# Patient Record
Sex: Male | Born: 1975
Health system: Southern US, Community
[De-identification: ages and names within clinical notes are randomized; demographics above are authoritative.]

## PROBLEM LIST (undated history)

## (undated) ENCOUNTER — Emergency Department (HOSPITAL_COMMUNITY): Admission: EM | Payer: BLUE CROSS/BLUE SHIELD | Source: Home / Self Care

## (undated) DIAGNOSIS — E1110 Type 2 diabetes mellitus with ketoacidosis without coma: Secondary | ICD-10-CM

## (undated) DIAGNOSIS — Z8614 Personal history of Methicillin resistant Staphylococcus aureus infection: Secondary | ICD-10-CM

## (undated) DIAGNOSIS — M14679 Charcot's joint, unspecified ankle and foot: Secondary | ICD-10-CM

## (undated) DIAGNOSIS — E109 Type 1 diabetes mellitus without complications: Secondary | ICD-10-CM

## (undated) DIAGNOSIS — T8859XA Other complications of anesthesia, initial encounter: Secondary | ICD-10-CM

## (undated) DIAGNOSIS — J45909 Unspecified asthma, uncomplicated: Secondary | ICD-10-CM

## (undated) DIAGNOSIS — I10 Essential (primary) hypertension: Secondary | ICD-10-CM

## (undated) DIAGNOSIS — S83209A Unspecified tear of unspecified meniscus, current injury, unspecified knee, initial encounter: Secondary | ICD-10-CM

## (undated) DIAGNOSIS — J189 Pneumonia, unspecified organism: Secondary | ICD-10-CM

## (undated) DIAGNOSIS — R0602 Shortness of breath: Secondary | ICD-10-CM

## (undated) DIAGNOSIS — K219 Gastro-esophageal reflux disease without esophagitis: Secondary | ICD-10-CM

## (undated) DIAGNOSIS — T4145XA Adverse effect of unspecified anesthetic, initial encounter: Secondary | ICD-10-CM

## (undated) DIAGNOSIS — E7800 Pure hypercholesterolemia, unspecified: Secondary | ICD-10-CM

## (undated) HISTORY — DX: Gastro-esophageal reflux disease without esophagitis: K21.9

## (undated) HISTORY — DX: Type 1 diabetes mellitus without complications: E10.9

## (undated) HISTORY — DX: Type 2 diabetes mellitus with ketoacidosis without coma: E11.10

## (undated) HISTORY — DX: Charcot's joint, unspecified ankle and foot: M14.679

## (undated) HISTORY — PX: KNEE ARTHROSCOPY: SHX127

## (undated) HISTORY — PX: HERNIA REPAIR: SHX51

## (undated) HISTORY — PX: ABDOMINAL SURGERY: SHX537

## (undated) HISTORY — PX: FRACTURE SURGERY: SHX138

## (undated) HISTORY — DX: Essential (primary) hypertension: I10

---

## 2001-12-03 ENCOUNTER — Emergency Department (HOSPITAL_COMMUNITY): Admission: EM | Admit: 2001-12-03 | Discharge: 2001-12-03 | Payer: Self-pay | Admitting: Emergency Medicine

## 2003-05-13 ENCOUNTER — Emergency Department (HOSPITAL_COMMUNITY): Admission: EM | Admit: 2003-05-13 | Discharge: 2003-05-13 | Payer: Self-pay | Admitting: Family Medicine

## 2004-01-13 ENCOUNTER — Encounter: Admission: RE | Admit: 2004-01-13 | Discharge: 2004-04-12 | Payer: Self-pay | Admitting: Family Medicine

## 2004-04-02 ENCOUNTER — Emergency Department (HOSPITAL_COMMUNITY): Admission: EM | Admit: 2004-04-02 | Discharge: 2004-04-02 | Payer: Self-pay | Admitting: Family Medicine

## 2005-11-05 ENCOUNTER — Emergency Department (HOSPITAL_COMMUNITY): Admission: EM | Admit: 2005-11-05 | Discharge: 2005-11-05 | Payer: Self-pay | Admitting: Emergency Medicine

## 2006-01-05 ENCOUNTER — Encounter: Admission: RE | Admit: 2006-01-05 | Discharge: 2006-01-05 | Payer: Self-pay | Admitting: Family Medicine

## 2006-09-18 ENCOUNTER — Encounter: Admission: RE | Admit: 2006-09-18 | Discharge: 2006-09-18 | Payer: Self-pay | Admitting: Orthopedic Surgery

## 2007-06-01 ENCOUNTER — Encounter: Admission: RE | Admit: 2007-06-01 | Discharge: 2007-06-01 | Payer: Self-pay | Admitting: Family Medicine

## 2007-06-16 ENCOUNTER — Encounter: Admission: RE | Admit: 2007-06-16 | Discharge: 2007-06-16 | Payer: Self-pay | Admitting: Family Medicine

## 2007-08-26 ENCOUNTER — Emergency Department (HOSPITAL_COMMUNITY): Admission: EM | Admit: 2007-08-26 | Discharge: 2007-08-26 | Payer: Self-pay | Admitting: Family Medicine

## 2008-11-06 ENCOUNTER — Ambulatory Visit: Payer: Self-pay | Admitting: Vascular Surgery

## 2008-11-06 ENCOUNTER — Ambulatory Visit: Admission: RE | Admit: 2008-11-06 | Discharge: 2008-11-06 | Payer: Self-pay | Admitting: Orthopedic Surgery

## 2008-11-06 ENCOUNTER — Encounter (INDEPENDENT_AMBULATORY_CARE_PROVIDER_SITE_OTHER): Payer: Self-pay | Admitting: Orthopedic Surgery

## 2009-04-30 ENCOUNTER — Emergency Department (HOSPITAL_COMMUNITY): Admission: EM | Admit: 2009-04-30 | Discharge: 2009-04-30 | Payer: Self-pay | Admitting: Emergency Medicine

## 2010-01-31 ENCOUNTER — Encounter: Payer: Self-pay | Admitting: Family Medicine

## 2010-03-21 ENCOUNTER — Inpatient Hospital Stay (INDEPENDENT_AMBULATORY_CARE_PROVIDER_SITE_OTHER)
Admission: RE | Admit: 2010-03-21 | Discharge: 2010-03-21 | Disposition: A | Discharge status: Home / Self Care | Payer: BC Managed Care – PPO | Source: Ambulatory Visit | Attending: Emergency Medicine | Admitting: Emergency Medicine

## 2010-03-21 ENCOUNTER — Ambulatory Visit (INDEPENDENT_AMBULATORY_CARE_PROVIDER_SITE_OTHER): Payer: BC Managed Care – PPO

## 2010-03-21 DIAGNOSIS — S6390XA Sprain of unspecified part of unspecified wrist and hand, initial encounter: Secondary | ICD-10-CM

## 2010-03-30 LAB — POCT H PYLORI SCREEN: H. PYLORI SCREEN, POC: NEGATIVE

## 2010-05-23 ENCOUNTER — Emergency Department (HOSPITAL_COMMUNITY): Payer: BC Managed Care – PPO

## 2010-05-23 ENCOUNTER — Inpatient Hospital Stay (HOSPITAL_COMMUNITY)
Admission: EM | Admit: 2010-05-23 | Discharge: 2010-05-25 | DRG: 295 | Disposition: A | Discharge status: Home / Self Care | Payer: BC Managed Care – PPO | Attending: Internal Medicine | Admitting: Internal Medicine

## 2010-05-23 DIAGNOSIS — E1010 Type 1 diabetes mellitus with ketoacidosis without coma: Principal | ICD-10-CM | POA: Diagnosis present

## 2010-05-23 DIAGNOSIS — Z794 Long term (current) use of insulin: Secondary | ICD-10-CM

## 2010-05-23 DIAGNOSIS — K219 Gastro-esophageal reflux disease without esophagitis: Secondary | ICD-10-CM | POA: Diagnosis present

## 2010-05-23 DIAGNOSIS — E871 Hypo-osmolality and hyponatremia: Secondary | ICD-10-CM | POA: Diagnosis present

## 2010-05-23 DIAGNOSIS — E785 Hyperlipidemia, unspecified: Secondary | ICD-10-CM | POA: Diagnosis present

## 2010-05-23 DIAGNOSIS — D72829 Elevated white blood cell count, unspecified: Secondary | ICD-10-CM | POA: Diagnosis present

## 2010-05-23 LAB — COMPREHENSIVE METABOLIC PANEL
Calcium: 9.4 mg/dL (ref 8.4–10.5)
Chloride: 86 mEq/L — ABNORMAL LOW (ref 96–112)
GFR calc non Af Amer: >60 mL/min (ref >60)
Glucose, Bld: 540 mg/dL — ABNORMAL HIGH (ref 70–99)
Sodium: 129 mEq/L — ABNORMAL LOW (ref 135–145)
Total Bilirubin: 0.4 mg/dL (ref 0.3–1.2)

## 2010-05-23 LAB — LIPID PANEL
Cholesterol: 231 mg/dL — ABNORMAL HIGH (ref 0–200)
Triglycerides: 143 mg/dL (ref <150)
VLDL: 29 mg/dL (ref 0–40)

## 2010-05-23 LAB — URINE MICROSCOPIC-ADD ON

## 2010-05-23 LAB — URINALYSIS, ROUTINE W REFLEX MICROSCOPIC
Bilirubin Urine: NEGATIVE
Hgb urine dipstick: NEGATIVE
Ketones, ur: >80 mg/dL — AB
Leukocytes, UA: NEGATIVE
Protein, ur: 30 mg/dL — AB

## 2010-05-23 LAB — BASIC METABOLIC PANEL
Chloride: 100 mEq/L (ref 96–112)
GFR calc non Af Amer: >60 mL/min (ref >60)
Glucose, Bld: 227 mg/dL — ABNORMAL HIGH (ref 70–99)
Potassium: 5 mEq/L (ref 3.5–5.1)
Sodium: 135 mEq/L (ref 135–145)

## 2010-05-23 LAB — GLUCOSE, CAPILLARY
Glucose-Capillary: 562 mg/dL (ref 70–99)
Glucose-Capillary: 398 mg/dL — ABNORMAL HIGH (ref 70–99)

## 2010-05-23 LAB — CBC
HCT: 43.8 % (ref 39.0–52.0)
Hemoglobin: 14.8 g/dL (ref 13.0–17.0)
MCH: 31.2 pg (ref 26.0–34.0)
MCHC: 33.8 g/dL (ref 30.0–36.0)
MCV: 92.2 fL (ref 78.0–100.0)
Platelets: 284 10*3/uL (ref 150–400)

## 2010-05-23 LAB — LIPASE, BLOOD: Lipase: 10 U/L — ABNORMAL LOW (ref 11–59)

## 2010-05-23 LAB — DIFFERENTIAL
Basophils Absolute: 0 10*3/uL (ref 0.0–0.1)
Basophils Relative: 0 % (ref 0–1)
Eosinophils Absolute: 0 10*3/uL (ref 0.0–0.7)
Eosinophils Relative: 0 % (ref 0–5)
Lymphocytes Relative: 4 % — ABNORMAL LOW (ref 12–46)
Monocytes Relative: 7 % (ref 3–12)
Neutro Abs: 18.4 10*3/uL — ABNORMAL HIGH (ref 1.7–7.7)
Neutrophils Relative %: 89 % — ABNORMAL HIGH (ref 43–77)

## 2010-05-23 LAB — MRSA PCR SCREENING: MRSA by PCR: NEGATIVE

## 2010-05-23 LAB — POCT CARDIAC MARKERS
CKMB, poc: <1 ng/mL — ABNORMAL LOW (ref 1.0–8.0)
Myoglobin, poc: 37.7 ng/mL (ref 12–200)
Troponin i, poc: <0.05 ng/mL (ref 0.00–0.09)

## 2010-05-23 LAB — CK TOTAL AND CKMB (NOT AT ARMC): CK, MB: 1.8 ng/mL (ref 0.3–4.0)

## 2010-05-23 LAB — TROPONIN I: Troponin I: <0.3 ng/mL (ref <0.30)

## 2010-05-23 LAB — POCT I-STAT 3, ART BLOOD GAS (G3+): O2 Saturation: 97 %

## 2010-05-23 LAB — D-DIMER, QUANTITATIVE: D-Dimer, Quant: <0.22 ug/mL-FEU (ref 0.00–0.48)

## 2010-05-23 LAB — RAPID STREP SCREEN (MED CTR MEBANE ONLY): Streptococcus, Group A Screen (Direct): NEGATIVE

## 2010-05-24 ENCOUNTER — Inpatient Hospital Stay (HOSPITAL_COMMUNITY): Payer: BC Managed Care – PPO

## 2010-05-24 LAB — CARDIAC PANEL(CRET KIN+CKTOT+MB+TROPI)
CK, MB: 1.7 ng/mL (ref 0.3–4.0)
Relative Index: INVALID (ref 0.0–2.5)
Troponin I: <0.3 ng/mL (ref <0.30)
Troponin I: <0.3 ng/mL (ref <0.30)

## 2010-05-24 LAB — GLUCOSE, CAPILLARY
Glucose-Capillary: 189 mg/dL — ABNORMAL HIGH (ref 70–99)
Glucose-Capillary: 191 mg/dL — ABNORMAL HIGH (ref 70–99)
Glucose-Capillary: 210 mg/dL — ABNORMAL HIGH (ref 70–99)
Glucose-Capillary: 173 mg/dL — ABNORMAL HIGH (ref 70–99)
Glucose-Capillary: 127 mg/dL — ABNORMAL HIGH (ref 70–99)
Glucose-Capillary: 127 mg/dL — ABNORMAL HIGH (ref 70–99)
Glucose-Capillary: 160 mg/dL — ABNORMAL HIGH (ref 70–99)
Glucose-Capillary: 186 mg/dL — ABNORMAL HIGH (ref 70–99)
Glucose-Capillary: 144 mg/dL — ABNORMAL HIGH (ref 70–99)

## 2010-05-24 LAB — BASIC METABOLIC PANEL
BUN: 12 mg/dL (ref 6–23)
BUN: 18 mg/dL (ref 6–23)
BUN: 10 mg/dL (ref 6–23)
CO2: 17 mEq/L — ABNORMAL LOW (ref 19–32)
CO2: 6 mEq/L — CL (ref 19–32)
CO2: 20 mEq/L (ref 19–32)
Calcium: 8.2 mg/dL — ABNORMAL LOW (ref 8.4–10.5)
Chloride: 103 mEq/L (ref 96–112)
Chloride: 103 mEq/L (ref 96–112)
Chloride: 106 mEq/L (ref 96–112)
Creatinine, Ser: 1.02 mg/dL (ref 0.4–1.5)
Creatinine, Ser: 0.84 mg/dL (ref 0.4–1.5)
Creatinine, Ser: 1.18 mg/dL (ref 0.4–1.5)
GFR calc Af Amer: >60 mL/min (ref >60)
GFR calc Af Amer: >60 mL/min (ref >60)
GFR calc non Af Amer: >60 mL/min (ref >60)
Glucose, Bld: 153 mg/dL — ABNORMAL HIGH (ref 70–99)
Glucose, Bld: 193 mg/dL — ABNORMAL HIGH (ref 70–99)
Potassium: 3.6 mEq/L (ref 3.5–5.1)
Sodium: 133 mEq/L — ABNORMAL LOW (ref 135–145)

## 2010-05-24 LAB — CBC
HCT: 39.5 % (ref 39.0–52.0)
Hemoglobin: 14 g/dL (ref 13.0–17.0)
RBC: 4.45 MIL/uL (ref 4.22–5.81)

## 2010-05-24 LAB — HEMOGLOBIN A1C: Mean Plasma Glucose: 295 mg/dL — ABNORMAL HIGH (ref <117)

## 2010-05-24 LAB — MAGNESIUM: Magnesium: 2.2 mg/dL (ref 1.5–2.5)

## 2010-05-24 LAB — URINE CULTURE
Colony Count: NO GROWTH
Culture: NO GROWTH

## 2010-05-25 LAB — GLUCOSE, CAPILLARY
Glucose-Capillary: 263 mg/dL — ABNORMAL HIGH (ref 70–99)
Glucose-Capillary: 291 mg/dL — ABNORMAL HIGH (ref 70–99)

## 2010-05-25 LAB — BASIC METABOLIC PANEL
Chloride: 100 mEq/L (ref 96–112)
Creatinine, Ser: 0.85 mg/dL (ref 0.4–1.5)
GFR calc Af Amer: >60 mL/min (ref >60)

## 2010-05-25 LAB — CBC
MCH: 31.4 pg (ref 26.0–34.0)
Platelets: 194 10*3/uL (ref 150–400)
RBC: 4.33 MIL/uL (ref 4.22–5.81)
WBC: 4.9 10*3/uL (ref 4.0–10.5)

## 2010-05-27 NOTE — H&P (Signed)
NAME:  Michael Hutchinson, Michael Hutchinson NO.:  [id number]  MEDICAL RECORD NO.:  [id number]           PATIENT TYPE:  E  LOCATION:  [facility]                         FACILITY:  [facility]  PHYSICIAN:  Eduard Clos, MDDATE OF BIRTH:  1975-12-29  DATE OF ADMISSION:  05/23/2010 DATE OF DISCHARGE:                             HISTORY & PHYSICAL   PRIMARY CARE PHYSICIAN:  Gretta Arab. Valentina Lucks, MD  CHIEF COMPLAINT:  Nausea, vomiting, and chest pain.  HISTORY OF PRESENT ILLNESS:  A 35 year old male with history of diabetes mellitus type 1 since age 53, has been experiencing some nausea, vomiting and multiple episodes of sneezing with chest pain since morning.  The chest pain is on left side pleuritic in nature, this has become persistent.  The patient denies any abdominal pain or diarrhea.  Denies any fever, chills, cough, or phlegm.  Has the feeling was sore throat like.  Denies any dizziness, loss of consciousness, or any focal deficit.  Denies any dysuria, discharge, or diarrhea.  In the ER, the patient was found to be in DKA with anion gap of 38 with a bicarb of 5.  ABG shows the pH of 7.1.  At this time, the patient is admitted for DKA.  His cardiac enzymes have been negative.  EKG shows nonspecific finding.  PAST MEDICAL HISTORY:  Diabetes mellitus type 1.  PAST SURGICAL HISTORY:  Arthroscopic procedure for left knee.  ADMITTING MEDICATIONS:  Lantus insulin 48-50 units at bedtime, but at times takes early in the morning.  SOCIAL HISTORY:  The patient lives with his parents.  Denies smoking cigarette, drinking alcohol or use illegal drugs.  ALLERGIES:  No known drug allergies.  FAMILY HISTORY:  Significant for diabetes in grandparents.  REVIEW OF SYSTEMS:  As per history of present illness, nothing else significant.  PHYSICAL EXAMINATION:  GENERAL:  The patient is examined at bedside, not in acute distress. VITAL SIGNS:  Blood pressure is 132/44, pulse 120 per  minute, temperature 98.7, respirations 22 breaths, and O2 sat is 100%. HEENT:  Anicteric.  No pallor or discharge from ears, eyes, or mouth.  I do not see any obvious exudates in the throat.  Tongue is midline and dry.  No facial asymmetry.  No neck vein JVD.  No discharge from ears, eyes, nose, or mouth. CHEST:  Bilateral air entry present.  No rhonchi.  No crepitation. HEART:  S1, S2 heard. ABDOMEN:  Soft, nontender.  Bowel sounds heard. NEUROLOGIC:  The patient is alert, awake, and oriented to time an person.  Moves upper and lower extremities 5/5.  Peripheral pulses good. No edema.  LABORATORY DATA:  EKG shows normal sinus rhythm with heart rate around 127 beats per minute with nonspecific ST changes with R-wave progression.  ABG; pH is 7.103, pCO2 of 13.9, pO2 is 116, oxygen saturation is 97%.  CBC; WBC is 20.6, hemoglobin is 14.8, hematocrit 43.8, and platelets 284, neutrophils 89%.  Complete metabolic panel; sodium 129, potassium 5.4, chloride 86, carbon dioxide 5, glucose 540, BUN 23, creatinine 1.1, total bilirubin is 0.45, phosphatase 136.  AST 15, ALT 15, total protein 7.7, albumin  4.3, calcium 9.9, lipase 10, CK- MB less than 1, troponin less than 505, myoglobin 37.7.  ASSESSMENT: 1. Diabetic ketoacidosis. 2. Atypical chest pain, pleuritic in nature. 3. Hyponatremia probably from dehydration and hyperglycemia.  No     nausea or vomiting.  PLAN: 1. At this time, we will admit the patient to Intensive Care Unit. 2. For his DKA, the patient will be kept n.p.o.  We will follow DKA     protocol with aggressive hydration and IV insulin per protocol with     frequent B-MET check and correction of his potassium accordingly. 3. For his nausea and vomiting at this time we will place the patient     on antiemetics.  The abdomen appears benign and he is not     complaining of any abdominal pain.  If he still has persistent     nausea and vomiting at this time, we will consider  CAT scan. 4. Leukocytosis that seems to be from a stress with DKA.  The patient     does complain of multiple healing episodes with some sore throat,     "I think I have got a strep throat."  I think the patient is also     having pleuritic chest pain, am not sure if he had aspirated or     not.  At this time, I will place the patient empirically on     clindamycin.  I am going to repeat the chest x-ray in a.m.  Based     on his clinical condition, if he needs to be continued on     clindamycin or not. 5. For his chest pain which at this time is atypical, we will try the     cardiac markers and we will get 2-D echo.     Eduard Clos, MD     ANK/MEDQ  D:  05/23/2010  T:  05/23/2010  Job:  [id number]  cc:   Gretta Arab. Valentina Lucks, M.D.  Electronically Signed by Midge Minium MD on 05/27/2010 07:16:20 AM

## 2010-05-30 LAB — CULTURE, BLOOD (ROUTINE X 2)
Culture  Setup Time: 201205140002
Culture: NO GROWTH
Culture: NO GROWTH

## 2010-06-03 NOTE — Discharge Summary (Signed)
NAME:  Michael Hutchinson, Michael Hutchinson NO.:  [id number]  MEDICAL RECORD NO.:  [id number]           PATIENT TYPE:  I  LOCATION:  4508                         FACILITY:  [facility]  PHYSICIAN:  Jeoffrey Massed, MD    DATE OF BIRTH:  [birth year]  DATE OF ADMISSION:  05/23/2010 DATE OF DISCHARGE:                        DISCHARGE SUMMARY - REFERRING   PRIMARY CARE PRACTITIONER:  Gretta Arab. Valentina Lucks, MD  PRIMARY DISCHARGE DIAGNOSES: 1. Diabetic ketoacidosis, now resolved. 2. Gastroesophageal reflux disease. 3. Dyslipidemia.  DISCHARGE DIAGNOSIS:  Type 2 diabetes.  DISCHARGE MEDICATIONS: 1. Crestor 10 mg 1 tablet p.o. daily at bedtime. 2. Protonix 40 mg 1 tablet p.o. daily. 3. Lantus 48-50 units subcutaneously at bedtime. 4. NovoLog insulin sliding scale three times a day as needed.  CONSULTATIONS:  None.  BRIEF HISTORY OF PRESENT ILLNESS:  The patient is a very pleasant 35- year-old male who presented with nausea, vomiting and chest pain who was found to have diabetic ketoacidosis with the ABG showing a pH of 7.1. The patient was then admitted to the ICU for further treatment.  For further details, please see the history and physical that was dictated by Dr. Toniann Fail on admission.  PERTINENT LABORATORY DATA: 1. HbA1c is 11.9. 2. Urine cultures negative. 3. Rapid strep screen was negative. 4. Lipid profile showed an LDL cholesterol of 164. 5. D-dimer was negative. 6. Blood cultures are negative so far.  PERTINENT RADIOLOGICAL STUDIES:  Portable chest x-ray showed no active disease of the chest.  BRIEF HOSPITAL COURSE: 1. Diabetic ketoacidosis.  The patient was admitted with nausea and     vomiting along with chest pain.  Further evaluation showed that     this patient to be in severe diabetic ketoacidosis.  The patient     was hydrated, put on an insulin drip and admitted to the ICU.  With     fluids and with insulin drip, the patient's condition improved.  He     was  also briefly kept n.p.o. and given supportive therapy with     antiemetics for his nausea and vomiting.  He was slowly started on     clear liquid diet and advance as tolerated.  Subsequent to his     anion gap closing, his insulin drip was discontinued and the     patient was then transitioned to subcutaneous insulin along with     sliding scale coverage.  Overnight, he has done well and is now     tolerating a regular diet with no further nausea, vomiting.  His     chest pain has also resolved.  The patient's HbA1c is significantly     elevated and he will follow up with Dr. Valentina Lucks in an outpatient     setting for further optimization of his insulin regimen. 2. Dyslipidemia.  His LDL cholesterol is elevated and he will now be     started on Crestor as noted above. 3. Per the patient and the patient's family, the patient has been     having a lot of reflux like symptoms over the past few weeks and he  will now be started on Protonix.  Further workup including GI     consultation will be referred to the discretion of his primary care    practitioner.  DISPOSITION:  This patient is stable to be discharged home.  FOLLOWUP INSTRUCTIONS:  The patient has been instructed to follow up with Dr. Valentina Lucks within 5 days upon discharge.  He is to call and make an appointment.  Total time spent coordinating discharge 45 minutes.     Jeoffrey Massed, MD     SG/MEDQ  D:  05/25/2010  T:  05/25/2010  Job:  [id number]  cc:   Gretta Arab. Valentina Lucks, M.D.  Electronically Signed by Jeoffrey Massed  on 06/03/2010 02:17:50 PM

## 2010-11-11 ENCOUNTER — Encounter: Payer: Self-pay | Admitting: Gastroenterology

## 2010-11-24 ENCOUNTER — Encounter: Payer: Self-pay | Admitting: *Deleted

## 2010-11-30 ENCOUNTER — Telehealth: Payer: Self-pay | Admitting: Gastroenterology

## 2010-11-30 ENCOUNTER — Ambulatory Visit: Payer: BC Managed Care – PPO | Admitting: Gastroenterology

## 2010-11-30 NOTE — Telephone Encounter (Signed)
Per Dr Jarold Motto - Do Not Reschedule with him/yf

## 2010-12-09 ENCOUNTER — Encounter (HOSPITAL_COMMUNITY): Payer: Self-pay | Admitting: Emergency Medicine

## 2010-12-09 ENCOUNTER — Emergency Department (INDEPENDENT_AMBULATORY_CARE_PROVIDER_SITE_OTHER)
Admission: EM | Admit: 2010-12-09 | Discharge: 2010-12-09 | Disposition: A | Discharge status: Home / Self Care | Payer: BC Managed Care – PPO | Source: Home / Self Care | Attending: Emergency Medicine | Admitting: Emergency Medicine

## 2010-12-09 DIAGNOSIS — M25562 Pain in left knee: Secondary | ICD-10-CM

## 2010-12-09 DIAGNOSIS — M25569 Pain in unspecified knee: Secondary | ICD-10-CM

## 2010-12-09 HISTORY — DX: Unspecified tear of unspecified meniscus, current injury, unspecified knee, initial encounter: S83.209A

## 2010-12-09 MED ORDER — IBUPROFEN 600 MG PO TABS: 600.0000 mg | ORAL_TABLET | Freq: Four times a day (QID) | ORAL | Status: AC | PRN

## 2010-12-09 MED ORDER — HYDROCODONE-ACETAMINOPHEN 5-325 MG PO TABS: 2.0000 | ORAL_TABLET | ORAL | Status: AC | PRN

## 2010-12-09 NOTE — ED Notes (Signed)
Pain in leg (left) .  Left knee pain last night, pain running down anterior lower leg.  Some pain in bend of stomach,  No new injury.  Has had pain in the past, but today worse than usual.  Today pain has extended in anterior thigh.

## 2010-12-09 NOTE — ED Provider Notes (Addendum)
History     CSN: [id number] Arrival date & time: 12/09/2010  9:21 PM   First MD Initiated Contact with Patient 12/09/10 2058      Chief Complaint  Patient presents with  . Leg Pain    HPI Comments: Pt s/p meniscal injury left knee with arthroscopy reports knee "giving out" several days ago. Noticed swelling, pain starting last night. No other recent trauma to knee. No change in physical activity.  No redness, fevers, parasthesias, crepitus.  Pain worse with walking. Tried tylenol with some relief. No h/o anticoagulant or antiplatelet use. Similar pain before prior to meniscal debridement. Glucose running at baseline per pt.  Patient is a 35 y.o. male presenting with leg pain. The history is provided by the patient.  Leg Pain  The incident occurred yesterday. There was no injury mechanism. The pain is present in the left knee. The quality of the pain is described as throbbing. The pain has been constant since onset. Associated symptoms include muscle weakness. Pertinent negatives include no numbness, no inability to bear weight, no loss of motion, no loss of sensation and no tingling. He reports no foreign bodies present. The symptoms are aggravated by activity, bearing weight and palpation. He has tried acetaminophen for the symptoms. The treatment provided moderate relief.    Past Medical History  Diagnosis Date  . Diabetes mellitus   . Diabetic ketoacidosis   . GERD (gastroesophageal reflux disease)   . Meniscus tear     Past Surgical History  Procedure Date  . Knee arthroscopy     History reviewed. No pertinent family history.  History  Substance Use Topics  . Smoking status: Never Smoker   . Smokeless tobacco: Not on file  . Alcohol Use: No      Review of Systems  Constitutional: Negative for fever.  Gastrointestinal: Negative for nausea and vomiting.  Musculoskeletal: Positive for joint swelling, arthralgias and gait problem. Negative for back pain.  Skin:  Negative for color change and rash.  Neurological: Negative for tingling, weakness and numbness.  Hematological: Does not bruise/bleed easily.    Allergies  Review of patient's allergies indicates no known allergies.  Home Medications   Current Outpatient Rx  Name Route Sig Dispense Refill  . ACETAMINOPHEN 500 MG PO TABS Oral Take 500 mg by mouth every 6 (six) hours as needed.      . INSULIN GLARGINE 100 UNIT/ML [place] SOLN Subcutaneous Inject into the skin at bedtime.      . INSULIN LISPRO (HUMAN) 100 UNIT/ML [place] SOLN Subcutaneous Inject into the skin 3 (three) times daily before meals.      Marland Kitchen HYDROCODONE-ACETAMINOPHEN 5-325 MG PO TABS Oral Take 2 tablets by mouth every 4 (four) hours as needed for pain. 20 tablet 0  . IBUPROFEN 600 MG PO TABS Oral Take 1 tablet (600 mg total) by mouth every 6 (six) hours as needed for pain. 30 tablet 0    BP 114/68  Pulse 108  Temp(Src) 98.2 F (36.8 C) (Oral)  Resp 20  SpO2 98%  Physical Exam  Nursing note and vitals reviewed. Constitutional: He is oriented to person, place, and time. He appears well-developed and well-nourished.  HENT:  Head: Normocephalic and atraumatic.  Eyes: Conjunctivae and EOM are normal. Pupils are equal, round, and reactive to light.  Neck: Normal range of motion.  Cardiovascular: Regular rhythm.   Pulmonary/Chest: Effort normal. No respiratory distress.  Abdominal: He exhibits no distension.  Musculoskeletal: Normal range of motion.  Trace effusion.. No warmth, redness. Left Knee ROM slightly decreased due to pain, Flexion/extension  intact. , Patella NT, Patellar apprehension test negative, Patellar tendon NT, Medial joint NT, Lateral joint  tender, Popliteal region NT, Lachman's stable, Varus stress testing stable but painful, Valgus stress testing stable, McMurray's testing abnormal, distal NVI with intact baseline sensation / motor / pulse distal to knee.   Neurological: He is alert and oriented to person,  place, and time.  Skin: Skin is warm and dry.  Psychiatric: He has a normal mood and affect. His behavior is normal.    ED Course  Procedures (including critical care time)  Labs Reviewed - No data to display No results found.   1. Knee pain, left       MDM  No evidence of septic arthritis. H/o meniscal injury in this knee s/p debridement. H&p most c/w repeat injury. No bony tenderness or history concerning for fx. Quadriceps mildly tender but pt with antalgic gait suspect altered gait mechanics causing thigh pain. Trace effusion. Joint stable. Does not meet [place] criteria for imaging. Deferring imaging. Will immobilize, ice, rest, crutches WBAT, nsaids/ norco prn, will f/u with Dr. Sherlean Foot who originally did surgery on pt's knee.   Luiz Blare, MD 12/09/10 4782  Luiz Blare, MD 12/09/10 [phone]

## 2010-12-20 ENCOUNTER — Emergency Department (INDEPENDENT_AMBULATORY_CARE_PROVIDER_SITE_OTHER)
Admission: EM | Admit: 2010-12-20 | Discharge: 2010-12-20 | Disposition: A | Discharge status: Home / Self Care | Payer: BC Managed Care – PPO | Source: Home / Self Care | Attending: Emergency Medicine | Admitting: Emergency Medicine

## 2010-12-20 ENCOUNTER — Emergency Department (INDEPENDENT_AMBULATORY_CARE_PROVIDER_SITE_OTHER): Payer: BC Managed Care – PPO

## 2010-12-20 ENCOUNTER — Encounter (HOSPITAL_COMMUNITY): Payer: Self-pay | Admitting: Emergency Medicine

## 2010-12-20 DIAGNOSIS — R6889 Other general symptoms and signs: Secondary | ICD-10-CM

## 2010-12-20 MED ORDER — TRAMADOL HCL 50 MG PO TABS: 100.0000 mg | ORAL_TABLET | Freq: Three times a day (TID) | ORAL | Status: AC | PRN

## 2010-12-20 MED ORDER — PREDNISONE 10 MG PO TABS: ORAL_TABLET | ORAL | Status: DC

## 2010-12-20 MED ORDER — GUAIFENESIN-CODEINE 100-10 MG/5ML PO SYRP: 10.0000 mL | ORAL_SOLUTION | Freq: Four times a day (QID) | ORAL | Status: AC | PRN

## 2010-12-20 NOTE — ED Provider Notes (Signed)
History     CSN: [id number] Arrival date & time: 12/20/2010  7:15 PM   First MD Initiated Contact with Patient 12/20/10 1824      Chief Complaint  Patient presents with  . URI    (Consider location/radiation/quality/duration/timing/severity/associated sxs/prior treatment) HPI Comments: Michael Hutchinson is a 35 year old insulin requiring diabetic who has had a four-day history of chest congestion, dry cough, wheezing, aching the chest, nasal congestion, headache, hoarseness, fever up to 103, and sweats. He has not had a flu vaccine. He's been exposed to his daughter who has a flulike illness. He's tried Alka-Seltzer and Delsym without relief. He rates his pain as a 6/10.  Patient is a 35 y.o. male presenting with URI.  URI The primary symptoms include fever and cough. Primary symptoms do not include fatigue, ear pain, sore throat, wheezing, abdominal pain, nausea, vomiting or rash.  Symptoms associated with the illness include congestion. The illness is not associated with chills or rhinorrhea.    Past Medical History  Diagnosis Date  . Diabetes mellitus   . Diabetic ketoacidosis   . GERD (gastroesophageal reflux disease)   . Meniscus tear     Past Surgical History  Procedure Date  . Knee arthroscopy     History reviewed. No pertinent family history.  History  Substance Use Topics  . Smoking status: Never Smoker   . Smokeless tobacco: Not on file  . Alcohol Use: No      Review of Systems  Constitutional: Positive for fever and diaphoresis. Negative for chills and fatigue.  HENT: Positive for congestion and voice change. Negative for ear pain, sore throat, rhinorrhea, sneezing, neck stiffness and postnasal drip.   Eyes: Negative for pain, discharge and redness.  Respiratory: Positive for cough. Negative for chest tightness, shortness of breath and wheezing.   Cardiovascular: Positive for chest pain.  Gastrointestinal: Negative for nausea, vomiting, abdominal pain and  diarrhea.  Skin: Negative for rash.    Allergies  Review of patient's allergies indicates no known allergies.  Home Medications   Current Outpatient Rx  Name Route Sig Dispense Refill  . ACETAMINOPHEN 500 MG PO TABS Oral Take 500 mg by mouth every 6 (six) hours as needed.      . INSULIN GLARGINE 100 UNIT/ML [place] SOLN Subcutaneous Inject 48 Units into the skin at bedtime.     . GUAIFENESIN-CODEINE 100-10 MG/5ML PO SYRP Oral Take 10 mLs by mouth 4 (four) times daily as needed for cough. 120 mL 0  . HYDROCODONE-ACETAMINOPHEN 5-325 MG PO TABS Oral Take 2 tablets by mouth every 4 (four) hours as needed for pain. 20 tablet 0  . IBUPROFEN 600 MG PO TABS Oral Take 1 tablet (600 mg total) by mouth every 6 (six) hours as needed for pain. 30 tablet 0  . INSULIN LISPRO (HUMAN) 100 UNIT/ML  SOLN Subcutaneous Inject into the skin 3 (three) times daily before meals.      . TRAMADOL HCL 50 MG PO TABS Oral Take 2 tablets (100 mg total) by mouth every 8 (eight) hours as needed for pain. Maximum dose= 8 tablets per day 30 tablet 0    BP 136/84  Pulse 108  Temp(Src) 98.9 F (37.2 C) (Oral)  Resp 18  SpO2 98%  Physical Exam  Nursing note and vitals reviewed. Constitutional: He appears well-developed and well-nourished. No distress.  HENT:  Head: Normocephalic and atraumatic.  Right Ear: External ear normal.  Left Ear: External ear normal.  Nose: Nose normal.  Mouth/Throat: Oropharynx is clear  and moist. No oropharyngeal exudate.  Eyes: Conjunctivae and EOM are normal. Pupils are equal, round, and reactive to light. Right eye exhibits no discharge. Left eye exhibits no discharge.  Neck: Normal range of motion. Neck supple.  Cardiovascular: Normal rate, regular rhythm and normal heart sounds.   Pulmonary/Chest: Effort normal and breath sounds normal. No stridor. No respiratory distress. He has no wheezes. He has no rales. He exhibits no tenderness.  Lymphadenopathy:    He has no cervical  adenopathy.  Skin: Skin is warm and dry. No rash noted. He is not diaphoretic.    ED Course  Procedures (including critical care time)  Labs Reviewed - No data to display Dg Chest 2 View  12/20/2010  *RADIOLOGY REPORT*  Clinical Data: Cough and fever  CHEST - 2 VIEW  Comparison: Chest radiograph 05/24/2010  Findings: The heart, mediastinal, and hilar contours are within normal limits and stable.  Trachea is midline.  Lung volumes slightly low.  No focal airspace disease, edema, effusion, or pneumothorax.  Bony thorax is unremarkable.  IMPRESSION: No acute cardiopulmonary disease.  Original Report Authenticated By: Britta Mccreedy, M.D.     1. Flu-like symptoms       MDM  He appears to have a flulike illness and he has not gotten the flu vaccine. His chest x-ray was negative, so will treat symptomatically.        Roque Lias, MD 12/20/10 [phone]

## 2010-12-20 NOTE — ED Notes (Signed)
Pt here with uri sx that started 3 dys ago.sx dry cough intermitt with chest congestion,loss of voice and h/a.pt has been taking otc alka seltzer,desylm but not working.temp on Friday 103.0 and resolved with tylenol

## 2010-12-22 ENCOUNTER — Ambulatory Visit: Payer: Worker's Compensation

## 2011-04-12 ENCOUNTER — Encounter (HOSPITAL_COMMUNITY): Payer: Self-pay | Admitting: Emergency Medicine

## 2011-04-12 ENCOUNTER — Emergency Department (HOSPITAL_COMMUNITY)
Admission: EM | Admit: 2011-04-12 | Discharge: 2011-04-12 | Disposition: A | Discharge status: Home / Self Care | Payer: BC Managed Care – PPO | Source: Home / Self Care | Attending: Emergency Medicine | Admitting: Emergency Medicine

## 2011-04-12 DIAGNOSIS — L02412 Cutaneous abscess of left axilla: Secondary | ICD-10-CM

## 2011-04-12 DIAGNOSIS — L0390 Cellulitis, unspecified: Secondary | ICD-10-CM

## 2011-04-12 DIAGNOSIS — IMO0002 Reserved for concepts with insufficient information to code with codable children: Secondary | ICD-10-CM

## 2011-04-12 DIAGNOSIS — L0291 Cutaneous abscess, unspecified: Secondary | ICD-10-CM

## 2011-04-12 MED ORDER — DOXYCYCLINE HYCLATE 100 MG PO CAPS: 100.0000 mg | ORAL_CAPSULE | Freq: Two times a day (BID) | ORAL | Status: DC

## 2011-04-12 NOTE — ED Notes (Signed)
Patient reports cbg this am 170"s.  It's "high" this morning.

## 2011-04-12 NOTE — ED Notes (Signed)
Knot under left axilla.  Does not have a pcp.  Patient reports knot started draining last night.  Some pain.  Reports " oozing yellow , nasty bad stuff".  History of the same.

## 2011-04-12 NOTE — ED Provider Notes (Signed)
History     CSN: [id number]  Arrival date & time 04/12/11  5621   First MD Initiated Contact with Patient 04/12/11 1015      Chief Complaint  Patient presents with  . Arm Pain    (Consider location/radiation/quality/duration/timing/severity/associated sxs/prior treatment) HPI Comments: Patient has been having swelling and a boil on his right armpit for more than a week, has been gotten progressively worse and painful and tender. Yesterday he decided to scrub the area vigorously and was able to open the abscess to drain and abundant pus today the swelling has come down markedly. Still soreness to losing pus with that order. No fevers. Patient describes have had similar infections in the past as he has diabetes.  Patient is a 36 y.o. male presenting with arm pain.  Arm Pain This is a new problem. The current episode started more than 1 week ago. The problem occurs constantly. The problem has been rapidly improving. Pertinent negatives include no chest pain. The symptoms are aggravated by twisting. The symptoms are relieved by rest. The treatment provided no relief.    Past Medical History  Diagnosis Date  . Diabetes mellitus   . Diabetic ketoacidosis   . GERD (gastroesophageal reflux disease)   . Meniscus tear     Past Surgical History  Procedure Date  . Knee arthroscopy     No family history on file.  History  Substance Use Topics  . Smoking status: Never Smoker   . Smokeless tobacco: Not on file  . Alcohol Use: No      Review of Systems  Constitutional: Negative for fever, chills and fatigue.  Cardiovascular: Negative for chest pain.  Skin: Negative for color change and rash.    Allergies  Review of patient's allergies indicates no known allergies.  Home Medications   Current Outpatient Rx  Name Route Sig Dispense Refill  . ACETAMINOPHEN 500 MG PO TABS Oral Take 500 mg by mouth every 6 (six) hours as needed.      Marland Kitchen DOXYCYCLINE HYCLATE 100 MG PO CAPS Oral  Take 1 capsule (100 mg total) by mouth 2 (two) times daily. 20 capsule 0  . INSULIN GLARGINE 100 UNIT/ML [place] SOLN Subcutaneous Inject 48 Units into the skin at bedtime.     . INSULIN LISPRO (HUMAN) 100 UNIT/ML [place] SOLN Subcutaneous Inject into the skin 3 (three) times daily before meals.        BP 122/73  Pulse 117  Temp(Src) 98.5 F (36.9 C) (Oral)  Resp 14  SpO2 100%  Physical Exam  Nursing note and vitals reviewed. Constitutional: He appears well-developed and well-nourished. He appears distressed.  HENT:  Head: Normocephalic.  Eyes: Conjunctivae are normal.  Neck: Neck supple.  Pulmonary/Chest: Effort normal.  Musculoskeletal: He exhibits tenderness.  Neurological: He is alert.  Skin: No rash noted. There is erythema.       ED Course  Procedures (including critical care time)  Labs Reviewed - No data to display No results found.   1. Abscess of axilla, left   2. Cellulitis       MDM  Left axillary abscess with spontaneous drainage. With digital pressure was able to evacuate abscess with abundant purulent exudate. Cellulitic component noted as well patient instructed to start with antibiotics        Jimmie Molly, MD 04/12/11 1058

## 2011-04-12 NOTE — Discharge Instructions (Signed)
Abscess An abscess (boil or furuncle) is an infected area under your skin. This area is filled with yellowish white fluid (pus). HOME CARE   Only take medicine as told by your doctor.   Keep the skin clean around your abscess. Keep clothes that may touch the abscess clean.   Change any bandages (dressings) as told by your doctor.   Avoid direct skin contact with other people. The infection can spread by skin contact with others.   Practice good hygiene and do not share personal care items.   Do not share athletic equipment, towels, or whirlpools. Shower after every practice or work out session.   If a draining area cannot be covered:   Do not play sports.   Children should not go to daycare until the wound has healed or until fluid (drainage) stops coming out of the wound.   See your doctor for a follow-up visit as told.  GET HELP RIGHT AWAY IF:   There is more pain, puffiness (swelling), and redness in the wound site.   There is fluid or bleeding from the wound site.   You have muscle aches, chills, fever, or feel sick.   You or your child has a temperature by mouth above 102 F (38.9 C), not controlled by medicine.   Your baby is older than 3 months with a rectal temperature of 102 F (38.9 C) or higher.  MAKE SURE YOU:   Understand these instructions.   Will watch your condition.   Will get help right away if you are not doing well or get worse.  Document Released: 06/15/2007 Document Revised: 12/16/2010 Document Reviewed: 06/15/2007 Exit[employer] Patient IAbscess An abscess (boil or furuncle) is an infected area that contains a collection of pus.  SYMPTOMS Signs and symptoms of an abscess include pain, tenderness, redness, or hardness. You may feel a moveable soft area under your skin. An abscess can occur anywhere in the body.  TREATMENT  A surgical cut (incision) may be made over your abscess to drain the pus. Gauze may be packed into the space or a drain may be  looped through the abscess cavity (pocket). This provides a drain that will allow the cavity to heal from the inside outwards. The abscess may be painful for a few days, but should feel much better if it was drained.  Your abscess, if seen early, may not have localized and may not have been drained. If not, another appointment may be required if it does not get better on its own or with medications. HOME CARE INSTRUCTIONS   Only take over-the-counter or prescription medicines for pain, discomfort, or fever as directed by your caregiver.   Take your antibiotics as directed if they were prescribed. Finish them even if you start to feel better.   Keep the skin and clothes clean around your abscess.   If the abscess was drained, you will need to use gauze dressing to collect any draining pus. Dressings will typically need to be changed 3 or more times a day.   The infection may spread by skin contact with others. Avoid skin contact as much as possible.   Practice good hygiene. This includes regular hand washing, cover any draining skin lesions, and do not share personal care items.   If you participate in sports, do not share athletic equipment, towels, whirlpools, or personal care items. Shower after every practice or tournament.   If a draining area cannot be adequately covered:   Do not participate in  sports.   Children should not participate in day care until the wound has healed or drainage stops.   If your caregiver has given you a follow-up appointment, it is very important to keep that appointment. Not keeping the appointment could result in a much worse infection, chronic or permanent injury, pain, and disability. If there is any problem keeping the appointment, you must call back to this facility for assistance.  SEEK MEDICAL CARE IF:   You develop increased pain, swelling, redness, drainage, or bleeding in the wound site.   You develop signs of generalized infection including  muscle aches, chills, fever, or a general ill feeling.   You have an oral temperature above 102 F (38.9 C).  MAKE SURE YOU:   Understand these instructions.   Will watch your condition.   Will get help right away if you are not doing well or get worse.  Document Released: 10/06/2004 Document Revised: 12/16/2010 Document Reviewed: 07/31/2007 [facility] Patient Information [address]nformation 2012 [place], [place].Abscess An abscess (boil or furuncle) is an infected area that contains a collection of pus.  SYMPTOMS Signs and symptoms of an abscess include pain, tenderness, redness, or hardness. You may feel a moveable soft area under your skin. An abscess can occur anywhere in the body.  TREATMENT  A surgical cut (incision) may be made over your abscess to drain the pus. Gauze may be packed into the space or a drain may be looped through the abscess cavity (pocket). This provides a drain that will allow the cavity to heal from the inside outwards. The abscess may be painful for a few days, but should feel much better if it was drained.  Your abscess, if seen early, may not have localized and may not have been drained. If not, another appointment may be required if it does not get better on its own or with medications. HOME CARE INSTRUCTIONS   Only take over-the-counter or prescription medicines for pain, discomfort, or fever as directed by your caregiver.   Take your antibiotics as directed if they were prescribed. Finish them even if you start to feel better.   Keep the skin and clothes clean around your abscess.   If the abscess was drained, you will need to use gauze dressing to collect any draining pus. Dressings will typically need to be changed 3 or more times a day.   The infection may spread by skin contact with others. Avoid skin contact as much as possible.   Practice good hygiene. This includes regular hand washing, cover any draining skin lesions, and do not share  personal care items.   If you participate in sports, do not share athletic equipment, towels, whirlpools, or personal care items. Shower after every practice or tournament.   If a draining area cannot be adequately covered:   Do not participate in sports.   Children should not participate in day care until the wound has healed or drainage stops.   If your caregiver has given you a follow-up appointment, it is very important to keep that appointment. Not keeping the appointment could result in a much worse infection, chronic or permanent injury, pain, and disability. If there is any problem keeping the appointment, you must call back to this facility for assistance.  SEEK MEDICAL CARE IF:   You develop increased pain, swelling, redness, drainage, or bleeding in the wound site.   You develop signs of generalized infection including muscle aches, chills, fever, or a general ill feeling.  You have an oral temperature above 102 F (38.9 C).  MAKE SURE YOU:   Understand these instructions.   Will watch your condition.   Will get help right away if you are not doing well or get worse.  Document Released: 10/06/2004 Document Revised: 12/16/2010 Document Reviewed: 07/31/2007 [facility] Patient Information 2012 [place], [place].

## 2011-04-21 ENCOUNTER — Emergency Department (INDEPENDENT_AMBULATORY_CARE_PROVIDER_SITE_OTHER)
Admission: EM | Admit: 2011-04-21 | Discharge: 2011-04-21 | Disposition: A | Discharge status: Home / Self Care | Payer: BC Managed Care – PPO | Source: Home / Self Care | Attending: Emergency Medicine | Admitting: Emergency Medicine

## 2011-04-21 ENCOUNTER — Encounter (HOSPITAL_COMMUNITY): Payer: Self-pay

## 2011-04-21 DIAGNOSIS — L0291 Cutaneous abscess, unspecified: Secondary | ICD-10-CM

## 2011-04-21 MED ORDER — SULFAMETHOXAZOLE-TRIMETHOPRIM 800-160 MG PO TABS: 1.0000 | ORAL_TABLET | Freq: Two times a day (BID) | ORAL | Status: AC

## 2011-04-21 NOTE — ED Provider Notes (Signed)
Medical screening examination/treatment/procedure(s) were performed by the resident and as supervising physician I was immediately available for consultation/collaboration.  Luiz Blare MD   Luiz Blare, MD 04/21/11 1048

## 2011-04-21 NOTE — ED Notes (Signed)
Pt states seen here a week ago for abscess to lt axilla that is draining.  Reports we put him on antibiotics, today is the last day of treatment and it is still draining large amounts of greenish-yellow pus.  No noted improvement.

## 2011-04-21 NOTE — ED Provider Notes (Signed)
History     CSN: [id number]  Arrival date & time 04/21/11  0865   First MD Initiated Contact with Patient 04/21/11 0831      Chief Complaint  Patient presents with  . Abscess    (Consider location/radiation/quality/duration/timing/severity/associated sxs/prior treatment) Patient is a 36 y.o. male presenting with abscess. The history is provided by the patient.  Abscess    patient was initially seen on 4/2 by Dr. Ladon Applebaum for this complaint.  His axillary abscess was drained at that time and doxycycline was given for 10 days. He has been washing the area with soap and water and changing his bandages daily.  He is here today because it has continued to drain pus. He thinks that the pus drainage is about the same as it was previously. It is the same throughout the day. The pain has not increased. He denies fevers, N/V, rash or increasing redness around the site.  He says it has a foul odor.  He is diabetic and his CBGs have been higher recently.  His AM CBG yesterday was 160.  This is followed by his PCP.  Past Medical History  Diagnosis Date  . Diabetes mellitus   . Diabetic ketoacidosis   . GERD (gastroesophageal reflux disease)   . Meniscus tear     Past Surgical History  Procedure Date  . Knee arthroscopy     No family history on file.  History  Substance Use Topics  . Smoking status: Never Smoker   . Smokeless tobacco: Not on file  . Alcohol Use: No      Review of Systems Denies CP, SOB, HA, N/V/D, fever  Allergies  Review of patient's allergies indicates no known allergies.  Home Medications   Current Outpatient Rx  Name Route Sig Dispense Refill  . INSULIN GLARGINE 100 UNIT/ML [place] SOLN Subcutaneous Inject 48 Units into the skin at bedtime.     . INSULIN LISPRO (HUMAN) 100 UNIT/ML [place] SOLN Subcutaneous Inject into the skin 3 (three) times daily before meals.      . ACETAMINOPHEN 500 MG PO TABS Oral Take 500 mg by mouth every 6 (six) hours as needed.      .  SULFAMETHOXAZOLE-TRIMETHOPRIM 800-160 MG PO TABS Oral Take 1 tablet by mouth 2 (two) times daily. 20 tablet 0    BP 120/82  Pulse 109  Temp(Src) 98.2 F (36.8 C) (Oral)  Resp 14  SpO2 97%  Physical Exam  Constitutional: He appears well-developed and well-nourished. He does not appear ill. No distress.  Pulmonary/Chest: Effort normal.  Skin: Skin is warm. He is not diaphoretic.       ED Course  Procedures (including critical care time)  Skin abscess   MDM   1. skin abscess, not improved-patient has a skin abscess which is continued to drain. It is fully open and draining well. There is no evidence of an underlying abscess that is not open for drainage. He is not febrile or in significant pain. I will change him to Bactrim for another 10 days. He is to return to see [place] in one week to check for improvement. This abscess was complicated by his diabetes. His diabetes is treated by his PCP and has been fairly well controlled prior to this.      Reginold Agent, MD 04/21/11 1012

## 2011-04-21 NOTE — Discharge Instructions (Signed)
Please continue to wash this area with soap and water Change the bandage frequently Please return for recheck in 1 week Return sooner if you develop fevers or you worsen

## 2011-04-29 ENCOUNTER — Emergency Department (INDEPENDENT_AMBULATORY_CARE_PROVIDER_SITE_OTHER)
Admission: EM | Admit: 2011-04-29 | Discharge: 2011-04-29 | Disposition: A | Discharge status: Home / Self Care | Payer: BC Managed Care – PPO | Source: Home / Self Care | Attending: Emergency Medicine | Admitting: Emergency Medicine

## 2011-04-29 ENCOUNTER — Encounter (HOSPITAL_COMMUNITY): Payer: Self-pay

## 2011-04-29 DIAGNOSIS — T148XXA Other injury of unspecified body region, initial encounter: Secondary | ICD-10-CM

## 2011-04-29 DIAGNOSIS — L089 Local infection of the skin and subcutaneous tissue, unspecified: Secondary | ICD-10-CM

## 2011-04-29 NOTE — Discharge Instructions (Signed)
Return as necessary continue with daily dressing changes.

## 2011-04-29 NOTE — ED Notes (Signed)
Seen here previously for abscess lt axilla.  Here today for recheck.  Has 2 days of antibiotic remaining.  States area is much better, still draining small amts.

## 2011-04-29 NOTE — ED Provider Notes (Signed)
History     CSN: [id number]  Arrival date & time 04/29/11  3086   First MD Initiated Contact with Patient 04/29/11 0827      Chief Complaint  Patient presents with  . Follow-up    (Consider location/radiation/quality/duration/timing/severity/associated sxs/prior treatment) HPI Comments: Still draining some material, but tenderness and swelling has improved, an doing much better"  The history is provided by the patient.    Past Medical History  Diagnosis Date  . Diabetes mellitus   . Diabetic ketoacidosis   . GERD (gastroesophageal reflux disease)   . Meniscus tear     Past Surgical History  Procedure Date  . Knee arthroscopy     No family history on file.  History  Substance Use Topics  . Smoking status: Never Smoker   . Smokeless tobacco: Not on file  . Alcohol Use: No      Review of Systems  Constitutional: Negative for fever, activity change and fatigue.    Allergies  Review of patient's allergies indicates no known allergies.  Home Medications   Current Outpatient Rx  Name Route Sig Dispense Refill  . INSULIN GLARGINE 100 UNIT/ML [place] SOLN Subcutaneous Inject 48 Units into the skin at bedtime.     . INSULIN LISPRO (HUMAN) 100 UNIT/ML [place] SOLN Subcutaneous Inject into the skin 3 (three) times daily before meals.      . SULFAMETHOXAZOLE-TRIMETHOPRIM 800-160 MG PO TABS Oral Take 1 tablet by mouth 2 (two) times daily. 20 tablet 0  . ACETAMINOPHEN 500 MG PO TABS Oral Take 500 mg by mouth every 6 (six) hours as needed.        BP 121/86  Pulse 96  Temp 98.9 F (37.2 C)  Resp 16  SpO2 98%  Physical Exam  Nursing note and vitals reviewed. Constitutional: He appears well-developed and well-nourished.  Skin: There is erythema.       ED Course  Procedures (including critical care time)  Labs Reviewed - No data to display No results found.   1. Wound infection       MDM  L axillary resolving - post-I*D        Jimmie Molly,  MD 04/29/11 [phone]

## 2011-07-12 ENCOUNTER — Encounter (HOSPITAL_COMMUNITY): Payer: Self-pay | Admitting: Emergency Medicine

## 2011-07-12 ENCOUNTER — Encounter (HOSPITAL_COMMUNITY): Payer: Self-pay | Admitting: *Deleted

## 2011-07-12 ENCOUNTER — Emergency Department (HOSPITAL_COMMUNITY)
Admission: EM | Admit: 2011-07-12 | Discharge: 2011-07-12 | Disposition: A | Discharge status: Nursing Home (Includes ICF) | Payer: BC Managed Care – PPO | Source: Home / Self Care | Attending: Emergency Medicine | Admitting: Emergency Medicine

## 2011-07-12 ENCOUNTER — Emergency Department (HOSPITAL_COMMUNITY)
Admission: EM | Admit: 2011-07-12 | Discharge: 2011-07-12 | Disposition: A | Discharge status: Home / Self Care | Payer: BC Managed Care – PPO | Attending: Emergency Medicine | Admitting: Emergency Medicine

## 2011-07-12 DIAGNOSIS — E119 Type 2 diabetes mellitus without complications: Secondary | ICD-10-CM | POA: Insufficient documentation

## 2011-07-12 DIAGNOSIS — K219 Gastro-esophageal reflux disease without esophagitis: Secondary | ICD-10-CM | POA: Insufficient documentation

## 2011-07-12 DIAGNOSIS — L02412 Cutaneous abscess of left axilla: Secondary | ICD-10-CM

## 2011-07-12 DIAGNOSIS — L0291 Cutaneous abscess, unspecified: Secondary | ICD-10-CM

## 2011-07-12 DIAGNOSIS — L02414 Cutaneous abscess of left upper limb: Secondary | ICD-10-CM

## 2011-07-12 DIAGNOSIS — IMO0002 Reserved for concepts with insufficient information to code with codable children: Secondary | ICD-10-CM

## 2011-07-12 DIAGNOSIS — L0390 Cellulitis, unspecified: Secondary | ICD-10-CM

## 2011-07-12 DIAGNOSIS — Z794 Long term (current) use of insulin: Secondary | ICD-10-CM | POA: Insufficient documentation

## 2011-07-12 LAB — CBC WITH DIFFERENTIAL/PLATELET
Basophils Absolute: 0 10*3/uL (ref 0.0–0.1)
Basophils Relative: 0 % (ref 0–1)
Hemoglobin: 14 g/dL (ref 13.0–17.0)
MCHC: 34.6 g/dL (ref 30.0–36.0)
Neutro Abs: 10.9 10*3/uL — ABNORMAL HIGH (ref 1.7–7.7)
Neutrophils Relative %: 81 % — ABNORMAL HIGH (ref 43–77)
RDW: 12 % (ref 11.5–15.5)

## 2011-07-12 LAB — POCT I-STAT, CHEM 8
Chloride: 97 mEq/L (ref 96–112)
HCT: 43 % (ref 39.0–52.0)
Potassium: 3.8 mEq/L (ref 3.5–5.1)

## 2011-07-12 LAB — GLUCOSE, CAPILLARY: Glucose-Capillary: 224 mg/dL — ABNORMAL HIGH (ref 70–99)

## 2011-07-12 MED ORDER — CLINDAMYCIN HCL 150 MG PO CAPS: 150.0000 mg | ORAL_CAPSULE | Freq: Four times a day (QID) | ORAL | Status: DC

## 2011-07-12 MED ORDER — CLINDAMYCIN PHOSPHATE 600 MG/50ML IV SOLN
600.0000 mg | Freq: Once | INTRAVENOUS | Status: AC
  Administered 2011-07-12: 600 mg via INTRAVENOUS
  Filled 2011-07-12: qty 50

## 2011-07-12 MED ORDER — SODIUM CHLORIDE 0.9 % IV BOLUS (SEPSIS)
1000.0000 mL | Freq: Once | INTRAVENOUS | Status: AC
  Administered 2011-07-12: 1000 mL via INTRAVENOUS

## 2011-07-12 MED ORDER — ACETAMINOPHEN 325 MG PO TABS
650.0000 mg | ORAL_TABLET | Freq: Once | ORAL | Status: AC
  Administered 2011-07-12: 650 mg via ORAL
  Filled 2011-07-12: qty 2

## 2011-07-12 NOTE — ED Notes (Signed)
Received care of patient. No c/o pain. DIscused plan of care with him.

## 2011-07-12 NOTE — ED Provider Notes (Signed)
Medical screening examination/treatment/procedure(s) were conducted as a shared visit with non-physician practitioner(s) and myself.  I personally evaluated the patient during the encounter   Michael Ducharme B. Bernette Mayers, MD 07/12/11 1547

## 2011-07-12 NOTE — ED Provider Notes (Signed)
3:20 PM Signout from [place], [place] in CDU. Pt with abscess under L arm, on cellulitis protocol (clindamycin). Pt's abscess has been drained.  7:30 PM The overlying cellulitis appears improved at this time. On my exam, the patient's pulse is 96. Patient has received one dose of IV antibiotics. He is very anxious to be discharged. He prefers not to wait for a second dose of antibiotics which would be due at 9:30 PM. He will be discharged home with a prescription for clindamycin. He was given wound care instructions. He understands that he is to return in 2 days for a wound recheck. Reasons to return sooner such as fever, spreading cellulitis discussed. He verbalized understanding and agreed to this plan.  Remiel Corti, PA-C 07/12/11 2005

## 2011-07-12 NOTE — ED Provider Notes (Signed)
Medical screening examination/treatment/procedure(s) were conducted as a shared visit with non-physician practitioner(s) and myself.  I personally evaluated the patient during the encounter   Curly Mackowski B. Bernette Mayers, MD 07/12/11 1536

## 2011-07-12 NOTE — ED Notes (Signed)
Expecting an appt soon with penny jones/[facility]

## 2011-07-12 NOTE — ED Provider Notes (Signed)
Patient is a 36 y/o male in the CDU with a history of multiple axillary and inguinal abscesses required incision and drainage. Today he presents with large abscess under his right axilla, erythematous, swollen, with drainage from multiple sites. Incision and drainage was done by Pixie Casino, PA-C with assistance from [facility] [address], [place]. A moderate amount of pus was expressed and the wound was successfully packed. Patient will receive IV Clindamycin and will most like be discharged on home Clindamycin. Patient will return to the ER in two days for a recheck.     INCISION AND DRAINAGE Performed by: Pixie Casino Consent: Verbal consent obtained. Risks and benefits: risks, benefits and alternatives were discussed Type: abscess  Body area: Right axilla.  Anesthesia: local infiltration  Local anesthetic: lidocaine 2% no epi  Anesthetic total: 6 ml  Complexity: complex Blunt dissection to break up loculations  Drainage: purulent  Drainage amount: moderate  Packing material: 1/4 in iodoform gauze  Patient tolerance: Patient tolerated the procedure well with no immediate complications.     Pixie Casino, PA-C 07/12/11 1545

## 2011-07-12 NOTE — ED Provider Notes (Signed)
3:33 PM Patient moved to CDU under observation, cellulitis protocol.  This is a shared visit with Dr Bernette Mayers.  Pt moved to CDU for I&D of abscess, several rounds of clindamycin.  Anticipate d/c home with clindamycin, return in two days for wound recheck, packing removal.  I&D performed by Pixie Casino, PA-C.  Pt discussed with Grant Fontana, PA-C, who assumes care of patient at change of shift.    Dillard Cannon [place], [place] 07/12/11 1535

## 2011-07-12 NOTE — ED Notes (Signed)
Reports knot under left arm, suspects he is getting one under right arm.  Onset last week .  History of the same

## 2011-07-12 NOTE — ED Provider Notes (Signed)
History     CSN: [id number]  Arrival date & time 07/12/11  1100   First MD Initiated Contact with Patient 07/12/11 1105      Chief Complaint  Patient presents with  . Abscess    (Consider location/radiation/quality/duration/timing/severity/associated sxs/prior treatment) HPI Comments: New abscess for about 1 week on L armpit, having other areas of infection as well, under his L armpit, Have done two recent antibiotic cycles within the last few months.Sharp pain and increased swelling mainly L armpit.  Patient is a 36 y.o. male presenting with abscess. The history is provided by the patient.  Abscess  This is a new problem. The current episode started more than one week ago. The problem occurs occasionally. Affected Location: left axilla. The problem is moderate. The abscess is characterized by redness, draining and swelling. The abscess first occurred at home. Associated symptoms include a fever. Pertinent negatives include no anorexia, no rhinorrhea and no sore throat.    Past Medical History  Diagnosis Date  . Diabetes mellitus   . Diabetic ketoacidosis   . GERD (gastroesophageal reflux disease)   . Meniscus tear     Past Surgical History  Procedure Date  . Knee arthroscopy     No family history on file.  History  Substance Use Topics  . Smoking status: Never Smoker   . Smokeless tobacco: Not on file  . Alcohol Use: No      Review of Systems  Constitutional: Positive for fever. Negative for chills and appetite change.  HENT: Negative for sore throat and rhinorrhea.   Gastrointestinal: Negative for anorexia.  Skin: Negative for wound.  Neurological: Negative for dizziness.    Allergies  Review of patient's allergies indicates no known allergies.  Home Medications   Current Outpatient Rx  Name Route Sig Dispense Refill  . INSULIN GLARGINE 100 UNIT/ML [place] SOLN Subcutaneous Inject 45 Units into the skin at bedtime.     . INSULIN LISPRO (HUMAN) 100 UNIT/ML [place]  SOLN Subcutaneous Inject 3-20 Units into the skin 3 (three) times daily before meals. Per sliding scale.      BP 142/94  Pulse 128  Temp 100 F (37.8 C) (Oral)  Resp 20  SpO2 98%  Physical Exam  Nursing note and vitals reviewed. Constitutional: He appears well-developed and well-nourished.  Non-toxic appearance. He does not have a sickly appearance. He does not appear ill. No distress.  Cardiovascular: Tachycardia present.   Musculoskeletal: He exhibits tenderness.  Neurological: He is alert.  Skin: Rash noted. There is erythema.       ED Course  Procedures (including critical care time)  Labs Reviewed - No data to display No results found.   1. Abscess of arm, left   2. Cellulitis and abscess       MDM  Recurrent abscesses- new event 68 week old- febrile. Transfered to the ED- Same axillary abscess as treated on two-different occassions, PATIENT might benefit form some IV antibiotics, as patent have completed to different cycles with both doxycyline and bactrim.        Jimmie Molly, MD 07/12/11 [phone]

## 2011-07-12 NOTE — ED Notes (Signed)
Pt seen today at [facility] and told to come to ED for low grade fever (100.0) and elevated BP. Pt states that he has "been messing with it and made it ooze." Area is in left axilla, is red and has yellow/white head. Area has small amt of yellow drainage. Pt denies any other complaints at this time.

## 2011-07-12 NOTE — ED Provider Notes (Signed)
History     CSN: [id number]  Arrival date & time 07/12/11  1157   First MD Initiated Contact with Patient 07/12/11 1337      Chief Complaint  Patient presents with  . Fever  . Wound Infection    (Consider location/radiation/quality/duration/timing/severity/associated sxs/prior treatment) HPI Pt reports several days of increasing pain and swelling in his L axilla. Associated with moderate aching pain and drainage of pus. He has had abscess in that area before. Seen at [facility] where he was found to have low grade fever and tachycardia. He denies any CP, SOB, cough, N/V/D. He has had relative good blood sugars recently as well.   Past Medical History  Diagnosis Date  . Diabetes mellitus   . Diabetic ketoacidosis   . GERD (gastroesophageal reflux disease)   . Meniscus tear     Past Surgical History  Procedure Date  . Knee arthroscopy     No family history on file.  History  Substance Use Topics  . Smoking status: Never Smoker   . Smokeless tobacco: Not on file  . Alcohol Use: No      Review of Systems All other systems reviewed and are negative except as noted in HPI.   Allergies  Review of patient's allergies indicates no known allergies.  Home Medications   Current Outpatient Rx  Name Route Sig Dispense Refill  . INSULIN GLARGINE 100 UNIT/ML [place] SOLN Subcutaneous Inject 45 Units into the skin at bedtime.     . INSULIN LISPRO (HUMAN) 100 UNIT/ML [place] SOLN Subcutaneous Inject 3-20 Units into the skin 3 (three) times daily before meals. Per sliding scale.      BP 145/93  Pulse 138  Temp 99.4 F (37.4 C)  Resp 20  Ht 6' (1.829 m)  Wt 260 lb (117.935 kg)  BMI 35.26 kg/m2  SpO2 100%  Physical Exam  Nursing note and vitals reviewed. Constitutional: He is oriented to person, place, and time. He appears well-developed and well-nourished.  HENT:  Head: Normocephalic and atraumatic.  Eyes: EOM are normal. Pupils are equal, round, and reactive to light.  Neck:  Normal range of motion. Neck supple.  Cardiovascular: Normal rate, normal heart sounds and intact distal pulses.   Pulmonary/Chest: Effort normal and breath sounds normal.  Abdominal: Bowel sounds are normal. He exhibits no distension. There is no tenderness.  Musculoskeletal: Normal range of motion. He exhibits no edema and no tenderness.  Neurological: He is alert and oriented to person, place, and time. He has normal strength. No cranial nerve deficit or sensory deficit.  Skin: Skin is warm and dry. Rash noted.       Multiple skin tags in L axilla, there is a large fluctuant mass also that is tender to palpation and draining some purulent material  Psychiatric: He has a normal mood and affect.    ED Course  Procedures (including critical care time)  Labs Reviewed  GLUCOSE, CAPILLARY - Abnormal; Notable for the following:    Glucose-Capillary 224 (*)     All other components within normal limits  CBC WITH DIFFERENTIAL   No results found.   No diagnosis found.    MDM  Will place IV, give IVF, antipyretics and IV Abx for large abscess in diabetic. Move to CDU for further eval and drainage. Discussed with Trixie Dredge, PA       Bonnita Levan. Bernette Mayers, MD 07/12/11 [phone]

## 2011-07-12 NOTE — ED Notes (Signed)
Patient with abcess under his left arm .  He noted a little pain x 1 week but it became worse.  He also has a fever today.   Patient was seen at urgent care and sent to ED for further eval and tx

## 2011-07-13 NOTE — ED Provider Notes (Signed)
Medical screening examination/treatment/procedure(s) were performed by non-physician practitioner and as supervising physician I was immediately available for consultation/collaboration.  Andrika Peraza R. Janasia Coverdale, MD 07/13/11 0015 

## 2011-07-14 ENCOUNTER — Emergency Department (HOSPITAL_COMMUNITY)
Admission: EM | Admit: 2011-07-14 | Discharge: 2011-07-14 | Disposition: A | Discharge status: Home / Self Care | Payer: BC Managed Care – PPO | Attending: Emergency Medicine | Admitting: Emergency Medicine

## 2011-07-14 ENCOUNTER — Encounter (HOSPITAL_COMMUNITY): Payer: Self-pay | Admitting: Emergency Medicine

## 2011-07-14 DIAGNOSIS — Z794 Long term (current) use of insulin: Secondary | ICD-10-CM | POA: Insufficient documentation

## 2011-07-14 DIAGNOSIS — E119 Type 2 diabetes mellitus without complications: Secondary | ICD-10-CM | POA: Insufficient documentation

## 2011-07-14 DIAGNOSIS — K219 Gastro-esophageal reflux disease without esophagitis: Secondary | ICD-10-CM | POA: Insufficient documentation

## 2011-07-14 DIAGNOSIS — IMO0002 Reserved for concepts with insufficient information to code with codable children: Secondary | ICD-10-CM | POA: Insufficient documentation

## 2011-07-14 DIAGNOSIS — L02412 Cutaneous abscess of left axilla: Secondary | ICD-10-CM

## 2011-07-14 MED ORDER — HYDROCODONE-ACETAMINOPHEN 5-325 MG PO TABS: 1.0000 | ORAL_TABLET | ORAL | Status: AC | PRN

## 2011-07-14 NOTE — ED Notes (Signed)
PT. REQUESTING PACKING REMOVAL AT RIGHT AXILLA ABSCESS .

## 2011-07-14 NOTE — ED Provider Notes (Signed)
Medical screening examination/treatment/procedure(s) were performed by non-physician practitioner and as supervising physician I was immediately available for consultation/collaboration.  Cheri Guppy, MD 07/14/11 [phone]

## 2011-07-14 NOTE — ED Provider Notes (Signed)
History     CSN: [id number]  Arrival date & time 07/14/11  2956   First MD Initiated Contact with Patient 07/14/11 [phone]      Chief Complaint  Patient presents with  . Wound Check    (Consider location/radiation/quality/duration/timing/severity/associated sxs/prior treatment) Patient is a 36 y.o. male presenting with wound check. The history is provided by the patient.  Wound Check  He was treated in the ED 2 to 3 days ago. Previous treatment in the ED includes I&D of abscess and IV/IM antibiotics. Treatments since wound repair include oral antibiotics. Fever duration: no fever. There has been colored discharge from the wound. The redness has improved. The swelling has improved. The pain has improved. He has no difficulty moving the affected extremity or digit.  Left axillary abscess.   Past Medical History  Diagnosis Date  . Diabetes mellitus   . Diabetic ketoacidosis   . GERD (gastroesophageal reflux disease)   . Meniscus tear     Past Surgical History  Procedure Date  . Knee arthroscopy     No family history on file.  History  Substance Use Topics  . Smoking status: Never Smoker   . Smokeless tobacco: Not on file  . Alcohol Use: No      Review of Systems  Constitutional: Negative for fever and chills.  Skin: Positive for color change and wound.  Neurological: Negative for weakness and numbness.    Allergies  Review of patient's allergies indicates no known allergies.  Home Medications   Current Outpatient Rx  Name Route Sig Dispense Refill  . CLINDAMYCIN HCL 150 MG PO CAPS Oral Take 1 capsule (150 mg total) by mouth every 6 (six) hours. 28 capsule 0  . INSULIN GLARGINE 100 UNIT/ML [place] SOLN Subcutaneous Inject 45 Units into the skin at bedtime.     . INSULIN LISPRO (HUMAN) 100 UNIT/ML [place] SOLN Subcutaneous Inject 3-20 Units into the skin 3 (three) times daily before meals. Per sliding scale.      BP 130/76  Pulse 115  Temp 99 F (37.2 C) (Oral)  Resp 18   SpO2 98%  Physical Exam  Nursing note and vitals reviewed. Constitutional: He appears well-developed and well-nourished. No distress.  HENT:  Head: Normocephalic and atraumatic.       MMM  Eyes: Conjunctivae are normal.  Neck: Neck supple.  Cardiovascular:       Tachycardia, regular rhythm.  Pulmonary/Chest: Effort normal. No respiratory distress.  Neurological: He is alert.  Skin: Skin is warm and dry.       Left axilla with multiple skin tags. 6cm wide area of induration with central fluctuance, prior incision site seen with packing in place, small amt purulent drainage seen at incision site. Mildly TTP.    ED Course  Procedures (including critical care time) INCISION AND DRAINAGE Performed by: Lorenz Coaster Consent: Verbal consent obtained. Risks and benefits: risks, benefits and alternatives were discussed Type: abscess  Body area: Left axilla  Anesthesia: none   Complexity: complex Blunt dissection to break up loculations  Drainage: purulent  Drainage amount: copious  Packing material: 1/4 in iodoform gauze  Patient tolerance: Patient tolerated the procedure well with no immediate complications.     Labs Reviewed - No data to display No results found.   Dx 1: Abscess and cellulitis left axilla   MDM  Old packing removed, prior incision site not lengthened, sig loculation breakup was performed with blunt dissection and return of copious purulent material. New packing placed.  Cellulitis improved per pt. Pt afebrile.  Tachycardic on initial check and at time of my PE, though on recheck after abscess drainage heart rate 74. He will be d/c home at this time, will return in 2 days for recheck. Will continue PO abx.       [address] [place], [place] 07/14/11 [phone]

## 2011-07-14 NOTE — ED Notes (Signed)
Discharge home with written and verbal instructions.  Will return here in 2 days for recheck.

## 2011-07-16 ENCOUNTER — Emergency Department (HOSPITAL_COMMUNITY)
Admission: EM | Admit: 2011-07-16 | Discharge: 2011-07-16 | Disposition: A | Discharge status: Home / Self Care | Payer: BC Managed Care – PPO | Attending: Emergency Medicine | Admitting: Emergency Medicine

## 2011-07-16 ENCOUNTER — Encounter (HOSPITAL_COMMUNITY): Payer: Self-pay | Admitting: *Deleted

## 2011-07-16 DIAGNOSIS — K219 Gastro-esophageal reflux disease without esophagitis: Secondary | ICD-10-CM | POA: Insufficient documentation

## 2011-07-16 DIAGNOSIS — E119 Type 2 diabetes mellitus without complications: Secondary | ICD-10-CM | POA: Insufficient documentation

## 2011-07-16 DIAGNOSIS — IMO0002 Reserved for concepts with insufficient information to code with codable children: Secondary | ICD-10-CM | POA: Insufficient documentation

## 2011-07-16 DIAGNOSIS — Z4800 Encounter for change or removal of nonsurgical wound dressing: Secondary | ICD-10-CM | POA: Insufficient documentation

## 2011-07-16 DIAGNOSIS — Z5189 Encounter for other specified aftercare: Secondary | ICD-10-CM

## 2011-07-16 NOTE — ED Provider Notes (Addendum)
History     CSN: [id number]  Arrival date & time 07/16/11  0705   First MD Initiated Contact with Patient 07/16/11 [phone]      Chief Complaint  Patient presents with  . Follow-up    (Consider location/radiation/quality/duration/timing/severity/associated sxs/prior treatment) HPI  36yoM history of diabetes presents for wound check. The patient had a left axillary abscess. He was seen and evaluated in the emergency department on 07/12/2011 and had an incision and drainage performed at that time. He states he's been doing well at home taking Norco for pain and clindamycin. Denies fevers, chills. His glucose has been reasonably controlled--170s. He is having no significant pain at this time. He states that "whebever come to the hospital the heart rate goes up because I feel anxious"   ED Notes, ED Provider Notes from 07/16/11 0000 to 07/16/11 07:16:38       Melissa Loyal Gambler, RN 07/16/2011 07:16      Pt here for packing removal to right axillary, had packing placed 4 days ago and then replaced 2 days ago. Has been on antibiotics and pain medication has been taking as rx'd.     Past Medical History  Diagnosis Date  . Diabetes mellitus   . Diabetic ketoacidosis   . GERD (gastroesophageal reflux disease)   . Meniscus tear     Past Surgical History  Procedure Date  . Knee arthroscopy     History reviewed. No pertinent family history.  History  Substance Use Topics  . Smoking status: Never Smoker   . Smokeless tobacco: Not on file  . Alcohol Use: No      Review of Systems  All other systems reviewed and are negative.   except as noted HPI   Allergies  Review of patient's allergies indicates no known allergies.  Home Medications   Current Outpatient Rx  Name Route Sig Dispense Refill  . CALCIUM CARBONATE ANTACID 500 MG PO CHEW Oral Chew 2 tablets by mouth every 4 (four) hours as needed. Acid reflux    . CLINDAMYCIN HCL 150 MG PO CAPS Oral Take 1 capsule (150 mg  total) by mouth every 6 (six) hours. 28 capsule 0  . HYDROCODONE-ACETAMINOPHEN 5-325 MG PO TABS Oral Take 1-2 tablets by mouth every 4 (four) hours as needed for pain. 12 tablet 0  . INSULIN GLARGINE 100 UNIT/ML [place] SOLN Subcutaneous Inject 45 Units into the skin at bedtime.     . INSULIN LISPRO (HUMAN) 100 UNIT/ML [place] SOLN Subcutaneous Inject 3-20 Units into the skin 3 (three) times daily before meals. Sliding scale per carb count 4 units for every 30 carbs      BP 140/95  Pulse 108  Temp 98.1 F (36.7 C) (Oral)  Resp 16  SpO2 98%  Physical Exam  Nursing note and vitals reviewed. Constitutional: He is oriented to person, place, and time. He appears well-developed and well-nourished. No distress.  HENT:  Head: Atraumatic.  Mouth/Throat: Oropharynx is clear and moist.  Eyes: Conjunctivae are normal.  Neck: Neck supple.  Cardiovascular: Normal rate, regular rhythm, normal heart sounds and intact distal pulses.  Exam reveals no gallop and no friction rub.   No murmur heard. Pulmonary/Chest: Effort normal. No respiratory distress. He has no wheezes. He has no rales.  Abdominal: Soft. There is no tenderness. There is no rebound and no guarding.  Musculoskeletal: Normal range of motion.  Neurological: He is alert and oriented to person, place, and time.  Skin: Skin is warm and dry.  Lt axillary abscess with cont drainage  Psychiatric: He has a normal mood and affect.    ED Course  Procedures (including critical care time)  Labs Reviewed - No data to display No results found.   1. Wound check, abscess     MDM  Doing well at home. Plans to repack given cont pus drainage and f/u in 2 days for recheck PMD/UCC/ED.        Forbes Cellar, MD 07/16/11 1610  Forbes Cellar, MD 07/16/11 [phone]

## 2011-07-16 NOTE — ED Notes (Signed)
Pt here for packing removal to right axillary, had packing placed 4 days ago and then replaced 2 days ago. Has been on antibiotics and pain medication has been taking as rx'd.

## 2011-07-18 ENCOUNTER — Emergency Department (HOSPITAL_COMMUNITY)
Admission: EM | Admit: 2011-07-18 | Discharge: 2011-07-18 | Disposition: A | Discharge status: Home / Self Care | Payer: BC Managed Care – PPO | Attending: Emergency Medicine | Admitting: Emergency Medicine

## 2011-07-18 ENCOUNTER — Encounter (HOSPITAL_COMMUNITY): Payer: Self-pay | Admitting: *Deleted

## 2011-07-18 DIAGNOSIS — Z5189 Encounter for other specified aftercare: Secondary | ICD-10-CM

## 2011-07-18 DIAGNOSIS — Z09 Encounter for follow-up examination after completed treatment for conditions other than malignant neoplasm: Secondary | ICD-10-CM | POA: Insufficient documentation

## 2011-07-18 DIAGNOSIS — K219 Gastro-esophageal reflux disease without esophagitis: Secondary | ICD-10-CM | POA: Insufficient documentation

## 2011-07-18 DIAGNOSIS — E119 Type 2 diabetes mellitus without complications: Secondary | ICD-10-CM | POA: Insufficient documentation

## 2011-07-18 MED ORDER — CLINDAMYCIN HCL 150 MG PO CAPS: 150.0000 mg | ORAL_CAPSULE | Freq: Four times a day (QID) | ORAL | Status: AC

## 2011-07-18 NOTE — ED Provider Notes (Signed)
History     CSN: [id number]  Arrival date & time 07/18/11  0702   First MD Initiated Contact with Patient 07/18/11 0719      Chief Complaint  Patient presents with  . Wound Check    (Consider location/radiation/quality/duration/timing/severity/associated sxs/prior treatment) HPI The patient presents one week after the initial incision and drainage of a left under arm abscess for a wound check.  He notes that since the initial procedure he is in generally well, with no interval fevers, chills, new redness, pain, edema.  Yesterday the packing fell out spontaneously.  He notes that since that time his continued to keep the area clean and has no new complaints. Past Medical History  Diagnosis Date  . Diabetes mellitus   . Diabetic ketoacidosis   . GERD (gastroesophageal reflux disease)   . Meniscus tear     Past Surgical History  Procedure Date  . Knee arthroscopy     History reviewed. No pertinent family history.  History  Substance Use Topics  . Smoking status: Never Smoker   . Smokeless tobacco: Not on file  . Alcohol Use: No      Review of Systems  All other systems reviewed and are negative.    Allergies  Review of patient's allergies indicates no known allergies.  Home Medications   Current Outpatient Rx  Name Route Sig Dispense Refill  . CALCIUM CARBONATE ANTACID 500 MG PO CHEW Oral Chew 2 tablets by mouth every 4 (four) hours as needed. Acid reflux    . HYDROCODONE-ACETAMINOPHEN 5-325 MG PO TABS Oral Take 1-2 tablets by mouth every 4 (four) hours as needed for pain. 12 tablet 0  . INSULIN GLARGINE 100 UNIT/ML [place] SOLN Subcutaneous Inject 45 Units into the skin at bedtime.     . INSULIN LISPRO (HUMAN) 100 UNIT/ML [place] SOLN Subcutaneous Inject 3-20 Units into the skin 3 (three) times daily before meals. Sliding scale per carb count 4 units for every 30 carbs    . CLINDAMYCIN HCL 150 MG PO CAPS Oral Take 1 capsule (150 mg total) by mouth every 6 (six) hours. 28  capsule 0    BP 146/93  Pulse 107  Temp 98.2 F (36.8 C) (Oral)  Resp 17  SpO2 100%  Physical Exam  Constitutional: He is oriented to person, place, and time. He appears well-developed and well-nourished. No distress.  HENT:  Head: Normocephalic and atraumatic.  Eyes: Conjunctivae are normal.  Pulmonary/Chest: No stridor.  Musculoskeletal:       In the left underarm there is a anterior previously incised abscess with minimal spontaneous drainage through a 2 cm incision.  Mild surrounding erythema, minimal tenderness to palpation.  The remainder of the underarm has numerous skin tags, previously incised areas.  Neurological: He is alert and oriented to person, place, and time.  Skin: Skin is warm and dry.  Psychiatric: He has a normal mood and affect.    ED Course  Procedures (including critical care time)  Labs Reviewed - No data to display No results found.   1. Wound check, abscess       MDM  This patient presents for a wound check from a previously incised abscess in his left underarm.  On exam the patient is in no distress with no concerning signs of systemic infection.  Given the ongoing spontaneous drainage of the infection, his antibiotic course was extended 1 week.  As his packing fell out one day ago, there is little benefit of repeat incising to  allow for new packing placement.  The patient was instructed on wound care, return precautions, the importance of PMD and dermatology followup.    Gerhard Munch, MD 07/18/11 [phone]

## 2011-07-18 NOTE — ED Notes (Signed)
Pt states wants left underarm wound rechecked. Pt states that every 2 days he comes in to get the wound repacked. Pt accidentally unpacked wound in shower yesterday.

## 2011-08-12 ENCOUNTER — Ambulatory Visit: Payer: BC Managed Care – PPO | Attending: Family Medicine | Admitting: Physical Therapy

## 2011-11-21 DIAGNOSIS — Z789 Other specified health status: Secondary | ICD-10-CM | POA: Insufficient documentation

## 2011-12-10 ENCOUNTER — Encounter (HOSPITAL_COMMUNITY): Payer: Self-pay | Admitting: *Deleted

## 2011-12-10 ENCOUNTER — Observation Stay (HOSPITAL_COMMUNITY)
Admission: EM | Admit: 2011-12-10 | Discharge: 2011-12-11 | Disposition: A | Discharge status: Home / Self Care | Payer: BC Managed Care – PPO | Attending: Family Medicine | Admitting: Family Medicine

## 2011-12-10 ENCOUNTER — Other Ambulatory Visit: Payer: Self-pay

## 2011-12-10 ENCOUNTER — Emergency Department (HOSPITAL_COMMUNITY): Payer: BC Managed Care – PPO

## 2011-12-10 DIAGNOSIS — R079 Chest pain, unspecified: Secondary | ICD-10-CM | POA: Insufficient documentation

## 2011-12-10 DIAGNOSIS — E119 Type 2 diabetes mellitus without complications: Secondary | ICD-10-CM | POA: Insufficient documentation

## 2011-12-10 DIAGNOSIS — E1143 Type 2 diabetes mellitus with diabetic autonomic (poly)neuropathy: Secondary | ICD-10-CM

## 2011-12-10 DIAGNOSIS — E669 Obesity, unspecified: Secondary | ICD-10-CM | POA: Insufficient documentation

## 2011-12-10 DIAGNOSIS — Z794 Long term (current) use of insulin: Secondary | ICD-10-CM | POA: Insufficient documentation

## 2011-12-10 DIAGNOSIS — R112 Nausea with vomiting, unspecified: Secondary | ICD-10-CM | POA: Insufficient documentation

## 2011-12-10 DIAGNOSIS — R1110 Vomiting, unspecified: Secondary | ICD-10-CM | POA: Diagnosis present

## 2011-12-10 DIAGNOSIS — R1013 Epigastric pain: Principal | ICD-10-CM | POA: Insufficient documentation

## 2011-12-10 DIAGNOSIS — Z7902 Long term (current) use of antithrombotics/antiplatelets: Secondary | ICD-10-CM | POA: Insufficient documentation

## 2011-12-10 DIAGNOSIS — K219 Gastro-esophageal reflux disease without esophagitis: Secondary | ICD-10-CM | POA: Insufficient documentation

## 2011-12-10 LAB — URINALYSIS, ROUTINE W REFLEX MICROSCOPIC
Ketones, ur: 15 mg/dL — AB
Nitrite: NEGATIVE
Protein, ur: 100 mg/dL — AB
Specific Gravity, Urine: 1.034 — ABNORMAL HIGH (ref 1.005–1.030)
Urobilinogen, UA: 2 mg/dL — ABNORMAL HIGH (ref 0.0–1.0)

## 2011-12-10 LAB — BASIC METABOLIC PANEL
BUN: 11 mg/dL (ref 6–23)
Chloride: 98 mEq/L (ref 96–112)
GFR calc Af Amer: >90 mL/min (ref >90)
Glucose, Bld: 124 mg/dL — ABNORMAL HIGH (ref 70–99)
Potassium: 3.1 mEq/L — ABNORMAL LOW (ref 3.5–5.1)

## 2011-12-10 LAB — CBC
HCT: 42.4 % (ref 39.0–52.0)
Hemoglobin: 15 g/dL (ref 13.0–17.0)
RDW: 12.3 % (ref 11.5–15.5)
WBC: 9.1 10*3/uL (ref 4.0–10.5)

## 2011-12-10 LAB — TROPONIN I: Troponin I: <0.3 ng/mL (ref <0.30)

## 2011-12-10 LAB — HEPATIC FUNCTION PANEL
Alkaline Phosphatase: 93 U/L (ref 39–117)
Bilirubin, Direct: <0.1 mg/dL (ref 0.0–0.3)
Total Bilirubin: 0.4 mg/dL (ref 0.3–1.2)
Total Protein: 7.7 g/dL (ref 6.0–8.3)

## 2011-12-10 LAB — DIFFERENTIAL
Basophils Absolute: 0 10*3/uL (ref 0.0–0.1)
Basophils Relative: 0 % (ref 0–1)
Eosinophils Absolute: 0.1 10*3/uL (ref 0.0–0.7)
Neutrophils Relative %: 71 % (ref 43–77)

## 2011-12-10 LAB — POCT I-STAT TROPONIN I: Troponin i, poc: 0 ng/mL (ref 0.00–0.08)

## 2011-12-10 MED ORDER — PANTOPRAZOLE SODIUM 40 MG PO TBEC
80.0000 mg | DELAYED_RELEASE_TABLET | Freq: Every day | ORAL | Status: DC
  Administered 2011-12-10 – 2011-12-11 (×2): 80 mg via ORAL
  Filled 2011-12-10 (×2): qty 2

## 2011-12-10 MED ORDER — HYDROMORPHONE HCL PF 2 MG/ML IJ SOLN
2.0000 mg | Freq: Once | INTRAMUSCULAR | Status: AC
  Administered 2011-12-10: 2 mg via INTRAVENOUS
  Filled 2011-12-10: qty 1

## 2011-12-10 MED ORDER — SODIUM CHLORIDE 0.9 % IJ SOLN: 3.0000 mL | Freq: Two times a day (BID) | INTRAMUSCULAR | Status: DC

## 2011-12-10 MED ORDER — INSULIN GLARGINE 100 UNIT/ML ~~LOC~~ SOLN: 45.0000 [IU] | Freq: Every day | SUBCUTANEOUS | Status: DC

## 2011-12-10 MED ORDER — HYDROMORPHONE HCL PF 1 MG/ML IJ SOLN: 0.5000 mg | INTRAMUSCULAR | Status: DC | PRN

## 2011-12-10 MED ORDER — SODIUM CHLORIDE 0.9 % IV SOLN: 250.0000 mL | INTRAVENOUS | Status: DC | PRN

## 2011-12-10 MED ORDER — ONDANSETRON 4 MG PO TBDP
4.0000 mg | ORAL_TABLET | Freq: Once | ORAL | Status: AC
  Administered 2011-12-10: 4 mg via ORAL
  Filled 2011-12-10: qty 1

## 2011-12-10 MED ORDER — SODIUM CHLORIDE 0.9 % IV SOLN
INTRAVENOUS | Status: DC
  Administered 2011-12-10: 17:00:00 via INTRAVENOUS

## 2011-12-10 MED ORDER — ATORVASTATIN CALCIUM 40 MG PO TABS
40.0000 mg | ORAL_TABLET | Freq: Every day | ORAL | Status: DC
  Administered 2011-12-11: 40 mg via ORAL
  Filled 2011-12-10: qty 1

## 2011-12-10 MED ORDER — ONDANSETRON HCL 4 MG/2ML IJ SOLN: 4.0000 mg | Freq: Four times a day (QID) | INTRAMUSCULAR | Status: DC | PRN

## 2011-12-10 MED ORDER — GI COCKTAIL ~~LOC~~
30.0000 mL | Freq: Three times a day (TID) | ORAL | Status: DC | PRN
  Administered 2011-12-10: 30 mL via ORAL
  Filled 2011-12-10 (×2): qty 30

## 2011-12-10 MED ORDER — ONDANSETRON HCL 4 MG/2ML IJ SOLN
4.0000 mg | Freq: Once | INTRAMUSCULAR | Status: AC
  Administered 2011-12-10: 4 mg via INTRAVENOUS
  Filled 2011-12-10: qty 2

## 2011-12-10 MED ORDER — SODIUM CHLORIDE 0.9 % IJ SOLN: 3.0000 mL | INTRAMUSCULAR | Status: DC | PRN

## 2011-12-10 MED ORDER — SODIUM CHLORIDE 0.9 % IJ SOLN
3.0000 mL | Freq: Two times a day (BID) | INTRAMUSCULAR | Status: DC
  Administered 2011-12-10: 3 mL via INTRAVENOUS

## 2011-12-10 MED ORDER — PANTOPRAZOLE SODIUM 40 MG IV SOLR
40.0000 mg | Freq: Once | INTRAVENOUS | Status: AC
  Administered 2011-12-10: 40 mg via INTRAVENOUS
  Filled 2011-12-10: qty 40

## 2011-12-10 MED ORDER — INSULIN ASPART 100 UNIT/ML ~~LOC~~ SOLN
3.0000 [IU] | Freq: Three times a day (TID) | SUBCUTANEOUS | Status: DC
  Administered 2011-12-11: 4 [IU] via SUBCUTANEOUS

## 2011-12-10 NOTE — H&P (Signed)
[facility] History and Physical  Michael Hutchinson NFA:[id number] DOB: [birth year] DOA: 12/10/2011  Referring physician: ED PCP: Pcp Not In System  Specialists: None  Chief Complaint: vomiting, epigastric pain  HPI: Michael Hutchinson is a 36 y.o. male who presents with c/o chest pain, multiple episodes of reflux and 1 episode of true billious emesis in the ED today.  Symptoms onset today, located in his epigastric area radiating up into his chest.  Quality of emesis was billious.  Nothing makes it better nor worse, no associated SOB.  No L sided chest pain, no arm pain, no jaw pain.  In the ED troponins were negative, labs were mostly unremarkable, hospitalist has been asked to admit for pain control of what is most likely esophagitis.  Review of Systems: 12 systems reviewed and otherwise negative.  Past Medical History  Diagnosis Date  . Diabetes mellitus   . Diabetic ketoacidosis   . GERD (gastroesophageal reflux disease)   . Meniscus tear    Past Surgical History  Procedure Date  . Knee arthroscopy    Social History:  reports that he has never smoked. He does not have any smokeless tobacco history on file. He reports that he does not drink alcohol or use illicit drugs.   No Known Allergies  No family history on file.   Prior to Admission medications   Medication Sig Start Date End Date Taking? Authorizing Provider  atorvastatin (LIPITOR) 40 MG tablet Take 40 mg by mouth daily.   Yes Historical Provider, MD  insulin glargine (LANTUS) 100 UNIT/ML injection Inject 45 Units into the skin at bedtime.    Yes Historical Provider, MD  insulin lispro (HUMALOG) 100 UNIT/ML injection Inject 3-20 Units into the skin 3 (three) times daily before meals. Sliding scale per carb count 4 units for every 30 carbs   Yes Historical Provider, MD   Physical Exam: Filed Vitals:   12/10/11 1530 12/10/11 2020  BP: 110/69 120/76  Pulse: 103   Temp: 97.7 F (36.5 C)   TempSrc: Oral   Resp: 20    Height: 6' (1.829 m)   Weight: 124.739 kg (275 lb)   SpO2: 100% 97%    General:  NAD, resting comfortably in bed Eyes: PEERLA EOMI ENT: mucous membranes moist Neck: supple w/o JVD Cardiovascular: RRR w/o MRG Respiratory: CTA B Abdomen: soft, nt, nd, bs+ Skin: no rash nor lesion Musculoskeletal: MAE, full ROM all 4 extremities Psychiatric: normal tone and affect Neurologic: AAOx3, grossly non-focal  Labs on Admission:  Basic Metabolic Panel:  Lab 12/10/11 4696  NA 136  K 3.1*  CL 98  CO2 24  GLUCOSE 124*  BUN 11  CREATININE 0.77  CALCIUM 10.0  MG --  PHOS --   Liver Function Tests:  Lab 12/10/11 1624  AST 14  ALT 13  ALKPHOS 93  BILITOT 0.4  PROT 7.7  ALBUMIN 4.2    Lab 12/10/11 1624  LIPASE 19  AMYLASE --   No results found for this basename: AMMONIA:5 in the last 168 hours CBC:  Lab 12/10/11 1540  WBC 9.1  NEUTROABS 6.6  HGB 15.0  HCT 42.4  MCV 86.7  PLT 237   Cardiac Enzymes: No results found for this basename: CKTOTAL:5,CKMB:5,CKMBINDEX:5,TROPONINI:5 in the last 168 hours  BNP (last 3 results) No results found for this basename: PROBNP:3 in the last 8760 hours CBG:  Lab 12/10/11 1727  GLUCAP 131*    Radiological Exams on Admission: Dg Chest 2 View  12/10/2011  *  RADIOLOGY REPORT*  Clinical Data: Chest pain.  Nausea and vomiting.  Hypoglycemia.  CHEST - 2 VIEW  Comparison: Two-view chest x-ray 12/20/2010, 05/23/2010.  Findings: Suboptimal inspiration due to body habitus which accounts for crowded bronchovascular markings at the bases and accentuates the cardiac silhouette.  Taking this into account, cardiomediastinal silhouette unremarkable and unchanged.  Lungs clear.  No pleural effusions.  Visualized bony thorax intact.  No significant interval change.  IMPRESSION: Suboptimal inspiration.  No acute cardiopulmonary disease.  Stable examination.   Original Report Authenticated By: Hulan Saas, M.D.     EKG: Independently  reviewed.  Assessment/Plan Principal Problem:  *Epigastric pain Active Problems:  Vomiting   1. Epigastric pain - probably some combination of GERD and his vomiting, doubt cardiac but will check troponins anyhow, treating with GI cocktail and PPI initially.  Narcotics if this fails.  Given longstanding uncontrolled GERD probably needs EGD in near future but this probably can be done as outpatient. 2. Vomiting - 1 episode, could be viral, doubt gastroparesis since only 1 episode, getting IV zofran in ED, will continue on floor. 3. DM1 - continue home insulin,   No consults called.  Code Status: Full Code (must indicate code status--if unknown or must be presumed, indicate so) Family Communication: Spoke with 2 family members at bedside (indicate person spoken with, if applicable, with phone number if by telephone) Disposition Plan: Admit to obs (indicate anticipated LOS)  Time spent: 50 min  GARDNER, JARED M. [facility] Pager [phone]  If 7PM-7AM, please contact night-coverage [email or web address] Password [email or web address] 12/10/2011, 8:21 PM

## 2011-12-10 NOTE — ED Notes (Signed)
Pt placed on cardiac monitor and continuous SPO2

## 2011-12-10 NOTE — ED Notes (Signed)
Patient reports he noted his blood sugar was elevated today, over 500,  He states they have made recent adjustment to insulin.   He is also had onset of chest pain at 1030.  Patient is pale in color and reports he is feeling nauseated.

## 2011-12-10 NOTE — ED Provider Notes (Signed)
History     CSN: [id number]  Arrival date & time 12/10/11  1525   First MD Initiated Contact with Patient 12/10/11 1540      Chief Complaint  Patient presents with  . Chest Pain  . Nausea    (Consider location/radiation/quality/duration/timing/severity/associated sxs/prior treatment) HPI Comments: Is a 36 year old diabetic man who had onset of vomiting and chest pain and goes into his abdomen around 9:30 or 10 this morning. He says his blood sugar has been quite high. He is on insulin for his diabetes. He has been previously hospitalized at [facility] in May of 2012 for diabetic ketoacidosis. He's also been diagnosed with gastroesophageal reflux disease on that admission. He says that he has been taking his insulin as prescribed. About a week ago his Lantus had been changed from a dose at night to a dose in the morning.  Patient is a 36 y.o. male presenting with chest pain. The history is provided by the patient. No language interpreter was used.  Chest Pain The chest pain began 3 - 5 hours ago. Chest pain occurs constantly. The chest pain is unchanged. Associated with: Vomiting. At its most intense, the pain is at 10/10. The pain is currently at 10/10. The severity of the pain is severe. The quality of the pain is described as burning. The pain radiates to the epigastrium. Exacerbated by: Vomiting. Primary symptoms include nausea and vomiting. Pertinent negatives for primary symptoms include no fever. Associated symptoms comments: None. Risk factors include male gender and obesity (Diabetes).  His past medical history is significant for diabetes. Past medical history comments: Esophageal reflux     Past Medical History  Diagnosis Date  . Diabetes mellitus   . Diabetic ketoacidosis   . GERD (gastroesophageal reflux disease)   . Meniscus tear     Past Surgical History  Procedure Date  . Knee arthroscopy     No family history on file.  History  Substance Use Topics    . Smoking status: Never Smoker   . Smokeless tobacco: Not on file  . Alcohol Use: No      Review of Systems  Constitutional: Negative for fever and chills.  HENT: Negative.   Eyes: Negative.   Respiratory: Negative.   Cardiovascular: Positive for chest pain.  Gastrointestinal: Positive for nausea and vomiting.  Genitourinary: Negative.   Skin: Negative.   Neurological: Negative.   Psychiatric/Behavioral: Negative.     Allergies  Review of patient's allergies indicates no known allergies.  Home Medications   Current Outpatient Rx  Name  Route  Sig  Dispense  Refill  . ATORVASTATIN CALCIUM 40 MG PO TABS   Oral   Take 40 mg by mouth daily.         . INSULIN GLARGINE 100 UNIT/ML [place] SOLN   Subcutaneous   Inject 45 Units into the skin at bedtime.          . INSULIN LISPRO (HUMAN) 100 UNIT/ML [place] SOLN   Subcutaneous   Inject 3-20 Units into the skin 3 (three) times daily before meals. Sliding scale per carb count 4 units for every 30 carbs           BP 110/69  Pulse 103  Temp 97.7 F (36.5 C) (Oral)  Resp 20  Ht 6' (1.829 m)  Wt 275 lb (124.739 kg)  BMI 37.30 kg/m2  SpO2 100%  Physical Exam  Vitals reviewed. Constitutional: He is oriented to person, place, and time.  Obese young man, retching up bilious fluid.  HENT:  Head: Normocephalic and atraumatic.  Right Ear: External ear normal.  Left Ear: External ear normal.  Mouth/Throat: Oropharynx is clear and moist.  Eyes: Conjunctivae normal and EOM are normal. Pupils are equal, round, and reactive to light. No scleral icterus.  Neck: Normal range of motion. Neck supple.  Cardiovascular: Normal rate, regular rhythm and normal heart sounds.   Pulmonary/Chest: Effort normal and breath sounds normal.  Abdominal: Soft. Bowel sounds are normal.       Has mild to moderate epigastric tenderness.  Musculoskeletal: Normal range of motion. He exhibits no edema and no tenderness.  Neurological: He is alert  and oriented to person, place, and time.       No sensory or motor deficit.  Skin: Skin is warm and dry.  Psychiatric: He has a normal mood and affect. His behavior is normal.    ED Course  Procedures (including critical care time)   Labs Reviewed  CBC  BASIC METABOLIC PANEL  CBC WITH DIFFERENTIAL  COMPREHENSIVE METABOLIC PANEL  URINALYSIS, ROUTINE W REFLEX MICROSCOPIC  LIPASE, BLOOD   4:31 PM Patient was seen and had physical examination. IV fluids and intravenous medication for pain and nausea were ordered. Laboratory workup was ordered. Old charts were reviewed.  5:33 PM  Date: 12/10/2011  Rate: 105  Rhythm: sinus tachycardia  QRS Axis: normal  Intervals: normal  ST/T Wave abnormalities: normal  Conduction Disutrbances:none  Narrative Interpretation: Normal EKG  Old EKG Reviewed: none available  7:06 PM Results for orders placed during the hospital encounter of 12/10/11  CBC      Component Value Range   WBC 9.1  4.0 - 10.5 K/uL   RBC 4.89  4.22 - 5.81 MIL/uL   Hemoglobin 15.0  13.0 - 17.0 g/dL   HCT 78.2  95.6 - 21.3 %   MCV 86.7  78.0 - 100.0 fL   MCH 30.7  26.0 - 34.0 pg   MCHC 35.4  30.0 - 36.0 g/dL   RDW 08.6  57.8 - 46.9 %   Platelets 237  150 - 400 K/uL  BASIC METABOLIC PANEL      Component Value Range   Sodium 136  135 - 145 mEq/L   Potassium 3.1 (*) 3.5 - 5.1 mEq/L   Chloride 98  96 - 112 mEq/L   CO2 24  19 - 32 mEq/L   Glucose, Bld 124 (*) 70 - 99 mg/dL   BUN 11  6 - 23 mg/dL   Creatinine, Ser 6.29  0.50 - 1.35 mg/dL   Calcium 52.8  8.4 - 41.3 mg/dL   GFR calc non Af Amer >90  >90 mL/min   GFR calc Af Amer >90  >90 mL/min  URINALYSIS, ROUTINE W REFLEX MICROSCOPIC      Component Value Range   Color, Urine AMBER (*) YELLOW   APPearance CLOUDY (*) CLEAR   Specific Gravity, Urine 1.034 (*) 1.005 - 1.030   pH 7.5  5.0 - 8.0   Glucose, UA 500 (*) NEGATIVE mg/dL   Hgb urine dipstick NEGATIVE  NEGATIVE   Bilirubin Urine SMALL (*) NEGATIVE    Ketones, ur 15 (*) NEGATIVE mg/dL   Protein, ur 244 (*) NEGATIVE mg/dL   Urobilinogen, UA 2.0 (*) 0.0 - 1.0 mg/dL   Nitrite NEGATIVE  NEGATIVE   Leukocytes, UA NEGATIVE  NEGATIVE  LIPASE, BLOOD      Component Value Range   Lipase 19  11 - 59  U/L  HEPATIC FUNCTION PANEL      Component Value Range   Total Protein 7.7  6.0 - 8.3 g/dL   Albumin 4.2  3.5 - 5.2 g/dL   AST 14  0 - 37 U/L   ALT 13  0 - 53 U/L   Alkaline Phosphatase 93  39 - 117 U/L   Total Bilirubin 0.4  0.3 - 1.2 mg/dL   Bilirubin, Direct <0.9  0.0 - 0.3 mg/dL   Indirect Bilirubin NOT CALCULATED  0.3 - 0.9 mg/dL  DIFFERENTIAL      Component Value Range   Neutrophils Relative 71  43 - 77 %   Neutro Abs 6.6  1.7 - 7.7 K/uL   Lymphocytes Relative 17  12 - 46 %   Lymphs Abs 1.6  0.7 - 4.0 K/uL   Monocytes Relative 10  3 - 12 %   Monocytes Absolute 1.0  0.1 - 1.0 K/uL   Eosinophils Relative 1  0 - 5 %   Eosinophils Absolute 0.1  0.0 - 0.7 K/uL   Basophils Relative 0  0 - 1 %   Basophils Absolute 0.0  0.0 - 0.1 K/uL  POCT I-STAT TROPONIN I      Component Value Range   Troponin i, poc 0.00  0.00 - 0.08 ng/mL   Comment 3           GLUCOSE, CAPILLARY      Component Value Range   Glucose-Capillary 131 (*) 70 - 99 mg/dL  URINE MICROSCOPIC-ADD ON      Component Value Range   Squamous Epithelial / LPF RARE  RARE   WBC, UA 3-6  <3 WBC/hpf   RBC / HPF 0-2  <3 RBC/hpf   Urine-Other MUCOUS PRESENT     Dg Chest 2 View  12/10/2011  *RADIOLOGY REPORT*  Clinical Data: Chest pain.  Nausea and vomiting.  Hypoglycemia.  CHEST - 2 VIEW  Comparison: Two-view chest x-ray 12/20/2010, 05/23/2010.  Findings: Suboptimal inspiration due to body habitus which accounts for crowded bronchovascular markings at the bases and accentuates the cardiac silhouette.  Taking this into account, cardiomediastinal silhouette unremarkable and unchanged.  Lungs clear.  No pleural effusions.  Visualized bony thorax intact.  No significant interval change.   IMPRESSION: Suboptimal inspiration.  No acute cardiopulmonary disease.  Stable examination.   Original Report Authenticated By: Hulan Saas, M.D.     Lab workup negative.  Pt still complaining of chest pain.  Will repeat IV Dilaudid and Zofran, add Protonix 40 mg IV, request admission for chest pain, diabetic gastroparesis.   1. Chest pain   2. Diabetic gastroparesis          Carleene Cooper III, MD 12/10/11 2038

## 2011-12-11 DIAGNOSIS — K3184 Gastroparesis: Secondary | ICD-10-CM

## 2011-12-11 DIAGNOSIS — E1149 Type 2 diabetes mellitus with other diabetic neurological complication: Secondary | ICD-10-CM

## 2011-12-11 LAB — TROPONIN I
Troponin I: <0.3 ng/mL (ref <0.30)
Troponin I: <0.3 ng/mL (ref <0.30)

## 2011-12-11 LAB — BASIC METABOLIC PANEL
Chloride: 100 mEq/L (ref 96–112)
Creatinine, Ser: 0.83 mg/dL (ref 0.50–1.35)
GFR calc Af Amer: >90 mL/min (ref >90)
Potassium: 4.8 mEq/L (ref 3.5–5.1)
Sodium: 138 mEq/L (ref 135–145)

## 2011-12-11 LAB — CBC
Platelets: 194 10*3/uL (ref 150–400)
RBC: 4.29 MIL/uL (ref 4.22–5.81)
RDW: 12.6 % (ref 11.5–15.5)
WBC: 9.1 10*3/uL (ref 4.0–10.5)

## 2011-12-11 MED ORDER — PANTOPRAZOLE SODIUM 40 MG PO TBEC: 80.0000 mg | DELAYED_RELEASE_TABLET | Freq: Every day | ORAL | Status: DC

## 2011-12-11 MED ORDER — INSULIN GLARGINE 100 UNIT/ML ~~LOC~~ SOLN
45.0000 [IU] | Freq: Every day | SUBCUTANEOUS | Status: DC
  Administered 2011-12-11: 45 [IU] via SUBCUTANEOUS

## 2011-12-11 MED ORDER — ONDANSETRON 4 MG PO TBDP
4.0000 mg | ORAL_TABLET | Freq: Three times a day (TID) | ORAL | Status: DC | PRN
Start: 2011-12-11 — End: 2012-06-30

## 2011-12-11 NOTE — Discharge Summary (Signed)
Physician Discharge Summary  Michael Hutchinson OZH:[id number] DOB: 1975-04-08 DOA: 12/10/2011  PCP: Pcp Not In System  Admit date: 12/10/2011 Discharge date: 12/11/2011  Time spent: 35 minutes  Recommendations for Outpatient Follow-up:  1. Please be sure to encourage lifestyle changes for reflux 2. Also f/u on potassium levels  Discharge Diagnoses:  Principal Problem:  *Epigastric pain Active Problems:  Vomiting   Discharge Condition: stable  Diet recommendation: low sodium heart healthy  Filed Weights   12/10/11 1530  Weight: 124.739 kg (275 lb)    History of present illness:  From original HPI: Michael Hutchinson is a 36 y.o. male who presents with c/o chest pain, multiple episodes of reflux and 1 episode of true billious emesis in the ED today. Symptoms onset today, located in his epigastric area radiating up into his chest. Quality of emesis was billious. Nothing makes it better nor worse, no associated SOB. No L sided chest pain, no arm pain, no jaw pain.  In the ED troponins were negative, labs were mostly unremarkable, hospitalist has been asked to admit for pain control of what is most likely esophagitis.  Hospital Course:  Chest discomfort - Most likely due to reflux as patient had improvement on protonix.  Will provide script upon discharge - Also recommended lifestyle changes and counseled patient on what makes GERD worse - Cardiac enzymes negative. - EKG showed no consistent ST elevation or depressions  Emesis - ceased on day of discharge - supportive therapy - patient was able to tolerate po intake this morning - provide script for zofran prn nausea  DM - Stable will have patient continue home regimen.  Procedures:  none  Consultations:  None  Discharge Exam: Filed Vitals:   12/10/11 1530 12/10/11 2020 12/10/11 2216 12/11/11 0431  BP: 110/69 120/76 134/87 116/71  Pulse: 103  127 110  Temp: 97.7 F (36.5 C)  99.5 F (37.5 C) 99.4 F (37.4 C)    TempSrc: Oral  Oral Oral  Resp: 20 14 17 18   Height: 6' (1.829 m)     Weight: 124.739 kg (275 lb)     SpO2: 100% 97% 96% 98%    General: Pt in NAD , Alert and Awake Cardiovascular: RRR, no MRG, no chest pain on palpation. Respiratory: CTA BL, no wheezes Abdomen: Soft, no rebound tenderness, no guarding  Discharge Instructions  Discharge Orders    Future Orders Please Complete By Expires   Diet - low sodium heart healthy      Increase activity slowly      Discharge instructions      Comments:   Please be sure to follow up with your PCP in 1-2 days or sooner should you have any new concerns.   Call MD for:  temperature >100.4      Call MD for:  persistant nausea and vomiting      Call MD for:  severe uncontrolled pain      Call MD for:  extreme fatigue          Medication List     As of 12/11/2011 10:27 AM    TAKE these medications         atorvastatin 40 MG tablet   Commonly known as: LIPITOR   Take 40 mg by mouth daily.      insulin glargine 100 UNIT/ML injection   Commonly known as: LANTUS   Inject 45 Units into the skin at bedtime.      insulin lispro 100 UNIT/ML injection  Commonly known as: HUMALOG   Inject 3-20 Units into the skin 3 (three) times daily before meals. Sliding scale per carb count  4 units for every 30 carbs      ondansetron 4 MG disintegrating tablet   Commonly known as: ZOFRAN-ODT   Take 1 tablet (4 mg total) by mouth every 8 (eight) hours as needed for nausea.      pantoprazole 40 MG tablet   Commonly known as: PROTONIX   Take 2 tablets (80 mg total) by mouth daily.          The results of significant diagnostics from this hospitalization (including imaging, microbiology, ancillary and laboratory) are listed below for reference.    Significant Diagnostic Studies: Dg Chest 2 View  12/10/2011  *RADIOLOGY REPORT*  Clinical Data: Chest pain.  Nausea and vomiting.  Hypoglycemia.  CHEST - 2 VIEW  Comparison: Two-view chest x-ray  12/20/2010, 05/23/2010.  Findings: Suboptimal inspiration due to body habitus which accounts for crowded bronchovascular markings at the bases and accentuates the cardiac silhouette.  Taking this into account, cardiomediastinal silhouette unremarkable and unchanged.  Lungs clear.  No pleural effusions.  Visualized bony thorax intact.  No significant interval change.  IMPRESSION: Suboptimal inspiration.  No acute cardiopulmonary disease.  Stable examination.   Original Report Authenticated By: Hulan Saas, M.D.     Microbiology: No results found for this or any previous visit (from the past 240 hour(s)).   Labs: Basic Metabolic Panel:  Lab 12/11/11 1610 12/10/11 1540  NA 138 136  K 4.8 3.1*  CL 100 98  CO2 28 24  GLUCOSE 159* 124*  BUN 12 11  CREATININE 0.83 0.77  CALCIUM 9.1 10.0  MG -- --  PHOS -- --   Liver Function Tests:  Lab 12/10/11 1624  AST 14  ALT 13  ALKPHOS 93  BILITOT 0.4  PROT 7.7  ALBUMIN 4.2    Lab 12/10/11 1624  LIPASE 19  AMYLASE --   No results found for this basename: AMMONIA:5 in the last 168 hours CBC:  Lab 12/11/11 0221 12/10/11 1540  WBC 9.1 9.1  NEUTROABS -- 6.6  HGB 13.4 15.0  HCT 38.6* 42.4  MCV 90.0 86.7  PLT 194 237   Cardiac Enzymes:  Lab 12/11/11 0830 12/11/11 0221 12/10/11 2027  CKTOTAL -- -- --  CKMB -- -- --  CKMBINDEX -- -- --  TROPONINI <0.30 <0.30 <0.30   BNP: BNP (last 3 results) No results found for this basename: PROBNP:3 in the last 8760 hours CBG:  Lab 12/10/11 1727  GLUCAP 131*       Signed:  Penny Pia  [facility] 12/11/2011, 10:27 AM

## 2012-02-15 ENCOUNTER — Encounter: Payer: Self-pay | Admitting: *Deleted

## 2012-02-15 ENCOUNTER — Encounter: Payer: Self-pay | Admitting: Cardiovascular Disease

## 2012-02-15 DIAGNOSIS — K219 Gastro-esophageal reflux disease without esophagitis: Secondary | ICD-10-CM | POA: Insufficient documentation

## 2012-02-15 DIAGNOSIS — S83209A Unspecified tear of unspecified meniscus, current injury, unspecified knee, initial encounter: Secondary | ICD-10-CM | POA: Insufficient documentation

## 2012-02-15 DIAGNOSIS — E1110 Type 2 diabetes mellitus with ketoacidosis without coma: Secondary | ICD-10-CM | POA: Insufficient documentation

## 2012-02-16 ENCOUNTER — Encounter: Payer: Self-pay | Admitting: *Deleted

## 2012-02-17 ENCOUNTER — Institutional Professional Consult (permissible substitution): Payer: BC Managed Care – PPO | Admitting: Cardiovascular Disease

## 2012-04-27 ENCOUNTER — Encounter: Payer: Self-pay | Admitting: Cardiovascular Disease

## 2012-06-10 HISTORY — PX: METACARPOPHALANGEAL JOINT ARTHRODESIS: SUR59

## 2012-06-28 ENCOUNTER — Other Ambulatory Visit: Payer: Self-pay | Admitting: Orthopedic Surgery

## 2012-06-28 ENCOUNTER — Encounter (HOSPITAL_COMMUNITY): Payer: Self-pay | Admitting: Pharmacy Technician

## 2012-06-28 ENCOUNTER — Encounter (HOSPITAL_COMMUNITY): Payer: Self-pay | Admitting: *Deleted

## 2012-06-29 MED ORDER — DEXTROSE 5 % IV SOLN
3.0000 g | INTRAVENOUS | Status: AC
  Administered 2012-06-30: 3 g via INTRAVENOUS
  Filled 2012-06-29: qty 3000

## 2012-06-30 ENCOUNTER — Encounter (HOSPITAL_COMMUNITY): Payer: Self-pay | Admitting: Certified Registered"

## 2012-06-30 ENCOUNTER — Encounter (HOSPITAL_COMMUNITY): Payer: Self-pay | Admitting: Anesthesiology

## 2012-06-30 ENCOUNTER — Ambulatory Visit (HOSPITAL_COMMUNITY): Payer: BC Managed Care – PPO | Admitting: Anesthesiology

## 2012-06-30 ENCOUNTER — Encounter (HOSPITAL_COMMUNITY)
Admission: RE | Disposition: A | Discharge status: Home / Self Care | Payer: Self-pay | Source: Ambulatory Visit | Attending: Orthopedic Surgery

## 2012-06-30 ENCOUNTER — Ambulatory Visit (HOSPITAL_COMMUNITY)
Admission: RE | Admit: 2012-06-30 | Discharge: 2012-06-30 | Disposition: A | Discharge status: Home / Self Care | Payer: BC Managed Care – PPO | Source: Ambulatory Visit | Attending: Orthopedic Surgery | Admitting: Orthopedic Surgery

## 2012-06-30 DIAGNOSIS — Z79899 Other long term (current) drug therapy: Secondary | ICD-10-CM | POA: Insufficient documentation

## 2012-06-30 DIAGNOSIS — S6292XA Unspecified fracture of left wrist and hand, initial encounter for closed fracture: Secondary | ICD-10-CM

## 2012-06-30 DIAGNOSIS — IMO0002 Reserved for concepts with insufficient information to code with codable children: Secondary | ICD-10-CM | POA: Insufficient documentation

## 2012-06-30 DIAGNOSIS — K219 Gastro-esophageal reflux disease without esophagitis: Secondary | ICD-10-CM | POA: Insufficient documentation

## 2012-06-30 DIAGNOSIS — Z8614 Personal history of Methicillin resistant Staphylococcus aureus infection: Secondary | ICD-10-CM | POA: Insufficient documentation

## 2012-06-30 DIAGNOSIS — Z794 Long term (current) use of insulin: Secondary | ICD-10-CM | POA: Insufficient documentation

## 2012-06-30 DIAGNOSIS — E109 Type 1 diabetes mellitus without complications: Secondary | ICD-10-CM | POA: Insufficient documentation

## 2012-06-30 DIAGNOSIS — X58XXXA Exposure to other specified factors, initial encounter: Secondary | ICD-10-CM | POA: Insufficient documentation

## 2012-06-30 DIAGNOSIS — Z6841 Body Mass Index (BMI) 40.0 and over, adult: Secondary | ICD-10-CM | POA: Insufficient documentation

## 2012-06-30 HISTORY — DX: Shortness of breath: R06.02

## 2012-06-30 HISTORY — PX: OPEN REDUCTION INTERNAL FIXATION (ORIF) PROXIMAL PHALANX: SHX6235

## 2012-06-30 HISTORY — DX: Personal history of Methicillin resistant Staphylococcus aureus infection: Z86.14

## 2012-06-30 LAB — BASIC METABOLIC PANEL
Chloride: 102 mEq/L (ref 96–112)
Creatinine, Ser: 0.9 mg/dL (ref 0.50–1.35)
GFR calc Af Amer: >90 mL/min (ref >90)

## 2012-06-30 LAB — CBC
HCT: 41.4 % (ref 39.0–52.0)
Platelets: 217 10*3/uL (ref 150–400)
RDW: 12.8 % (ref 11.5–15.5)
WBC: 5.3 10*3/uL (ref 4.0–10.5)

## 2012-06-30 LAB — SURGICAL PCR SCREEN
MRSA, PCR: NEGATIVE
Staphylococcus aureus: NEGATIVE

## 2012-06-30 LAB — GLUCOSE, CAPILLARY: Glucose-Capillary: 106 mg/dL — ABNORMAL HIGH (ref 70–99)

## 2012-06-30 SURGERY — OPEN REDUCTION INTERNAL FIXATION (ORIF) PROXIMAL PHALANX
Anesthesia: General | Site: Finger | Laterality: Left | Wound class: Clean

## 2012-06-30 MED ORDER — LACTATED RINGERS IV SOLN
INTRAVENOUS | Status: DC
  Administered 2012-06-30 (×2): via INTRAVENOUS

## 2012-06-30 MED ORDER — ONDANSETRON HCL 4 MG/2ML IJ SOLN
INTRAMUSCULAR | Status: DC | PRN
  Administered 2012-06-30: 4 mg via INTRAVENOUS

## 2012-06-30 MED ORDER — ONDANSETRON HCL 4 MG/2ML IJ SOLN: 4.0000 mg | Freq: Once | INTRAMUSCULAR | Status: DC | PRN

## 2012-06-30 MED ORDER — BUPIVACAINE-EPINEPHRINE PF 0.5-1:200000 % IJ SOLN
INTRAMUSCULAR | Status: DC | PRN
  Administered 2012-06-30: 25 mL

## 2012-06-30 MED ORDER — HYDROMORPHONE HCL PF 1 MG/ML IJ SOLN
0.2500 mg | INTRAMUSCULAR | Status: DC | PRN
  Administered 2012-06-30 (×2): 0.5 mg via INTRAVENOUS

## 2012-06-30 MED ORDER — PROPOFOL 10 MG/ML IV BOLUS
INTRAVENOUS | Status: DC | PRN
  Administered 2012-06-30: 200 mg via INTRAVENOUS

## 2012-06-30 MED ORDER — OXYCODONE HCL 5 MG PO TABS: 5.0000 mg | ORAL_TABLET | Freq: Once | ORAL | Status: DC | PRN

## 2012-06-30 MED ORDER — MUPIROCIN 2 % EX OINT
TOPICAL_OINTMENT | Freq: Two times a day (BID) | CUTANEOUS | Status: DC
  Administered 2012-06-30: 1 via NASAL
  Filled 2012-06-30: qty 22

## 2012-06-30 MED ORDER — MUPIROCIN 2 % EX OINT
TOPICAL_OINTMENT | CUTANEOUS | Status: AC
  Administered 2012-06-30: 1 via NASAL
  Filled 2012-06-30: qty 22

## 2012-06-30 MED ORDER — LIDOCAINE HCL (CARDIAC) 20 MG/ML IV SOLN
INTRAVENOUS | Status: DC | PRN
  Administered 2012-06-30: 100 mg via INTRAVENOUS

## 2012-06-30 MED ORDER — DOXYCYCLINE HYCLATE 50 MG PO CAPS: 100.0000 mg | ORAL_CAPSULE | Freq: Two times a day (BID) | ORAL | Status: DC

## 2012-06-30 MED ORDER — OXYCODONE HCL 5 MG/5ML PO SOLN: 5.0000 mg | Freq: Once | ORAL | Status: DC | PRN

## 2012-06-30 MED ORDER — MUPIROCIN 2 % EX OINT: TOPICAL_OINTMENT | Freq: Two times a day (BID) | CUTANEOUS | Status: AC

## 2012-06-30 MED ORDER — CEPHALEXIN 500 MG PO CAPS: 500.0000 mg | ORAL_CAPSULE | Freq: Four times a day (QID) | ORAL | Status: DC

## 2012-06-30 MED ORDER — HYDROMORPHONE HCL PF 1 MG/ML IJ SOLN
INTRAMUSCULAR | Status: AC
  Filled 2012-06-30: qty 1

## 2012-06-30 MED ORDER — 0.9 % SODIUM CHLORIDE (POUR BTL) OPTIME
TOPICAL | Status: DC | PRN
  Administered 2012-06-30: 1000 mL

## 2012-06-30 MED ORDER — FENTANYL CITRATE 0.05 MG/ML IJ SOLN
INTRAMUSCULAR | Status: DC | PRN
  Administered 2012-06-30: 50 ug via INTRAVENOUS
  Administered 2012-06-30: 100 ug via INTRAVENOUS
  Administered 2012-06-30: 50 ug via INTRAVENOUS

## 2012-06-30 MED ORDER — CHLORHEXIDINE GLUCONATE 4 % EX LIQD: 60.0000 mL | Freq: Once | CUTANEOUS | Status: DC

## 2012-06-30 MED ORDER — MIDAZOLAM HCL 5 MG/5ML IJ SOLN
INTRAMUSCULAR | Status: DC | PRN
  Administered 2012-06-30: 2 mg via INTRAVENOUS

## 2012-06-30 SURGICAL SUPPLY — 58 items
BANDAGE CONFORM 2  STR LF (GAUZE/BANDAGES/DRESSINGS) ×2 IMPLANT
BANDAGE ELASTIC 3 VELCRO ST LF (GAUZE/BANDAGES/DRESSINGS) ×1 IMPLANT
BANDAGE ELASTIC 4 VELCRO ST LF (GAUZE/BANDAGES/DRESSINGS) ×2 IMPLANT
BANDAGE GAUZE ELAST BULKY 4 IN (GAUZE/BANDAGES/DRESSINGS) ×1 IMPLANT
BNDG COHESIVE 1X5 TAN STRL LF (GAUZE/BANDAGES/DRESSINGS) IMPLANT
CAP PIN ORTHO PINK (CAP) IMPLANT
CAP PIN PROTECTOR ORTHO WHT (CAP) IMPLANT
CLOTH BEACON ORANGE TIMEOUT ST (SAFETY) ×2 IMPLANT
CORDS BIPOLAR (ELECTRODE) ×2 IMPLANT
COVER SURGICAL LIGHT HANDLE (MISCELLANEOUS) ×2 IMPLANT
CUFF TOURNIQUET SINGLE 18IN (TOURNIQUET CUFF) ×1 IMPLANT
CUFF TOURNIQUET SINGLE 24IN (TOURNIQUET CUFF) ×1 IMPLANT
DRAPE OEC MINIVIEW 54X84 (DRAPES) ×1 IMPLANT
DRAPE SURG 17X23 STRL (DRAPES) ×2 IMPLANT
DRSG EMULSION OIL 3X3 NADH (GAUZE/BANDAGES/DRESSINGS) ×1 IMPLANT
GAUZE SPONGE 2X2 8PLY STRL LF (GAUZE/BANDAGES/DRESSINGS) IMPLANT
GAUZE XEROFORM 1X8 LF (GAUZE/BANDAGES/DRESSINGS) ×1 IMPLANT
GLOVE BIOGEL M STRL SZ7.5 (GLOVE) ×2 IMPLANT
GLOVE SS BIOGEL STRL SZ 8 (GLOVE) ×1 IMPLANT
GLOVE SUPERSENSE BIOGEL SZ 8 (GLOVE) ×2
GOWN PREVENTION PLUS XLARGE (GOWN DISPOSABLE) ×3 IMPLANT
GOWN STRL NON-REIN LRG LVL3 (GOWN DISPOSABLE) ×2 IMPLANT
GOWN STRL REIN XL XLG (GOWN DISPOSABLE) ×2 IMPLANT
K-WIRE .035X5.75  STT K (WIRE) ×3 IMPLANT
K-WIRE SMTH SNGL TROCAR .028X4 (WIRE) ×4
KIT BASIN OR (CUSTOM PROCEDURE TRAY) ×2 IMPLANT
KIT ROOM TURNOVER OR (KITS) ×2 IMPLANT
KWIRE SMTH SNGL TROCAR .028X4 (WIRE) ×2 IMPLANT
MANIFOLD NEPTUNE II (INSTRUMENTS) ×2 IMPLANT
NDL HYPO 25GX1X1/2 BEV (NEEDLE) IMPLANT
NEEDLE HYPO 25GX1X1/2 BEV (NEEDLE) ×2 IMPLANT
NS IRRIG 1000ML POUR BTL (IV SOLUTION) ×2 IMPLANT
PACK ORTHO EXTREMITY (CUSTOM PROCEDURE TRAY) ×2 IMPLANT
PAD ARMBOARD 7.5X6 YLW CONV (MISCELLANEOUS) ×4 IMPLANT
PAD CAST 4YDX4 CTTN HI CHSV (CAST SUPPLIES) IMPLANT
PADDING CAST ABS 3INX4YD NS (CAST SUPPLIES) ×1
PADDING CAST ABS COTTON 3X4 (CAST SUPPLIES) ×1 IMPLANT
PADDING CAST COTTON 4X4 STRL (CAST SUPPLIES) ×2
SOLUTION BETADINE 4OZ (MISCELLANEOUS) ×2 IMPLANT
SPLINT PLASTER CAST XFAST 3X15 (CAST SUPPLIES) ×1 IMPLANT
SPLINT PLASTER EXTRA FAST 3X15 (CAST SUPPLIES) ×1
SPLINT PLASTER GYPS XFAST 3X15 (CAST SUPPLIES) IMPLANT
SPLINT PLASTER XTRA FASTSET 3X (CAST SUPPLIES) ×1
SPONGE GAUZE 2X2 STER 10/PKG (GAUZE/BANDAGES/DRESSINGS)
SPONGE GAUZE 4X4 12PLY (GAUZE/BANDAGES/DRESSINGS) ×1 IMPLANT
SPONGE SCRUB IODOPHOR (GAUZE/BANDAGES/DRESSINGS) ×2 IMPLANT
SUCTION FRAZIER TIP 10 FR DISP (SUCTIONS) ×1 IMPLANT
SUT MERSILENE 4 0 P 3 (SUTURE) IMPLANT
SUT PROLENE 4 0 PS 2 18 (SUTURE) ×2 IMPLANT
SUT VIC AB 2-0 CT1 27 (SUTURE)
SUT VIC AB 2-0 CT1 TAPERPNT 27 (SUTURE) IMPLANT
SUT VICRYL 4-0 PS2 18IN ABS (SUTURE) ×1 IMPLANT
SYR CONTROL 10ML LL (SYRINGE) ×1 IMPLANT
TOWEL OR 17X24 6PK STRL BLUE (TOWEL DISPOSABLE) ×2 IMPLANT
TOWEL OR 17X26 10 PK STRL BLUE (TOWEL DISPOSABLE) ×3 IMPLANT
TUBE CONNECTING 12X1/4 (SUCTIONS) ×1 IMPLANT
UNDERPAD 30X30 INCONTINENT (UNDERPADS AND DIAPERS) ×2 IMPLANT
WATER STERILE IRR 1000ML POUR (IV SOLUTION) ×2 IMPLANT

## 2012-06-30 NOTE — Anesthesia Preprocedure Evaluation (Addendum)
Anesthesia Evaluation  Patient identified by MRN, date of birth, ID band Patient awake    Reviewed: Allergy & Precautions, H&P , NPO status , Patient's Chart, lab work & pertinent test results  Airway Mallampati: I TM Distance: >3 FB Neck ROM: Full    Dental  (+) Teeth Intact and Dental Advisory Given   Pulmonary  breath sounds clear to auscultation        Cardiovascular Rhythm:Regular Rate:Normal     Neuro/Psych    GI/Hepatic   Endo/Other  diabetes, Well Controlled, Type 1, Insulin DependentMorbid obesity  Renal/GU      Musculoskeletal   Abdominal   Peds  Hematology   Anesthesia Other Findings   Reproductive/Obstetrics                          Anesthesia Physical Anesthesia Plan  ASA: II  Anesthesia Plan: General   Post-op Pain Management:    Induction: Intravenous  Airway Management Planned: LMA  Additional Equipment:   Intra-op Plan:   Post-operative Plan: Extubation in OR  Informed Consent: I have reviewed the patients History and Physical, chart, labs and discussed the procedure including the risks, benefits and alternatives for the proposed anesthesia with the patient or authorized representative who has indicated his/her understanding and acceptance.   Dental advisory given  Plan Discussed with: CRNA, Anesthesiologist and Surgeon  Anesthesia Plan Comments:         Anesthesia Quick Evaluation

## 2012-06-30 NOTE — Anesthesia Postprocedure Evaluation (Signed)
  Anesthesia Post-op Note  Patient: Michael Hutchinson  Procedure(s) Performed: Procedure(s): OPEN REDUCTION INTERNAL FIXATION (ORIF) LEFT INDEX FINGER PROXIMAL PHALANX FRACTURE WITH LIGAMENT REPAIR AS NECESSARY (Left)  Patient Location: PACU  Anesthesia Type:GA combined with regional for post-op pain  Level of Consciousness: awake, alert  and oriented  Airway and Oxygen Therapy: Patient Spontanous Breathing  Post-op Pain: none  Post-op Assessment: Post-op Vital signs reviewed  Post-op Vital Signs: Reviewed  Complications: No apparent anesthesia complications

## 2012-06-30 NOTE — Transfer of Care (Signed)
Immediate Anesthesia Transfer of Care Note  Patient: Michael Hutchinson  Procedure(s) Performed: Procedure(s): OPEN REDUCTION INTERNAL FIXATION (ORIF) LEFT INDEX FINGER PROXIMAL PHALANX FRACTURE WITH LIGAMENT REPAIR AS NECESSARY (Left)  Patient Location: PACU  Anesthesia Type:General and Regional  Level of Consciousness: awake, alert  and oriented  Airway & Oxygen Therapy: Patient Spontanous Breathing  Post-op Assessment: Report given to PACU RN and Post -op Vital signs reviewed and stable  Post vital signs: Reviewed and stable  Complications: No apparent anesthesia complications

## 2012-06-30 NOTE — Anesthesia Procedure Notes (Addendum)
Anesthesia Regional Block:  Supraclavicular block  Pre-Anesthetic Checklist: ,, timeout performed, Correct Patient, Correct Site, Correct Laterality, Correct Procedure, Correct Position, site marked, Risks and benefits discussed,  Surgical consent,  Pre-op evaluation,  At surgeon's request and post-op pain management   Prep: chloraprep       Needles:  Injection technique: Single-shot  Needle Type: Echogenic Needle     Needle Length: 5cm 5 cm Needle Gauge: 21    Additional Needles:  Procedures: ultrasound guided (picture in chart) Supraclavicular block Narrative:  Start time: 06/30/2012 9:01 AM End time: 06/30/2012 9:08 AM Injection made incrementally with aspirations every 5 mL.  Performed by: Personally  Anesthesiologist: Sheldon Silvan  Supraclavicular block Procedure Name: LMA Insertion Date/Time: 06/30/2012 9:36 AM Performed by: Sheppard Evens Pre-anesthesia Checklist: Patient identified, Emergency Drugs available, Suction available, Patient being monitored and Timeout performed Patient Re-evaluated:Patient Re-evaluated prior to inductionOxygen Delivery Method: Circle system utilized Preoxygenation: Pre-oxygenation with 100% oxygen Intubation Type: IV induction LMA: LMA inserted LMA Size: 5.0 Number of attempts: 1 Placement Confirmation: positive ETCO2 and breath sounds checked- equal and bilateral Tube secured with: Tape Dental Injury: Teeth and Oropharynx as per pre-operative assessment

## 2012-06-30 NOTE — H&P (Signed)
Michael Hutchinson is an 37 y.o. male.   Chief Complaint: Fracture left index finger intra-articular MCP joint HPI: Patient presents for open reduction internal fixation left proximal phalanx/MCP joint intra-articular fracture. Marland Kitchen.Patient presents for evaluation and treatment of the of their upper extremity predicament. The patient denies neck back chest or of abdominal pain. The patient notes that they have no lower extremity problems. The patient from primarily complains of the upper extremity pain noted.  Past Medical History  Diagnosis Date  . Diabetes mellitus type 1     since age 89  . Diabetic ketoacidosis   . GERD (gastroesophageal reflux disease)   . Meniscus tear   . Shortness of breath     with exertion only  . Hx MRSA infection     Inner thigh and under arm- healed areas    Past Surgical History  Procedure Laterality Date  . Knee arthroscopy      left    Family History  Problem Relation Age of Onset  . Other Mother   . Diabetes     Social History:  reports that he has never smoked. He has quit using smokeless tobacco. He reports that he drinks about 8.4 ounces of alcohol per week. He reports that he does not use illicit drugs.  Allergies: No Known Allergies  Medications Prior to Admission  Medication Sig Dispense Refill  . atorvastatin (LIPITOR) 40 MG tablet Take 40 mg by mouth daily.      . insulin glargine (LANTUS) 100 UNIT/ML injection Inject 52 Units into the skin at bedtime.       . insulin lispro (HUMALOG) 100 UNIT/ML injection Inject 3-20 Units into the skin 3 (three) times daily before meals. Sliding scale per carb count 4 units for every 30 carbs      . ondansetron (ZOFRAN ODT) 4 MG disintegrating tablet Take 1 tablet (4 mg total) by mouth every 8 (eight) hours as needed for nausea.  20 tablet  0  . pantoprazole (PROTONIX) 40 MG tablet Take 2 tablets (80 mg total) by mouth daily.  60 tablet  0    Results for orders placed during the hospital encounter of  06/30/12 (from the past 48 hour(s))  BASIC METABOLIC PANEL     Status: Abnormal   Collection Time    06/30/12  7:23 AM      Result Value Range   Sodium 139  135 - 145 mEq/L   Potassium 3.9  3.5 - 5.1 mEq/L   Chloride 102  96 - 112 mEq/L   CO2 29  19 - 32 mEq/L   Glucose, Bld 125 (*) 70 - 99 mg/dL   BUN 12  6 - 23 mg/dL   Creatinine, Ser 1.61  0.50 - 1.35 mg/dL   Calcium 8.9  8.4 - 09.6 mg/dL   GFR calc non Af Amer >90  >90 mL/min   GFR calc Af Amer >90  >90 mL/min   Comment:            The eGFR has been calculated     using the CKD EPI equation.     This calculation has not been     validated in all clinical     situations.     eGFR's persistently     <90 mL/min signify     possible Chronic Kidney Disease.  CBC     Status: None   Collection Time    06/30/12  7:23 AM      Result Value  Range   WBC 5.3  4.0 - 10.5 K/uL   RBC 4.69  4.22 - 5.81 MIL/uL   Hemoglobin 14.4  13.0 - 17.0 g/dL   HCT 16.1  09.6 - 04.5 %   MCV 88.3  78.0 - 100.0 fL   MCH 30.7  26.0 - 34.0 pg   MCHC 34.8  30.0 - 36.0 g/dL   RDW 40.9  81.1 - 91.4 %   Platelets 217  150 - 400 K/uL   No results found.  Review of Systems  Constitutional: Negative.   HENT: Negative.   Eyes: Negative.   Cardiovascular: Negative.   Gastrointestinal: Negative.   Genitourinary: Negative.   Skin: Negative.   Neurological: Negative.   Endo/Heme/Allergies: Negative.   Psychiatric/Behavioral: Negative.     Blood pressure 144/94, pulse 102, temperature 98.7 F (37.1 C), resp. rate 20, height 6' (1.829 m), weight 135.716 kg (299 lb 3.2 oz), SpO2 95.00%. Physical Exam Fracture at the left index finger -closed with severe intra-articular displacement .Marland KitchenThe patient is alert and oriented in no acute distress the patient complains of pain in the affected upper extremity.  The patient is noted to have a normal HEENT exam.  Lung fields show equal chest expansion and no shortness of breath  abdomen exam is nontender without  distention.  Lower extremity examination does not show any fracture dislocation or blood clot symptoms.  Pelvis is stable neck and back are stable and nontender  Assessment/Plan Plan for open reduction internal fixation left index finger proximal phalanx fracture intra-articular at the MCP joint  .Marland KitchenWe are planning surgery for your upper extremity. The risk and benefits of surgery include risk of bleeding infection anesthesia damage to normal structures and failure of the surgery to accomplish its intended goals of relieving symptoms and restoring function with this in mind we'll going to proceed. I have specifically discussed with the patient the pre-and postoperative regime and the does and don'ts and risk and benefits in great detail. Risk and benefits of surgery also include risk of dystrophy chronic nerve pain failure of the healing process to go onto completion and other inherent risks of surgery The relavent the pathophysiology of the disease/injury process, as well as the alternatives for treatment and postoperative course of action has been discussed in great detail with the patient who desires to proceed.  We will do everything in our power to help you (the patient) restore function to the upper extremity. Is a pleasure to see this patient today.   ALFREDO, SPONG 06/30/2012, 9:29 AM

## 2012-06-30 NOTE — Progress Notes (Signed)
Orthopedic Tech Progress Note Patient Details:  Michael Hutchinson August 13, 1975 [id number] Arm sling requested for patient by PACU nurse. Delivered to short stay for application by nurse once patient is dressed. Ortho Devices Type of Ortho Device: Arm sling Ortho Device/Splint Interventions: Ordered   [place] R Thompson 06/30/2012, 12:57 PM

## 2012-06-30 NOTE — Op Note (Signed)
NAME:  Michael Hutchinson, GNAU NO.:  [id number]  MEDICAL RECORD NO.:  [id number]  LOCATION:  [facility]                         FACILITY:  [facility]  PHYSICIAN:  Thaison Kolodziejski. [place] Stell, M.D.DATE OF BIRTH:  1975/07/04  DATE OF PROCEDURE: DATE OF DISCHARGE:                              OPERATIVE REPORT   PREOPERATIVE DIAGNOSIS:  Intra-articular metacarpophalangeal joint fracture about the proximal phalanx with gross displacement.  POSTOPERATIVE DIAGNOSIS:  Intra-articular metacarpophalangeal joint fracture about the proximal phalanx with gross displacement.  PROCEDURES: 1. Left index finger intra-articular metacarpophalangeal joint     fracture ORIF with K-wire fixation x5 K-wires. 2. Stress radiography. 3. Neurolysis, dorsal sensory branch, left index finger.  SURGEON:  Dionne Ano. Amanda Pea, M.D.  ASSISTANT:  Karie Chimera, P.A.-C.  COMPLICATIONS:  None.  ANESTHESIA:  General with preoperative block.  TOURNIQUET TIME:  Less than an hour.  INDICATIONS:  Patient is a pleasant male who presents with intra- articular MCP joint fracture.  He understands risks and benefits of surgery and desires to proceed the above-mentioned operative intervention.  All questions have been encouraged and answered preoperatively.  He has significant displacement, malalignment of the joint and unfortunately a block to motion.  OPERATION IN DETAIL:  The patient was seen by myself and anesthesia, taken to the operative suite, underwent smooth induction of general anesthesia, laid in supine position, prepped and draped in usual sterile fashion about the left upper extremity.  Once this done, the patient had time-out called.  Pre and postop check list completed.  Operation commenced with curvilinear modified Brunner incision dorsoulnarly. Dissection was carried down and extensive dorsal sensory ulnar nerve branch neurolysis of the ulnar aspect of the MCP joint was accomplished. The patient tolerated  this well, but this was a cutaneous sensory nerve. It was imbedded in the fracture and tissue.  Once this done, very carefully mobilized the extensor apparatus.  I did not take down the sagittal band completely, but at the level of the MCP joint midline, opened this area, I then identified the fracture site and very carefully performed placement of 5 Kirschner wires from proximal distal exiting dorsoradially about the index finger.  Once this done, I then once again prepared the fracture site.  This was a very comminuted fracture, but I was able to interdigitate the articular fragments nicely and then hold pressure while we engaged the K-wires across the fracture site x5.  I used two 0.028 K-wires and three 0.035 K-wires.  This actually achieved excellent purchase and I was pleased with this.  The patient was not a candidate for screw or plate fixation given the small intra-articular in nature.  The pins were bent at 90 degrees and placed outside the skin, needs to be removed in 4 weeks postop.  I have reviewed these issues at length and findings.  I am going to plan for copious irrigation, which we performed followed by closure of the capsule and closure of the interval about the extensor apparatus.  I did not take down the EDC and the sagittal band was competent.  A 4-0 Vicryl was used for the closure about this area.  I should note that patient had excellent full range  of motion.  Stress radiography revealed excellent position in the AP lateral oblique planes and under live fluoro.  The patient was stable and had a full arc of motion without mechanical block to motion which was noted preoperatively.  Following copious irrigation, wound was closed with Prolene with tourniquet deflated and hemostasis secured.  We are pleased with these findings.  Patient had good splay and no issues.  He was placed in a splint.  He will be discharged home.  He will continue his pain management,  Peri-Colace, vitamin C regime.  We are going to see him back in the office in 12 days.  I want to keep the index and middle finger buddy taped for 4 weeks.  We will remove the pins in 4 weeks.  At 2 weeks, we want to go ahead and make sure that he has a therapy appointment immediately following.  These notes have been discussed and all questions have been encouraged and answered.  Should any problems arise, he will notify us.  It was an absolute pleasure seeing and treating him today.  We will let him at 2 weeks begin aggressive PIP and DIP range of motion, but I am going to keep the MCP in a position of function which will be approximately 50-70 degrees of flexion.  I have discussed with him these issues at length, today's notes, etc.  It was an absolute pleasure seeing him today and participate in his care.  This fairly complex fracture went together pretty well and I felt the fixation was stable enough to go ahead and proceed with postop algorithm as noted above.     Dionne Ano. Amanda Pea, M.D.     [facility]  D:  06/30/2012  T:  06/30/2012  Job:  [id number]

## 2012-06-30 NOTE — Op Note (Signed)
See Dictation # [id number]  Michael Severin MD

## 2012-07-02 NOTE — Progress Notes (Signed)
Patient advised pain was 8/10 without numbness on tingling in extremity recommended he contact Dr. Amanda Pea regarding same

## 2012-07-03 ENCOUNTER — Encounter (HOSPITAL_COMMUNITY): Payer: Self-pay | Admitting: Orthopedic Surgery

## 2012-07-26 DIAGNOSIS — E7800 Pure hypercholesterolemia, unspecified: Secondary | ICD-10-CM | POA: Insufficient documentation

## 2012-07-26 DIAGNOSIS — E1069 Type 1 diabetes mellitus with other specified complication: Secondary | ICD-10-CM | POA: Insufficient documentation

## 2012-07-26 DIAGNOSIS — E782 Mixed hyperlipidemia: Secondary | ICD-10-CM

## 2012-10-11 ENCOUNTER — Encounter (HOSPITAL_BASED_OUTPATIENT_CLINIC_OR_DEPARTMENT_OTHER): Payer: BC Managed Care – PPO | Attending: Internal Medicine

## 2012-10-11 DIAGNOSIS — E1069 Type 1 diabetes mellitus with other specified complication: Secondary | ICD-10-CM | POA: Insufficient documentation

## 2012-10-11 DIAGNOSIS — L97509 Non-pressure chronic ulcer of other part of unspecified foot with unspecified severity: Secondary | ICD-10-CM | POA: Insufficient documentation

## 2012-10-12 NOTE — Progress Notes (Signed)
[facility]  NAME:  TEDDRICK, MALLARI NO.:  [id number]  MEDICAL RECORD NO.:  [id number]      DATE OF BIRTH:  [birth year]  PHYSICIAN:  Maxwell Caul, M.D. VISIT DATE:  10/11/2012                                  OFFICE VISIT   HISTORY:  Mr. Kunzler comes in for a review of a wound on his right plantar foot over the second right metatarsal head.  He states this started as a small blister 2 months ago.  He "yanked" the skin off and he has been left with a wound since then.  He has been seen at [facility] and was being seen weekly there, but has not been there in 3 weeks.  I am not exactly sure of the issue here.  He tells me that he has not had x-rays or cultures.  He has not been taking antibiotics.  He has not been systemically unwell.  Over the last 3 weeks, he has been soaking this in salt water.  The patient is a type 1 diabetic with neuropathy.  PAST MEDICAL HISTORY:  Type 1 diabetes, hypercholesterolemia in July 2014.  He had left index finger surgery and left knee arthroscopy.  MEDICATIONS:  Current medications include atorvastatin 40 daily, Lantus 45 units subcu daily, Humalog sliding scale, Protonix 80 mg daily.  PHYSICAL EXAMINATION:  VITAL SIGNS:  Temperature is 99, pulse 99, respirations 16, blood pressure 142/91.  CBG at 180.  According the patient, recent hemoglobin A1c at 8.5. CIRCULATION:  Peripheral pulses are easily palpable.  ABIs bilaterally done in this clinic were 1.1.  The area in question is over the right second metatarsal head on the plantar aspect.  This was initially a small wound with surrounding callus, slightly malodorous.  He underwent a surgical debridement with a #10 scalpel and pickups.  Hemostasis with silver nitrate.  The base of the wound was cultured after debridement.  No antibiotics were prescribed.  IMPRESSION:  [place] grade 2 diabetic foot ulcer on the plantar aspect of his  right foot as described.  I have sent him for an x-ray, CBC diff, and sedimentation rate.  As I mentioned, there was an odor.  I did a culture of the base of the wound after debridement, however, I did not put him empirically on antibiotics.  The wound was dressed with silver alginate, off loaded with flet pads and wrapped in a Kerlix .  I did discuss aggressive offloading with the patient, however, he needs to work and simply could not take time off work for fear of losing his job.  I have emphasized the issue of offloading with him as best he can within his work environment. He apparently has new diabetic footwear made at [employer] and that may help as well.  We will see him back in a week's time.          ______________________________ Maxwell Caul, M.D.     MGR/MEDQ  D:  10/11/2012  T:  10/12/2012  Job:  [id number]

## 2012-10-16 ENCOUNTER — Ambulatory Visit (HOSPITAL_COMMUNITY)
Admission: RE | Admit: 2012-10-16 | Discharge: 2012-10-16 | Disposition: A | Discharge status: Home / Self Care | Payer: BC Managed Care – PPO | Source: Ambulatory Visit | Attending: Internal Medicine | Admitting: Internal Medicine

## 2012-10-16 ENCOUNTER — Other Ambulatory Visit (HOSPITAL_BASED_OUTPATIENT_CLINIC_OR_DEPARTMENT_OTHER): Payer: Self-pay | Admitting: Internal Medicine

## 2012-10-16 DIAGNOSIS — R52 Pain, unspecified: Secondary | ICD-10-CM

## 2012-10-16 DIAGNOSIS — M7989 Other specified soft tissue disorders: Secondary | ICD-10-CM | POA: Insufficient documentation

## 2012-10-16 DIAGNOSIS — A4902 Methicillin resistant Staphylococcus aureus infection, unspecified site: Secondary | ICD-10-CM | POA: Insufficient documentation

## 2012-10-16 DIAGNOSIS — X58XXXA Exposure to other specified factors, initial encounter: Secondary | ICD-10-CM | POA: Insufficient documentation

## 2012-10-16 DIAGNOSIS — S8990XA Unspecified injury of unspecified lower leg, initial encounter: Secondary | ICD-10-CM | POA: Insufficient documentation

## 2012-10-16 DIAGNOSIS — S99919A Unspecified injury of unspecified ankle, initial encounter: Secondary | ICD-10-CM | POA: Insufficient documentation

## 2012-10-16 LAB — CBC WITH DIFFERENTIAL/PLATELET
Basophils Absolute: 0 10*3/uL (ref 0.0–0.1)
Eosinophils Relative: 2 % (ref 0–5)
HCT: 40.2 % (ref 39.0–52.0)
Lymphocytes Relative: 18 % (ref 12–46)
MCV: 88 fL (ref 78.0–100.0)
Monocytes Absolute: 0.9 10*3/uL (ref 0.1–1.0)
RDW: 12.3 % (ref 11.5–15.5)
WBC: 7.1 10*3/uL (ref 4.0–10.5)

## 2012-10-16 LAB — SEDIMENTATION RATE: Sed Rate: 12 mm/hr (ref 0–16)

## 2013-01-10 HISTORY — PX: LAPAROSCOPIC CHOLECYSTECTOMY: SUR755

## 2013-01-10 HISTORY — PX: UMBILICAL HERNIA REPAIR: SHX196

## 2013-05-03 DIAGNOSIS — I10 Essential (primary) hypertension: Secondary | ICD-10-CM

## 2013-05-03 DIAGNOSIS — I152 Hypertension secondary to endocrine disorders: Secondary | ICD-10-CM | POA: Insufficient documentation

## 2013-05-03 DIAGNOSIS — E109 Type 1 diabetes mellitus without complications: Secondary | ICD-10-CM

## 2013-05-03 DIAGNOSIS — Z6841 Body Mass Index (BMI) 40.0 and over, adult: Secondary | ICD-10-CM | POA: Insufficient documentation

## 2013-05-03 DIAGNOSIS — E1159 Type 2 diabetes mellitus with other circulatory complications: Secondary | ICD-10-CM | POA: Insufficient documentation

## 2013-05-03 HISTORY — DX: Essential (primary) hypertension: I10

## 2013-10-30 ENCOUNTER — Encounter: Payer: Self-pay | Admitting: Podiatrist

## 2013-10-30 ENCOUNTER — Ambulatory Visit (INDEPENDENT_AMBULATORY_CARE_PROVIDER_SITE_OTHER): Payer: BC Managed Care – PPO | Admitting: Podiatrist

## 2013-10-30 ENCOUNTER — Ambulatory Visit (INDEPENDENT_AMBULATORY_CARE_PROVIDER_SITE_OTHER): Payer: BC Managed Care – PPO

## 2013-10-30 DIAGNOSIS — R52 Pain, unspecified: Secondary | ICD-10-CM

## 2013-10-30 DIAGNOSIS — M14671 Charcot's joint, right ankle and foot: Secondary | ICD-10-CM

## 2013-10-30 NOTE — Progress Notes (Signed)
   Subjective:    Patient ID: Michael Hutchinson, male    DOB: 1975/05/10, 38 y.o.   MRN: [id number]  HPI PT STATED STEPPING DOWN TO THE TRUCK IT FEELS LIKE SOMETHING HIT ME WITH A BAT AND BEEN HURTING FOR 3 MONTHS. THE FOOT IS BEEN THE SAME AND GET AGGRAVATED BY WALKING. TRIED NO TREATMENT.   Review of Systems  All other systems reviewed and are negative.      Objective:   Physical Exam GENERAL APPEARANCE: Alert, conversant. Appropriately groomed. No acute distress.  VASCULAR: Pedal pulses palpable at 2/4 DP and PT bilateral.  Capillary refill time is immediate to all digits,  Proximal to distal cooling it warm to warm.  Digital hair growth is present bilateral  NEUROLOGIC: sensation is absent epicritically and protectively to 5.07 monofilament at 0/5 sites bilateral.  Profound peripheral neuropathy noted.  MUSCULOSKELETAL: swelling, calor and loss or arch height is present on the right foot.  Widening of the foot is also seen especially compared to the left foot.  Multiple signs of charcot foot deformity is present.   DERMATOLOGIC: no open lesions present.  Mild calor and redness present to the right foot.  Left foot is normal  xrays are taken and compared to xray of right foot taken a year ago.  Breakdown at the midfoot noted.  Talus is plantarflexed,  Dislocation at the midtarsal joint is noted especially at the fifth metatarsal base.  Multiple signs of charcot foot deformity and charcot changes are seen.   Assessment & Plan:  Charcot deformity right foot  Plan:  Recommended cast immobilization at today's visit.  Patient declined stating he drove himself to the office today and he has a clutch and there is no way he could drive home.  I offered him a boot to wear in the interim and he declined.  He will be seen back on Friday for the application of a below knee cast.  I discussed that he would likey need to take time off work for the foot to begin to improve.  Discussed the need for a crow  walker as well in the future.  Discussed the seriousness of the condition and that this is a limb threatening condition that could lead to infection and amputation in the future.  Discussed the immediate need for blood sugar control.  He will be seen back for cast application and will call to have this done ASAP.

## 2013-10-30 NOTE — Patient Instructions (Signed)
Charcot Foot What Is Charcot Foot? Charcot foot is a condition causing weakening of the bones in the foot that can occur in people who have significant nerve damage (neuropathy). The bones are weakened enough to fracture, and with continued walking the foot eventually changes shape. As the disorder progresses, the joints collapse and the foot takes on an abnormal shape, such as a rocker-bottom appearance. Charcot foot is a very serious condition that can lead to severe deformity, disability, and even amputation. Because of its seriousness, it is important that patients with diabetes-a disease often associated with neuropathy-take preventive measures and seek immediate care if signs or symptoms appear. Causes Charcot foot develops as a result of neuropathy, which decreases sensation and the ability to feel temperature, pain, or trauma. Because of diminished sensation, the patient may continue to walk-making the injury worse. People with neuropathy (especially those who have had it for a long time) are at risk for developing Charcot foot. In addition, neuropathic patients with a tight Achilles tendon have been shown to have a tendency to develop Charcot foot. Symptoms The symptoms of Charcot foot may include: Warmth to the touch (the affected foot feels warmer than the other)  Redness in the foot  Swelling in the area  Pain or soreness Diagnosis Early diagnosis of Charcot foot is extremely important for successful treatment. To arrive at a diagnosis, the surgeon will examine the foot and ankle and ask about events that may have occurred prior to the symptoms. X-rays and other imaging studies and tests may be ordered. Once treatment begins, x-rays are taken periodically to aid in evaluating the status of the condition. Non-Surgical Treatment It is extremely important to follow the surgeon's treatment plan for Charcot foot. Failure to do so can lead to the loss of a toe, foot, leg, or  life. Non-surgical treatment for Charcot foot consists of: Immobilization. Because the foot and ankle are so fragile during the early stage of Charcot, they must be protected so the weakened bones can repair themselves. Complete non-weightbearing is necessary to keep the foot from further collapsing. The patient will not be able to walk on the affected foot until the surgeon determines it is safe to do so. During this period, the patient may be fitted with a cast, removable boot, or brace, and may be required to use crutches or a wheelchair. It may take the bones several months to heal, although it can take considerably longer in some patients.  Custom shoes and bracing. Shoes with special inserts may be needed after the bones have healed to enable the patient to return to daily activities-as well as help prevent recurrence of Charcot foot, development of ulcers, and possibly amputation. In cases with significant deformity, bracing is also required.  Activity modification. A modification in activity level may be needed to avoid repetitive trauma to both feet. A patient with Charcot in one foot is more likely to develop it in the other foot, so measures must be taken to protect both feet. When is Surgery Needed? In some cases, the Charcot deformity may become severe enough that surgery is necessary. The foot and ankle surgeon will determine the proper timing as well as the appropriate procedure for the individual case. Preventive Care The patient can play a vital role in preventing Charcot foot and its complications by following these measures: Keeping blood sugar levels under control can help reduce the progression of nerve damage in the feet.  Get regular check-ups from a foot and ankle surgeon.  Check both feet every day-and see a surgeon immediately if you notice signs of Charcot foot.  Be careful to avoid injury, such as bumping the foot or overdoing an exercise program.  Follow the surgeon's  instructions for long-term treatment to prevent recurrences, ulcers, and amputation.

## 2013-11-01 ENCOUNTER — Ambulatory Visit (INDEPENDENT_AMBULATORY_CARE_PROVIDER_SITE_OTHER): Payer: BC Managed Care – PPO | Admitting: Podiatry

## 2013-11-01 DIAGNOSIS — M14671 Charcot's joint, right ankle and foot: Secondary | ICD-10-CM

## 2013-11-01 NOTE — Patient Instructions (Signed)
Cast Care The purpose of a cast is to protect and immobilize an injured part of the body. This may be necessary after fractures, surgery, or other injuries. Splints are another form of immobilization; however, they are usually not as rigid as a cast, which accommodates swelling of the injury while still maintaining immobilization. Splints are typically used in the immediate post injury or postoperative period, before changing to a cast.  Before the rigid material of a cast is formed, a layer of padding is first applied to protect the injury. The rigid portion of a cast is created by wrapping gauze saturated with plaster of paris around the injury; alternatively, the cast may be made of fiberglass. During the period of immobilization, a cast may need to be changed multiple times. The length of immobilization is dependent on the severity of the injury and the time needed for healing. This period can vary from a couple weeks to many months. After a cast is created, radiographs (X-rays) through the cast determine if a satisfactory alignment of the bones was achieved. Radiographs may also be used throughout the healing period to check for signs of bone healing.  CARE OF THE CAST   Allow the cast to dry and harden completely before applying any pressure to it.  Applying pressure too early may create a point of excessive pressure on the skin, which may increase the risk of forming an ulcer. Drying time depends on the type of cast but may last as long as 24 hours.  When a cast gets wet, a soft area may appear. If this happens accidentally, return to the doctor's office, emergency room, or outpatient surgical facility as soon as possible for repairs or changing of the cast.  Do not get sand in the cast.  Do not place anything inside the cast. This includes items intended to scratch an area of skin that itches. ITCHING INSIDE A CAST   It is very common for a person with a cast to experience an itching  sensation under the cast.  Do not scratch any area under the cast even if the area is within reach. The skin under a cast in an environment of increased risk for injury.  Do not put anything in the cast.  If there is no wound, such as an incision from surgery, you may sprinkle cornstarch into the cast to relieve itching.  If a wound is present under the cast, consult your caregiver for pain medication or medication to reduce itching.  Using a hairdryer (on the cold setting) is a useful technique for reducing an itching sensation. CARE OF THE PATIENT IN A CAST It is important to elevate the body part that is in a cast to a level equal to or above that of the heart whenever possible. Elevating the injured body part may reduce the likelihood of swelling. Elevation of a leg in a cast may be achieved by resting the leg on a pillow when in bed and on a footstool or chair when sitting. For an arm in a cast, rest the arm on a pillow placed on the chest.  No matter how well one follows the necessary precautions, excessive swelling may occur under the cast. Signs and symptoms of excessive swelling include:  Severe and persistent pain.  Change in color of the tissues beyond the end of the cast, such as a change to blue or gray under the nails of the fingers or toes.  Coldness of the tissues beyond the cast when   the rest of the body is warm.  Numbness or complete loss of feeling in the skin beyond the cast.  Feeling of tightness under the cast after it dries.  Swelling of the tissue to a greater extent than was present before the cast was applied.  For a leg cast, inability to raise the big toe. If any of these signs or symptoms occur, contact your caregiver or an emergency room as soon as possible for treatment.  Infection Inside a Cast On occasion, an injury may become infected during healing. The most important way to fight an infection is to detect it early; however, early detection may be  difficult if the infected area is covered by a cast. Infection should be reported immediately to your caregiver. The following are common signs and symptoms of infection:   Foul smell.  Fever greater than 101F (38.3C) (may be accompanied by a general ill feeling).  Leakage of fluid through the cast.  Increasing pain or soreness of the skin under the cast. Bathing with a Cast Bathing is often a difficult task with a cast. The cast must be kept dry at all times, unless otherwise specified by your caregiver. If the cast is on a limb, such as your arm or leg, it is often easier to take a bath with the extremity in a cast propped up on the side of the tub or a chair, out of the water. If the cast is on the trunk of the body, you should take sponge baths until the cast is removed.  Document Released: 12/27/2004 Document Revised: 05/13/2013 Document Reviewed: 04/10/2008 Exit[employer] Patient Information 2015 [employer]. This information is not intended to replace advice given to you by your health care provider. Make sure you discuss any questions you have with your health care provider.  

## 2013-11-04 NOTE — Progress Notes (Signed)
Patient ID: Michael Hutchinson, male   DOB: 04-Jan-1976, 38 y.o.   MRN: [id number]  Subjective: Michael Hutchinson returns to the office today for cast application to right lower extremity due to Charcot. Michael Hutchinson in the acute change since last. No other complaints at this time. He is accompanied at today's visit by family.   Objective: AAO 3, NAD DP/PT pulses palpable bilaterally, CRT less than 3 seconds Decreased protective sensation was sometimes lysing monofilament, decreased operatory sensation. Mild edema to right foot with increase in warmth over the area. Decrease in medial arch height and widening of the foot. No open lesions or pre-ulcer lesions. Calf pain with compression, swelling, warmth, erythema.  Assessment: 38 year old male with right foot Charcot deformity presents for cast application.  Plan: -Treatment options discussed including alternatives, risks, complications. -Well-padded below-knee fiberglass cast was applied and sutured to pad all bony prominences. Risks, locations of casting including but not limited to DVT were discussed for which she was directed to go directly to the emergency room if any are to occur. -Patient wishes follow-up with Dr. Irving Hutchinson. Follow-up in 2 weeks for cast change. Call sooner with  any questions, concerns, change in symptoms.

## 2013-11-15 ENCOUNTER — Ambulatory Visit (INDEPENDENT_AMBULATORY_CARE_PROVIDER_SITE_OTHER): Payer: BC Managed Care – PPO | Admitting: Podiatrist

## 2013-11-15 DIAGNOSIS — M14671 Charcot's joint, right ankle and foot: Secondary | ICD-10-CM

## 2013-11-19 NOTE — Progress Notes (Signed)
Subjective: Patient presents today for cast change due to Charcot changes right foot. He presents today with family. He denies any problems or concerns with the previous cast application. He relates however that he would like to have the cast removed for good today and wants to see if this is possible.  Objective: Neurovascular status is unchanged with vascular status intact in peripheral neuropathy noted. Charcot changes are noted to the right foot with flattening of the arch and widening of the foot. Mild warmth is palpated.  Assessment: Charcot arthropathy right foot  Plan: Well-padded below-knee cast applied to the right at today's visit. Discussed he will likely need to wear cast for a total of 6-8 weeks. We will then get him into a Crow walker. I will see him back in 3 weeks for removal of the cast. At that time we will likely get him a prescription for a Crow walker to wear. If any concerns arise he will call otherwise he'll be seen back in 3 weeks.

## 2013-11-25 ENCOUNTER — Encounter: Payer: Self-pay | Admitting: Podiatry

## 2013-11-25 ENCOUNTER — Ambulatory Visit (INDEPENDENT_AMBULATORY_CARE_PROVIDER_SITE_OTHER): Payer: BC Managed Care – PPO | Admitting: Podiatry

## 2013-11-25 DIAGNOSIS — M14679 Charcot's joint, unspecified ankle and foot: Secondary | ICD-10-CM | POA: Insufficient documentation

## 2013-11-25 DIAGNOSIS — M14671 Charcot's joint, right ankle and foot: Secondary | ICD-10-CM

## 2013-11-25 HISTORY — DX: Charcot's joint, unspecified ankle and foot: M14.679

## 2013-11-25 NOTE — Progress Notes (Signed)
Patient ID: Michael PupaWilliam C Hutchinson, male   DOB: 23-Nov-1975, 38 y.o.   MRN: [id number]  Subjective: 38 year old male returns the office today as he states that over the weekend he got the bottom of his cast wet. He presents today for cast change. He denies any other acute changes since last appointment. No other complaints at this time. Denies any systemic complaints as fevers, chills, nausea, vomiting.  Objective: AAO 3, NAD DP/PT pulses palpable bilaterally, CRT less than 3 seconds Decreased protective sensation with Simms Weinstein monofilament, decreased vibratory sensation. Cast is removed and there is no underlying ulceration. There is mild edema to the right foot and ankle. There is flatfoot deformity present and Weinstein of the foot compared to the contralateral extremity. There is no significant rocker-bottom deformity present. No calf pain with compression, swelling, warmth, erythema. No maceration of the skin.  Assessment: 38 year old male right foot Charcot presents for casting due to wet cast.  Plan: -Treatment options were discussed including alternatives, risks, complications. -Cast was removed and a new well-padded below knee fiberglass cast was applied. -Patient had questions regarding surgical intervention and when he would become necessary. I discussed with him briefly what the surgery would entail versus conservative treatment including bracing/custom shoe gear. -Follow-up as scheduled with Dr. Irving Hutchinson, or sooner if any problems are to arise. In the meantime, call the office in the questions, concerns, change in symptoms.

## 2013-11-25 NOTE — Patient Instructions (Signed)
Cast Care The purpose of a cast is to protect and immobilize an injured part of the body. This may be necessary after fractures, surgery, or other injuries. Splints are another form of immobilization; however, they are usually not as rigid as a cast, which accommodates swelling of the injury while still maintaining immobilization. Splints are typically used in the immediate post injury or postoperative period, before changing to a cast.  Before the rigid material of a cast is formed, a layer of padding is first applied to protect the injury. The rigid portion of a cast is created by wrapping gauze saturated with plaster of paris around the injury; alternatively, the cast may be made of fiberglass. During the period of immobilization, a cast may need to be changed multiple times. The length of immobilization is dependent on the severity of the injury and the time needed for healing. This period can vary from a couple weeks to many months. After a cast is created, radiographs (X-rays) through the cast determine if a satisfactory alignment of the bones was achieved. Radiographs may also be used throughout the healing period to check for signs of bone healing.  CARE OF THE CAST   Allow the cast to dry and harden completely before applying any pressure to it.  Applying pressure too early may create a point of excessive pressure on the skin, which may increase the risk of forming an ulcer. Drying time depends on the type of cast but may last as long as 24 hours.  When a cast gets wet, a soft area may appear. If this happens accidentally, return to the doctor's office, emergency room, or outpatient surgical facility as soon as possible for repairs or changing of the cast.  Do not get sand in the cast.  Do not place anything inside the cast. This includes items intended to scratch an area of skin that itches. ITCHING INSIDE A CAST   It is very common for a person with a cast to experience an itching  sensation under the cast.  Do not scratch any area under the cast even if the area is within reach. The skin under a cast in an environment of increased risk for injury.  Do not put anything in the cast.  If there is no wound, such as an incision from surgery, you may sprinkle cornstarch into the cast to relieve itching.  If a wound is present under the cast, consult your caregiver for pain medication or medication to reduce itching.  Using a hairdryer (on the cold setting) is a useful technique for reducing an itching sensation. CARE OF THE PATIENT IN A CAST It is important to elevate the body part that is in a cast to a level equal to or above that of the heart whenever possible. Elevating the injured body part may reduce the likelihood of swelling. Elevation of a leg in a cast may be achieved by resting the leg on a pillow when in bed and on a footstool or chair when sitting. For an arm in a cast, rest the arm on a pillow placed on the chest.  No matter how well one follows the necessary precautions, excessive swelling may occur under the cast. Signs and symptoms of excessive swelling include:  Severe and persistent pain.  Change in color of the tissues beyond the end of the cast, such as a change to blue or gray under the nails of the fingers or toes.  Coldness of the tissues beyond the cast when   the rest of the body is warm.  Numbness or complete loss of feeling in the skin beyond the cast.  Feeling of tightness under the cast after it dries.  Swelling of the tissue to a greater extent than was present before the cast was applied.  For a leg cast, inability to raise the big toe. If any of these signs or symptoms occur, contact your caregiver or an emergency room as soon as possible for treatment.  Infection Inside a Cast On occasion, an injury may become infected during healing. The most important way to fight an infection is to detect it early; however, early detection may be  difficult if the infected area is covered by a cast. Infection should be reported immediately to your caregiver. The following are common signs and symptoms of infection:   Foul smell.  Fever greater than 101F (38.3C) (may be accompanied by a general ill feeling).  Leakage of fluid through the cast.  Increasing pain or soreness of the skin under the cast. Bathing with a Cast Bathing is often a difficult task with a cast. The cast must be kept dry at all times, unless otherwise specified by your caregiver. If the cast is on a limb, such as your arm or leg, it is often easier to take a bath with the extremity in a cast propped up on the side of the tub or a chair, out of the water. If the cast is on the trunk of the body, you should take sponge baths until the cast is removed.  Document Released: 12/27/2004 Document Revised: 05/13/2013 Document Reviewed: 04/10/2008 Exit[employer] Patient Information 2015 [employer]. This information is not intended to replace advice given to you by your health care provider. Make sure you discuss any questions you have with your health care provider.  

## 2013-12-04 ENCOUNTER — Ambulatory Visit: Payer: BC Managed Care – PPO | Admitting: Podiatrist

## 2013-12-04 ENCOUNTER — Ambulatory Visit (INDEPENDENT_AMBULATORY_CARE_PROVIDER_SITE_OTHER): Payer: BC Managed Care – PPO | Admitting: Podiatrist

## 2013-12-04 ENCOUNTER — Ambulatory Visit (INDEPENDENT_AMBULATORY_CARE_PROVIDER_SITE_OTHER): Payer: BC Managed Care – PPO

## 2013-12-04 ENCOUNTER — Encounter: Payer: Self-pay | Admitting: Podiatry

## 2013-12-04 VITALS — BP 138/81 | HR 72 | Resp 16

## 2013-12-04 DIAGNOSIS — M14671 Charcot's joint, right ankle and foot: Secondary | ICD-10-CM

## 2013-12-04 NOTE — Patient Instructions (Signed)
Referral will be made to Dr. Vanita Ingles

## 2013-12-04 NOTE — Progress Notes (Signed)
Patient ID: Michael Hutchinson, male   DOB: 1975/11/24, 38 y.o.   MRN: [id number]  Subjective: 38 year old male returns the office today for follow up of Charcot foot deformity right foot.  He has been in a below knee cast since 11/04/2013 and would like to know when he can get the cast off for good. He denies any other acute changes since last appointment. States the cast is too loose and is bothering his heel.  He has been using his rolling knee scooter.   Denies any systemic complaints as fevers, chills, nausea, vomiting.  Objective: AAO 3, NAD DP/PT pulses palpable bilaterally, CRT less than 3 seconds Decreased protective sensation with Simms Weinstein monofilament, decreased vibratory sensation. Cast is removed and there is no underlying ulceration. There is mild edema to the right foot and ankle. There is flatfoot deformity present and decreased arch of the foot compared to the contralateral extremity. There is no significant rocker-bottom deformity present. No calf pain with compression, swelling, warmth, erythema. No maceration of the skin.  xrays today confirm charcot changes still appear active.  Talus is plantarflexed and deformity at the midfoot is present.    Assessment: 38 year old male right foot Charcot deformity  Plan: -Treatment options were discussed including alternatives, risks, complications.  Discussed that due to his young age and his desire to get back to work I would recommend a follow up with Dr. Vanita Ingles to discuss his options for reconstruction if it is possible for him.  -Cast was removed and a new well-padded below knee fiberglass cast was applied. -Will set up a referral to Dr. Eilleen Kempf.  If he has any concerns prior to his visit, he will call our office.

## 2013-12-10 ENCOUNTER — Telehealth: Payer: Self-pay | Admitting: *Deleted

## 2013-12-10 NOTE — Telephone Encounter (Signed)
-----   Message from Delories Heinz, [place] sent at 12/04/2013  6:02 PM EST ----- Regarding: referral to Dr. Vanita Ingles at [facility] Would you send a referral to Dr. Eilleen Kempf for Michael Hutchinson.  He has a charcot foot deformity and would like to consider his surgical reconstruction options.   The note will be ready to send.  Can you also burn a cd to send over of his last xrays with [place].  Thanks!  E

## 2013-12-11 ENCOUNTER — Other Ambulatory Visit: Payer: Self-pay | Admitting: Podiatrist

## 2013-12-11 DIAGNOSIS — M14671 Charcot's joint, right ankle and foot: Secondary | ICD-10-CM

## 2013-12-17 NOTE — Telephone Encounter (Signed)
I called to schedule the patient an appointment.  "He's already been seen, we saw him on December 12 and his next appointment is 12/26."  I asked for the fax number and the address so I can send a disk of the x-ray.  "The fax number is [phone] and the address is [address]  [place], [place] [address].  I told her I would send notes and a x-ray disk.

## 2014-09-26 DIAGNOSIS — S99921S Unspecified injury of right foot, sequela: Secondary | ICD-10-CM | POA: Insufficient documentation

## 2015-04-20 ENCOUNTER — Other Ambulatory Visit: Payer: Self-pay | Admitting: Ophthalmology

## 2015-04-20 DIAGNOSIS — H469 Unspecified optic neuritis: Secondary | ICD-10-CM

## 2015-04-20 DIAGNOSIS — H4600 Optic papillitis, unspecified eye: Secondary | ICD-10-CM | POA: Diagnosis not present

## 2015-04-29 ENCOUNTER — Ambulatory Visit (HOSPITAL_COMMUNITY): Admission: RE | Admit: 2015-04-29 | Payer: BLUE CROSS/BLUE SHIELD | Source: Ambulatory Visit | Admitting: Ophthalmology

## 2015-05-11 ENCOUNTER — Ambulatory Visit (HOSPITAL_COMMUNITY)
Admission: RE | Admit: 2015-05-11 | Discharge: 2015-05-11 | Disposition: A | Discharge status: Home / Self Care | Payer: BLUE CROSS/BLUE SHIELD | Source: Ambulatory Visit | Attending: Ophthalmology | Admitting: Ophthalmology

## 2015-05-11 ENCOUNTER — Ambulatory Visit (HOSPITAL_COMMUNITY): Payer: BLUE CROSS/BLUE SHIELD

## 2015-05-11 DIAGNOSIS — H53132 Sudden visual loss, left eye: Secondary | ICD-10-CM | POA: Diagnosis not present

## 2015-05-11 DIAGNOSIS — M14672 Charcot's joint, left ankle and foot: Secondary | ICD-10-CM | POA: Diagnosis not present

## 2015-05-11 DIAGNOSIS — H469 Unspecified optic neuritis: Secondary | ICD-10-CM | POA: Diagnosis not present

## 2015-05-11 DIAGNOSIS — G9389 Other specified disorders of brain: Secondary | ICD-10-CM | POA: Diagnosis not present

## 2015-05-11 LAB — CREATININE, SERUM
Creatinine, Ser: 0.99 mg/dL (ref 0.61–1.24)
GFR calc Af Amer: >60 mL/min (ref >60)
GFR calc non Af Amer: >60 mL/min (ref >60)

## 2015-05-11 MED ORDER — GADOBENATE DIMEGLUMINE 529 MG/ML IV SOLN
20.0000 mL | Freq: Once | INTRAVENOUS | Status: AC | PRN
  Administered 2015-05-11: 20 mL via INTRAVENOUS

## 2015-05-20 DIAGNOSIS — E113312 Type 2 diabetes mellitus with moderate nonproliferative diabetic retinopathy with macular edema, left eye: Secondary | ICD-10-CM | POA: Diagnosis not present

## 2015-05-20 DIAGNOSIS — H4600 Optic papillitis, unspecified eye: Secondary | ICD-10-CM | POA: Diagnosis not present

## 2015-06-10 DIAGNOSIS — E1042 Type 1 diabetes mellitus with diabetic polyneuropathy: Secondary | ICD-10-CM | POA: Diagnosis not present

## 2015-06-10 DIAGNOSIS — M14672 Charcot's joint, left ankle and foot: Secondary | ICD-10-CM | POA: Diagnosis not present

## 2015-07-01 DIAGNOSIS — E113312 Type 2 diabetes mellitus with moderate nonproliferative diabetic retinopathy with macular edema, left eye: Secondary | ICD-10-CM | POA: Diagnosis not present

## 2015-07-13 DIAGNOSIS — M14672 Charcot's joint, left ankle and foot: Secondary | ICD-10-CM | POA: Diagnosis not present

## 2015-07-13 DIAGNOSIS — E1042 Type 1 diabetes mellitus with diabetic polyneuropathy: Secondary | ICD-10-CM | POA: Diagnosis not present

## 2015-07-16 DIAGNOSIS — H43393 Other vitreous opacities, bilateral: Secondary | ICD-10-CM | POA: Diagnosis not present

## 2015-08-07 DIAGNOSIS — E113212 Type 2 diabetes mellitus with mild nonproliferative diabetic retinopathy with macular edema, left eye: Secondary | ICD-10-CM | POA: Diagnosis not present

## 2015-08-11 DIAGNOSIS — E1042 Type 1 diabetes mellitus with diabetic polyneuropathy: Secondary | ICD-10-CM | POA: Diagnosis not present

## 2015-08-11 DIAGNOSIS — M25512 Pain in left shoulder: Secondary | ICD-10-CM | POA: Diagnosis not present

## 2015-08-11 DIAGNOSIS — M25511 Pain in right shoulder: Secondary | ICD-10-CM | POA: Diagnosis not present

## 2015-08-11 DIAGNOSIS — G8929 Other chronic pain: Secondary | ICD-10-CM | POA: Diagnosis not present

## 2015-08-11 DIAGNOSIS — M25572 Pain in left ankle and joints of left foot: Secondary | ICD-10-CM | POA: Diagnosis not present

## 2015-08-19 ENCOUNTER — Ambulatory Visit: Payer: BLUE CROSS/BLUE SHIELD | Admitting: Family Medicine

## 2015-08-25 ENCOUNTER — Encounter: Payer: Self-pay | Admitting: Family Medicine

## 2015-08-25 ENCOUNTER — Ambulatory Visit (INDEPENDENT_AMBULATORY_CARE_PROVIDER_SITE_OTHER): Payer: BLUE CROSS/BLUE SHIELD | Admitting: Family Medicine

## 2015-08-25 DIAGNOSIS — E109 Type 1 diabetes mellitus without complications: Secondary | ICD-10-CM

## 2015-08-25 MED ORDER — ATORVASTATIN CALCIUM 40 MG PO TABS: 40.0000 mg | ORAL_TABLET | Freq: Every day | ORAL | 1 refills | Status: DC

## 2015-08-25 MED ORDER — LOSARTAN POTASSIUM 50 MG PO TABS: 50.0000 mg | ORAL_TABLET | Freq: Every day | ORAL | 3 refills | Status: DC

## 2015-08-25 MED ORDER — INSULIN GLARGINE 100 UNIT/ML ~~LOC~~ SOLN: 52.0000 [IU] | Freq: Every day | SUBCUTANEOUS | 0 refills | Status: DC

## 2015-08-25 NOTE — Patient Instructions (Addendum)
Get your new meds at the pharmacy.  Try to monitor your BP at home and write it down.    BS monitoring is a must in order for [place] to know what to do with your insulin doses and better control your sugars.  Please write it down, log and bring in next office visit. It could also just bring in your glucometer as well.  Please let me know if he would like to go to a diabetes educator for further teaching about foods etc. but below you'll see educational materials for you.     Type 1 Diabetes Mellitus, Adult Type 1 diabetes mellitus, often simply referred to as diabetes, is a long-term (chronic) disease. It occurs when the islet cells in the pancreas that make insulin (a hormone) are destroyed and can no longer make insulin. Insulin is needed to move sugars from food into the tissue cells. The tissue cells use the sugars for energy. In people with type 1 diabetes, the sugars build up in the blood instead of going into the tissue cells. As a result, high blood sugar (hyperglycemia) develops. Without insulin, the body breaks down fat cells for the needed energy. This breakdown of fat cells produces acid chemicals (ketones), which increases the acid levels in the body. The effect of either high ketone or high sugar (glucose) levels can be life-threatening.  Type 1 diabetes was also previously called juvenile diabetes. It most often occurs before the age of 8, but it can occur at any age. RISK FACTORS A person is predisposed to developing type 1 diabetes if someone in his or her family has the disease and is exposed to certain additional environmental triggers.  SYMPTOMS  Symptoms of type 1 diabetes may develop gradually over days to weeks or suddenly. The symptoms occur due to hyperglycemia. The symptoms can include:   Increased thirst (polydipsia).  Increased urination (polyuria).  Increased urination during the night (nocturia).  Weight loss. This weight loss may be rapid.  Frequent, recurring  infections.  Tiredness (fatigue).  Weakness.  Vision changes, such as blurred vision.  Fruity smell to your breath.  Abdominal pain.  Nausea or vomiting.  An open skin wound (ulcer). DIAGNOSIS  Type 1 diabetes is diagnosed when symptoms of diabetes are present and when blood glucose levels are increased. Your blood glucose level may be checked by one or more of the following blood tests:  A fasting blood glucose test. You will not be allowed to eat for at least 8 hours before a blood sample is taken.  A random blood glucose test. Your blood glucose is checked at any time of the day regardless of when you ate.  A hemoglobin A1c blood glucose test. A hemoglobin A1c test provides information about blood glucose control over the previous 3 months. TREATMENT  Although type 1 diabetes cannot be prevented, it can be managed with insulin, diet, and exercise.  You will need to take insulin daily to keep blood glucose in the desired range.  You will need to match insulin dosing with exercise and healthy food choices. Generally, the goal of treatment is to maintain a pre-meal (preprandial) blood glucose level of 80-130 mg/dL. HOME CARE INSTRUCTIONS   Have your hemoglobin A1c level checked twice a year.  Perform daily blood glucose monitoring as directed by your health care provider.  Monitor urine ketones when you are ill and as directed by your health care provider.  Take your insulin as directed by your health care provider to  maintain your blood glucose level in the desired range.  Never run out of insulin. It is needed every day.  Adjust insulin based on your intake of carbohydrates. Carbohydrates can raise blood glucose levels but need to be included in your diet. Carbohydrates provide vitamins, minerals, and fiber, which are an essential part of a healthy diet. Carbohydrates are found in fruits, vegetables, whole grains, dairy products, legumes, and foods containing added  sugars.  Eat healthy foods. Alternate 3 meals with 3 snacks.  Maintain a healthy weight.  Carry a medical alert card or wear your medical alert jewelry.  Carry a 15-gram carbohydrate snack with you at all times to treat low blood glucose (hypoglycemia). Some examples of 15-gram carbohydrate snacks include:  Glucose tablets, 3 or 4.  Glucose gel, 15-gram tube.  Raisins, 2 tablespoons (24 grams).  Jelly beans, 6.  Animal crackers, 8.  Fruit juice, regular soda, or low-fat milk, 4 ounces (120 mL).  Gummy treats, 9.  Recognize hypoglycemia. Hypoglycemia occurs with blood glucose levels of 70 mg/dL and below. The risk for hypoglycemia increases when fasting or skipping meals, during or after intense exercise, and during sleep. Hypoglycemia symptoms can include:  Tremors or shakes.  Decreased ability to concentrate.  Sweating.  Increased heart rate.  Headache.  Dry mouth.  Hunger.  Irritability.  Anxiety.  Restless sleep.  Altered speech or coordination.  Confusion.  Treat hypoglycemia promptly. If you are alert and able to safely swallow, follow the 15:15 rule:  Take 15-20 grams of rapid-acting glucose or carbohydrate. Rapid-acting options include glucose gel, glucose tablets, or 4 ounces (120 mL) of fruit juice, regular soda, or low-fat milk.  Check your blood glucose level 15 minutes after taking the glucose.  Take 15-20 grams more of glucose if the repeat blood glucose level is still 70 mg/dL or below.  Eat a meal or snack within 1 hour once blood glucose levels return to normal.  Be alert to polyuria and polydipsia, which are early signs of hyperglycemia. An early awareness of hyperglycemia allows for prompt treatment. Treat hyperglycemia as directed by your health care provider.  Exercise regularly as directed by your health care provider. This includes:  Stretching and performing strength training exercises, such as yoga or weight lifting, at least 2  times per week.  Performing a total of at least 150 minutes of moderate-intensity exercise each week, such as brisk walking or water aerobics.  Exercising at least 3 days per week, making sure you allow no more than 2 consecutive days to pass without exercising.  Avoiding long periods of inactivity (90 minutes or more). When you have to spend an extended period of time sitting down, take frequent breaks to walk or stretch.  Adjust your insulin dosing and food intake as needed if you start a new exercise or sport.  Follow your sick-day plan at any time you are unable to eat or drink as usual.   Do not use any tobacco products including cigarettes, chewing tobacco, or electronic cigarettes. If you need help quitting, ask your health care provider.  Limit alcohol intake to no more than 1 drink per day for nonpregnant women and 2 drinks per day for men. You should drink alcohol only when you are also eating food. Talk with your health care provider about whether alcohol is safe for you. Tell your health care provider if you drink alcohol several times a week.  Keep all follow-up visits as directed by your health care provider.  Schedule  an eye exam within 5 years of diagnosis and then annually.  Perform daily skin and foot care. Examine your skin and feet daily for cuts, bruises, redness, nail problems, bleeding, blisters, or sores. A foot exam should be done by a health care provider 5 years after diagnosis, and then every year after the first exam.  Brush your teeth and gums at least twice a day and floss at least once a day. Follow up with your dentist regularly.  Share your diabetes management plan with your workplace or school.  Keep your immunizations up to date. It is recommended that you receive a flu (influenza) vaccine every year. It is also recommended that you receive a pneumonia (pneumococcal) vaccine. If you are 38 years of age or older and have never received a pneumonia  vaccine, this vaccine may be given as a series of two separate shots. Ask your health care provider which additional vaccines may be recommended.  Learn to manage stress.  Obtain ongoing diabetes education and support as needed.  Participate in or seek rehabilitation as needed to maintain or improve independence and quality of life. Request a physical or occupational therapy referral if you are having foot or hand numbness, or difficulties with grooming, dressing, eating, or physical activity. SEEK MEDICAL CARE IF:   You are unable to eat food or drink fluids for more than 6 hours.  You have nausea and vomiting for more than 6 hours.  Your blood glucose level is over 240 mg/dL.  There is a change in mental status.  You develop an additional serious illness.  You have diarrhea for more than 6 hours.  You have been sick or have had a fever for a couple of days and are not getting better.  You have pain during any physical activity. SEEK IMMEDIATE MEDICAL CARE IF:  You have difficulty breathing.  You have moderate to large ketone levels. MAKE SURE YOU:  Understand these instructions.  Will watch your condition.  Will get help right away if you are not doing well or get worse.   This information is not intended to replace advice given to you by your health care provider. Make sure you discuss any questions you have with your health care provider.   Document Released: 12/25/1999 Document Revised: 09/17/2014 Document Reviewed: 07/26/2011 Elsevier Interactive Patient Education 2016 Elsevier Inc.     Basic Carbohydrate Counting for Diabetes Mellitus Carbohydrate counting is a method for keeping track of the amount of carbohydrates you eat. Eating carbohydrates naturally increases the level of sugar (glucose) in your blood, so it is important for you to know the amount that is okay for you to have in every meal. Carbohydrate counting helps keep the level of glucose in your blood  within normal limits. The amount of carbohydrates allowed is different for every person. A dietitian can help you calculate the amount that is right for you. Once you know the amount of carbohydrates you can have, you can count the carbohydrates in the foods you want to eat. Carbohydrates are found in the following foods:  Grains, such as breads and cereals.  Dried beans and soy products.  Starchy vegetables, such as potatoes, peas, and corn.  Fruit and fruit juices.  Milk and yogurt.  Sweets and snack foods, such as cake, cookies, candy, chips, soft drinks, and fruit drinks. CARBOHYDRATE COUNTING There are two ways to count the carbohydrates in your food. You can use either of the methods or a combination of both. Reading  the "Nutrition Facts" on Packaged Food The "Nutrition Facts" is an area that is included on the labels of almost all packaged food and beverages in the [place]. It includes the serving size of that food or beverage and information about the nutrients in each serving of the food, including the grams (g) of carbohydrate per serving.  Decide the number of servings of this food or beverage that you will be able to eat or drink. Multiply that number of servings by the number of grams of carbohydrate that is listed on the label for that serving. The total will be the amount of carbohydrates you will be having when you eat or drink this food or beverage. Learning Standard Serving Sizes of Food When you eat food that is not packaged or does not include "Nutrition Facts" on the label, you need to measure the servings in order to count the amount of carbohydrates.A serving of most carbohydrate-rich foods contains about 15 g of carbohydrates. The following list includes serving sizes of carbohydrate-rich foods that provide 15 g ofcarbohydrate per serving:   1 slice of bread (1 oz) or 1 six-inch tortilla.    of a hamburger bun or English muffin.  4-6 crackers.   cup  unsweetened dry cereal.    cup hot cereal.   cup rice or pasta.    cup mashed potatoes or  of a large baked potato.  1 cup fresh fruit or one small piece of fruit.    cup canned or frozen fruit or fruit juice.  1 cup milk.   cup plain fat-free yogurt or yogurt sweetened with artificial sweeteners.   cup cooked dried beans or starchy vegetable, such as peas, corn, or potatoes.  Decide the number of standard-size servings that you will eat. Multiply that number of servings by 15 (the grams of carbohydrates in that serving). For example, if you eat 2 cups of strawberries, you will have eaten 2 servings and 30 g of carbohydrates (2 servings x 15 g = 30 g). For foods such as soups and casseroles, in which more than one food is mixed in, you will need to count the carbohydrates in each food that is included. EXAMPLE OF CARBOHYDRATE COUNTING Sample Dinner  3 oz chicken breast.   cup of brown rice.   cup of corn.  1 cup milk.   1 cup strawberries with sugar-free whipped topping.  Carbohydrate Calculation Step 1: Identify the foods that contain carbohydrates:   Rice.   Corn.   Milk.   Strawberries. Step 2:Calculate the number of servings eaten of each:   2 servings of rice.   1 serving of corn.   1 serving of milk.   1 serving of strawberries. Step 3: Multiply each of those number of servings by 15 g:   2 servings of rice x 15 g = 30 g.   1 serving of corn x 15 g = 15 g.   1 serving of milk x 15 g = 15 g.   1 serving of strawberries x 15 g = 15 g. Step 4: Add together all of the amounts to find the total grams of carbohydrates eaten: 30 g + 15 g + 15 g + 15 g = 75 g.   This information is not intended to replace advice given to you by your health care provider. Make sure you discuss any questions you have with your health care provider.   Document Released: 12/27/2004 Document Revised: 01/17/2014 Document Reviewed: 11/23/2012 Elsevier  Interactive Patient Education 2016 Elsevier Inc.      Diabetes and Exercise Exercising regularly is important. It is not just about losing weight. It has many health benefits, such as:  Improving your overall fitness, flexibility, and endurance.  Increasing your bone density.  Helping with weight control.  Decreasing your body fat.  Increasing your muscle strength.  Reducing stress and tension.  Improving your overall health. People with diabetes who exercise gain additional benefits because exercise:  Reduces appetite.  Improves the body's use of blood sugar (glucose).  Helps lower or control blood glucose.  Decreases blood pressure.  Helps control blood lipids (such as cholesterol and triglycerides).  Improves the body's use of the hormone insulin by:  Increasing the body's insulin sensitivity.  Reducing the body's insulin needs.  Decreases the risk for heart disease because exercising:  Lowers cholesterol and triglycerides levels.  Increases the levels of good cholesterol (such as high-density lipoproteins [HDL]) in the body.  Lowers blood glucose levels. YOUR ACTIVITY PLAN  Choose an activity that you enjoy, and set realistic goals. To exercise safely, you should begin practicing any new physical activity slowly, and gradually increase the intensity of the exercise over time. Your health care provider or diabetes educator can help create an activity plan that works for you. General recommendations include:  Encouraging children to engage in at least 60 minutes of physical activity each day.  Stretching and performing strength training exercises, such as yoga or weight lifting, at least 2 times per week.  Performing a total of at least 150 minutes of moderate-intensity exercise each week, such as brisk walking or water aerobics.  Exercising at least 3 days per week, making sure you allow no more than 2 consecutive days to pass without  exercising.  Avoiding long periods of inactivity (90 minutes or more). When you have to spend an extended period of time sitting down, take frequent breaks to walk or stretch. RECOMMENDATIONS FOR EXERCISING WITH TYPE 1 OR TYPE 2 DIABETES   Check your blood glucose before exercising. If blood glucose levels are greater than 240 mg/dL, check for urine ketones. Do not exercise if ketones are present.  Avoid injecting insulin into areas of the body that are going to be exercised. For example, avoid injecting insulin into:  The arms when playing tennis.  The legs when jogging.  Keep a record of:  Food intake before and after you exercise.  Expected peak times of insulin action.  Blood glucose levels before and after you exercise.  The type and amount of exercise you have done.  Review your records with your health care provider. Your health care provider will help you to develop guidelines for adjusting food intake and insulin amounts before and after exercising.  If you take insulin or oral hypoglycemic agents, watch for signs and symptoms of hypoglycemia. They include:  Dizziness.  Shaking.  Sweating.  Chills.  Confusion.  Drink plenty of water while you exercise to prevent dehydration or heat stroke. Body water is lost during exercise and must be replaced.  Talk to your health care provider before starting an exercise program to make sure it is safe for you. Remember, almost any type of activity is better than none.   This information is not intended to replace advice given to you by your health care provider. Make sure you discuss any questions you have with your health care provider.   Document Released: 03/19/2003 Document Revised: 05/13/2014 Document Reviewed: 06/05/2012 Elsevier Interactive Patient Education  2016 [employer].

## 2015-08-25 NOTE — Progress Notes (Signed)
New patient office visit note:  Impression and Recommendations:    1. Type 1 diabetes mellitus without complication (HCC)   - Cont Lantus to 52 units nightly.    - Patient has not had his A1c checked since 9\16\16 when it was 9.0 per med records -  Patient understands how to give himself insulin at each meal to cover carbohydrates and uses 1 unit for every 5 g of carbs he eats. He is just unmotivated to do so. - Keep blood sugar log of fasting and before meals; as well as any time he feel bad. Bring in next office visit. - Patient will need to go back to an endocrinologist- Michael. Susette Hutchinson or another- for intensive treatment as we just do not have all the support staff and resources available to [place] that a endocrinologist office can provide to the patient.    Patient would just like to wait until we get his fasting lab work to decide whether or not he needs - Patient declines diabetes education/ nutrition referral - Basic carbohydrate counting for diabetes and general type 1 diabetes mellitus in adults educational handouts given,  handouts on diabetes and exercise   2.   Medical noncompliance:  - Patient completely noncompliant with dietary or lifestyle modifications as well as treatment regiment. - He denies barriers including financial, emotional and just states he is "lazy" and soesn't really care. - I highly encouraged patient to follow-up near future for fasting lab work - I explained to patient that I will not refill medications unless he is coming to see me on a regular basis-  every 3 months minimum!   3.   Morbid obesity:  - Extensive counseling done regarding weight loss and need for dietary modification - Refuses referral to nutrition services. - Encouraged regular exercise.  4.   Hypertension: - Losartan 50 mg daily - Try to monitor blood pressure at home or [facility]  [facility]. if possible    5.    HDL: -  Refill of Lipitor given. -  Routine counseling  performed.  Get your new meds at the pharmacy.  Try to monitor your BP at home and write it down.    BS monitoring is a must in order for [place] to know what to do with your insulin doses and better control your sugars.  Please write it down, log and bring in next office visit.  You could also just bring in your glucometer as well.  Please let me know if he would like to go to a diabetes educator for further teaching about foods etc. but I provided many educational materials for you    Orders Placed This Encounter  Procedures  . Ambulatory referral to Endocrinology    Patient's Medications  New Prescriptions   LOSARTAN (COZAAR) 50 MG TABLET    Take 1 tablet (50 mg total) by mouth daily.  Previous Medications   BLOOD GLUCOSE MONITORING SUPPL (ONE TOUCH ULTRA SYSTEM KIT) W/DEVICE KIT    1 kit by Does not apply route once. Check blood sugar 3 times daily. Diagnosis Diabetes ICD-10 E11.8   GLUCOSE BLOOD (ONE TOUCH ULTRA TEST) TEST STRIP    Apply 1 strip topically 4 (four) times daily.   INSULIN LISPRO (HUMALOG) 100 UNIT/ML INJECTION    Inject 3-20 Units into the skin 3 (three) times daily before meals. Sliding scale per carb count 4 units for every 30 carbs   INSULIN PEN NEEDLE (BD PEN NEEDLE  NANO U/F) 32G X 4 MM MISC    1 pen by Does not apply route 3 (three) times daily.  Modified Medications   Modified Medication Previous Medication   ATORVASTATIN (LIPITOR) 40 MG TABLET atorvastatin (LIPITOR) 40 MG tablet      Take 1 tablet (40 mg total) by mouth daily at 6 PM.    Take 40 mg by mouth daily.   INSULIN GLARGINE (LANTUS) 100 UNIT/ML INJECTION insulin glargine (LANTUS) 100 UNIT/ML injection      Inject 0.52 mLs (52 Units total) into the skin at bedtime.    Inject 52 Units into the skin at bedtime.   Discontinued Medications   DOXYCYCLINE (VIBRAMYCIN) 50 MG CAPSULE    Take 2 capsules (100 mg total) by mouth 2 (two) times daily.   LISINOPRIL (PRINIVIL,ZESTRIL) 10 MG TABLET    Take 10 mg by mouth  daily.   PANTOPRAZOLE (PROTONIX) 40 MG TABLET    Take 2 tablets (80 mg total) by mouth daily.    Return in about 4 weeks (around 09/22/2015) for Fasting blood work, then Med OV with me 1-2 wks later. .  The patient was counseled, risk factors were discussed, anticipatory guidance given.  Gross side effects, risk and benefits, and alternatives of medications discussed with patient.  Patient is aware that all medications have potential side effects and we are unable to predict every side effect or drug-drug interaction that may occur.  Expresses verbal understanding and consents to current therapy plan and treatment regimen.  Please see AVS handed out to patient at the end of our visit for further patient instructions/ counseling done pertaining to today's office visit.    Note: This document was prepared using Dragon voice recognition software and may include unintentional dictation errors.  ----------------------------------------------------------------------------------------------------------------------    Subjective:    Chief Complaint  Patient presents with  . Establish Care    HPI: Michael Hutchinson is a pleasant 40 y.o. extremely noncompliant male with type 1 diabetes who presents to [facility] today to review their medical history with me and establish care.  - Patient is married to Michael Hutchinson and has 2 children-one 57 years Hutchinson and 43 years Hutchinson. 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   Prior PCP-  Michael Michael Hutchinson  Had Endo Doc- [facility] guy and pt didn't like him- Michael Hutchinson.    Michael Michael Hutchinson- [facility]- B/L foot pain char[place] foot.   DM-     long-standing history of type 1 diabetes-patient thinks onset around 15 years Hutchinson.  His blood sugars- unknwn b/c he doesn't check.  No sx at all.   Takes lantus regularly but not his daily humalog!     Type I DM for 8yrs and has not seen Endo in several years and does not know when last A1c was.   He does not recall  when his last foot or eye exams were, but states they're both up-to-date - done within the past year. He tells me he will get me these medical records.   - Review of his chart shows that his endocrinologist was Michael. Warden Fillers  Last OV on 08/04/2014 -  Review of chart shows his A1c was 9 in September 2016.   HTN:  Patient was on lisinopril in distant past but claims it may have caused him to cough so he stopped it.   He was lost to follow-up.  He never went back to his physician to discuss  this side effect or get on another medicine.  Poor diet- 6 or more sodas per day.  No exercise because he has "weak ankles ".    Morbidly obese- His BMI is over 43, and he has steadily gained weight since adolescence.  He estimates that he has gained 100 lbs since he was in his mid-20s.  He eats out frequently, including a lot of fast food meals.  He also does not exercise at all.     HLD:  Patient has been off his cholesterol meds for several weeks now.  Patient with poor diet and no exercise. On 9\16\16:  LDL-130, TG-122, HDL 44,  06/26/14:  LDL-144, TG 185, HDL 37   Noncompliance:   Patient appears to be unmotivated to make positive changes in his life or even take his medicines as prescribed. He denies barriers to treatment and PHQ 2 negative.     Wt Readings from Last 3 Encounters:  08/25/15 (!) 324 lb (147 kg)  06/30/12 299 lb 3.2 oz (135.7 kg)  12/10/11 275 lb (124.7 kg)   BP Readings from Last 3 Encounters:  08/25/15 132/82  12/04/13 138/81  06/30/12 122/85   Pulse Readings from Last 3 Encounters:  08/25/15 86  12/04/13 72  06/30/12 89     Patient Active Problem List   Diagnosis Date Noted  . Diabetes mellitus type 1 ([place])   . Charcot's joint of foot 11/25/2013  . Diabetic ketoacidosis ([place])   . GERD (gastroesophageal reflux disease)   . Meniscus tear   . Epigastric pain 12/10/2011  . Vomiting 12/10/2011     Past Medical History:  Diagnosis Date  . Charcot's joint of foot  11/25/2013  . Diabetes mellitus type 1 ([place])    since age 23  . Diabetic ketoacidosis ([place])   . GERD (gastroesophageal reflux disease)   . Hx MRSA infection    Inner thigh and under arm- healed areas  . Meniscus tear   . Shortness of breath    with exertion only     Past Surgical History:  Procedure Laterality Date  . KNEE ARTHROSCOPY     left  . OPEN REDUCTION INTERNAL FIXATION (ORIF) PROXIMAL PHALANX Left 06/30/2012   Procedure: OPEN REDUCTION INTERNAL FIXATION (ORIF) LEFT INDEX FINGER PROXIMAL PHALANX FRACTURE WITH LIGAMENT REPAIR AS NECESSARY;  Surgeon: [name], MD;  Location: [facility];  Service: Orthopedics;  Laterality: Left;     Family History  Problem Relation Age of Onset  . Other Mother   . Cancer Mother     Breast / Bone  . Heart attack Father   . Hypertension Father   . Hyperlipidemia Father   . Diabetes    . Alcohol abuse Sister   . Diabetes Maternal Grandfather   . Stroke Paternal Grandmother   . Alcohol abuse Paternal Grandfather      History  Drug Use No    History  Alcohol Use  . 8.4 oz/week  . 14 Cans of beer per week    History  Smoking Status  . Never Smoker  Smokeless Tobacco  . Former Systems developer  . Types: Snuff, Chew  . Quit date: 08/25/2010    Patient's Medications  New Prescriptions   LOSARTAN (COZAAR) 50 MG TABLET    Take 1 tablet (50 mg total) by mouth daily.  Previous Medications   BLOOD GLUCOSE MONITORING SUPPL (ONE TOUCH ULTRA SYSTEM KIT) W/DEVICE KIT    1 kit by Does not apply route once. Check blood sugar 3  times daily. Diagnosis Diabetes ICD-10 E11.8   GLUCOSE BLOOD (ONE TOUCH ULTRA TEST) TEST STRIP    Apply 1 strip topically 4 (four) times daily.   INSULIN LISPRO (HUMALOG) 100 UNIT/ML INJECTION    Inject 3-20 Units into the skin 3 (three) times daily before meals. Sliding scale per carb count 4 units for every 30 carbs   INSULIN PEN NEEDLE (BD PEN NEEDLE NANO U/F) 32G X 4 MM MISC    1 pen by Does not apply route 3 (three)  times daily.  Modified Medications   Modified Medication Previous Medication   ATORVASTATIN (LIPITOR) 40 MG TABLET atorvastatin (LIPITOR) 40 MG tablet      Take 1 tablet (40 mg total) by mouth daily at 6 PM.    Take 40 mg by mouth daily.   INSULIN GLARGINE (LANTUS) 100 UNIT/ML INJECTION insulin glargine (LANTUS) 100 UNIT/ML injection      Inject 0.52 mLs (52 Units total) into the skin at bedtime.    Inject 52 Units into the skin at bedtime.   Discontinued Medications   DOXYCYCLINE (VIBRAMYCIN) 50 MG CAPSULE    Take 2 capsules (100 mg total) by mouth 2 (two) times daily.   LISINOPRIL (PRINIVIL,ZESTRIL) 10 MG TABLET    Take 10 mg by mouth daily.   PANTOPRAZOLE (PROTONIX) 40 MG TABLET    Take 2 tablets (80 mg total) by mouth daily.    Allergies: Influenza vac split quad  Review of Systems:   ( Completed via Adult Medical History Intake form today ) General:  Denies fever, chills, appetite changes, unexplained weight loss.  Optho/Auditory:   Denies visual changes, blurred vision/LOV, ringing in ears/ diff hearing Respiratory:   Denies SOB, DOE, cough, wheezing.  Cardiovascular:   Denies chest pain, palpitations, new onset peripheral edema  Gastrointestinal:   Denies nausea, vomiting, diarrhea.  Genitourinary:    Denies dysuria, increased frequency, flank pain.  Endocrine:     Denies hot or cold intolerance, polyuria, polydipsia. Musculoskeletal:  Denies unexplained myalgias, joint swelling, arthralgias, gait problems.  Skin:  Denies rash, suspicious lesions or new/ changes in moles Neurological:    Denies dizziness, syncope, unexplained weakness, lightheadedness, numbness  Psychiatric/Behavioral:   Denies mood changes, suicidal or homicidal ideations, hallucinations    Objective:   Blood pressure (!) 141/86, pulse (!) 112, height 6' (1.829 m), weight (!) 324 lb (147 kg), SpO2 97 %. Body mass index is 43.94 kg/m.     Repeat vitals by me in office prior to pt leaving:    132/82 and  86-P General: Well Developed, well nourished, and in no acute distress.  Neuro: Alert and oriented x3, extra-ocular muscles intact, sensation grossly intact.  HEENT: Normocephalic, atraumatic, pupils equal round reactive to light, neck supple, no gross masses, no carotid bruits, no JVD apprec Skin: no gross suspicious lesions or rashes  Cardiac: Regular rate and rhythm, no murmurs rubs or gallops.  Respiratory: Essentially clear to auscultation bilaterally. Not using accessory muscles, speaking in full sentences.  Abdominal: Soft, not grossly distended Musculoskeletal: Ambulates w/o diff, FROM * 4 ext.  Vasc: less 2 sec cap RF, warm and pink  Psych:  No HI/SI, judgement and insight good.

## 2015-08-28 DIAGNOSIS — M14672 Charcot's joint, left ankle and foot: Secondary | ICD-10-CM | POA: Diagnosis not present

## 2015-08-28 DIAGNOSIS — M25572 Pain in left ankle and joints of left foot: Secondary | ICD-10-CM | POA: Diagnosis not present

## 2015-08-28 DIAGNOSIS — E1042 Type 1 diabetes mellitus with diabetic polyneuropathy: Secondary | ICD-10-CM | POA: Diagnosis not present

## 2015-09-04 DIAGNOSIS — M14672 Charcot's joint, left ankle and foot: Secondary | ICD-10-CM | POA: Diagnosis not present

## 2015-09-08 ENCOUNTER — Other Ambulatory Visit: Payer: Self-pay

## 2015-09-08 DIAGNOSIS — E7800 Pure hypercholesterolemia, unspecified: Secondary | ICD-10-CM

## 2015-09-08 DIAGNOSIS — E1040 Type 1 diabetes mellitus with diabetic neuropathy, unspecified: Secondary | ICD-10-CM

## 2015-09-08 DIAGNOSIS — I10 Essential (primary) hypertension: Secondary | ICD-10-CM

## 2015-09-08 DIAGNOSIS — E1010 Type 1 diabetes mellitus with ketoacidosis without coma: Secondary | ICD-10-CM

## 2015-09-08 DIAGNOSIS — Z789 Other specified health status: Secondary | ICD-10-CM

## 2015-09-09 ENCOUNTER — Other Ambulatory Visit: Payer: BLUE CROSS/BLUE SHIELD

## 2015-09-11 DIAGNOSIS — M14672 Charcot's joint, left ankle and foot: Secondary | ICD-10-CM | POA: Diagnosis not present

## 2015-09-15 ENCOUNTER — Other Ambulatory Visit (INDEPENDENT_AMBULATORY_CARE_PROVIDER_SITE_OTHER): Payer: BLUE CROSS/BLUE SHIELD

## 2015-09-15 DIAGNOSIS — I10 Essential (primary) hypertension: Secondary | ICD-10-CM | POA: Diagnosis not present

## 2015-09-15 DIAGNOSIS — Z789 Other specified health status: Secondary | ICD-10-CM | POA: Diagnosis not present

## 2015-09-15 DIAGNOSIS — E1040 Type 1 diabetes mellitus with diabetic neuropathy, unspecified: Secondary | ICD-10-CM | POA: Diagnosis not present

## 2015-09-15 DIAGNOSIS — E7800 Pure hypercholesterolemia, unspecified: Secondary | ICD-10-CM

## 2015-09-15 DIAGNOSIS — E1010 Type 1 diabetes mellitus with ketoacidosis without coma: Secondary | ICD-10-CM | POA: Diagnosis not present

## 2015-09-15 LAB — CBC WITH DIFFERENTIAL/PLATELET
BASOS PCT: 0 %
Basophils Absolute: 0 cells/uL (ref 0–200)
EOS PCT: 3 %
Eosinophils Absolute: 144 cells/uL (ref 15–500)
HEMATOCRIT: 40.4 % (ref 38.5–50.0)
Hemoglobin: 13.2 g/dL (ref 13.2–17.1)
LYMPHS PCT: 28 %
Lymphs Abs: 1344 cells/uL (ref 850–3900)
MCH: 28.9 pg (ref 27.0–33.0)
MCHC: 32.7 g/dL (ref 32.0–36.0)
MCV: 88.6 fL (ref 80.0–100.0)
MONO ABS: 672 {cells}/uL (ref 200–950)
MONOS PCT: 14 %
MPV: 10.6 fL (ref 7.5–12.5)
NEUTROS PCT: 55 %
Neutro Abs: 2640 cells/uL (ref 1500–7800)
Platelets: 382 10*3/uL (ref 140–400)
RBC: 4.56 MIL/uL (ref 4.20–5.80)
RDW: 13.4 % (ref 11.0–15.0)
WBC: 4.8 10*3/uL (ref 3.8–10.8)

## 2015-09-16 DIAGNOSIS — M14672 Charcot's joint, left ankle and foot: Secondary | ICD-10-CM | POA: Diagnosis not present

## 2015-09-16 LAB — LIPID PANEL
CHOL/HDL RATIO: 3.9 ratio (ref <=5.0)
Cholesterol: 132 mg/dL (ref 125–200)
HDL: 34 mg/dL — ABNORMAL LOW (ref >=40)
LDL Cholesterol: 76 mg/dL (ref <130)
Triglycerides: 108 mg/dL (ref <150)
VLDL: 22 mg/dL (ref <30)

## 2015-09-16 LAB — COMPREHENSIVE METABOLIC PANEL
ALK PHOS: 104 U/L (ref 40–115)
ALT: 10 U/L (ref 9–46)
AST: 11 U/L (ref 10–40)
Albumin: 3.9 g/dL (ref 3.6–5.1)
BILIRUBIN TOTAL: 0.3 mg/dL (ref 0.2–1.2)
BUN: 13 mg/dL (ref 7–25)
CALCIUM: 9.4 mg/dL (ref 8.6–10.3)
CO2: 29 mmol/L (ref 20–31)
Chloride: 97 mmol/L — ABNORMAL LOW (ref 98–110)
Creat: 0.85 mg/dL (ref 0.60–1.35)
GLUCOSE: 118 mg/dL — AB (ref 65–99)
Potassium: 5.7 mmol/L — ABNORMAL HIGH (ref 3.5–5.3)
Sodium: 134 mmol/L — ABNORMAL LOW (ref 135–146)
Total Protein: 7 g/dL (ref 6.1–8.1)

## 2015-09-16 LAB — HEMOGLOBIN A1C
HEMOGLOBIN A1C: 10.8 % — AB (ref <5.7)
Mean Plasma Glucose: 263 mg/dL

## 2015-09-16 LAB — VITAMIN D 25 HYDROXY (VIT D DEFICIENCY, FRACTURES): VIT D 25 HYDROXY: 15 ng/mL — AB (ref 30–100)

## 2015-09-16 LAB — TSH: TSH: 3.59 m[IU]/L (ref 0.40–4.50)

## 2015-09-18 DIAGNOSIS — M14672 Charcot's joint, left ankle and foot: Secondary | ICD-10-CM | POA: Diagnosis not present

## 2015-09-21 ENCOUNTER — Ambulatory Visit (INDEPENDENT_AMBULATORY_CARE_PROVIDER_SITE_OTHER): Payer: BLUE CROSS/BLUE SHIELD | Admitting: Family Medicine

## 2015-09-21 ENCOUNTER — Encounter: Payer: Self-pay | Admitting: Family Medicine

## 2015-09-21 VITALS — BP 145/94 | HR 109

## 2015-09-21 DIAGNOSIS — E108 Type 1 diabetes mellitus with unspecified complications: Secondary | ICD-10-CM

## 2015-09-21 DIAGNOSIS — I10 Essential (primary) hypertension: Secondary | ICD-10-CM

## 2015-09-21 DIAGNOSIS — E875 Hyperkalemia: Secondary | ICD-10-CM

## 2015-09-21 DIAGNOSIS — Z91199 Patient's noncompliance with other medical treatment and regimen due to unspecified reason: Secondary | ICD-10-CM

## 2015-09-21 DIAGNOSIS — E786 Lipoprotein deficiency: Secondary | ICD-10-CM

## 2015-09-21 DIAGNOSIS — Z9119 Patient's noncompliance with other medical treatment and regimen: Secondary | ICD-10-CM

## 2015-09-21 DIAGNOSIS — E559 Vitamin D deficiency, unspecified: Secondary | ICD-10-CM

## 2015-09-21 DIAGNOSIS — M14672 Charcot's joint, left ankle and foot: Secondary | ICD-10-CM | POA: Diagnosis not present

## 2015-09-21 DIAGNOSIS — IMO0002 Reserved for concepts with insufficient information to code with codable children: Secondary | ICD-10-CM

## 2015-09-21 DIAGNOSIS — E7800 Pure hypercholesterolemia, unspecified: Secondary | ICD-10-CM

## 2015-09-21 DIAGNOSIS — E1065 Type 1 diabetes mellitus with hyperglycemia: Secondary | ICD-10-CM

## 2015-09-21 MED ORDER — VITAMIN D (ERGOCALCIFEROL) 1.25 MG (50000 UNIT) PO CAPS: 50000.0000 [IU] | ORAL_CAPSULE | ORAL | 10 refills | Status: DC

## 2015-09-21 MED ORDER — INSULIN GLARGINE 100 UNIT/ML ~~LOC~~ SOLN: SUBCUTANEOUS | 1 refills | Status: DC

## 2015-09-21 MED ORDER — VITAMIN D3 125 MCG (5000 UT) PO TABS: ORAL_TABLET | ORAL | 10 refills | Status: DC

## 2015-09-21 MED ORDER — LOSARTAN POTASSIUM 50 MG PO TABS: 100.0000 mg | ORAL_TABLET | Freq: Every day | ORAL | 1 refills | Status: DC

## 2015-09-21 NOTE — Progress Notes (Signed)
Assessment and plan:  1. Uncontrolled type 1 diabetes mellitus with complication ([place])   2. Low HDL (under 40)   3. Morbid obesity due to excess calories ([place])   4. H/O noncompliance with medical treatment, presenting hazards to health   5. Hypercholesterolemia   6. Vitamin D deficiency   7. Essential hypertension   8. Serum potassium elevated    1) DM- poorly controlled. Not at goal.  patient was referred to a endocrinologist at last visit.  He has upcoming initial consult in  Early October.    - He declines diabetic educator referral today and nutrition referral- says he knows what to eat more to do but is unmotivated to do so.   - Detrimental effects of poorly controlled diabetes discussed   2) move more   3)   Use lose it or MFP apps on phone to track all foods/ drink.   Wt loss d/c pt and wife.    - 2  10 minute sessions of walking per day. This is your goal once you get your boot off.    4) Please adhere to meds / therapeutic plan/ lifestyle  Modification/   Dietary changes   5)  Continue Lipitor;  Still not exactly at goal but patient will work on diet and exercise AND add Fish Oil 3-4 gram/day.   He will buy this over-the-counter.    6)  I gave you a weekly vitamin D dose which he will pick up at the pharmacy and take indefinitely as well as you need to pick up a 5000 international units vitamin D3 over-the-counter tablet that you will take daily.   7/8) poorly controlled BP and not at goal.   Also, elevated potassium likely due to starting losartan.   -   Dietary potassium restriction -   add hydrochlorothiazide 25 mg daily along with his existing losartan 50 mg daily ( in past patient was just on lisinopril but had bad cough-so stopped it) -    obtain BMET in 3-4 wks   Reminded patient to bring in your BP and your blood sugar log next time and please don't forget your appointment with the  endocrinologist 10/12/2015  [name], [name]  New Prescriptions   CHOLECALCIFEROL (VITAMIN D3) 5000 UNITS TABS    5,000 IU OTC vitamin D3 daily.   HYDROCHLOROTHIAZIDE (HYDRODIURIL) 25 MG TABLET    Take 1 tablet (25 mg total) by mouth daily.   VITAMIN D, ERGOCALCIFEROL, (DRISDOL) 50000 UNITS CAPS CAPSULE    Take 1 capsule (50,000 Units total) by mouth every 7 (seven) days.  Previous Medications   ATORVASTATIN (LIPITOR) 40 MG TABLET    Take 1 tablet (40 mg total) by mouth daily at 6 PM.   BLOOD GLUCOSE MONITORING SUPPL (ONE TOUCH ULTRA SYSTEM KIT) W/DEVICE KIT    1 kit by Does not apply route once. Check blood sugar 3 times daily. Diagnosis Diabetes ICD-10 E11.8   GLUCOSE BLOOD (ONE TOUCH ULTRA TEST) TEST STRIP    Apply 1 strip topically 4 (four) times daily.   INSULIN LISPRO (HUMALOG) 100 UNIT/ML INJECTION    Inject 3-20 Units into the skin 3 (three) times daily before meals. Sliding scale per carb count 4 units for every 30 carbs   INSULIN PEN NEEDLE (BD PEN NEEDLE NANO U/F)  32G X 4 MM MISC    1 pen by Does not apply route 3 (three) times daily.  Modified Medications   Modified Medication Previous Medication   INSULIN GLARGINE (LANTUS) 100 UNIT/ML INJECTION insulin glargine (LANTUS) 100 UNIT/ML injection      60 units q hs.    Inject 0.52 mLs (52 Units total) into the skin at bedtime.   LOSARTAN (COZAAR) 50 MG TABLET losartan (COZAAR) 50 MG tablet      Take 1 tablet (50 mg total) by mouth daily.    Take 1 tablet (50 mg total) by mouth daily.  Discontinued Medications   No medications on file    Return in about 3 months (around 12/21/2015) for 41mo routine f/up , but 1 mo- repeat BMP lab only; sooner f/up if BP not at goal <130/80.  Anticipatory guidance and routine counseling done re: condition, txmnt options and need for follow up.  All questions of patient's were answered.   Gross side effects, risk and benefits, and alternatives of medications discussed with patient.  Patient is aware that all medications have potential side effects and we are unable to predict every sideeffect or drug-drug interaction that may occur.  Expresses verbal understanding and consents to current therapy plan and treatment regiment.  Please see AVS handed out to patient at the end of our visit for additional patient instructions/ counseling done pertaining to today's office visit.  Note: This document was prepared using Dragon voice recognition software and may include unintentional dictation errors.   ----------------------------------------------------------------------------------------------------------------------  Subjective:   CC: Michael TEUTSCHis a 40y.o. male [facility] recent fasting blood work,  for recheck of blood pressures/  Review home log and to review blood sugar logs.   [facility]\ 2\ 17 for management of his diabetes.    DM:  FBS-  He checks it a couple times per week.  Runs about 160-190.   Not checking  2 hr PP.    He did not bring in his log of BS or BP!    No complaints.   Eats twice daily usually- ends up using SS---> 12-30 units per time on ave.  52 lantus q hs.  Long history of noncompliance:   Review of chart - shows that patient has long history of noncompliance and apathy towards his medical health.  HTN:    Did not bring his blood pressure log. No complaints of chest pain, shortness of breath, dizziness, visual changes, peripheraledema,orthopnea or paroxysmalnocturnal dspnea.   alcoholic: Drinking a lot less beer now- used to drink 10-12/d, now one per day. Feels much better.   Not as Blah, not as fatigued.   Charcot foot-> L and is in hard cast now- changes weekly.  Unable to walk/  exercise   Morbidly obese-- referred to bariatric medicine (Emogene Morgan POttis Stain NP) in February 2017. Patient did not wish to undergo surgery and was supposed to work on moving more and eating better (tracking foods with my fitnes pal)   as well as [place]   He was lost to follow-up with the bariatric surgeon.     Wt Readings from Last 3 Encounters:  08/25/15 (!) 324 lb (147 kg)  06/30/12 299 lb 3.2 oz (135.7 kg)  12/10/11 275 lb (124.7 kg)   BP  Readings from Last 3 Encounters:  09/21/15 (!) 145/94  08/25/15 132/82  12/04/13 138/81   Pulse Readings from Last 3 Encounters:  09/21/15 (!) 109  08/25/15 86  12/04/13 72      Full medical history updated and reviewed in the office today  Patient Active Problem List   Diagnosis Date Noted  . Morbidly obese ([place]) 09/22/2015    Priority: High  . Diabetes mellitus type 1 ([facility])     Priority: High  . Essential hypertension 05/03/2013    Priority: High  . Hypercholesterolemia 07/26/2012    Priority: High  . Low HDL (under 40) 09/22/2015    Priority: Medium  . GERD (gastroesophageal reflux disease)     Priority: Medium  . Hx MRSA infection 09/22/2015    Priority: Low  . H/O noncompliance with medical treatment, presenting hazards to health 09/22/2015  . Vitamin D deficiency 09/22/2015  . Charcot's joint of foot 11/25/2013  . Diabetic ketoacidosis ([place])   . Meniscus tear   . Epigastric pain 12/10/2011  . Vomiting 12/10/2011  . Problems influencing health status 11/21/2011    Past Medical History:  Diagnosis Date  . Charcot's joint of foot 11/25/2013  . Diabetes mellitus type 1 ([place])    since age 25  . Diabetic ketoacidosis ([place])   . Essential hypertension 05/03/2013  . GERD (gastroesophageal reflux disease)   . Hx MRSA infection    Inner thigh and under arm- healed areas  . Meniscus tear   . Shortness of breath    with exertion only     Past Surgical History:  Procedure Laterality Date  . KNEE ARTHROSCOPY     left  . OPEN REDUCTION INTERNAL FIXATION (ORIF) PROXIMAL PHALANX Left 06/30/2012   Procedure: OPEN REDUCTION INTERNAL FIXATION (ORIF) LEFT INDEX FINGER PROXIMAL PHALANX FRACTURE WITH LIGAMENT REPAIR AS NECESSARY;  Surgeon: [name], MD;  Location: [facility];  Service: Orthopedics;  Laterality: Left;    Social History  Substance Use Topics  . Smoking status: Never Smoker  . Smokeless tobacco: Former Systems developer    Types: Snuff, Chew    Quit date: 08/25/2010  . Alcohol use 8.4 oz/week    14 Cans of beer per week    family history includes Alcohol abuse in his paternal grandfather and sister; Cancer in his mother; Diabetes in his maternal grandfather; Heart attack in his father; Hyperlipidemia in his father; Hypertension in his father; Other in his mother; Stroke in his paternal grandmother.   Medications: Current Outpatient Prescriptions  Medication Sig Dispense Refill  . atorvastatin (LIPITOR) 40 MG tablet Take 1 tablet (40 mg total) by mouth daily at 6 PM. 90 tablet 1  . Blood Glucose Monitoring Suppl (ONE TOUCH ULTRA SYSTEM KIT) w/Device KIT 1 kit by Does not apply route once. Check blood sugar 3 times daily. Diagnosis Diabetes ICD-10 E11.8    . glucose blood (ONE TOUCH ULTRA TEST) test strip Apply 1 strip topically 4 (four) times daily.    . insulin glargine (LANTUS) 100 UNIT/ML injection 60 units q hs. 500 mL 1  . insulin lispro (HUMALOG) 100 UNIT/ML injection Inject 3-20 Units into the skin 3 (three) times daily before meals. Sliding scale per carb count 4 units for every 30 carbs    . Insulin Pen Needle (BD PEN NEEDLE NANO U/F) 32G X 4 MM MISC 1 pen by Does not apply route 3 (three) times daily.    . Cholecalciferol (VITAMIN D3) 5000 units TABS 5,000 IU OTC vitamin D3 daily.  90 tablet 10  . hydrochlorothiazide (HYDRODIURIL) 25 MG tablet Take 1 tablet (25 mg total) by mouth daily. 90 tablet 1  . losartan  (COZAAR) 50 MG tablet Take 1 tablet (50 mg total) by mouth daily. 90 tablet 1  . Vitamin D, Ergocalciferol, (DRISDOL) 50000 units CAPS capsule Take 1 capsule (50,000 Units total) by mouth every 7 (seven) days. 12 capsule 10   No current facility-administered medications for this visit.     Allergies:  Allergies  Allergen Reactions  . Influenza Vac Split Quad Nausea And Vomiting     ROS:  Const:    no fevers, chills Eyes:    conjunctiva clear, no vision changes or blurred vision ENT:  no hearing difficulties, no dysphagia, no dysphonia, no nose bleeds CV:   no chest pain, arrhythmias, no orthopnea, no PND Pulm:   no SOB at rest or exertion, no Wheeze, no DIB, no hemoptysis GI:    no N/V/D/C, no abd pain GU:   no blood in urine or inc freq or urgency Heme/Onc:    no unexplained bleeding, no night sweats, no more fatigue than usual Neuro:   No dizziness, no LOC, No unexplained weakness or numbness Endo:   no unexplained wt loss or gain M-Sk:   no localized myalgias or arthralgias Psych:    No SI/HI, no memory prob or unexplained confusion    Objective:  Blood pressure (!) 145/94, pulse (!) 109.  CMA said she could not weigh patient due to his foot cast.-  BMI could not be calculated.  Gen:   Well NAD, A and O *3 HEENT:    [place]/AT, EOMI,  MMM Lungs:   Normal work of breathing. ECTA B/L-  Distant due to body habitus, no Wh, rhonchi Heart:   Distant due to body habitus but Ess RRR, S1, S2 WNL's, no MRG Abd:   Soft. gross distention/  Obese Exts:    Brisk capillary refill, warm and well perfused Psych:    No HI/SI, judgement and insight good, Euthymic mood. Full Affect.   Recent Results (from the past 2160 hour(s))  CBC w/Diff     Status: None   Collection Time: 09/15/15  8:03 AM  Result Value Ref Range   WBC 4.8 3.8 - 10.8 K/uL   RBC 4.56 4.20 - 5.80 MIL/uL   Hemoglobin 13.2 13.2 - 17.1 g/dL   HCT 40.4 38.5 - 50.0 %   MCV 88.6 80.0 - 100.0 fL   MCH 28.9 27.0 - 33.0 pg    MCHC 32.7 32.0 - 36.0 g/dL   RDW 13.4 11.0 - 15.0 %   Platelets 382 140 - 400 K/uL   MPV 10.6 7.5 - 12.5 fL   Neutro Abs 2,640 1,500 - 7,800 cells/uL   Lymphs Abs 1,344 850 - 3,900 cells/uL   Monocytes Absolute 672 200 - 950 cells/uL   Eosinophils Absolute 144 15 - 500 cells/uL   Basophils Absolute 0 0 - 200 cells/uL   Neutrophils Relative % 55 %   Lymphocytes Relative 28 %   Monocytes Relative 14 %   Eosinophils Relative 3 %   Basophils Relative 0 %   Smear Review Criteria for review not met   Comp Met (CMET)     Status: Abnormal   Collection Time: 09/15/15  8:03 AM  Result Value Ref Range   Sodium 134 (L) 135 - 146 mmol/L   Potassium 5.7 (H) 3.5 - 5.3 mmol/L    Comment: No visible hemolysis.   Chloride  97 (L) 98 - 110 mmol/L   CO2 29 20 - 31 mmol/L   Glucose, Bld 118 (H) 65 - 99 mg/dL   BUN 13 7 - 25 mg/dL   Creat 0.85 0.60 - 1.35 mg/dL   Total Bilirubin 0.3 0.2 - 1.2 mg/dL   Alkaline Phosphatase 104 40 - 115 U/L   AST 11 10 - 40 U/L   ALT 10 9 - 46 U/L   Total Protein 7.0 6.1 - 8.1 g/dL   Albumin 3.9 3.6 - 5.1 g/dL   Calcium 9.4 8.6 - 10.3 mg/dL  Lipid panel     Status: Abnormal   Collection Time: 09/15/15  8:03 AM  Result Value Ref Range   Cholesterol 132 125 - 200 mg/dL   Triglycerides 108 <150 mg/dL   HDL 34 (L) >=40 mg/dL   Total CHOL/HDL Ratio 3.9 <=5.0 Ratio   VLDL 22 <30 mg/dL   LDL Cholesterol 76 <130 mg/dL    Comment:   Total Cholesterol/HDL Ratio:CHD Risk                        Coronary Heart Disease Risk Table                                        Men       Women          1/2 Average Risk              3.4        3.3              Average Risk              5.0        4.4           2X Average Risk              9.6        7.1           3X Average Risk             23.4       11.0 Use the calculated Patient Ratio above and the CHD Risk table  to determine the patient's CHD Risk.   TSH     Status: None   Collection Time: 09/15/15  8:03 AM  Result Value  Ref Range   TSH 3.59 0.40 - 4.50 mIU/L  Hemoglobin A1c     Status: Abnormal   Collection Time: 09/15/15  8:03 AM  Result Value Ref Range   Hgb A1c MFr Bld 10.8 (H) <5.7 %    Comment:   For someone without known diabetes, a hemoglobin A1c value of 6.5% or greater indicates that they may have diabetes and this should be confirmed with a follow-up test.   For someone with known diabetes, a value <7% indicates that their diabetes is well controlled and a value greater than or equal to 7% indicates suboptimal control. A1c targets should be individualized based on duration of diabetes, age, comorbid conditions, and other considerations.   Currently, no consensus exists for use of hemoglobin A1c for diagnosis of diabetes for children.      Mean Plasma Glucose 263 mg/dL  VITAMIN D 25 Hydroxy (Vit-D Deficiency, Fractures)     Status: Abnormal   Collection Time: 09/15/15  8:03 AM  Result Value Ref  Range   Vit D, 25-Hydroxy 15 (L) 30 - 100 ng/mL    Comment: Vitamin D Status           25-OH Vitamin D        Deficiency                <20 ng/mL        Insufficiency         20 - 29 ng/mL        Optimal             > or = 30 ng/mL   For 25-OH Vitamin D testing on patients on D2-supplementation and patients for whom quantitation of D2 and D3 fractions is required, the QuestAssureD 25-OH VIT D, (D2,D3), LC/MS/MS is recommended: order code [id number] (patients > 2 yrs).

## 2015-09-21 NOTE — Patient Instructions (Addendum)
I gave you a weekly vitamin D dose which he will pick up at the pharmacy and take indefinitely as well as you need to pick up a 5000 international units vitamin D3 over-the-counter tablet that you will take daily.   To 10 minute sessions of walking per day. This is your goal once you get your boot off. Please do it!  Breathing and your blood sugar log next time and please don't forget your appointment with the endocrinologist 10/12/2015     Diabetes and Exercise Exercising regularly is important. It is not just about losing weight. It has many health benefits, such as:  Improving your overall fitness, flexibility, and endurance.  Increasing your bone density.  Helping with weight control.  Decreasing your body fat.  Increasing your muscle strength.  Reducing stress and tension.  Improving your overall health. People with diabetes who exercise gain additional benefits because exercise:  Reduces appetite.  Improves the body's use of blood sugar (glucose).  Helps lower or control blood glucose.  Decreases blood pressure.  Helps control blood lipids (such as cholesterol and triglycerides).  Improves the body's use of the hormone insulin by:  Increasing the body's insulin sensitivity.  Reducing the body's insulin needs.  Decreases the risk for heart disease because exercising:  Lowers cholesterol and triglycerides levels.  Increases the levels of good cholesterol (such as high-density lipoproteins [HDL]) in the body.  Lowers blood glucose levels. YOUR ACTIVITY PLAN  Choose an activity that you enjoy, and set realistic goals. To exercise safely, you should begin practicing any new physical activity slowly, and gradually increase the intensity of the exercise over time. Your health care provider or diabetes educator can help create an activity plan that works for you. General recommendations include:  Encouraging children to engage in at least 60 minutes of physical  activity each day.  Stretching and performing strength training exercises, such as yoga or weight lifting, at least 2 times per week.  Performing a total of at least 150 minutes of moderate-intensity exercise each week, such as brisk walking or water aerobics.  Exercising at least 3 days per week, making sure you allow no more than 2 consecutive days to pass without exercising.  Avoiding long periods of inactivity (90 minutes or more). When you have to spend an extended period of time sitting down, take frequent breaks to walk or stretch. RECOMMENDATIONS FOR EXERCISING WITH TYPE 1 OR TYPE 2 DIABETES   Check your blood glucose before exercising. If blood glucose levels are greater than 240 mg/dL, check for urine ketones. Do not exercise if ketones are present.  Avoid injecting insulin into areas of the body that are going to be exercised. For example, avoid injecting insulin into:  The arms when playing tennis.  The legs when jogging.  Keep a record of:  Food intake before and after you exercise.  Expected peak times of insulin action.  Blood glucose levels before and after you exercise.  The type and amount of exercise you have done.  Review your records with your health care provider. Your health care provider will help you to develop guidelines for adjusting food intake and insulin amounts before and after exercising.  If you take insulin or oral hypoglycemic agents, watch for signs and symptoms of hypoglycemia. They include:  Dizziness.  Shaking.  Sweating.  Chills.  Confusion.  Drink plenty of water while you exercise to prevent dehydration or heat stroke. Body water is lost during exercise and must be replaced.  Talk to your health care provider before starting an exercise program to make sure it is safe for you. Remember, almost any type of activity is better than none.   This information is not intended to replace advice given to you by your health care provider.  Make sure you discuss any questions you have with your health care provider.   Document Released: 03/19/2003 Document Revised: 05/13/2014 Document Reviewed: 06/05/2012 Elsevier Interactive Patient Education 2016 [employer] Inc[employer]   Basic Carbohydrate Counting for Diabetes Mellitus Carbohydrate counting is a method for keeping track of the amount of carbohydrates you eat. Eating carbohydrates naturally increases the level of sugar (glucose) in your blood, so it is important for you to know the amount that is okay for you to have in every meal. Carbohydrate counting helps keep the level of glucose in your blood within normal limits. The amount of carbohydrates allowed is different for every person. A dietitian can help you calculate the amount that is right for you. Once you know the amount of carbohydrates you can have, you can count the carbohydrates in the foods you want to eat. Carbohydrates are found in the following foods:  Grains, such as breads and cereals.  Dried beans and soy products.  Starchy vegetables, such as potatoes, peas, and corn.  Fruit and fruit juices.  Milk and yogurt.  Sweets and snack foods, such as cake, cookies, candy, chips, soft drinks, and fruit drinks. CARBOHYDRATE COUNTING There are two ways to count the carbohydrates in your food. You can use either of the methods or a combination of both. Reading the "Nutrition Facts" on Packaged Food The "Nutrition Facts" is an area that is included on the labels of almost all packaged food and beverages in the [place]. It includes the serving size of that food or beverage and information about the nutrients in each serving of the food, including the grams (g) of carbohydrate per serving.  Decide the number of servings of this food or beverage that you will be able to eat or drink. Multiply that number of servings by the number of grams of carbohydrate that is listed on the label for that serving. The total will be the  amount of carbohydrates you will be having when you eat or drink this food or beverage. Learning Standard Serving Sizes of Food When you eat food that is not packaged or does not include "Nutrition Facts" on the label, you need to measure the servings in order to count the amount of carbohydrates.A serving of most carbohydrate-rich foods contains about 15 g of carbohydrates. The following list includes serving sizes of carbohydrate-rich foods that provide 15 g ofcarbohydrate per serving:   1 slice of bread (1 oz) or 1 six-inch tortilla.    of a hamburger bun or English muffin.  4-6 crackers.   cup unsweetened dry cereal.    cup hot cereal.   cup rice or pasta.    cup mashed potatoes or  of a large baked potato.  1 cup fresh fruit or one small piece of fruit.    cup canned or frozen fruit or fruit juice.  1 cup milk.   cup plain fat-free yogurt or yogurt sweetened with artificial sweeteners.   cup cooked dried beans or starchy vegetable, such as peas, corn, or potatoes.  Decide the number of standard-size servings that you will eat. Multiply that number of servings by 15 (the grams of carbohydrates in that serving). For example, if you eat 2 cups of  strawberries, you will have eaten 2 servings and 30 g of carbohydrates (2 servings x 15 g = 30 g). For foods such as soups and casseroles, in which more than one food is mixed in, you will need to count the carbohydrates in each food that is included. EXAMPLE OF CARBOHYDRATE COUNTING Sample Dinner  3 oz chicken breast.   cup of brown rice.   cup of corn.  1 cup milk.   1 cup strawberries with sugar-free whipped topping.  Carbohydrate Calculation Step 1: Identify the foods that contain carbohydrates:   Rice.   Corn.   Milk.   Strawberries. Step 2:Calculate the number of servings eaten of each:   2 servings of rice.   1 serving of corn.   1 serving of milk.   1 serving of  strawberries. Step 3: Multiply each of those number of servings by 15 g:   2 servings of rice x 15 g = 30 g.   1 serving of corn x 15 g = 15 g.   1 serving of milk x 15 g = 15 g.   1 serving of strawberries x 15 g = 15 g. Step 4: Add together all of the amounts to find the total grams of carbohydrates eaten: 30 g + 15 g + 15 g + 15 g = 75 g.   This information is not intended to replace advice given to you by your health care provider. Make sure you discuss any questions you have with your health care provider.   Document Released: 12/27/2004 Document Revised: 01/17/2014 Document Reviewed: 11/23/2012 Elsevier Interactive Patient Education [employer]

## 2015-09-22 DIAGNOSIS — Z9119 Patient's noncompliance with other medical treatment and regimen: Secondary | ICD-10-CM | POA: Insufficient documentation

## 2015-09-22 DIAGNOSIS — E559 Vitamin D deficiency, unspecified: Secondary | ICD-10-CM | POA: Insufficient documentation

## 2015-09-22 DIAGNOSIS — E786 Lipoprotein deficiency: Secondary | ICD-10-CM | POA: Insufficient documentation

## 2015-09-22 DIAGNOSIS — Z91199 Patient's noncompliance with other medical treatment and regimen due to unspecified reason: Secondary | ICD-10-CM | POA: Insufficient documentation

## 2015-09-22 DIAGNOSIS — Z8614 Personal history of Methicillin resistant Staphylococcus aureus infection: Secondary | ICD-10-CM | POA: Insufficient documentation

## 2015-09-22 DIAGNOSIS — E66813 Obesity, class 3: Secondary | ICD-10-CM | POA: Insufficient documentation

## 2015-09-22 MED ORDER — LOSARTAN POTASSIUM 50 MG PO TABS: 50.0000 mg | ORAL_TABLET | Freq: Every day | ORAL | 1 refills | Status: DC

## 2015-09-22 MED ORDER — HYDROCHLOROTHIAZIDE 25 MG PO TABS: 25.0000 mg | ORAL_TABLET | Freq: Every day | ORAL | 1 refills | Status: DC

## 2015-09-25 DIAGNOSIS — M14672 Charcot's joint, left ankle and foot: Secondary | ICD-10-CM | POA: Diagnosis not present

## 2015-09-28 DIAGNOSIS — M14672 Charcot's joint, left ankle and foot: Secondary | ICD-10-CM | POA: Diagnosis not present

## 2015-09-28 DIAGNOSIS — E1042 Type 1 diabetes mellitus with diabetic polyneuropathy: Secondary | ICD-10-CM | POA: Diagnosis not present

## 2015-10-02 DIAGNOSIS — M14672 Charcot's joint, left ankle and foot: Secondary | ICD-10-CM | POA: Diagnosis not present

## 2015-10-09 DIAGNOSIS — E11621 Type 2 diabetes mellitus with foot ulcer: Secondary | ICD-10-CM | POA: Diagnosis not present

## 2015-10-09 DIAGNOSIS — M14672 Charcot's joint, left ankle and foot: Secondary | ICD-10-CM | POA: Diagnosis not present

## 2015-10-12 ENCOUNTER — Other Ambulatory Visit: Payer: Self-pay

## 2015-10-12 ENCOUNTER — Ambulatory Visit (INDEPENDENT_AMBULATORY_CARE_PROVIDER_SITE_OTHER): Payer: BLUE CROSS/BLUE SHIELD | Admitting: Internal Medicine

## 2015-10-12 ENCOUNTER — Encounter: Payer: Self-pay | Admitting: Internal Medicine

## 2015-10-12 VITALS — BP 140/90 | HR 102 | Ht 70.0 in | Wt 315.0 lb

## 2015-10-12 DIAGNOSIS — M14672 Charcot's joint, left ankle and foot: Secondary | ICD-10-CM | POA: Diagnosis not present

## 2015-10-12 DIAGNOSIS — E1065 Type 1 diabetes mellitus with hyperglycemia: Secondary | ICD-10-CM | POA: Diagnosis not present

## 2015-10-12 DIAGNOSIS — IMO0002 Reserved for concepts with insufficient information to code with codable children: Secondary | ICD-10-CM

## 2015-10-12 DIAGNOSIS — E10610 Type 1 diabetes mellitus with diabetic neuropathic arthropathy: Secondary | ICD-10-CM | POA: Diagnosis not present

## 2015-10-12 MED ORDER — GLUCAGON (RDNA) 1 MG IJ KIT: 1.0000 mg | PACK | Freq: Once | INTRAMUSCULAR | 11 refills | Status: DC | PRN

## 2015-10-12 MED ORDER — GLUCOSE BLOOD VI STRP: ORAL_STRIP | 12 refills | Status: DC

## 2015-10-12 MED ORDER — INSULIN GLARGINE 100 UNIT/ML ~~LOC~~ SOLN: SUBCUTANEOUS | 1 refills | Status: DC

## 2015-10-12 MED ORDER — BAYER MICROLET LANCETS MISC: 12 refills | Status: DC

## 2015-10-12 NOTE — Progress Notes (Signed)
Patient ID: Michael Hutchinson, male   DOB: April 14, 1975, 40 y.o.   MRN: [id number]   HPI: Michael Hutchinson is a 40 y.o.-year-old male, referred by his PCP, Dr. Raliegh Scarlet for management of DM1, uncontrolled, with complications (Bilateral Charcot foot, foot ulcers). He is here with his wife who offers part of the history.  Patient has been diagnosed with diabetes at 40 y/o he started on insulin at dx. He has not been on a pump.  Last hemoglobin A1c was: Lab Results  Component Value Date   HGBA1C 10.8 (H) 09/15/2015   HGBA1C (H) 05/23/2010    11.9  Earlier 2017: 9.0%  Now on: - Lantus 50 units at bedtime  - forgets 1-2 x a mo - Humalog 0-1x a day with meals or SSI: ISF: 25, ? [employer] - usually takes it if >180   Meter: One Touch   Pt checks his sugars 1-3x a day and they are: - am: 120-300 - 2h after b'fast: n/c - before lunch: 120-180s - 2h after lunch: n/c  - before dinner: 200s - 2h after dinner: n/c - bedtime: n/c - nighttime: 30s (!)  - if increased Lantus to 60 or if skips dinner + lows. Lowest sugar was 30s; he has hypoglycemia awareness at 70. No previous hypoglycemia admission. Does not have a glucagon kit at home. Highest sugar was HI - q 6 mo. + previous DKA admissions - 3-4 years ago..    Pt's meals are: - Breakfast: bisquit (sausage + egg) - Lunch: skips - Dinner: grilled or backed chicken, pork chops, shrimp + green beans or corn, mac and cheese - Snacks: diet pepsi  - no CKD, last BUN/creatinine:  Lab Results  Component Value Date   BUN 13 09/15/2015   BUN 12 06/30/2012   CREATININE 0.85 09/15/2015   CREATININE 0.99 05/11/2015  On Losartan. - last set of lipids: Lab Results  Component Value Date   CHOL 132 09/15/2015   HDL 34 (L) 09/15/2015   LDLCALC 76 09/15/2015   TRIG 108 09/15/2015   CHOLHDL 3.9 09/15/2015  On Lipitor. - last eye exam was in 08/2015. No DR. + macular edema. Got injections. - no numbness and tingling in his feet. + Charcot foot.  Pt also  has a h/o HTN, HL.  Last TSH: Lab Results  Component Value Date   TSH 3.59 09/15/2015   Pt has FH of DM in MGF (type 1).  ROS: Constitutional: + weight loss, + fatigue, + subjective hypothermia, + nocturia Eyes: no blurry vision, no xerophthalmia ENT: no sore throat, no nodules palpated in throat, no dysphagia/odynophagia, no hoarseness, + tinnitus Cardiovascular: no CP/SOB/palpitations/leg swelling Respiratory: no cough/SOB Gastrointestinal: no N/V/D/C/+ heartburn Musculoskeletal: no muscle/+ joint aches Skin: no rashes Neurological: no tremors/numbness/tingling/dizziness Psychiatric: no depression/anxiety + low libido  Past Medical History:  Diagnosis Date  . Charcot's joint of foot 11/25/2013  . Diabetes mellitus type 1 ([place])    since age 40  . Diabetic ketoacidosis ([place])   . Essential hypertension 05/03/2013  . GERD (gastroesophageal reflux disease)   . Hx MRSA infection    Inner thigh and under arm- healed areas  . Meniscus tear   . Shortness of breath    with exertion only   Past Surgical History:  Procedure Laterality Date  . KNEE ARTHROSCOPY     left  . OPEN REDUCTION INTERNAL FIXATION (ORIF) PROXIMAL PHALANX Left 06/30/2012   Procedure: OPEN REDUCTION INTERNAL FIXATION (ORIF) LEFT INDEX FINGER PROXIMAL PHALANX FRACTURE WITH  LIGAMENT REPAIR AS NECESSARY;  Surgeon: [name], MD;  Location: [facility];  Service: Orthopedics;  Laterality: Left;   Social History   Social History  . Marital status: married    Spouse name: N/A  . Number of children: 2   Occupational History  . technician   Social History Main Topics  . Smoking status: Never Smoker  . Smokeless tobacco: Former Systems developer    Types: Snuff, Chew    Quit date: 08/25/2010  . Alcohol use occas.    beer  . Drug use: No   Current Outpatient Prescriptions on File Prior to Visit  Medication Sig Dispense Refill  . atorvastatin (LIPITOR) 40 MG tablet Take 1 tablet (40 mg total) by mouth daily at 6 PM. 90  tablet 1  . Blood Glucose Monitoring Suppl (ONE TOUCH ULTRA SYSTEM KIT) w/Device KIT 1 kit by Does not apply route once. Check blood sugar 3 times daily. Diagnosis Diabetes ICD-10 E11.8    . hydrochlorothiazide (HYDRODIURIL) 25 MG tablet Take 1 tablet (25 mg total) by mouth daily. 90 tablet 1  . insulin lispro (HUMALOG) 100 UNIT/ML injection Inject 3-20 Units into the skin 3 (three) times daily before meals. Sliding scale per carb count 4 units for every 30 carbs    . Insulin Pen Needle (BD PEN NEEDLE NANO U/F) 32G X 4 MM MISC 1 pen by Does not apply route 3 (three) times daily.    Marland Kitchen losartan (COZAAR) 50 MG tablet Take 1 tablet (50 mg total) by mouth daily. 90 tablet 1  . Vitamin D, Ergocalciferol, (DRISDOL) 50000 units CAPS capsule Take 1 capsule (50,000 Units total) by mouth every 7 (seven) days. 12 capsule 10  . Cholecalciferol (VITAMIN D3) 5000 units TABS 5,000 IU OTC vitamin D3 daily. (Patient not taking: Reported on 10/12/2015) 90 tablet 10   No current facility-administered medications on file prior to visit.    Allergies  Allergen Reactions  . Influenza Vac Split Quad Nausea And Vomiting   Family History  Problem Relation Age of Onset  . Other Mother   . Cancer Mother     Breast / Bone  . Heart attack Father   . Hypertension Father   . Hyperlipidemia Father   . Diabetes    . Alcohol abuse Sister   . Diabetes Maternal Grandfather   . Stroke Paternal Grandmother   . Alcohol abuse Paternal Grandfather    PE: BP 140/90 (BP Location: Left Arm, Patient Position: Sitting)   Pulse (!) 102   Ht _0  (1.778 m)   Wt (!) 315 lb (142.9 kg)   SpO2 98%   BMI 45.20 kg/m  Wt Readings from Last 3 Encounters:  10/12/15 (!) 315 lb (142.9 kg)  08/25/15 (!) 324 lb (147 kg)  06/30/12 299 lb 3.2 oz (135.7 kg)   Constitutional: Obese, in NAD Eyes: PERRLA, EOMI, no exophthalmos ENT: moist mucous membranes, no thyromegaly, no cervical lymphadenopathy Cardiovascular: RRR, No MRG, Swelling  L Lower leg - pitting, right lower leg in cast - could not evaluate Respiratory: CTA B Gastrointestinal: abdomen soft, NT, ND, BS+ Musculoskeletal: no deformities, strength intact in all 4; L foot in cast Skin: moist, warm, no rashes Neurological: no tremor with outstretched hands, DTR normal in all 4  ASSESSMENT: 1. DM1, uncontrolled, with complications - Bilateral Charcot foot - foot ulcers - after placing cast  PLAN:  1. Patient with long-standing, uncontrolled DM1, on  basal-bolus insulin therapy. He is not taking his Humalog, and relies only  on Lantus to control his sugars. Subsequently, as expected, he has high sugars during the day with a precipitous drop overnight. He has had lows at night, but these are mostly if he skips dinner or injects more than 50 units of Lantus. - we discussed that he cannot get his DM under control if he does not take Humalog before meals >> he agrees to start. We'll give him mealtime insulin doses depending on the size of his meal and also sliding scale. I will make the opposite change in his Lantus, decreasing the dose to 40 units. - he is interested in a pump, but I advised him that I need to document compliance with visits, his insulin regimen, and sugar checks before going this route. At next visit, he will need a referral to diabetes education for prepump discussion and nutrition for a carb counting. He is not comfortable with carb counting now. - We discussed about changes to his insulin regimen, as follows:  Patient Instructions  Please decrease the Lantus dose to 40 units at bedtime.  Please add Humalog: - 10 units before a smaller meal - 14 units before a larger meal  Add the following Sliding Scale: - 150-175: + 1 unit  - 176-200: + 2 units  - 201-225: + 3 units  - 226-250: + 4 units  - 251-275: + 5 units - 276-300: + 6 units  Please return in 1.5 months with your sugar log.   - Strongly advised him to start checking sugars at different  times of the day - check at least 3 times a day, rotating checks - given sugar log and advised how to fill it and to bring it at next appt  - We gave him a Bayer contour meter and sent strips and lancets to his pharmacy - given foot care handout and explained the principles  - given instructions for hypoglycemia management "15-15 rule"  - advised for yearly eye exams - sent glucagon kit Rx to pharmacy >> Explained how to use it  - advised to get ketone strips - advised to always have Glu tablets with him - advised for a Med-alert bracelet mentioning "type 1 diabetes mellitus". - given instruction Re: exercising and driving in DM1 (pt instructions) - no signs of other autoimmune disorders - Return to clinic in 1.5 mo with sugar log   Philemon Kingdom, MD PhD [facility]

## 2015-10-12 NOTE — Patient Instructions (Signed)
Please decrease the Lantus dose to 40 units at bedtime.  Please add Humalog: - 10 units before a smaller meal - 14 units before a larger meal  Add the following Sliding Scale: - 150-175: + 1 unit  - 176-200: + 2 units  - 201-225: + 3 units  - 226-250: + 4 units  - 251-275: + 5 units - 276-300: + 6 units  Please return in 1.5 months with your sugar log.   Basic Rules for Patients with Type I Diabetes Mellitus  1. The American Diabetes Association (ADA) recommended targets: - fasting sugar <130 - after meal sugar <180 - HbA1C <7%  2. Engage in ?150 min moderate exercise per week  3. Make sure you have ?8h of sleep every night as this helps both blood sugars and your weight.  4. Always keep a sugar log (not only record in your meter) and bring it to all appointments with us.  5. "15-15 rule" for hypoglycemia: if sugars are low, take 15 g of carbs** ("fast sugar" - e.g. 4 glucose tablets, 4 oz orange juice), wait 15 min, then check sugars again. If still <80, repeat. Continue  until your sugars >80, then eat a normal meal.   6. Teach family members and coworkers to inject glucagon. Have a glucagon set at home and one at work. They should call 911 after using the set.  7. Check sugar before driving. If <100, correct, and only start driving if sugars rise ?[id number]. Check sugar every hour when on a long drive.  8. Check sugar before exercising. If <100, correct, and only start exercising if sugars rise ?100. Check sugar every hour when on a long exercise routine and 1h after you finished exercising.   If >250, check urine for ketones. If you have moderate-large ketones in urine, do not start exercise. Hydrate yourself with clear liquids and correct the high sugar. Recheck sugars and ketones before attempting to exercise.  Be aware that you might need less insulin when exercising.  *intense, short, exercise bursts can increase your sugars, but  *less intense, longer (>1h), exercise  routines can decrease your sugars.   9. Make sure you have a MedAlert bracelet or pendant mentioning "Type I Diabetes Mellitus". If you have a prior episode of severe hypoglycemia or hypoglycemia unawareness, it should also mention this.  10. Please do not walk barefoot. Inspect your feet for sores/cuts and let us know if you have them.  11. Please call [facility] with any questions and concerns [phone]).   **E.g. of "fast carbs": ? first choice (15 g):  1 tube glucose gel, GlucoPouch 15, 2 oz glucose liquid ? second choice (15-16 g):  3 or 4 glucose tablets (best taken  with water), 15 Dextrose Bits chewable ? third choice (15-20 g):   cup fruit juice,  cup regular soda, 1 cup skim milk,  1 cup sports drink ? fourth choice (15-20 g):  1 small tube [place] gel (not frosting), 2 tbsp raisins, 1 tbsp table sugar,  candy, jelly beans, gum drops - check package for carb amount   (adapted from: Juluis RainierMcCall A.L. "Insulin therapy and hypoglycemia" Endocrinol Metab Clin N Am 2012, 41: 57-87)

## 2015-10-16 DIAGNOSIS — M1460 Charcot's joint, unspecified site: Secondary | ICD-10-CM | POA: Diagnosis not present

## 2015-10-16 DIAGNOSIS — M79672 Pain in left foot: Secondary | ICD-10-CM | POA: Diagnosis not present

## 2015-10-16 DIAGNOSIS — M14672 Charcot's joint, left ankle and foot: Secondary | ICD-10-CM | POA: Diagnosis not present

## 2015-10-19 DIAGNOSIS — L97421 Non-pressure chronic ulcer of left heel and midfoot limited to breakdown of skin: Secondary | ICD-10-CM | POA: Diagnosis not present

## 2015-10-19 DIAGNOSIS — E11621 Type 2 diabetes mellitus with foot ulcer: Secondary | ICD-10-CM | POA: Diagnosis not present

## 2015-10-19 DIAGNOSIS — M14672 Charcot's joint, left ankle and foot: Secondary | ICD-10-CM | POA: Diagnosis not present

## 2015-10-22 DIAGNOSIS — M14672 Charcot's joint, left ankle and foot: Secondary | ICD-10-CM | POA: Diagnosis not present

## 2015-10-26 DIAGNOSIS — M14672 Charcot's joint, left ankle and foot: Secondary | ICD-10-CM | POA: Diagnosis not present

## 2015-11-02 DIAGNOSIS — M14672 Charcot's joint, left ankle and foot: Secondary | ICD-10-CM | POA: Diagnosis not present

## 2015-11-02 DIAGNOSIS — E11621 Type 2 diabetes mellitus with foot ulcer: Secondary | ICD-10-CM | POA: Diagnosis not present

## 2015-11-13 DIAGNOSIS — E11621 Type 2 diabetes mellitus with foot ulcer: Secondary | ICD-10-CM | POA: Diagnosis not present

## 2015-11-13 DIAGNOSIS — M14672 Charcot's joint, left ankle and foot: Secondary | ICD-10-CM | POA: Diagnosis not present

## 2015-11-13 DIAGNOSIS — L97421 Non-pressure chronic ulcer of left heel and midfoot limited to breakdown of skin: Secondary | ICD-10-CM | POA: Diagnosis not present

## 2015-11-20 DIAGNOSIS — M14672 Charcot's joint, left ankle and foot: Secondary | ICD-10-CM | POA: Diagnosis not present

## 2015-11-24 ENCOUNTER — Ambulatory Visit: Payer: BLUE CROSS/BLUE SHIELD | Admitting: Internal Medicine

## 2015-11-27 DIAGNOSIS — E11621 Type 2 diabetes mellitus with foot ulcer: Secondary | ICD-10-CM | POA: Diagnosis not present

## 2015-11-27 DIAGNOSIS — M14672 Charcot's joint, left ankle and foot: Secondary | ICD-10-CM | POA: Diagnosis not present

## 2015-11-27 DIAGNOSIS — L97421 Non-pressure chronic ulcer of left heel and midfoot limited to breakdown of skin: Secondary | ICD-10-CM | POA: Diagnosis not present

## 2015-12-25 ENCOUNTER — Other Ambulatory Visit: Payer: Self-pay | Admitting: Family Medicine

## 2015-12-28 ENCOUNTER — Other Ambulatory Visit: Payer: Self-pay

## 2015-12-28 MED ORDER — INSULIN GLARGINE 100 UNIT/ML ~~LOC~~ SOLN
SUBCUTANEOUS | 1 refills | Status: DC
Start: 2015-12-28 — End: 2017-02-01

## 2015-12-30 DIAGNOSIS — E11622 Type 2 diabetes mellitus with other skin ulcer: Secondary | ICD-10-CM | POA: Diagnosis not present

## 2015-12-30 DIAGNOSIS — L97324 Non-pressure chronic ulcer of left ankle with necrosis of bone: Secondary | ICD-10-CM | POA: Diagnosis not present

## 2015-12-30 DIAGNOSIS — M14672 Charcot's joint, left ankle and foot: Secondary | ICD-10-CM | POA: Diagnosis not present

## 2015-12-31 ENCOUNTER — Other Ambulatory Visit: Payer: Self-pay

## 2015-12-31 ENCOUNTER — Encounter (HOSPITAL_COMMUNITY): Payer: Self-pay

## 2015-12-31 ENCOUNTER — Inpatient Hospital Stay (HOSPITAL_COMMUNITY)
Admission: EM | Admit: 2015-12-31 | Discharge: 2016-01-03 | DRG: 475 | Disposition: A | Discharge status: Home / Self Care | Payer: BLUE CROSS/BLUE SHIELD | Attending: Internal Medicine | Admitting: Internal Medicine

## 2015-12-31 ENCOUNTER — Emergency Department (HOSPITAL_COMMUNITY): Payer: BLUE CROSS/BLUE SHIELD

## 2015-12-31 ENCOUNTER — Encounter (HOSPITAL_BASED_OUTPATIENT_CLINIC_OR_DEPARTMENT_OTHER): Payer: BLUE CROSS/BLUE SHIELD

## 2015-12-31 ENCOUNTER — Inpatient Hospital Stay
Admission: RE | Admit: 2015-12-31 | Payer: BLUE CROSS/BLUE SHIELD | Source: Ambulatory Visit | Admitting: Orthopedic Surgery

## 2015-12-31 DIAGNOSIS — E11621 Type 2 diabetes mellitus with foot ulcer: Secondary | ICD-10-CM | POA: Diagnosis not present

## 2015-12-31 DIAGNOSIS — D638 Anemia in other chronic diseases classified elsewhere: Secondary | ICD-10-CM | POA: Diagnosis present

## 2015-12-31 DIAGNOSIS — E10621 Type 1 diabetes mellitus with foot ulcer: Secondary | ICD-10-CM

## 2015-12-31 DIAGNOSIS — E559 Vitamin D deficiency, unspecified: Secondary | ICD-10-CM | POA: Diagnosis present

## 2015-12-31 DIAGNOSIS — E1069 Type 1 diabetes mellitus with other specified complication: Secondary | ICD-10-CM | POA: Diagnosis not present

## 2015-12-31 DIAGNOSIS — E1065 Type 1 diabetes mellitus with hyperglycemia: Secondary | ICD-10-CM | POA: Diagnosis present

## 2015-12-31 DIAGNOSIS — Z811 Family history of alcohol abuse and dependence: Secondary | ICD-10-CM | POA: Diagnosis not present

## 2015-12-31 DIAGNOSIS — I70262 Atherosclerosis of native arteries of extremities with gangrene, left leg: Secondary | ICD-10-CM | POA: Diagnosis not present

## 2015-12-31 DIAGNOSIS — Z823 Family history of stroke: Secondary | ICD-10-CM | POA: Diagnosis not present

## 2015-12-31 DIAGNOSIS — Z8614 Personal history of Methicillin resistant Staphylococcus aureus infection: Secondary | ICD-10-CM | POA: Diagnosis not present

## 2015-12-31 DIAGNOSIS — E10610 Type 1 diabetes mellitus with diabetic neuropathic arthropathy: Secondary | ICD-10-CM | POA: Diagnosis present

## 2015-12-31 DIAGNOSIS — Z794 Long term (current) use of insulin: Secondary | ICD-10-CM

## 2015-12-31 DIAGNOSIS — E785 Hyperlipidemia, unspecified: Secondary | ICD-10-CM | POA: Diagnosis present

## 2015-12-31 DIAGNOSIS — M79605 Pain in left leg: Secondary | ICD-10-CM | POA: Diagnosis not present

## 2015-12-31 DIAGNOSIS — K219 Gastro-esophageal reflux disease without esophagitis: Secondary | ICD-10-CM | POA: Diagnosis present

## 2015-12-31 DIAGNOSIS — Z89512 Acquired absence of left leg below knee: Secondary | ICD-10-CM | POA: Diagnosis not present

## 2015-12-31 DIAGNOSIS — E786 Lipoprotein deficiency: Secondary | ICD-10-CM | POA: Diagnosis present

## 2015-12-31 DIAGNOSIS — Z01818 Encounter for other preprocedural examination: Secondary | ICD-10-CM | POA: Diagnosis not present

## 2015-12-31 DIAGNOSIS — E11622 Type 2 diabetes mellitus with other skin ulcer: Secondary | ICD-10-CM | POA: Diagnosis not present

## 2015-12-31 DIAGNOSIS — Z87891 Personal history of nicotine dependence: Secondary | ICD-10-CM | POA: Diagnosis not present

## 2015-12-31 DIAGNOSIS — Z8349 Family history of other endocrine, nutritional and metabolic diseases: Secondary | ICD-10-CM

## 2015-12-31 DIAGNOSIS — Z79891 Long term (current) use of opiate analgesic: Secondary | ICD-10-CM

## 2015-12-31 DIAGNOSIS — M869 Osteomyelitis, unspecified: Secondary | ICD-10-CM | POA: Insufficient documentation

## 2015-12-31 DIAGNOSIS — R000 Tachycardia, unspecified: Secondary | ICD-10-CM

## 2015-12-31 DIAGNOSIS — Z833 Family history of diabetes mellitus: Secondary | ICD-10-CM

## 2015-12-31 DIAGNOSIS — Z6841 Body Mass Index (BMI) 40.0 and over, adult: Secondary | ICD-10-CM | POA: Diagnosis not present

## 2015-12-31 DIAGNOSIS — L97529 Non-pressure chronic ulcer of other part of left foot with unspecified severity: Secondary | ICD-10-CM | POA: Diagnosis not present

## 2015-12-31 DIAGNOSIS — Z803 Family history of malignant neoplasm of breast: Secondary | ICD-10-CM | POA: Diagnosis not present

## 2015-12-31 DIAGNOSIS — G8918 Other acute postprocedural pain: Secondary | ICD-10-CM | POA: Diagnosis not present

## 2015-12-31 DIAGNOSIS — I10 Essential (primary) hypertension: Secondary | ICD-10-CM | POA: Diagnosis present

## 2015-12-31 DIAGNOSIS — Z887 Allergy status to serum and vaccine status: Secondary | ICD-10-CM

## 2015-12-31 DIAGNOSIS — E1159 Type 2 diabetes mellitus with other circulatory complications: Secondary | ICD-10-CM | POA: Diagnosis present

## 2015-12-31 DIAGNOSIS — M86472 Chronic osteomyelitis with draining sinus, left ankle and foot: Principal | ICD-10-CM | POA: Diagnosis present

## 2015-12-31 DIAGNOSIS — S91009A Unspecified open wound, unspecified ankle, initial encounter: Secondary | ICD-10-CM | POA: Diagnosis present

## 2015-12-31 DIAGNOSIS — Z8249 Family history of ischemic heart disease and other diseases of the circulatory system: Secondary | ICD-10-CM

## 2015-12-31 DIAGNOSIS — E7800 Pure hypercholesterolemia, unspecified: Secondary | ICD-10-CM | POA: Diagnosis not present

## 2015-12-31 DIAGNOSIS — Z808 Family history of malignant neoplasm of other organs or systems: Secondary | ICD-10-CM

## 2015-12-31 DIAGNOSIS — L97324 Non-pressure chronic ulcer of left ankle with necrosis of bone: Secondary | ICD-10-CM | POA: Diagnosis not present

## 2015-12-31 DIAGNOSIS — L97424 Non-pressure chronic ulcer of left heel and midfoot with necrosis of bone: Secondary | ICD-10-CM | POA: Diagnosis present

## 2015-12-31 DIAGNOSIS — R2689 Other abnormalities of gait and mobility: Secondary | ICD-10-CM

## 2015-12-31 DIAGNOSIS — E109 Type 1 diabetes mellitus without complications: Secondary | ICD-10-CM

## 2015-12-31 DIAGNOSIS — M14672 Charcot's joint, left ankle and foot: Secondary | ICD-10-CM | POA: Diagnosis not present

## 2015-12-31 DIAGNOSIS — D649 Anemia, unspecified: Secondary | ICD-10-CM

## 2015-12-31 DIAGNOSIS — M86672 Other chronic osteomyelitis, left ankle and foot: Secondary | ICD-10-CM | POA: Diagnosis not present

## 2015-12-31 DIAGNOSIS — I152 Hypertension secondary to endocrine disorders: Secondary | ICD-10-CM | POA: Diagnosis present

## 2015-12-31 DIAGNOSIS — R262 Difficulty in walking, not elsewhere classified: Secondary | ICD-10-CM

## 2015-12-31 HISTORY — DX: Pure hypercholesterolemia, unspecified: E78.00

## 2015-12-31 HISTORY — DX: Other complications of anesthesia, initial encounter: T88.59XA

## 2015-12-31 HISTORY — DX: Adverse effect of unspecified anesthetic, initial encounter: T41.45XA

## 2015-12-31 LAB — COMPREHENSIVE METABOLIC PANEL
ALT: 18 U/L (ref 17–63)
AST: 20 U/L (ref 15–41)
Albumin: 2.9 g/dL — ABNORMAL LOW (ref 3.5–5.0)
Alkaline Phosphatase: 149 U/L — ABNORMAL HIGH (ref 38–126)
Anion gap: 12 (ref 5–15)
BUN: 8 mg/dL (ref 6–20)
CHLORIDE: 97 mmol/L — AB (ref 101–111)
CO2: 24 mmol/L (ref 22–32)
CREATININE: 0.99 mg/dL (ref 0.61–1.24)
Calcium: 8.8 mg/dL — ABNORMAL LOW (ref 8.9–10.3)
GFR calc Af Amer: >60 mL/min (ref >60)
GFR calc non Af Amer: >60 mL/min (ref >60)
Glucose, Bld: 192 mg/dL — ABNORMAL HIGH (ref 65–99)
Potassium: 3.7 mmol/L (ref 3.5–5.1)
SODIUM: 133 mmol/L — AB (ref 135–145)
Total Bilirubin: 0.8 mg/dL (ref 0.3–1.2)
Total Protein: 7.2 g/dL (ref 6.5–8.1)

## 2015-12-31 LAB — CBC WITH DIFFERENTIAL/PLATELET
Basophils Absolute: 0 10*3/uL (ref 0.0–0.1)
Basophils Relative: 0 %
EOS ABS: 0.1 10*3/uL (ref 0.0–0.7)
Eosinophils Relative: 1 %
HCT: 31.2 % — ABNORMAL LOW (ref 39.0–52.0)
HEMOGLOBIN: 10.1 g/dL — AB (ref 13.0–17.0)
LYMPHS ABS: 0.7 10*3/uL (ref 0.7–4.0)
Lymphocytes Relative: 10 %
MCH: 28.9 pg (ref 26.0–34.0)
MCHC: 32.4 g/dL (ref 30.0–36.0)
MCV: 89.1 fL (ref 78.0–100.0)
MONOS PCT: 8 %
Monocytes Absolute: 0.5 10*3/uL (ref 0.1–1.0)
NEUTROS PCT: 81 %
Neutro Abs: 5.2 10*3/uL (ref 1.7–7.7)
Platelets: 347 10*3/uL (ref 150–400)
RBC: 3.5 MIL/uL — ABNORMAL LOW (ref 4.22–5.81)
RDW: 13.3 % (ref 11.5–15.5)
WBC: 6.5 10*3/uL (ref 4.0–10.5)

## 2015-12-31 LAB — GLUCOSE, CAPILLARY
GLUCOSE-CAPILLARY: 177 mg/dL — AB (ref 65–99)
Glucose-Capillary: 194 mg/dL — ABNORMAL HIGH (ref 65–99)

## 2015-12-31 LAB — I-STAT CG4 LACTIC ACID, ED: Lactic Acid, Venous: 1.25 mmol/L (ref 0.5–1.9)

## 2015-12-31 LAB — I-STAT TROPONIN, ED: Troponin i, poc: 0 ng/mL (ref 0.00–0.08)

## 2015-12-31 MED ORDER — INSULIN ASPART 100 UNIT/ML ~~LOC~~ SOLN
0.0000 [IU] | Freq: Three times a day (TID) | SUBCUTANEOUS | Status: DC
  Administered 2015-12-31: 3 [IU] via SUBCUTANEOUS
  Administered 2016-01-01: 5 [IU] via SUBCUTANEOUS
  Administered 2016-01-01: 2 [IU] via SUBCUTANEOUS
  Administered 2016-01-02: 3 [IU] via SUBCUTANEOUS
  Administered 2016-01-02: 2 [IU] via SUBCUTANEOUS
  Administered 2016-01-03 (×2): 3 [IU] via SUBCUTANEOUS

## 2015-12-31 MED ORDER — SODIUM CHLORIDE 0.9 % IV SOLN
INTRAVENOUS | Status: DC
Start: 2015-12-31 — End: 2016-01-01
  Administered 2015-12-31: 16:00:00 via INTRAVENOUS

## 2015-12-31 MED ORDER — INSULIN GLARGINE 100 UNIT/ML ~~LOC~~ SOLN
20.0000 [IU] | Freq: Every day | SUBCUTANEOUS | Status: DC
  Administered 2015-12-31 – 2016-01-02 (×3): 20 [IU] via SUBCUTANEOUS
  Filled 2015-12-31 (×4): qty 0.2

## 2015-12-31 MED ORDER — VITAMIN D (ERGOCALCIFEROL) 1.25 MG (50000 UNIT) PO CAPS
50000.0000 [IU] | ORAL_CAPSULE | ORAL | Status: DC
  Administered 2016-01-01: 50000 [IU] via ORAL
  Filled 2015-12-31: qty 1

## 2015-12-31 MED ORDER — CHLORHEXIDINE GLUCONATE 4 % EX LIQD
60.0000 mL | Freq: Once | CUTANEOUS | Status: AC
  Administered 2016-01-01: 4 via TOPICAL
  Filled 2015-12-31: qty 60

## 2015-12-31 MED ORDER — ACETAMINOPHEN 650 MG RE SUPP: 650.0000 mg | Freq: Four times a day (QID) | RECTAL | Status: DC | PRN

## 2015-12-31 MED ORDER — HYDROCHLOROTHIAZIDE 25 MG PO TABS
25.0000 mg | ORAL_TABLET | Freq: Every day | ORAL | Status: DC
  Administered 2016-01-01 – 2016-01-03 (×3): 25 mg via ORAL
  Filled 2015-12-31 (×3): qty 1

## 2015-12-31 MED ORDER — HEPARIN SODIUM (PORCINE) 5000 UNIT/ML IJ SOLN: 5000.0000 [IU] | Freq: Three times a day (TID) | INTRAMUSCULAR | Status: DC

## 2015-12-31 MED ORDER — POVIDONE-IODINE 10 % EX SWAB: 2.0000 "application " | Freq: Once | CUTANEOUS | Status: DC

## 2015-12-31 MED ORDER — LACTATED RINGERS IV SOLN
INTRAVENOUS | Status: DC
  Administered 2016-01-01 (×4): via INTRAVENOUS

## 2015-12-31 MED ORDER — SODIUM CHLORIDE 0.9 % IV BOLUS (SEPSIS)
1000.0000 mL | Freq: Once | INTRAVENOUS | Status: AC
  Administered 2015-12-31: 1000 mL via INTRAVENOUS

## 2015-12-31 MED ORDER — SODIUM CHLORIDE 0.9 % IV SOLN
INTRAVENOUS | Status: DC
  Administered 2015-12-31: 125 mL/h via INTRAVENOUS

## 2015-12-31 MED ORDER — ATORVASTATIN CALCIUM 40 MG PO TABS
40.0000 mg | ORAL_TABLET | Freq: Every day | ORAL | Status: DC
Start: 2016-01-01 — End: 2016-01-03
  Administered 2016-01-01 – 2016-01-03 (×3): 40 mg via ORAL
  Filled 2015-12-31 (×3): qty 1

## 2015-12-31 MED ORDER — ACETAMINOPHEN 325 MG PO TABS
650.0000 mg | ORAL_TABLET | Freq: Four times a day (QID) | ORAL | Status: DC | PRN
  Administered 2015-12-31: 650 mg via ORAL
  Filled 2015-12-31: qty 2

## 2015-12-31 MED ORDER — LOSARTAN POTASSIUM 50 MG PO TABS
50.0000 mg | ORAL_TABLET | Freq: Every day | ORAL | Status: DC
  Administered 2016-01-01 – 2016-01-03 (×3): 50 mg via ORAL
  Filled 2015-12-31 (×3): qty 1

## 2015-12-31 MED ORDER — DEXTROSE 5 % IV SOLN
3.0000 g | INTRAVENOUS | Status: AC
  Administered 2016-01-01: 3 g via INTRAVENOUS
  Filled 2015-12-31 (×2): qty 3000

## 2015-12-31 MED ORDER — RAMELTEON 8 MG PO TABS
8.0000 mg | ORAL_TABLET | Freq: Every day | ORAL | Status: DC
  Administered 2015-12-31 – 2016-01-02 (×3): 8 mg via ORAL
  Filled 2015-12-31 (×4): qty 1

## 2015-12-31 NOTE — ED Notes (Signed)
Pt. Ambulatory to and from restroom with steady gait.

## 2015-12-31 NOTE — ED Notes (Signed)
Surgical PA at bedside. Plan for surgery around 1600 pending blood tests.

## 2015-12-31 NOTE — Consult Note (Signed)
Reason for Consult:  Left ankle diabetic ulcer Referring Physician:  Dr. Scheryl Darter is an 40 y.o. male.  HPI: 40 y/o male with PMH of diabetes presented to my office yesterday with progression of a left lateral hindfoot ulcer.  He has a h/o Charcot neuroarthropathy bilat, and we've been treating him for acute Charcot on the left for the last several months.  He has most recently been partial WB in a CAM boot.  He was unable to tolerate total contact casting.  On Sunday he began having fever and chills and noted increased drainage from the left ankle wound.  He reports being febrile to 103 F on Tuesday.  In the office yesterday he was febrile to 100.3 and was tachycardic.  I advised him to go to the ER for evaluation for sepsis.  He reports feeling better overnight.  He denies f/c/n/v since his office visit yesterday.  He is not currently taking any abx.  Past Medical History:  Diagnosis Date  . Charcot's joint of foot 11/25/2013  . Diabetes mellitus type 1 ([place])    since age 73  . Diabetic ketoacidosis ([place])   . Essential hypertension 05/03/2013  . GERD (gastroesophageal reflux disease)   . Hx MRSA infection    Inner thigh and under arm- healed areas  . Meniscus tear   . Shortness of breath    with exertion only    Past Surgical History:  Procedure Laterality Date  . KNEE ARTHROSCOPY     left  . OPEN REDUCTION INTERNAL FIXATION (ORIF) PROXIMAL PHALANX Left 06/30/2012   Procedure: OPEN REDUCTION INTERNAL FIXATION (ORIF) LEFT INDEX FINGER PROXIMAL PHALANX FRACTURE WITH LIGAMENT REPAIR AS NECESSARY;  Surgeon: [name], MD;  Location: [facility];  Service: Orthopedics;  Laterality: Left;    Family History  Problem Relation Age of Onset  . Other Mother   . Cancer Mother     Breast / Bone  . Heart attack Father   . Hypertension Father   . Hyperlipidemia Father   . Diabetes    . Alcohol abuse Sister   . Diabetes Maternal Grandfather   . Stroke Paternal Grandmother   .  Alcohol abuse Paternal Grandfather     Social History:  reports that he has never smoked. He quit smokeless tobacco use about 5 years ago. His smokeless tobacco use included Snuff and Chew. He reports that he drinks about 8.4 oz of alcohol per week . He reports that he does not use drugs.  Allergies:  Allergies  Allergen Reactions  . Influenza Vac Split Quad Nausea And Vomiting    Medications: I have reviewed the patient's current medications.  Results for orders placed or performed during the hospital encounter of 12/31/15 (from the past 48 hour(s))  CBC with Differential/Platelet     Status: Abnormal   Collection Time: 12/31/15 10:16 AM  Result Value Ref Range   WBC 6.5 4.0 - 10.5 K/uL   RBC 3.50 (L) 4.22 - 5.81 MIL/uL   Hemoglobin 10.1 (L) 13.0 - 17.0 g/dL   HCT 31.2 (L) 39.0 - 52.0 %   MCV 89.1 78.0 - 100.0 fL   MCH 28.9 26.0 - 34.0 pg   MCHC 32.4 30.0 - 36.0 g/dL   RDW 13.3 11.5 - 15.5 %   Platelets 347 150 - 400 K/uL   Neutrophils Relative % 81 %   Neutro Abs 5.2 1.7 - 7.7 K/uL   Lymphocytes Relative 10 %   Lymphs Abs 0.7 0.7 -  4.0 K/uL   Monocytes Relative 8 %   Monocytes Absolute 0.5 0.1 - 1.0 K/uL   Eosinophils Relative 1 %   Eosinophils Absolute 0.1 0.0 - 0.7 K/uL   Basophils Relative 0 %   Basophils Absolute 0.0 0.0 - 0.1 K/uL  Comprehensive metabolic panel     Status: Abnormal   Collection Time: 12/31/15 10:16 AM  Result Value Ref Range   Sodium 133 (L) 135 - 145 mmol/L   Potassium 3.7 3.5 - 5.1 mmol/L   Chloride 97 (L) 101 - 111 mmol/L   CO2 24 22 - 32 mmol/L   Glucose, Bld 192 (H) 65 - 99 mg/dL   BUN 8 6 - 20 mg/dL   Creatinine, Ser 0.99 0.61 - 1.24 mg/dL   Calcium 8.8 (L) 8.9 - 10.3 mg/dL   Total Protein 7.2 6.5 - 8.1 g/dL   Albumin 2.9 (L) 3.5 - 5.0 g/dL   AST 20 15 - 41 U/L   ALT 18 17 - 63 U/L   Alkaline Phosphatase 149 (H) 38 - 126 U/L   Total Bilirubin 0.8 0.3 - 1.2 mg/dL   GFR calc non Af Amer >60 >60 mL/min   GFR calc Af Amer >60 >60 mL/min     Comment: (NOTE) The eGFR has been calculated using the CKD EPI equation. This calculation has not been validated in all clinical situations. eGFR's persistently <60 mL/min signify possible Chronic Kidney Disease.    Anion gap 12 5 - 15  I-stat troponin, ED     Status: None   Collection Time: 12/31/15 10:30 AM  Result Value Ref Range   Troponin i, poc 0.00 0.00 - 0.08 ng/mL   Comment 3            Comment: Due to the release kinetics of cTnI, a negative result within the first hours of the onset of symptoms does not rule out myocardial infarction with certainty. If myocardial infarction is still suspected, repeat the test at appropriate intervals.   I-Stat CG4 Lactic Acid, ED     Status: None   Collection Time: 12/31/15 10:32 AM  Result Value Ref Range   Lactic Acid, Venous 1.25 0.5 - 1.9 mmol/L  Glucose, capillary     Status: Abnormal   Collection Time: 12/31/15  4:29 PM  Result Value Ref Range   Glucose-Capillary 177 (H) 65 - 99 mg/dL    Dg Chest Port 1 View  Result Date: 12/31/2015 CLINICAL DATA:  Preoperative evaluation for foot amputation. Hypertension. EXAM: PORTABLE CHEST 1 VIEW COMPARISON:  December 10, 2011 FINDINGS: Lungs are clear. Heart size and pulmonary vascularity are normal. No adenopathy. No bone lesions. IMPRESSION: No edema or consolidation. Electronically Signed   By: Lowella Grip III M.D.   On: 12/31/2015 10:31    ROS:  No recent f/c/n/v.  Increased drainage from left ankle PE:  Blood pressure (!) 152/88, pulse (!) 105, temperature 99.1 F (37.3 C), temperature source Oral, resp. rate 17, SpO2 99 %.  Overweight male in nad.  A and O x 4.  Mood and affect normal.  EOMI.  resp unlabored.  L ankle with 7 cm ulcer with abundant fibrinous exudate and seropurulent drainage.  Erythema, warmth and swelling extend approx 3 cm circumferentially.  No lymphadenopathy.  Skin intact above the wound.  Ankle is grossly in 30 deg of varus.  Moderate swelling around  the ankle.  Sens to LT severely diminished to the mid leg.  5/5 strength at the knee.  The  left fibula is palpable at the depth of the wound with a CTA.  Assessment/Plan: L charcot foot and ankle deformities with diabetic ulcer and osteomyelitis.  I believe that limb preservation treatment of his left charcot foot and ankle deformities have failed.  At this point I believe it's medically necessary to consider below knee amputation as the only viable treatment option.  The risks and benefits of the alternative treatment options have been discussed in detail.  The patient wishes to proceed with surgery and specifically understands risks of bleeding, infection, nerve damage, blood clots, need for additional surgery, amputation and death.  NPO after midnight.  Hold blood thinners for surgery tomorrow afternoon.  [name] 12/31/2015, 4:45 PM

## 2015-12-31 NOTE — ED Notes (Signed)
Attempted report 

## 2015-12-31 NOTE — ED Provider Notes (Signed)
[facility] DEPT Provider Note   CSN: [id number] Arrival date & time: 12/31/15  0920     History   Chief Complaint Chief Complaint  Patient presents with  . Leg Pain    HPI Michael Hutchinson is a 40 y.o. male.  Patient is a 40 year old male with a history significant for diabetes, hypertension, hyperlipidemia and ongoing chronic left ankle wound presenting today at the request of Dr. Doran Durand.  Patient arrived at the office yesterday febrile with foul-smelling wound in May were concerned for sepsis. Patient did not come to the emergency room last night became this morning. The plan is for amputation later today if patient has surgically clear. However they were concerned that he may have sepsis and need to further workup before undergoing surgery. Patient has had intermittent fevers and decreased appetite but denies chest pain, shortness of breath, abdominal pain, vomiting or change in urine. He is currently not taking antibiotics.  He has no known heart history   The history is provided by the patient.  Leg Pain   This is a chronic problem.    Past Medical History:  Diagnosis Date  . Charcot's joint of foot 11/25/2013  . Diabetes mellitus type 1 ([facility])    since age 64  . Diabetic ketoacidosis ([place])   . Essential hypertension 05/03/2013  . GERD (gastroesophageal reflux disease)   . Hx MRSA infection    Inner thigh and under arm- healed areas  . Meniscus tear   . Shortness of breath    with exertion only    Patient Active Problem List   Diagnosis Date Noted  . Type 1 diabetes, uncontrolled, with Charcot's joint of foot ([place]) 10/12/2015  . Low HDL (under 40) 09/22/2015  . Morbidly obese ([place]) 09/22/2015  . H/O noncompliance with medical treatment, presenting hazards to health 09/22/2015  . Hx MRSA infection 09/22/2015  . Vitamin D deficiency 09/22/2015  . Diabetes mellitus type 1 ([place])   . Charcot's joint of foot 11/25/2013  . Essential hypertension 05/03/2013  .  Hypercholesterolemia 07/26/2012  . Diabetic ketoacidosis ([place])   . GERD (gastroesophageal reflux disease)   . Meniscus tear   . Epigastric pain 12/10/2011  . Vomiting 12/10/2011  . Problems influencing health status 11/21/2011    Past Surgical History:  Procedure Laterality Date  . KNEE ARTHROSCOPY     left  . OPEN REDUCTION INTERNAL FIXATION (ORIF) PROXIMAL PHALANX Left 06/30/2012   Procedure: OPEN REDUCTION INTERNAL FIXATION (ORIF) LEFT INDEX FINGER PROXIMAL PHALANX FRACTURE WITH LIGAMENT REPAIR AS NECESSARY;  Surgeon: [name], MD;  Location: [facility];  Service: Orthopedics;  Laterality: Left;       Home Medications    Prior to Admission medications   Medication Sig Start Date End Date Taking? Authorizing Provider  atorvastatin (LIPITOR) 40 MG tablet Take 1 tablet (40 mg total) by mouth daily at 6 PM. 08/25/15   Mellody Dance, DO  BAYER MICROLET LANCETS lancets Use as instructed to check 3 times daily. 10/12/15   Philemon Kingdom, MD  Blood Glucose Monitoring Suppl (ONE TOUCH ULTRA SYSTEM KIT) w/Device KIT 1 kit by Does not apply route once. Check blood sugar 3 times daily. Diagnosis Diabetes ICD-10 E11.8    Historical Provider, MD  Cholecalciferol (VITAMIN D3) 5000 units TABS 5,000 IU OTC vitamin D3 daily. Patient not taking: Reported on 10/12/2015 09/21/15   Mellody Dance, DO  glucagon (GLUCAGON EMERGENCY) 1 MG injection Inject 1 mg into the muscle once as needed. 10/12/15   Salena Saner  Gherghe, MD  glucose blood (BAYER CONTOUR NEXT TEST) test strip Use as instructed to check 3 times daily. 10/12/15   Philemon Kingdom, MD  hydrochlorothiazide (HYDRODIURIL) 25 MG tablet Take 1 tablet (25 mg total) by mouth daily. 09/22/15   Mellody Dance, DO  HYDROcodone-acetaminophen (NORCO/VICODIN) 5-325 MG tablet Take 1 tablet by mouth every 6 (six) hours as needed for moderate pain.    Historical Provider, MD  insulin glargine (LANTUS) 100 UNIT/ML injection Inject 40 units at bedtime 12/28/15    Philemon Kingdom, MD  insulin lispro (HUMALOG) 100 UNIT/ML injection Inject 3-20 Units into the skin 3 (three) times daily before meals. Sliding scale per carb count 4 units for every 30 carbs    Historical Provider, MD  Insulin Pen Needle (BD PEN NEEDLE NANO U/F) 32G X 4 MM MISC 1 pen by Does not apply route 3 (three) times daily. 10/23/13   Historical Provider, MD  losartan (COZAAR) 50 MG tablet Take 1 tablet (50 mg total) by mouth daily. 09/22/15   Mellody Dance, DO  Vitamin D, Ergocalciferol, (DRISDOL) 50000 units CAPS capsule Take 1 capsule (50,000 Units total) by mouth every 7 (seven) days. 09/21/15   Mellody Dance, DO    Family History Family History  Problem Relation Age of Onset  . Other Mother   . Cancer Mother     Breast / Bone  . Heart attack Father   . Hypertension Father   . Hyperlipidemia Father   . Diabetes    . Alcohol abuse Sister   . Diabetes Maternal Grandfather   . Stroke Paternal Grandmother   . Alcohol abuse Paternal Grandfather     Social History Social History  Substance Use Topics  . Smoking status: Never Smoker  . Smokeless tobacco: Former Systems developer    Types: Snuff, Chew    Quit date: 08/25/2010  . Alcohol use 8.4 oz/week    14 Cans of beer per week     Allergies   Influenza vac split quad   Review of Systems Review of Systems  All other systems reviewed and are negative.    Physical Exam Updated Vital Signs BP 160/92   Pulse (!) 123   Resp 19   SpO2 97%   Physical Exam  Constitutional: He is oriented to person, place, and time. He appears well-developed and well-nourished. No distress.  HENT:  Head: Normocephalic and atraumatic.  Mouth/Throat: Oropharynx is clear and moist.  Eyes: Conjunctivae and EOM are normal. Pupils are equal, round, and reactive to light.  Neck: Normal range of motion. Neck supple.  Cardiovascular: Regular rhythm and intact distal pulses.  Tachycardia present.   No murmur heard. Pulmonary/Chest: Effort normal  and breath sounds normal. No respiratory distress. He has no wheezes. He has no rales.  Abdominal: Soft. He exhibits no distension. There is no tenderness. There is no rebound and no guarding.  Musculoskeletal: Normal range of motion. He exhibits edema and tenderness.       Feet:  Neurological: He is alert and oriented to person, place, and time.  Skin: Skin is warm and dry. No rash noted. No erythema.  Psychiatric: He has a normal mood and affect. His behavior is normal.  Nursing note and vitals reviewed.    ED Treatments / Results  Labs (all labs ordered are listed, but only abnormal results are displayed) Labs Reviewed  CBC WITH DIFFERENTIAL/PLATELET - Abnormal; Notable for the following:       Result Value   RBC 3.50 (*)  Hemoglobin 10.1 (*)    HCT 31.2 (*)    All other components within normal limits  COMPREHENSIVE METABOLIC PANEL - Abnormal; Notable for the following:    Sodium 133 (*)    Chloride 97 (*)    Glucose, Bld 192 (*)    Calcium 8.8 (*)    Albumin 2.9 (*)    Alkaline Phosphatase 149 (*)    All other components within normal limits  I-STAT TROPOININ, ED  I-STAT CG4 LACTIC ACID, ED    EKG  EKG Interpretation  Date/Time:  Thursday December 31 2015 10:13:46 EST Ventricular Rate:  123 PR Interval:    QRS Duration: 97 QT Interval:  308 QTC Calculation: 441 R Axis:   55 Text Interpretation:  Sinus tachycardia Low voltage, precordial leads Artifact No significant change since last tracing Confirmed by Maryan Rued  MD, Loree Fee ([id number]) on 12/31/2015 10:52:28 AM       Radiology Dg Chest Port 1 View  Result Date: 12/31/2015 CLINICAL DATA:  Preoperative evaluation for foot amputation. Hypertension. EXAM: PORTABLE CHEST 1 VIEW COMPARISON:  December 10, 2011 FINDINGS: Lungs are clear. Heart size and pulmonary vascularity are normal. No adenopathy. No bone lesions. IMPRESSION: No edema or consolidation. Electronically Signed   By: Lowella Grip III M.D.   On:  12/31/2015 10:31    Procedures Procedures (including critical care time)  Medications Ordered in ED Medications  sodium chloride 0.9 % bolus 1,000 mL (1,000 mLs Intravenous New Bag/Given 12/31/15 1030)     Initial Impression / Assessment and Plan / ED Course  I have reviewed the triage vital signs and the nursing notes.  Pertinent labs & imaging results that were available during my care of the patient were reviewed by me and considered in my medical decision making (see chart for details).  Clinical Course    Patient is a 40 year old male presenting today with worsening left ankle infection. This is been an ongoing wound since June but continues to worsen despite wound care. Patient has had intermittent fevers but denies fever today. He was seen at Dr. Nona Dell office yesterday and at that time was febrile and tachycardic. They have made the decision to amputate the leg below the knee due to persistent wound and most likely osteomyelitis. Patient is currently not taking antibiotics and when discussing with orthopedics they wanted to hold on antibiotics. They were concerned that he may be septic and recommended workup here and admission by the hospitalist. They plan on taking him to the operating room at 4 PM today if he is medically clear.  Labs today white count within normal limits, lactic acid within normal limits, EKG was sinus tachycardia and chest x-ray within normal limits. CMP pending. Patient given an IV fluid bolus and made nothing by mouth. No evidence of sepsis at this time  Final Clinical Impressions(s) / ED Diagnoses   Final diagnoses:  Chronic osteomyelitis of left ankle with draining sinus (HCC)  Tachycardia    New Prescriptions New Prescriptions   No medications on file     Blanchie Dessert, MD 12/31/15 1121

## 2015-12-31 NOTE — ED Notes (Signed)
Diabetic Diet ordered for Pt. And will be delivered to Floor.

## 2015-12-31 NOTE — ED Notes (Signed)
EDP at bedside  

## 2015-12-31 NOTE — H&P (Signed)
Date: 12/31/2015               Patient Name:  Michael Hutchinson MRN: [id number]  DOB: 12-25-1975 Age / Sex: 40 y.o., male   PCP: Mellody Dance, DO         Medical Service: Internal Medicine Teaching Service         Attending Physician: Dr. Lucious Groves, DO    First Contact: Dr. Holley Raring Pager: [phone]  Second Contact: Dr. Berline Lopes Pager: [phone]       After Hours (After 5p/  First Contact Pager: [phone]  weekends / holidays): Second Contact Pager: [phone]   Chief Complaint: left ankle wound  History of Present Illness: Michael Hutchinson is a 40 y.o. male with a h/o of type 1 diabetes, hypertension, hyperlipidemia and ongoing chronic left ankle wound who presents to the ED from his orthopaedist office for consideration of surgical management of chronic left ankle wound, now thought to be infected.  Pt was seen yesterday for his routine wound care evaluation by orthopaedics. At that time he was found to be febrile and tachycardic and was sent to the ED for evaluation prior to planned L BKA for infected non-healing diabetic foot ulcer. On arrival to the ED, pt was found to be afebrile, but tachycardic in the 120s. HDS w/o leukocytosis, negative LA, saturations good on RA.  Pt reports that the wound started several months ago in March for which he was treated by [facility] and with orthotic boot. His wound continued to worsen in spite of these treatments. Pt reports that he generally has poor sensation in his bilateral LE. He denies significant pain, but reports using Norco 1-2 times daily w/ good success. Pt denies SOB, CP, fever/chills. No N/V.  Meds: Current Facility-Administered Medications  Medication Dose Route Frequency Provider Last Rate Last Dose  . 0.9 %  sodium chloride infusion   Intravenous STAT Blanchie Dessert, MD       Current Outpatient Prescriptions  Medication Sig Dispense Refill  . acetaminophen (TYLENOL) 325 MG tablet Take 650 mg by mouth  every 6 (six) hours as needed for mild pain.    Marland Kitchen atorvastatin (LIPITOR) 40 MG tablet Take 1 tablet (40 mg total) by mouth daily at 6 PM. (Patient taking differently: Take 40 mg by mouth daily. ) 90 tablet 1  . BAYER MICROLET LANCETS lancets Use as instructed to check 3 times daily. 100 each 12  . Blood Glucose Monitoring Suppl (ONE TOUCH ULTRA SYSTEM KIT) w/Device KIT 1 kit by Does not apply route once. Check blood sugar 3 times daily. Diagnosis Diabetes ICD-10 E11.8    . glucagon (GLUCAGON EMERGENCY) 1 MG injection Inject 1 mg into the muscle once as needed. 1 each 11  . glucose blood (BAYER CONTOUR NEXT TEST) test strip Use as instructed to check 3 times daily. 100 each 12  . hydrochlorothiazide (HYDRODIURIL) 25 MG tablet Take 1 tablet (25 mg total) by mouth daily. 90 tablet 1  . HYDROcodone-acetaminophen (NORCO/VICODIN) 5-325 MG tablet Take 1 tablet by mouth 2 (two) times daily.     . insulin glargine (LANTUS) 100 UNIT/ML injection Inject 40 units at bedtime 500 mL 1  . insulin lispro (HUMALOG) 100 UNIT/ML injection Inject 3-20 Units into the skin 3 (three) times daily before meals. Sliding scale per carb count 4 units for every 30 carbs    . Insulin Pen Needle (BD PEN NEEDLE NANO U/F) 32G X 4 MM  MISC 1 pen by Does not apply route 3 (three) times daily.    Marland Kitchen losartan (COZAAR) 50 MG tablet Take 1 tablet (50 mg total) by mouth daily. 90 tablet 1  . Vitamin D, Ergocalciferol, (DRISDOL) 50000 units CAPS capsule Take 1 capsule (50,000 Units total) by mouth every 7 (seven) days. 12 capsule 10  . Cholecalciferol (VITAMIN D3) 5000 units TABS 5,000 IU OTC vitamin D3 daily. (Patient not taking: Reported on 12/31/2015) 90 tablet 10    (Not in a hospital admission) Allergies: Allergies as of 12/31/2015 - Review Complete 12/31/2015  Allergen Reaction Noted  . Influenza vac split quad Nausea And Vomiting 09/26/2014   Past Medical History:  Diagnosis Date  . Charcot's joint of foot 11/25/2013  .  Diabetes mellitus type 1 ([facility])    since age 83  . Diabetic ketoacidosis ([place])   . Essential hypertension 05/03/2013  . GERD (gastroesophageal reflux disease)   . Hx MRSA infection    Inner thigh and under arm- healed areas  . Meniscus tear   . Shortness of breath    with exertion only    Family History: Family History  Problem Relation Age of Onset  . Other Mother   . Cancer Mother     Breast / Bone  . Heart attack Father   . Hypertension Father   . Hyperlipidemia Father   . Diabetes    . Alcohol abuse Sister   . Diabetes Maternal Grandfather   . Stroke Paternal Grandmother   . Alcohol abuse Paternal Grandfather    Social History: Social History  Substance Use Topics  . Smoking status: Never Smoker  . Smokeless tobacco: Former Systems developer    Types: Snuff, Chew    Quit date: 08/25/2010  . Alcohol use 8.4 oz/week    14 Cans of beer per week   Review of Systems: A complete ROS was negative except as per HPI. Review of Systems  Constitutional: Negative for chills, fever and weight loss.  Eyes: Negative for blurred vision.  Respiratory: Negative for cough and shortness of breath.   Cardiovascular: Negative for chest pain and leg swelling.  Gastrointestinal: Negative for abdominal pain, constipation, diarrhea, nausea and vomiting.  Genitourinary: Negative for dysuria, frequency and urgency.  Musculoskeletal: Negative for myalgias.  Skin: Negative for rash.  Neurological: Negative for dizziness, tremors and headaches.  Endo/Heme/Allergies: Negative for polydipsia.  Psychiatric/Behavioral: The patient is not nervous/anxious.     Physical Exam: Vitals:   12/31/15 1015 12/31/15 1045 12/31/15 1115 12/31/15 1215  BP: 160/92 153/93 174/76 138/83  Pulse: (!) 123 115 115 112  Resp: '19 22 19 24  '$ SpO2: 97% 96% 99% 97%   Physical Exam  Constitutional: He is oriented to person, place, and time. He appears well-developed and well-nourished. He is cooperative. No distress.  HENT:    Head: Normocephalic and atraumatic.  Right Ear: Hearing normal.  Left Ear: Hearing normal.  Nose: Nose normal.  Mouth/Throat: Mucous membranes are normal.  Cardiovascular: Normal rate, regular rhythm, S1 normal, S2 normal and intact distal pulses.  Exam reveals no gallop.   No murmur heard. Pulmonary/Chest: Effort normal and breath sounds normal. No respiratory distress. He has no wheezes. He has no rhonchi. He has no rales. He exhibits no tenderness.  Abdominal: Soft. Normal appearance and bowel sounds are normal. He exhibits no ascites. There is no hepatosplenomegaly. There is no tenderness.  Musculoskeletal: Normal range of motion. He exhibits no edema, tenderness or deformity.  Boot on the R LE.  Wound not visuallized.  Neurological: He is alert and oriented to person, place, and time. He has normal strength.  Skin: Skin is warm, dry and intact. He is not diaphoretic.  Psychiatric: He has a normal mood and affect. His speech is normal and behavior is normal.   Labs: CBC:  Recent Labs Lab 12/31/15 1016  WBC 6.5  NEUTROABS 5.2  HGB 10.1*  HCT 31.2*  MCV 89.1  PLT 619   Basic Metabolic Panel:  Recent Labs Lab 12/31/15 1016  NA 133*  K 3.7  CL 97*  CO2 24  GLUCOSE 192*  BUN 8  CREATININE 0.99  CALCIUM 8.8*   Cardiac Enzymes:  Recent Labs Lab 12/31/15 1030  TROPIPOC 0.00   Liver Function Tests:  Recent Labs Lab 12/31/15 1016  AST 20  ALT 18  ALKPHOS 149*  BILITOT 0.8  PROT 7.2  ALBUMIN 2.9*   EKG: EKG Interpretation  Date/Time:  Thursday December 31 2015 10:13:46 EST Ventricular Rate:  123 PR Interval:    QRS Duration: 97 QT Interval:  308 QTC Calculation: 441 R Axis:   55 Text Interpretation:  Sinus tachycardia Low voltage, precordial leads Artifact No significant change since last tracing Confirmed by Maryan Rued  MD, Loree Fee ([id number]) on 12/31/2015 10:52:28 AM  Imaging: Dg Chest Port 1 View  Result Date: 12/31/2015 CLINICAL DATA:   Preoperative evaluation for foot amputation. Hypertension. EXAM: PORTABLE CHEST 1 VIEW COMPARISON:  December 10, 2011 FINDINGS: Lungs are clear. Heart size and pulmonary vascularity are normal. No adenopathy. No bone lesions. IMPRESSION: No edema or consolidation. Electronically Signed   By: Lowella Grip III M.D.   On: 12/31/2015 10:31   Assessment & Plan by Problem: Active Problems:   Diabetes mellitus type 1 ([place])   Essential hypertension   Diabetic ulcer of left heel associated with type 1 diabetes mellitus, with necrosis of bone [facility])  Michael Hutchinson is a 40 y.o. male with HTN, t1DM who presents for L BKA of infected diabetic foot ulcer.  1) Infection of chronic non-healing diabetic foot ulcer: seen by ortho w/ plan for L BKA tomorrow. Infection control surgically w/ periop abx, no systemic abx indicated presently. Ortho following. - admit to med-surg - NPO @ [facility] - hold heparin DVT ppx 6hr prior to OR  2) t1DM: Hgb A1c 10.8 (09/15/15). On Lantus 40U qHS + Lispro SSI (4U/30carb). - Lantus 20U qHS + SSI-m  3) HTN: BP currently well controlled. Home meds: HCTZ '25mg'$ , losartan '50mg'$ . Continue home meds.  4) HLD: continue statin.  DVT PPx - heparin  Code Status - Full  Consults Placed - Ortho  Dispo: Admit patient to Inpatient with expected length of stay greater than 2 midnights.  Signed: Holley Raring, MD 12/31/2015, 2:51 PM  Pager: [phone]

## 2015-12-31 NOTE — ED Triage Notes (Signed)
Pt presents for evaluation of possible infection to L lower ankle/leg. Pt reports noticed foul smelling drainage and redness sometime last week. Pt. States has had ongoing injury to leg since march with multiple open wounds. Pt. States plan to potentially have sx with Dr  Josem Kaufmann this afternoon.

## 2016-01-01 ENCOUNTER — Encounter (HOSPITAL_COMMUNITY)
Admission: EM | Disposition: A | Discharge status: Home / Self Care | Payer: Self-pay | Source: Home / Self Care | Attending: Internal Medicine

## 2016-01-01 ENCOUNTER — Inpatient Hospital Stay (HOSPITAL_COMMUNITY): Payer: BLUE CROSS/BLUE SHIELD | Admitting: Certified Registered Nurse Anesthetist

## 2016-01-01 DIAGNOSIS — Z79899 Other long term (current) drug therapy: Secondary | ICD-10-CM

## 2016-01-01 DIAGNOSIS — E7800 Pure hypercholesterolemia, unspecified: Secondary | ICD-10-CM | POA: Diagnosis not present

## 2016-01-01 DIAGNOSIS — E10621 Type 1 diabetes mellitus with foot ulcer: Secondary | ICD-10-CM

## 2016-01-01 DIAGNOSIS — M86672 Other chronic osteomyelitis, left ankle and foot: Secondary | ICD-10-CM

## 2016-01-01 DIAGNOSIS — L97424 Non-pressure chronic ulcer of left heel and midfoot with necrosis of bone: Secondary | ICD-10-CM

## 2016-01-01 DIAGNOSIS — E785 Hyperlipidemia, unspecified: Secondary | ICD-10-CM

## 2016-01-01 DIAGNOSIS — G8918 Other acute postprocedural pain: Secondary | ICD-10-CM | POA: Diagnosis not present

## 2016-01-01 DIAGNOSIS — B9689 Other specified bacterial agents as the cause of diseases classified elsewhere: Secondary | ICD-10-CM

## 2016-01-01 DIAGNOSIS — E10610 Type 1 diabetes mellitus with diabetic neuropathic arthropathy: Secondary | ICD-10-CM

## 2016-01-01 DIAGNOSIS — E11621 Type 2 diabetes mellitus with foot ulcer: Secondary | ICD-10-CM | POA: Diagnosis not present

## 2016-01-01 DIAGNOSIS — L97529 Non-pressure chronic ulcer of other part of left foot with unspecified severity: Secondary | ICD-10-CM | POA: Diagnosis not present

## 2016-01-01 DIAGNOSIS — E1069 Type 1 diabetes mellitus with other specified complication: Secondary | ICD-10-CM

## 2016-01-01 DIAGNOSIS — I10 Essential (primary) hypertension: Secondary | ICD-10-CM

## 2016-01-01 DIAGNOSIS — E1065 Type 1 diabetes mellitus with hyperglycemia: Secondary | ICD-10-CM

## 2016-01-01 DIAGNOSIS — D649 Anemia, unspecified: Secondary | ICD-10-CM

## 2016-01-01 HISTORY — PX: AMPUTATION: SHX166

## 2016-01-01 LAB — TYPE AND SCREEN
ABO/RH(D): B POS
Antibody Screen: NEGATIVE

## 2016-01-01 LAB — CBC
HCT: 30.8 % — ABNORMAL LOW (ref 39.0–52.0)
Hemoglobin: 9.9 g/dL — ABNORMAL LOW (ref 13.0–17.0)
MCH: 29 pg (ref 26.0–34.0)
MCHC: 32.1 g/dL (ref 30.0–36.0)
MCV: 90.3 fL (ref 78.0–100.0)
PLATELETS: 352 10*3/uL (ref 150–400)
RBC: 3.41 MIL/uL — ABNORMAL LOW (ref 4.22–5.81)
RDW: 13.4 % (ref 11.5–15.5)
WBC: 5.4 10*3/uL (ref 4.0–10.5)

## 2016-01-01 LAB — CREATININE, SERUM: Creatinine, Ser: 0.79 mg/dL (ref 0.61–1.24)

## 2016-01-01 LAB — GLUCOSE, CAPILLARY
GLUCOSE-CAPILLARY: 206 mg/dL — AB (ref 65–99)
Glucose-Capillary: 89 mg/dL (ref 65–99)
Glucose-Capillary: 116 mg/dL — ABNORMAL HIGH (ref 65–99)
Glucose-Capillary: 132 mg/dL — ABNORMAL HIGH (ref 65–99)

## 2016-01-01 LAB — IRON AND TIBC
Iron: 24 ug/dL — ABNORMAL LOW (ref 45–182)
Saturation Ratios: 13 % — ABNORMAL LOW (ref 17.9–39.5)
TIBC: 182 ug/dL — ABNORMAL LOW (ref 250–450)
UIBC: 158 ug/dL

## 2016-01-01 LAB — FERRITIN: FERRITIN: 478 ng/mL — AB (ref 24–336)

## 2016-01-01 LAB — ABO/RH: ABO/RH(D): B POS

## 2016-01-01 LAB — SURGICAL PCR SCREEN
MRSA, PCR: NEGATIVE
Staphylococcus aureus: NEGATIVE

## 2016-01-01 LAB — HEMOGLOBIN A1C
HEMOGLOBIN A1C: 11.5 % — AB (ref 4.8–5.6)
MEAN PLASMA GLUCOSE: 283 mg/dL

## 2016-01-01 SURGERY — AMPUTATION BELOW KNEE
Anesthesia: Regional | Site: Leg Lower | Laterality: Left

## 2016-01-01 MED ORDER — MEPIVACAINE HCL 1.5 % IJ SOLN
INTRAMUSCULAR | Status: DC | PRN
  Administered 2016-01-01: 20 mL via EPIDURAL

## 2016-01-01 MED ORDER — VANCOMYCIN HCL 10 G IV SOLR
1250.0000 mg | Freq: Three times a day (TID) | INTRAVENOUS | Status: DC
  Administered 2016-01-01 – 2016-01-03 (×6): 1250 mg via INTRAVENOUS
  Filled 2016-01-01 (×7): qty 1250

## 2016-01-01 MED ORDER — METOCLOPRAMIDE HCL 5 MG/ML IJ SOLN: 5.0000 mg | Freq: Three times a day (TID) | INTRAMUSCULAR | Status: DC | PRN

## 2016-01-01 MED ORDER — PROMETHAZINE HCL 25 MG/ML IJ SOLN: 6.2500 mg | INTRAMUSCULAR | Status: DC | PRN

## 2016-01-01 MED ORDER — MIDAZOLAM HCL 2 MG/2ML IJ SOLN
INTRAMUSCULAR | Status: AC
  Filled 2016-01-01: qty 2

## 2016-01-01 MED ORDER — PHENYLEPHRINE 40 MCG/ML (10ML) SYRINGE FOR IV PUSH (FOR BLOOD PRESSURE SUPPORT)
PREFILLED_SYRINGE | INTRAVENOUS | Status: AC
  Filled 2016-01-01: qty 30

## 2016-01-01 MED ORDER — ONDANSETRON HCL 4 MG/2ML IJ SOLN: 4.0000 mg | Freq: Four times a day (QID) | INTRAMUSCULAR | Status: DC | PRN

## 2016-01-01 MED ORDER — HYDROMORPHONE HCL 1 MG/ML IJ SOLN
0.2500 mg | INTRAMUSCULAR | Status: DC | PRN
  Administered 2016-01-01 (×4): 0.5 mg via INTRAVENOUS

## 2016-01-01 MED ORDER — FLEET ENEMA 7-19 GM/118ML RE ENEM: 1.0000 | ENEMA | Freq: Once | RECTAL | Status: DC | PRN

## 2016-01-01 MED ORDER — DEXTROSE 5 % IV SOLN
2.0000 g | INTRAVENOUS | Status: DC
  Administered 2016-01-01 – 2016-01-02 (×2): 2 g via INTRAVENOUS
  Filled 2016-01-01 (×2): qty 2

## 2016-01-01 MED ORDER — MEPERIDINE HCL 25 MG/ML IJ SOLN: 6.2500 mg | INTRAMUSCULAR | Status: DC | PRN

## 2016-01-01 MED ORDER — POLYETHYLENE GLYCOL 3350 17 G PO PACK: 17.0000 g | PACK | Freq: Every day | ORAL | Status: DC | PRN

## 2016-01-01 MED ORDER — PROPOFOL 10 MG/ML IV BOLUS
INTRAVENOUS | Status: DC | PRN
  Administered 2016-01-01: 200 mg via INTRAVENOUS

## 2016-01-01 MED ORDER — DOCUSATE SODIUM 100 MG PO CAPS
100.0000 mg | ORAL_CAPSULE | Freq: Two times a day (BID) | ORAL | Status: DC
  Administered 2016-01-01 – 2016-01-03 (×4): 100 mg via ORAL
  Filled 2016-01-01 (×4): qty 1

## 2016-01-01 MED ORDER — VANCOMYCIN HCL 500 MG IV SOLR
INTRAVENOUS | Status: DC | PRN
  Administered 2016-01-01: 500 mg via TOPICAL

## 2016-01-01 MED ORDER — FENTANYL CITRATE (PF) 100 MCG/2ML IJ SOLN
INTRAMUSCULAR | Status: DC | PRN
  Administered 2016-01-01 (×2): 50 ug via INTRAVENOUS

## 2016-01-01 MED ORDER — DIPHENHYDRAMINE HCL 12.5 MG/5ML PO ELIX
12.5000 mg | ORAL_SOLUTION | ORAL | Status: DC | PRN
  Administered 2016-01-02 (×4): 25 mg via ORAL
  Filled 2016-01-01 (×4): qty 10

## 2016-01-01 MED ORDER — LIDOCAINE 2% (20 MG/ML) 5 ML SYRINGE
INTRAMUSCULAR | Status: AC
  Filled 2016-01-01: qty 20

## 2016-01-01 MED ORDER — MORPHINE SULFATE (PF) 4 MG/ML IV SOLN
4.0000 mg | INTRAVENOUS | Status: DC | PRN
  Administered 2016-01-01 – 2016-01-02 (×5): 4 mg via INTRAVENOUS
  Filled 2016-01-01 (×5): qty 1

## 2016-01-01 MED ORDER — OXYCODONE HCL 5 MG PO TABS
ORAL_TABLET | ORAL | Status: AC
  Filled 2016-01-01: qty 2

## 2016-01-01 MED ORDER — SENNA 8.6 MG PO TABS
1.0000 | ORAL_TABLET | Freq: Two times a day (BID) | ORAL | Status: DC
  Administered 2016-01-01 – 2016-01-03 (×4): 8.6 mg via ORAL
  Filled 2016-01-01 (×4): qty 1

## 2016-01-01 MED ORDER — OXYCODONE HCL 5 MG PO TABS
5.0000 mg | ORAL_TABLET | ORAL | Status: DC | PRN
  Administered 2016-01-01 – 2016-01-02 (×4): 10 mg via ORAL
  Filled 2016-01-01 (×4): qty 2

## 2016-01-01 MED ORDER — METOCLOPRAMIDE HCL 5 MG PO TABS: 5.0000 mg | ORAL_TABLET | Freq: Three times a day (TID) | ORAL | Status: DC | PRN

## 2016-01-01 MED ORDER — ACETAMINOPHEN 325 MG PO TABS
650.0000 mg | ORAL_TABLET | Freq: Four times a day (QID) | ORAL | Status: DC | PRN
  Administered 2016-01-01 – 2016-01-02 (×2): 650 mg via ORAL
  Filled 2016-01-01 (×2): qty 2

## 2016-01-01 MED ORDER — SODIUM CHLORIDE 0.9 % IV SOLN: INTRAVENOUS | Status: DC

## 2016-01-01 MED ORDER — SUGAMMADEX SODIUM 200 MG/2ML IV SOLN
INTRAVENOUS | Status: AC
  Filled 2016-01-01: qty 4

## 2016-01-01 MED ORDER — 0.9 % SODIUM CHLORIDE (POUR BTL) OPTIME
TOPICAL | Status: DC | PRN
  Administered 2016-01-01: 1000 mL

## 2016-01-01 MED ORDER — ENOXAPARIN SODIUM 40 MG/0.4ML ~~LOC~~ SOLN
40.0000 mg | SUBCUTANEOUS | Status: DC
  Administered 2016-01-02: 40 mg via SUBCUTANEOUS
  Filled 2016-01-01 (×4): qty 0.4

## 2016-01-01 MED ORDER — ROCURONIUM BROMIDE 10 MG/ML (PF) SYRINGE
PREFILLED_SYRINGE | INTRAVENOUS | Status: AC
  Filled 2016-01-01: qty 15

## 2016-01-01 MED ORDER — SODIUM CHLORIDE 0.9 % IV SOLN: Freq: Once | INTRAVENOUS | Status: DC

## 2016-01-01 MED ORDER — PHENYLEPHRINE HCL 10 MG/ML IJ SOLN
INTRAMUSCULAR | Status: DC | PRN
  Administered 2016-01-01: 120 ug via INTRAVENOUS
  Administered 2016-01-01: 80 ug via INTRAVENOUS

## 2016-01-01 MED ORDER — ONDANSETRON HCL 4 MG/2ML IJ SOLN
INTRAMUSCULAR | Status: AC
  Filled 2016-01-01: qty 8

## 2016-01-01 MED ORDER — PROPOFOL 10 MG/ML IV BOLUS
INTRAVENOUS | Status: AC
  Filled 2016-01-01: qty 20

## 2016-01-01 MED ORDER — FENTANYL CITRATE (PF) 100 MCG/2ML IJ SOLN
INTRAMUSCULAR | Status: AC
  Filled 2016-01-01: qty 4

## 2016-01-01 MED ORDER — ONDANSETRON HCL 4 MG PO TABS: 4.0000 mg | ORAL_TABLET | Freq: Four times a day (QID) | ORAL | Status: DC | PRN

## 2016-01-01 MED ORDER — PROPOFOL 500 MG/50ML IV EMUL
INTRAVENOUS | Status: DC | PRN
  Administered 2016-01-01: 75 ug/kg/min via INTRAVENOUS

## 2016-01-01 MED ORDER — MIDAZOLAM HCL 5 MG/5ML IJ SOLN
INTRAMUSCULAR | Status: DC | PRN
  Administered 2016-01-01: 2 mg via INTRAVENOUS

## 2016-01-01 MED ORDER — ONDANSETRON HCL 4 MG/2ML IJ SOLN
INTRAMUSCULAR | Status: DC | PRN
  Administered 2016-01-01: 4 mg via INTRAVENOUS

## 2016-01-01 MED ORDER — BUPIVACAINE HCL (PF) 0.5 % IJ SOLN
INTRAMUSCULAR | Status: DC | PRN
  Administered 2016-01-01: 40 mL via PERINEURAL

## 2016-01-01 MED ORDER — HYDROMORPHONE HCL 2 MG/ML IJ SOLN
INTRAMUSCULAR | Status: AC
  Administered 2016-01-01: 0.5 mg
  Filled 2016-01-01: qty 1

## 2016-01-01 MED ORDER — VANCOMYCIN HCL 500 MG IV SOLR
500.0000 mg | INTRAVENOUS | Status: DC
  Filled 2016-01-01: qty 500

## 2016-01-01 MED ORDER — DEXAMETHASONE SODIUM PHOSPHATE 10 MG/ML IJ SOLN
INTRAMUSCULAR | Status: AC
  Filled 2016-01-01: qty 3

## 2016-01-01 MED ORDER — BISACODYL 10 MG RE SUPP: 10.0000 mg | Freq: Every day | RECTAL | Status: DC | PRN

## 2016-01-01 MED ORDER — ATROPINE SULFATE 1 MG/ML IJ SOLN
INTRAMUSCULAR | Status: AC
  Filled 2016-01-01: qty 1

## 2016-01-01 MED ORDER — TRAMADOL HCL 50 MG PO TABS
50.0000 mg | ORAL_TABLET | Freq: Four times a day (QID) | ORAL | Status: DC | PRN
  Administered 2016-01-01: 50 mg via ORAL
  Filled 2016-01-01: qty 1

## 2016-01-01 MED ORDER — ACETAMINOPHEN 650 MG RE SUPP: 650.0000 mg | Freq: Four times a day (QID) | RECTAL | Status: DC | PRN

## 2016-01-01 SURGICAL SUPPLY — 56 items
BANDAGE ACE 6X5 VEL STRL LF (GAUZE/BANDAGES/DRESSINGS) ×1 IMPLANT
BANDAGE ESMARK 6X9 LF (GAUZE/BANDAGES/DRESSINGS) IMPLANT
BLADE SAW RECIP 87.9 MT (BLADE) ×2 IMPLANT
BNDG CMPR 9X6 STRL LF SNTH (GAUZE/BANDAGES/DRESSINGS) ×1
BNDG COHESIVE 6X5 TAN STRL LF (GAUZE/BANDAGES/DRESSINGS) ×4 IMPLANT
BNDG ESMARK 6X9 LF (GAUZE/BANDAGES/DRESSINGS) ×2
CANISTER SUCT 3000ML PPV (MISCELLANEOUS) ×2 IMPLANT
CHLORAPREP W/TINT 26ML (MISCELLANEOUS) ×2 IMPLANT
COVER SURGICAL LIGHT HANDLE (MISCELLANEOUS) ×2 IMPLANT
CUFF TOURNIQUET SINGLE 34IN LL (TOURNIQUET CUFF) ×1 IMPLANT
DRAPE HALF SHEET 40X57 (DRAPES) ×2 IMPLANT
DRAPE INCISE IOBAN 66X45 STRL (DRAPES) ×2 IMPLANT
DRAPE PROXIMA HALF (DRAPES) ×4 IMPLANT
DRAPE U-SHAPE 47X51 STRL (DRAPES) ×4 IMPLANT
DRSG MEPITEL 4X7.2 (GAUZE/BANDAGES/DRESSINGS) ×3 IMPLANT
DRSG PAD ABDOMINAL 8X10 ST (GAUZE/BANDAGES/DRESSINGS) ×6 IMPLANT
ELECT CAUTERY BLADE 6.4 (BLADE) ×1 IMPLANT
ELECT REM PT RETURN 9FT ADLT (ELECTROSURGICAL) ×2
ELECTRODE REM PT RTRN 9FT ADLT (ELECTROSURGICAL) ×1 IMPLANT
EVACUATOR 1/8 PVC DRAIN (DRAIN) IMPLANT
GAUZE SPONGE 4X4 12PLY STRL (GAUZE/BANDAGES/DRESSINGS) ×2 IMPLANT
GLOVE BIO SURGEON STRL SZ7 (GLOVE) ×2 IMPLANT
GLOVE BIO SURGEON STRL SZ8 (GLOVE) ×4 IMPLANT
GLOVE BIOGEL PI IND STRL 8 (GLOVE) ×2 IMPLANT
GLOVE BIOGEL PI INDICATOR 8 (GLOVE) ×2
GLOVE ECLIPSE 7.5 STRL STRAW (GLOVE) ×4 IMPLANT
GOWN STRL REUS W/ TWL LRG LVL3 (GOWN DISPOSABLE) ×1 IMPLANT
GOWN STRL REUS W/ TWL XL LVL3 (GOWN DISPOSABLE) ×1 IMPLANT
GOWN STRL REUS W/TWL LRG LVL3 (GOWN DISPOSABLE) ×2
GOWN STRL REUS W/TWL XL LVL3 (GOWN DISPOSABLE) ×2
IMMOBILIZER KNEE 20 (SOFTGOODS) ×2
IMMOBILIZER KNEE 20 THIGH 36 (SOFTGOODS) IMPLANT
KIT BASIN OR (CUSTOM PROCEDURE TRAY) ×2 IMPLANT
KIT ROOM TURNOVER OR (KITS) ×2 IMPLANT
NS IRRIG 1000ML POUR BTL (IV SOLUTION) ×2 IMPLANT
PACK GENERAL/GYN (CUSTOM PROCEDURE TRAY) ×2 IMPLANT
PAD ARMBOARD 7.5X6 YLW CONV (MISCELLANEOUS) ×4 IMPLANT
PAD CAST 4YDX4 CTTN HI CHSV (CAST SUPPLIES) ×1 IMPLANT
PADDING CAST COTTON 4X4 STRL (CAST SUPPLIES) ×4
PADDING CAST COTTON 6X4 STRL (CAST SUPPLIES) ×2 IMPLANT
SPONGE LAP 18X18 X RAY DECT (DISPOSABLE) ×1 IMPLANT
STAPLER VISISTAT 35W (STAPLE) ×2 IMPLANT
STOCKINETTE IMPERVIOUS LG (DRAPES) ×1 IMPLANT
SUT ETHILON 2 0 FS 18 (SUTURE) ×4 IMPLANT
SUT MNCRL AB 3-0 PS2 18 (SUTURE) ×4 IMPLANT
SUT PDS 2 0 (SUTURE) IMPLANT
SUT PDS AB 0 CT 36 (SUTURE) ×2 IMPLANT
SUT PROLENE 3 0 PS 2 (SUTURE) ×2 IMPLANT
SUT PROLENE 3 0 SH 48 (SUTURE) IMPLANT
SUT SILK 2 0 (SUTURE) ×2
SUT SILK 2-0 18XBRD TIE 12 (SUTURE) ×1 IMPLANT
SWAB COLLECTION DEVICE MRSA (MISCELLANEOUS) IMPLANT
TOWEL OR 17X24 6PK STRL BLUE (TOWEL DISPOSABLE) ×2 IMPLANT
TOWEL OR 17X26 10 PK STRL BLUE (TOWEL DISPOSABLE) ×2 IMPLANT
TUBE ANAEROBIC SPECIMEN COL (MISCELLANEOUS) IMPLANT
WATER STERILE IRR 1000ML POUR (IV SOLUTION) ×2 IMPLANT

## 2016-01-01 NOTE — Anesthesia Procedure Notes (Signed)
Anesthesia Regional Block:  Popliteal block  Pre-Anesthetic Checklist: ,, timeout performed, Correct Patient, Correct Site, Correct Laterality, Correct Procedure, Correct Position, site marked, Risks and benefits discussed,  Surgical consent,  Pre-op evaluation,  At surgeon's request and post-op pain management  Laterality: Left  Prep: chloraprep       Needles:  Injection technique: Single-shot  Needle Type: Stimiplex     Needle Length: 10cm 10 cm Needle Gauge: 21 and 21 G    Additional Needles:  Procedures: ultrasound guided (picture in chart) and nerve stimulator  Motor weakness within 5 minutes. Popliteal block  Nerve Stimulator or Paresthesia:  Response: Plantar flexion/toe flexion, 0.5 mA,   Additional Responses:   Narrative:  Start time: 01/01/2016 3:00 PM End time: 01/01/2016 3:10 PM Injection made incrementally with aspirations every 5 mL.  Performed by: Personally  Anesthesiologist: Lewie LoronGERMEROTH, Payson Crumby  Additional Notes: Nerve located and needle positioned with direct ultrasound guidance. Good perineural spread. Patient tolerated well.

## 2016-01-01 NOTE — Discharge Instructions (Signed)
Michael Frankenfield, MD °[facility] ° °Please read the following information regarding your care after surgery. ° °Medications  °You only need a prescription for the narcotic pain medicine (ex. oxycodone, Percocet, Norco).  All of the other medicines listed below are available over the counter. °X acetominophen (Tylenol) 650 mg every 4-6 hours as you need for minor pain °X oxycodone as prescribed for moderate to severe pain  ° °Narcotic pain medicine (ex. oxycodone, Percocet, Vicodin) will cause constipation.  To prevent this problem, take the following medicines while you are taking any pain medicine. °X docusate sodium (Colace) 100 mg twice a day X senna (Senokot) 2 tablets twice a day ° °X To help prevent blood clots, take a baby aspirin (81 mg) twice a day for two weeks after surgery.  You should also get up every hour while you are awake to move around.   ° °Weight Bearing °? Bear weight when you are able on your operated leg or foot. °? Bear weight only on the heel of your operated foot in the post-op shoe. °X Do not bear any weight on the operated leg or foot. ° °Cast / Splint / Dressing °X Keep your splint or cast clean and dry.  Don’t put anything (coat hanger, pencil, etc) down inside of it.  If it gets damp, use a hair dryer on the cool setting to dry it.  If it gets soaked, call the office to schedule an appointment for a cast change. °? Remove your dressing 3 days after surgery and cover the incisions with dry dressings.   ° °After your dressing, cast or splint is removed; you may shower, but do not soak or scrub the wound.  Allow the water to run over it, and then gently pat it dry. ° °Swelling °It is normal for you to have swelling where you had surgery.  To reduce swelling and pain, keep your toes above your nose for at least 3 days after surgery.  It may be necessary to keep your foot or leg elevated for several weeks.  If it hurts, it should be elevated. ° °Follow Up °Call my office at  [phone] when you are discharged from the hospital or surgery center to schedule an appointment to be seen two weeks after surgery. ° °Call my office at [phone] if you develop a fever >101.5° F, nausea, vomiting, bleeding from the surgical site or severe pain.   ° ° °

## 2016-01-01 NOTE — Transfer of Care (Signed)
Immediate Anesthesia Transfer of Care Note  Patient: Michael Hutchinson  Procedure(s) Performed: Procedure(s): AMPUTATION BELOW KNEE (Left)  Patient Location: PACU  Anesthesia Type:General  Level of Consciousness: awake, alert , oriented and patient cooperative  Airway & Oxygen Therapy: Patient Spontanous Breathing and Patient connected to nasal cannula oxygen  Post-op Assessment: Report given to RN and Post -op Vital signs reviewed and stable  Post vital signs: Reviewed and stable  Last Vitals:  Vitals:   01/01/16 1042 01/01/16 1409  BP: 140/85 (!) 142/92  Pulse: (!) 102 (!) 109  Resp: 18 18  Temp: 37.5 C 37 C    Last Pain:  Vitals:   01/01/16 1409  TempSrc: Oral  PainSc:          Complications: No apparent anesthesia complications

## 2016-01-01 NOTE — Anesthesia Procedure Notes (Addendum)
Anesthesia Regional Block:  Adductor canal block  Pre-Anesthetic Checklist: ,, timeout performed, Correct Patient, Correct Site, Correct Laterality, Correct Procedure, Correct Position, site marked, Risks and benefits discussed,  Surgical consent,  Pre-op evaluation,  At surgeon's request and post-op pain management  Laterality: Left  Prep: chloraprep       Needles:  Injection technique: Single-shot  Needle Type: Stimiplex     Needle Length: 9cm 9 cm Needle Gauge: 21 and 21 G    Additional Needles:  Procedures: ultrasound guided (picture in chart) Adductor canal block Narrative:  Start time: 01/01/2016 3:10 PM End time: 01/01/2016 3:20 PM Injection made incrementally with aspirations every 5 mL.  Performed by: Personally  Anesthesiologist: Lewie Loron  Additional Notes: BP cuff, EKG monitors applied. Sedation begun. Artery and nerve location verified with U/S and anesthetic injected incrementally, slowly, and after negative aspirations under direct u/s guidance. Good fascial /perineural spread. Tolerated well.

## 2016-01-01 NOTE — Progress Notes (Signed)
Inpatient Diabetes Program Recommendations  AACE/ADA: New Consensus Statement on Inpatient Glycemic Control (2015)  Target Ranges:  Prepandial:   less than 140 mg/dL      Peak postprandial:   less than 180 mg/dL (1-2 hours)      Critically ill patients:  140 - 180 mg/dL   Lab Results  Component Value Date   GLUCAP 206 (H) 01/01/2016   HGBA1C 11.5 (H) 12/31/2015    Review of Glycemic Control  Diabetes history: DM type 1 since 40 years of age Outpatient Diabetes medications: Lantus 40 units at HS/daily and humalog 3-20 units tidwc  Current orders for Inpatient glycemic control: Lantus 25 units and moderate correction tidwc  Inpatient Diabetes Program Recommendations: Please consider increase in basal lantus to 40 units (home dose although metabolic needs calculate to at least 50 units per day). Continue with moderate correction tidwc and when eating add meal coverage of 3-5 units tidwc.  Thank you Lenor CoffinAnn Madalyn Legner, RN, MSN, CDE  Diabetes Inpatient Program Office: [phone] Pager: [phone] 8:00 am to 5:00 pm

## 2016-01-01 NOTE — Progress Notes (Signed)
Subjective: No significant events overnight.  NPO since midnight.  Pt presents now for L BKA.   Objective: Vital signs in last 24 hours: Temp:  [98.6 F (37 C)-99.5 F (37.5 C)] 98.6 F (37 C) (12/22 1409) Pulse Rate:  [102-109] 109 (12/22 1409) Resp:  [17-18] 18 (12/22 1409) BP: (132-152)/(85-92) 142/92 (12/22 1409) SpO2:  [97 %-100 %] 100 % (12/22 1409)  Intake/Output from previous day: 12/21 0701 - 12/22 0700 In: 1855 [P.O.:480; I.V.:1375] Out: -  Intake/Output this shift: No intake/output data recorded.   Recent Labs  12/31/15 1016  HGB 10.1*    Recent Labs  12/31/15 1016  WBC 6.5  RBC 3.50*  HCT 31.2*  PLT 347    Recent Labs  12/31/15 1016  NA 133*  K 3.7  CL 97*  CO2 24  BUN 8  CREATININE 0.99  GLUCOSE 192*  CALCIUM 8.8*   No results for input(s): LABPT, INR in the last 72 hours.  PE:  wn wd male in nad.  L ankle with ulcer and expposed bone.  Assessment/Plan: L charcot foot / ankle and diabetic ulcer lateral ankle with osteomyelitis - to OR for L BKA.  The risks and benefits of the alternative treatment options have been discussed in detail.  The patient wishes to proceed with surgery and specifically understands risks of bleeding, infection, nerve damage, blood clots, need for additional surgery, amputation and death.    Toni ArthursHEWITT, Brendia Dampier 01/01/2016, 3:16 PM

## 2016-01-01 NOTE — Op Note (Signed)
NAME:  Michael Hutchinson, NORTHRIP NO.:  [id number]  MEDICAL RECORD NO.:  [id number]  LOCATION:  [id number]                        FACILITY:  [facility]  PHYSICIAN:  Toni Arthurs, MD        DATE OF BIRTH:  10/23/1975  DATE OF PROCEDURE:  12/31/2015 DATE OF DISCHARGE:                              OPERATIVE REPORT   PREOPERATIVE DIAGNOSES: 1. Left foot and ankle Charcot deformity. 2. Left ankle diabetic ulcer. 3. Left ankle osteomyelitis.  POSTOPERATIVE DIAGNOSES: 1. Left foot and ankle Charcot deformity. 2. Left ankle diabetic ulcer. 3. Left ankle osteomyelitis.  PROCEDURE:  Left below-knee amputation.  SURGEON:  Toni Arthurs, MD  ANESTHESIA:  General, regional.  ESTIMATED BLOOD LOSS:  Minimal.  TOURNIQUET TIME:  40 minutes at 350 mmHg.  COMPLICATIONS:  None apparent.  DISPOSITION:  Extubated, awake and stable to recovery.  INDICATIONS FOR PROCEDURE:  The patient is a 40 year old male with past medical history significant for diabetes.  He has longstanding Charcot deformity of the left foot that is progressed to the ankle.  He has failed nonoperative treatment.  He has had rapid progression of ankle deformity and now has a large ulcer on the lateral aspect of the ankle that probes to bone.  He presents today for left below-knee amputation. He understands the risks and benefits of the alternative treatment options and elects surgical treatment.  He specifically understands the risks of bleeding, infection, nerve damage, blood clots, need for additional surgery, continued pain, revision amputation and death.  PROCEDURE IN DETAIL:  After preoperative consent was obtained and the correct operative site was identified, the patient was brought to the operating room and placed supine on the operating table.  General anesthesia was induced.  Preoperative antibiotics were administered. Surgical time-out was taken.  Left lower extremity was prepped and draped in standard sterile  fashion with tourniquet around the thigh. Extremity was exsanguinated and the tourniquet was inflated to 350 mmHg. A posterior flap incision was marked on the skin 15 cm from the medial joint line.  The incision was made and dissection was carried down through the skin and subcutaneous tissues.  The anterior tibial periosteum was incised and elevated proximally.  The soft tissues were carefully protected and the tibia was cut with a reciprocating saw 1 cm proximal from the skin incision.  Sharp dissection was carried down through the anterior compartment, the fibula was exposed.  The fibula was cut 1 cm proximal from the tibial cut again taking care to protect the soft tissues.  The amputation knife was then used to cut through the posterior soft tissues bevelling the posterior flap appropriately.  The leg was then passed off the field as a specimen to Pathology.  The named vascular structures were suture ligated with 2-0 silk ties.  The named nerves were all transected sharply under gentle traction and allowed to retract into the proximal soft tissue envelope.  Remaining vessels were cauterized.  The tibial cut was beveled appropriately with a rasp. Wound was irrigated again copiously, the vancomycin powder was sprinkled to the wound.  The gastrocnemius tendon was repaired to the anterior periosteum with imbricating sutures of 0 PDS.  Deep  fascia was repaired with simple sutures of 0 PDS.  Superficial subcutaneous tissues were approximated with inverted simple sutures of 3-0 Monocryl.  Skin incision was closed with horizontal mattress sutures of 2-0 nylon. Sterile dressings were applied followed by a compression wrap. Tourniquet was released after application of the dressings at 40 minutes.  The patient was awakened from anesthesia and transported to the recovery room in stable condition.  FOLLOWUP PLAN:  The patient will be nonweightbearing on the left lower extremity and will start  physical therapy and occupational therapy. Case management consult will be obtained for durable medical equipment. He will start Lovenox for DVT prophylaxis tomorrow.     Toni Arthurs, MD     JH/MEDQ  D:  01/01/2016  T:  01/01/2016  Job:  [id number]

## 2016-01-01 NOTE — Progress Notes (Signed)
CBG taken by NT at 1105, RN present.  Blood glucose reading was 122.  Information has not transferred to [facility].

## 2016-01-01 NOTE — Brief Op Note (Signed)
12/31/2015 - 01/01/2016  4:41 PM  PATIENT:  Michael Hutchinson  40 y.o. male  PRE-OPERATIVE DIAGNOSIS: 1.  Left foot  / ankle charcot deformity      2.  Left ankle diabetic ulcer      3.  Left ankle osteomyelitis   POST-OPERATIVE DIAGNOSIS:  same  Procedure(s): Left below knee amputation  SURGEON:  Toni ArthursJohn Randee Huston, MD  ASSISTANT: n/a  ANESTHESIA:   General, regional  EBL:  minimal   TOURNIQUET:   Total Tourniquet Time Documented: Thigh (Left) - 40 minutes Total: Thigh (Left) - 40 minutes  COMPLICATIONS:  None apparent  DISPOSITION:  Extubated, awake and stable to recovery.  DICTATION ID:  [id number]

## 2016-01-01 NOTE — Anesthesia Procedure Notes (Signed)
Procedure Name: LMA Insertion Date/Time: 01/01/2016 3:48 PM Performed by: Kelly Splinter B Pre-anesthesia Checklist: Patient identified, Emergency Drugs available, Suction available and Patient being monitored Patient Re-evaluated:Patient Re-evaluated prior to inductionOxygen Delivery Method: Circle System Utilized Preoxygenation: Pre-oxygenation with 100% oxygen Intubation Type: IV induction Ventilation: Mask ventilation without difficulty LMA: LMA inserted LMA Size: 5.0 Number of attempts: 1 Airway Equipment and Method: Bite block Placement Confirmation: positive ETCO2 Tube secured with: Tape Dental Injury: Teeth and Oropharynx as per pre-operative assessment

## 2016-01-01 NOTE — Progress Notes (Signed)
   Subjective:   Feeling fine. Feeling bored and restless being in the hospital. No pain. No other complaints. Virals stable overnight.   Objective:  Vital signs in last 24 hours: Vitals:   12/31/15 1500 12/31/15 1632 12/31/15 2050 01/01/16 0628  BP: 130/73 (!) 152/88 (!) 149/85 132/85  Pulse: 108 (!) 105 (!) 108 (!) 106  Resp:  17 18 17   Temp:  99.1 F (37.3 C) 99 F (37.2 C) 98.7 F (37.1 C)  TempSrc:  Oral Oral Oral  SpO2: 97% 99% 99% 98%   Vitals reviewed. General: obese male, walking in the hallway initially, then was sitting in chair. NAD HEENT: PERRL, EOMI, no scleral icterus Cardiac: RRR, no rubs, murmurs or gallops Pulm: clear to auscultation bilaterally, no wheezes, rales, or rhonchi Abd: soft, nontender, nondistended, BS present Ext: left leg has ortho boot. Is able to walk on left leg without pain or problems.  Neuro: alert and oriented X3,   Assessment/Plan:  Active Problems:   Diabetes mellitus type 1 (HCC)   Essential hypertension   Diabetic ulcer of left heel associated with type 1 diabetes mellitus, with necrosis of bone (HCC)   Ankle wound  40 yo male with DM type I with b/l charcot foot is here with nonhealing diabetic ulcer on left with possible osteomyelitis.  Left lateral hindfoot non healing diabetic ulcer with possible osteomyelitis  Started having fever on Sunday along with chills, and noticed drainage from left ankle. Continued to be febrile in the ortho office. Afebrile here currently. Vitals are stable. Does not appear septic. - appreciate ortho assistance. To OR this afternoon for possible BKA - hold abx for now. Will start empirical abx after surgery and narrow down based on culture - f/up ortho recs  Type I DM - uncontrolled, hgba1c 11.5 CBGs are ok 200 or less.  - cont lantus 20 (reduced from home dose of 40) + SSI  HTN - cont home meds hctz 25mg  + losartan 50mg    HLD - cont statin  Normocytic anemia - b/l  hgb ~13 Now 10.1.  -  trend cbc - check iron panel  Dispo: Anticipated discharge in approximately 2-3 day(s).   Hyacinth Meeker, MD 01/01/2016, 9:41 AM Pager: [phone]

## 2016-01-01 NOTE — Progress Notes (Signed)
Pharmacy Antibiotic Note  Michael Hutchinson is a 40 y.o. male admitted on 12/31/2015 with osteomyelitis.  Pharmacy has been consulted for vancomycin and ceftriaxone dosing.  SCr 0.79  Vancomycin trough 15-20  Plan: - ceftriaxone 2g iv q24h - vancomycin 1250 mg iv q8 - monitor renal function - check vancomycin trough when it's appropriate     Temp (24hrs), Avg:98.6 F (37 C), Min:97.7 F (36.5 C), Max:99.5 F (37.5 C)   Recent Labs Lab 12/31/15 1016 12/31/15 1032 01/01/16 1842  WBC 6.5  --  5.4  CREATININE 0.99  --  0.79  LATICACIDVEN  --  1.25  --     CrCl cannot be calculated (Unknown ideal weight.).    Allergies  Allergen Reactions  . Influenza Vac Split Quad Nausea And Vomiting    Thank you for allowing pharmacy to be a part of this patient's care.  Shawneequa Baldridge, Tsz-Yin 01/01/2016 8:06 PM

## 2016-01-01 NOTE — Anesthesia Preprocedure Evaluation (Signed)
Anesthesia Evaluation  Patient identified by MRN, date of birth, ID band Patient awake    Reviewed: Allergy & Precautions, H&P , NPO status , Patient's Chart, lab work & pertinent test results  History of Anesthesia Complications (+) history of anesthetic complications  Airway Mallampati: II  TM Distance: >3 FB Neck ROM: Full    Dental no notable dental hx. (+) Teeth Intact, Dental Advisory Given   Pulmonary shortness of breath,    Pulmonary exam normal breath sounds clear to auscultation       Cardiovascular hypertension, Normal cardiovascular exam Rhythm:Regular Rate:Normal     Neuro/Psych    GI/Hepatic GERD  ,  Endo/Other  diabetes, Well Controlled, Type 1, Insulin DependentMorbid obesity  Renal/GU      Musculoskeletal  (+) Arthritis ,   Abdominal (+) + obese,   Peds  Hematology  (+) anemia ,   Anesthesia Other Findings   Reproductive/Obstetrics                                                             Anesthesia Evaluation  Patient identified by MRN, date of birth, ID band Patient awake    Reviewed: Allergy & Precautions, H&P , NPO status , Patient's Chart, lab work & pertinent test results  Airway Mallampati: I TM Distance: >3 FB Neck ROM: Full    Dental  (+) Teeth Intact and Dental Advisory Given   Pulmonary  breath sounds clear to auscultation        Cardiovascular Rhythm:Regular Rate:Normal     Neuro/Psych    GI/Hepatic   Endo/Other  diabetes, Well Controlled, Type 1, Insulin DependentMorbid obesity  Renal/GU      Musculoskeletal   Abdominal   Peds  Hematology   Anesthesia Other Findings   Reproductive/Obstetrics                          Anesthesia Physical Anesthesia Plan  ASA: II  Anesthesia Plan: General   Post-op Pain Management:    Induction: Intravenous  Airway Management Planned: LMA  Additional  Equipment:   Intra-op Plan:   Post-operative Plan: Extubation in OR  Informed Consent: I have reviewed the patients History and Physical, chart, labs and discussed the procedure including the risks, benefits and alternatives for the proposed anesthesia with the patient or authorized representative who has indicated his/her understanding and acceptance.   Dental advisory given  Plan Discussed with: CRNA, Anesthesiologist and Surgeon  Anesthesia Plan Comments:         Anesthesia Quick Evaluation  Anesthesia Physical  Anesthesia Plan  ASA: III  Anesthesia Plan: General and Regional   Post-op Pain Management: GA combined w/ Regional for post-op pain   Induction: Intravenous  Airway Management Planned: LMA  Additional Equipment:   Intra-op Plan:   Post-operative Plan: Extubation in OR  Informed Consent: I have reviewed the patients History and Physical, chart, labs and discussed the procedure including the risks, benefits and alternatives for the proposed anesthesia with the patient or authorized representative who has indicated his/her understanding and acceptance.   Dental advisory given  Plan Discussed with: CRNA  Anesthesia Plan Comments:        Anesthesia Quick Evaluation

## 2016-01-01 NOTE — Progress Notes (Addendum)
Pt still c/o extreme pain despite morphine 4 mg IV and oxycodone 10 mg p.o has given. Paged on call MD and let them know that per pt dilaudid did work better for him. On call MD ordered tramadol 50 mg every 6 hours instead. Agreed to take tramadol but was unhappy about it and verbalized that nobody believes him about his pain issue. Explained to the pt to give a time for the pain medicine to work. Pt verbalized understanding. Nursing will continue to monitor.

## 2016-01-02 DIAGNOSIS — Z89512 Acquired absence of left leg below knee: Secondary | ICD-10-CM

## 2016-01-02 DIAGNOSIS — G8918 Other acute postprocedural pain: Secondary | ICD-10-CM

## 2016-01-02 LAB — GLUCOSE, CAPILLARY
GLUCOSE-CAPILLARY: 184 mg/dL — AB (ref 65–99)
Glucose-Capillary: 110 mg/dL — ABNORMAL HIGH (ref 65–99)
Glucose-Capillary: 145 mg/dL — ABNORMAL HIGH (ref 65–99)
Glucose-Capillary: 191 mg/dL — ABNORMAL HIGH (ref 65–99)

## 2016-01-02 LAB — BASIC METABOLIC PANEL
ANION GAP: 6 (ref 5–15)
BUN: 6 mg/dL (ref 6–20)
CO2: 30 mmol/L (ref 22–32)
Calcium: 8.3 mg/dL — ABNORMAL LOW (ref 8.9–10.3)
Chloride: 102 mmol/L (ref 101–111)
Creatinine, Ser: 1.04 mg/dL (ref 0.61–1.24)
GLUCOSE: 113 mg/dL — AB (ref 65–99)
POTASSIUM: 3.8 mmol/L (ref 3.5–5.1)
Sodium: 138 mmol/L (ref 135–145)

## 2016-01-02 LAB — CBC
HCT: 29.1 % — ABNORMAL LOW (ref 39.0–52.0)
Hemoglobin: 9.1 g/dL — ABNORMAL LOW (ref 13.0–17.0)
MCH: 28.5 pg (ref 26.0–34.0)
MCHC: 31.3 g/dL (ref 30.0–36.0)
MCV: 91.2 fL (ref 78.0–100.0)
PLATELETS: 340 10*3/uL (ref 150–400)
RBC: 3.19 MIL/uL — AB (ref 4.22–5.81)
RDW: 13.2 % (ref 11.5–15.5)
WBC: 5.2 10*3/uL (ref 4.0–10.5)

## 2016-01-02 LAB — HIV ANTIBODY (ROUTINE TESTING W REFLEX): HIV SCREEN 4TH GENERATION: NONREACTIVE

## 2016-01-02 LAB — SEDIMENTATION RATE: Sed Rate: 100 mm/hr — ABNORMAL HIGH (ref 0–16)

## 2016-01-02 MED ORDER — OXYCODONE HCL 5 MG PO TABS
5.0000 mg | ORAL_TABLET | ORAL | Status: DC | PRN
  Administered 2016-01-02: 15 mg via ORAL
  Filled 2016-01-02: qty 3

## 2016-01-02 MED ORDER — HYDROMORPHONE HCL 2 MG/ML IJ SOLN: 1.0000 mg | INTRAMUSCULAR | Status: DC | PRN

## 2016-01-02 MED ORDER — KETOROLAC TROMETHAMINE 30 MG/ML IJ SOLN
30.0000 mg | Freq: Four times a day (QID) | INTRAMUSCULAR | Status: DC | PRN
  Administered 2016-01-02: 30 mg via INTRAVENOUS
  Filled 2016-01-02 (×2): qty 1

## 2016-01-02 MED ORDER — OXYCODONE-ACETAMINOPHEN 5-325 MG PO TABS
1.0000 | ORAL_TABLET | ORAL | Status: DC | PRN
  Administered 2016-01-02 – 2016-01-03 (×8): 1 via ORAL
  Filled 2016-01-02 (×8): qty 1

## 2016-01-02 MED ORDER — OXYCODONE HCL 5 MG PO TABS
5.0000 mg | ORAL_TABLET | ORAL | Status: DC | PRN
  Administered 2016-01-02 – 2016-01-03 (×8): 5 mg via ORAL
  Filled 2016-01-02 (×7): qty 1

## 2016-01-02 MED ORDER — HYDROMORPHONE HCL 2 MG/ML IJ SOLN: 1.0000 mg | INTRAMUSCULAR | Status: DC

## 2016-01-02 MED ORDER — OXYCODONE HCL 5 MG PO TABS
5.0000 mg | ORAL_TABLET | ORAL | Status: DC | PRN
  Filled 2016-01-02: qty 1

## 2016-01-02 MED ORDER — KETOROLAC TROMETHAMINE 30 MG/ML IJ SOLN: 30.0000 mg | Freq: Four times a day (QID) | INTRAMUSCULAR | Status: DC | PRN

## 2016-01-02 MED ORDER — KETOROLAC TROMETHAMINE 30 MG/ML IJ SOLN
30.0000 mg | Freq: Once | INTRAMUSCULAR | Status: AC
  Administered 2016-01-02: 30 mg via INTRAVENOUS
  Filled 2016-01-02: qty 1

## 2016-01-02 MED ORDER — HYDROMORPHONE HCL 2 MG/ML IJ SOLN
1.0000 mg | INTRAMUSCULAR | Status: DC | PRN
  Administered 2016-01-02: 2 mg via INTRAVENOUS
  Filled 2016-01-02: qty 1

## 2016-01-02 NOTE — Progress Notes (Signed)
Orthopedic Tech Progress Note Patient Details:  Levy PupaWilliam C Stones 01-Jan-1976 [id number]  Patient ID: Levy PupaWilliam C Wynter, male   DOB: 01-Jan-1976, 40 y.o.   MRN: [id number]   Nikki DomCrawford, Crucita Lacorte 01/02/2016, 10:10 AM Called in bio-tech brace order; spoke with Tenny Crawoss

## 2016-01-02 NOTE — Evaluation (Signed)
Occupational Therapy Evaluation Patient Details Name: Michael Hutchinson MRN: [id number] DOB: Mar 27, 1975 Today's Date: 01/02/2016    History of Present Illness 40 yo male admitted on 12/31/15 with left charcot foot, ankle diabetic ulcer, and ankle osteomyelitis who underwent a left BKA. PMH significant for DM1, HTN, GERD.   Clinical Impression   PTA, pt was independent with ADL and functional mobility. Pt currently requires max assist with LB ADL and min assist +2 for safety with stand-pivot simulated toilet transfer. Pt will have 24 hour assistance at home from wife. Pt would benefit from continued OT services while admitted to improve independence with ADL and functional mobility. OT will continue to follow acutely. Recommend home health OT services post-acute D/C and 3-in-1 BSC.    Follow Up Recommendations  Home health OT;Supervision/Assistance - 24 hour    Equipment Recommendations  3 in 1 bedside commode    Recommendations for Other Services       Precautions / Restrictions Precautions Precautions: Fall Precaution Comments: new BKA Restrictions Weight Bearing Restrictions: No      Mobility Bed Mobility Overal bed mobility: Needs Assistance Bed Mobility: Supine to Sit     Supine to sit: Min guard     General bed mobility comments: Min guard for safety to EOB  Transfers Overall transfer level: Needs assistance Equipment used: Rolling walker (2 wheeled) Transfers: Sit to/from [employer] Sit to Stand: Min assist;+2 safety/equipment Stand pivot transfers: Min assist;+2 safety/equipment       General transfer comment: Min A for safety with sit to stand and standing pivot to assist with balancing on LLE    Balance Overall balance assessment: Needs assistance Sitting-balance support: No upper extremity supported;Feet supported Sitting balance-Leahy Scale: Good Sitting balance - Comments: sitting EOB no back support   Standing balance support:  Bilateral upper extremity supported Standing balance-Leahy Scale: Zero Standing balance comment: relies heavily on RW for support in standing and for transfer to recliner                            ADL Overall ADL's : Needs assistance/impaired     Grooming: Set up;Sitting   Upper Body Bathing: Set up;Sitting   Lower Body Bathing: Maximal assistance;Sit to/from stand   Upper Body Dressing : Set up;Sitting   Lower Body Dressing: Maximal assistance;Sit to/from stand   Toilet Transfer: +2 for safety/equipment;Moderate assistance;BSC;RW;Stand-pivot;Minimal assistance   Toileting- Clothing Manipulation and Hygiene: Maximal assistance;Sit to/from stand Toileting - Clothing Manipulation Details (indicate cue type and reason): Max assist due to need for B UE support       General ADL Comments: Pt and wife educated on ADL techniques, DME options, and potential home modifications.      Vision Vision Assessment?: No apparent visual deficits   Perception     Praxis      Pertinent Vitals/Pain Pain Assessment: 0-10 Pain Score: 5  Pain Location: left Le posterior and knee Pain Descriptors / Indicators: Aching;Operative site guarding;Sore;Sharp Pain Intervention(s): Limited activity within patient's tolerance;Monitored during session;Repositioned     Hand Dominance Right   Extremity/Trunk Assessment Upper Extremity Assessment Upper Extremity Assessment: Overall WFL for tasks assessed   Lower Extremity Assessment Lower Extremity Assessment: Defer to PT evaluation   Cervical / Trunk Assessment Cervical / Trunk Assessment: Normal   Communication Communication Communication: No difficulties   Cognition Arousal/Alertness: Awake/alert Behavior During Therapy: WFL for tasks assessed/performed Overall Cognitive Status: Within Functional Limits for tasks assessed  General Comments       Exercises       Shoulder Instructions       Home Living Family/patient expects to be discharged to:: Private residence Living Arrangements: Spouse/significant other;Children Available Help at Discharge: Family;Available 24 hours/day Type of Home: House Home Access: Ramped entrance     Home Layout: One level     Bathroom Shower/Tub: Other (comment);Walk-in shower;Door (With large threshold to step over)   [employer]: Standard Bathroom Accessibility: Yes   Home Equipment: Walker - 2 wheels;Shower seat - built in;Other (comment) (knee scooter)          Prior Functioning/Environment Level of Independence: Independent        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, physical work        OT Problem List: Decreased strength;Decreased range of motion;Decreased activity tolerance;Impaired balance (sitting and/or standing);Decreased safety awareness;Decreased knowledge of use of DME or AE;Decreased knowledge of precautions;Pain   OT Treatment/Interventions: Self-care/ADL training;Therapeutic exercise;Energy conservation;Therapeutic activities;Patient/family education;Balance training    OT Goals(Current goals can be found in the care plan section) Acute Rehab OT Goals Patient Stated Goal: to get home and back to work OT Goal Formulation: With patient/family Time For Goal Achievement: 01/09/16 Potential to Achieve Goals: Good ADL Goals Pt Will Perform Lower Body Bathing: with supervision;with adaptive equipment;sit to/from stand Pt Will Perform Lower Body Dressing: with supervision;with adaptive equipment;sit to/from stand Pt Will Transfer to Toilet: with supervision;ambulating (RW) Pt Will Perform Toileting - Clothing Manipulation and hygiene: with supervision;sit to/from stand Pt Will Perform Tub/Shower Transfer: with min assist;Shower transfer;3 in 1;rolling walker;ambulating  OT Frequency: Min 2X/week   Barriers to D/C:            Co-evaluation PT/OT/SLP Co-Evaluation/Treatment: Yes Reason for  Co-Treatment: For patient/therapist safety   OT goals addressed during session: ADL's and self-care      End of Session Equipment Utilized During Treatment: Gait belt;Rolling walker Nurse Communication: Mobility status (Pt itching and requests info concerning support groups)  Activity Tolerance: Patient tolerated treatment well Patient left: in chair;with call bell/phone within reach;with family/visitor present   Time: 9562-1308 OT Time Calculation (min): 35 min Charges:  OT General Charges $OT Visit: 1 Procedure OT Evaluation $OT Eval Moderate Complexity: 1 Procedure G-CodesDoristine Section, OTR/L [phone] 01/02/2016, 10:18 AM

## 2016-01-02 NOTE — Care Management Note (Signed)
40 yo M with left charcot foot, ankle diabetic ulcer, and ankle osteomyelitis. S/p L BKA.  PT/OT are recommending HHPT/OT, 3-in-1 BSC, and a W/C with cushion and elevated leg rest.  Met with pt at bedside. He plans to return home with the support of his wife. Discussed PT/OT recommendations. He doesn't have a preference for a [place] agency. Provided pt with a list of [place] agencies.  He chose [facility] for HHPT/OT.Discussed [facility] for DME.  Contacted Butch Penny at [place] [facility] for [facility] referral and Reggie at [facility] for DME referral.

## 2016-01-02 NOTE — Progress Notes (Signed)
IM MD on call paged back regarding patients pain. Patient still rating pain 10/10 and the meds are not helping. Verbalized to MD that dilaudid did help him in past. Suggested Toradol as another option if we can try it. MD ordered one time dose of Toradol. Med given and patient reassessed. Per patient Toradol helped some and patient was able to get some sleep. Will continue to monitor patient.

## 2016-01-02 NOTE — Evaluation (Signed)
Physical Therapy Evaluation Patient Details Name: Michael Hutchinson MRN: [id number] DOB: January 15, 1975 Today's Date: 01/02/2016   History of Present Illness  40 yo male admitted on 12/31/15 with left charcot foot, ankle diabetic ulcer, and ankle osteomyelitis who underwent a left BKA. PMH significant for DM1, HTN, GERD.  Clinical Impression  Pt presents with above diagnosis requiring further PT evaluation. Pt lives with wife and children in single level home and is currently having a ramp placed at entrance when he returns home. Prior to admission, pt was working full time and was a Engineer, watervolunteer firefighter. Pt presents with increased pain and significant balance deficits which limit his ability to maneuver independently. Pt will benefit from continuing to be seen acutely in order to address the below deficits before DC home. Pt will benefit from HHPT upon discharge in order to assist with home mobility including WC management and short distance gait while pt awaits prosthesis.     Follow Up Recommendations Home health PT;Supervision - Intermittent    Equipment Recommendations  3in1 (PT);Wheelchair (measurements PT);Wheelchair cushion (measurements PT);Other (comment) (elevating leg rests)    Recommendations for Other Services       Precautions / Restrictions Precautions Precautions: Fall Precaution Comments: new BKA Restrictions Weight Bearing Restrictions: No      Mobility  Bed Mobility Overal bed mobility: Needs Assistance Bed Mobility: Supine to Sit     Supine to sit: Min guard     General bed mobility comments: Min guard for safety to EOB  Transfers Overall transfer level: Needs assistance Equipment used: Rolling walker (2 wheeled) Transfers: Sit to/from UGI CorporationStand;Stand Pivot Transfers Sit to Stand: Min assist;+2 safety/equipment Stand pivot transfers: Min assist;+2 safety/equipment       General transfer comment: Min A for safety with sit to stand and standing pivot to assist  with balancing on LLE  Ambulation/Gait                Stairs            Wheelchair Mobility    Modified Rankin (Stroke Patients Only)       Balance Overall balance assessment: Needs assistance Sitting-balance support: No upper extremity supported;Feet supported Sitting balance-Leahy Scale: Good Sitting balance - Comments: sitting EOB no back support   Standing balance support: Bilateral upper extremity supported Standing balance-Leahy Scale: Zero Standing balance comment: relies heavily on RW for support in standing and for transfer to recliner                             Pertinent Vitals/Pain Pain Assessment: 0-10 Pain Score: 6  Pain Location: left Le posterior and knee Pain Descriptors / Indicators: Aching;Operative site guarding;Sore;Sharp Pain Intervention(s): Monitored during session;Premedicated before session    Home Living Family/patient expects to be discharged to:: Private residence Living Arrangements: Spouse/significant other;Children Available Help at Discharge: Family;Available 24 hours/day Type of Home: House Home Access: Ramped entrance     Home Layout: One level Home Equipment: Walker - 2 wheels;Shower seat - built in;Other (comment) (knee scooter)      Prior Function Level of Independence: Independent         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, physical work     Higher education careers adviserHand Dominance   Dominant Hand: Right    Extremity/Trunk Assessment   Upper Extremity Assessment Upper Extremity Assessment: Defer to OT evaluation    Lower Extremity Assessment Lower Extremity Assessment: Generalized weakness    Cervical /  Trunk Assessment Cervical / Trunk Assessment: Normal  Communication   Communication: No difficulties  Cognition Arousal/Alertness: Awake/alert Behavior During Therapy: WFL for tasks assessed/performed Overall Cognitive Status: Within Functional Limits for tasks assessed                       General Comments General comments (skin integrity, edema, etc.): pt is new BKA and reports increased weakness in LLE due to tumor which is unoperable    Exercises     Assessment/Plan    PT Assessment Patient needs continued PT services  PT Problem List Decreased strength;Decreased range of motion;Decreased activity tolerance;Decreased balance;Decreased mobility;Decreased knowledge of use of DME;Pain          PT Treatment Interventions DME instruction;Gait training;Stair training;Functional mobility training;Therapeutic activities;Therapeutic exercise;Balance training;Patient/family education;Wheelchair mobility training    PT Goals (Current goals can be found in the Care Plan section)  Acute Rehab PT Goals Patient Stated Goal: to get home and back to work PT Goal Formulation: With patient/family Time For Goal Achievement: 01/09/16 Potential to Achieve Goals: Good    Frequency Min 3X/week   Barriers to discharge        Co-evaluation               End of Session Equipment Utilized During Treatment: Gait belt Activity Tolerance: Patient tolerated treatment well;Patient limited by pain Patient left: in chair;with call bell/phone within reach;with family/visitor present Nurse Communication: Mobility status         Time: 7893-8101 PT Time Calculation (min) (ACUTE ONLY): 34 min   Charges:   PT Evaluation $PT Eval Moderate Complexity: 1 Procedure     PT G Codes:        Colin Broach PT, DPT  [phone]  01/02/2016, 9:54 AM

## 2016-01-02 NOTE — Progress Notes (Signed)
   Subjective: Patient was feeling better when seen this morning, complaining of 4/10 pain at left BKA side. He was complaining of more pain overnight, was given Dilaudid and a dose of Toradol this morning.  Objective:  Vital signs in last 24 hours: Vitals:   01/01/16 1844 01/01/16 2104 01/02/16 0043 01/02/16 0423  BP: 134/78 (!) 141/85 (!) 141/73 127/72  Pulse: (!) 107 (!) 109 (!) 116 (!) 108  Resp: '20 16 16 16  '$ Temp: 98.5 F (36.9 C) 98.9 F (37.2 C) 99.6 F (37.6 C) 98.5 F (36.9 C)  TempSrc: Oral Oral Oral Oral  SpO2: 100% 99% 94% 93%   Gen. Well-developed, well-nourished, obese man, in no acute distress. Chest. Clear bilaterally. CVS. Regular rate and rhythm, no rub/murmur/gallop. Abdomen. Soft, obese, nondistended, nontender, bowel sounds positive. Extremities. Clear Ace bandage on left BKA stem. Pulses intact on right lower extremity, there was no edema or cyanosis at right lower extremity.  Labs. Iron/TIBC/Ferritin/ %Sat    Component Value Date/Time   IRON 24 (L) 01/01/2016 1027   TIBC 182 (L) 01/01/2016 1027   FERRITIN 478 (H) 01/01/2016 1027   IRONPCTSAT 13 (L) 01/01/2016 1027   Assessment/Plan:  40 yo male with DM type I with b/l charcot foot is here with nonhealing diabetic ulcer on left with possible osteomyelitis, now S/P left BKA.  Left lateral hindfoot non healing diabetic ulcer with possible osteomyelitis, s/p Left BKA. Postop day 1. Ace bandage was clean without any blood or discharge. Patient was complaining of pain, requiring Dilaudid and oxycodone. Patient was given a dose of Toradol this morning, he states that helped him the most. -Toradol 30 mg when necessary every 6 hourly for pain. -Dilaudid 1 mg when necessary for pain. -Continue vancomycin.  Type I DM - uncontrolled, hgba1c 11.5. CBG between 110-132 since surgery. - 20 units and SSI.  HTN. Blood pressure well controlled.  - cont home meds hctz '25mg'$  + losartan '50mg'$    HLD. Continue  statin  Normocytic anemia - b/l  hgb ~13 His postop hemoglobin today was 9.1. Iron studies shows high ferritin and low iron stores, consistent with anemia of chronic disease. -Check her erythropoietin and ESR, if her erythropoietin is low he might get benefit with erythropoietin stimulating agent and iron supplement. -Trend CBC   Dispo: Anticipated discharge in approximately 1 day(s).   Lorella Nimrod, MD 01/02/2016, 10:42 AM Pager: [phone]

## 2016-01-02 NOTE — Progress Notes (Signed)
   Subjective: 1 Day Post-Op Procedure(s) (LRB): AMPUTATION BELOW KNEE (Left) Patient reports pain as severe.   Patient seen in rounds for Dr. Victorino Dike.  Patient is well, and has had no acute complaints or problems other than poorly controlled pain. No SOB or chest pain. Patient expressed desire to get working with therapy.    Objective: Vital signs in last 24 hours: Temp:  [97.7 F (36.5 C)-99.6 F (37.6 C)] 98.5 F (36.9 C) (12/23 0423) Pulse Rate:  [102-117] 108 (12/23 0423) Resp:  [16-23] 16 (12/23 0423) BP: (127-147)/(72-92) 127/72 (12/23 0423) SpO2:  [93 %-100 %] 93 % (12/23 0423)  Intake/Output from previous day:  Intake/Output Summary (Last 24 hours) at 01/02/16 0720 Last data filed at 01/02/16 1540  Gross per 24 hour  Intake             2040 ml  Output               50 ml  Net             1990 ml   Labs:  Recent Labs  12/31/15 1016 01/01/16 1842 01/02/16 0528  HGB 10.1* 9.9* 9.1*    Recent Labs  01/01/16 1842 01/02/16 0528  WBC 5.4 5.2  RBC 3.41* 3.19*  HCT 30.8* 29.1*  PLT 352 340    Recent Labs  12/31/15 1016 01/01/16 1842  NA 133*  --   K 3.7  --   CL 97*  --   CO2 24  --   BUN 8  --   CREATININE 0.99 0.79  GLUCOSE 192*  --   CALCIUM 8.8*  --     EXAM General - Patient is Alert and Oriented Dressing/Incision - slight bloody drainage from incision site. ACE wrap in place.   Past Medical History:  Diagnosis Date  . Charcot's joint of foot 11/25/2013  . Complication of anesthesia    "I wake up angry" (12/31/2015)  . Diabetes mellitus type 1 (HCC) dx'd 1981  . Diabetic ketoacidosis (HCC)   . Essential hypertension 05/03/2013  . GERD (gastroesophageal reflux disease)   . High cholesterol   . Hx MRSA infection    Inner thigh and under arm- healed areas  . Meniscus tear   . Shortness of breath    with exertion only    Assessment/Plan: 1 Day Post-Op Procedure(s) (LRB): AMPUTATION BELOW KNEE (Left) Active Problems:   Diabetes  mellitus type 1 (HCC)   Essential hypertension   Diabetic ulcer of left heel associated with type 1 diabetes mellitus, with necrosis of bone (HCC)   Ankle wound   Normocytic anemia  Estimated body mass index is 45.2 kg/m as calculated from the following:   Height as of 10/12/15: 5\' 10"  (1.778 m).   Weight as of 10/12/15: 142.9 kg (315 lb). Advance diet Up with therapy  DVT Prophylaxis - Lovenox   [employer] to fit patient with stump shrinker today or tomorrow. Adjusted pain medications slightly to try to achieve more pain management. Will continue with therapy today.   Dimitri Ped, PA-C Orthopaedic Surgery 01/02/2016, 7:20 AM

## 2016-01-03 LAB — GLUCOSE, CAPILLARY
GLUCOSE-CAPILLARY: 173 mg/dL — AB (ref 65–99)
Glucose-Capillary: 155 mg/dL — ABNORMAL HIGH (ref 65–99)

## 2016-01-03 MED ORDER — POLYETHYLENE GLYCOL 3350 17 G PO PACK: 17.0000 g | PACK | Freq: Every day | ORAL | 0 refills | Status: DC | PRN

## 2016-01-03 MED ORDER — DOCUSATE SODIUM 100 MG PO CAPS: 100.0000 mg | ORAL_CAPSULE | Freq: Two times a day (BID) | ORAL | 0 refills | Status: DC

## 2016-01-03 MED ORDER — OXYCODONE-ACETAMINOPHEN 10-325 MG PO TABS: 1.0000 | ORAL_TABLET | ORAL | 0 refills | Status: DC | PRN

## 2016-01-03 MED ORDER — IBUPROFEN 800 MG PO TABS: 800.0000 mg | ORAL_TABLET | Freq: Three times a day (TID) | ORAL | 0 refills | Status: DC

## 2016-01-03 NOTE — Anesthesia Postprocedure Evaluation (Signed)
Anesthesia Post Note  Patient: Michael Hutchinson  Procedure(s) Performed: Procedure(s) (LRB): AMPUTATION BELOW KNEE (Left)  Patient location during evaluation: PACU Anesthesia Type: General and Regional Level of consciousness: sedated and patient cooperative Pain management: pain level controlled Vital Signs Assessment: post-procedure vital signs reviewed and stable Respiratory status: spontaneous breathing Cardiovascular status: stable Anesthetic complications: no       Last Vitals:  Vitals:   01/03/16 0003 01/03/16 0424  BP: 135/70 136/71  Pulse: (!) 107 (!) 106  Resp:    Temp: 37.1 C 37 C    Last Pain:  Vitals:   01/03/16 0700  TempSrc:   PainSc: 2                  Lewie Loron

## 2016-01-03 NOTE — Progress Notes (Signed)
Reviewed discharge instructions/medications with patient.  Removed patient's IV.  Answered his questions.  Patient is waiting for the wheelchair to be delivered to him room.

## 2016-01-03 NOTE — Progress Notes (Signed)
Physical Therapy Treatment Patient Details Name: Michael Hutchinson MRN: [id number] DOB: 1975-12-19 Today's Date: 01/03/2016    History of Present Illness 40 yo male admitted on 12/31/15 with left charcot foot, ankle diabetic ulcer, and ankle osteomyelitis who underwent a left BKA. PMH significant for DM1, HTN, GERD.    PT Comments    Pt presents with improved mobility with transfers and short distance gait. Pt would like to get back to walking with prosthesis in the future and encouraged pt to perform short distance gait with RW at this time in order to increase strength in RLE. Pt able to perform bed mobs and transfers with min guard to supervision this session.    Follow Up Recommendations  Home health PT;Supervision - Intermittent     Equipment Recommendations  3in1 (PT);Wheelchair (measurements PT);Wheelchair cushion (measurements PT);Other (comment) (elevating leg rests)    Recommendations for Other Services       Precautions / Restrictions Precautions Precautions: Fall Precaution Comments: new BKA Restrictions Weight Bearing Restrictions: No    Mobility  Bed Mobility Overal bed mobility: Needs Assistance Bed Mobility: Supine to Sit     Supine to sit: Modified independent (Device/Increase time)     General bed mobility comments: Mod I from bed to chair with foot of bed elevated  Transfers Overall transfer level: Needs assistance Equipment used: Rolling walker (2 wheeled) Transfers: Sit to/from Stand Sit to Stand: Min guard Stand pivot transfers: Min guard       General transfer comment: Min gaurd for safety from EOB and recliner   Ambulation/Gait Ambulation/Gait assistance: Min guard Ambulation Distance (Feet): 20 Feet (10x2) Assistive device: Rolling walker (2 wheeled) Gait Pattern/deviations: Step-to pattern Gait velocity: decreased Gait velocity interpretation: Below normal speed for age/gender General Gait Details: pt is able to perform short distance  hopping to and from recliner with min rest in between in order to begin to improve endurance and strength   Stairs            Wheelchair Mobility    Modified Rankin (Stroke Patients Only)       Balance Overall balance assessment: Needs assistance Sitting-balance support: No upper extremity supported;Feet supported Sitting balance-Leahy Scale: Good Sitting balance - Comments: sitting EOB no back support   Standing balance support: Bilateral upper extremity supported Standing balance-Leahy Scale: Poor Standing balance comment: relies heavily on RW for support in standing and for transfer to recliner                    Cognition Arousal/Alertness: Awake/alert Behavior During Therapy: WFL for tasks assessed/performed Overall Cognitive Status: Within Functional Limits for tasks assessed                      Exercises      General Comments General comments (skin integrity, edema, etc.): Wife present throughout session and observed assisting with transfer and gait as she will be PCG upon discharge       Pertinent Vitals/Pain Pain Assessment: 0-10 Pain Score: 6  Pain Location: left Le posterior and knee Pain Descriptors / Indicators: Aching Pain Intervention(s): Monitored during session;RN gave pain meds during session;Ice applied    Home Living                      Prior Function            PT Goals (current goals can now be found in the care plan section) Acute Rehab PT Goals  Patient Stated Goal: to get home and back to work Progress towards PT goals: Progressing toward goals    Frequency    Min 3X/week      PT Plan Current plan remains appropriate    Co-evaluation             End of Session Equipment Utilized During Treatment: Gait belt Activity Tolerance: Patient tolerated treatment well Patient left: in bed;with call bell/phone within reach;with family/visitor present     Time: 0388-8280 PT Time Calculation (min)  (ACUTE ONLY): 20 min  Charges:  $Gait Training: 8-22 mins                    G Codes:      Colin Broach PT, DPT  [phone]  01/03/2016, 12:11 PM

## 2016-01-03 NOTE — Progress Notes (Signed)
   Subjective: 2 Days Post-Op Procedure(s) (LRB): AMPUTATION BELOW KNEE (Left) Patient reports pain as severe.   Patient seen in rounds for Dr. Victorino Dike.  Patient is well, and has had no acute complaints or problems. No SOB or chest pain. Patient expressed desire to get working with therapy. He feels shrinker is not fitting well, but it has been placed and re-fitted by [employer] 2 x already.   Objective: Vital signs in last 24 hours: Temp:  [98.6 F (37 C)-100.2 F (37.9 C)] 98.6 F (37 C) (12/24 0424) Pulse Rate:  [106-122] 106 (12/24 0424) BP: (135-152)/(70-83) 152/81 (12/24 0845) SpO2:  [92 %-95 %] 95 % (12/24 0424)  Intake/Output from previous day:  Intake/Output Summary (Last 24 hours) at 01/03/16 0947 Last data filed at 01/02/16 2038  Gross per 24 hour  Intake                0 ml  Output                2 ml  Net               -2 ml   Labs:  Recent Labs  12/31/15 1016 01/01/16 1842 01/02/16 0528  HGB 10.1* 9.9* 9.1*    Recent Labs  01/01/16 1842 01/02/16 0528  WBC 5.4 5.2  RBC 3.41* 3.19*  HCT 30.8* 29.1*  PLT 352 340    Recent Labs  12/31/15 1016 01/01/16 1842 01/02/16 0528  NA 133*  --  138  K 3.7  --  3.8  CL 97*  --  102  CO2 24  --  30  BUN 8  --  6  CREATININE 0.99 0.79 1.04  GLUCOSE 192*  --  113*  CALCIUM 8.8*  --  8.3*    EXAM General - Patient is Alert and Oriented Dressing/Incision - slight bloody drainage from incision site. Dressing changed, wound c/d/i. Skin wwp.   Past Medical History:  Diagnosis Date  . Charcot's joint of foot 11/25/2013  . Complication of anesthesia    "I wake up angry" (12/31/2015)  . Diabetes mellitus type 1 (HCC) dx'd 1981  . Diabetic ketoacidosis (HCC)   . Essential hypertension 05/03/2013  . GERD (gastroesophageal reflux disease)   . High cholesterol   . Hx MRSA infection    Inner thigh and under arm- healed areas  . Meniscus tear   . Shortness of breath    with exertion only     Assessment/Plan: 2 Days Post-Op Procedure(s) (LRB): AMPUTATION BELOW KNEE (Left) Active Problems:   Diabetes mellitus type 1 (HCC)   Essential hypertension   Diabetic ulcer of left heel associated with type 1 diabetes mellitus, with necrosis of bone (HCC)   Ankle wound   Normocytic anemia  Estimated body mass index is 45.2 kg/m as calculated from the following:   Height as of 10/12/15: 5\' 10"  (1.778 m).   Weight as of 10/12/15: 142.9 kg (315 lb). Advance diet Up with therapy  DVT Prophylaxis - Lovenox   Wound c/d/i, ok for daily dry dressing changes shrinker fit looked reasonable on my check Ok for dc home today Follow up with Dr. Victorino Dike in 2 weeks  Maryan Rued, MD Orthopaedic Surgery 01/03/2016, 9:47 AM

## 2016-01-03 NOTE — Discharge Summary (Signed)
Name: Michael Hutchinson MRN: [id number] DOB: May 29, 1975 40 y.o. PCP: Michael Dance, DO  Date of Admission: 12/31/2015  9:43 AM Date of Discharge: 01/03/2016 Attending Physician: Michael Groves, DO  Discharge Diagnosis: 1. 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 Active Problems:   Diabetes mellitus type 1 (Michael Hutchinson)   Essential hypertension   Diabetic ulcer of left heel associated with type 1 diabetes mellitus, with necrosis of bone (HCC)   Ankle wound   Normocytic anemia   Discharge Medications: Allergies as of 01/03/2016      Reactions   Influenza Vac Split Quad Nausea And Vomiting      Medication List    STOP taking these medications   acetaminophen 325 MG tablet Commonly known as:  TYLENOL   HYDROcodone-acetaminophen 5-325 MG tablet Commonly known as:  NORCO/VICODIN     TAKE these medications   atorvastatin 40 MG tablet Commonly known as:  LIPITOR Take 1 tablet (40 mg total) by mouth daily at 6 PM. What changed:  when to take this   BAYER MICROLET LANCETS lancets Use as instructed to check 3 times daily.   BD PEN NEEDLE NANO U/F 32G X 4 MM Misc Generic drug:  Insulin Pen Needle 1 pen by Does not apply route 3 (three) times daily.   docusate sodium 100 MG capsule Commonly known as:  COLACE Take 1 capsule (100 mg total) by mouth 2 (two) times daily.   glucagon 1 MG injection Commonly known as:  GLUCAGON EMERGENCY Inject 1 mg into the muscle once as needed.   glucose blood test strip Commonly known as:  BAYER CONTOUR NEXT TEST Use as instructed to check 3 times daily.   hydrochlorothiazide 25 MG tablet Commonly known as:  HYDRODIURIL Take 1 tablet (25 mg total) by mouth daily.   ibuprofen 800 MG tablet Commonly known as:  ADVIL,MOTRIN Take 1 tablet (800 mg total) by mouth 3 (three) times daily.   insulin glargine 100 UNIT/ML injection Commonly known as:  LANTUS Inject 40 units at bedtime   insulin lispro 100 UNIT/ML injection Commonly known as:   HUMALOG Inject 3-20 Units into the skin 3 (three) times daily before meals. Sliding scale per carb count 4 units for every 30 carbs   losartan 50 MG tablet Commonly known as:  COZAAR Take 1 tablet (50 mg total) by mouth daily.   ONE TOUCH ULTRA SYSTEM KIT w/Device Kit 1 kit by Does not apply route once. Check blood sugar 3 times daily. Diagnosis Diabetes ICD-10 E11.8   oxyCODONE-acetaminophen 10-325 MG tablet Commonly known as:  PERCOCET Take 1 tablet by mouth every 4 (four) hours as needed for pain.   polyethylene glycol packet Commonly known as:  MIRALAX / GLYCOLAX Take 17 g by mouth daily as needed for mild constipation.   Vitamin D (Ergocalciferol) 50000 units Caps capsule Commonly known as:  DRISDOL Take 1 capsule (50,000 Units total) by mouth every 7 (seven) days.   Vitamin D3 5000 units Tabs 5,000 IU OTC vitamin D3 daily.            Durable Medical Equipment        Start     Ordered   01/03/16 1228  For home use only DME high strength lightweight manual wheelchair with seat cushion  Once    Comments:  Patient suffers from [place]  which impairs their ability to perform daily activities like bathing and dressing in the home.  A cane, crutch or walker will not resolve  issue  with performing activities of daily living. A wheelchair will allow patient to safely perform daily activities.  (THEN ONE OF THESE TWO:) Patient self-propels the wheelchair while engaging in frequent activities such as laundry, meals and toileting which cannot be performed in a standard or lightweight wheelchair due to the weight of the chair. Accessories: elevating leg rests (ELRs), wheel locks, extensions and anti-tippers.   01/03/16 1228   01/03/16 1016  For home use only DME Bedside commode  Once    Question:  Patient needs a bedside commode to treat with the following condition  Answer:  S/P BKA (below knee amputation) ([place])   01/03/16 1015   01/03/16 1016  For home use only DME Shower stool   Once     01/03/16 1015   01/03/16 0949  For home use only DME high strength lightweight manual wheelchair with seat cushion  Once    Comments:  Patient suffers from Left below knee amputation which impairs their ability to perform daily activities like ambulating in the home.  A walker will not resolve  issue with performing activities of daily living. A wheelchair will allow patient to safely perform daily activities.  (THEN ONE OF THESE TWO:) Patient self-propels the wheelchair while engaging in frequent activities such as toileting which cannot be performed in a standard or lightweight wheelchair due to the weight of the chair. Accessories: elevating leg rests (ELRs), wheel locks, extensions and anti-tippers.   01/03/16 0951   01/01/16 1811  DME Walker rolling  Once    Question:  Patient needs a walker to treat with the following condition  Answer:  Below knee amputation status, left ([place])   01/01/16 1811   01/01/16 1811  DME 3 n 1  Once     01/01/16 1811      Disposition and follow-up:   [name] was discharged from [facility] in Good condition.  At the hospital follow up visit please address:  1.  His postsurgical pain. Please follow-up on his erythropoietin level. And treat his anemia of chronic illness accordingly.  2.  Labs / imaging needed at time of follow-up: CBC  3.  Pending labs/ test needing follow-up: Erythropoietin and surgical pathology results.  Follow-up Appointments: Follow-up Information    Michael Hutchinson, JOHN, MD Follow up in 2 week(s).   Specialty:  Orthopedic Surgery Contact information: [address] [place] [address] [phone]           Hospital Course by problem list:  [name] is a 40 y.o. male with a h/o of type 1 diabetes, hypertension, hyperlipidemia and ongoing chronic left ankle wound who presents to the ED from his orthopaedist office for consideration of surgical management of chronic  left ankle wound, now thought to be infected.  1. Left lateral hindfoot non healing diabetic ulcer with possible osteomyelitis. Initially he was treating with antibiotics with attempt to preserve limb. His wound continued to get worse and he became febrile. He was treated with below knee amputation and antibiotics. He tolerated the procedure very well. His pain was well controlled with oxycodone, he did not required any IV pain meds. Approximately 24 hour before discharge. He is being discharged home with Percocet for pain, he will follow-up with orthopedic in 2 weeks.  2.Type I DM - uncontrolled, hgba1c 11.5. His CABG improved post surgery, he was maintained on Lantus 20 unit and sliding scale during his stay. He was discharged home on her home dose of  Lantus and Humalog.  3. Hypertension. His blood pressure remained well under control during his stay on his home dose of hydrochlorothiazide and losartan.  4. Normocytic anemia - b/l hgb ~13. His postop hemoglobin was 9.1. His iron and ferritin studies were consistent with anemia of chronic disease. His ESR was elevated at 100, erythropoietin level results are still pending. Head found to have low erythropoietin level, he will benefit with erythropoietin stimulating factor and iron supplement.  Discharge Vitals:   BP 140/77 (BP Location: Right Arm)   Pulse (!) 125   Temp 99.1 F (37.3 C) (Oral)   Resp 18   SpO2 96%   Pertinent Labs, Studies, and Procedures:   CBC Latest Ref Rng & Units 01/02/2016 01/01/2016 12/31/2015  WBC 4.0 - 10.5 K/uL 5.2 5.4 6.5  Hemoglobin 13.0 - 17.0 g/dL 9.1(L) 9.9(L) 10.1(L)  Hematocrit 39.0 - 52.0 % 29.1(L) 30.8(L) 31.2(L)  Platelets 150 - 400 K/uL 340 352 347   BMP Latest Ref Rng & Units 01/02/2016 01/01/2016 12/31/2015  Glucose 65 - 99 mg/dL 113(H) - 192(H)  BUN 6 - 20 mg/dL 6 - 8  Creatinine 0.61 - 1.24 mg/dL 1.04 0.79 0.99  Sodium 135 - 145 mmol/L 138 - 133(L)  Potassium 3.5 - 5.1 mmol/L 3.8 - 3.7   Chloride 101 - 111 mmol/L 102 - 97(L)  CO2 22 - 32 mmol/L 30 - 24  Calcium 8.9 - 10.3 mg/dL 8.3(L) - 8.8(L)   Iron/TIBC/Ferritin/ %Sat    Component Value Date/Time   IRON 24 (L) 01/01/2016 1027   TIBC 182 (L) 01/01/2016 1027   FERRITIN 478 (H) 01/01/2016 1027   IRONPCTSAT 13 (L) 01/01/2016 1027   ESR. 100 A1c. 11.5  Discharge Instructions: Discharge Instructions    Diet - low sodium heart healthy    Complete by:  As directed    Discharge instructions    Complete by:  As directed    It was pleasure taking care of you. Please make an appointment with your primary care physician, to discuss about your low hemoglobin and results of your iron studies and erythropoietin level. Your erythropoietin level results are still pending, your primary care physician will be able to see them. Please go to your orthopedic appointment in 2 weeks. You are given a good supply of pain medicine, try using ibuprofen first, if that does not control your pain than take Percocet as directed.   Increase activity slowly    Complete by:  As directed       Signed: Lorella Nimrod, MD 01/03/2016, 1:46 PM   Pager: [phone]

## 2016-01-03 NOTE — Progress Notes (Signed)
   Subjective: Patient was feeling better when seen this morning, his pain is controlled with oxycodone. He did not required any IV Dilaudid or morphine over the last 24 hour. He was concerned about equipments for example wheelchair and bedside commode before discharge.  Objective:  Vital signs in last 24 hours: Vitals:   01/02/16 2040 01/03/16 0003 01/03/16 0424 01/03/16 0845  BP: (!) 149/83 135/70 136/71 (!) 152/81  Pulse: (!) 122 (!) 107 (!) 106   Resp:      Temp: 100.2 F (37.9 C) 98.8 F (37.1 C) 98.6 F (37 C)   TempSrc: Oral Oral Oral   SpO2: 94% 92% 95%    Gen. Well-developed, well-nourished, obese man, lying comfortably in his bed, in no acute distress. Chest. Clear bilaterally. CVS. Regular rate and rhythm, no rub/murmur/gallop. Abdomen. Soft, obese, nondistended, nontender, bowel sounds positive. Extremities. Clear Ace bandage on left BKA stem. Pulses intact on right lower extremity, there was no edema or cyanosis at right lower extremity.  Assessment/Plan:  40 yo male with DM type I with b/l charcot foot is here with nonhealing diabetic ulcer on left with possible osteomyelitis, now S/P left BKA.  Left lateral hindfoot non healing diabetic ulcer with possible osteomyelitis, s/p Left BKA. Postop day 2. Ace bandage was clean without any blood or discharge.  pain was controlled with oxycodone. He is requiring oxycodone every 4-6 hour. He is discharged from orthopedic standpoint. He will follow-up withDr. Doran Durand in 2 weeks. He does not required any more antibiotics.  Type I DM - uncontrolled, hgba1c 11.5. CBG between 145-184 over last 24 hour - He was on Lantus 20 units and SSI. In the hospital. -He will be discharged home on his home dose of Lantus and Humalog.  HTN. Blood pressure well controlled.  - cont home meds hctz '25mg'$  + losartan '50mg'$    HLD. Continue statin  Normocytic anemia - b/l hgb ~13 His postop hemoglobin today was 9.1. Iron studies shows high  ferritin and low iron stores, consistent with anemia of chronic disease. His ESR is high at 100 and erythropoietin results are still pending.If her erythropoietin levels are low, he might benefit from erythropoietin stimulating drug and iron supplement.  Dispo: Being discharged today.   Lorella Nimrod, MD 01/03/2016, 11:47 AM Pager: [phone]

## 2016-01-03 NOTE — Progress Notes (Signed)
Occupational Therapy Treatment Patient Details Name: Michael Hutchinson MRN: [id number] DOB: 03-Jan-1976 Today's Date: 01/03/2016    History of present illness 40 yo male admitted on 12/31/15 with left charcot foot, ankle diabetic ulcer, and ankle osteomyelitis who underwent a left BKA. PMH significant for DM1, HTN, GERD.   OT comments  OT answered pts questions and ordered DME.  Pt very motivated and in good spirits  Follow Up Recommendations  Home health OT;Supervision/Assistance - 24 hour    Equipment Recommendations  3 in 1 bedside commode;Tub/shower bench           Mobility Bed Mobility               General bed mobility comments: pt in chair  Transfers Overall transfer level: Needs assistance Equipment used: Rolling walker (2 wheeled) Transfers: Sit to/from UGI CorporationStand;Stand Pivot Transfers Sit to Stand: Min guard Stand pivot transfers: Min guard                ADL Overall ADL's : Needs assistance/impaired             Lower Body Bathing: Moderate assistance;Sit to/from stand;Cueing for safety;Cueing for sequencing   Upper Body Dressing : Set up;Sitting   Lower Body Dressing: Moderate assistance;Sit to/from stand;Cueing for safety;Cueing for sequencing   Toilet Transfer: Minimal assistance;RW;Cueing for sequencing;Cueing for safety Toilet Transfer Details (indicate cue type and reason): sit to stand for urinal Toileting- Clothing Manipulation and Hygiene: Minimal assistance;Sit to/from stand;Cueing for safety;Cueing for sequencing     Tub/Shower Transfer Details (indicate cue type and reason): educated pt and wife on tub bench and will order.  pt and wife verbalized understanding   General ADL Comments: educated pt and wife on grab bar options for bathroom                Cognition   Behavior During Therapy: WFL for tasks assessed/performed Overall Cognitive Status: Within Functional Limits for tasks assessed                               General Comments      Pertinent Vitals/ Pain       Pain Score: 4  Pain Location: left Le posterior and knee Pain Descriptors / Indicators: Aching Pain Intervention(s): Monitored during session;Repositioned         Frequency  Min 2X/week        Progress Toward Goals  OT Goals(current goals can now be found in the care plan section)  Progress towards OT goals: Progressing toward goals  Acute Rehab OT Goals Patient Stated Goal: to get home and back to work OT Goal Formulation: With patient  Plan Discharge plan remains appropriate       End of Session Equipment Utilized During Treatment: Gait belt;Rolling walker   Activity Tolerance Patient tolerated treatment well   Patient Left in chair;with call bell/phone within reach;with family/visitor present   Nurse Communication Mobility status (Pt itching and requests info concerning support groups)        Time: [id number] OT Time Calculation (min): 33 min  Charges: OT General Charges $OT Visit: 1 Procedure OT Treatments $Self Care/Home Management : 23-37 mins  Derrien Anschutz, Metro KungLorraine D 01/03/2016, 9:50 AM

## 2016-01-04 ENCOUNTER — Encounter (HOSPITAL_COMMUNITY): Payer: Self-pay | Admitting: Orthopedic Surgery

## 2016-01-05 LAB — ERYTHROPOIETIN: Erythropoietin: 75.9 m[IU]/mL — ABNORMAL HIGH (ref 2.6–18.5)

## 2016-01-06 LAB — GLUCOSE, CAPILLARY: Glucose-Capillary: 122 mg/dL — ABNORMAL HIGH (ref 65–99)

## 2016-01-07 DIAGNOSIS — Z4781 Encounter for orthopedic aftercare following surgical amputation: Secondary | ICD-10-CM | POA: Diagnosis not present

## 2016-01-07 DIAGNOSIS — Z89512 Acquired absence of left leg below knee: Secondary | ICD-10-CM | POA: Diagnosis not present

## 2016-01-07 DIAGNOSIS — I10 Essential (primary) hypertension: Secondary | ICD-10-CM | POA: Diagnosis not present

## 2016-01-07 DIAGNOSIS — E10610 Type 1 diabetes mellitus with diabetic neuropathic arthropathy: Secondary | ICD-10-CM | POA: Diagnosis not present

## 2016-01-08 DIAGNOSIS — Z4781 Encounter for orthopedic aftercare following surgical amputation: Secondary | ICD-10-CM | POA: Diagnosis not present

## 2016-01-08 DIAGNOSIS — I10 Essential (primary) hypertension: Secondary | ICD-10-CM | POA: Diagnosis not present

## 2016-01-08 DIAGNOSIS — Z89512 Acquired absence of left leg below knee: Secondary | ICD-10-CM | POA: Diagnosis not present

## 2016-01-08 DIAGNOSIS — E10610 Type 1 diabetes mellitus with diabetic neuropathic arthropathy: Secondary | ICD-10-CM | POA: Diagnosis not present

## 2016-01-12 DIAGNOSIS — I10 Essential (primary) hypertension: Secondary | ICD-10-CM | POA: Diagnosis not present

## 2016-01-12 DIAGNOSIS — Z4781 Encounter for orthopedic aftercare following surgical amputation: Secondary | ICD-10-CM | POA: Diagnosis not present

## 2016-01-12 DIAGNOSIS — E10610 Type 1 diabetes mellitus with diabetic neuropathic arthropathy: Secondary | ICD-10-CM | POA: Diagnosis not present

## 2016-01-12 DIAGNOSIS — Z89512 Acquired absence of left leg below knee: Secondary | ICD-10-CM | POA: Diagnosis not present

## 2016-01-13 DIAGNOSIS — E10610 Type 1 diabetes mellitus with diabetic neuropathic arthropathy: Secondary | ICD-10-CM | POA: Diagnosis not present

## 2016-01-13 DIAGNOSIS — Z4781 Encounter for orthopedic aftercare following surgical amputation: Secondary | ICD-10-CM | POA: Diagnosis not present

## 2016-01-13 DIAGNOSIS — Z89512 Acquired absence of left leg below knee: Secondary | ICD-10-CM | POA: Diagnosis not present

## 2016-01-13 DIAGNOSIS — I10 Essential (primary) hypertension: Secondary | ICD-10-CM | POA: Diagnosis not present

## 2016-01-14 DIAGNOSIS — Z4781 Encounter for orthopedic aftercare following surgical amputation: Secondary | ICD-10-CM | POA: Diagnosis not present

## 2016-01-14 DIAGNOSIS — I10 Essential (primary) hypertension: Secondary | ICD-10-CM | POA: Diagnosis not present

## 2016-01-14 DIAGNOSIS — E10610 Type 1 diabetes mellitus with diabetic neuropathic arthropathy: Secondary | ICD-10-CM | POA: Diagnosis not present

## 2016-01-14 DIAGNOSIS — Z89512 Acquired absence of left leg below knee: Secondary | ICD-10-CM | POA: Diagnosis not present

## 2016-01-19 DIAGNOSIS — Z89512 Acquired absence of left leg below knee: Secondary | ICD-10-CM | POA: Diagnosis not present

## 2016-01-19 DIAGNOSIS — Z4781 Encounter for orthopedic aftercare following surgical amputation: Secondary | ICD-10-CM | POA: Diagnosis not present

## 2016-01-19 DIAGNOSIS — I10 Essential (primary) hypertension: Secondary | ICD-10-CM | POA: Diagnosis not present

## 2016-01-19 DIAGNOSIS — E10610 Type 1 diabetes mellitus with diabetic neuropathic arthropathy: Secondary | ICD-10-CM | POA: Diagnosis not present

## 2016-01-20 DIAGNOSIS — I10 Essential (primary) hypertension: Secondary | ICD-10-CM | POA: Diagnosis not present

## 2016-01-20 DIAGNOSIS — Z4781 Encounter for orthopedic aftercare following surgical amputation: Secondary | ICD-10-CM | POA: Diagnosis not present

## 2016-01-20 DIAGNOSIS — E10610 Type 1 diabetes mellitus with diabetic neuropathic arthropathy: Secondary | ICD-10-CM | POA: Diagnosis not present

## 2016-01-20 DIAGNOSIS — Z89512 Acquired absence of left leg below knee: Secondary | ICD-10-CM | POA: Diagnosis not present

## 2016-01-21 DIAGNOSIS — I10 Essential (primary) hypertension: Secondary | ICD-10-CM | POA: Diagnosis not present

## 2016-01-21 DIAGNOSIS — E10610 Type 1 diabetes mellitus with diabetic neuropathic arthropathy: Secondary | ICD-10-CM | POA: Diagnosis not present

## 2016-01-21 DIAGNOSIS — Z89512 Acquired absence of left leg below knee: Secondary | ICD-10-CM | POA: Diagnosis not present

## 2016-01-21 DIAGNOSIS — Z4781 Encounter for orthopedic aftercare following surgical amputation: Secondary | ICD-10-CM | POA: Diagnosis not present

## 2016-01-22 DIAGNOSIS — I10 Essential (primary) hypertension: Secondary | ICD-10-CM | POA: Diagnosis not present

## 2016-01-22 DIAGNOSIS — Z89512 Acquired absence of left leg below knee: Secondary | ICD-10-CM | POA: Diagnosis not present

## 2016-01-22 DIAGNOSIS — Z4781 Encounter for orthopedic aftercare following surgical amputation: Secondary | ICD-10-CM | POA: Diagnosis not present

## 2016-01-22 DIAGNOSIS — E10610 Type 1 diabetes mellitus with diabetic neuropathic arthropathy: Secondary | ICD-10-CM | POA: Diagnosis not present

## 2016-01-29 DIAGNOSIS — Z89512 Acquired absence of left leg below knee: Secondary | ICD-10-CM | POA: Diagnosis not present

## 2016-01-29 DIAGNOSIS — Z4781 Encounter for orthopedic aftercare following surgical amputation: Secondary | ICD-10-CM | POA: Diagnosis not present

## 2016-01-29 DIAGNOSIS — E10610 Type 1 diabetes mellitus with diabetic neuropathic arthropathy: Secondary | ICD-10-CM | POA: Diagnosis not present

## 2016-01-29 DIAGNOSIS — I10 Essential (primary) hypertension: Secondary | ICD-10-CM | POA: Diagnosis not present

## 2016-02-02 DIAGNOSIS — Z7982 Long term (current) use of aspirin: Secondary | ICD-10-CM | POA: Diagnosis not present

## 2016-02-02 DIAGNOSIS — E10610 Type 1 diabetes mellitus with diabetic neuropathic arthropathy: Secondary | ICD-10-CM | POA: Diagnosis not present

## 2016-02-02 DIAGNOSIS — E785 Hyperlipidemia, unspecified: Secondary | ICD-10-CM | POA: Diagnosis not present

## 2016-02-02 DIAGNOSIS — Z791 Long term (current) use of non-steroidal anti-inflammatories (NSAID): Secondary | ICD-10-CM | POA: Diagnosis not present

## 2016-02-02 DIAGNOSIS — I10 Essential (primary) hypertension: Secondary | ICD-10-CM | POA: Diagnosis not present

## 2016-02-02 DIAGNOSIS — Z89512 Acquired absence of left leg below knee: Secondary | ICD-10-CM | POA: Diagnosis not present

## 2016-02-02 DIAGNOSIS — D649 Anemia, unspecified: Secondary | ICD-10-CM | POA: Diagnosis not present

## 2016-02-02 DIAGNOSIS — Z4781 Encounter for orthopedic aftercare following surgical amputation: Secondary | ICD-10-CM | POA: Diagnosis not present

## 2016-02-02 DIAGNOSIS — Z79891 Long term (current) use of opiate analgesic: Secondary | ICD-10-CM | POA: Diagnosis not present

## 2016-02-04 ENCOUNTER — Telehealth: Payer: Self-pay | Admitting: Family Medicine

## 2016-02-04 DIAGNOSIS — Z79891 Long term (current) use of opiate analgesic: Secondary | ICD-10-CM | POA: Diagnosis not present

## 2016-02-04 DIAGNOSIS — E10610 Type 1 diabetes mellitus with diabetic neuropathic arthropathy: Secondary | ICD-10-CM | POA: Diagnosis not present

## 2016-02-04 DIAGNOSIS — E785 Hyperlipidemia, unspecified: Secondary | ICD-10-CM | POA: Diagnosis not present

## 2016-02-04 DIAGNOSIS — D649 Anemia, unspecified: Secondary | ICD-10-CM | POA: Diagnosis not present

## 2016-02-04 DIAGNOSIS — Z89512 Acquired absence of left leg below knee: Secondary | ICD-10-CM | POA: Diagnosis not present

## 2016-02-04 DIAGNOSIS — I10 Essential (primary) hypertension: Secondary | ICD-10-CM | POA: Diagnosis not present

## 2016-02-04 DIAGNOSIS — Z7982 Long term (current) use of aspirin: Secondary | ICD-10-CM | POA: Diagnosis not present

## 2016-02-04 DIAGNOSIS — Z4781 Encounter for orthopedic aftercare following surgical amputation: Secondary | ICD-10-CM | POA: Diagnosis not present

## 2016-02-04 DIAGNOSIS — Z791 Long term (current) use of non-steroidal anti-inflammatories (NSAID): Secondary | ICD-10-CM | POA: Diagnosis not present

## 2016-02-04 MED ORDER — OSELTAMIVIR PHOSPHATE 75 MG PO CAPS: 75.0000 mg | ORAL_CAPSULE | Freq: Two times a day (BID) | ORAL | 0 refills | Status: DC

## 2016-02-04 NOTE — Addendum Note (Signed)
Addended by: Stan Head on: 02/04/2016 04:42 PM   Modules accepted: Orders

## 2016-02-04 NOTE — Telephone Encounter (Signed)
tamiflu for both Mom and Dad is fine to give them . thanks

## 2016-02-04 NOTE — Telephone Encounter (Signed)
Pt called states pediatrician just Dx their daughter w/ (Type A Flu strain ) advised them to get Tamaflu med-- pls call Rx into [facility] per request-- °Thanks Gail ° °

## 2016-02-04 NOTE — Telephone Encounter (Signed)
Pt denies fever, chills, myalgias.  Pt states that he does have a slight cough without sputum production and clear nasal drainage.    Pt is Type 1 diabetic with recent amputation of LLE.  Please advise.  Tiajuana Amass, CMA

## 2016-02-18 DIAGNOSIS — D649 Anemia, unspecified: Secondary | ICD-10-CM | POA: Diagnosis not present

## 2016-02-18 DIAGNOSIS — E785 Hyperlipidemia, unspecified: Secondary | ICD-10-CM | POA: Diagnosis not present

## 2016-02-18 DIAGNOSIS — E10610 Type 1 diabetes mellitus with diabetic neuropathic arthropathy: Secondary | ICD-10-CM | POA: Diagnosis not present

## 2016-02-18 DIAGNOSIS — Z89512 Acquired absence of left leg below knee: Secondary | ICD-10-CM | POA: Diagnosis not present

## 2016-02-18 DIAGNOSIS — Z4781 Encounter for orthopedic aftercare following surgical amputation: Secondary | ICD-10-CM | POA: Diagnosis not present

## 2016-02-18 DIAGNOSIS — Z79891 Long term (current) use of opiate analgesic: Secondary | ICD-10-CM | POA: Diagnosis not present

## 2016-02-18 DIAGNOSIS — Z791 Long term (current) use of non-steroidal anti-inflammatories (NSAID): Secondary | ICD-10-CM | POA: Diagnosis not present

## 2016-02-18 DIAGNOSIS — Z7982 Long term (current) use of aspirin: Secondary | ICD-10-CM | POA: Diagnosis not present

## 2016-02-18 DIAGNOSIS — I10 Essential (primary) hypertension: Secondary | ICD-10-CM | POA: Diagnosis not present

## 2016-04-01 DIAGNOSIS — Z89512 Acquired absence of left leg below knee: Secondary | ICD-10-CM | POA: Diagnosis not present

## 2016-04-11 DIAGNOSIS — Z89512 Acquired absence of left leg below knee: Secondary | ICD-10-CM | POA: Diagnosis not present

## 2016-04-20 ENCOUNTER — Encounter: Payer: Self-pay | Admitting: Physical Therapy

## 2016-04-20 ENCOUNTER — Ambulatory Visit: Payer: BLUE CROSS/BLUE SHIELD | Attending: Orthopedic Surgery | Admitting: Physical Therapy

## 2016-04-20 DIAGNOSIS — Z89512 Acquired absence of left leg below knee: Secondary | ICD-10-CM | POA: Diagnosis not present

## 2016-04-20 DIAGNOSIS — M6281 Muscle weakness (generalized): Secondary | ICD-10-CM | POA: Diagnosis not present

## 2016-04-20 DIAGNOSIS — IMO0002 Reserved for concepts with insufficient information to code with codable children: Secondary | ICD-10-CM

## 2016-04-20 DIAGNOSIS — R2681 Unsteadiness on feet: Secondary | ICD-10-CM | POA: Diagnosis not present

## 2016-04-20 DIAGNOSIS — R2689 Other abnormalities of gait and mobility: Secondary | ICD-10-CM

## 2016-04-20 NOTE — Therapy (Signed)
[facility] [address] [place], [place], [address] Phone: [phone]   Fax:  [phone]  Physical Therapy Evaluation  Patient Details  Name: Michael Hutchinson MRN: [id number] Date of Birth: 1975/09/07 Referring Provider: Dr. Toni Arthurs  Encounter Date: 04/20/2016      PT End of Session - 04/20/16 1054    Visit Number 1   Number of Visits 21   Date for PT Re-Evaluation 07/01/16   Authorization Type [employer] 30 visit limit with 23 visits remaining   Authorization - Visit Number 1   Authorization - Number of Visits 23   PT Start Time 0845   PT Stop Time 0930   PT Time Calculation (min) 45 min   Equipment Utilized During Treatment Gait belt   Activity Tolerance Patient tolerated treatment well   Behavior During Therapy WFL for tasks assessed/performed      Past Medical History:  Diagnosis Date  . Charcot's joint of foot 11/25/2013  . Complication of anesthesia    "I wake up angry" (12/31/2015)  . Diabetes mellitus type 1 (HCC) dx'd 1981  . Diabetic ketoacidosis (HCC)   . Essential hypertension 05/03/2013  . GERD (gastroesophageal reflux disease)   . High cholesterol   . Hx MRSA infection    Inner thigh and under arm- healed areas  . Meniscus tear   . Shortness of breath    with exertion only    Past Surgical History:  Procedure Laterality Date  . AMPUTATION Left 01/01/2016   Procedure: AMPUTATION BELOW KNEE;  Surgeon: Toni Arthurs, MD;  Location: [facility] OR;  Service: Orthopedics;  Laterality: Left;  . FRACTURE SURGERY    . HERNIA REPAIR    . KNEE ARTHROSCOPY Left ~ 2010  . LAPAROSCOPIC CHOLECYSTECTOMY  2015  . METACARPOPHALANGEAL JOINT ARTHRODESIS Left 06/2012   Fracture left index finger intra-articular MCP joint/notes 06/30/2012  . OPEN REDUCTION INTERNAL FIXATION (ORIF) PROXIMAL PHALANX Left 06/30/2012   Procedure: OPEN REDUCTION INTERNAL FIXATION (ORIF) LEFT INDEX FINGER PROXIMAL PHALANX FRACTURE WITH  LIGAMENT REPAIR AS NECESSARY;  Surgeon: Dominica Severin, MD;  Location: [facility] OR;  Service: Orthopedics;  Laterality: Left;  . UMBILICAL HERNIA REPAIR  2015   "w/gallbladder OR"    There were no vitals filed for this visit.       Subjective Assessment - 04/20/16 0849    Subjective This 40yo male underwent a Left Transtibial Amputation on 01/01/2016 due to diabetic wound. He received his first prosthesis on 04/08/2016 and is dependent in use & care. Patient reports he is wearing the prosthesis approximately 15 hours/day with a one hour break at 5pm. Patient reports he has had one fall but did not sustain any injuries. Patient does not report any pain or noting any skin integrity issues. Patient is eager to return to [facility].    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 No/denies            [facility] PT Assessment - 04/20/16 0845      Assessment   Medical Diagnosis left transtibial amputation    Referring Provider Dr. Toni Arthurs   Onset Date/Surgical Date 04/08/16  prosthesis delivery   Hand Dominance Right   Prior Therapy [facility]     Precautions   Precautions Fall     Balance Screen   Has the patient fallen in the past 6 months Yes   How many times? 1  no injuries    Has the patient had a decrease in activity level because of a fear of falling?  No   Is the patient reluctant to leave their home because of a fear of falling?  No     Home Environment   Living Environment Private residence   Living Arrangements Spouse/significant other;Children  children ages 34 & 16   Available Help at Discharge Family   Type of Home House   Home Access Ramped  entrance;Stairs to enter  gravel and pavers to get in door    Entrance Stairs-Rails Right   Home Layout One level   Home Equipment Walker - 2 wheels;Wheelchair - manual;Crutches;Shower seat;Bedside commode   Additional Comments patient reports toys are scattered around the house from his children creating potential tripping hazards     Prior Function   Level of Independence Independent   Vocation Full time employment  part time as Engineer, 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 70 pounds) and Psychologist, counselling (requires lifting, pulling, pushing)   Leisure ride motor cycle, hunting, fishing      Cognition   Overall Cognitive Status Within Functional Limits for tasks assessed     Observation/Other Assessments   Skin Integrity no signs of skin integrity issues    Focus on Therapeutic Outcomes (FOTO)  36.56 Functional Status   Activities of Balance Confidence Scale (ABC Scale)  46.3%   Fear Avoidance Belief Questionnaire (FABQ)  26 (8)     Posture/Postural Control   Posture/Postural Control Postural limitations   Postural Limitations Rounded Shoulders;Forward head;Increased lumbar lordosis;Flexed trunk;Weight shift right     ROM / Strength   AROM / PROM / Strength AROM;Strength     AROM   Overall AROM  Within functional limits for tasks performed   Overall AROM Comments Gross BUE AROM within functional limits. Gross normal functional range in RLE and LLE (hip and knee)   AROM Assessment Site --   Right/Left Shoulder --     Strength   Overall Strength Within functional limits for tasks performed   Overall Strength Comments Demonstrates gross, normal strength in BUEs, RLE, and LLE (hip and knee)   Strength Assessment Site --   Right/Left Shoulder --     Transfers   Transfers Sit to Stand;Stand to Sit   Sit to Stand 5: Supervision;With upper extremity assist;With armrests;From chair/3-in-1  uses back of  legs against chair to stabilize   Stand to Sit 5: Supervision;With upper extremity assist;With armrests;To chair/3-in-1  uses back of legs to control descent     Ambulation/Gait   Ambulation/Gait Yes   Ambulation/Gait Assistance 3: Mod assist;5: Supervision  modA cane & SBA RW   Ambulation/Gait Assistance Details excessive UE weight bearing   Ambulation Distance (Feet) 50 Feet  14' with RW & 61' with cane   Assistive device Standard walker;Straight cane;Prosthesis  utilizing RW at home   Gait Pattern Trunk flexed;Decreased step length - right;Decreased weight shift to left;Abducted - left;Step-to pattern;Decreased arm swing - left;Decreased stance time - left;Decreased stride length;Decreased hip/knee flexion - left;Left hip hike;Right flexed knee in stance;Left flexed knee in stance;Antalgic;Lateral hip instability  toed out, externaly rotated RLE  Ambulation Surface Level;Indoor   Gait velocity 0.95 ft/s  1.77ft/s with RW; 0.59ft/s with cane   Gait Comments When ambulating with cane (instead of RW), pt demonstrates decreased step length on R and decreased L weight shift. Pt utilizes RW in the home.     Standardized Balance Assessment   Standardized Balance Assessment Berg Balance Test;Timed Up and Go Test     Berg Balance Test   Sit to Stand Able to stand using hands after several tries   Standing Unsupported Able to stand safely 2 minutes   Sitting with Back Unsupported but Feet Supported on Floor or Stool Able to sit safely and securely 2 minutes   Stand to Sit Sits independently, has uncontrolled descent   Transfers Able to transfer with verbal cueing and /or supervision  requires use of hands    Standing Unsupported with Eyes Closed Able to stand 10 seconds with supervision   Standing Ubsupported with Feet Together Needs help to attain position but able to stand for 30 seconds with feet together   From Standing, Reach Forward with Outstretched Arm Can reach forward >5 cm safely  (2")   From Standing Position, Pick up Object from Floor Unable to pick up shoe, but reaches 2-5 cm (1-2") from shoe and balances independently   From Standing Position, Turn to Look Behind Over each Shoulder Turn sideways only but maintains balance   Turn 360 Degrees Needs close supervision or verbal cueing   Standing Unsupported, Alternately Place Feet on Step/Stool Needs assistance to keep from falling or unable to try   Standing Unsupported, One Foot in Front Needs help to step but can hold 15 seconds   Standing on One Leg Tries to lift leg/unable to hold 3 seconds but remains standing independently   Total Score 26     Timed Up and Go Test   Normal TUG (seconds) 29.33  cane         Prosthetics Assessment - 04/20/16 0845      Prosthetics   Prosthetic Care Dependent with Skin check;Residual limb care;Ply sock cleaning;Correct ply sock adjustment;Proper wear schedule/adjustment;Proper weight-bearing schedule/adjustment;Prosthetic cleaning;Care of non-amputated limb   Donning prosthesis  Supervision   Doffing prosthesis  Modified independent (Device/Increase time)   Current prosthetic wear tolerance (days/week)  patient reports 9/12 days since delivery of prosthesis   Current prosthetic wear tolerance (#hours/day)  patient reports building up tolerance to approximately 15 hrs/day   Current prosthetic weight-bearing tolerance (hours/day)  patient tolerated standing 10 minutes with intermittent UE support with partial weight on prosthesis without complaint of pain or discomfort   Edema non-pitting    Residual limb condition  no signs of skin integrity issues, scar is mobile and healing well, no signs of heat rash   K code/activity level with prosthetic use  K3 full community with variable cadence                   [facility] Adult PT Treatment/Exercise - 04/20/16 0845      Prosthetics   Prosthetic Care Comments  PT recommended prosthesis wear all awake hours except 1 hr  midday  and drying limb/liner every 3-4 hours. Pt instructed in use of antiperspirant spray on residual limb, lotion over night and cleaning prosthesis.    Education Provided Skin check;Residual limb care;Prosthetic cleaning;Ply sock cleaning;Proper Donning;Proper wear schedule/adjustment;Correct ply sock adjustment;Care of non-amputated limb;Proper weight-bearing schedule/adjustment   Person(s) Educated Patient;Spouse   Education Method Explanation;Demonstration;Tactile cues   Education Method Verbalized understanding;Returned demonstration;Verbal  cues required   Donning Prosthesis Supervision                PT Education - 04/20/16 1052    Education provided Yes   Education Details prosthesis care and wear schedule, proper donning & doffing of prosthesis, prosthesis care, signs of skin breakdown and how to avoid skin integrity issues, increasing activity in the home wiht RW    Person(s) Educated Patient;Spouse   Methods Explanation;Demonstration;Tactile cues;Verbal cues   Comprehension Verbalized understanding;Returned demonstration          PT Short Term Goals - 04/20/16 1117      PT SHORT TERM GOAL #1   Title Patient wil verbalize understanding and adhere to proper prosthesis wear schedule and prosthesis care. (TARGET DATE: 05/27/2016)   Time 5   Period Weeks   Status New     PT SHORT TERM GOAL #2   Title Patient will verbalize understanding and demonstrate HEP activities and exercises. (TARGET DATE: 05/27/2016)   Time 5   Period Weeks   Status New     PT SHORT TERM GOAL #3   Title Patient will score >32/56 on Berg Balance to indicate a decrease in his risk of falling. (TARGET DATE: 05/27/2016)   Time 5   Period Weeks   Status New     PT SHORT TERM GOAL #4   Title Patient will demonstrate ability to ambulate on level, indoor surfaces for 250' with prosthesis and LRAD with supervision. (TARGET DATE: 05/27/2016)   Time 5   Period Weeks   Status New           PT Long  Term Goals - 04/20/16 1105      PT LONG TERM GOAL #1   Title Patient will verbalize and demonstrate understanding of his ongoing HEP program and fitness. (TARGET DATE: 07/01/2016)   Time 10   Period Weeks   Status New     PT LONG TERM GOAL #2   Title Patient will score >44/56 on the Berg Balance test to indicate a decrease in his risk of falling. (TARGET DATE: 07/01/2016)   Time 10   Period Weeks   Status New     PT LONG TERM GOAL #3   Title Patient will perform a TUG with prosthesis only in <13s to indicate a decrease in his risk of falling. (TARGET DATE: 07/01/2016)   Time 10   Period Weeks   Status New     PT LONG TERM GOAL #4   Title Patient will complete an Functional Gait Assessment with a score of >18 to indicate a decrease in his risk of falling. (TARGET DATE: 07/01/2016)   Time 10   Period Weeks   Status New     PT LONG TERM GOAL #5   Title Patient will ambulate 1000' on unlevel surfaces (outdoors, ramps, curbs, and grass) with LRAD and prosthesis. (TARGET DATE: 07/01/2016)   Time 10   Period Weeks   Status New     Additional Long Term Goals   Additional Long Term Goals Yes     PT LONG TERM GOAL #6   Title Patient performs functional task of lifting, pushing, pulling with prosthesis safely.  (Target Date: 07/01/2016)   Time 10   Period Weeks   Status New               Plan - 04/20/16 1057    Clinical Impression Statement Patient presents after a L transtibial amputation on 01/01/2016. He received his  prosthesis on 04/08/2016. Patient reports wearing the prosthesis approximately 15hrs/day with one break around 5pm with mild heat rash distally. Patient denies any pain with partial weight bearing on prosthesis. He reports one fall without an injury since his amputation. Patient currently utilizes RW in the home but has gait deviations indicating fall risk. 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 He seems to be highly  motivated to return to [facility] but needs skilled care to utilize prosthesis with high level activities. Berg Balance of 26/56 indicates high fall risk. Timed Up-Go with cane 29.33sec with minA indicates fall risk & dependency on gait. Even in light of a PMH significant for DM1, charcot foot, HTN, HLD, and this amputation, the patient's prognosis for return to functional activities is good with skilled PT to address mobility and balance deficits with new prosthesis.   Rehab Potential Good   Clinical Impairments Affecting Rehab Potential DM type 1, charcot foot, HTN, HLD   PT Frequency 2x / week   PT Duration Other (comment)  10 weeks   PT Treatment/Interventions ADLs/Self Care Home Management;DME Instruction;Gait training;Stair training;Functional mobility training;Therapeutic activities;Therapeutic exercise;Balance training;Prosthetic Training;Patient/family education;Neuromuscular re-education;Scar mobilization   PT Next Visit Plan review prosthesis education on care and wear schedule, begin balance and mobility prosthetic activities, HEP at sink for midline   Consulted and Agree with Plan of Care Patient;Family member/caregiver   Family Member Consulted Wife - Marchelle Folks       Patient will benefit from skilled therapeutic intervention in order to improve the following deficits and impairments:  Abnormal gait, Decreased activity tolerance, Decreased balance, Decreased knowledge of precautions, Decreased knowledge of use of DME, Decreased mobility, Difficulty walking, Decreased strength, Decreased skin integrity, Postural dysfunction, Pain  Visit Diagnosis: Below knee amputation status, left (HCC)  Other abnormalities of gait and mobility  Unsteadiness on feet  Muscle weakness (generalized)     Problem List Patient Active Problem List   Diagnosis Date Noted  . Normocytic anemia 01/01/2016  . Osteomyelitis (HCC) 12/31/2015  . 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 Ankle wound 12/31/2015  . Chronic osteomyelitis of left ankle with draining sinus (HCC)   . Type 1 diabetes, uncontrolled, with Charcot's joint of foot (HCC) 10/12/2015  . Low HDL (under 40) 09/22/2015  . Morbidly obese (HCC) 09/22/2015  . H/O noncompliance with medical treatment, presenting hazards to health 09/22/2015  . Hx MRSA infection 09/22/2015  . Vitamin D deficiency 09/22/2015  . Diabetes mellitus type 1 (HCC)   . Charcot's joint of foot 11/25/2013  . Essential hypertension 05/03/2013  . Hypercholesterolemia 07/26/2012  . Diabetic ketoacidosis (HCC)   . GERD (gastroesophageal reflux disease)   . Meniscus tear   . Epigastric pain 12/10/2011  . Vomiting 12/10/2011  . Problems influencing health status 11/21/2011    Vladimir Faster PT, DPT 04/20/2016, 10:36 PM  [facility] [address] [place], [place], [address] Phone: [phone]   Fax:  [phone]  Name: Michael Hutchinson MRN: [id number] Date of Birth: May 18, 1975

## 2016-04-25 DIAGNOSIS — L03116 Cellulitis of left lower limb: Secondary | ICD-10-CM | POA: Diagnosis not present

## 2016-04-25 DIAGNOSIS — Z89512 Acquired absence of left leg below knee: Secondary | ICD-10-CM | POA: Diagnosis not present

## 2016-04-26 ENCOUNTER — Inpatient Hospital Stay (HOSPITAL_COMMUNITY): Payer: BLUE CROSS/BLUE SHIELD | Admitting: Certified Registered"

## 2016-04-26 ENCOUNTER — Emergency Department (HOSPITAL_COMMUNITY): Payer: BLUE CROSS/BLUE SHIELD

## 2016-04-26 ENCOUNTER — Encounter (HOSPITAL_COMMUNITY)
Admission: EM | Disposition: A | Discharge status: Home Health | Payer: Self-pay | Source: Home / Self Care | Attending: Orthopedic Surgery

## 2016-04-26 ENCOUNTER — Ambulatory Visit: Payer: BLUE CROSS/BLUE SHIELD | Admitting: Physical Therapy

## 2016-04-26 ENCOUNTER — Inpatient Hospital Stay (HOSPITAL_COMMUNITY)
Admission: EM | Admit: 2016-04-26 | Discharge: 2016-04-29 | DRG: 580 | Disposition: A | Discharge status: Home Health | Payer: BLUE CROSS/BLUE SHIELD | Attending: Orthopedic Surgery | Admitting: Orthopedic Surgery

## 2016-04-26 ENCOUNTER — Encounter (HOSPITAL_COMMUNITY): Payer: Self-pay

## 2016-04-26 DIAGNOSIS — Z9049 Acquired absence of other specified parts of digestive tract: Secondary | ICD-10-CM | POA: Diagnosis not present

## 2016-04-26 DIAGNOSIS — S81002S Unspecified open wound, left knee, sequela: Secondary | ICD-10-CM | POA: Diagnosis not present

## 2016-04-26 DIAGNOSIS — Z8249 Family history of ischemic heart disease and other diseases of the circulatory system: Secondary | ICD-10-CM

## 2016-04-26 DIAGNOSIS — E784 Other hyperlipidemia: Secondary | ICD-10-CM | POA: Diagnosis not present

## 2016-04-26 DIAGNOSIS — E1042 Type 1 diabetes mellitus with diabetic polyneuropathy: Secondary | ICD-10-CM | POA: Diagnosis not present

## 2016-04-26 DIAGNOSIS — L0889 Other specified local infections of the skin and subcutaneous tissue: Secondary | ICD-10-CM

## 2016-04-26 DIAGNOSIS — Z89522 Acquired absence of left knee: Secondary | ICD-10-CM | POA: Diagnosis not present

## 2016-04-26 DIAGNOSIS — Z9119 Patient's noncompliance with other medical treatment and regimen: Secondary | ICD-10-CM | POA: Diagnosis not present

## 2016-04-26 DIAGNOSIS — E1040 Type 1 diabetes mellitus with diabetic neuropathy, unspecified: Secondary | ICD-10-CM

## 2016-04-26 DIAGNOSIS — Z808 Family history of malignant neoplasm of other organs or systems: Secondary | ICD-10-CM

## 2016-04-26 DIAGNOSIS — B9689 Other specified bacterial agents as the cause of diseases classified elsewhere: Secondary | ICD-10-CM

## 2016-04-26 DIAGNOSIS — K219 Gastro-esophageal reflux disease without esophagitis: Secondary | ICD-10-CM | POA: Diagnosis present

## 2016-04-26 DIAGNOSIS — E109 Type 1 diabetes mellitus without complications: Secondary | ICD-10-CM | POA: Diagnosis present

## 2016-04-26 DIAGNOSIS — B9562 Methicillin resistant Staphylococcus aureus infection as the cause of diseases classified elsewhere: Secondary | ICD-10-CM | POA: Diagnosis present

## 2016-04-26 DIAGNOSIS — Z823 Family history of stroke: Secondary | ICD-10-CM | POA: Diagnosis not present

## 2016-04-26 DIAGNOSIS — L03116 Cellulitis of left lower limb: Secondary | ICD-10-CM | POA: Diagnosis present

## 2016-04-26 DIAGNOSIS — E1159 Type 2 diabetes mellitus with other circulatory complications: Secondary | ICD-10-CM | POA: Diagnosis present

## 2016-04-26 DIAGNOSIS — X58XXXS Exposure to other specified factors, sequela: Secondary | ICD-10-CM | POA: Diagnosis not present

## 2016-04-26 DIAGNOSIS — Z5181 Encounter for therapeutic drug level monitoring: Secondary | ICD-10-CM | POA: Diagnosis not present

## 2016-04-26 DIAGNOSIS — L97424 Non-pressure chronic ulcer of left heel and midfoot with necrosis of bone: Secondary | ICD-10-CM | POA: Diagnosis not present

## 2016-04-26 DIAGNOSIS — M1460 Charcot's joint, unspecified site: Secondary | ICD-10-CM | POA: Diagnosis not present

## 2016-04-26 DIAGNOSIS — Z978 Presence of other specified devices: Secondary | ICD-10-CM | POA: Diagnosis not present

## 2016-04-26 DIAGNOSIS — E871 Hypo-osmolality and hyponatremia: Secondary | ICD-10-CM | POA: Diagnosis present

## 2016-04-26 DIAGNOSIS — Z794 Long term (current) use of insulin: Secondary | ICD-10-CM | POA: Diagnosis not present

## 2016-04-26 DIAGNOSIS — Z89512 Acquired absence of left leg below knee: Secondary | ICD-10-CM

## 2016-04-26 DIAGNOSIS — E118 Type 2 diabetes mellitus with unspecified complications: Secondary | ICD-10-CM | POA: Diagnosis not present

## 2016-04-26 DIAGNOSIS — L02416 Cutaneous abscess of left lower limb: Principal | ICD-10-CM

## 2016-04-26 DIAGNOSIS — Z792 Long term (current) use of antibiotics: Secondary | ICD-10-CM | POA: Diagnosis not present

## 2016-04-26 DIAGNOSIS — T8744 Infection of amputation stump, left lower extremity: Secondary | ICD-10-CM | POA: Diagnosis not present

## 2016-04-26 DIAGNOSIS — I10 Essential (primary) hypertension: Secondary | ICD-10-CM

## 2016-04-26 DIAGNOSIS — Z8614 Personal history of Methicillin resistant Staphylococcus aureus infection: Secondary | ICD-10-CM

## 2016-04-26 DIAGNOSIS — Z87891 Personal history of nicotine dependence: Secondary | ICD-10-CM | POA: Diagnosis not present

## 2016-04-26 DIAGNOSIS — M868X6 Other osteomyelitis, lower leg: Secondary | ICD-10-CM | POA: Diagnosis not present

## 2016-04-26 DIAGNOSIS — E1021 Type 1 diabetes mellitus with diabetic nephropathy: Secondary | ICD-10-CM | POA: Diagnosis not present

## 2016-04-26 DIAGNOSIS — R51 Headache: Secondary | ICD-10-CM | POA: Diagnosis present

## 2016-04-26 DIAGNOSIS — Z887 Allergy status to serum and vaccine status: Secondary | ICD-10-CM

## 2016-04-26 DIAGNOSIS — E7800 Pure hypercholesterolemia, unspecified: Secondary | ICD-10-CM | POA: Diagnosis not present

## 2016-04-26 DIAGNOSIS — Z833 Family history of diabetes mellitus: Secondary | ICD-10-CM

## 2016-04-26 DIAGNOSIS — E119 Type 2 diabetes mellitus without complications: Secondary | ICD-10-CM

## 2016-04-26 DIAGNOSIS — Z811 Family history of alcohol abuse and dependence: Secondary | ICD-10-CM

## 2016-04-26 DIAGNOSIS — E782 Mixed hyperlipidemia: Secondary | ICD-10-CM

## 2016-04-26 DIAGNOSIS — Z6841 Body Mass Index (BMI) 40.0 and over, adult: Secondary | ICD-10-CM

## 2016-04-26 DIAGNOSIS — H539 Unspecified visual disturbance: Secondary | ICD-10-CM | POA: Diagnosis not present

## 2016-04-26 DIAGNOSIS — E1069 Type 1 diabetes mellitus with other specified complication: Secondary | ICD-10-CM | POA: Diagnosis present

## 2016-04-26 DIAGNOSIS — B9561 Methicillin susceptible Staphylococcus aureus infection as the cause of diseases classified elsewhere: Secondary | ICD-10-CM | POA: Diagnosis not present

## 2016-04-26 DIAGNOSIS — E785 Hyperlipidemia, unspecified: Secondary | ICD-10-CM | POA: Diagnosis not present

## 2016-04-26 DIAGNOSIS — Y838 Other surgical procedures as the cause of abnormal reaction of the patient, or of later complication, without mention of misadventure at the time of the procedure: Secondary | ICD-10-CM | POA: Diagnosis not present

## 2016-04-26 DIAGNOSIS — M86172 Other acute osteomyelitis, left ankle and foot: Secondary | ICD-10-CM | POA: Diagnosis not present

## 2016-04-26 DIAGNOSIS — I152 Hypertension secondary to endocrine disorders: Secondary | ICD-10-CM | POA: Diagnosis present

## 2016-04-26 DIAGNOSIS — R1013 Epigastric pain: Secondary | ICD-10-CM | POA: Diagnosis not present

## 2016-04-26 DIAGNOSIS — Z8349 Family history of other endocrine, nutritional and metabolic diseases: Secondary | ICD-10-CM

## 2016-04-26 DIAGNOSIS — Z91199 Patient's noncompliance with other medical treatment and regimen due to unspecified reason: Secondary | ICD-10-CM

## 2016-04-26 DIAGNOSIS — Z95828 Presence of other vascular implants and grafts: Secondary | ICD-10-CM | POA: Diagnosis not present

## 2016-04-26 DIAGNOSIS — Z803 Family history of malignant neoplasm of breast: Secondary | ICD-10-CM

## 2016-04-26 DIAGNOSIS — M14671 Charcot's joint, right ankle and foot: Secondary | ICD-10-CM | POA: Diagnosis not present

## 2016-04-26 HISTORY — PX: APPLICATION OF WOUND VAC: SHX5189

## 2016-04-26 HISTORY — PX: INCISE AND DRAIN ABCESS: PRO64

## 2016-04-26 HISTORY — PX: I & D EXTREMITY: SHX5045

## 2016-04-26 LAB — C-REACTIVE PROTEIN: CRP: 20.7 mg/dL — AB (ref <1.0)

## 2016-04-26 LAB — COMPREHENSIVE METABOLIC PANEL
ALK PHOS: 94 U/L (ref 38–126)
ALT: 14 U/L — AB (ref 17–63)
ANION GAP: 9 (ref 5–15)
AST: 13 U/L — ABNORMAL LOW (ref 15–41)
Albumin: 3.2 g/dL — ABNORMAL LOW (ref 3.5–5.0)
BILIRUBIN TOTAL: 0.8 mg/dL (ref 0.3–1.2)
BUN: 13 mg/dL (ref 6–20)
CALCIUM: 8.8 mg/dL — AB (ref 8.9–10.3)
CO2: 25 mmol/L (ref 22–32)
CREATININE: 1.04 mg/dL (ref 0.61–1.24)
Chloride: 99 mmol/L — ABNORMAL LOW (ref 101–111)
GFR calc non Af Amer: >60 mL/min (ref >60)
Glucose, Bld: 250 mg/dL — ABNORMAL HIGH (ref 65–99)
Potassium: 4.2 mmol/L (ref 3.5–5.1)
SODIUM: 133 mmol/L — AB (ref 135–145)
TOTAL PROTEIN: 7.1 g/dL (ref 6.5–8.1)

## 2016-04-26 LAB — CBC WITH DIFFERENTIAL/PLATELET
BASOS PCT: 0 %
Basophils Absolute: 0 10*3/uL (ref 0.0–0.1)
EOS ABS: 0.2 10*3/uL (ref 0.0–0.7)
Eosinophils Relative: 2 %
HCT: 33.3 % — ABNORMAL LOW (ref 39.0–52.0)
HEMOGLOBIN: 11.3 g/dL — AB (ref 13.0–17.0)
Lymphocytes Relative: 11 %
Lymphs Abs: 0.9 10*3/uL (ref 0.7–4.0)
MCH: 29 pg (ref 26.0–34.0)
MCHC: 33.9 g/dL (ref 30.0–36.0)
MCV: 85.4 fL (ref 78.0–100.0)
MONOS PCT: 9 %
Monocytes Absolute: 0.7 10*3/uL (ref 0.1–1.0)
NEUTROS ABS: 6.4 10*3/uL (ref 1.7–7.7)
NEUTROS PCT: 78 %
Platelets: 292 10*3/uL (ref 150–400)
RBC: 3.9 MIL/uL — ABNORMAL LOW (ref 4.22–5.81)
RDW: 12.9 % (ref 11.5–15.5)
WBC: 8.1 10*3/uL (ref 4.0–10.5)

## 2016-04-26 LAB — GLUCOSE, CAPILLARY
GLUCOSE-CAPILLARY: 205 mg/dL — AB (ref 65–99)
GLUCOSE-CAPILLARY: 205 mg/dL — AB (ref 65–99)
GLUCOSE-CAPILLARY: 221 mg/dL — AB (ref 65–99)
GLUCOSE-CAPILLARY: 227 mg/dL — AB (ref 65–99)

## 2016-04-26 LAB — SEDIMENTATION RATE: Sed Rate: 114 mm/hr — ABNORMAL HIGH (ref 0–16)

## 2016-04-26 LAB — I-STAT CG4 LACTIC ACID, ED
LACTIC ACID, VENOUS: 0.95 mmol/L (ref 0.5–1.9)
Lactic Acid, Venous: 1.56 mmol/L (ref 0.5–1.9)

## 2016-04-26 SURGERY — IRRIGATION AND DEBRIDEMENT EXTREMITY
Anesthesia: General | Site: Leg Lower | Laterality: Left

## 2016-04-26 MED ORDER — INSULIN ASPART 100 UNIT/ML ~~LOC~~ SOLN
0.0000 [IU] | Freq: Every day | SUBCUTANEOUS | Status: DC
  Administered 2016-04-26: 3 [IU] via SUBCUTANEOUS

## 2016-04-26 MED ORDER — MIDAZOLAM HCL 5 MG/5ML IJ SOLN
INTRAMUSCULAR | Status: DC | PRN
  Administered 2016-04-26: 2 mg via INTRAVENOUS

## 2016-04-26 MED ORDER — LIDOCAINE HCL (CARDIAC) 20 MG/ML IV SOLN
INTRAVENOUS | Status: DC | PRN
  Administered 2016-04-26: 60 mg via INTRAVENOUS

## 2016-04-26 MED ORDER — LABETALOL HCL 5 MG/ML IV SOLN
INTRAVENOUS | Status: DC | PRN
  Administered 2016-04-26: 2.5 mg via INTRAVENOUS

## 2016-04-26 MED ORDER — ACETAMINOPHEN 650 MG RE SUPP: 650.0000 mg | Freq: Four times a day (QID) | RECTAL | Status: DC | PRN

## 2016-04-26 MED ORDER — SUCCINYLCHOLINE CHLORIDE 200 MG/10ML IV SOSY
PREFILLED_SYRINGE | INTRAVENOUS | Status: AC
  Filled 2016-04-26: qty 10

## 2016-04-26 MED ORDER — DEXTROSE 5 % IV SOLN
1.0000 g | Freq: Three times a day (TID) | INTRAVENOUS | Status: DC
  Administered 2016-04-26 – 2016-04-27 (×3): 1 g via INTRAVENOUS
  Filled 2016-04-26 (×4): qty 1

## 2016-04-26 MED ORDER — INSULIN ASPART 100 UNIT/ML ~~LOC~~ SOLN
0.0000 [IU] | Freq: Three times a day (TID) | SUBCUTANEOUS | Status: DC
  Administered 2016-04-26: 3 [IU] via SUBCUTANEOUS
  Administered 2016-04-27: 2 [IU] via SUBCUTANEOUS
  Administered 2016-04-27 – 2016-04-28 (×3): 1 [IU] via SUBCUTANEOUS
  Administered 2016-04-28: 5 [IU] via SUBCUTANEOUS
  Administered 2016-04-29: 2 [IU] via SUBCUTANEOUS

## 2016-04-26 MED ORDER — VANCOMYCIN HCL 10 G IV SOLR
2000.0000 mg | Freq: Once | INTRAVENOUS | Status: AC
  Administered 2016-04-26: 2000 mg via INTRAVENOUS
  Filled 2016-04-26: qty 2000

## 2016-04-26 MED ORDER — ONDANSETRON HCL 4 MG/2ML IJ SOLN
INTRAMUSCULAR | Status: DC | PRN
  Administered 2016-04-26: 4 mg via INTRAVENOUS

## 2016-04-26 MED ORDER — OXYCODONE HCL 5 MG/5ML PO SOLN: 5.0000 mg | Freq: Once | ORAL | Status: DC | PRN

## 2016-04-26 MED ORDER — PROPOFOL 10 MG/ML IV BOLUS
INTRAVENOUS | Status: AC
  Filled 2016-04-26: qty 20

## 2016-04-26 MED ORDER — 0.9 % SODIUM CHLORIDE (POUR BTL) OPTIME
TOPICAL | Status: DC | PRN
  Administered 2016-04-26: 1000 mL

## 2016-04-26 MED ORDER — LOSARTAN POTASSIUM 50 MG PO TABS
50.0000 mg | ORAL_TABLET | Freq: Every day | ORAL | Status: DC
  Administered 2016-04-26 – 2016-04-28 (×3): 50 mg via ORAL
  Filled 2016-04-26 (×3): qty 1

## 2016-04-26 MED ORDER — FENTANYL CITRATE (PF) 100 MCG/2ML IJ SOLN: 25.0000 ug | INTRAMUSCULAR | Status: DC | PRN

## 2016-04-26 MED ORDER — OXYCODONE HCL 5 MG PO TABS
5.0000 mg | ORAL_TABLET | ORAL | Status: DC | PRN
  Administered 2016-04-26 – 2016-04-28 (×8): 10 mg via ORAL
  Filled 2016-04-26 (×9): qty 2

## 2016-04-26 MED ORDER — ROCURONIUM BROMIDE 50 MG/5ML IV SOSY
PREFILLED_SYRINGE | INTRAVENOUS | Status: AC
  Filled 2016-04-26: qty 10

## 2016-04-26 MED ORDER — INSULIN GLARGINE 100 UNIT/ML ~~LOC~~ SOLN
40.0000 [IU] | Freq: Every day | SUBCUTANEOUS | Status: DC
  Administered 2016-04-26 – 2016-04-28 (×3): 40 [IU] via SUBCUTANEOUS
  Filled 2016-04-26 (×3): qty 0.4

## 2016-04-26 MED ORDER — DOCUSATE SODIUM 100 MG PO CAPS
100.0000 mg | ORAL_CAPSULE | Freq: Two times a day (BID) | ORAL | Status: DC
  Administered 2016-04-27: 100 mg via ORAL
  Filled 2016-04-26 (×4): qty 1

## 2016-04-26 MED ORDER — FENTANYL CITRATE (PF) 250 MCG/5ML IJ SOLN
INTRAMUSCULAR | Status: AC
  Filled 2016-04-26: qty 5

## 2016-04-26 MED ORDER — OXYCODONE HCL 5 MG PO TABS: 5.0000 mg | ORAL_TABLET | Freq: Once | ORAL | Status: DC | PRN

## 2016-04-26 MED ORDER — HYDROCHLOROTHIAZIDE 25 MG PO TABS
25.0000 mg | ORAL_TABLET | Freq: Every day | ORAL | Status: DC
  Administered 2016-04-26 – 2016-04-29 (×4): 25 mg via ORAL
  Filled 2016-04-26 (×4): qty 1

## 2016-04-26 MED ORDER — DIPHENHYDRAMINE HCL 12.5 MG/5ML PO ELIX: 12.5000 mg | ORAL_SOLUTION | ORAL | Status: DC | PRN

## 2016-04-26 MED ORDER — ATORVASTATIN CALCIUM 40 MG PO TABS
40.0000 mg | ORAL_TABLET | Freq: Every day | ORAL | Status: DC
  Administered 2016-04-26 – 2016-04-28 (×3): 40 mg via ORAL
  Filled 2016-04-26 (×3): qty 1

## 2016-04-26 MED ORDER — DEXTROSE 5 % IV SOLN
2.0000 g | Freq: Once | INTRAVENOUS | Status: AC
  Administered 2016-04-26: 2 g via INTRAVENOUS
  Filled 2016-04-26: qty 2

## 2016-04-26 MED ORDER — MORPHINE SULFATE (PF) 4 MG/ML IV SOLN
4.0000 mg | INTRAVENOUS | Status: DC | PRN
  Administered 2016-04-27: 4 mg via INTRAVENOUS
  Filled 2016-04-26: qty 1

## 2016-04-26 MED ORDER — ONDANSETRON HCL 4 MG/2ML IJ SOLN
4.0000 mg | Freq: Four times a day (QID) | INTRAMUSCULAR | Status: DC | PRN
  Administered 2016-04-27: 4 mg via INTRAVENOUS
  Filled 2016-04-26: qty 2

## 2016-04-26 MED ORDER — ONDANSETRON HCL 4 MG/2ML IJ SOLN
INTRAMUSCULAR | Status: AC
  Filled 2016-04-26: qty 6

## 2016-04-26 MED ORDER — SODIUM CHLORIDE 0.9 % IR SOLN
Status: DC | PRN
  Administered 2016-04-26: 3000 mL

## 2016-04-26 MED ORDER — METOCLOPRAMIDE HCL 5 MG PO TABS: 5.0000 mg | ORAL_TABLET | Freq: Three times a day (TID) | ORAL | Status: DC | PRN

## 2016-04-26 MED ORDER — SODIUM CHLORIDE 0.9 % IV BOLUS (SEPSIS)
1000.0000 mL | Freq: Once | INTRAVENOUS | Status: AC
  Administered 2016-04-26: 1000 mL via INTRAVENOUS

## 2016-04-26 MED ORDER — VANCOMYCIN HCL 10 G IV SOLR
1250.0000 mg | Freq: Three times a day (TID) | INTRAVENOUS | Status: DC
  Administered 2016-04-26 – 2016-04-28 (×6): 1250 mg via INTRAVENOUS
  Filled 2016-04-26 (×8): qty 1250

## 2016-04-26 MED ORDER — EPHEDRINE 5 MG/ML INJ
INTRAVENOUS | Status: AC
  Filled 2016-04-26: qty 10

## 2016-04-26 MED ORDER — FENTANYL CITRATE (PF) 100 MCG/2ML IJ SOLN
INTRAMUSCULAR | Status: DC | PRN
  Administered 2016-04-26 (×3): 50 ug via INTRAVENOUS
  Administered 2016-04-26: 100 ug via INTRAVENOUS

## 2016-04-26 MED ORDER — SENNA 8.6 MG PO TABS
1.0000 | ORAL_TABLET | Freq: Two times a day (BID) | ORAL | Status: DC
  Filled 2016-04-26 (×2): qty 1

## 2016-04-26 MED ORDER — PHENYLEPHRINE 40 MCG/ML (10ML) SYRINGE FOR IV PUSH (FOR BLOOD PRESSURE SUPPORT)
PREFILLED_SYRINGE | INTRAVENOUS | Status: AC
  Filled 2016-04-26: qty 30

## 2016-04-26 MED ORDER — SUCCINYLCHOLINE CHLORIDE 20 MG/ML IJ SOLN
INTRAMUSCULAR | Status: DC | PRN
  Administered 2016-04-26: 150 mg via INTRAVENOUS

## 2016-04-26 MED ORDER — ONDANSETRON HCL 4 MG/2ML IJ SOLN: 4.0000 mg | Freq: Once | INTRAMUSCULAR | Status: DC | PRN

## 2016-04-26 MED ORDER — LIDOCAINE 2% (20 MG/ML) 5 ML SYRINGE
INTRAMUSCULAR | Status: AC
  Filled 2016-04-26: qty 15

## 2016-04-26 MED ORDER — LABETALOL HCL 5 MG/ML IV SOLN
INTRAVENOUS | Status: AC
  Filled 2016-04-26: qty 4

## 2016-04-26 MED ORDER — SODIUM CHLORIDE 0.9 % IV SOLN: INTRAVENOUS | Status: DC

## 2016-04-26 MED ORDER — PROPOFOL 10 MG/ML IV BOLUS
INTRAVENOUS | Status: DC | PRN
  Administered 2016-04-26: 170 mg via INTRAVENOUS

## 2016-04-26 MED ORDER — METOCLOPRAMIDE HCL 5 MG/ML IJ SOLN: 5.0000 mg | Freq: Three times a day (TID) | INTRAMUSCULAR | Status: DC | PRN

## 2016-04-26 MED ORDER — MIDAZOLAM HCL 2 MG/2ML IJ SOLN
INTRAMUSCULAR | Status: AC
  Filled 2016-04-26: qty 2

## 2016-04-26 MED ORDER — INSULIN ASPART 100 UNIT/ML ~~LOC~~ SOLN: 0.0000 [IU] | Freq: Three times a day (TID) | SUBCUTANEOUS | Status: DC

## 2016-04-26 MED ORDER — ONDANSETRON HCL 4 MG PO TABS: 4.0000 mg | ORAL_TABLET | Freq: Four times a day (QID) | ORAL | Status: DC | PRN

## 2016-04-26 MED ORDER — CHLORHEXIDINE GLUCONATE 4 % EX LIQD: 60.0000 mL | Freq: Once | CUTANEOUS | Status: DC

## 2016-04-26 MED ORDER — SODIUM CHLORIDE 0.9 % IV SOLN
INTRAVENOUS | Status: DC
Start: 2016-04-26 — End: 2016-04-29
  Administered 2016-04-26 – 2016-04-28 (×4): via INTRAVENOUS

## 2016-04-26 MED ORDER — ACETAMINOPHEN 325 MG PO TABS
650.0000 mg | ORAL_TABLET | Freq: Four times a day (QID) | ORAL | Status: DC | PRN
  Administered 2016-04-26 – 2016-04-28 (×5): 650 mg via ORAL
  Filled 2016-04-26 (×5): qty 2

## 2016-04-26 SURGICAL SUPPLY — 55 items
BANDAGE ACE 4X5 VEL STRL LF (GAUZE/BANDAGES/DRESSINGS) ×2 IMPLANT
BANDAGE ESMARK 6X9 LF (GAUZE/BANDAGES/DRESSINGS) ×1 IMPLANT
BLADE SURG 10 STRL SS (BLADE) ×2 IMPLANT
BNDG CMPR 9X6 STRL LF SNTH (GAUZE/BANDAGES/DRESSINGS) ×1
BNDG COHESIVE 4X5 TAN STRL (GAUZE/BANDAGES/DRESSINGS) ×2 IMPLANT
BNDG COHESIVE 6X5 TAN STRL LF (GAUZE/BANDAGES/DRESSINGS) ×2 IMPLANT
BNDG CONFORM 3 STRL LF (GAUZE/BANDAGES/DRESSINGS) ×2 IMPLANT
BNDG ESMARK 6X9 LF (GAUZE/BANDAGES/DRESSINGS) ×2
CANISTER SUCT 3000ML PPV (MISCELLANEOUS) ×2 IMPLANT
CHLORAPREP W/TINT 26ML (MISCELLANEOUS) ×2 IMPLANT
COVER SURGICAL LIGHT HANDLE (MISCELLANEOUS) ×2 IMPLANT
CUFF TOURNIQUET SINGLE 34IN LL (TOURNIQUET CUFF) ×2 IMPLANT
CUFF TOURNIQUET SINGLE 44IN (TOURNIQUET CUFF) IMPLANT
DRAIN CHANNEL 10M FLAT 3/4 FLT (DRAIN) IMPLANT
DRAIN PENROSE 1/2X12 LTX STRL (WOUND CARE) IMPLANT
DRAPE U-SHAPE 47X51 STRL (DRAPES) ×2 IMPLANT
DRSG MEPITEL 4X7.2 (GAUZE/BANDAGES/DRESSINGS) ×2 IMPLANT
DRSG PAD ABDOMINAL 8X10 ST (GAUZE/BANDAGES/DRESSINGS) IMPLANT
DRSG VAC ATS SM SENSATRAC (GAUZE/BANDAGES/DRESSINGS) ×1 IMPLANT
ELECT REM PT RETURN 9FT ADLT (ELECTROSURGICAL) ×2
ELECTRODE REM PT RTRN 9FT ADLT (ELECTROSURGICAL) ×1 IMPLANT
EVACUATOR SILICONE 100CC (DRAIN) IMPLANT
GAUZE SPONGE 4X4 12PLY STRL (GAUZE/BANDAGES/DRESSINGS) IMPLANT
GLOVE BIO SURGEON STRL SZ8 (GLOVE) ×2 IMPLANT
GLOVE BIOGEL PI IND STRL 8 (GLOVE) ×2 IMPLANT
GLOVE BIOGEL PI INDICATOR 8 (GLOVE) ×2
GLOVE ECLIPSE 8.0 STRL XLNG CF (GLOVE) ×2 IMPLANT
GOWN STRL REUS W/ TWL XL LVL3 (GOWN DISPOSABLE) ×2 IMPLANT
GOWN STRL REUS W/TWL XL LVL3 (GOWN DISPOSABLE) ×4
IV NS IRRIG 3000ML ARTHROMATIC (IV SOLUTION) ×1 IMPLANT
KIT BASIN OR (CUSTOM PROCEDURE TRAY) ×2 IMPLANT
KIT ROOM TURNOVER OR (KITS) ×2 IMPLANT
NS IRRIG 1000ML POUR BTL (IV SOLUTION) ×2 IMPLANT
PACK ORTHO EXTREMITY (CUSTOM PROCEDURE TRAY) ×2 IMPLANT
PAD ARMBOARD 7.5X6 YLW CONV (MISCELLANEOUS) ×4 IMPLANT
PAD CAST 4YDX4 CTTN HI CHSV (CAST SUPPLIES) IMPLANT
PADDING CAST COTTON 4X4 STRL (CAST SUPPLIES)
SET CYSTO W/LG BORE CLAMP LF (SET/KITS/TRAYS/PACK) ×2 IMPLANT
SOAP 2 % CHG 4 OZ (WOUND CARE) ×2 IMPLANT
SPONGE LAP 4X18 X RAY DECT (DISPOSABLE) ×2 IMPLANT
STAPLER VISISTAT 35W (STAPLE) IMPLANT
SUCTION FRAZIER HANDLE 10FR (MISCELLANEOUS) ×1
SUCTION TUBE FRAZIER 10FR DISP (MISCELLANEOUS) ×1 IMPLANT
SUT ETHILON 2 0 FS 18 (SUTURE) IMPLANT
SUT PROLENE 3 0 PS 2 (SUTURE) IMPLANT
SUT VIC AB 2-0 CT1 27 (SUTURE)
SUT VIC AB 2-0 CT1 TAPERPNT 27 (SUTURE) IMPLANT
SUT VIC AB 3-0 PS2 18 (SUTURE)
SUT VIC AB 3-0 PS2 18XBRD (SUTURE) IMPLANT
TOWEL OR 17X24 6PK STRL BLUE (TOWEL DISPOSABLE) ×2 IMPLANT
TOWEL OR 17X26 10 PK STRL BLUE (TOWEL DISPOSABLE) ×2 IMPLANT
TUBE CONNECTING 12X1/4 (SUCTIONS) ×2 IMPLANT
TUBING CYSTO DISP (UROLOGICAL SUPPLIES) ×2 IMPLANT
WATER STERILE IRR 1000ML POUR (IV SOLUTION) ×2 IMPLANT
YANKAUER SUCT BULB TIP NO VENT (SUCTIONS) ×2 IMPLANT

## 2016-04-26 NOTE — ED Notes (Signed)
Phlebotomy at the bedside  

## 2016-04-26 NOTE — Anesthesia Procedure Notes (Signed)
Procedure Name: Intubation Date/Time: 04/26/2016 3:09 PM Performed by: Shirlyn Goltz Pre-anesthesia Checklist: Patient identified, Emergency Drugs available, Suction available and Patient being monitored Patient Re-evaluated:Patient Re-evaluated prior to inductionOxygen Delivery Method: Circle system utilized Preoxygenation: Pre-oxygenation with 100% oxygen Intubation Type: IV induction Ventilation: Mask ventilation without difficulty Laryngoscope Size: Mac and 4 Grade View: Grade II Tube type: Oral Tube size: 7.0 mm Number of attempts: 1 Airway Equipment and Method: Stylet Placement Confirmation: ETT inserted through vocal cords under direct vision,  positive ETCO2 and breath sounds checked- equal and bilateral Secured at: 21 cm Tube secured with: Tape Dental Injury: Teeth and Oropharynx as per pre-operative assessment

## 2016-04-26 NOTE — Consult Note (Signed)
Medical Consultation   Michael Hutchinson  ZOX:[id number]  DOB: [birth year]  DOA: 04/26/2016  PCP: Thomasene Lot, DO   Outpatient Specialists:  Dr. Victorino Dike - orthopedist  Requesting physician: Dr. Victorino Dike  Reason for consultation: Management of medical issues   History of Present Illness:  Michael Hutchinson is an 41 y.o. male with  history  of diabetes mellitus type 1, broadening necrosis and chronic osteomyelitis of left ankle and Charcot's foot requiring below-knee amputation of the left leg, dyslipidemia, hypertension, GERD and medical noncompliance presented to the emergency department with complaints of drainage from the left leg stump wound. Patient reported that during the last 2 or 3 days he noted some redness and swelling over the stump and started on Keflex; he has been running fevers with the highest of 103.38F over the weekend and was taking Ibuprofen. Last night patient noticed the small ulcer on top of the stump which ruptured opened and  started draining serosanguineous fluid. Patient called the orthopedist office and was advised to go to the emergency department for evaluation  In the ED patient was febrile, but tachycardic with heart rate of 122, blood pressure was accelerated to 187/79 mmHg. Blood work demonstrated anemia with hemoglobin 11.3, normal white blood cells count, mild hyponatremia with sodium 133. Lactic acid was normal 1.56 Left leg x-ray did not show any signs suspicious for osteomyelitis   ROS  Review of Systems: As per HPI otherwise 10 point review of systems negative.    Past Medical History: Past Medical History:  Diagnosis Date  . Charcot's joint of foot 11/25/2013  . Complication of anesthesia    "I wake up angry" (12/31/2015)  . Diabetes mellitus type 1 (HCC) dx'd 1981  . Diabetic ketoacidosis (HCC)   . Essential hypertension 05/03/2013  . GERD (gastroesophageal reflux disease)   . High cholesterol   . Hx MRSA infection    Inner  thigh and under arm- healed areas  . Meniscus tear   . Shortness of breath    with exertion only    Past Surgical History: Past Surgical History:  Procedure Laterality Date  . AMPUTATION Left 01/01/2016   Procedure: AMPUTATION BELOW KNEE;  Surgeon: Toni Arthurs, MD;  Location: [facility] OR;  Service: Orthopedics;  Laterality: Left;  . FRACTURE SURGERY    . HERNIA REPAIR    . KNEE ARTHROSCOPY Left ~ 2010  . LAPAROSCOPIC CHOLECYSTECTOMY  2015  . METACARPOPHALANGEAL JOINT ARTHRODESIS Left 06/2012   Fracture left index finger intra-articular MCP joint/notes 06/30/2012  . OPEN REDUCTION INTERNAL FIXATION (ORIF) PROXIMAL PHALANX Left 06/30/2012   Procedure: OPEN REDUCTION INTERNAL FIXATION (ORIF) LEFT INDEX FINGER PROXIMAL PHALANX FRACTURE WITH LIGAMENT REPAIR AS NECESSARY;  Surgeon: Dominica Severin, MD;  Location: [facility] OR;  Service: Orthopedics;  Laterality: Left;  . UMBILICAL HERNIA REPAIR  2015   "w/gallbladder OR"     Allergies:   Allergies  Allergen Reactions  . Influenza Vac Split Quad Nausea And Vomiting     Social History:  reports that he has never smoked. He quit smokeless tobacco use about 5 years ago. His smokeless tobacco use included Snuff and Chew. He reports that he drinks about 8.4 oz of alcohol per week . He reports that he does not use drugs.   Family History: Family History  Problem Relation Age of Onset  . Other Mother   . Cancer Mother     Breast / Bone  .  Heart attack Father   . Hypertension Father   . Hyperlipidemia Father   . Diabetes    . Alcohol abuse Sister   . Diabetes Maternal Grandfather   . Stroke Paternal Grandmother   . Alcohol abuse Paternal Grandfather       Physical Exam: Vitals:   04/26/16 1145 04/26/16 1202 04/26/16 1230 04/26/16 1315  BP: 139/78 139/78 115/79 130/86  Pulse: (!) 119 (!) 116 (!) 117 (!) 122  Resp:  16    Temp:      TempSrc:      SpO2: 100% 99% 99% 99%  Weight:      Height:        Constitutional: Alert and awake,  oriented x3, not in any acute distress. Eyes: PERLA, EOMI, irises appear normal, anicteric sclera,  ENMT: external ears and nose appear normal, normal hearing           Lips appears normal, oropharynx mucosa, tongue, posterior pharynx appear normal  Neck: neck appears normal, no masses, normal ROM, no thyromegaly, no JVD  CVS: S1-S2 clear, no murmur rubs or gallops, no LE edema, normal pedal pulses  Respiratory:  clear to auscultation bilaterally, no wheezing, rales or rhonchi. Respiratory effort normal. No accessory muscle use.  Abdomen: soft nontender, nondistended, normal bowel sounds, no hepatosplenomegaly, no hernias  Musculoskeletal: no cyanosis, clubbing or edema noted bilaterally, s/p BKA of the LLE, stump is swollen, erythematous, has wound on the anterior surface is draining serosanguineous exudate  Neuro: Cranial nerves II-XII intact, strength, sensation, reflexes Psych: judgement and insight appear normal, stable mood and affect, mental status Skin: no rashes or lesions or ulcers, no induration or nodules   Data reviewed:  I have personally reviewed following labs and imaging studies Labs:  CBC:  Recent Labs Lab 04/26/16 1009  WBC 8.1  NEUTROABS 6.4  HGB 11.3*  HCT 33.3*  MCV 85.4  PLT 292    Basic Metabolic Panel:  Recent Labs Lab 04/26/16 1009  NA 133*  K 4.2  CL 99*  CO2 25  GLUCOSE 250*  BUN 13  CREATININE 1.04  CALCIUM 8.8*   GFR Estimated Creatinine Clearance: 137 mL/min (by C-G formula based on SCr of 1.04 mg/dL). Liver Function Tests:  Recent Labs Lab 04/26/16 1009  AST 13*  ALT 14*  ALKPHOS 94  BILITOT 0.8  PROT 7.1  ALBUMIN 3.2*   No results for input(s): LIPASE, AMYLASE in the last 168 hours. No results for input(s): AMMONIA in the last 168 hours. Coagulation profile No results for input(s): INR, PROTIME in the last 168 hours.  Cardiac Enzymes: No results for input(s): CKTOTAL, CKMB, CKMBINDEX, TROPONINI in the last 168  hours. BNP: Invalid input(s): POCBNP CBG: No results for input(s): GLUCAP in the last 168 hours. D-Dimer No results for input(s): DDIMER in the last 72 hours. Hgb A1c No results for input(s): HGBA1C in the last 72 hours. Lipid Profile No results for input(s): CHOL, HDL, LDLCALC, TRIG, CHOLHDL, LDLDIRECT in the last 72 hours. Thyroid function studies No results for input(s): TSH, T4TOTAL, T3FREE, THYROIDAB in the last 72 hours.  Invalid input(s): FREET3 Anemia work up No results for input(s): VITAMINB12, FOLATE, FERRITIN, TIBC, IRON, RETICCTPCT in the last 72 hours. Urinalysis    Component Value Date/Time   COLORURINE AMBER (A) 12/10/2011 1800   APPEARANCEUR CLOUDY (A) 12/10/2011 1800   LABSPEC 1.034 (H) 12/10/2011 1800   PHURINE 7.5 12/10/2011 1800   GLUCOSEU 500 (A) 12/10/2011 1800   HGBUR NEGATIVE 12/10/2011 1800  BILIRUBINUR SMALL (A) 12/10/2011 1800   KETONESUR 15 (A) 12/10/2011 1800   PROTEINUR 100 (A) 12/10/2011 1800   UROBILINOGEN 2.0 (H) 12/10/2011 1800   NITRITE NEGATIVE 12/10/2011 1800   LEUKOCYTESUR NEGATIVE 12/10/2011 1800     Microbiology No results found for this or any previous visit (from the past 240 hour(s)).     Inpatient Medications:   Scheduled Meds: . atorvastatin  40 mg Oral Daily  . docusate sodium  100 mg Oral BID  . hydrochlorothiazide  25 mg Oral Daily  . insulin aspart  0-20 Units Subcutaneous TID WC  . insulin aspart  0-5 Units Subcutaneous QHS  . insulin aspart  0-9 Units Subcutaneous TID WC  . insulin glargine  40 Units Subcutaneous QHS  . losartan  50 mg Oral Daily   Continuous Infusions: . sodium chloride    . ceFEPime (MAXIPIME) IV    . vancomycin    . vancomycin       Radiological Exams on Admission: Dg Tibia/fibula Left  Result Date: 04/26/2016 CLINICAL DATA:  Skin ulceration.  Amputation 4 months ago. EXAM: LEFT TIBIA AND FIBULA - 2 VIEW COMPARISON:  None. FINDINGS: The bony structures are intact. The osteotomy sites  appear sharp. No destructive bony changes that would suggest osteomyelitis. The knee joint is maintained. Extensive vascular calcifications are noted. IMPRESSION: Status post below-knee amputation. No findings suspicious for osteomyelitis. Electronically Signed   By: Rudie Meyer M.D.   On: 04/26/2016 11:21    Impression/Recommendations Principal Problem:   Abscess of left leg Active Problems:   Diabetes mellitus type 1 (HCC)   Essential hypertension   Hyperlipidemia   H/O noncompliance with medical treatment, presenting hazards to health  Abscess of the left leg in patient with history of MRSA - admitted on ortho service and ID consult was requested Patient will have time in OR for I&D of the surgical stump  Diabetes mellitus type 1 Last hemoglobin A1c in December 2017 was 11.5, refresh hemoglobin A1c  Continue insulin, maintained on carb modified diet, and monitor CBGs qac and hs  HTN - continue home meds, that patient was not taking during the past week, monitor BP and adjust meds if needed  Hyperlipidemia - continue lipitor   Thank you for this consultation.  Our [facility] hospitalist team will follow the patient with you.  Time Spent:  65 minutes  Raymon Mutton M.D. [facility] 04/26/2016, 1:29 PM

## 2016-04-26 NOTE — ED Notes (Signed)
Pt being transported to OR bay 36.

## 2016-04-26 NOTE — Anesthesia Postprocedure Evaluation (Signed)
Anesthesia Post Note  Patient: Michael Hutchinson  Procedure(s) Performed: Procedure(s) (LRB): IRRIGATION AND DEBRIDEMENT EXTREMITY/Left Leg/Possible Wound Vac (Left)  Patient location during evaluation: PACU Anesthesia Type: General Level of consciousness: awake, awake and alert and oriented Pain management: pain level controlled Vital Signs Assessment: post-procedure vital signs reviewed and stable Respiratory status: spontaneous breathing, nonlabored ventilation and respiratory function stable Cardiovascular status: blood pressure returned to baseline Anesthetic complications: no       Last Vitals:  Vitals:   04/26/16 1615 04/26/16 1700  BP: (!) 146/90 (!) 146/73  Pulse: (!) 105 (!) 111  Resp: 16 20  Temp:  37.2 C    Last Pain:  Vitals:   04/26/16 1700  TempSrc: Oral  PainSc:                  Ankit Degregorio COKER

## 2016-04-26 NOTE — Transfer of Care (Signed)
Immediate Anesthesia Transfer of Care Note  Patient: Michael Hutchinson  Procedure(s) Performed: Procedure(s): IRRIGATION AND DEBRIDEMENT EXTREMITY/Left Leg/Possible Wound Vac (Left)  Patient Location: PACU  Anesthesia Type:General  Level of Consciousness: awake, alert , oriented and patient cooperative  Airway & Oxygen Therapy: Patient Spontanous Breathing and Patient connected to nasal cannula oxygen  Post-op Assessment: Report given to RN and Post -op Vital signs reviewed and stable  Post vital signs: Reviewed and stable  Last Vitals:  Vitals:   04/26/16 1345 04/26/16 1543  BP: 124/79   Pulse: (!) 115 (!) 118  Resp:  16  Temp:      Last Pain:  Vitals:   04/26/16 1002  TempSrc: Oral  PainSc: 0-No pain         Complications: No apparent anesthesia complications

## 2016-04-26 NOTE — ED Triage Notes (Signed)
Per Pt, Pt had an amputation four months ago on the left leg. Pt reports noticing a skin ulcer pop up yesterday and then burst today. Serous sanguinous drainage noted at the site. Erythema noted to the leg.

## 2016-04-26 NOTE — ED Provider Notes (Signed)
[place] DEPT Provider Note   CSN: [id number] Arrival date & time: 04/26/16  6203     History   Chief Complaint Chief Complaint  Patient presents with  . Skin Ulcer    HPI Michael Hutchinson is a 41 y.o. male with history of left BKA on 01/01/2016 (Dr. Doran Durand) who presents with redness, pain, and drainage from amputation site. Patient states he was healing well and told he could follow up in a year at his follow-up appointment weeks ago. He began having redness 4-5 days ago. Patient developed an ulcer yesterday, which ruptured today with serous sanguinous drainage. Patient called Dr. Nona Dell office who prescribed Keflex. Patient has been taking Keflex for 2 days. Patient reports spread of redness. Patient had a fever up until he began taking Keflex 2 days ago. Patient denies any chest pain, shortness of breath, abdominal pain, nausea, vomiting, urinary symptoms. Patient is not taking any other medications other than his regular.  HPI  Past Medical History:  Diagnosis Date  . Charcot's joint of foot 11/25/2013  . Complication of anesthesia    "I wake up angry" (12/31/2015)  . Diabetes mellitus type 1 ([place]) dx'd 1981  . Diabetic ketoacidosis ([place])   . Essential hypertension 05/03/2013  . GERD (gastroesophageal reflux disease)   . High cholesterol   . Hx MRSA infection    Inner thigh and under arm- healed areas  . Meniscus tear   . Shortness of breath    with exertion only    Patient Active Problem List   Diagnosis Date Noted  . Abscess of left leg 04/26/2016  . Normocytic anemia 01/01/2016  . Osteomyelitis ([place]) 12/31/2015  . Diabetic ulcer of left heel associated with type 1 diabetes mellitus, with necrosis of bone ([place]) 12/31/2015  . Ankle wound 12/31/2015  . Chronic osteomyelitis of left ankle with draining sinus ([place])   . Type 1 diabetes, uncontrolled, with Charcot's joint of foot ([place]) 10/12/2015  . Low HDL (under 40) 09/22/2015  . Morbidly obese ([place]) 09/22/2015    . H/O noncompliance with medical treatment, presenting hazards to health 09/22/2015  . Hx MRSA infection 09/22/2015  . Vitamin D deficiency 09/22/2015  . Diabetes mellitus type 1 ([place])   . Charcot's joint of foot 11/25/2013  . Essential hypertension 05/03/2013  . Hyperlipidemia 07/26/2012  . Diabetic ketoacidosis ([place])   . GERD (gastroesophageal reflux disease)   . Meniscus tear   . Epigastric pain 12/10/2011  . Vomiting 12/10/2011  . Problems influencing health status 11/21/2011    Past Surgical History:  Procedure Laterality Date  . AMPUTATION Left 01/01/2016   Procedure: AMPUTATION BELOW KNEE;  Surgeon: [name], MD;  Location: [facility];  Service: Orthopedics;  Laterality: Left;  . FRACTURE SURGERY    . HERNIA REPAIR    . KNEE ARTHROSCOPY Left ~ 2010  . LAPAROSCOPIC CHOLECYSTECTOMY  2015  . METACARPOPHALANGEAL JOINT ARTHRODESIS Left 06/2012   Fracture left index finger intra-articular MCP joint/notes 06/30/2012  . OPEN REDUCTION INTERNAL FIXATION (ORIF) PROXIMAL PHALANX Left 06/30/2012   Procedure: OPEN REDUCTION INTERNAL FIXATION (ORIF) LEFT INDEX FINGER PROXIMAL PHALANX FRACTURE WITH LIGAMENT REPAIR AS NECESSARY;  Surgeon: [name], MD;  Location: [facility];  Service: Orthopedics;  Laterality: Left;  . UMBILICAL HERNIA REPAIR  2015   "w/gallbladder OR"       Home Medications    Prior to Admission medications   Medication Sig Start Date End Date Taking? Authorizing Provider  atorvastatin (LIPITOR) 40 MG tablet Take 1 tablet (  40 mg total) by mouth daily at 6 PM. Patient taking differently: Take 40 mg by mouth daily.  08/25/15  Yes Deborah Opalski, DO  BAYER MICROLET LANCETS lancets Use as instructed to check 3 times daily. 10/12/15  Yes Philemon Kingdom, MD  Blood Glucose Monitoring Suppl (ONE TOUCH ULTRA SYSTEM KIT) w/Device KIT 1 kit by Does not apply route once. Check blood sugar 3 times daily. Diagnosis Diabetes ICD-10 E11.8   Yes Historical Provider, MD  cephALEXin  (KEFLEX) 500 MG capsule Take 500 mg by mouth 3 (three) times daily. 04/24/16  Yes Historical Provider, MD  glucagon (GLUCAGON EMERGENCY) 1 MG injection Inject 1 mg into the muscle once as needed. 10/12/15  Yes Philemon Kingdom, MD  glucose blood (BAYER CONTOUR NEXT TEST) test strip Use as instructed to check 3 times daily. 10/12/15  Yes Philemon Kingdom, MD  hydrochlorothiazide (HYDRODIURIL) 25 MG tablet Take 1 tablet (25 mg total) by mouth daily. 09/22/15  Yes Deborah Opalski, DO  ibuprofen (ADVIL,MOTRIN) 800 MG tablet Take 1 tablet (800 mg total) by mouth 3 (three) times daily. 01/03/16  Yes Lorella Nimrod, MD  insulin glargine (LANTUS) 100 UNIT/ML injection Inject 40 units at bedtime 12/28/15  Yes Philemon Kingdom, MD  insulin lispro (HUMALOG) 100 UNIT/ML injection Inject 3-20 Units into the skin 3 (three) times daily before meals. Sliding scale per carb count 4 units for every 30 carbs   Yes Historical Provider, MD  Insulin Pen Needle (BD PEN NEEDLE NANO U/F) 32G X 4 MM MISC 1 pen by Does not apply route 3 (three) times daily. 10/23/13  Yes Historical Provider, MD  losartan (COZAAR) 50 MG tablet Take 1 tablet (50 mg total) by mouth daily. 09/22/15  Yes Mellody Dance, DO    Family History Family History  Problem Relation Age of Onset  . Other Mother   . Cancer Mother     Breast / Bone  . Heart attack Father   . Hypertension Father   . Hyperlipidemia Father   . Diabetes    . Alcohol abuse Sister   . Diabetes Maternal Grandfather   . Stroke Paternal Grandmother   . Alcohol abuse Paternal Grandfather     Social History Social History  Substance Use Topics  . Smoking status: Never Smoker  . Smokeless tobacco: Former Systems developer    Types: Snuff, Chew    Quit date: 08/25/2010  . Alcohol use 8.4 oz/week    14 Cans of beer per week     Allergies   Influenza vac split quad   Review of Systems Review of Systems  Constitutional: Negative for chills and fever.  HENT: Negative for facial  swelling and sore throat.   Respiratory: Negative for shortness of breath.   Cardiovascular: Negative for chest pain.  Gastrointestinal: Negative for abdominal pain, nausea and vomiting.  Genitourinary: Negative for dysuria.  Musculoskeletal: Negative for back pain.  Skin: Positive for color change and wound. Negative for rash.  Neurological: Negative for headaches.  Psychiatric/Behavioral: The patient is not nervous/anxious.      Physical Exam Updated Vital Signs BP 124/79   Pulse (!) 118   Temp 98.6 F (37 C)   Resp 16   Ht 6' (1.829 m)   Wt 135.2 kg   SpO2 100%   BMI 40.42 kg/m   Physical Exam  Constitutional: He appears well-developed and well-nourished. No distress.  HENT:  Head: Normocephalic and atraumatic.  Mouth/Throat: Oropharynx is clear and moist. No oropharyngeal exudate.  Eyes: Conjunctivae are normal.  Pupils are equal, round, and reactive to light. Right eye exhibits no discharge. Left eye exhibits no discharge. No scleral icterus.  Neck: Normal range of motion. Neck supple. No thyromegaly present.  Cardiovascular: Regular rhythm, normal heart sounds and intact distal pulses.  Tachycardia present.  Exam reveals no gallop and no friction rub.   No murmur heard. Pulmonary/Chest: Effort normal and breath sounds normal. No stridor. No respiratory distress. He has no wheezes. He has no rales.  Abdominal: Soft. Bowel sounds are normal. He exhibits no distension. There is no tenderness. There is no rebound and no guarding.  Musculoskeletal: He exhibits no edema.  Lymphadenopathy:    He has no cervical adenopathy.  Neurological: He is alert. Coordination normal.  Skin: Skin is warm and dry. No rash noted. He is not diaphoretic. No pallor.  Erythema and tenderness to BKA of left lower extremity; erythema also spreads to posterior aspect; area of open tissue with serous sanguinous drainage as shown in photo  Psychiatric: He has a normal mood and affect.  Nursing note  and vitals reviewed.      ED Treatments / Results  Labs (all labs ordered are listed, but only abnormal results are displayed) Labs Reviewed  COMPREHENSIVE METABOLIC PANEL - Abnormal; Notable for the following:       Result Value   Sodium 133 (*)    Chloride 99 (*)    Glucose, Bld 250 (*)    Calcium 8.8 (*)    Albumin 3.2 (*)    AST 13 (*)    ALT 14 (*)    All other components within normal limits  CBC WITH DIFFERENTIAL/PLATELET - Abnormal; Notable for the following:    RBC 3.90 (*)    Hemoglobin 11.3 (*)    HCT 33.3 (*)    All other components within normal limits  SEDIMENTATION RATE - Abnormal; Notable for the following:    Sed Rate 114 (*)    All other components within normal limits  C-REACTIVE PROTEIN - Abnormal; Notable for the following:    CRP 20.7 (*)    All other components within normal limits  GLUCOSE, CAPILLARY - Abnormal; Notable for the following:    Glucose-Capillary 205 (*)    All other components within normal limits  GLUCOSE, CAPILLARY - Abnormal; Notable for the following:    Glucose-Capillary 205 (*)    All other components within normal limits  AEROBIC CULTURE (SUPERFICIAL SPECIMEN)  CULTURE, BLOOD (ROUTINE X 2)  GRAM STAIN  AEROBIC/ANAEROBIC CULTURE (SURGICAL/DEEP WOUND)  HEMOGLOBIN A1C  HEPATITIS C ANTIBODY  I-STAT CG4 LACTIC ACID, ED  I-STAT CG4 LACTIC ACID, ED    EKG  EKG Interpretation None       Radiology Dg Tibia/fibula Left  Result Date: 04/26/2016 CLINICAL DATA:  Skin ulceration.  Amputation 4 months ago. EXAM: LEFT TIBIA AND FIBULA - 2 VIEW COMPARISON:  None. FINDINGS: The bony structures are intact. The osteotomy sites appear sharp. No destructive bony changes that would suggest osteomyelitis. The knee joint is maintained. Extensive vascular calcifications are noted. IMPRESSION: Status post below-knee amputation. No findings suspicious for osteomyelitis. Electronically Signed   By: Marijo Sanes M.D.   On: 04/26/2016 11:21     Procedures Procedures (including critical care time)  Medications Ordered in ED Medications  docusate sodium (COLACE) capsule 100 mg ( Oral Automatically Held 05/04/16 2200)  insulin glargine (LANTUS) injection 40 Units ( Subcutaneous Automatically Held 05/04/16 2200)  hydrochlorothiazide (HYDRODIURIL) tablet 25 mg ( Oral Automatically Held 05/04/16 1000)  losartan (COZAAR) tablet 50 mg ( Oral Automatically Held 05/04/16 1000)  atorvastatin (LIPITOR) tablet 40 mg ( Oral Automatically Held 05/04/16 1000)  acetaminophen (TYLENOL) tablet 650 mg ( Oral MAR Hold 04/26/16 1409)    Or  acetaminophen (TYLENOL) suppository 650 mg ( Rectal MAR Hold 04/26/16 1409)  0.9 %  sodium chloride infusion ( Intravenous Anesthesia Volume Adjustment 04/26/16 1545)  oxyCODONE (Oxy IR/ROXICODONE) immediate release tablet 5-10 mg ( Oral MAR Hold 04/26/16 1409)  morphine 4 MG/ML injection 4 mg ( Intravenous MAR Hold 04/26/16 1409)  ceFEPIme (MAXIPIME) 1 g in dextrose 5 % 50 mL IVPB ( Intravenous Automatically Held 05/04/16 2200)  vancomycin (VANCOCIN) 1,250 mg in sodium chloride 0.9 % 250 mL IVPB ( Intravenous Automatically Held 05/04/16 2230)  insulin aspart (novoLOG) injection 0-5 Units ( Subcutaneous Automatically Held 05/04/16 2200)  insulin aspart (novoLOG) injection 0-9 Units ( Subcutaneous Automatically Held 05/04/16 1700)  fentaNYL (SUBLIMAZE) injection 25-50 mcg (not administered)  ondansetron (ZOFRAN) injection 4 mg (not administered)  oxyCODONE (Oxy IR/ROXICODONE) immediate release tablet 5 mg (not administered)    Or  oxyCODONE (ROXICODONE) 5 MG/5ML solution 5 mg (not administered)  sodium chloride 0.9 % bolus 1,000 mL (1,000 mLs Intravenous New Bag/Given 04/26/16 1222)  ceFEPIme (MAXIPIME) 2 g in dextrose 5 % 50 mL IVPB (0 g Intravenous Stopped 04/26/16 1259)  vancomycin (VANCOCIN) 2,000 mg in sodium chloride 0.9 % 500 mL IVPB (2,000 mg Intravenous New Bag/Given 04/26/16 1332)     Initial Impression /  Assessment and Plan / ED Course  I have reviewed the triage vital signs and the nursing notes.  Pertinent labs & imaging results that were available during my care of the patient were reviewed by me and considered in my medical decision making (see chart for details).     Patient with cellulitis with open abscess. CBC shows hemoglobin 11.3. CMP shows sodium 133, chloride 99, glucose 250. Lactate 1.56. Fluids initiated for tachycardia. Patient is afebrile with white count and normal limits and stable pressures. Patient evaluated by his orthopedist, Dr. Doran Durand, who was made aware of patient's presence in the ED by patient and his wife and will take the patient to the OR and admit to his service. Per Dr. Nona Dell recommendation, cefepime and vancomycin initiated in the ED. Wound culture sent and pending. Patient also evaluated by Dr. Venora Maples regarding the patient's management and agrees with plan.  Final Clinical Impressions(s) / ED Diagnoses   Final diagnoses:  Abscess of left lower extremity    New Prescriptions Current Discharge Medication List       Frederica Kuster, PA-C 04/26/16 [place], MD 04/27/16 1553

## 2016-04-26 NOTE — Discharge Instructions (Addendum)
Michael Arthurs, MD [facility]  Please read the following information regarding your care after surgery.  Medications  You only need a prescription for the narcotic pain medicine (ex. oxycodone, Percocet, Norco).  All of the other medicines listed below are available over the counter. X acetominophen (Tylenol) 650 mg every 4-6 hours as you need for minor pain X aleve 220 mg - 2 tablets twice daily with food as needed for pain X tramadol 50 mg as prescribed for severe pain  Weight Bearing X Do not bear any weight on the operated leg or foot.  Dressing X Keep your dressing clean and dry.  Dont put anything (coat hanger, pencil, etc) down inside of it.  If it gets damp, use a hair dryer on the cool setting to dry it.  X Home health nursing with change wound vac and administer IV antibiotics.  Swelling It is normal for you to have swelling where you had surgery.  To reduce swelling and pain, keep your toes above your nose for at least 3 days after surgery.  It may be necessary to keep your foot or leg elevated for several weeks.  If it hurts, it should be elevated.  Follow Up Call my office at [phone] when you are discharged from the hospital or surgery center to schedule an appointment to be seen two weeks after surgery.  Call my office at [phone] if you develop a fever >101.5 F, nausea, vomiting, bleeding from the surgical site or severe pain.

## 2016-04-26 NOTE — Consult Note (Signed)
[name] for Infectious Disease  Date of Admission:  04/26/2016  Date of Consult:  04/26/2016  Reason for Consult: Wound infection Referring Physician: Doran Durand  Impression/Recommendation Wound infection Plain film no osteo Started on vanco/cefepime in ed Will check BCx To OR this PM Suspect that this is staph  DM1 Check Hep C as routine health maintenance. HIV (-) 12-2015 Will need good control to promote healing.  Cr normal Check A1C  Thank you so much for this interesting consult,   Michael Hutchinson (pager) [phone] [email or web address]  Michael Hutchinson is an 41 y.o. male.  HPI: 41 yo M with hx of DM1 and previous L BKA 01-01-16. He states his wound healed well and he had minimal flaking of his wound.  On 4-12 he wore his prosthesis without difficulty, normal wt bearing.  On 4-13 he wore his prosthesis but felt poorly, noted stinging nad erythema in his wound.  On 4-14 he noted erythema and developed temp to 103.2. This improved with NSAID.  On 4-15 his wound felt tight and he noted erythema that tracked to the inferior portion of his stump. He was started on keflex.  On 4-16 his swelling had improved but he had more fever. He noted a pustule on his wound.  On 4-17 after removing his bandage, he described his knee as "puking black, red, brown" material.  He came to the hospital.   Past Medical History:  Diagnosis Date  . Charcot's joint of foot 11/25/2013  . Complication of anesthesia    "I wake up angry" (12/31/2015)  . Diabetes mellitus type 1 ([place]) dx'd 1981  . Diabetic ketoacidosis ([place])   . Essential hypertension 05/03/2013  . GERD (gastroesophageal reflux disease)   . High cholesterol   . Hx MRSA infection    Inner thigh and under arm- healed areas  . Meniscus tear   . Shortness of breath    with exertion only    Past Surgical History:  Procedure Laterality Date  . AMPUTATION Left 01/01/2016   Procedure: AMPUTATION BELOW KNEE;   Surgeon: [name], MD;  Location: [place];  Service: Orthopedics;  Laterality: Left;  . FRACTURE SURGERY    . HERNIA REPAIR    . KNEE ARTHROSCOPY Left ~ 2010  . LAPAROSCOPIC CHOLECYSTECTOMY  2015  . METACARPOPHALANGEAL JOINT ARTHRODESIS Left 06/2012   Fracture left index finger intra-articular MCP joint/notes 06/30/2012  . OPEN REDUCTION INTERNAL FIXATION (ORIF) PROXIMAL PHALANX Left 06/30/2012   Procedure: OPEN REDUCTION INTERNAL FIXATION (ORIF) LEFT INDEX FINGER PROXIMAL PHALANX FRACTURE WITH LIGAMENT REPAIR AS NECESSARY;  Surgeon: [name], MD;  Location: [place];  Service: Orthopedics;  Laterality: Left;  . UMBILICAL HERNIA REPAIR  2015   "w/gallbladder OR"     Allergies  Allergen Reactions  . Influenza Vac Split Quad Nausea And Vomiting    Medications:  Scheduled: . atorvastatin  40 mg Oral Daily  . docusate sodium  100 mg Oral BID  . hydrochlorothiazide  25 mg Oral Daily  . insulin aspart  0-20 Units Subcutaneous TID WC  . insulin aspart  0-5 Units Subcutaneous QHS  . insulin aspart  0-9 Units Subcutaneous TID WC  . insulin glargine  40 Units Subcutaneous QHS  . losartan  50 mg Oral Daily    Abtx:  Anti-infectives    Start     Dose/Rate Route Frequency Ordered Stop   04/26/16 2230  vancomycin (VANCOCIN) 1,250 mg in sodium chloride 0.9 % 250 mL IVPB  1,250 mg 166.7 mL/hr over 90 Minutes Intravenous Every 8 hours 04/26/16 1233     04/26/16 2200  ceFEPIme (MAXIPIME) 1 g in dextrose 5 % 50 mL IVPB     1 g 100 mL/hr over 30 Minutes Intravenous Every 8 hours 04/26/16 1233     04/26/16 1215  ceFEPIme (MAXIPIME) 2 g in dextrose 5 % 50 mL IVPB     2 g 100 mL/hr over 30 Minutes Intravenous  Once 04/26/16 1202 04/26/16 1259   04/26/16 1215  vancomycin (VANCOCIN) 2,000 mg in sodium chloride 0.9 % 500 mL IVPB     2,000 mg 250 mL/hr over 120 Minutes Intravenous  Once 04/26/16 1202        Total days of antibiotics: 0 vanco/cefepime          Social History:  reports  that he has never smoked. He quit smokeless tobacco use about 5 years ago. His smokeless tobacco use included Snuff and Chew. He reports that he drinks about 8.4 oz of alcohol per week . He reports that he does not use drugs.  Family History  Problem Relation Age of Onset  . Other Mother   . Cancer Mother     Breast / Bone  . Heart attack Father   . Hypertension Father   . Hyperlipidemia Father   . Diabetes    . Alcohol abuse Sister   . Diabetes Maternal Grandfather   . Stroke Paternal Grandmother   . Alcohol abuse Paternal Grandfather     General ROS: worsening vision in L eye (has quit taking injections). +neuropahty. denies nephropathy. normal urination, normal BM, no sob. + fever.  Please see HPI. 12 point ROS o/w (-)  Blood pressure 115/79, pulse (!) 117, temperature 98.1 F (36.7 C), temperature source Oral, resp. rate 16, height 6' (1.829 m), weight 135.2 kg (298 lb), SpO2 99 %. General appearance: alert, cooperative and no distress Eyes: negative findings: conjunctivae and sclerae normal and pupils equal, round, reactive to light and accomodation Throat: normal findings: oropharynx pink & moist without lesions or evidence of thrush Neck: no adenopathy and supple, symmetrical, trachea midline Lungs: clear to auscultation bilaterally Heart: regular rate and rhythm Abdomen: normal findings: bowel sounds normal and soft, non-tender Extremities: edema 2+ RLE  and wound is open with bloody- creamy d/c. heat erythema.  Pulses: Right Pulse: 2+ dorsalis pedis   Results for orders placed or performed during the hospital encounter of 04/26/16 (from the past 48 hour(s))  Comprehensive metabolic panel     Status: Abnormal   Collection Time: 04/26/16 10:09 AM  Result Value Ref Range   Sodium 133 (L) 135 - 145 mmol/L   Potassium 4.2 3.5 - 5.1 mmol/L   Chloride 99 (L) 101 - 111 mmol/L   CO2 25 22 - 32 mmol/L   Glucose, Bld 250 (H) 65 - 99 mg/dL   BUN 13 6 - 20 mg/dL   Creatinine,  Ser 1.04 0.61 - 1.24 mg/dL   Calcium 8.8 (L) 8.9 - 10.3 mg/dL   Total Protein 7.1 6.5 - 8.1 g/dL   Albumin 3.2 (L) 3.5 - 5.0 g/dL   AST 13 (L) 15 - 41 U/L   ALT 14 (L) 17 - 63 U/L   Alkaline Phosphatase 94 38 - 126 U/L   Total Bilirubin 0.8 0.3 - 1.2 mg/dL   GFR calc non Af Amer >60 >60 mL/min   GFR calc Af Amer >60 >60 mL/min    Comment: (NOTE) The eGFR has  been calculated using the CKD EPI equation. This calculation has not been validated in all clinical situations. eGFR's persistently <60 mL/min signify possible Chronic Kidney Disease.    Anion gap 9 5 - 15  CBC with Differential     Status: Abnormal   Collection Time: 04/26/16 10:09 AM  Result Value Ref Range   WBC 8.1 4.0 - 10.5 K/uL   RBC 3.90 (L) 4.22 - 5.81 MIL/uL   Hemoglobin 11.3 (L) 13.0 - 17.0 g/dL   HCT 33.3 (L) 39.0 - 52.0 %   MCV 85.4 78.0 - 100.0 fL   MCH 29.0 26.0 - 34.0 pg   MCHC 33.9 30.0 - 36.0 g/dL   RDW 12.9 11.5 - 15.5 %   Platelets 292 150 - 400 K/uL   Neutrophils Relative % 78 %   Neutro Abs 6.4 1.7 - 7.7 K/uL   Lymphocytes Relative 11 %   Lymphs Abs 0.9 0.7 - 4.0 K/uL   Monocytes Relative 9 %   Monocytes Absolute 0.7 0.1 - 1.0 K/uL   Eosinophils Relative 2 %   Eosinophils Absolute 0.2 0.0 - 0.7 K/uL   Basophils Relative 0 %   Basophils Absolute 0.0 0.0 - 0.1 K/uL  I-Stat CG4 Lactic Acid, ED     Status: None   Collection Time: 04/26/16 10:23 AM  Result Value Ref Range   Lactic Acid, Venous 1.56 0.5 - 1.9 mmol/L  C-reactive protein     Status: Abnormal   Collection Time: 04/26/16 12:20 PM  Result Value Ref Range   CRP 20.7 (H) <1.0 mg/dL      Component Value Date/Time   SDES BLOOD RIGHT HAND 05/23/2010 2110   SPECREQUEST BOTTLES DRAWN AEROBIC ONLY 3CC 05/23/2010 2110   CULT NO GROWTH 5 DAYS 05/23/2010 2110   REPTSTATUS 05/30/2010 FINAL 05/23/2010 2110   Dg Tibia/fibula Left  Result Date: 04/26/2016 CLINICAL DATA:  Skin ulceration.  Amputation 4 months ago. EXAM: LEFT TIBIA AND FIBULA -  2 VIEW COMPARISON:  None. FINDINGS: The bony structures are intact. The osteotomy sites appear sharp. No destructive bony changes that would suggest osteomyelitis. The knee joint is maintained. Extensive vascular calcifications are noted. IMPRESSION: Status post below-knee amputation. No findings suspicious for osteomyelitis. Electronically Signed   By: Marijo Sanes M.D.   On: 04/26/2016 11:21   No results found for this or any previous visit (from the past 240 hour(s)).    04/26/2016, 1:27 PM     LOS: 0 days    Records and images were personally reviewed where available.

## 2016-04-26 NOTE — Anesthesia Postprocedure Evaluation (Signed)
Anesthesia Post Note  Patient: Michael Hutchinson  Procedure(s) Performed: Procedure(s) (LRB): IRRIGATION AND DEBRIDEMENT EXTREMITY/Left Leg/Possible Wound Vac (Left)  Patient location during evaluation: PACU Anesthesia Type: General Level of consciousness: awake and alert Pain management: pain level controlled Vital Signs Assessment: post-procedure vital signs reviewed and stable Respiratory status: spontaneous breathing, nonlabored ventilation, respiratory function stable and patient connected to nasal cannula oxygen Cardiovascular status: blood pressure returned to baseline and stable Postop Assessment: no signs of nausea or vomiting Anesthetic complications: no       Last Vitals:  Vitals:   04/26/16 1545 04/26/16 1600  BP: 129/74 134/81  Pulse: (!) 118 (!) 113  Resp: (!) 28 (!) 27  Temp:      Last Pain:  Vitals:   04/26/16 1002  TempSrc: Oral  PainSc: 0-No pain                 Tyreesha Maharaj,W. EDMOND

## 2016-04-26 NOTE — Progress Notes (Signed)
Pharmacy Antibiotic Note  Michael Hutchinson is a 41 y.o. male admitted on 04/26/2016 with cellulitis.  Pharmacy has been consulted for vancomycin and cefepime dosing. Pt is afebrile and WBC is WNL. Scr is WNL and lactic acid is <2.   Plan: Vancomycin 2gm IV x 1 then 1250mg  IV Q8H Cefepime 2gm IV x 1 then 1gm IV Q8H F/u renal fxn, C&S, clinical status and trough at SS  Height: 6' (182.9 cm) Weight: 298 lb (135.2 kg) IBW/kg (Calculated) : 77.6  Temp (24hrs), Avg:98.1 F (36.7 C), Min:98.1 F (36.7 C), Max:98.1 F (36.7 C)   Recent Labs Lab 04/26/16 1009 04/26/16 1023  WBC 8.1  --   CREATININE 1.04  --   LATICACIDVEN  --  1.56    Estimated Creatinine Clearance: 137 mL/min (by C-G formula based on SCr of 1.04 mg/dL).    Allergies  Allergen Reactions  . Influenza Vac Split Quad Nausea And Vomiting    Antimicrobials this admission: Vanc 4/17>> Cefepime 4/17>>  Dose adjustments this admission: N/A  Microbiology results: Pending  Thank you for allowing pharmacy to be a part of this patient's care.  An Lannan, Drake Leach 04/26/2016 12:34 PM

## 2016-04-26 NOTE — ED Notes (Signed)
Patient transported to X-ray 

## 2016-04-26 NOTE — H&P (Signed)
Michael Hutchinson is an 41 y.o. male.   Chief Complaint: left leg wound HPI:  41 y/o male with PMH of diabetes c/b left foot charcot deformity presents today with spontaneous drainage from his L BKA stump.  He is now 4 months out from L BKA.  Over the weekend he noted some redness of the leg.  He was started on keflex for cellulitis, and the redness largely resolved over the following couple of days.  He was seen in the office where he was noted to be improving from his swelling and redness.  This mornine he notes that the wound opened and drained serosanguinous fluid.  He called the office and was directed here.  He notes that his blood sugar has been high for a few weeks.  He denies any pain in the leg.  He denies f/c/n/v/wt loss.    Past Medical History:  Diagnosis Date  . Charcot's joint of foot 11/25/2013  . Complication of anesthesia    "I wake up angry" (12/31/2015)  . Diabetes mellitus type 1 ([place]) dx'd 1981  . Diabetic ketoacidosis ([place])   . Essential hypertension 05/03/2013  . GERD (gastroesophageal reflux disease)   . High cholesterol   . Hx MRSA infection    Inner thigh and under arm- healed areas  . Meniscus tear   . Shortness of breath    with exertion only    Past Surgical History:  Procedure Laterality Date  . AMPUTATION Left 01/01/2016   Procedure: AMPUTATION BELOW KNEE;  Surgeon: [name], MD;  Location: [place];  Service: Orthopedics;  Laterality: Left;  . FRACTURE SURGERY    . HERNIA REPAIR    . KNEE ARTHROSCOPY Left ~ 2010  . LAPAROSCOPIC CHOLECYSTECTOMY  2015  . METACARPOPHALANGEAL JOINT ARTHRODESIS Left 06/2012   Fracture left index finger intra-articular MCP joint/notes 06/30/2012  . OPEN REDUCTION INTERNAL FIXATION (ORIF) PROXIMAL PHALANX Left 06/30/2012   Procedure: OPEN REDUCTION INTERNAL FIXATION (ORIF) LEFT INDEX FINGER PROXIMAL PHALANX FRACTURE WITH LIGAMENT REPAIR AS NECESSARY;  Surgeon: [name], MD;  Location: [place];  Service: Orthopedics;   Laterality: Left;  . UMBILICAL HERNIA REPAIR  2015   "w/gallbladder OR"    Family History  Problem Relation Age of Onset  . Other Mother   . Cancer Mother     Breast / Bone  . Heart attack Father   . Hypertension Father   . Hyperlipidemia Father   . Diabetes    . Alcohol abuse Sister   . Diabetes Maternal Grandfather   . Stroke Paternal Grandmother   . Alcohol abuse Paternal Grandfather    Social History:  reports that he has never smoked. He quit smokeless tobacco use about 5 years ago. His smokeless tobacco use included Snuff and Chew. He reports that he drinks about 8.4 oz of alcohol per week . He reports that he does not use drugs.  Allergies:  Allergies  Allergen Reactions  . Influenza Vac Split Quad Nausea And Vomiting     (Not in a hospital admission)  Results for orders placed or performed during the hospital encounter of 04/26/16 (from the past 48 hour(s))  Comprehensive metabolic panel     Status: Abnormal   Collection Time: 04/26/16 10:09 AM  Result Value Ref Range   Sodium 133 (L) 135 - 145 mmol/L   Potassium 4.2 3.5 - 5.1 mmol/L   Chloride 99 (L) 101 - 111 mmol/L   CO2 25 22 - 32 mmol/L   Glucose, Bld 250 (  H) 65 - 99 mg/dL   BUN 13 6 - 20 mg/dL   Creatinine, Ser 1.04 0.61 - 1.24 mg/dL   Calcium 8.8 (L) 8.9 - 10.3 mg/dL   Total Protein 7.1 6.5 - 8.1 g/dL   Albumin 3.2 (L) 3.5 - 5.0 g/dL   AST 13 (L) 15 - 41 U/L   ALT 14 (L) 17 - 63 U/L   Alkaline Phosphatase 94 38 - 126 U/L   Total Bilirubin 0.8 0.3 - 1.2 mg/dL   GFR calc non Af Amer >60 >60 mL/min   GFR calc Af Amer >60 >60 mL/min    Comment: (NOTE) The eGFR has been calculated using the CKD EPI equation. This calculation has not been validated in all clinical situations. eGFR's persistently <60 mL/min signify possible Chronic Kidney Disease.    Anion gap 9 5 - 15  CBC with Differential     Status: Abnormal   Collection Time: 04/26/16 10:09 AM  Result Value Ref Range   WBC 8.1 4.0 - 10.5 K/uL    RBC 3.90 (L) 4.22 - 5.81 MIL/uL   Hemoglobin 11.3 (L) 13.0 - 17.0 g/dL   HCT 33.3 (L) 39.0 - 52.0 %   MCV 85.4 78.0 - 100.0 fL   MCH 29.0 26.0 - 34.0 pg   MCHC 33.9 30.0 - 36.0 g/dL   RDW 12.9 11.5 - 15.5 %   Platelets 292 150 - 400 K/uL   Neutrophils Relative % 78 %   Neutro Abs 6.4 1.7 - 7.7 K/uL   Lymphocytes Relative 11 %   Lymphs Abs 0.9 0.7 - 4.0 K/uL   Monocytes Relative 9 %   Monocytes Absolute 0.7 0.1 - 1.0 K/uL   Eosinophils Relative 2 %   Eosinophils Absolute 0.2 0.0 - 0.7 K/uL   Basophils Relative 0 %   Basophils Absolute 0.0 0.0 - 0.1 K/uL  I-Stat CG4 Lactic Acid, ED     Status: None   Collection Time: 04/26/16 10:23 AM  Result Value Ref Range   Lactic Acid, Venous 1.56 0.5 - 1.9 mmol/L   Dg Tibia/fibula Left  Result Date: 04/26/2016 CLINICAL DATA:  Skin ulceration.  Amputation 4 months ago. EXAM: LEFT TIBIA AND FIBULA - 2 VIEW COMPARISON:  None. FINDINGS: The bony structures are intact. The osteotomy sites appear sharp. No destructive bony changes that would suggest osteomyelitis. The knee joint is maintained. Extensive vascular calcifications are noted. IMPRESSION: Status post below-knee amputation. No findings suspicious for osteomyelitis. Electronically Signed   By: Marijo Sanes M.D.   On: 04/26/2016 11:21    ROS  As above.  No CP, SOB.  Blood pressure 134/87, pulse (!) 116, temperature 98.1 F (36.7 C), temperature source Oral, resp. rate 18, height 6' (1.829 m), weight 135.2 kg (298 lb), SpO2 99 %. Physical Exam  wn wd male in nad.  A and O x 4.  Mood and affect normal.  EOMI.  resp unlabored.  L leg with healed surgical incision medially and laterally.  An area about 3 cm long at the anteromedial incision has opened with purulent drainage.  There is erythema around the edges of this open wound.  No fluctuance.  Full ROM of the left knee.  No lymophadenopathy.  No lymphangiitis.  5/5 strength at the quad and hamstring.  Sens to LT intact at the stump.  Brisk  cap refill at the stump.    Assessment/Plan L leg abscess and cellulitis - this condition requires admission for IV abx and operative treatment  of the abscess with irrigation and debridement and likely application of a wound VAC.  He understands the plan and agrees.  We'll get cultures in the ER and start him on Vanc and cefipime.  The risks and benefits of the alternative treatment options have been discussed in detail.  The patient wishes to proceed with surgery and specifically understands risks of bleeding, infection, nerve damage, blood clots, need for additional surgery, amputation and death.   [name], MD 2016-05-01, 11:49 AM

## 2016-04-26 NOTE — Anesthesia Preprocedure Evaluation (Signed)

## 2016-04-26 NOTE — Brief Op Note (Signed)
04/26/2016  3:37 PM  PATIENT:  Michael Hutchinson  41 y.o. male  PRE-OPERATIVE DIAGNOSIS:  Left leg abscess  POST-OPERATIVE DIAGNOSIS:  Same  Procedure(s): 1.  Irrigation and excisional debridement of left leg deep abscess   2.  Application of wound vac to leg wound 4 cm x 2 cm x 2 cm  SURGEON:  Toni Arthurs, MD  ASSISTANT:  Alfredo Martinez, PA-C  ANESTHESIA:   General  EBL:  minimal   TOURNIQUET:   Total Tourniquet Time Documented: Thigh (Left) - 10 minutes Total: Thigh (Left) - 10 minutes  COMPLICATIONS:  None apparent  DISPOSITION:  Extubated, awake and stable to recovery.  DICTATION ID:  [id number]

## 2016-04-27 ENCOUNTER — Ambulatory Visit: Payer: BLUE CROSS/BLUE SHIELD | Admitting: Physical Therapy

## 2016-04-27 ENCOUNTER — Encounter (HOSPITAL_COMMUNITY): Payer: Self-pay | Admitting: Orthopedic Surgery

## 2016-04-27 DIAGNOSIS — Y838 Other surgical procedures as the cause of abnormal reaction of the patient, or of later complication, without mention of misadventure at the time of the procedure: Secondary | ICD-10-CM

## 2016-04-27 DIAGNOSIS — B9561 Methicillin susceptible Staphylococcus aureus infection as the cause of diseases classified elsewhere: Secondary | ICD-10-CM

## 2016-04-27 DIAGNOSIS — T8744 Infection of amputation stump, left lower extremity: Secondary | ICD-10-CM

## 2016-04-27 DIAGNOSIS — Z978 Presence of other specified devices: Secondary | ICD-10-CM

## 2016-04-27 DIAGNOSIS — E109 Type 1 diabetes mellitus without complications: Secondary | ICD-10-CM

## 2016-04-27 LAB — GLUCOSE, CAPILLARY
GLUCOSE-CAPILLARY: 152 mg/dL — AB (ref 65–99)
GLUCOSE-CAPILLARY: 151 mg/dL — AB (ref 65–99)
Glucose-Capillary: 86 mg/dL (ref 65–99)
Glucose-Capillary: 92 mg/dL (ref 65–99)
Glucose-Capillary: 128 mg/dL — ABNORMAL HIGH (ref 65–99)

## 2016-04-27 LAB — BASIC METABOLIC PANEL
ANION GAP: 9 (ref 5–15)
BUN: 9 mg/dL (ref 6–20)
CALCIUM: 8.4 mg/dL — AB (ref 8.9–10.3)
CHLORIDE: 102 mmol/L (ref 101–111)
CO2: 25 mmol/L (ref 22–32)
Creatinine, Ser: 0.89 mg/dL (ref 0.61–1.24)
GFR calc non Af Amer: >60 mL/min (ref >60)
GLUCOSE: 118 mg/dL — AB (ref 65–99)
POTASSIUM: 4.3 mmol/L (ref 3.5–5.1)
Sodium: 136 mmol/L (ref 135–145)

## 2016-04-27 LAB — CBC WITH DIFFERENTIAL/PLATELET
BASOS PCT: 0 %
Basophils Absolute: 0 10*3/uL (ref 0.0–0.1)
Eosinophils Absolute: 0.3 10*3/uL (ref 0.0–0.7)
Eosinophils Relative: 4 %
HEMATOCRIT: 31.9 % — AB (ref 39.0–52.0)
HEMOGLOBIN: 10.7 g/dL — AB (ref 13.0–17.0)
LYMPHS ABS: 0.9 10*3/uL (ref 0.7–4.0)
LYMPHS PCT: 15 %
MCH: 29.3 pg (ref 26.0–34.0)
MCHC: 33.5 g/dL (ref 30.0–36.0)
MCV: 87.4 fL (ref 78.0–100.0)
MONO ABS: 0.5 10*3/uL (ref 0.1–1.0)
MONOS PCT: 7 %
NEUTROS ABS: 4.8 10*3/uL (ref 1.7–7.7)
NEUTROS PCT: 74 %
Platelets: 256 10*3/uL (ref 150–400)
RBC: 3.65 MIL/uL — ABNORMAL LOW (ref 4.22–5.81)
RDW: 13.4 % (ref 11.5–15.5)
WBC: 6.5 10*3/uL (ref 4.0–10.5)

## 2016-04-27 LAB — HEMOGLOBIN A1C
HEMOGLOBIN A1C: 8.4 % — AB (ref 4.8–5.6)
MEAN PLASMA GLUCOSE: 194 mg/dL

## 2016-04-27 LAB — HEPATITIS C ANTIBODY

## 2016-04-27 LAB — MAGNESIUM: Magnesium: 1.9 mg/dL (ref 1.7–2.4)

## 2016-04-27 MED ORDER — KETOROLAC TROMETHAMINE 30 MG/ML IJ SOLN
30.0000 mg | Freq: Once | INTRAMUSCULAR | Status: AC
  Administered 2016-04-27: 30 mg via INTRAVENOUS
  Filled 2016-04-27: qty 1

## 2016-04-27 MED ORDER — METOCLOPRAMIDE HCL 5 MG/ML IJ SOLN
10.0000 mg | Freq: Once | INTRAMUSCULAR | Status: AC
  Administered 2016-04-27: 10 mg via INTRAVENOUS
  Filled 2016-04-27: qty 2

## 2016-04-27 MED ORDER — SALINE SPRAY 0.65 % NA SOLN
1.0000 | NASAL | Status: DC | PRN
  Filled 2016-04-27: qty 44

## 2016-04-27 NOTE — Care Management Note (Addendum)
Case Management Note  Patient Details  Name: Michael Hutchinson MRN: [id number] Date of Birth: 1976-01-03  Subjective/Objective:      1 Day Post-Op Procedure(s) (LRB): IRRIGATION AND DEBRIDEMENT EXTREMITY/Left Leg/Possible Wound Vac (Left)  NWB LLE Cultures pending Plan for wound vac change either Thursday or Friday Will need [facility] nursing for wound vac change and potential IV ABX pending culture results ABX per ID.  Spoke with patient at the bedside. He states he has used [place] in the past for his amp in December 2017, and would like to use them again. Referral made to [facility], she is verifying team could be available for the potential IV Abx. Patient would like to use [facility] if [facility] unable to provide care. Referral made to [facility] liaison Jeri Modena for potential IV Abx needs. Referral made to [facility] with [facility] wound VAC to notify of patient with DC needs for VAC.  Order for [facility] on front of chart for MD to complete and sign. This will need faxed to [facility] at [phone], along with H&P, op note with measurements and facesheet. These have been printed and placed on front of chart.        Action/Plan:  CM will follow for VAC and HH needs.   [facility] for [facility] if no IV ABx. [facility] for [facility] if patient DOES NEED IV Abx.  (CM to make final referral to [facility] agency when plan for abx is determined) [facility] following for potential IV Abx. VAC orders need faced to [facility]  Patient would like a RW at DC.   Expected Discharge Date:                  Expected Discharge Plan:  Home w Home Health Services  In-House Referral:     Discharge planning Services  CM Consult  Post Acute Care Choice:    Choice offered to:     DME Arranged:    DME Agency:     HH Arranged:    HH Agency:     Status of Service:  In process, will continue to follow  If discussed at Long Length of Stay Meetings, dates discussed:    Additional Comments:  Lawerance Sabal, RN 04/27/2016, 10:29 AM

## 2016-04-27 NOTE — Progress Notes (Signed)
Subjective: 1 Day Post-Op Procedure(s) (LRB): IRRIGATION AND DEBRIDEMENT EXTREMITY/Left Leg/Possible Wound Vac (Left)  Patient reports pain as mild to moderate.  Reports that he is ready to eat breakfast this morning.  Admits to flatulence.  Denies fever, chills, N/V, SOB, CP.  Objective:   VITALS:  Temp:  [98.1 F (36.7 C)-100 F (37.8 C)] 98.7 F (37.1 C) (04/18 0548) Pulse Rate:  [105-130] 108 (04/18 0548) Resp:  [16-28] 17 (04/18 0548) BP: (115-187)/(67-90) 130/80 (04/18 0548) SpO2:  [97 %-100 %] 97 % (04/18 0548) Weight:  [135.2 kg (298 lb)] 135.2 kg (298 lb) (04/17 1003)  General: WDWN patient in NAD. Psych:  Appropriate mood and affect. Neuro:  A&O x 3, Moving all extremities, sensation intact to light touch HEENT:  EOMs intact Chest:  Even non-labored respirations Skin:  Dressing C/D/I, no rashes or lesions.  Extremities: warm/dry, no visible edema, erythema, or echymosis.  No lymphadenopathy.  Wound vac intact.  Scant serosanguinous fluid in vac. Pulses: Femoral 2+ MSK:  ROM: TKE, MMT: patient is able to perform quad set    LABS  Recent Labs  04/26/16 1009  HGB 11.3*  WBC 8.1  PLT 292    Recent Labs  04/26/16 1009  NA 133*  K 4.2  CL 99*  CO2 25  BUN 13  CREATININE 1.04  GLUCOSE 250*   No results for input(s): LABPT, INR in the last 72 hours.   Assessment/Plan: 1 Day Post-Op Procedure(s) (LRB): IRRIGATION AND DEBRIDEMENT EXTREMITY/Left Leg/Possible Wound Vac (Left)  NWB LLE Cultures pending Plan for wound vac change either Thursday or Friday Will need [facility] nursing for wound vac change and potential IV ABX pending culture results ABX per ID.  Appreciate ID's assistance with patient management. Other multiple medical management per Medicine team.  Appreciate their assistance as well. Plan for 2 week outpatient post-op visit with Dr. Victorino Dike.  Alfredo Martinez, PA-C [facility] Office:  [phone]

## 2016-04-27 NOTE — Op Note (Signed)
NAME:  Michael Hutchinson, Michael Hutchinson NO.:  [id number]  MEDICAL RECORD NO.:  [id number]  LOCATION:  B17C                         FACILITY:  [facility]  PHYSICIAN:  Toni Arthurs, MD        DATE OF BIRTH:  1975-08-14  DATE OF PROCEDURE:  04/26/2016 DATE OF DISCHARGE:                              OPERATIVE REPORT   PREOPERATIVE DIAGNOSIS:  Left leg abscess.  POSTOPERATIVE DIAGNOSIS:  Left leg abscess.  PROCEDURE: 1. Irrigation and excisional debridement of left leg deep abscess. 2. Application of wound VAC to leg wound 4 cm x 2 cm x 2 cm.  SURGEON:  Toni Arthurs, MD.  ASSISTANT:  Alfredo Martinez, PA-C.  ANESTHESIA:  General.  ESTIMATED BLOOD LOSS:  Minimal.  TOURNIQUET TIME:  10 minutes at 350 mmHg.  COMPLICATIONS:  None apparent.  DISPOSITION:  Extubated awake and stable to recovery.  INDICATIONS FOR PROCEDURE:  The patient is a 41 year old male with a past medical history significant for diabetes.  He underwent left below- knee amputation in December 2017.  He was generally doing well until the weekend when he started noticing some redness around his left lower extremity.  He responded initially to Keflex orally.  This morning, however, his wound opened anteriorly and spontaneously drained.  He is admitted today and presents for an irrigation and excisional debridement of this left leg abscess.  He understands the risks and benefits of the alternative treatment options and elects surgical treatment.  He specifically understands risks of bleeding, infection, nerve damage, blood clots, need for additional surgery, continued pain, revision of amputation and death.  PROCEDURE IN DETAIL:  After preoperative consent was obtained and the correct operative site was identified, the patient was brought to the operating room and placed upon the operating table.  General anesthesia was induced.  Preoperative antibiotics were administered.  Surgical time- out was taken.  The left lower  extremity was prepped and draped in standard sterile fashion with a tourniquet around the thigh.  The extremity was exsanguinated and the tourniquet was inflated to 350 mmHg. The area of the abscess was identified at the anteromedial leg at the site of the amputation incision.  The wound edges were sharply excised with a scalpel.  Excisional debridement was then carried down through the subcutaneous tissues to the level of the muscle.  All necrotic and devitalized appearing tissue was sharply debrided with a scalpel, curette, and rongeur.  Deep specimens were obtained and sent to Microbiology for aerobic and anaerobic cultures.  Wound was then copiously irrigated with 3 L of normal saline.  A GranuFoam dressing was then cut to fit the wound and it was inserted.  It was covered with occlusive dressings.  Negative pressure at 125 mmHg was applied. Appropriate seal was achieved.  The extremity was then dressed with cast padding and Ace wrap.  The tourniquet was released after application of the dressings at 10 minutes.  The patient was awakened from anesthesia and transported to the recovery room in stable condition.  FOLLOWUP PLAN:  The patient will be nonweightbearing on the left lower extremity.  Infectious Disease and Hospitalist consultation will be obtained for management of his infection  and medical comorbidities.  He will need to be discharged home on a [facility] wound VAC.  Alfredo Martinez, PA-C, was present and scrubbed for the duration of the case.  His assistance was essential in positioning the patient, prepping and draping, gaining and maintaining exposure, performing the operation, closing and dressing the wounds and applying the wound VAC.     Toni Arthurs, MD     JH/MEDQ  D:  04/26/2016  T:  04/27/2016  Job:  [id number]

## 2016-04-27 NOTE — Progress Notes (Signed)
INFECTIOUS DISEASE PROGRESS NOTE  ID: MAOR MECKEL is a 41 y.o. male with  Principal Problem:   Abscess of left leg Active Problems:   Diabetes mellitus type 1 (HCC)   Essential hypertension   Hyperlipidemia   H/O noncompliance with medical treatment, presenting hazards to health   Diabetes mellitus with complication (HCC)  Subjective: Without complaints.  Just awoke from first sleep in 3 days.   Abtx:  Anti-infectives    Start     Dose/Rate Route Frequency Ordered Stop   04/26/16 2230  vancomycin (VANCOCIN) 1,250 mg in sodium chloride 0.9 % 250 mL IVPB     1,250 mg 166.7 mL/hr over 90 Minutes Intravenous Every 8 hours 04/26/16 1233     04/26/16 2200  ceFEPIme (MAXIPIME) 1 g in dextrose 5 % 50 mL IVPB     1 g 100 mL/hr over 30 Minutes Intravenous Every 8 hours 04/26/16 1233     04/26/16 1215  ceFEPIme (MAXIPIME) 2 g in dextrose 5 % 50 mL IVPB     2 g 100 mL/hr over 30 Minutes Intravenous  Once 04/26/16 1202 04/26/16 1259   04/26/16 1215  vancomycin (VANCOCIN) 2,000 mg in sodium chloride 0.9 % 500 mL IVPB     2,000 mg 250 mL/hr over 120 Minutes Intravenous  Once 04/26/16 1202 04/26/16 1801      Medications:  Scheduled: . atorvastatin  40 mg Oral q1800  . docusate sodium  100 mg Oral BID  . hydrochlorothiazide  25 mg Oral Daily  . insulin aspart  0-5 Units Subcutaneous QHS  . insulin aspart  0-9 Units Subcutaneous TID WC  . insulin glargine  40 Units Subcutaneous QHS  . losartan  50 mg Oral Daily  . senna  1 tablet Oral BID    Objective: Vital signs in last 24 hours: Temp:  [98.6 F (37 C)-100 F (37.8 C)] 98.6 F (37 C) (04/18 1316) Pulse Rate:  [105-118] 106 (04/18 1316) Resp:  [16-28] 18 (04/18 1316) BP: (116-146)/(67-90) 133/77 (04/18 1316) SpO2:  [97 %-100 %] 98 % (04/18 1316)   General appearance: alert, cooperative and no distress Resp: clear to auscultation bilaterally Cardio: regular rate and rhythm GI: normal findings: bowel sounds normal and  soft, non-tender Extremities: L BKA wound dressed, VAC.   Lab Results  Recent Labs  04/26/16 1009 04/27/16 0916  WBC 8.1 6.5  HGB 11.3* 10.7*  HCT 33.3* 31.9*  NA 133* 136  K 4.2 4.3  CL 99* 102  CO2 25 25  BUN 13 9  CREATININE 1.04 0.89   Liver Panel  Recent Labs  04/26/16 1009  PROT 7.1  ALBUMIN 3.2*  AST 13*  ALT 14*  ALKPHOS 94  BILITOT 0.8   Sedimentation Rate  Recent Labs  04/26/16 1220  ESRSEDRATE 114*   C-Reactive Protein  Recent Labs  04/26/16 1220  CRP 20.7*    Microbiology: Recent Results (from the past 240 hour(s))  Wound or Superficial Culture     Status: None (Preliminary result)   Collection Time: 04/26/16 12:15 PM  Result Value Ref Range Status   Specimen Description WOUND LEFT LEG  Final   Special Requests Normal  Final   Gram Stain   Final    ABUNDANT WBC PRESENT, PREDOMINANTLY PMN MODERATE GRAM POSITIVE COCCI IN CLUSTERS    Culture   Final    MODERATE STAPHYLOCOCCUS AUREUS SUSCEPTIBILITIES TO FOLLOW    Report Status PENDING  Incomplete  Culture, blood (Routine X 2) w  Reflex to ID Panel     Status: None (Preliminary result)   Collection Time: 04/26/16  1:55 PM  Result Value Ref Range Status   Specimen Description BLOOD RIGHT ANTECUBITAL  Final   Special Requests   Final    BOTTLES DRAWN AEROBIC AND ANAEROBIC Blood Culture adequate volume   Culture NO GROWTH < 24 HOURS  Final   Report Status PENDING  Incomplete  Culture, blood (Routine X 2) w Reflex to ID Panel     Status: None (Preliminary result)   Collection Time: 04/26/16  2:00 PM  Result Value Ref Range Status   Specimen Description BLOOD RIGHT HAND  Final   Special Requests   Final    BOTTLES DRAWN AEROBIC AND ANAEROBIC Blood Culture adequate volume   Culture NO GROWTH 1 DAY  Final   Report Status PENDING  Incomplete  Aerobic/Anaerobic Culture (surgical/deep wound)     Status: None (Preliminary result)   Collection Time: 04/26/16  3:25 PM  Result Value Ref Range  Status   Specimen Description ABSCESS LEFT LEG  Final   Special Requests NONE  Final   Gram Stain   Final    FEW WBC PRESENT, PREDOMINANTLY PMN RARE GRAM NEGATIVE RODS RARE GRAM POSITIVE COCCI IN PAIRS    Culture   Final    FEW STAPHYLOCOCCUS AUREUS SUSCEPTIBILITIES TO FOLLOW    Report Status PENDING  Incomplete    Studies/Results: Dg Tibia/fibula Left  Result Date: 04/26/2016 CLINICAL DATA:  Skin ulceration.  Amputation 4 months ago. EXAM: LEFT TIBIA AND FIBULA - 2 VIEW COMPARISON:  None. FINDINGS: The bony structures are intact. The osteotomy sites appear sharp. No destructive bony changes that would suggest osteomyelitis. The knee joint is maintained. Extensive vascular calcifications are noted. IMPRESSION: Status post below-knee amputation. No findings suspicious for osteomyelitis. Electronically Signed   By: Rudie Meyer M.D.   On: 04/26/2016 11:21     Assessment/Plan: L BKA Wound infection  DM1  Will change to vanco alone while we await final sensi A1C 8.4%- needs better control Hep C (-) Would place PIC- explained to pt  Total days of antibiotics: 1 vanco/cefepime         Johny Sax Infectious Diseases (pager) [phone] [email or web address] 04/27/2016, 3:42 PM  LOS: 1 day

## 2016-04-27 NOTE — Progress Notes (Addendum)
Consults/PROGRESS NOTE    Michael Hutchinson  QIO:[id number] DOB: [birth year] DOA: 04/26/2016 PCP: Thomasene Lot, DO    Brief Narrative:  Patient is a 41 year old gentleman history of type 1 diabetes, hypertension, hyperlipidemia who was admitted under the orthopedic service for wound debridement of left BKA surgical site.   Assessment & Plan:   Principal Problem:   Abscess of left leg Active Problems:   Diabetes mellitus type 1 (HCC)   Essential hypertension   Hyperlipidemia   H/O noncompliance with medical treatment, presenting hazards to health   Diabetes mellitus with complication (HCC)  #1 left leg wound abscess/status post left BKA Patient status post irrigation and excisional debridement of left lower extremity deep abscess with application of wound VAC to the leg. Cultures were taken data pending. Blood cultures pending. Preliminary wound cultures growing staph aureus. Patient on IV vancomycin and IV cefepime. ID following and antibiotics have been narrowed down to IV vancomycin. Per ID and per primary team.  #2 diabetes mellitus 1 Hemoglobin A1c 8.4 from 11.5 on 12/31/2015. Patient congratulated on improvement in his hemoglobin A1c. CBGs have ranged from 86-152. Continue current dose of Lantus and sliding scale insulin. Follow.  #3 hypertension Stable. Continue Cozaar and HCTZ.  #4 hyperlipidemia Check a fasting lipid panel. Continue statin.  #5 headache Give a dose of Reglan 10 mg IV 1 and Toradol 30 mg IV 1.    DVT prophylaxis: Per primary team. Code Status: Full Family Communication: Updated patient and family at bedside. Disposition Plan: Home when medically stable and per primary team.   Consultants:   [facility] hospitalist: Dr. Margot Ables 04/26/2016  Infectious diseases: Dr. Ninetta Lights 04/26/2016  Procedures:   Irrigation and excisional debridement of LLE deep abscess with application of wound VAC to leg wound 4 cm x 2 cm x 2 cm per Dr. Victorino Dike  04/26/2016  Plain films of the left tib-fib 04/26/2016    Antimicrobials:   IV cefepime 04/26/2016>>> 04/27/2016  IV vancomycin 04/26/2016   Subjective: In bed. No chest pain. No shortness of breath. Complaining of a bad headache.  Objective: Vitals:   04/27/16 0219 04/27/16 0548 04/27/16 0958 04/27/16 1316  BP: 118/78 130/80 134/81 133/77  Pulse: (!) 110 (!) 108 (!) 105 (!) 106  Resp: 18 17 17 18   Temp: 98.7 F (37.1 C) 98.7 F (37.1 C) 98.7 F (37.1 C) 98.6 F (37 C)  TempSrc: Oral Oral Oral Oral  SpO2: 98% 97% 98% 98%  Weight:      Height:        Intake/Output Summary (Last 24 hours) at 04/27/16 1413 Last data filed at 04/27/16 0549  Gross per 24 hour  Intake             1527 ml  Output             1610 ml  Net              -83 ml   Filed Weights   04/26/16 1003  Weight: 135.2 kg (298 lb)    Examination:  General exam: Appears calm and comfortable  Respiratory system: Clear to auscultation. Respiratory effort normal. Cardiovascular system: S1 & S2 heard, RRR. No JVD, murmurs, rubs, gallops or clicks. No pedal edema. Gastrointestinal system: Abdomen is nondistended, soft and nontender. No organomegaly or masses felt. Normal bowel sounds heard. Central nervous system: Alert and oriented. No focal neurological deficits. Extremities: Status post left BKA with Bandage wrapped around stump and wound VAC in place. Skin: No rashes,  lesions or ulcers Psychiatry: Judgement and insight appear normal. Mood & affect appropriate.     Data Reviewed: I have personally reviewed following labs and imaging studies  CBC:  Recent Labs Lab 04/26/16 1009 04/27/16 0916  WBC 8.1 6.5  NEUTROABS 6.4 4.8  HGB 11.3* 10.7*  HCT 33.3* 31.9*  MCV 85.4 87.4  PLT 292 256   Basic Metabolic Panel:  Recent Labs Lab 04/26/16 1009 04/27/16 0916  NA 133* 136  K 4.2 4.3  CL 99* 102  CO2 25 25  GLUCOSE 250* 118*  BUN 13 9  CREATININE 1.04 0.89  CALCIUM 8.8* 8.4*  MG   --  1.9   GFR: Estimated Creatinine Clearance: 160.1 mL/min (by C-G formula based on SCr of 0.89 mg/dL). Liver Function Tests:  Recent Labs Lab 04/26/16 1009  AST 13*  ALT 14*  ALKPHOS 94  BILITOT 0.8  PROT 7.1  ALBUMIN 3.2*   No results for input(s): LIPASE, AMYLASE in the last 168 hours. No results for input(s): AMMONIA in the last 168 hours. Coagulation Profile: No results for input(s): INR, PROTIME in the last 168 hours. Cardiac Enzymes: No results for input(s): CKTOTAL, CKMB, CKMBINDEX, TROPONINI in the last 168 hours. BNP (last 3 results) No results for input(s): PROBNP in the last 8760 hours. HbA1C:  Recent Labs  04/26/16 1328  HGBA1C 8.4*   CBG:  Recent Labs Lab 04/26/16 1728 04/26/16 2154 04/27/16 0540 04/27/16 0801 04/27/16 1235  GLUCAP 221* 227* 86 92 152*   Lipid Profile: No results for input(s): CHOL, HDL, LDLCALC, TRIG, CHOLHDL, LDLDIRECT in the last 72 hours. Thyroid Function Tests: No results for input(s): TSH, T4TOTAL, FREET4, T3FREE, THYROIDAB in the last 72 hours. Anemia Panel: No results for input(s): VITAMINB12, FOLATE, FERRITIN, TIBC, IRON, RETICCTPCT in the last 72 hours. Sepsis Labs:  Recent Labs Lab 04/26/16 1023 04/26/16 1403  LATICACIDVEN 1.56 0.95    Recent Results (from the past 240 hour(s))  Wound or Superficial Culture     Status: None (Preliminary result)   Collection Time: 04/26/16 12:15 PM  Result Value Ref Range Status   Specimen Description WOUND LEFT LEG  Final   Special Requests Normal  Final   Gram Stain   Final    ABUNDANT WBC PRESENT, PREDOMINANTLY PMN MODERATE GRAM POSITIVE COCCI IN CLUSTERS    Culture   Final    MODERATE STAPHYLOCOCCUS AUREUS SUSCEPTIBILITIES TO FOLLOW    Report Status PENDING  Incomplete  Aerobic/Anaerobic Culture (surgical/deep wound)     Status: None (Preliminary result)   Collection Time: 04/26/16  3:25 PM  Result Value Ref Range Status   Specimen Description ABSCESS LEFT LEG   Final   Special Requests NONE  Final   Gram Stain   Final    FEW WBC PRESENT, PREDOMINANTLY PMN RARE GRAM NEGATIVE RODS RARE GRAM POSITIVE COCCI IN PAIRS    Culture PENDING  Incomplete   Report Status PENDING  Incomplete         Radiology Studies: Dg Tibia/fibula Left  Result Date: 04/26/2016 CLINICAL DATA:  Skin ulceration.  Amputation 4 months ago. EXAM: LEFT TIBIA AND FIBULA - 2 VIEW COMPARISON:  None. FINDINGS: The bony structures are intact. The osteotomy sites appear sharp. No destructive bony changes that would suggest osteomyelitis. The knee joint is maintained. Extensive vascular calcifications are noted. IMPRESSION: Status post below-knee amputation. No findings suspicious for osteomyelitis. Electronically Signed   By: Rudie Meyer M.D.   On: 04/26/2016 11:21  Scheduled Meds: . atorvastatin  40 mg Oral q1800  . docusate sodium  100 mg Oral BID  . hydrochlorothiazide  25 mg Oral Daily  . insulin aspart  0-5 Units Subcutaneous QHS  . insulin aspart  0-9 Units Subcutaneous TID WC  . insulin glargine  40 Units Subcutaneous QHS  . losartan  50 mg Oral Daily  . senna  1 tablet Oral BID   Continuous Infusions: . sodium chloride 75 mL/hr at 04/26/16 2130  . ceFEPime (MAXIPIME) IV Stopped (04/27/16 8756)  . vancomycin Stopped (04/27/16 0800)     LOS: 1 day    Time spent: 35 minutes    Jewelz Kobus, MD [facility] Pager [phone]  If 7PM-7AM, please contact night-coverage [email or web address] Password [email or web address] 04/27/2016, 2:13 PM

## 2016-04-27 NOTE — Progress Notes (Signed)
Inpatient Diabetes Program Recommendations  AACE/ADA: New Consensus Statement on Inpatient Glycemic Control (2015)  Target Ranges:  Prepandial:   less than 140 mg/dL      Peak postprandial:   less than 180 mg/dL (1-2 hours)      Critically ill patients:  140 - 180 mg/dL   Review of Glycemic Control  Diabetes history: DM 1 Outpatient Diabetes medications: Lantus 40 units, Humalog 3-20 units SSI and Carb coverage (4 units for 30 gm of carbs) Current orders for Inpatient glycemic control: Lantus 40 units, Novolog Sensitive Correction + HS scale   Consult for medical non compliance  A1c 12/31/15 11.5 % A1c 04/26/16  8.4% much improved  I&D early this am. Will follow patient and glucose trends while inpatient.  Thanks,  Christena Deem RN, MSN, [facility] Inpatient Diabetes Coordinator Team Pager [phone] (8a-5p)

## 2016-04-28 DIAGNOSIS — M868X6 Other osteomyelitis, lower leg: Secondary | ICD-10-CM

## 2016-04-28 DIAGNOSIS — E1021 Type 1 diabetes mellitus with diabetic nephropathy: Secondary | ICD-10-CM

## 2016-04-28 DIAGNOSIS — Z95828 Presence of other vascular implants and grafts: Secondary | ICD-10-CM

## 2016-04-28 LAB — BASIC METABOLIC PANEL
Anion gap: 6 (ref 5–15)
BUN: 7 mg/dL (ref 6–20)
CALCIUM: 8.7 mg/dL — AB (ref 8.9–10.3)
CHLORIDE: 101 mmol/L (ref 101–111)
CO2: 28 mmol/L (ref 22–32)
CREATININE: 0.9 mg/dL (ref 0.61–1.24)
GFR calc non Af Amer: >60 mL/min (ref >60)
Glucose, Bld: 187 mg/dL — ABNORMAL HIGH (ref 65–99)
Potassium: 4 mmol/L (ref 3.5–5.1)
SODIUM: 135 mmol/L (ref 135–145)

## 2016-04-28 LAB — AEROBIC CULTURE  (SUPERFICIAL SPECIMEN): SPECIAL REQUESTS: NORMAL

## 2016-04-28 LAB — GLUCOSE, CAPILLARY
GLUCOSE-CAPILLARY: 139 mg/dL — AB (ref 65–99)
GLUCOSE-CAPILLARY: 194 mg/dL — AB (ref 65–99)
Glucose-Capillary: 140 mg/dL — ABNORMAL HIGH (ref 65–99)
Glucose-Capillary: 253 mg/dL — ABNORMAL HIGH (ref 65–99)

## 2016-04-28 LAB — AEROBIC CULTURE W GRAM STAIN (SUPERFICIAL SPECIMEN)

## 2016-04-28 LAB — VANCOMYCIN, TROUGH: Vancomycin Tr: 28 ug/mL (ref 15–20)

## 2016-04-28 MED ORDER — VANCOMYCIN HCL 10 G IV SOLR
1250.0000 mg | Freq: Two times a day (BID) | INTRAVENOUS | Status: DC
  Administered 2016-04-29: 1250 mg via INTRAVENOUS
  Filled 2016-04-28: qty 1250

## 2016-04-28 MED ORDER — SODIUM CHLORIDE 0.9% FLUSH: 10.0000 mL | INTRAVENOUS | Status: DC | PRN

## 2016-04-28 NOTE — Care Management Note (Addendum)
Case Management Note  Patient Details  Name: [name] MRN: [id number] Date of Birth: [birth year]  Subjective/Objective:                    Action/Plan: [facility] will provide HHRN and infusion. [facility] VAC will be delivered to patient's hospital room tomorrow morning. VAC application faxed . Will need HHRN order Expected Discharge Date:                  Expected Discharge Plan:  Home w Home Health Services  In-House Referral:     Discharge planning Services  CM Consult  Post Acute Care Choice:    Choice offered to:  Patient, Spouse  DME Arranged:  Vac DME Agency:  [facility]  HH Arranged:  RN HH Agency:  [facility], [facility]  Status of Service:  In process, will continue to follow  If discussed at Long Length of Stay Meetings, dates discussed:    Additional Comments:  Kingsley Plan, RN 04/28/2016, 12:51 PM

## 2016-04-28 NOTE — Progress Notes (Signed)
INFECTIOUS DISEASE PROGRESS NOTE  ID: Michael Hutchinson is a 41 y.o. male with  Principal Problem:   Abscess of left leg Active Problems:   Diabetes mellitus type 1 ([name])   Essential hypertension   Hyperlipidemia   H/O noncompliance with medical treatment, presenting hazards to health   Diabetes mellitus with complication ([name])  Subjective: Without complaints.   Abtx:  Anti-infectives    Start     Dose/Rate Route Frequency Ordered Stop   04/26/16 2230  vancomycin (VANCOCIN) 1,250 mg in sodium chloride 0.9 % 250 mL IVPB     1,250 mg 166.7 mL/hr over 90 Minutes Intravenous Every 8 hours 04/26/16 1233     04/26/16 2200  ceFEPIme (MAXIPIME) 1 g in dextrose 5 % 50 mL IVPB  Status:  Discontinued     1 g 100 mL/hr over 30 Minutes Intravenous Every 8 hours 04/26/16 1233 04/27/16 1545   04/26/16 1215  ceFEPIme (MAXIPIME) 2 g in dextrose 5 % 50 mL IVPB     2 g 100 mL/hr over 30 Minutes Intravenous  Once 04/26/16 1202 04/26/16 1259   04/26/16 1215  vancomycin (VANCOCIN) 2,000 mg in sodium chloride 0.9 % 500 mL IVPB     2,000 mg 250 mL/hr over 120 Minutes Intravenous  Once 04/26/16 1202 04/26/16 1801      Medications:  Scheduled: . atorvastatin  40 mg Oral q1800  . docusate sodium  100 mg Oral BID  . hydrochlorothiazide  25 mg Oral Daily  . insulin aspart  0-5 Units Subcutaneous QHS  . insulin aspart  0-9 Units Subcutaneous TID WC  . insulin glargine  40 Units Subcutaneous QHS  . losartan  50 mg Oral Daily  . senna  1 tablet Oral BID    Objective: Vital signs in last 24 hours: Temp:  [98.1 F (36.7 C)-99 F (37.2 C)] 99 F (37.2 C) (04/19 1456) Pulse Rate:  [101-106] 106 (04/19 1456) Resp:  [16-18] 18 (04/19 1456) BP: (127-142)/(81-96) 127/87 (04/19 1456) SpO2:  [97 %-98 %] 98 % (04/19 1456)   General appearance: alert, cooperative and no distress Extremities: LUE PIC Incision/Wound: L stump is dressed, vac in place.   Lab Results  Recent Labs  04/26/16 1009  04/27/16 0916  WBC 8.1 6.5  HGB 11.3* 10.7*  HCT 33.3* 31.9*  NA 133* 136  K 4.2 4.3  CL 99* 102  CO2 25 25  BUN 13 9  CREATININE 1.04 0.89   Liver Panel  Recent Labs  04/26/16 1009  PROT 7.1  ALBUMIN 3.2*  AST 13*  ALT 14*  ALKPHOS 94  BILITOT 0.8   Sedimentation Rate  Recent Labs  04/26/16 1220  ESRSEDRATE 114*   C-Reactive Protein  Recent Labs  04/26/16 1220  CRP 20.7*    Microbiology: Recent Results (from the past 240 hour(s))  Wound or Superficial Culture     Status: None   Collection Time: 04/26/16 12:15 PM  Result Value Ref Range Status   Specimen Description WOUND LEFT LEG  Final   Special Requests Normal  Final   Gram Stain   Final    ABUNDANT WBC PRESENT, PREDOMINANTLY PMN MODERATE GRAM POSITIVE COCCI IN CLUSTERS    Culture   Final    MODERATE METHICILLIN RESISTANT STAPHYLOCOCCUS AUREUS   Report Status 04/28/2016 FINAL  Final   Organism ID, Bacteria METHICILLIN RESISTANT STAPHYLOCOCCUS AUREUS  Final      Susceptibility   Methicillin resistant staphylococcus aureus - MIC*    CIPROFLOXACIN >=  8 RESISTANT Resistant     ERYTHROMYCIN >=8 RESISTANT Resistant     GENTAMICIN <=0.5 SENSITIVE Sensitive     OXACILLIN >=4 RESISTANT Resistant     TETRACYCLINE <=1 SENSITIVE Sensitive     VANCOMYCIN <=0.5 SENSITIVE Sensitive     TRIMETH/SULFA <=10 SENSITIVE Sensitive     CLINDAMYCIN <=0.25 SENSITIVE Sensitive     RIFAMPIN <=0.5 SENSITIVE Sensitive     Inducible Clindamycin NEGATIVE Sensitive     * MODERATE METHICILLIN RESISTANT STAPHYLOCOCCUS AUREUS  Culture, blood (Routine X 2) w Reflex to ID Panel     Status: None (Preliminary result)   Collection Time: 04/26/16  1:55 PM  Result Value Ref Range Status   Specimen Description BLOOD RIGHT ANTECUBITAL  Final   Special Requests   Final    BOTTLES DRAWN AEROBIC AND ANAEROBIC Blood Culture adequate volume   Culture NO GROWTH 2 DAYS  Final   Report Status PENDING  Incomplete  Culture, blood (Routine X 2)  w Reflex to ID Panel     Status: None (Preliminary result)   Collection Time: 04/26/16  2:00 PM  Result Value Ref Range Status   Specimen Description BLOOD RIGHT HAND  Final   Special Requests   Final    BOTTLES DRAWN AEROBIC AND ANAEROBIC Blood Culture adequate volume   Culture NO GROWTH 2 DAYS  Final   Report Status PENDING  Incomplete  Aerobic/Anaerobic Culture (surgical/deep wound)     Status: None (Preliminary result)   Collection Time: 04/26/16  3:25 PM  Result Value Ref Range Status   Specimen Description ABSCESS LEFT LEG  Final   Special Requests NONE  Final   Gram Stain   Final    FEW WBC PRESENT, PREDOMINANTLY PMN RARE GRAM NEGATIVE RODS RARE GRAM POSITIVE COCCI IN PAIRS    Culture   Final    FEW METHICILLIN RESISTANT STAPHYLOCOCCUS AUREUS NO ANAEROBES ISOLATED; CULTURE IN PROGRESS FOR 5 DAYS    Report Status PENDING  Incomplete   Organism ID, Bacteria METHICILLIN RESISTANT STAPHYLOCOCCUS AUREUS  Final      Susceptibility   Methicillin resistant staphylococcus aureus - MIC*    CIPROFLOXACIN >=8 RESISTANT Resistant     ERYTHROMYCIN >=8 RESISTANT Resistant     GENTAMICIN <=0.5 SENSITIVE Sensitive     OXACILLIN >=4 RESISTANT Resistant     TETRACYCLINE <=1 SENSITIVE Sensitive     VANCOMYCIN 1 SENSITIVE Sensitive     TRIMETH/SULFA <=10 SENSITIVE Sensitive     CLINDAMYCIN <=0.25 SENSITIVE Sensitive     RIFAMPIN <=0.5 SENSITIVE Sensitive     Inducible Clindamycin NEGATIVE Sensitive     * FEW METHICILLIN RESISTANT STAPHYLOCOCCUS AUREUS    Studies/Results: No results found.   Assessment/Plan: L BKA Wound infection, osteomyelitis  DM1  Total days of antibiotics: 2 vanco  Would aim for 42 days of vanco Diabetic control better in hospital (although he has fast food and sodas on his tray) He has [facility] opat consult Will see him in clinic in 4-5 weeks.    Allergies  Allergen Reactions  . Influenza Vac Split Quad Nausea And Vomiting    Discharge  antibiotics: Vancomycin Per pharmacy protocol  Aim for Vancomycin trough 15-20 (unless otherwise indicated) Duration: 40 days End Date: June 16, 2016  [facility] Care Per Protocol:  Labs weekly while on IV antibiotics: _x_ CBC with differential __ BMP _x_ CMP _x_ CRP _x_ ESR _x_ Vancomycin trough  _x_ Please pull PIC at completion of IV antibiotics __ Please leave PIC in place  until doctor has seen patient or been notified  Fax weekly labs to [phone] [place] 4-6 weeks.           Bobby Rumpf Infectious Diseases (pager) [phone] [email or web address] 04/28/2016, 6:15 PM  LOS: 2 days

## 2016-04-28 NOTE — Progress Notes (Signed)
Consults/PROGRESS NOTE    Michael Hutchinson  WJX:[id number] DOB: 1975-07-14 DOA: 04/26/2016 PCP: Thomasene Lot, DO    Brief Narrative:  Patient is a 41 year old gentleman history of type 1 diabetes, hypertension, hyperlipidemia who was admitted under the orthopedic service for wound debridement of left BKA surgical site.   Assessment & Plan:   Principal Problem:   Abscess of left leg Active Problems:   Diabetes mellitus type 1 (HCC)   Essential hypertension   Hyperlipidemia   H/O noncompliance with medical treatment, presenting hazards to health   Diabetes mellitus with complication (HCC)  #1 left leg wound abscess/status post left BKA Patient status post irrigation and excisional debridement of left lower extremity deep abscess with application of wound VAC to the leg. Cultures were taken and are consistent with MRSA .blood cultures were no growth to date. PICC line has been placed. IV cefepime discontinued and patient on IV vancomycin D2/42 per ID recommendations. Per ID and per primary team.  #2 diabetes mellitus 1 Hemoglobin A1c 8.4 from 11.5 on 12/31/2015. Patient congratulated on improvement in his hemoglobin A1c. CBGs have ranged from 139-253. Continue current dose of Lantus and sliding scale insulin. Follow.  #3 hypertension Stable. Continue Cozaar and HCTZ.  #4 hyperlipidemia Fasting lipid panel with LDL 76 09/15/2015. Continue statin.   #5 headache Improved with IV Reglan and Toradol.     DVT prophylaxis: Per primary team. Code Status: Full Family Communication: Updated patient and family at bedside. Disposition Plan: Home when medically stable and per primary team, hopefully tomorrow.   Consultants:   [facility] hospitalist: Dr. Margot Ables 04/26/2016  Infectious diseases: Dr. Ninetta Lights 04/26/2016  Procedures:   Irrigation and excisional debridement of LLE deep abscess with application of wound VAC to leg wound 4 cm x 2 cm x 2 cm per Dr. Victorino Dike 04/26/2016  Plain  films of the left tib-fib 04/26/2016  PICC line 04/27/2016  Antimicrobials:   IV cefepime 04/26/2016>>> 04/27/2016  IV vancomycin 04/26/2016   Subjective: In bed. No chest pain. No shortness of breath. Headache improved.  Objective: Vitals:   04/27/16 1316 04/27/16 2112 04/28/16 0600 04/28/16 1127  BP: 133/77 (!) 142/96 (!) 142/87 (!) 141/81  Pulse: (!) 106 (!) 101 (!) 105 (!) 102  Resp: 18 16 16 18   Temp: 98.6 F (37 C) 98.1 F (36.7 C) 98.7 F (37.1 C) 98.6 F (37 C)  TempSrc: Oral Oral Oral Oral  SpO2: 98% 98% 98% 97%  Weight:      Height:        Intake/Output Summary (Last 24 hours) at 04/28/16 1252 Last data filed at 04/28/16 1036  Gross per 24 hour  Intake           3312.5 ml  Output             2110 ml  Net           1202.5 ml   Filed Weights   04/26/16 1003  Weight: 135.2 kg (298 lb)    Examination:  General exam: Appears calm and comfortable  Respiratory system: Clear to auscultation. Respiratory effort normal. Cardiovascular system: S1 & S2 heard, RRR. No JVD, murmurs, rubs, gallops or clicks. No pedal edema. Gastrointestinal system: Abdomen is nondistended, soft and nontender. No organomegaly or masses felt. Normal bowel sounds heard. Central nervous system: Alert and oriented. No focal neurological deficits. Extremities: Status post left BKA with Bandage wrapped around stump and wound VAC in place. Skin: No rashes, lesions or ulcers Psychiatry: Judgement and  insight appear normal. Mood & affect appropriate.     Data Reviewed: I have personally reviewed following labs and imaging studies  CBC:  Recent Labs Lab 04/26/16 1009 04/27/16 0916  WBC 8.1 6.5  NEUTROABS 6.4 4.8  HGB 11.3* 10.7*  HCT 33.3* 31.9*  MCV 85.4 87.4  PLT 292 256   Basic Metabolic Panel:  Recent Labs Lab 04/26/16 1009 04/27/16 0916  NA 133* 136  K 4.2 4.3  CL 99* 102  CO2 25 25  GLUCOSE 250* 118*  BUN 13 9  CREATININE 1.04 0.89  CALCIUM 8.8* 8.4*  MG  --   1.9   GFR: Estimated Creatinine Clearance: 160.1 mL/min (by C-G formula based on SCr of 0.89 mg/dL). Liver Function Tests:  Recent Labs Lab 04/26/16 1009  AST 13*  ALT 14*  ALKPHOS 94  BILITOT 0.8  PROT 7.1  ALBUMIN 3.2*   No results for input(s): LIPASE, AMYLASE in the last 168 hours. No results for input(s): AMMONIA in the last 168 hours. Coagulation Profile: No results for input(s): INR, PROTIME in the last 168 hours. Cardiac Enzymes: No results for input(s): CKTOTAL, CKMB, CKMBINDEX, TROPONINI in the last 168 hours. BNP (last 3 results) No results for input(s): PROBNP in the last 8760 hours. HbA1C:  Recent Labs  04/26/16 1328  HGBA1C 8.4*   CBG:  Recent Labs Lab 04/27/16 1235 04/27/16 1704 04/27/16 2137 04/28/16 0740 04/28/16 1143  GLUCAP 152* 128* 151* 140* 139*   Lipid Profile: No results for input(s): CHOL, HDL, LDLCALC, TRIG, CHOLHDL, LDLDIRECT in the last 72 hours. Thyroid Function Tests: No results for input(s): TSH, T4TOTAL, FREET4, T3FREE, THYROIDAB in the last 72 hours. Anemia Panel: No results for input(s): VITAMINB12, FOLATE, FERRITIN, TIBC, IRON, RETICCTPCT in the last 72 hours. Sepsis Labs:  Recent Labs Lab 04/26/16 1023 04/26/16 1403  LATICACIDVEN 1.56 0.95    Recent Results (from the past 240 hour(s))  Wound or Superficial Culture     Status: None   Collection Time: 04/26/16 12:15 PM  Result Value Ref Range Status   Specimen Description WOUND LEFT LEG  Final   Special Requests Normal  Final   Gram Stain   Final    ABUNDANT WBC PRESENT, PREDOMINANTLY PMN MODERATE GRAM POSITIVE COCCI IN CLUSTERS    Culture   Final    MODERATE METHICILLIN RESISTANT STAPHYLOCOCCUS AUREUS   Report Status 04/28/2016 FINAL  Final   Organism ID, Bacteria METHICILLIN RESISTANT STAPHYLOCOCCUS AUREUS  Final      Susceptibility   Methicillin resistant staphylococcus aureus - MIC*    CIPROFLOXACIN >=8 RESISTANT Resistant     ERYTHROMYCIN >=8 RESISTANT  Resistant     GENTAMICIN <=0.5 SENSITIVE Sensitive     OXACILLIN >=4 RESISTANT Resistant     TETRACYCLINE <=1 SENSITIVE Sensitive     VANCOMYCIN <=0.5 SENSITIVE Sensitive     TRIMETH/SULFA <=10 SENSITIVE Sensitive     CLINDAMYCIN <=0.25 SENSITIVE Sensitive     RIFAMPIN <=0.5 SENSITIVE Sensitive     Inducible Clindamycin NEGATIVE Sensitive     * MODERATE METHICILLIN RESISTANT STAPHYLOCOCCUS AUREUS  Culture, blood (Routine X 2) w Reflex to ID Panel     Status: None (Preliminary result)   Collection Time: 04/26/16  1:55 PM  Result Value Ref Range Status   Specimen Description BLOOD RIGHT ANTECUBITAL  Final   Special Requests   Final    BOTTLES DRAWN AEROBIC AND ANAEROBIC Blood Culture adequate volume   Culture NO GROWTH 2 DAYS  Final   Report  Status PENDING  Incomplete  Culture, blood (Routine X 2) w Reflex to ID Panel     Status: None (Preliminary result)   Collection Time: 04/26/16  2:00 PM  Result Value Ref Range Status   Specimen Description BLOOD RIGHT HAND  Final   Special Requests   Final    BOTTLES DRAWN AEROBIC AND ANAEROBIC Blood Culture adequate volume   Culture NO GROWTH 2 DAYS  Final   Report Status PENDING  Incomplete  Aerobic/Anaerobic Culture (surgical/deep wound)     Status: None (Preliminary result)   Collection Time: 04/26/16  3:25 PM  Result Value Ref Range Status   Specimen Description ABSCESS LEFT LEG  Final   Special Requests NONE  Final   Gram Stain   Final    FEW WBC PRESENT, PREDOMINANTLY PMN RARE GRAM NEGATIVE RODS RARE GRAM POSITIVE COCCI IN PAIRS    Culture   Final    FEW METHICILLIN RESISTANT STAPHYLOCOCCUS AUREUS NO ANAEROBES ISOLATED; CULTURE IN PROGRESS FOR 5 DAYS    Report Status PENDING  Incomplete   Organism ID, Bacteria METHICILLIN RESISTANT STAPHYLOCOCCUS AUREUS  Final      Susceptibility   Methicillin resistant staphylococcus aureus - MIC*    CIPROFLOXACIN >=8 RESISTANT Resistant     ERYTHROMYCIN >=8 RESISTANT Resistant     GENTAMICIN  <=0.5 SENSITIVE Sensitive     OXACILLIN >=4 RESISTANT Resistant     TETRACYCLINE <=1 SENSITIVE Sensitive     VANCOMYCIN 1 SENSITIVE Sensitive     TRIMETH/SULFA <=10 SENSITIVE Sensitive     CLINDAMYCIN <=0.25 SENSITIVE Sensitive     RIFAMPIN <=0.5 SENSITIVE Sensitive     Inducible Clindamycin NEGATIVE Sensitive     * FEW METHICILLIN RESISTANT STAPHYLOCOCCUS AUREUS         Radiology Studies: No results found.      Scheduled Meds: . atorvastatin  40 mg Oral q1800  . docusate sodium  100 mg Oral BID  . hydrochlorothiazide  25 mg Oral Daily  . insulin aspart  0-5 Units Subcutaneous QHS  . insulin aspart  0-9 Units Subcutaneous TID WC  . insulin glargine  40 Units Subcutaneous QHS  . losartan  50 mg Oral Daily  . senna  1 tablet Oral BID   Continuous Infusions: . sodium chloride 75 mL/hr at 04/28/16 1040  . vancomycin Stopped (04/28/16 0741)     LOS: 2 days    Time spent: 35 minutes    Nadirah Socorro, MD [facility] Pager [phone]  If 7PM-7AM, please contact night-coverage [email or web address] Password [facility] 04/28/2016, 12:52 PM

## 2016-04-28 NOTE — Progress Notes (Signed)
[facility]  New pt for [facility] this hospital admission  [facility] will be providing HHRN  To support home IV ABX and [facility] management. [facility] team will provide and support home IV Vancomycin management and monitoring.  [facility] is prepared for DC on 04-29-16 to support admission to our home care services.    If patient discharges after hours, please call [phone].   Sedalia Muta 04/28/2016, 3:17 PM

## 2016-04-28 NOTE — Discharge Summary (Signed)
Physician Discharge Summary  Patient ID: Michael Hutchinson MRN: [id number] DOB/AGE: 1975/01/26 41 y.o.  Admit date: 04/26/2016 Discharge date: 04/29/2016 Admission Diagnoses: L BKA stump abscess; DM type 1; HTN; hyperlipidemia; GERD; hx of charcot foot; hx of L BKA; morbid obesity; vit D deficiency; hx of diabetic ketoacidosis; hx of MRSA infection; hx of osteomyelitis; hx of diabetic foot ulcer; hx of normocytic anemia  Discharge Diagnoses:  Principal Problem:   Abscess of left leg Active Problems:   Diabetes mellitus type 1 (HCC)   Essential hypertension   Hyperlipidemia   H/O noncompliance with medical treatment, presenting hazards to health   Diabetes mellitus with complication [facility]) as stated above  Discharged Condition: stable  Hospital Course: Patient presented to the [facility] ED on 04/26/2016 with spontaneous drainage from his L BKA stump.  He was 10 weeks S/P L BKA.  Prior to his admission he was started on keflex for cellulitis.  Dr. Toni Arthurs was consulted to the ED.  He was able to express purulent drainage from a 3 cm wound on the medial aspect of the otherwise healed incision site.  Cultures were obtained in the ED.  The patient was admitted to the hospital and taken to the OR for I&D of the L BKA stump abscess and application of wound vac by Dr. Toni Arthurs.  Intra-op cultures were obtained.  The patient tolerated the procedure well without complication.  Infectious Disease and the Medicine team were both consulted.  The patient was initially placed on IV vanc and cefepime per ID recommendation.  Cultures grew MRSA.  A PICC line was inserted.  The patient had a wound vac change on 04/29/16.  The patient will be D/C'd home on 04/29/16 with [facility] for wound vac changes and administration of IV vanc x 42 days total per ID recommendation.  The patient tolerated his stay well without complication.  Consults: ID and Internal Medicine  Significant Diagnostic Studies: labs: CBC with diff;  micro: intra-op cultures that grew MRSA  Treatments: IV hydration, antibiotics: Ancef, vancomycin and cefepime, cardiac meds: HCTZ, insulin: Humalog and Lantus, procedures: PICC line and surgery: as stated above  Discharge Exam: Blood pressure 130/85, pulse 99, temperature 98.8 F (37.1 C), temperature source Oral, resp. rate 19, height 6' (1.829 m), weight 135.2 kg (298 lb), SpO2 99 %. General: WDWN patient in NAD. Psych:  Appropriate mood and affect. Neuro:  A&O x 3, Moving all extremities, sensation intact to light touch HEENT:  EOMs intact Chest:  Even non-labored respirations Skin:  Wound vac changed. Incision clean, no purulence, no rashes or lesions Extremities: warm/dry, no edema, erythema, or echymosis.  No lymphadenopathy. Pulses: Femoral 2+ MSK:  ROM: TKE, MMT: patient is able to perform quad set   Disposition: 01-Home or Self Care  Discharge Instructions    Call MD / Call 911    Complete by:  As directed    If you experience chest pain or shortness of breath, CALL 911 and be transported to the hospital emergency room.  If you develope a fever above 101 F, pus (white drainage) or increased drainage or redness at the wound, or calf pain, call your surgeon's office.   Constipation Prevention    Complete by:  As directed    Drink plenty of fluids.  Prune juice may be helpful.  You may use a stool softener, such as Colace (over the counter) 100 mg twice a day.  Use MiraLax (over the counter) for constipation as needed.  Diet Carb Modified    Complete by:  As directed    Home infusion instructions [facility] May follow [facility] Dosing Protocol; May administer Cathflo as needed to maintain patency of vascular access device.; Flushing of vascular access device: per [facility] Protocol: 0.9% NaCl pre/post medica...    Complete by:  As directed    Instructions:  May follow [facility] Dosing Protocol   Instructions:  May administer Cathflo as needed to maintain patency of  vascular access device.   Instructions:  Flushing of vascular access device: per [facility] Protocol: 0.9% NaCl pre/post medication administration and prn patency; Heparin 100 u/ml, 5ml for implanted ports and Heparin 10u/ml, 5ml for all other central venous catheters.   Instructions:  May follow [facility] Anaphylaxis Protocol for First Dose Administration in the home: 0.9% NaCl at 25-50 ml/hr to maintain IV access for protocol meds. Epinephrine 0.3 ml IV/IM PRN and Benadryl 25-50 IV/IM PRN s/s of anaphylaxis.   Instructions:  [facility] Infusion Coordinator (RN) to assist per patient IV care needs in the home PRN.   Increase activity slowly as tolerated    Complete by:  As directed    Non weight bearing    Complete by:  As directed    Laterality:  left   Extremity:  Lower     Allergies as of 04/29/2016      Reactions   Influenza Vac Split Quad Nausea And Vomiting      Medication List    STOP taking these medications   cephALEXin 500 MG capsule Commonly known as:  KEFLEX     TAKE these medications   atorvastatin 40 MG tablet Commonly known as:  LIPITOR Take 1 tablet (40 mg total) by mouth daily at 6 PM. What changed:  when to take this   BAYER MICROLET LANCETS lancets Use as instructed to check 3 times daily.   BD PEN NEEDLE NANO U/F 32G X 4 MM Misc Generic drug:  Insulin Pen Needle 1 pen by Does not apply route 3 (three) times daily.   glucagon 1 MG injection Commonly known as:  GLUCAGON EMERGENCY Inject 1 mg into the muscle once as needed.   glucose blood test strip Commonly known as:  BAYER CONTOUR NEXT TEST Use as instructed to check 3 times daily.   hydrochlorothiazide 25 MG tablet Commonly known as:  HYDRODIURIL Take 1 tablet (25 mg total) by mouth daily.   ibuprofen 800 MG tablet Commonly known as:  ADVIL,MOTRIN Take 1 tablet (800 mg total) by mouth 3 (three) times daily.   insulin glargine 100 UNIT/ML injection Commonly known as:  LANTUS Inject 40 units at  bedtime   insulin lispro 100 UNIT/ML injection Commonly known as:  HUMALOG Inject 3-20 Units into the skin 3 (three) times daily before meals. Sliding scale per carb count 4 units for every 30 carbs   losartan 50 MG tablet Commonly known as:  COZAAR Take 1 tablet (50 mg total) by mouth daily.   ONE TOUCH ULTRA SYSTEM KIT w/Device Kit 1 kit by Does not apply route once. Check blood sugar 3 times daily. Diagnosis Diabetes ICD-10 E11.8   traMADol 50 MG tablet Commonly known as:  ULTRAM Take 1 tablet (50 mg total) by mouth every 6 (six) hours as needed for moderate pain.   vancomycin IVPB Inject 1,250 mg into the vein every 12 (twelve) hours. Indication:  MRSA L leg abscess Last Day of Therapy:  42 days Labs - Sunday/Monday:  CBC/D, BMP,  and vancomycin trough. Labs - Thursday:  BMP and vancomycin trough Labs - Every other week:  ESR and CRP            Home Infusion Instuctions        Start     Ordered   04/29/16 0000  Home infusion instructions [facility] May follow [facility] Dosing Protocol; May administer Cathflo as needed to maintain patency of vascular access device.; Flushing of vascular access device: per [facility] Protocol: 0.9% NaCl pre/post medica...    Question Answer Comment  Instructions May follow [facility] Dosing Protocol   Instructions May administer Cathflo as needed to maintain patency of vascular access device.   Instructions Flushing of vascular access device: per [facility] Protocol: 0.9% NaCl pre/post medication administration and prn patency; Heparin 100 u/ml, 5ml for implanted ports and Heparin 10u/ml, 5ml for all other central venous catheters.   Instructions May follow [facility] Anaphylaxis Protocol for First Dose Administration in the home: 0.9% NaCl at 25-50 ml/hr to maintain IV access for protocol meds. Epinephrine 0.3 ml IV/IM PRN and Benadryl 25-50 IV/IM PRN s/s of anaphylaxis.   Instructions [facility] Infusion Coordinator (RN) to assist per patient  IV care needs in the home PRN.      04/29/16 0731     Follow-up Information    Thomasene Lot, DO Follow up.   Specialty:  Family Medicine Contact information: [address] [place] [address] [phone]        Toni Arthurs, MD. Schedule an appointment as soon as possible for a visit in 2 week(s).   Specialty:  Orthopedic Surgery Contact information: [address] [place] [address] [phone]           Signed: Joana Reamer [facility] Orthopaedics Office:  [phone]

## 2016-04-28 NOTE — Progress Notes (Signed)
PHARMACY CONSULT NOTE FOR:  OUTPATIENT  PARENTERAL ANTIBIOTIC THERAPY (OPAT)  Indication: MRSA L leg abscess Regimen: Vanc 1250mg  IV q12h End date: 06/07/16  (**noted 06/16/16 per Dr. Moshe Cipro note, but Nicolette Bang, RPh spoke with him and confirmed x 40 more days so 06/07/16**).   IV antibiotic discharge orders are pended. To discharging provider:  please sign these orders via discharge navigator,  Select New Orders & click on the button choice - Manage This Unsigned Work.    Pharmacy Antibiotic Note  Michael Hutchinson is a 41 y.o. male admitted on 04/26/2016 with MRSA L leg abscess.  PT continues on Vancomycin (Day #3) - plan for 42 days of therapy. SCr remains stable.  Vanc trough 28 mcg/ml (SUPRAtherapeutic) on 1250mg  IV q8h  Plan: Change Vancomycin to 1250mg  IV q12h F/u renal fxn, C&S, clinical status and trough at new Css OPAT consult completed for discharge  Height: 6' (182.9 cm) Weight: 298 lb (135.2 kg) IBW/kg (Calculated) : 77.6  Temp (24hrs), Avg:98.7 F (37.1 C), Min:98.6 F (37 C), Max:99 F (37.2 C)   Recent Labs Lab 04/26/16 1009 04/26/16 1023 04/26/16 1403 04/27/16 0916 04/28/16 2227  WBC 8.1  --   --  6.5  --   CREATININE 1.04  --   --  0.89 0.90  LATICACIDVEN  --  1.56 0.95  --   --   VANCOTROUGH  --   --   --   --  28*    Estimated Creatinine Clearance: 158.4 mL/min (by C-G formula based on SCr of 0.9 mg/dL).    Allergies  Allergen Reactions  . Influenza Vac Split Quad Nausea And Vomiting    Antimicrobials this admission: Vanc 4/17>> Cefepime 4/17>> 4/18  Dose adjustments this admission: N/A  Microbiology results: 4/17 HepC Ab negative 4/17 L leg abscess: few MRSA 4/17 L wound: Mod MRSA 4/17 BCx: sent  Thank you for allowing pharmacy to be a part of this patient's care.  Lavonia Dana 04/28/2016 11:25 PM

## 2016-04-28 NOTE — Progress Notes (Signed)
Received Diabetes Coordinator consult.  Spoke with patient in his room about his diabetes.  Was diagnosed 36 years ago. Last saw his PCP about 4 months ago. States that he checks his blood sugars several times per day due to sliding scale insulin that he takes.Is able to get his insulin financially.  Currently taking Lantus 40 units daily, Humalog 3-20 units SS TID, and covers his CHO with 4 units/30 grams CHO TID with meals. HgbA1c has dropped from 11.4% in December, 2017 to 8.4% now.  Encouraged him to keep doing what he is doing to improve the A1C.  No questions per patient. Will continue to monitor blood sugars while in the hospital.   Smith Mince RN BSN CDE Diabetes Coordinator Pager: [phone]  8am-5pm

## 2016-04-28 NOTE — Progress Notes (Signed)
Peripherally Inserted Central Catheter/Midline Placement  The IV Nurse has discussed with the patient and/or persons authorized to consent for the patient, the purpose of this procedure and the potential benefits and risks involved with this procedure.  The benefits include less needle sticks, lab draws from the catheter, and the patient may be discharged home with the catheter. Risks include, but not limited to, infection, bleeding, blood clot (thrombus formation), and puncture of an artery; nerve damage and irregular heartbeat and possibility to perform a PICC exchange if needed/ordered by physician.  Alternatives to this procedure were also discussed.  Bard Power PICC patient education guide, fact sheet on infection prevention and patient information card has been provided to patient /or left at bedside.    PICC/Midline Placement Documentation  PICC Single Lumen 04/28/16 PICC Right Basilic 46 cm 0 cm (Active)  Indication for Insertion or Continuance of Line Home intravenous therapies (PICC only) 04/28/2016  9:00 AM  Exposed Catheter (cm) 0 cm 04/28/2016  9:00 AM  Dressing Change Due 05/05/16 04/28/2016  9:00 AM       Stacie Glaze Horton 04/28/2016, 9:50 AM

## 2016-04-28 NOTE — Progress Notes (Signed)
Subjective: 2 Days Post-Op Procedure(s) (LRB): IRRIGATION AND DEBRIDEMENT EXTREMITY/Left Leg/Possible Wound Vac (Left)  Patient reports pain as mild to moderate.  Tolerating POs well.  Admits to flatulence.  Denies fever, chills, N/V, SOB, CP.  Objective:   VITALS:  Temp:  [98.1 F (36.7 C)-98.7 F (37.1 C)] 98.7 F (37.1 C) (04/19 0600) Pulse Rate:  [101-106] 105 (04/19 0600) Resp:  [16-18] 16 (04/19 0600) BP: (133-142)/(77-96) 142/87 (04/19 0600) SpO2:  [98 %] 98 % (04/19 0600)  General: WDWN patient in NAD. Psych:  Appropriate mood and affect. Neuro:  A&O x 3, Moving all extremities, sensation intact to light touch HEENT:  EOMs intact Chest:  Even non-labored respirations Skin:  Dressing/wound vac C/D/I, no rashes or lesions.  Scant serosanguinous fluid in vac. Extremities: warm/dry, no visible edema, erythema, or echymosis.  No lymphadenopathy. Pulses: Femoral 2+ MSK:  ROM: TKE, MMT: patient is able to perform quad set    LABS  Recent Labs  04/26/16 1009 04/27/16 0916  HGB 11.3* 10.7*  WBC 8.1 6.5  PLT 292 256    Recent Labs  04/26/16 1009 04/27/16 0916  NA 133* 136  K 4.2 4.3  CL 99* 102  CO2 25 25  BUN 13 9  CREATININE 1.04 0.89  GLUCOSE 250* 118*   No results for input(s): LABPT, INR in the last 72 hours.   Assessment/Plan: 2 Days Post-Op Procedure(s) (LRB): IRRIGATION AND DEBRIDEMENT EXTREMITY/Left Leg/Possible Wound Vac (Left)  NWB LLE Few staph present in culture PICC line order placed per ID recommendation. Plan for wound vac change in AM Plan for 2 week outpatient post-op visit with Dr. Victorino Dike. Plan for potential D/C tomorrow.  Alfredo Martinez, PA-C [facility] Orthopaedics Office:  [phone]

## 2016-04-28 NOTE — Progress Notes (Addendum)
Pt refused blood draws this morning. Pt stated that he is due to have PICC line inserted today and would like to get blood draw when its inserted. Will endorse appropriately

## 2016-04-29 ENCOUNTER — Encounter: Payer: BLUE CROSS/BLUE SHIELD | Admitting: Rehabilitation

## 2016-04-29 DIAGNOSIS — M14671 Charcot's joint, right ankle and foot: Secondary | ICD-10-CM | POA: Diagnosis not present

## 2016-04-29 DIAGNOSIS — T8744 Infection of amputation stump, left lower extremity: Secondary | ICD-10-CM | POA: Diagnosis not present

## 2016-04-29 DIAGNOSIS — M86172 Other acute osteomyelitis, left ankle and foot: Secondary | ICD-10-CM | POA: Diagnosis not present

## 2016-04-29 DIAGNOSIS — L97424 Non-pressure chronic ulcer of left heel and midfoot with necrosis of bone: Secondary | ICD-10-CM | POA: Diagnosis not present

## 2016-04-29 DIAGNOSIS — Z5181 Encounter for therapeutic drug level monitoring: Secondary | ICD-10-CM | POA: Diagnosis not present

## 2016-04-29 DIAGNOSIS — Z792 Long term (current) use of antibiotics: Secondary | ICD-10-CM | POA: Diagnosis not present

## 2016-04-29 DIAGNOSIS — M1460 Charcot's joint, unspecified site: Secondary | ICD-10-CM | POA: Diagnosis not present

## 2016-04-29 LAB — BASIC METABOLIC PANEL
ANION GAP: 12 (ref 5–15)
BUN: 6 mg/dL (ref 6–20)
CALCIUM: 9.2 mg/dL (ref 8.9–10.3)
CO2: 27 mmol/L (ref 22–32)
CREATININE: 0.91 mg/dL (ref 0.61–1.24)
Chloride: 100 mmol/L — ABNORMAL LOW (ref 101–111)
GFR calc Af Amer: >60 mL/min (ref >60)
GFR calc non Af Amer: >60 mL/min (ref >60)
GLUCOSE: 143 mg/dL — AB (ref 65–99)
Potassium: 3.7 mmol/L (ref 3.5–5.1)
Sodium: 139 mmol/L (ref 135–145)

## 2016-04-29 LAB — GLUCOSE, CAPILLARY: GLUCOSE-CAPILLARY: 159 mg/dL — AB (ref 65–99)

## 2016-04-29 LAB — CBC WITH DIFFERENTIAL/PLATELET
BASOS PCT: 0 %
Basophils Absolute: 0 10*3/uL (ref 0.0–0.1)
Eosinophils Absolute: 0.5 10*3/uL (ref 0.0–0.7)
Eosinophils Relative: 10 %
HEMATOCRIT: 30.5 % — AB (ref 39.0–52.0)
Hemoglobin: 10 g/dL — ABNORMAL LOW (ref 13.0–17.0)
LYMPHS ABS: 1.2 10*3/uL (ref 0.7–4.0)
Lymphocytes Relative: 24 %
MCH: 28.5 pg (ref 26.0–34.0)
MCHC: 32.8 g/dL (ref 30.0–36.0)
MCV: 86.9 fL (ref 78.0–100.0)
MONO ABS: 0.6 10*3/uL (ref 0.1–1.0)
MONOS PCT: 12 %
NEUTROS ABS: 2.6 10*3/uL (ref 1.7–7.7)
Neutrophils Relative %: 54 %
Platelets: 264 10*3/uL (ref 150–400)
RBC: 3.51 MIL/uL — ABNORMAL LOW (ref 4.22–5.81)
RDW: 12.9 % (ref 11.5–15.5)
WBC: 4.8 10*3/uL (ref 4.0–10.5)

## 2016-04-29 MED ORDER — HEPARIN SOD (PORK) LOCK FLUSH 100 UNIT/ML IV SOLN
250.0000 [IU] | INTRAVENOUS | Status: AC | PRN
  Administered 2016-04-29: 250 [IU]

## 2016-04-29 MED ORDER — TRAMADOL HCL 50 MG PO TABS: 50.0000 mg | ORAL_TABLET | Freq: Four times a day (QID) | ORAL | 0 refills | Status: DC | PRN

## 2016-04-29 MED ORDER — VANCOMYCIN IV (FOR PTA / DISCHARGE USE ONLY): 1250.0000 mg | Freq: Two times a day (BID) | INTRAVENOUS | 0 refills | Status: DC

## 2016-04-29 NOTE — Consult Note (Signed)
WOC Nurse wound consult note Reason for Consult: first post op NPWT dressing change with ortho PA. Wound type: surgical  Measurement: 2cm x 4cm x 3cm  Wound JXB:JYNWG, moist Drainage (amount, consistency, odor) minimal serousanginous   Periwound: intact  Dressing procedure/placement/frequency: Protected periwound skin with VAC drape.  1pc of black foam used to fill the wound bed. Patient tolerated well. Connected to home unit for DC to home.  Notified bedside nurse that dressing was applied and patient was hooked to home unit.  Discussed POC with patient and bedside nurse.  Re consult if needed, will not follow at this time. Thanks  Alieu Finnigan [employer], RN,CWOCN, CNS [phone])

## 2016-04-29 NOTE — Progress Notes (Signed)
Discharge paperwork given to patient. Patient is ready to discharge. No questions verbalized.

## 2016-04-29 NOTE — Progress Notes (Signed)
Subjective: 3 Days Post-Op Procedure(s) (LRB): IRRIGATION AND DEBRIDEMENT EXTREMITY/Left Leg/Possible Wound Vac (Left)  Patient reports pain as mild.  Tolerating POs well.  Admits to flatulence.  Denies fever, chills, N/V, SOB, CP.  Reports that he is ready to go home.  Objective:   VITALS:  Temp:  [98.6 F (37 C)-99 F (37.2 C)] 98.8 F (37.1 C) (04/20 0456) Pulse Rate:  [99-106] 99 (04/20 0456) Resp:  [17-19] 19 (04/20 0456) BP: (127-141)/(76-87) 130/85 (04/20 0456) SpO2:  [97 %-99 %] 99 % (04/20 0456)  General: WDWN patient in NAD. Psych:  Appropriate mood and affect. Neuro:  A&O x 3, Moving all extremities, sensation intact to light touch HEENT:  EOMs intact Chest:  Even non-labored respirations Skin:  Wound vac changed.  Wound is 3cm x 2cm x 2cm, clean with no purulence or signs of obvious infection   Extremities: warm/dry, mild edema, no erythema or echymosis.  No lymphadenopathy. Pulses: Femoral 2+ MSK:  ROM: TKE, MMT: patient is able to perform quad set    LABS  Recent Labs  04/26/16 1009 04/27/16 0916 04/29/16 0403  HGB 11.3* 10.7* 10.0*  WBC 8.1 6.5 4.8  PLT 292 256 264    Recent Labs  04/28/16 2227 04/29/16 0403  NA 135 139  K 4.0 3.7  CL 101 100*  CO2 28 27  BUN 7 6  CREATININE 0.90 0.91  GLUCOSE 187* 143*   No results for input(s): LABPT, INR in the last 72 hours.   Assessment/Plan: 3 Days Post-Op Procedure(s) (LRB): IRRIGATION AND DEBRIDEMENT EXTREMITY/Left Leg/Possible Wound Vac (Left)  NWB LLE Wound vac changed today by WOC nurse. D/C home today with [facility] for wound vac changes and IV vanc x 40 more days per ID recommendation. Scripts on chart Plan for 2 week outpatient post-op visit with Dr. Reed Breech, PA-C [facility] Office:  [phone]

## 2016-05-01 LAB — AEROBIC/ANAEROBIC CULTURE W GRAM STAIN (SURGICAL/DEEP WOUND)

## 2016-05-01 LAB — CULTURE, BLOOD (ROUTINE X 2)
CULTURE: NO GROWTH
Culture: NO GROWTH
SPECIAL REQUESTS: ADEQUATE
Special Requests: ADEQUATE

## 2016-05-01 LAB — AEROBIC/ANAEROBIC CULTURE (SURGICAL/DEEP WOUND)

## 2016-05-02 ENCOUNTER — Encounter: Payer: Self-pay | Admitting: Infectious Diseases

## 2016-05-02 DIAGNOSIS — L02415 Cutaneous abscess of right lower limb: Secondary | ICD-10-CM | POA: Diagnosis not present

## 2016-05-02 DIAGNOSIS — Z792 Long term (current) use of antibiotics: Secondary | ICD-10-CM | POA: Diagnosis not present

## 2016-05-02 DIAGNOSIS — Z89512 Acquired absence of left leg below knee: Secondary | ICD-10-CM | POA: Diagnosis not present

## 2016-05-02 DIAGNOSIS — T8744 Infection of amputation stump, left lower extremity: Secondary | ICD-10-CM | POA: Diagnosis not present

## 2016-05-02 DIAGNOSIS — E10610 Type 1 diabetes mellitus with diabetic neuropathic arthropathy: Secondary | ICD-10-CM | POA: Diagnosis not present

## 2016-05-02 DIAGNOSIS — E785 Hyperlipidemia, unspecified: Secondary | ICD-10-CM | POA: Diagnosis not present

## 2016-05-02 DIAGNOSIS — I10 Essential (primary) hypertension: Secondary | ICD-10-CM | POA: Diagnosis not present

## 2016-05-02 DIAGNOSIS — Z5181 Encounter for therapeutic drug level monitoring: Secondary | ICD-10-CM | POA: Diagnosis not present

## 2016-05-02 DIAGNOSIS — R7982 Elevated C-reactive protein (CRP): Secondary | ICD-10-CM | POA: Diagnosis not present

## 2016-05-02 DIAGNOSIS — R700 Elevated erythrocyte sedimentation rate: Secondary | ICD-10-CM | POA: Diagnosis not present

## 2016-05-02 DIAGNOSIS — D649 Anemia, unspecified: Secondary | ICD-10-CM | POA: Diagnosis not present

## 2016-05-02 DIAGNOSIS — Z9119 Patient's noncompliance with other medical treatment and regimen: Secondary | ICD-10-CM | POA: Diagnosis not present

## 2016-05-02 DIAGNOSIS — E559 Vitamin D deficiency, unspecified: Secondary | ICD-10-CM | POA: Diagnosis not present

## 2016-05-02 DIAGNOSIS — K219 Gastro-esophageal reflux disease without esophagitis: Secondary | ICD-10-CM | POA: Diagnosis not present

## 2016-05-02 DIAGNOSIS — Z452 Encounter for adjustment and management of vascular access device: Secondary | ICD-10-CM | POA: Diagnosis not present

## 2016-05-03 ENCOUNTER — Ambulatory Visit: Payer: BLUE CROSS/BLUE SHIELD | Admitting: Physical Therapy

## 2016-05-04 ENCOUNTER — Encounter: Payer: BLUE CROSS/BLUE SHIELD | Admitting: Physical Therapy

## 2016-05-04 ENCOUNTER — Encounter: Payer: Self-pay | Admitting: Infectious Diseases

## 2016-05-04 DIAGNOSIS — Z89512 Acquired absence of left leg below knee: Secondary | ICD-10-CM | POA: Diagnosis not present

## 2016-05-04 DIAGNOSIS — M86172 Other acute osteomyelitis, left ankle and foot: Secondary | ICD-10-CM | POA: Diagnosis not present

## 2016-05-04 DIAGNOSIS — Z792 Long term (current) use of antibiotics: Secondary | ICD-10-CM | POA: Diagnosis not present

## 2016-05-04 DIAGNOSIS — I10 Essential (primary) hypertension: Secondary | ICD-10-CM | POA: Diagnosis not present

## 2016-05-04 DIAGNOSIS — Z5181 Encounter for therapeutic drug level monitoring: Secondary | ICD-10-CM | POA: Diagnosis not present

## 2016-05-04 DIAGNOSIS — E10610 Type 1 diabetes mellitus with diabetic neuropathic arthropathy: Secondary | ICD-10-CM | POA: Diagnosis not present

## 2016-05-04 DIAGNOSIS — T8744 Infection of amputation stump, left lower extremity: Secondary | ICD-10-CM | POA: Diagnosis not present

## 2016-05-04 DIAGNOSIS — E559 Vitamin D deficiency, unspecified: Secondary | ICD-10-CM | POA: Diagnosis not present

## 2016-05-04 DIAGNOSIS — K219 Gastro-esophageal reflux disease without esophagitis: Secondary | ICD-10-CM | POA: Diagnosis not present

## 2016-05-04 DIAGNOSIS — E785 Hyperlipidemia, unspecified: Secondary | ICD-10-CM | POA: Diagnosis not present

## 2016-05-04 DIAGNOSIS — Z452 Encounter for adjustment and management of vascular access device: Secondary | ICD-10-CM | POA: Diagnosis not present

## 2016-05-04 DIAGNOSIS — Z9119 Patient's noncompliance with other medical treatment and regimen: Secondary | ICD-10-CM | POA: Diagnosis not present

## 2016-05-04 DIAGNOSIS — L02415 Cutaneous abscess of right lower limb: Secondary | ICD-10-CM | POA: Diagnosis not present

## 2016-05-06 ENCOUNTER — Other Ambulatory Visit: Payer: Self-pay | Admitting: Family Medicine

## 2016-05-06 ENCOUNTER — Telehealth: Payer: Self-pay | Admitting: Internal Medicine

## 2016-05-06 ENCOUNTER — Telehealth: Payer: Self-pay | Admitting: Family Medicine

## 2016-05-06 DIAGNOSIS — Z89512 Acquired absence of left leg below knee: Secondary | ICD-10-CM | POA: Diagnosis not present

## 2016-05-06 DIAGNOSIS — L02415 Cutaneous abscess of right lower limb: Secondary | ICD-10-CM | POA: Diagnosis not present

## 2016-05-06 DIAGNOSIS — E10610 Type 1 diabetes mellitus with diabetic neuropathic arthropathy: Secondary | ICD-10-CM | POA: Diagnosis not present

## 2016-05-06 DIAGNOSIS — Z9119 Patient's noncompliance with other medical treatment and regimen: Secondary | ICD-10-CM | POA: Diagnosis not present

## 2016-05-06 DIAGNOSIS — Z792 Long term (current) use of antibiotics: Secondary | ICD-10-CM | POA: Diagnosis not present

## 2016-05-06 DIAGNOSIS — I10 Essential (primary) hypertension: Secondary | ICD-10-CM | POA: Diagnosis not present

## 2016-05-06 DIAGNOSIS — Z452 Encounter for adjustment and management of vascular access device: Secondary | ICD-10-CM | POA: Diagnosis not present

## 2016-05-06 DIAGNOSIS — K219 Gastro-esophageal reflux disease without esophagitis: Secondary | ICD-10-CM | POA: Diagnosis not present

## 2016-05-06 DIAGNOSIS — Z5181 Encounter for therapeutic drug level monitoring: Secondary | ICD-10-CM | POA: Diagnosis not present

## 2016-05-06 DIAGNOSIS — T8744 Infection of amputation stump, left lower extremity: Secondary | ICD-10-CM | POA: Diagnosis not present

## 2016-05-06 DIAGNOSIS — E785 Hyperlipidemia, unspecified: Secondary | ICD-10-CM | POA: Diagnosis not present

## 2016-05-06 DIAGNOSIS — E559 Vitamin D deficiency, unspecified: Secondary | ICD-10-CM | POA: Diagnosis not present

## 2016-05-06 MED ORDER — INSULIN LISPRO 100 UNIT/ML ~~LOC~~ SOLN: 3.0000 [IU] | Freq: Three times a day (TID) | SUBCUTANEOUS | 2 refills | Status: DC

## 2016-05-06 NOTE — Telephone Encounter (Signed)
Refill submitted. 

## 2016-05-06 NOTE — Telephone Encounter (Signed)
insulin lispro (HUMALOG) 100 UNIT/ML injection  [facility] - [place], [place] - [address]

## 2016-05-07 DIAGNOSIS — M14671 Charcot's joint, right ankle and foot: Secondary | ICD-10-CM | POA: Diagnosis not present

## 2016-05-07 DIAGNOSIS — M1460 Charcot's joint, unspecified site: Secondary | ICD-10-CM | POA: Diagnosis not present

## 2016-05-07 DIAGNOSIS — M86172 Other acute osteomyelitis, left ankle and foot: Secondary | ICD-10-CM | POA: Diagnosis not present

## 2016-05-07 DIAGNOSIS — Z5181 Encounter for therapeutic drug level monitoring: Secondary | ICD-10-CM | POA: Diagnosis not present

## 2016-05-07 DIAGNOSIS — T8744 Infection of amputation stump, left lower extremity: Secondary | ICD-10-CM | POA: Diagnosis not present

## 2016-05-07 DIAGNOSIS — L97424 Non-pressure chronic ulcer of left heel and midfoot with necrosis of bone: Secondary | ICD-10-CM | POA: Diagnosis not present

## 2016-05-07 DIAGNOSIS — Z792 Long term (current) use of antibiotics: Secondary | ICD-10-CM | POA: Diagnosis not present

## 2016-05-09 ENCOUNTER — Encounter: Payer: Self-pay | Admitting: Infectious Diseases

## 2016-05-09 DIAGNOSIS — E10610 Type 1 diabetes mellitus with diabetic neuropathic arthropathy: Secondary | ICD-10-CM | POA: Diagnosis not present

## 2016-05-09 DIAGNOSIS — Z452 Encounter for adjustment and management of vascular access device: Secondary | ICD-10-CM | POA: Diagnosis not present

## 2016-05-09 DIAGNOSIS — T8744 Infection of amputation stump, left lower extremity: Secondary | ICD-10-CM | POA: Diagnosis not present

## 2016-05-09 DIAGNOSIS — M86172 Other acute osteomyelitis, left ankle and foot: Secondary | ICD-10-CM | POA: Diagnosis not present

## 2016-05-09 DIAGNOSIS — K219 Gastro-esophageal reflux disease without esophagitis: Secondary | ICD-10-CM | POA: Diagnosis not present

## 2016-05-09 DIAGNOSIS — E785 Hyperlipidemia, unspecified: Secondary | ICD-10-CM | POA: Diagnosis not present

## 2016-05-09 DIAGNOSIS — L02415 Cutaneous abscess of right lower limb: Secondary | ICD-10-CM | POA: Diagnosis not present

## 2016-05-09 DIAGNOSIS — E559 Vitamin D deficiency, unspecified: Secondary | ICD-10-CM | POA: Diagnosis not present

## 2016-05-09 DIAGNOSIS — Z89512 Acquired absence of left leg below knee: Secondary | ICD-10-CM | POA: Diagnosis not present

## 2016-05-09 DIAGNOSIS — Z9119 Patient's noncompliance with other medical treatment and regimen: Secondary | ICD-10-CM | POA: Diagnosis not present

## 2016-05-09 DIAGNOSIS — Z792 Long term (current) use of antibiotics: Secondary | ICD-10-CM | POA: Diagnosis not present

## 2016-05-09 DIAGNOSIS — Z5181 Encounter for therapeutic drug level monitoring: Secondary | ICD-10-CM | POA: Diagnosis not present

## 2016-05-09 DIAGNOSIS — I10 Essential (primary) hypertension: Secondary | ICD-10-CM | POA: Diagnosis not present

## 2016-05-10 ENCOUNTER — Encounter: Payer: BLUE CROSS/BLUE SHIELD | Admitting: Physical Therapy

## 2016-05-10 DIAGNOSIS — T8744 Infection of amputation stump, left lower extremity: Secondary | ICD-10-CM | POA: Diagnosis not present

## 2016-05-11 ENCOUNTER — Encounter: Payer: Self-pay | Admitting: Infectious Diseases

## 2016-05-11 ENCOUNTER — Encounter: Payer: BLUE CROSS/BLUE SHIELD | Admitting: Physical Therapy

## 2016-05-11 DIAGNOSIS — Z89512 Acquired absence of left leg below knee: Secondary | ICD-10-CM | POA: Diagnosis not present

## 2016-05-11 DIAGNOSIS — Z5181 Encounter for therapeutic drug level monitoring: Secondary | ICD-10-CM | POA: Diagnosis not present

## 2016-05-11 DIAGNOSIS — M86172 Other acute osteomyelitis, left ankle and foot: Secondary | ICD-10-CM | POA: Diagnosis not present

## 2016-05-11 DIAGNOSIS — E785 Hyperlipidemia, unspecified: Secondary | ICD-10-CM | POA: Diagnosis not present

## 2016-05-11 DIAGNOSIS — T8744 Infection of amputation stump, left lower extremity: Secondary | ICD-10-CM | POA: Diagnosis not present

## 2016-05-11 DIAGNOSIS — E10610 Type 1 diabetes mellitus with diabetic neuropathic arthropathy: Secondary | ICD-10-CM | POA: Diagnosis not present

## 2016-05-11 DIAGNOSIS — Z792 Long term (current) use of antibiotics: Secondary | ICD-10-CM | POA: Diagnosis not present

## 2016-05-11 DIAGNOSIS — Z9119 Patient's noncompliance with other medical treatment and regimen: Secondary | ICD-10-CM | POA: Diagnosis not present

## 2016-05-11 DIAGNOSIS — L02415 Cutaneous abscess of right lower limb: Secondary | ICD-10-CM | POA: Diagnosis not present

## 2016-05-11 DIAGNOSIS — K219 Gastro-esophageal reflux disease without esophagitis: Secondary | ICD-10-CM | POA: Diagnosis not present

## 2016-05-11 DIAGNOSIS — E559 Vitamin D deficiency, unspecified: Secondary | ICD-10-CM | POA: Diagnosis not present

## 2016-05-11 DIAGNOSIS — I10 Essential (primary) hypertension: Secondary | ICD-10-CM | POA: Diagnosis not present

## 2016-05-11 DIAGNOSIS — Z452 Encounter for adjustment and management of vascular access device: Secondary | ICD-10-CM | POA: Diagnosis not present

## 2016-05-13 DIAGNOSIS — L02415 Cutaneous abscess of right lower limb: Secondary | ICD-10-CM | POA: Diagnosis not present

## 2016-05-13 DIAGNOSIS — E1169 Type 2 diabetes mellitus with other specified complication: Secondary | ICD-10-CM | POA: Diagnosis not present

## 2016-05-13 DIAGNOSIS — Z792 Long term (current) use of antibiotics: Secondary | ICD-10-CM | POA: Diagnosis not present

## 2016-05-13 DIAGNOSIS — E559 Vitamin D deficiency, unspecified: Secondary | ICD-10-CM | POA: Diagnosis not present

## 2016-05-13 DIAGNOSIS — K219 Gastro-esophageal reflux disease without esophagitis: Secondary | ICD-10-CM | POA: Diagnosis not present

## 2016-05-13 DIAGNOSIS — E785 Hyperlipidemia, unspecified: Secondary | ICD-10-CM | POA: Diagnosis not present

## 2016-05-13 DIAGNOSIS — Z452 Encounter for adjustment and management of vascular access device: Secondary | ICD-10-CM | POA: Diagnosis not present

## 2016-05-13 DIAGNOSIS — Z5181 Encounter for therapeutic drug level monitoring: Secondary | ICD-10-CM | POA: Diagnosis not present

## 2016-05-13 DIAGNOSIS — I10 Essential (primary) hypertension: Secondary | ICD-10-CM | POA: Diagnosis not present

## 2016-05-13 DIAGNOSIS — Z9119 Patient's noncompliance with other medical treatment and regimen: Secondary | ICD-10-CM | POA: Diagnosis not present

## 2016-05-13 DIAGNOSIS — E10610 Type 1 diabetes mellitus with diabetic neuropathic arthropathy: Secondary | ICD-10-CM | POA: Diagnosis not present

## 2016-05-13 DIAGNOSIS — T8189XA Other complications of procedures, not elsewhere classified, initial encounter: Secondary | ICD-10-CM | POA: Diagnosis not present

## 2016-05-13 DIAGNOSIS — T8744 Infection of amputation stump, left lower extremity: Secondary | ICD-10-CM | POA: Diagnosis not present

## 2016-05-13 DIAGNOSIS — Z89512 Acquired absence of left leg below knee: Secondary | ICD-10-CM | POA: Diagnosis not present

## 2016-05-14 DIAGNOSIS — T8189XA Other complications of procedures, not elsewhere classified, initial encounter: Secondary | ICD-10-CM | POA: Diagnosis not present

## 2016-05-14 DIAGNOSIS — T8744 Infection of amputation stump, left lower extremity: Secondary | ICD-10-CM | POA: Diagnosis not present

## 2016-05-14 DIAGNOSIS — E1169 Type 2 diabetes mellitus with other specified complication: Secondary | ICD-10-CM | POA: Diagnosis not present

## 2016-05-14 DIAGNOSIS — Z5181 Encounter for therapeutic drug level monitoring: Secondary | ICD-10-CM | POA: Diagnosis not present

## 2016-05-14 DIAGNOSIS — Z792 Long term (current) use of antibiotics: Secondary | ICD-10-CM | POA: Diagnosis not present

## 2016-05-15 DIAGNOSIS — T8189XA Other complications of procedures, not elsewhere classified, initial encounter: Secondary | ICD-10-CM | POA: Diagnosis not present

## 2016-05-15 DIAGNOSIS — M86172 Other acute osteomyelitis, left ankle and foot: Secondary | ICD-10-CM | POA: Diagnosis not present

## 2016-05-15 DIAGNOSIS — L97424 Non-pressure chronic ulcer of left heel and midfoot with necrosis of bone: Secondary | ICD-10-CM | POA: Diagnosis not present

## 2016-05-15 DIAGNOSIS — M1460 Charcot's joint, unspecified site: Secondary | ICD-10-CM | POA: Diagnosis not present

## 2016-05-15 DIAGNOSIS — T8744 Infection of amputation stump, left lower extremity: Secondary | ICD-10-CM | POA: Diagnosis not present

## 2016-05-15 DIAGNOSIS — E1169 Type 2 diabetes mellitus with other specified complication: Secondary | ICD-10-CM | POA: Diagnosis not present

## 2016-05-15 DIAGNOSIS — M14671 Charcot's joint, right ankle and foot: Secondary | ICD-10-CM | POA: Diagnosis not present

## 2016-05-16 ENCOUNTER — Encounter: Payer: BLUE CROSS/BLUE SHIELD | Admitting: Physical Therapy

## 2016-05-16 DIAGNOSIS — E10610 Type 1 diabetes mellitus with diabetic neuropathic arthropathy: Secondary | ICD-10-CM | POA: Diagnosis not present

## 2016-05-16 DIAGNOSIS — T8744 Infection of amputation stump, left lower extremity: Secondary | ICD-10-CM | POA: Diagnosis not present

## 2016-05-16 DIAGNOSIS — M86172 Other acute osteomyelitis, left ankle and foot: Secondary | ICD-10-CM | POA: Diagnosis not present

## 2016-05-16 DIAGNOSIS — I10 Essential (primary) hypertension: Secondary | ICD-10-CM | POA: Diagnosis not present

## 2016-05-16 DIAGNOSIS — Z89512 Acquired absence of left leg below knee: Secondary | ICD-10-CM | POA: Diagnosis not present

## 2016-05-16 DIAGNOSIS — Z792 Long term (current) use of antibiotics: Secondary | ICD-10-CM | POA: Diagnosis not present

## 2016-05-16 DIAGNOSIS — Z5181 Encounter for therapeutic drug level monitoring: Secondary | ICD-10-CM | POA: Diagnosis not present

## 2016-05-16 DIAGNOSIS — T8189XA Other complications of procedures, not elsewhere classified, initial encounter: Secondary | ICD-10-CM | POA: Diagnosis not present

## 2016-05-16 DIAGNOSIS — K219 Gastro-esophageal reflux disease without esophagitis: Secondary | ICD-10-CM | POA: Diagnosis not present

## 2016-05-16 DIAGNOSIS — E559 Vitamin D deficiency, unspecified: Secondary | ICD-10-CM | POA: Diagnosis not present

## 2016-05-16 DIAGNOSIS — E785 Hyperlipidemia, unspecified: Secondary | ICD-10-CM | POA: Diagnosis not present

## 2016-05-16 DIAGNOSIS — L02415 Cutaneous abscess of right lower limb: Secondary | ICD-10-CM | POA: Diagnosis not present

## 2016-05-16 DIAGNOSIS — Z452 Encounter for adjustment and management of vascular access device: Secondary | ICD-10-CM | POA: Diagnosis not present

## 2016-05-16 DIAGNOSIS — Z9119 Patient's noncompliance with other medical treatment and regimen: Secondary | ICD-10-CM | POA: Diagnosis not present

## 2016-05-16 DIAGNOSIS — E1169 Type 2 diabetes mellitus with other specified complication: Secondary | ICD-10-CM | POA: Diagnosis not present

## 2016-05-17 ENCOUNTER — Encounter: Payer: BLUE CROSS/BLUE SHIELD | Admitting: Physical Therapy

## 2016-05-17 DIAGNOSIS — T8189XA Other complications of procedures, not elsewhere classified, initial encounter: Secondary | ICD-10-CM | POA: Diagnosis not present

## 2016-05-17 DIAGNOSIS — E1169 Type 2 diabetes mellitus with other specified complication: Secondary | ICD-10-CM | POA: Diagnosis not present

## 2016-05-18 ENCOUNTER — Encounter: Payer: Self-pay | Admitting: Infectious Diseases

## 2016-05-18 DIAGNOSIS — E1169 Type 2 diabetes mellitus with other specified complication: Secondary | ICD-10-CM | POA: Diagnosis not present

## 2016-05-18 DIAGNOSIS — M86172 Other acute osteomyelitis, left ankle and foot: Secondary | ICD-10-CM | POA: Diagnosis not present

## 2016-05-18 DIAGNOSIS — T8744 Infection of amputation stump, left lower extremity: Secondary | ICD-10-CM | POA: Diagnosis not present

## 2016-05-18 DIAGNOSIS — L02415 Cutaneous abscess of right lower limb: Secondary | ICD-10-CM | POA: Diagnosis not present

## 2016-05-18 DIAGNOSIS — Z792 Long term (current) use of antibiotics: Secondary | ICD-10-CM | POA: Diagnosis not present

## 2016-05-18 DIAGNOSIS — I10 Essential (primary) hypertension: Secondary | ICD-10-CM | POA: Diagnosis not present

## 2016-05-18 DIAGNOSIS — T8189XA Other complications of procedures, not elsewhere classified, initial encounter: Secondary | ICD-10-CM | POA: Diagnosis not present

## 2016-05-18 DIAGNOSIS — E785 Hyperlipidemia, unspecified: Secondary | ICD-10-CM | POA: Diagnosis not present

## 2016-05-18 DIAGNOSIS — Z89512 Acquired absence of left leg below knee: Secondary | ICD-10-CM | POA: Diagnosis not present

## 2016-05-18 DIAGNOSIS — E10610 Type 1 diabetes mellitus with diabetic neuropathic arthropathy: Secondary | ICD-10-CM | POA: Diagnosis not present

## 2016-05-18 DIAGNOSIS — Z9119 Patient's noncompliance with other medical treatment and regimen: Secondary | ICD-10-CM | POA: Diagnosis not present

## 2016-05-18 DIAGNOSIS — Z5181 Encounter for therapeutic drug level monitoring: Secondary | ICD-10-CM | POA: Diagnosis not present

## 2016-05-18 DIAGNOSIS — Z452 Encounter for adjustment and management of vascular access device: Secondary | ICD-10-CM | POA: Diagnosis not present

## 2016-05-18 DIAGNOSIS — K219 Gastro-esophageal reflux disease without esophagitis: Secondary | ICD-10-CM | POA: Diagnosis not present

## 2016-05-18 DIAGNOSIS — E559 Vitamin D deficiency, unspecified: Secondary | ICD-10-CM | POA: Diagnosis not present

## 2016-05-19 DIAGNOSIS — T8189XA Other complications of procedures, not elsewhere classified, initial encounter: Secondary | ICD-10-CM | POA: Diagnosis not present

## 2016-05-19 DIAGNOSIS — E1169 Type 2 diabetes mellitus with other specified complication: Secondary | ICD-10-CM | POA: Diagnosis not present

## 2016-05-19 DIAGNOSIS — T8744 Infection of amputation stump, left lower extremity: Secondary | ICD-10-CM | POA: Diagnosis not present

## 2016-05-20 DIAGNOSIS — T8744 Infection of amputation stump, left lower extremity: Secondary | ICD-10-CM | POA: Diagnosis not present

## 2016-05-20 DIAGNOSIS — Z792 Long term (current) use of antibiotics: Secondary | ICD-10-CM | POA: Diagnosis not present

## 2016-05-20 DIAGNOSIS — E1169 Type 2 diabetes mellitus with other specified complication: Secondary | ICD-10-CM | POA: Diagnosis not present

## 2016-05-20 DIAGNOSIS — T8189XA Other complications of procedures, not elsewhere classified, initial encounter: Secondary | ICD-10-CM | POA: Diagnosis not present

## 2016-05-20 DIAGNOSIS — Z5181 Encounter for therapeutic drug level monitoring: Secondary | ICD-10-CM | POA: Diagnosis not present

## 2016-05-21 DIAGNOSIS — E1169 Type 2 diabetes mellitus with other specified complication: Secondary | ICD-10-CM | POA: Diagnosis not present

## 2016-05-21 DIAGNOSIS — T8189XA Other complications of procedures, not elsewhere classified, initial encounter: Secondary | ICD-10-CM | POA: Diagnosis not present

## 2016-05-22 DIAGNOSIS — T8189XA Other complications of procedures, not elsewhere classified, initial encounter: Secondary | ICD-10-CM | POA: Diagnosis not present

## 2016-05-22 DIAGNOSIS — E1169 Type 2 diabetes mellitus with other specified complication: Secondary | ICD-10-CM | POA: Diagnosis not present

## 2016-05-23 ENCOUNTER — Encounter: Payer: Self-pay | Admitting: Infectious Diseases

## 2016-05-23 ENCOUNTER — Encounter: Payer: BLUE CROSS/BLUE SHIELD | Admitting: Physical Therapy

## 2016-05-23 DIAGNOSIS — E1169 Type 2 diabetes mellitus with other specified complication: Secondary | ICD-10-CM | POA: Diagnosis not present

## 2016-05-23 DIAGNOSIS — E10610 Type 1 diabetes mellitus with diabetic neuropathic arthropathy: Secondary | ICD-10-CM | POA: Diagnosis not present

## 2016-05-23 DIAGNOSIS — T8744 Infection of amputation stump, left lower extremity: Secondary | ICD-10-CM | POA: Diagnosis not present

## 2016-05-23 DIAGNOSIS — L02415 Cutaneous abscess of right lower limb: Secondary | ICD-10-CM | POA: Diagnosis not present

## 2016-05-23 DIAGNOSIS — Z9119 Patient's noncompliance with other medical treatment and regimen: Secondary | ICD-10-CM | POA: Diagnosis not present

## 2016-05-23 DIAGNOSIS — E785 Hyperlipidemia, unspecified: Secondary | ICD-10-CM | POA: Diagnosis not present

## 2016-05-23 DIAGNOSIS — Z5181 Encounter for therapeutic drug level monitoring: Secondary | ICD-10-CM | POA: Diagnosis not present

## 2016-05-23 DIAGNOSIS — Z89512 Acquired absence of left leg below knee: Secondary | ICD-10-CM | POA: Diagnosis not present

## 2016-05-23 DIAGNOSIS — I10 Essential (primary) hypertension: Secondary | ICD-10-CM | POA: Diagnosis not present

## 2016-05-23 DIAGNOSIS — M86172 Other acute osteomyelitis, left ankle and foot: Secondary | ICD-10-CM | POA: Diagnosis not present

## 2016-05-23 DIAGNOSIS — Z452 Encounter for adjustment and management of vascular access device: Secondary | ICD-10-CM | POA: Diagnosis not present

## 2016-05-23 DIAGNOSIS — E559 Vitamin D deficiency, unspecified: Secondary | ICD-10-CM | POA: Diagnosis not present

## 2016-05-23 DIAGNOSIS — K219 Gastro-esophageal reflux disease without esophagitis: Secondary | ICD-10-CM | POA: Diagnosis not present

## 2016-05-23 DIAGNOSIS — Z792 Long term (current) use of antibiotics: Secondary | ICD-10-CM | POA: Diagnosis not present

## 2016-05-23 DIAGNOSIS — T8189XA Other complications of procedures, not elsewhere classified, initial encounter: Secondary | ICD-10-CM | POA: Diagnosis not present

## 2016-05-25 ENCOUNTER — Encounter: Payer: Self-pay | Admitting: Infectious Diseases

## 2016-05-25 ENCOUNTER — Encounter: Payer: BLUE CROSS/BLUE SHIELD | Admitting: Physical Therapy

## 2016-05-25 DIAGNOSIS — Z5181 Encounter for therapeutic drug level monitoring: Secondary | ICD-10-CM | POA: Diagnosis not present

## 2016-05-25 DIAGNOSIS — M86172 Other acute osteomyelitis, left ankle and foot: Secondary | ICD-10-CM | POA: Diagnosis not present

## 2016-05-25 DIAGNOSIS — Z792 Long term (current) use of antibiotics: Secondary | ICD-10-CM | POA: Diagnosis not present

## 2016-05-25 DIAGNOSIS — T8744 Infection of amputation stump, left lower extremity: Secondary | ICD-10-CM | POA: Diagnosis not present

## 2016-05-27 DIAGNOSIS — Z452 Encounter for adjustment and management of vascular access device: Secondary | ICD-10-CM | POA: Diagnosis not present

## 2016-05-27 DIAGNOSIS — T8744 Infection of amputation stump, left lower extremity: Secondary | ICD-10-CM | POA: Diagnosis not present

## 2016-05-27 DIAGNOSIS — E10610 Type 1 diabetes mellitus with diabetic neuropathic arthropathy: Secondary | ICD-10-CM | POA: Diagnosis not present

## 2016-05-27 DIAGNOSIS — L02415 Cutaneous abscess of right lower limb: Secondary | ICD-10-CM | POA: Diagnosis not present

## 2016-05-27 DIAGNOSIS — Z792 Long term (current) use of antibiotics: Secondary | ICD-10-CM | POA: Diagnosis not present

## 2016-05-27 DIAGNOSIS — I10 Essential (primary) hypertension: Secondary | ICD-10-CM | POA: Diagnosis not present

## 2016-05-27 DIAGNOSIS — K219 Gastro-esophageal reflux disease without esophagitis: Secondary | ICD-10-CM | POA: Diagnosis not present

## 2016-05-27 DIAGNOSIS — Z5181 Encounter for therapeutic drug level monitoring: Secondary | ICD-10-CM | POA: Diagnosis not present

## 2016-05-27 DIAGNOSIS — E559 Vitamin D deficiency, unspecified: Secondary | ICD-10-CM | POA: Diagnosis not present

## 2016-05-27 DIAGNOSIS — Z9119 Patient's noncompliance with other medical treatment and regimen: Secondary | ICD-10-CM | POA: Diagnosis not present

## 2016-05-27 DIAGNOSIS — E785 Hyperlipidemia, unspecified: Secondary | ICD-10-CM | POA: Diagnosis not present

## 2016-05-27 DIAGNOSIS — Z89512 Acquired absence of left leg below knee: Secondary | ICD-10-CM | POA: Diagnosis not present

## 2016-05-30 ENCOUNTER — Encounter: Payer: BLUE CROSS/BLUE SHIELD | Admitting: Physical Therapy

## 2016-05-30 ENCOUNTER — Encounter: Payer: Self-pay | Admitting: Infectious Diseases

## 2016-05-30 DIAGNOSIS — M86129 Other acute osteomyelitis, unspecified humerus: Secondary | ICD-10-CM | POA: Diagnosis not present

## 2016-05-30 DIAGNOSIS — T8744 Infection of amputation stump, left lower extremity: Secondary | ICD-10-CM | POA: Diagnosis not present

## 2016-05-30 DIAGNOSIS — Z5181 Encounter for therapeutic drug level monitoring: Secondary | ICD-10-CM | POA: Diagnosis not present

## 2016-05-30 DIAGNOSIS — Z792 Long term (current) use of antibiotics: Secondary | ICD-10-CM | POA: Diagnosis not present

## 2016-05-31 ENCOUNTER — Telehealth: Payer: Self-pay | Admitting: Family Medicine

## 2016-05-31 NOTE — Telephone Encounter (Signed)
PT called while office at lunch states he wants to see provider (Dr. Val Eagle) he is having sharpe pain, does not speicify where on body pains are-- pls call him at [phone]. --glh

## 2016-05-31 NOTE — Telephone Encounter (Signed)
Patient having problem with ear and teeth pain.  Appointment made.

## 2016-06-01 ENCOUNTER — Ambulatory Visit (INDEPENDENT_AMBULATORY_CARE_PROVIDER_SITE_OTHER): Payer: BLUE CROSS/BLUE SHIELD | Admitting: Family Medicine

## 2016-06-01 ENCOUNTER — Encounter: Payer: Self-pay | Admitting: Family Medicine

## 2016-06-01 ENCOUNTER — Encounter: Payer: BLUE CROSS/BLUE SHIELD | Admitting: Physical Therapy

## 2016-06-01 ENCOUNTER — Telehealth: Payer: Self-pay | Admitting: Infectious Diseases

## 2016-06-01 VITALS — BP 143/88 | HR 101 | Temp 98.9°F

## 2016-06-01 DIAGNOSIS — L02415 Cutaneous abscess of right lower limb: Secondary | ICD-10-CM | POA: Diagnosis not present

## 2016-06-01 DIAGNOSIS — J310 Chronic rhinitis: Secondary | ICD-10-CM

## 2016-06-01 DIAGNOSIS — H6592 Unspecified nonsuppurative otitis media, left ear: Secondary | ICD-10-CM | POA: Diagnosis not present

## 2016-06-01 DIAGNOSIS — Z452 Encounter for adjustment and management of vascular access device: Secondary | ICD-10-CM | POA: Diagnosis not present

## 2016-06-01 DIAGNOSIS — Z792 Long term (current) use of antibiotics: Secondary | ICD-10-CM | POA: Diagnosis not present

## 2016-06-01 DIAGNOSIS — E785 Hyperlipidemia, unspecified: Secondary | ICD-10-CM | POA: Diagnosis not present

## 2016-06-01 DIAGNOSIS — E559 Vitamin D deficiency, unspecified: Secondary | ICD-10-CM | POA: Diagnosis not present

## 2016-06-01 DIAGNOSIS — Z5181 Encounter for therapeutic drug level monitoring: Secondary | ICD-10-CM | POA: Diagnosis not present

## 2016-06-01 DIAGNOSIS — Z89512 Acquired absence of left leg below knee: Secondary | ICD-10-CM | POA: Diagnosis not present

## 2016-06-01 DIAGNOSIS — E109 Type 1 diabetes mellitus without complications: Secondary | ICD-10-CM

## 2016-06-01 DIAGNOSIS — K219 Gastro-esophageal reflux disease without esophagitis: Secondary | ICD-10-CM | POA: Diagnosis not present

## 2016-06-01 DIAGNOSIS — A4902 Methicillin resistant Staphylococcus aureus infection, unspecified site: Secondary | ICD-10-CM

## 2016-06-01 DIAGNOSIS — T8744 Infection of amputation stump, left lower extremity: Secondary | ICD-10-CM | POA: Diagnosis not present

## 2016-06-01 DIAGNOSIS — E10610 Type 1 diabetes mellitus with diabetic neuropathic arthropathy: Secondary | ICD-10-CM | POA: Diagnosis not present

## 2016-06-01 DIAGNOSIS — J329 Chronic sinusitis, unspecified: Secondary | ICD-10-CM

## 2016-06-01 DIAGNOSIS — I10 Essential (primary) hypertension: Secondary | ICD-10-CM | POA: Diagnosis not present

## 2016-06-01 DIAGNOSIS — Z9119 Patient's noncompliance with other medical treatment and regimen: Secondary | ICD-10-CM | POA: Diagnosis not present

## 2016-06-01 MED ORDER — DOXYCYCLINE HYCLATE 100 MG PO TABS: 100.0000 mg | ORAL_TABLET | Freq: Two times a day (BID) | ORAL | 1 refills | Status: DC

## 2016-06-01 NOTE — Assessment & Plan Note (Signed)
Strongly encouraged patient to follow up with ID in any other specialists he has to see.  He understands he needs to get better control of his blood sugars and he'll discuss possibility of insulin pump with his endocrine doc

## 2016-06-01 NOTE — Patient Instructions (Signed)
Take doxycycline 100mg  twice daily given to you by ID Doc-  For your sinuses/ ear I recommend 10 days of treatment but obviously if they would like her on the medicine longer he will follow there regiment.  Take Flonase nasal sprays one spray each nostril twice daily which can be preceded by sinus rinses-Ayr or Lloyd Huger med sinus rinses.  Claritin daily.  Please continue this until the spring allergies are calmed down usually around early July.  Tylenol when necessary pain.   Sinusitis, Adult Sinusitis is soreness and inflammation of your sinuses. Sinuses are hollow spaces in the bones around your face. Your sinuses are located:  Around your eyes.  In the middle of your forehead.  Behind your nose.  In your cheekbones. Your sinuses and nasal passages are lined with a stringy fluid (mucus). Mucus normally drains out of your sinuses. When your nasal tissues become inflamed or swollen, the mucus can become trapped or blocked so air cannot flow through your sinuses. This allows bacteria, viruses, and funguses to grow, which leads to infection. Sinusitis can develop quickly and last for 7?10 days (acute) or for more than 12 weeks (chronic). Sinusitis often develops after a cold. What are the causes? This condition is caused by anything that creates swelling in the sinuses or stops mucus from draining, including:  Allergies.  Asthma.  Bacterial or viral infection.  Abnormally shaped bones between the nasal passages.  Nasal growths that contain mucus (nasal polyps).  Narrow sinus openings.  Pollutants, such as chemicals or irritants in the air.  A foreign object stuck in the nose.  A fungal infection. This is rare. What increases the risk? The following factors may make you more likely to develop this condition:  Having allergies or asthma.  Having had a recent cold or respiratory tract infection.  Having structural deformities or blockages in your nose or sinuses.  Having a  weak immune system.  Doing a lot of swimming or diving.  Overusing nasal sprays.  Smoking. What are the signs or symptoms? The main symptoms of this condition are pain and a feeling of pressure around the affected sinuses. Other symptoms include:  Upper toothache.  Earache.  Headache.  Bad breath.  Decreased sense of smell and taste.  A cough that may get worse at night.  Fatigue.  Fever.  Thick drainage from your nose. The drainage is often green and it may contain pus (purulent).  Stuffy nose or congestion.  Postnasal drip. This is when extra mucus collects in the throat or back of the nose.  Swelling and warmth over the affected sinuses.  Sore throat.  Sensitivity to light. How is this diagnosed? This condition is diagnosed based on symptoms, a medical history, and a physical exam. To find out if your condition is acute or chronic, your health care provider may:  Look in your nose for signs of nasal polyps.  Tap over the affected sinus to check for signs of infection.  View the inside of your sinuses using an imaging device that has a light attached (endoscope). If your health care provider suspects that you have chronic sinusitis, you may also:  Be tested for allergies.  Have a sample of mucus taken from your nose (nasal culture) and checked for bacteria.  Have a mucus sample examined to see if your sinusitis is related to an allergy. If your sinusitis does not respond to treatment and it lasts longer than 8 weeks, you may have an MRI or CT scan  to check your sinuses. These scans also help to determine how severe your infection is. In rare cases, a bone biopsy may be done to rule out more serious types of fungal sinus disease. How is this treated? Treatment for sinusitis depends on the cause and whether your condition is chronic or acute. If a virus is causing your sinusitis, your symptoms will go away on their own within 10 days. You may be given medicines  to relieve your symptoms, including:  Topical nasal decongestants. They shrink swollen nasal passages and let mucus drain from your sinuses.  Antihistamines. These drugs block inflammation that is triggered by allergies. This can help to ease swelling in your nose and sinuses.  Topical nasal corticosteroids. These are nasal sprays that ease inflammation and swelling in your nose and sinuses.  Nasal saline washes. These rinses can help to get rid of thick mucus in your nose. If your condition is caused by bacteria, you will be given an antibiotic medicine. If your condition is caused by a fungus, you will be given an antifungal medicine. Surgery may be needed to correct underlying conditions, such as narrow nasal passages. Surgery may also be needed to remove polyps. Follow these instructions at home: Medicines   Take, use, or apply over-the-counter and prescription medicines only as told by your health care provider. These may include nasal sprays.  If you were prescribed an antibiotic medicine, take it as told by your health care provider. Do not stop taking the antibiotic even if you start to feel better. Hydrate and Humidify   Drink enough water to keep your urine clear or pale yellow. Staying hydrated will help to thin your mucus.  Use a cool mist humidifier to keep the humidity level in your home above 50%.  Inhale steam for 10-15 minutes, 3-4 times a day or as told by your health care provider. You can do this in the bathroom while a hot shower is running.  Limit your exposure to cool or dry air. Rest   Rest as much as possible.  Sleep with your head raised (elevated).  Make sure to get enough sleep each night. General instructions   Apply a warm, moist washcloth to your face 3-4 times a day or as told by your health care provider. This will help with discomfort.  Wash your hands often with soap and water to reduce your exposure to viruses and other germs. If soap and water  are not available, use hand sanitizer.  Do not smoke. Avoid being around people who are smoking (secondhand smoke).  Keep all follow-up visits as told by your health care provider. This is important. Contact a health care provider if:  You have a fever.  Your symptoms get worse.  Your symptoms do not improve within 10 days. Get help right away if:  You have a severe headache.  You have persistent vomiting.  You have pain or swelling around your face or eyes.  You have vision problems.  You develop confusion.  Your neck is stiff.  You have trouble breathing. This information is not intended to replace advice given to you by your health care provider. Make sure you discuss any questions you have with your health care provider. Document Released: 12/27/2004 Document Revised: 08/23/2015 Document Reviewed: 10/22/2014 Elsevier Interactive Patient Education  2017 [employer].

## 2016-06-01 NOTE — Telephone Encounter (Signed)
Led by Ilda Basset- pt refusing further blood work.  His PIC is not drawing (had cath flow last week) CRP 10.8 last draw.  Will change him to doxy 100mg  bid He has f/u appt next week Will pull PIC, stop vanco

## 2016-06-01 NOTE — Progress Notes (Signed)
Acute Care Office visit  Assessment and plan:  1. Left otitis media with effusion   2. Rhinosinusitis   3. DM w/o complication type I (HCC)   4. Hx of BKA, left - with subsequent cellulitis and treatment for osteomylitis    Hx of BKA, left - with subsequent cellulitis and treatment for osteomylitis Strongly encouraged patient to follow up with ID in any other specialists he has to see.  He understands he needs to get better control of his blood sugars and he'll discuss possibility of insulin pump with his endocrine doc   Anticipatory guidance and routine counseling done re: condition, txmnt options and need for follow up. All questions of patient's were answered.  - Per Dr. Dario Ave note from [phone] at 9:35 AM:  Ginnie Smart, MD  06/01/16 9:35 AM  Note    Led by Ilda Basset- pt refusing further blood work.  His PIC is not drawing (had cath flow last week) CRP 10.8 last draw.  Will change him to doxy 100mg  bid He has f/u appt next week Will pull PIC, stop vanco     --> The since patient will be transitioned to Doxy 100 mg twice a day, he will take that out antibiotic which will help with the sinusitis and otitis.  He knows to take it for at least 10 days and likely we'll be treating him for a longer period of time.  Take doxycycline 100mg  twice daily given to you by ID Doc-  For your sinuses/ ear I recommend 10 days of treatment but obviously if they would like her on the medicine longer he will follow there regiment.  Take Flonase nasal sprays one spray each nostril twice daily which can be preceded by sinus rinses-Ayr or Lloyd Huger med sinus rinses.  Claritin daily.  Please continue this until the spring allergies are calmed down usually around early July.  Tylenol when necessary pain.  --> Over-the-counter supportive medicines discussed with patient and wife.  He will let us know if he is not continued to improve slowly after symptoms last 7-10 days.  -->  Discussed  the patient importance of prudent follow-up with his endocrinologist Dr. Sharl Ma today.  Patient states he does not care for this doctor and I recommended he call the office and follow up with a different provider there as he is now patient of their practice.    I did explain to him if he continues with this history of noncompliance, he may be asked to leave various practices.   Return if symptoms worsen or fail to improve, for Also make a follow-up near future to discuss chronic medical issues.  Please see AVS handed out to patient at the end of our visit for additional patient instructions/ counseling done pertaining to today's office visit.  Note: This document was prepared using Dragon voice recognition software and may include unintentional dictation errors.    Subjective:    Chief Complaint  Patient presents with  . Sinusitis    HPI:  Pt presents with URI sx for 1 days- L ear pain and teeth hurt on l side face.   - both upper and lower jaw.   C/o rhinorrhea/ sneezing,   NO new sx of ST, and cough.     Denies objective F/C,  No N/V/D, No SOB/DIB, No Rash.     has not taken anything for sx other than claritin for allergies 2 days now.   Overall getting same- .  2)  OF note-   pt recently seen in ED :  - was told to f/up with PCP but pt never did.   Admit date: 04/26/2016  Discharge date: 04/29/2016  Admission Diagnoses: L BKA stump abscess; DM type 1; HTN; hyperlipidemia; GERD; hx of charcot foot; hx of L BKA; morbid obesity; vit D deficiency; hx of diabetic ketoacidosis; hx of MRSA infection; hx of osteomyelitis; hx of diabetic foot ulcer; hx of normocytic anemia  Discharge Diagnoses:  Principal Problem:   Abscess of left leg Active Problems:   Diabetes mellitus type 1 (HCC)   Essential hypertension   Hyperlipidemia   H/O noncompliance with medical treatment, presenting hazards to health   Diabetes mellitus with complication (HCC) as stated above   3)    Referred on 08/25/15 to Endo for care of hi poorly controlled DM. =--->  Dr Wyonia Hough;  Seen 10/12/15 and never again.  --->  TOld to f/up 6 wks--> on 11/23/15 and pt never did.       Patient Active Problem List   Diagnosis Date Noted  . Hx of BKA, left - with subsequent cellulitis and treatment for osteomylitis 06/01/2016    Priority: High  . Morbidly obese (HCC) 09/22/2015    Priority: High  . Diabetes mellitus type 1 (HCC)     Priority: High  . Essential hypertension 05/03/2013    Priority: High  . Hyperlipidemia 07/26/2012    Priority: High  . Low HDL (under 40) 09/22/2015    Priority: Medium  . GERD (gastroesophageal reflux disease)     Priority: Medium  . Hx MRSA infection 09/22/2015    Priority: Low  . Abscess of left leg 04/26/2016  . Diabetes mellitus with complication (HCC)   . Normocytic anemia 01/01/2016  . Osteomyelitis (HCC) 12/31/2015  . 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 Ankle wound 12/31/2015  . Chronic osteomyelitis of left ankle with draining sinus (HCC)   . Type 1 diabetes, uncontrolled, with Charcot's joint of foot (HCC) 10/12/2015  . H/O noncompliance with medical treatment, presenting hazards to health 09/22/2015  . Vitamin D deficiency 09/22/2015  . Charcot's joint of foot 11/25/2013  . Diabetic ketoacidosis (HCC)   . Meniscus tear   . Epigastric pain 12/10/2011  . Vomiting 12/10/2011  . Problems influencing health status 11/21/2011    Past medical history, Surgical history, Family history reviewed and noted below, Social history, Allergies, and Medications have been entered into the medical record, reviewed and changed as needed.   Allergies  Allergen Reactions  . Influenza Vac Split Quad Nausea And Vomiting  . Hydrochlorothiazide     Dizziness  . Losartan     Dizziness    Review of Systems: General:   No F/C, wt loss Pulm:   No DIB, pleuritic chest pain Card:  No  CP, palpitations Abd:  No n/v/d or pain Ext:  No inc edema from baseline   Objective:   Blood pressure (!) 143/88, pulse (!) 101, temperature 98.9 F (37.2 C), temperature source Oral, SpO2 95 %. There is no height or weight on file to calculate BMI. General: Well Developed, well nourished, appropriate for stated age.  Neuro: Alert and oriented x3, extra-ocular muscles intact, sensation grossly intact.  HEENT: Normocephalic, atraumatic, pupils equal round reactive to light, neck supple, no masses, no painful lymphadenopathy, TM's intact B/L,-left TM obscured with  opaque sion and bulge, discomfort with tragal retraction.  no  acute findings on nonaffected side.  Nares- patent, clear d/c, erythematous and edematous nares bilaterally.  Left greater than right., OP- clear, mild erythema, +  TTP Left maxillary and mandibular sinuses Skin: Warm and dry, no gross rash. Cardiac: RRR, S1 S2,  no murmurs rubs or gallops.  Respiratory: ECTA B/L and A/P, with only mild rhonchi at his bilateral bases.  Not using accessory muscles, speaking in full sentences- unlabored. Vascular:  No gross lower ext edema, cap RF less 2 sec. Psych: No HI/SI, judgement and insight good, Euthymic mood. Full Affect.   Patient Care Team    Relationship Specialty Notifications Start End  Thomasene Lot, DO PCP - General Family Medicine All results, Admissions 12/31/15   Michael Boston, PA Attending Physician Orthopedic Surgery  06/01/16

## 2016-06-02 DIAGNOSIS — Z5181 Encounter for therapeutic drug level monitoring: Secondary | ICD-10-CM | POA: Diagnosis not present

## 2016-06-02 DIAGNOSIS — Z792 Long term (current) use of antibiotics: Secondary | ICD-10-CM | POA: Diagnosis not present

## 2016-06-02 DIAGNOSIS — T8744 Infection of amputation stump, left lower extremity: Secondary | ICD-10-CM | POA: Diagnosis not present

## 2016-06-07 ENCOUNTER — Encounter: Payer: BLUE CROSS/BLUE SHIELD | Admitting: Physical Therapy

## 2016-06-08 ENCOUNTER — Encounter: Payer: BLUE CROSS/BLUE SHIELD | Admitting: Physical Therapy

## 2016-06-08 ENCOUNTER — Inpatient Hospital Stay: Payer: BLUE CROSS/BLUE SHIELD | Admitting: Infectious Diseases

## 2016-06-09 ENCOUNTER — Ambulatory Visit (INDEPENDENT_AMBULATORY_CARE_PROVIDER_SITE_OTHER): Payer: BLUE CROSS/BLUE SHIELD | Admitting: Adult Health

## 2016-06-09 ENCOUNTER — Encounter: Payer: Self-pay | Admitting: Adult Health

## 2016-06-09 DIAGNOSIS — H6692 Otitis media, unspecified, left ear: Secondary | ICD-10-CM | POA: Insufficient documentation

## 2016-06-09 MED ORDER — ACETIC ACID 2 % OT SOLN: 4.0000 [drp] | Freq: Three times a day (TID) | OTIC | 0 refills | Status: DC

## 2016-06-09 NOTE — Assessment & Plan Note (Signed)
Complete course of doxycycline as directed. Use nasal spray as directed. Use ear drops as directed. Please call clinic with any other questions/concerns.

## 2016-06-09 NOTE — Patient Instructions (Signed)

## 2016-06-09 NOTE — Progress Notes (Signed)
Subjective:    Patient ID: Michael Hutchinson, male    DOB: [birth year], 41 y.o.   MRN: [id number]  HPI: Mr. Collard presents with continues L ear pain 3/10 that it dull and constant.  ID recently started him on Doxy '100mg'$  BID x 10 days-he has 3-4 days remaining of Rx.  He has not been using the nasal spray and reports only drinking 20-30 ounces water/daily. He reports sinus tenderness, however denies nasal drainage. He has appt with ID and Endocrinology soon. Wife and children at [facility].  Patient Care Team    Relationship Specialty Notifications Start End  Mellody Dance, DO PCP - General Family Medicine All results, Admissions 12/31/15   Fayette Pho, PA Attending Physician Orthopedic Surgery  06/01/16     Patient Active Problem List   Diagnosis Date Noted  . Acute left otitis media 06/09/2016  . Hx of BKA, left - with subsequent cellulitis and treatment for osteomylitis 06/01/2016  . Abscess of left leg 04/26/2016  . Diabetes mellitus with complication ([place])   . Normocytic anemia 01/01/2016  . Osteomyelitis ([place]) 12/31/2015  . Diabetic ulcer of left heel associated with type 1 diabetes mellitus, with necrosis of bone ([place]) 12/31/2015  . Ankle wound 12/31/2015  . Chronic osteomyelitis of left ankle with draining sinus ([place])   . Type 1 diabetes, uncontrolled, with Charcot's joint of foot ([place]) 10/12/2015  . Low HDL (under 40) 09/22/2015  . Morbidly obese ([place]) 09/22/2015  . H/O noncompliance with medical treatment, presenting hazards to health 09/22/2015  . Hx MRSA infection 09/22/2015  . Vitamin D deficiency 09/22/2015  . Diabetes mellitus type 1 ([place])   . Charcot's joint of foot 11/25/2013  . Essential hypertension 05/03/2013  . Hyperlipidemia 07/26/2012  . Diabetic ketoacidosis ([place])   . GERD (gastroesophageal reflux disease)   . Meniscus tear   . Epigastric pain 12/10/2011  . Vomiting 12/10/2011  . Problems influencing health status 11/21/2011     Past Medical History:   Diagnosis Date  . Charcot's joint of foot 11/25/2013  . Complication of anesthesia    "I wake up angry" (12/31/2015)  . Diabetes mellitus type 1 ([place]) dx'd 1981  . Diabetic ketoacidosis ([place])   . Essential hypertension 05/03/2013  . GERD (gastroesophageal reflux disease)   . High cholesterol   . Hx MRSA infection    Inner thigh and under arm- healed areas  . Meniscus tear   . Shortness of breath    with exertion only     Past Surgical History:  Procedure Laterality Date  . AMPUTATION Left 01/01/2016   Procedure: AMPUTATION BELOW KNEE;  Surgeon: [name], MD;  Location: [place];  Service: Orthopedics;  Laterality: Left;  . APPLICATION OF WOUND VAC  04/26/2016  . FRACTURE SURGERY    . HERNIA REPAIR    . I&D EXTREMITY Left 04/26/2016   Procedure: IRRIGATION AND DEBRIDEMENT EXTREMITY/Left Leg/Possible Wound Vac;  Surgeon: [name], MD;  Location: [place];  Service: Orthopedics;  Laterality: Left;  . INCISE AND DRAIN ABCESS Left 04/26/2016  . KNEE ARTHROSCOPY Left ~ 2010  . LAPAROSCOPIC CHOLECYSTECTOMY  2015  . METACARPOPHALANGEAL JOINT ARTHRODESIS Left 06/2012   Fracture left index finger intra-articular MCP joint/notes 06/30/2012  . OPEN REDUCTION INTERNAL FIXATION (ORIF) PROXIMAL PHALANX Left 06/30/2012   Procedure: OPEN REDUCTION INTERNAL FIXATION (ORIF) LEFT INDEX FINGER PROXIMAL PHALANX FRACTURE WITH LIGAMENT REPAIR AS NECESSARY;  Surgeon: [name], MD;  Location: [place];  Service: Orthopedics;  Laterality: Left;  .  UMBILICAL HERNIA REPAIR  2015   "w/gallbladder OR"     Family History  Problem Relation Age of Onset  . Other Mother   . Cancer Mother        Breast / Bone  . Heart attack Father   . Hypertension Father   . Hyperlipidemia Father   . Diabetes Unknown   . Alcohol abuse Sister   . Diabetes Maternal Grandfather   . Stroke Paternal Grandmother   . Alcohol abuse Paternal Grandfather      History  Drug Use No     History  Alcohol Use  . 8.4  oz/week  . 14 Cans of beer per week     History  Smoking Status  . Never Smoker  Smokeless Tobacco  . Former Systems developer  . Types: Snuff, Chew  . Quit date: 08/25/2010     Outpatient Encounter Prescriptions as of 06/09/2016  Medication Sig  . atorvastatin (LIPITOR) 40 MG tablet TAKE 1 TABLET BY MOUTH DAILY AT 6 PM.  . BAYER MICROLET LANCETS lancets Use as instructed to check 3 times daily.  . Blood Glucose Monitoring Suppl (ONE TOUCH ULTRA SYSTEM KIT) w/Device KIT 1 kit by Does not apply route once. Check blood sugar 3 times daily. Diagnosis Diabetes ICD-10 E11.8  . doxycycline (VIBRA-TABS) 100 MG tablet Take 1 tablet (100 mg total) by mouth 2 (two) times daily.  Marland Kitchen glucagon (GLUCAGON EMERGENCY) 1 MG injection Inject 1 mg into the muscle once as needed.  Marland Kitchen glucose blood (BAYER CONTOUR NEXT TEST) test strip Use as instructed to check 3 times daily.  Marland Kitchen ibuprofen (ADVIL,MOTRIN) 800 MG tablet Take 1 tablet (800 mg total) by mouth 3 (three) times daily.  . insulin glargine (LANTUS) 100 UNIT/ML injection Inject 40 units at bedtime  . insulin lispro (HUMALOG) 100 UNIT/ML injection Inject 0.03-0.2 mLs (3-20 Units total) into the skin 3 (three) times daily before meals. Sliding scale per carb count 4 units for every 30 carbs  . Insulin Pen Needle (BD PEN NEEDLE NANO U/F) 32G X 4 MM MISC 1 pen by Does not apply route 3 (three) times daily.  . traMADol (ULTRAM) 50 MG tablet Take 1 tablet (50 mg total) by mouth every 6 (six) hours as needed for moderate pain.  Marland Kitchen acetic acid (VOSOL) 2 % otic solution Place 4 drops into the left ear 3 (three) times daily.   No facility-administered encounter medications on file as of 06/09/2016.     Allergies: Influenza vac split quad; Hydrochlorothiazide; and Losartan  There is no height or weight on file to calculate BMI.  Blood pressure (!) 145/92, pulse (!) 101, temperature 98.2 F (36.8 C), temperature source Oral.   Review of Systems  Constitutional:  Positive for activity change and fatigue. Negative for appetite change, chills, diaphoresis, fever and unexpected weight change.  HENT: Positive for ear pain and sinus pressure. Negative for congestion, postnasal drip, sneezing, sore throat and voice change.   Eyes: Negative for visual disturbance.  Respiratory: Negative for cough, chest tightness, shortness of breath, wheezing and stridor.   Cardiovascular: Negative for chest pain, palpitations and leg swelling.  Endocrine: Negative for cold intolerance, heat intolerance, polydipsia, polyphagia and polyuria.  Musculoskeletal: Positive for gait problem.  Skin: Negative for color change, pallor, rash and wound.  Hematological: Does not bruise/bleed easily.       Objective:   Physical Exam  Constitutional: He is oriented to person, place, and time. He appears well-developed and well-nourished. No distress.  HENT:  Head: Normocephalic and atraumatic.  Right Ear: Hearing, tympanic membrane, external ear and ear canal normal. No foreign bodies. Tympanic membrane is not erythematous and not bulging.  Left Ear: Hearing, external ear and ear canal normal. No foreign bodies. Tympanic membrane is erythematous and bulging. Tympanic membrane is not retracted.  Nose: Rhinorrhea present. Right sinus exhibits maxillary sinus tenderness and frontal sinus tenderness. Left sinus exhibits maxillary sinus tenderness and frontal sinus tenderness.  Mouth/Throat: Uvula is midline.  Excessive cerumen in canals bilaterally. Flaky/dry skin noted on ext ears bilaterally.  Eyes: Conjunctivae are normal. Pupils are equal, round, and reactive to light.  Musculoskeletal: He exhibits edema.  L BKA-dressing dry/intact  Neurological: He is alert and oriented to person, place, and time.  Skin: Skin is warm and dry. No rash noted. He is not diaphoretic. No erythema. No pallor.  Psychiatric: He has a normal mood and affect. His behavior is normal. Judgment and thought  content normal.  Nursing note and vitals reviewed.         Assessment & Plan:   1. Acute left otitis media     Acute left otitis media Complete course of doxycycline as directed. Use nasal spray as directed. Use ear drops as directed. Please call clinic with any other questions/concerns.    FOLLOW-UP:  Return if symptoms worsen or fail to improve.

## 2016-06-14 ENCOUNTER — Ambulatory Visit: Payer: BLUE CROSS/BLUE SHIELD | Attending: Orthopedic Surgery | Admitting: Physical Therapy

## 2016-06-14 ENCOUNTER — Encounter: Payer: Self-pay | Admitting: Physical Therapy

## 2016-06-14 DIAGNOSIS — R2681 Unsteadiness on feet: Secondary | ICD-10-CM | POA: Diagnosis not present

## 2016-06-14 DIAGNOSIS — M6281 Muscle weakness (generalized): Secondary | ICD-10-CM | POA: Diagnosis not present

## 2016-06-14 DIAGNOSIS — R2689 Other abnormalities of gait and mobility: Secondary | ICD-10-CM | POA: Diagnosis not present

## 2016-06-15 ENCOUNTER — Ambulatory Visit: Payer: BLUE CROSS/BLUE SHIELD | Admitting: Physical Therapy

## 2016-06-15 NOTE — Therapy (Signed)
[facility] [address] [place], [place], [address] Phone: [phone]   Fax:  [phone]  Physical Therapy Treatment  Patient Details  Name: Michael Hutchinson MRN: [id number] Date of Birth: 12/07/1975 Referring Provider: Dr. Toni Arthurs  Encounter Date: 06/14/2016      PT End of Session - 06/14/16 1926    Visit Number 2   Number of Visits 21   Date for PT Re-Evaluation 08/19/16   Authorization Type [employer] 30 visit limit with 23 visits remaining   Authorization - Visit Number 2   Authorization - Number of Visits 23   PT Start Time 1530   PT Stop Time 1623   PT Time Calculation (min) 53 min   Equipment Utilized During Treatment Gait belt   Activity Tolerance Patient tolerated treatment well   Behavior During Therapy WFL for tasks assessed/performed      Past Medical History:  Diagnosis Date  . Charcot's joint of foot 11/25/2013  . Complication of anesthesia    "I wake up angry" (12/31/2015)  . Diabetes mellitus type 1 ([facility]) dx'd 1981  . Diabetic ketoacidosis ([facility])   . Essential hypertension 05/03/2013  . GERD (gastroesophageal reflux disease)   . High cholesterol   . Hx MRSA infection    Inner thigh and under arm- healed areas  . Meniscus tear   . Shortness of breath    with exertion only    Past Surgical History:  Procedure Laterality Date  . AMPUTATION Left 01/01/2016   Procedure: AMPUTATION BELOW KNEE;  Surgeon: Toni Arthurs, MD;  [facility];  Service: Orthopedics;  Laterality: Left;  . APPLICATION OF WOUND VAC  04/26/2016  . FRACTURE SURGERY    . HERNIA REPAIR    . I&D EXTREMITY Left 04/26/2016   Procedure: IRRIGATION AND DEBRIDEMENT EXTREMITY/Left Leg/Possible Wound Vac;  Surgeon: Toni Arthurs, MD;  Location: [facility];  Service: Orthopedics;  Laterality: Left;  . INCISE AND DRAIN ABCESS Left 04/26/2016  . KNEE ARTHROSCOPY Left ~ 2010  . LAPAROSCOPIC CHOLECYSTECTOMY  2015  . METACARPOPHALANGEAL JOINT  ARTHRODESIS Left 06/2012   Fracture left index finger intra-articular MCP joint/notes 06/30/2012  . OPEN REDUCTION INTERNAL FIXATION (ORIF) PROXIMAL PHALANX Left 06/30/2012   Procedure: OPEN REDUCTION INTERNAL FIXATION (ORIF) LEFT INDEX FINGER PROXIMAL PHALANX FRACTURE WITH LIGAMENT REPAIR AS NECESSARY;  Surgeon: Dominica Severin, MD;  [facility];  Service: Orthopedics;  Laterality: Left;  . UMBILICAL HERNIA REPAIR  2015   "w/gallbladder OR"    There were no vitals filed for this visit.      Subjective Assessment - 06/14/16 1534    Subjective He got abcess on limb so was hospitalized for 4 days including I & D. He resumed prosthesis wear 4 days ago and progressed to all day yesterday.    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 No/denies                         [facility] Adult PT Treatment/Exercise - 06/14/16 1530      Transfers   Transfers Sit to Stand;Stand to Sit   Sit to Stand 5:  Supervision;With upper extremity assist;From chair/3-in-1   Stand to Sit 5: Supervision;With upper extremity assist;To chair/3-in-1     Ambulation/Gait   Ambulation/Gait Yes   Ambulation/Gait Assistance 4: Min assist;5: Supervision   Ambulation/Gait Assistance Details demo, verbal, visual & tactile cues on proper step width to decrease prosthesis abduction & sound limb external rotation / adduction. Initially using line on floor as reference and progressed to proprioception.  PT instructed in cane use including sequence & benefits to increasing prosthetic stance duration.    Ambulation Distance (Feet) 500 Feet   Assistive device Prosthesis;Straight cane;None  no  device with increased deviations   Gait Pattern Trunk flexed;Decreased step length - right;Decreased weight shift to left;Abducted - left;Step-to pattern;Decreased arm swing - left;Decreased stance time - left;Decreased stride length;Decreased hip/knee flexion - left;Left hip hike;Right flexed knee in stance;Left flexed knee in stance;Antalgic;Lateral hip instability   Ambulation Surface Indoor;Level   Stairs Yes   Stairs Assistance 5: Supervision   Stairs Assistance Details (indicate cue type and reason) verbal & demo technique including sequence, wt shift with modified technique until improves stability & pressure tolerance with prosthesis   Stair Management Technique One rail Right;With cane;Step to pattern;Forwards  prosthesis   Number of Stairs 4   Ramp 4: Min assist  cane & prosthesis   Ramp Details (indicate cue type and reason) verbal & demo cues on technique including sequence, wt shift, posture, foot position & safety   Curb 4: Min assist  curb & prosthesis   Curb Details (indicate cue type and reason) verbal & demo technique including sequence, step thru foot position and balance     Prosthetics   Prosthetic Care Comments  PT demo & instructed in use of Tegaderm; signs of sweating & need to dry limb /liner. Dry limb & liner q3-4 hrs or sooner if signs of sweating.    Current prosthetic wear tolerance (days/week)  started wearing again daily 4 days ago when cleared by MD   Current prosthetic wear tolerance (#hours/day)  progressed over 4 days to all day yesterday.   PT recommended drying q3-4 hrs   Current prosthetic weight-bearing tolerance (hours/day)  patient tolerated standing 10 minutes with intermittent UE support with partial weight on prosthesis without complaint of pain or discomfort   Edema non-pitting    Residual limb condition  lateral wound with scab on incision with no signs of infection or drainage.    Education Provided Skin check;Residual limb care;Correct ply sock  adjustment;Proper Donning;Proper wear schedule/adjustment;Proper weight-bearing schedule/adjustment;Other (comment)  see prosthetic care comments   Person(s) Educated Patient;Spouse   Education Method Explanation;Demonstration;Tactile cues;Verbal cues   Education Method Verbalized understanding;Returned demonstration;Tactile cues required;Verbal cues required;Needs further instruction                  PT Short Term Goals - 06/14/16 1928      PT SHORT TERM GOAL #1   Title Patient wil verbalize understanding and adhere to proper prosthesis wear schedule and prosthesis care. (TARGET DATE: 07/15/2016)   Baseline PT reset target date with delayed treatment with wound on limb / hospitalization   Time 5   Period Weeks   Status On-going     PT SHORT TERM GOAL #2   Title Patient will verbalize understanding and demonstrate HEP activities and exercises. (TARGET DATE: 07/15/2016)   Baseline PT reset target date with delayed treatment with wound on limb / hospitalization   Time 5   Period Weeks   Status On-going  PT SHORT TERM GOAL #3   Title Patient will score >32/56 on Berg Balance to indicate a decrease in his risk of falling. (TARGET DATE: 07/15/2016)   Baseline PT reset target date with delayed treatment with wound on limb / hospitalization   Time 5   Period Weeks   Status On-going     PT SHORT TERM GOAL #4   Title Patient will demonstrate ability to ambulate on level, indoor surfaces for 250' with prosthesis and LRAD with supervision. (TARGET DATE: 07/15/2016)   Baseline PT reset target date with delayed treatment with wound on limb / hospitalization   Time 5   Period Weeks   Status On-going           PT Long Term Goals - 06/14/16 1945      PT LONG TERM GOAL #1   Title Patient will verbalize and demonstrate understanding of his ongoing HEP program and fitness. (TARGET DATE: 08/19/2016)   Baseline PT reset target date with delayed treatment with wound on limb /  hospitalization   Time 10   Period Weeks   Status On-going     PT LONG TERM GOAL #2   Title Patient will score >44/56 on the Berg Balance test to indicate a decrease in his risk of falling. (TARGET DATE: 08/19/2016)   Baseline PT reset target date with delayed treatment with wound on limb / hospitalization   Time 10   Period Weeks   Status On-going     PT LONG TERM GOAL #3   Title Patient will perform a TUG with prosthesis only in <13s to indicate a decrease in his risk of falling. (TARGET DATE: 08/19/2016)   Baseline PT reset target date with delayed treatment with wound on limb / hospitalization   Time 10   Period Weeks   Status On-going     PT LONG TERM GOAL #4   Title Patient will complete an Functional Gait Assessment with a score of >18 to indicate a decrease in his risk of falling. (TARGET DATE: 08/19/2016)   Baseline PT reset target date with delayed treatment with wound on limb / hospitalization   Time 10   Period Weeks   Status On-going     PT LONG TERM GOAL #5   Title Patient will ambulate 1000' on unlevel surfaces (outdoors, ramps, curbs, and grass) with LRAD and prosthesis. (TARGET DATE: 08/19/2016)   Baseline PT reset target date with delayed treatment with wound on limb / hospitalization   Time 10   Period Weeks   Status On-going     PT LONG TERM GOAL #6   Title Patient performs functional task of lifting, pushing, pulling with prosthesis safely.  (Target Date: 08/19/2016)   Time 10   Period Weeks   Status On-going               Plan - 06/14/16 1947    Clinical Impression Statement Patient missed 8 weeks of care due to abcess on residual limb requiring surgery and removal of prosthesis until 4 days ago. He tolerated weight bearing & gait with prosthesis without wound issues today using Tegaderm as barrier to scabbed wound. Patient improved gait with prosthesis including understanding of basics of negotiation of stairs, ramps & curbs.    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   Clinical Presentation  Evolving   Clinical Presentation due to: limited understanding of new prosthesis and wound on residual limb   Clinical Decision Making Moderate   Rehab Potential Good   Clinical Impairments Affecting Rehab Potential DM type 1, charcot foot, HTN, HLD   PT Frequency 2x / week   PT Duration Other (comment)  10 weeks   PT Treatment/Interventions ADLs/Self Care Home Management;DME Instruction;Gait training;Stair training;Functional mobility training;Therapeutic activities;Therapeutic exercise;Balance training;Prosthetic Training;Patient/family education;Neuromuscular re-education;Scar mobilization   PT Next Visit Plan review prosthesis education on care and wear schedule, begin balance and mobility prosthetic activities, HEP at sink for midline   Consulted and Agree with Plan of Care Patient;Family member/caregiver   Family Member Consulted Wife - Marchelle Folks       Patient will benefit from skilled therapeutic intervention in order to improve the following deficits and impairments:  Abnormal gait, Decreased activity tolerance, Decreased balance, Decreased knowledge of precautions, Decreased knowledge of use of DME, Decreased mobility, Difficulty walking, Decreased strength, Decreased skin integrity, Postural dysfunction, Pain  Visit Diagnosis: Other abnormalities of gait and mobility  Unsteadiness on feet  Muscle weakness (generalized)     Problem List Patient Active Problem List   Diagnosis Date Noted  . Acute left otitis media 06/09/2016  . Hx of BKA, left - with subsequent cellulitis and treatment for osteomylitis 06/01/2016  . Abscess of left leg 04/26/2016  . Diabetes mellitus with complication ([facility])   . Normocytic anemia 01/01/2016  . Osteomyelitis ([facility]) 12/31/2015  .  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 Ankle wound 12/31/2015  . Chronic osteomyelitis of left ankle with draining sinus ([facility])   . Type 1 diabetes, uncontrolled, with Charcot's joint of foot ([facility]) 10/12/2015  . Low HDL (under 40) 09/22/2015  . Morbidly obese ([facility]) 09/22/2015  . H/O noncompliance with medical treatment, presenting hazards to health 09/22/2015  . Hx MRSA infection 09/22/2015  . Vitamin D deficiency 09/22/2015  . Diabetes mellitus type 1 ([facility])   . Charcot's joint of foot 11/25/2013  . Essential hypertension 05/03/2013  . Hyperlipidemia 07/26/2012  . Diabetic ketoacidosis ([facility])   . GERD (gastroesophageal reflux disease)   . Meniscus tear   . Epigastric pain 12/10/2011  . Vomiting 12/10/2011  . Problems influencing health status 11/21/2011    Vladimir Faster PT, DPT 06/15/2016, 10:00 AM  [facility] [address] [place], [place], [address] Phone: [phone]   Fax:  [phone]  Name: Michael Hutchinson MRN: [id number] Date of Birth: November 06, 1975

## 2016-06-17 ENCOUNTER — Ambulatory Visit: Payer: BLUE CROSS/BLUE SHIELD | Admitting: Physical Therapy

## 2016-06-21 ENCOUNTER — Encounter: Payer: Self-pay | Admitting: Physical Therapy

## 2016-06-21 ENCOUNTER — Ambulatory Visit: Payer: BLUE CROSS/BLUE SHIELD | Admitting: Physical Therapy

## 2016-06-21 DIAGNOSIS — M6281 Muscle weakness (generalized): Secondary | ICD-10-CM

## 2016-06-21 DIAGNOSIS — R2681 Unsteadiness on feet: Secondary | ICD-10-CM | POA: Diagnosis not present

## 2016-06-21 DIAGNOSIS — R2689 Other abnormalities of gait and mobility: Secondary | ICD-10-CM

## 2016-06-21 NOTE — Patient Instructions (Signed)

## 2016-06-22 NOTE — Therapy (Signed)
[facility] [address] [place], [place], [address] Phone: [phone]   Fax:  [phone]  Physical Therapy Treatment  Patient Details  Name: Michael Hutchinson MRN: [id number] Date of Birth: [birth year] Referring Provider: Dr. Toni Arthurs  Encounter Date: 06/21/2016      PT End of Session - 06/21/16 1547    Visit Number 3   Number of Visits 21   Date for PT Re-Evaluation 08/19/16   Authorization Type [employer] 30 visit limit with 23 visits remaining   Authorization - Visit Number 3   Authorization - Number of Visits 23   PT Start Time 1533   PT Stop Time 1615   PT Time Calculation (min) 42 min   Equipment Utilized During Treatment Gait belt   Activity Tolerance Patient tolerated treatment well   Behavior During Therapy WFL for tasks assessed/performed      Past Medical History:  Diagnosis Date  . Charcot's joint of foot 11/25/2013  . Complication of anesthesia    "I wake up angry" (12/31/2015)  . Diabetes mellitus type 1 (HCC) dx'd 1981  . Diabetic ketoacidosis (HCC)   . Essential hypertension 05/03/2013  . GERD (gastroesophageal reflux disease)   . High cholesterol   . Hx MRSA infection    Inner thigh and under arm- healed areas  . Meniscus tear   . Shortness of breath    with exertion only    Past Surgical History:  Procedure Laterality Date  . AMPUTATION Left 01/01/2016   Procedure: AMPUTATION BELOW KNEE;  Surgeon: Toni Arthurs, MD;  Location: [facility] OR;  Service: Orthopedics;  Laterality: Left;  . APPLICATION OF WOUND VAC  04/26/2016  . FRACTURE SURGERY    . HERNIA REPAIR    . I&D EXTREMITY Left 04/26/2016   Procedure: IRRIGATION AND DEBRIDEMENT EXTREMITY/Left Leg/Possible Wound Vac;  Surgeon: Toni Arthurs, MD;  Location: [facility] OR;  Service: Orthopedics;  Laterality: Left;  . INCISE AND DRAIN ABCESS Left 04/26/2016  . KNEE ARTHROSCOPY Left ~ 2010  . LAPAROSCOPIC CHOLECYSTECTOMY  2015  . METACARPOPHALANGEAL  JOINT ARTHRODESIS Left 06/2012   Fracture left index finger intra-articular MCP joint/notes 06/30/2012  . OPEN REDUCTION INTERNAL FIXATION (ORIF) PROXIMAL PHALANX Left 06/30/2012   Procedure: OPEN REDUCTION INTERNAL FIXATION (ORIF) LEFT INDEX FINGER PROXIMAL PHALANX FRACTURE WITH LIGAMENT REPAIR AS NECESSARY;  Surgeon: Dominica Severin, MD;  Location: [facility] OR;  Service: Orthopedics;  Laterality: Left;  . UMBILICAL HERNIA REPAIR  2015   "w/gallbladder OR"    There were no vitals filed for this visit.      Subjective Assessment - 06/21/16 1536    Subjective No falls. Having pain at tibial crest with prosthesis wear only, 3-4/10 with weight bearing. 0/10 at rest.    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 No/denies          [facility] Adult PT Treatment/Exercise - 06/21/16 1550      Transfers   Transfers Sit to Stand;Stand to Sit   Sit to Stand 5: Supervision;With upper extremity assist;From chair/3-in-1   Stand to Sit 5: Supervision;With upper extremity assist;To chair/3-in-1     Ambulation/Gait   Ambulation/Gait Yes  Ambulation/Gait Assistance 5: Supervision   Ambulation/Gait Assistance Details cues on step length and weight shifitng with stance. pt noted to have a slight medial pylon lean in stance phase of gait.                              Ambulation Distance (Feet) 220 Feet   Assistive device Prosthesis;Straight cane   Gait Pattern Step-through pattern   Ambulation Surface Level;Indoor   Stairs Yes   Stairs Assistance 5: Supervision   Stairs Assistance Details (indicate cue type and reason) cues to advance hands along rails with movements and for weight  shifting with stair negotiation.                        Stair Management Technique One rail Right;One rail Left;Step to pattern;With cane   Number of Stairs 4  x2 reps   Ramp Other (comment)  min guard assist   Ramp Details (indicate cue type and reason) cues on posture and weight shifting with negotiation.      Prosthetics   Current prosthetic wear tolerance (days/week)  daily   Current prosthetic wear tolerance (#hours/day)  all awake hours with breaks to dry through out day   Education Provided Residual limb care;Proper wear schedule/adjustment;Proper weight-bearing schedule/adjustment;Correct ply sock adjustment   Person(s) Educated Patient;Spouse   Education Method Explanation;Demonstration;Verbal cues   Education Method Verbalized understanding;Verbal cues required   Donning Prosthesis Supervision   Doffing Prosthesis Supervision            PT Education - 06/21/16 1601    Education provided Yes   Education Details sink HEP for balance and proprioception   Person(s) Educated Patient;Spouse   Methods Explanation;Demonstration;Verbal cues;Handout   Comprehension Verbalized understanding;Returned demonstration          PT Short Term Goals - 06/14/16 1928      PT SHORT TERM GOAL #1   Title Patient wil verbalize understanding and adhere to proper prosthesis wear schedule and prosthesis care. (TARGET DATE: 07/15/2016)   Baseline PT reset target date with delayed treatment with wound on limb / hospitalization   Time 5   Period Weeks   Status On-going     PT SHORT TERM GOAL #2   Title Patient will verbalize understanding and demonstrate HEP activities and exercises. (TARGET DATE: 07/15/2016)   Baseline PT reset target date with delayed treatment with wound on limb / hospitalization   Time 5   Period Weeks   Status On-going     PT SHORT TERM GOAL #3   Title Patient will score >32/56 on Berg Balance to indicate a decrease in his risk of falling. (TARGET DATE: 07/15/2016)    Baseline PT reset target date with delayed treatment with wound on limb / hospitalization   Time 5   Period Weeks   Status On-going     PT SHORT TERM GOAL #4   Title Patient will demonstrate ability to ambulate on level, indoor surfaces for 250' with prosthesis and LRAD with supervision. (TARGET DATE: 07/15/2016)   Baseline PT reset target date with delayed treatment with wound on limb / hospitalization   Time 5   Period Weeks   Status On-going           PT Long Term Goals - 06/14/16 1945      PT LONG TERM GOAL #1   Title Patient will verbalize and demonstrate understanding of his ongoing HEP  program and fitness. (TARGET DATE: 08/19/2016)   Baseline PT reset target date with delayed treatment with wound on limb / hospitalization   Time 10   Period Weeks   Status On-going     PT LONG TERM GOAL #2   Title Patient will score >44/56 on the Berg Balance test to indicate a decrease in his risk of falling. (TARGET DATE: 08/19/2016)   Baseline PT reset target date with delayed treatment with wound on limb / hospitalization   Time 10   Period Weeks   Status On-going     PT LONG TERM GOAL #3   Title Patient will perform a TUG with prosthesis only in <13s to indicate a decrease in his risk of falling. (TARGET DATE: 08/19/2016)   Baseline PT reset target date with delayed treatment with wound on limb / hospitalization   Time 10   Period Weeks   Status On-going     PT LONG TERM GOAL #4   Title Patient will complete an Functional Gait Assessment with a score of >18 to indicate a decrease in his risk of falling. (TARGET DATE: 08/19/2016)   Baseline PT reset target date with delayed treatment with wound on limb / hospitalization   Time 10   Period Weeks   Status On-going     PT LONG TERM GOAL #5   Title Patient will ambulate 1000' on unlevel surfaces (outdoors, ramps, curbs, and grass) with LRAD and prosthesis. (TARGET DATE: 08/19/2016)   Baseline PT reset target date with delayed treatment  with wound on limb / hospitalization   Time 10   Period Weeks   Status On-going     PT LONG TERM GOAL #6   Title Patient performs functional task of lifting, pushing, pulling with prosthesis safely.  (Target Date: 08/19/2016)   Time 10   Period Weeks   Status On-going            Plan - 06/21/16 1548    Clinical Impression Statement Today's skilled session focused on use of prosthesis with gait and barriers. Pt with reports of pain on anterior shin/tibial crest of limb with prosthetic use/weight bearing. Trialed multiple ply's of socks with no change reported. Pt is using up to 5 ply on arising and donning prosthesis. Pt advised to call prosthetist for pads to be added to socket and for an alingment adjustment as well. Pt is progressing toward goals and should progress toward unmet goals.                                     Rehab Potential Good   Clinical Impairments Affecting Rehab Potential DM type 1, charcot foot, HTN, HLD   PT Frequency 2x / week   PT Duration Other (comment)  10 weeks   PT Treatment/Interventions ADLs/Self Care Home Management;DME Instruction;Gait training;Stair training;Functional mobility training;Therapeutic activities;Therapeutic exercise;Balance training;Prosthetic Training;Patient/family education;Neuromuscular re-education;Scar mobilization   PT Next Visit Plan begin balance and mobility prosthetic activities,    Consulted and Agree with Plan of Care Patient;Family member/caregiver   Family Member Consulted Wife - Marchelle Folks       Patient will benefit from skilled therapeutic intervention in order to improve the following deficits and impairments:  Abnormal gait, Decreased activity tolerance, Decreased balance, Decreased knowledge of precautions, Decreased knowledge of use of DME, Decreased mobility, Difficulty walking, Decreased strength, Decreased skin integrity, Postural dysfunction, Pain  Visit Diagnosis: Other abnormalities of gait and  mobility  Unsteadiness on feet  Muscle weakness (generalized)     Problem List Patient Active Problem List   Diagnosis Date Noted  . Acute left otitis media 06/09/2016  . Hx of BKA, left - with subsequent cellulitis and treatment for osteomylitis 06/01/2016  . Abscess of left leg 04/26/2016  . Diabetes mellitus with complication (HCC)   . Normocytic anemia 01/01/2016  . Osteomyelitis (HCC) 12/31/2015  . 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 Ankle wound 12/31/2015  . Chronic osteomyelitis of left ankle with draining sinus (HCC)   . Type 1 diabetes, uncontrolled, with Charcot's joint of foot (HCC) 10/12/2015  . Low HDL (under 40) 09/22/2015  . Morbidly obese (HCC) 09/22/2015  . H/O noncompliance with medical treatment, presenting hazards to health 09/22/2015  . Hx MRSA infection 09/22/2015  . Vitamin D deficiency 09/22/2015  . Diabetes mellitus type 1 (HCC)   . Charcot's joint of foot 11/25/2013  . Essential hypertension 05/03/2013  . Hyperlipidemia 07/26/2012  . Diabetic ketoacidosis (HCC)   . GERD (gastroesophageal reflux disease)   . Meniscus tear   . Epigastric pain 12/10/2011  . Vomiting 12/10/2011  . Problems influencing health status 11/21/2011   Sallyanne Kuster, PTA, [facility] [address] [place], [place] [address] [phone] 06/22/16, 7:54 PM   Name: Michael Hutchinson MRN: [id number] Date of Birth: [birth year]

## 2016-06-23 ENCOUNTER — Ambulatory Visit: Payer: BLUE CROSS/BLUE SHIELD | Admitting: Internal Medicine

## 2016-06-23 ENCOUNTER — Encounter: Payer: Self-pay | Admitting: Physical Therapy

## 2016-06-23 ENCOUNTER — Ambulatory Visit: Payer: BLUE CROSS/BLUE SHIELD | Admitting: Physical Therapy

## 2016-06-23 DIAGNOSIS — R2681 Unsteadiness on feet: Secondary | ICD-10-CM | POA: Diagnosis not present

## 2016-06-23 DIAGNOSIS — R2689 Other abnormalities of gait and mobility: Secondary | ICD-10-CM | POA: Diagnosis not present

## 2016-06-23 DIAGNOSIS — M6281 Muscle weakness (generalized): Secondary | ICD-10-CM | POA: Diagnosis not present

## 2016-06-23 DIAGNOSIS — Z0289 Encounter for other administrative examinations: Secondary | ICD-10-CM

## 2016-06-24 ENCOUNTER — Encounter (HOSPITAL_COMMUNITY): Payer: Self-pay | Admitting: Emergency Medicine

## 2016-06-24 DIAGNOSIS — Z794 Long term (current) use of insulin: Secondary | ICD-10-CM | POA: Diagnosis not present

## 2016-06-24 DIAGNOSIS — Z720 Tobacco use: Secondary | ICD-10-CM | POA: Diagnosis not present

## 2016-06-24 DIAGNOSIS — R112 Nausea with vomiting, unspecified: Secondary | ICD-10-CM | POA: Diagnosis not present

## 2016-06-24 DIAGNOSIS — I10 Essential (primary) hypertension: Secondary | ICD-10-CM | POA: Insufficient documentation

## 2016-06-24 DIAGNOSIS — E109 Type 1 diabetes mellitus without complications: Secondary | ICD-10-CM | POA: Insufficient documentation

## 2016-06-24 DIAGNOSIS — R1084 Generalized abdominal pain: Secondary | ICD-10-CM | POA: Diagnosis not present

## 2016-06-24 DIAGNOSIS — Z89432 Acquired absence of left foot: Secondary | ICD-10-CM | POA: Insufficient documentation

## 2016-06-24 DIAGNOSIS — R61 Generalized hyperhidrosis: Secondary | ICD-10-CM | POA: Insufficient documentation

## 2016-06-24 DIAGNOSIS — M549 Dorsalgia, unspecified: Secondary | ICD-10-CM | POA: Insufficient documentation

## 2016-06-24 DIAGNOSIS — G43A0 Cyclical vomiting, not intractable: Secondary | ICD-10-CM | POA: Diagnosis not present

## 2016-06-24 LAB — COMPREHENSIVE METABOLIC PANEL
ALT: 19 U/L (ref 17–63)
AST: 20 U/L (ref 15–41)
Albumin: 3.9 g/dL (ref 3.5–5.0)
Alkaline Phosphatase: 84 U/L (ref 38–126)
Anion gap: 9 (ref 5–15)
BUN: 16 mg/dL (ref 6–20)
CHLORIDE: 100 mmol/L — AB (ref 101–111)
CO2: 24 mmol/L (ref 22–32)
CREATININE: 1.21 mg/dL (ref 0.61–1.24)
Calcium: 8.9 mg/dL (ref 8.9–10.3)
Glucose, Bld: 150 mg/dL — ABNORMAL HIGH (ref 65–99)
Potassium: 4.2 mmol/L (ref 3.5–5.1)
Sodium: 133 mmol/L — ABNORMAL LOW (ref 135–145)
TOTAL PROTEIN: 6.8 g/dL (ref 6.5–8.1)
Total Bilirubin: 0.5 mg/dL (ref 0.3–1.2)

## 2016-06-24 LAB — CBC
HCT: 37.2 % — ABNORMAL LOW (ref 39.0–52.0)
Hemoglobin: 12.6 g/dL — ABNORMAL LOW (ref 13.0–17.0)
MCH: 29 pg (ref 26.0–34.0)
MCHC: 33.9 g/dL (ref 30.0–36.0)
MCV: 85.7 fL (ref 78.0–100.0)
PLATELETS: 289 10*3/uL (ref 150–400)
RBC: 4.34 MIL/uL (ref 4.22–5.81)
RDW: 12.4 % (ref 11.5–15.5)
WBC: 9.9 10*3/uL (ref 4.0–10.5)

## 2016-06-24 LAB — URINALYSIS, ROUTINE W REFLEX MICROSCOPIC
BILIRUBIN URINE: NEGATIVE
GLUCOSE, UA: NEGATIVE mg/dL
HGB URINE DIPSTICK: NEGATIVE
Ketones, ur: 5 mg/dL — AB
LEUKOCYTES UA: NEGATIVE
NITRITE: NEGATIVE
PH: 5 (ref 5.0–8.0)
Protein, ur: 30 mg/dL — AB
SPECIFIC GRAVITY, URINE: 1.015 (ref 1.005–1.030)
SQUAMOUS EPITHELIAL / LPF: NONE SEEN

## 2016-06-24 LAB — LIPASE, BLOOD: LIPASE: 21 U/L (ref 11–51)

## 2016-06-24 NOTE — ED Triage Notes (Signed)
Pt reports that after he ate dinner he experienced abdominal pain, emesis X2 and perfuse "sweating."  At this time he "feels better", pain only being a 4 and the nausea is "mostly gone."

## 2016-06-25 ENCOUNTER — Emergency Department (HOSPITAL_COMMUNITY)
Admission: EM | Admit: 2016-06-25 | Discharge: 2016-06-25 | Disposition: A | Discharge status: Home / Self Care | Payer: BLUE CROSS/BLUE SHIELD | Attending: Emergency Medicine | Admitting: Emergency Medicine

## 2016-06-25 ENCOUNTER — Encounter (HOSPITAL_COMMUNITY): Payer: Self-pay | Admitting: Emergency Medicine

## 2016-06-25 DIAGNOSIS — R1115 Cyclical vomiting syndrome unrelated to migraine: Secondary | ICD-10-CM

## 2016-06-25 DIAGNOSIS — R1084 Generalized abdominal pain: Secondary | ICD-10-CM

## 2016-06-25 LAB — TROPONIN I

## 2016-06-25 MED ORDER — GI COCKTAIL ~~LOC~~
30.0000 mL | Freq: Once | ORAL | Status: AC
  Administered 2016-06-25: 30 mL via ORAL
  Filled 2016-06-25: qty 30

## 2016-06-25 NOTE — ED Notes (Signed)
Pt reports a bout of nausea and vomiting shortly after eating at a restaurant. Pt reports his daughter also had the same symptoms and he ate some of her supper. Pt currently reports heartburn, pt has a hx of GERD.

## 2016-06-25 NOTE — ED Provider Notes (Signed)
[place] DEPT Provider Note   CSN: [id number] Arrival date & time: 06/24/16  2132  By signing my name below, I, Jaquelyn Bitter., attest that this documentation has been prepared under the direction and in the presence of Ripley Fraise, MD. Electronically signed: Jaquelyn Bitter., ED Scribe. 06/25/16. 3:24 AM.   History   Chief Complaint Chief Complaint  Patient presents with  . Abdominal Pain  . Emesis    HPI Michael Hutchinson is a 41 y.o. male with hx of L lower extremity amputation (x6 months), diabetes mellitus, GERD who presents to the Emergency Department complaining of emesis with onset x6 hours. Pt states that prior to eating dinner he felt fine but x20 minutes after eating, he became nauseous. He reportedly had x2 episodes of vomiting with associated diaphoresis. Pt reports back pain. He denies any modifying factors and states that vomiting and the BM provided mild relief. Pt denies diarrhea, blood in stool, chest pain, SOB, fever, groin pain, testicular pain.   The history is provided by the patient. No language interpreter was used.  Abdominal Pain   The current episode started 6 to 12 hours ago. The problem occurs constantly. The problem has been gradually improving. The pain is associated with suspicious food intake and eating. The pain is located in the generalized abdominal region. The pain is mild. Nothing aggravates the symptoms. The symptoms are relieved by vomiting and bowel activity. His past medical history is significant for GERD.    Past Medical History:  Diagnosis Date  . Charcot's joint of foot 11/25/2013  . Complication of anesthesia    "I wake up angry" (12/31/2015)  . Diabetes mellitus type 1 ([place]) dx'd 1981  . Diabetic ketoacidosis ([place])   . Essential hypertension 05/03/2013  . GERD (gastroesophageal reflux disease)   . High cholesterol   . Hx MRSA infection    Inner thigh and under arm- healed areas  . Meniscus tear   .  Shortness of breath    with exertion only    Patient Active Problem List   Diagnosis Date Noted  . Acute left otitis media 06/09/2016  . Hx of BKA, left - with subsequent cellulitis and treatment for osteomylitis 06/01/2016  . Abscess of left leg 04/26/2016  . Diabetes mellitus with complication ([place])   . Normocytic anemia 01/01/2016  . Osteomyelitis ([place]) 12/31/2015  . Diabetic ulcer of left heel associated with type 1 diabetes mellitus, with necrosis of bone ([place]) 12/31/2015  . Ankle wound 12/31/2015  . Chronic osteomyelitis of left ankle with draining sinus ([place])   . Type 1 diabetes, uncontrolled, with Charcot's joint of foot ([place]) 10/12/2015  . Low HDL (under 40) 09/22/2015  . Morbidly obese ([place]) 09/22/2015  . H/O noncompliance with medical treatment, presenting hazards to health 09/22/2015  . Hx MRSA infection 09/22/2015  . Vitamin D deficiency 09/22/2015  . Diabetes mellitus type 1 ([place])   . Charcot's joint of foot 11/25/2013  . Essential hypertension 05/03/2013  . Hyperlipidemia 07/26/2012  . Diabetic ketoacidosis ([place])   . GERD (gastroesophageal reflux disease)   . Meniscus tear   . Epigastric pain 12/10/2011  . Vomiting 12/10/2011  . Problems influencing health status 11/21/2011    Past Surgical History:  Procedure Laterality Date  . AMPUTATION Left 01/01/2016   Procedure: AMPUTATION BELOW KNEE;  Surgeon: [name], MD;  Location: [place];  Service: Orthopedics;  Laterality: Left;  . APPLICATION OF WOUND VAC  04/26/2016  . FRACTURE SURGERY    .  HERNIA REPAIR    . I&D EXTREMITY Left 04/26/2016   Procedure: IRRIGATION AND DEBRIDEMENT EXTREMITY/Left Leg/Possible Wound Vac;  Surgeon: [name], MD;  Location: [place];  Service: Orthopedics;  Laterality: Left;  . INCISE AND DRAIN ABCESS Left 04/26/2016  . KNEE ARTHROSCOPY Left ~ 2010  . LAPAROSCOPIC CHOLECYSTECTOMY  2015  . METACARPOPHALANGEAL JOINT ARTHRODESIS Left 06/2012   Fracture left index finger  intra-articular MCP joint/notes 06/30/2012  . OPEN REDUCTION INTERNAL FIXATION (ORIF) PROXIMAL PHALANX Left 06/30/2012   Procedure: OPEN REDUCTION INTERNAL FIXATION (ORIF) LEFT INDEX FINGER PROXIMAL PHALANX FRACTURE WITH LIGAMENT REPAIR AS NECESSARY;  Surgeon: [name], MD;  Location: [place];  Service: Orthopedics;  Laterality: Left;  . UMBILICAL HERNIA REPAIR  2015   "w/gallbladder OR"       Home Medications    Prior to Admission medications   Medication Sig Start Date End Date Taking? Authorizing Provider  acetic acid (VOSOL) 2 % otic solution Place 4 drops into the left ear 3 (three) times daily. 06/09/16   Danford, Valetta Fuller D, NP  atorvastatin (LIPITOR) 40 MG tablet TAKE 1 TABLET BY MOUTH DAILY AT 6 PM. 05/06/16   Opalski, Neoma Laming, DO  BAYER MICROLET LANCETS lancets Use as instructed to check 3 times daily. 10/12/15   Philemon Kingdom, MD  Blood Glucose Monitoring Suppl (ONE TOUCH ULTRA SYSTEM KIT) w/Device KIT 1 kit by Does not apply route once. Check blood sugar 3 times daily. Diagnosis Diabetes ICD-10 E11.8    [provider]  doxycycline (VIBRA-TABS) 100 MG tablet Take 1 tablet (100 mg total) by mouth 2 (two) times daily. 06/01/16   Campbell Riches, MD  glucagon (GLUCAGON EMERGENCY) 1 MG injection Inject 1 mg into the muscle once as needed. 10/12/15   Philemon Kingdom, MD  glucose blood (BAYER CONTOUR NEXT TEST) test strip Use as instructed to check 3 times daily. 10/12/15   Philemon Kingdom, MD  ibuprofen (ADVIL,MOTRIN) 800 MG tablet Take 1 tablet (800 mg total) by mouth 3 (three) times daily. 01/03/16   Lorella Nimrod, MD  insulin glargine (LANTUS) 100 UNIT/ML injection Inject 40 units at bedtime 12/28/15   Philemon Kingdom, MD  insulin lispro (HUMALOG) 100 UNIT/ML injection Inject 0.03-0.2 mLs (3-20 Units total) into the skin 3 (three) times daily before meals. Sliding scale per carb count 4 units for every 30 carbs 05/06/16   Philemon Kingdom, MD  Insulin Pen Needle (BD  PEN NEEDLE NANO U/F) 32G X 4 MM MISC 1 pen by Does not apply route 3 (three) times daily. 10/23/13   [provider]  traMADol (ULTRAM) 50 MG tablet Take 1 tablet (50 mg total) by mouth every 6 (six) hours as needed for moderate pain. 04/29/16   Corky Sing, PA-C    Family History Family History  Problem Relation Age of Onset  . Other Mother   . Cancer Mother        Breast / Bone  . Heart attack Father   . Hypertension Father   . Hyperlipidemia Father   . Diabetes Unknown   . Alcohol abuse Sister   . Diabetes Maternal Grandfather   . Stroke Paternal Grandmother   . Alcohol abuse Paternal Grandfather     Social History Social History  Substance Use Topics  . Smoking status: Never Smoker  . Smokeless tobacco: Former Systems developer    Types: Snuff, Chew    Quit date: 08/25/2010  . Alcohol use 8.4 oz/week    14 Cans of beer per week  Allergies   Influenza vac split quad; Hydrochlorothiazide; and Losartan   Review of Systems Review of Systems  All other systems reviewed and are negative.    Physical Exam Updated Vital Signs BP 126/85   Pulse (!) 109   Temp 97.7 F (36.5 C)   Resp 16   Ht 6' (1.829 m)   Wt 290 lb (131.5 kg)   SpO2 100%   BMI 39.33 kg/m   Physical Exam  CONSTITUTIONAL: Well developed/well nourished HEAD: Normocephalic/atraumatic EYES: EOMI/PERRL, no icterus ENMT: Mucous membranes moist NECK: supple no meningeal signs SPINE/BACK:entire spine nontender CV: S1/S2 noted, no murmurs/rubs/gallops noted LUNGS: Lungs are clear to auscultation bilaterally, no apparent distress ABDOMEN: soft, nontender, no rebound or guarding, bowel sounds noted throughout abdomen GU:no cva tenderness NEURO: Pt is awake/alert/appropriate, moves all extremitiesx4.  No facial droop.   EXTREMITIES: pulses normal/equal, full ROM, s/p L BKA, no tenderness or redness to the stump SKIN: warm, color normal PSYCH: no abnormalities of mood noted, alert and oriented  to situation  ED Treatments / Results   DIAGNOSTIC STUDIES: Oxygen Saturation is 100% on RA, normal by my interpretation.   COORDINATION OF CARE: 3:24 AM-Discussed next steps with pt. Pt verbalized understanding and is agreeable with the plan.    Labs (all labs ordered are listed, but only abnormal results are displayed) Labs Reviewed  COMPREHENSIVE METABOLIC PANEL - Abnormal; Notable for the following:       Result Value   Sodium 133 (*)    Chloride 100 (*)    Glucose, Bld 150 (*)    All other components within normal limits  CBC - Abnormal; Notable for the following:    Hemoglobin 12.6 (*)    HCT 37.2 (*)    All other components within normal limits  URINALYSIS, ROUTINE W REFLEX MICROSCOPIC - Abnormal; Notable for the following:    Color, Urine AMBER (*)    Ketones, ur 5 (*)    Protein, ur 30 (*)    Bacteria, UA RARE (*)    All other components within normal limits  LIPASE, BLOOD  TROPONIN I    EKG  EKG Interpretation  Date/Time:  Saturday June 25 2016 03:36:10 EDT Ventricular Rate:  109 PR Interval:    QRS Duration: 90 QT Interval:  332 QTC Calculation: 447 R Axis:   54 Text Interpretation:  Sinus tachycardia Low voltage, precordial leads ECG OTHERWISE WITHIN NORMAL LIMITS Confirmed by Ripley Fraise [phone]) on 06/25/2016 3:41:50 AM       Radiology No results found.  Procedures Procedures (including critical care time)  Medications Ordered in ED Medications  gi cocktail (Maalox,Lidocaine,Donnatal) (30 mLs Oral Given 06/25/16 0449)     Initial Impression / Assessment and Plan / ED Course  I have reviewed the triage vital signs and the nursing notes.  Pertinent labs  results that were available during my care of the patient were reviewed by me and considered in my medical decision making (see chart for details).     Pt improving Due to possibility of silent MI in this diabetic, EKG/troponin performed and unremarkable Vitals improved He is  taking PO He has no focal ABD tenderness Will d/c home We discussed strict ER return precautions   Final Clinical Impressions(s) / ED Diagnoses   Final diagnoses:  Non-intractable cyclical vomiting with nausea  Generalized abdominal pain    New Prescriptions New Prescriptions   No medications on file   I personally performed the services described in this documentation, which was  scribed in my presence. The recorded information has been reviewed and is accurate.       Ripley Fraise, MD 06/25/16 [phone]

## 2016-06-25 NOTE — Discharge Instructions (Signed)

## 2016-06-25 NOTE — Therapy (Signed)
[facility] [address] [place], [place], [address] Phone: [phone]   Fax:  [phone]  Physical Therapy Treatment  Patient Details  Name: Michael Hutchinson MRN: [id number] Date of Birth: 1975/06/20 Referring Provider: Dr. Toni Arthurs  Encounter Date: 06/23/2016     06/23/16 1539  PT Visits / Re-Eval  Visit Number 4  Number of Visits 21  Date for PT Re-Evaluation 08/19/16  Authorization  Authorization Type BCBS 30 visit limit with 23 visits remaining  Authorization - Visit Number 4  Authorization - Number of Visits 23  PT Time Calculation  PT Start Time 1533  PT Stop Time 1615  PT Time Calculation (min) 42 min  PT - End of Session  Equipment Utilized During Treatment Gait belt  Activity Tolerance Patient tolerated treatment well  Behavior During Therapy WFL for tasks assessed/performed    Past Medical History:  Diagnosis Date  . Charcot's joint of foot 11/25/2013  . Complication of anesthesia    "I wake up angry" (12/31/2015)  . Diabetes mellitus type 1 (HCC) dx'd 1981  . Diabetic ketoacidosis (HCC)   . Essential hypertension 05/03/2013  . GERD (gastroesophageal reflux disease)   . High cholesterol   . Hx MRSA infection    Inner thigh and under arm- healed areas  . Meniscus tear   . Shortness of breath    with exertion only    Past Surgical History:  Procedure Laterality Date  . AMPUTATION Left 01/01/2016   Procedure: AMPUTATION BELOW KNEE;  Surgeon: Toni Arthurs, MD;  Location: [facility] OR;  Service: Orthopedics;  Laterality: Left;  . APPLICATION OF WOUND VAC  04/26/2016  . FRACTURE SURGERY    . HERNIA REPAIR    . I&D EXTREMITY Left 04/26/2016   Procedure: IRRIGATION AND DEBRIDEMENT EXTREMITY/Left Leg/Possible Wound Vac;  Surgeon: Toni Arthurs, MD;  Location: [facility] OR;  Service: Orthopedics;  Laterality: Left;  . INCISE AND DRAIN ABCESS Left 04/26/2016  . KNEE ARTHROSCOPY Left ~ 2010  . LAPAROSCOPIC  CHOLECYSTECTOMY  2015  . METACARPOPHALANGEAL JOINT ARTHRODESIS Left 06/2012   Fracture left index finger intra-articular MCP joint/notes 06/30/2012  . OPEN REDUCTION INTERNAL FIXATION (ORIF) PROXIMAL PHALANX Left 06/30/2012   Procedure: OPEN REDUCTION INTERNAL FIXATION (ORIF) LEFT INDEX FINGER PROXIMAL PHALANX FRACTURE WITH LIGAMENT REPAIR AS NECESSARY;  Surgeon: Dominica Severin, MD;  Location: [facility] OR;  Service: Orthopedics;  Laterality: Left;  . UMBILICAL HERNIA REPAIR  2015   "w/gallbladder OR"    There were no vitals filed for this visit.   06/23/16 1534  Symptoms/Limitations  Subjective No new complaints. Pain is better today, has more socks on today. See's Chris tomorrow for [place] adjustments. Does feel he has a lot of sweat in the liner.   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 No/denies     06/23/16 1539  Transfers  Transfers Sit to Stand;Stand to Sit  Sit to Stand 5: Supervision;With upper extremity assist;From chair/3-in-1  Stand to Sit 5: Supervision;With upper extremity assist;To chair/3-in-1  Ambulation/Gait  Ambulation/Gait Yes  Ambulation/Gait Assistance 5: Supervision  Ambulation/Gait Assistance Details cues needed for equal step/stride length with gait; pt noted to have left  antalgic gait with fatigue. Also noted pt's right ankle rolls out in stance. Pt reports this is from an old injury and charcot foot. has insert however he does not feel it's helping. Plans to talk to [facility] about other options that are available.                              Ambulation Distance (Feet) 460 Feet (x1 w/cane, 300 x1 no AD)  Assistive  device Prosthesis;Straight cane;None  Gait Pattern Step-through pattern;Decreased stride length;Antalgic (rt ankle ever. noted in stance;antalgic with fatigue)  Ambulation Surface Level;Indoor  Stairs Yes  Stairs Assistance 4: Min guard  Stairs Assistance Details (indicate cue type and reason) cues on foot placement for reciprocal stepping down stairs  Stair Management Technique Two rails;Alternating pattern;Forwards  Number of Stairs 4  Ramp Other (comment) (min guard assist)  Ramp Details (indicate cue type and reason) blocked practice with cane with cues on on sequencing and step length  High Level Balance  High Level Balance Activities Negotiating over obstacles;Negotitating around obstacles  High Level Balance Comments 4 bolsters of vaired heights with cane:  x 4 laps forward with cues on sequencing and technique; figure 8 around hoola hoops on floor with cane: cues on pelvic positioning and step length to achieve correct pelvic positioning. performed x 3 laps with min guard assistance.    Prosthetics  Prosthetic Care Comments  educated on use of baby oil and anti-persperant for sweat management/to decrease heat rash.   Current prosthetic wear tolerance (days/week)  daily  Current prosthetic wear tolerance (#hours/day)  all awake hours with breaks to dry through out day  Residual limb condition  lateral wound with scab on incision with no signs of infection or drainage.   Education Provided Proper wear schedule/adjustment;Proper weight-bearing schedule/adjustment;Residual limb care;Correct ply sock adjustment  Person(s) Educated Patient  Education Method Explanation;Demonstration;Verbal cues  Education Method Verbalized understanding;Verbal cues required;Needs further instruction  Donning Prosthesis 5  Doffing Prosthesis 5          PT Short Term Goals - 06/14/16 1928      PT SHORT TERM GOAL #1   Title Patient wil verbalize understanding and adhere to proper prosthesis wear  schedule and prosthesis care. (TARGET DATE: 07/15/2016)   Baseline PT reset target date with delayed treatment with wound on limb / hospitalization   Time 5   Period Weeks   Status On-going     PT SHORT TERM GOAL #2   Title Patient will verbalize understanding and demonstrate HEP activities and exercises. (TARGET DATE: 07/15/2016)   Baseline PT reset target date with delayed treatment with wound on limb / hospitalization   Time 5   Period Weeks   Status On-going     PT SHORT TERM GOAL #3   Title Patient will score >32/56 on Berg Balance to indicate a decrease in his risk of falling. (TARGET DATE: 07/15/2016)   Baseline PT reset target date with delayed treatment with wound on limb / hospitalization   Time 5   Period Weeks   Status On-going     PT SHORT TERM GOAL #4   Title Patient will demonstrate ability to ambulate on level, indoor surfaces for 250' with prosthesis and LRAD with supervision. (TARGET DATE: 07/15/2016)   Baseline PT reset target date with delayed treatment with wound on limb / hospitalization   Time 5   Period Weeks   Status On-going  PT Long Term Goals - 06/14/16 1945      PT LONG TERM GOAL #1   Title Patient will verbalize and demonstrate understanding of his ongoing HEP program and fitness. (TARGET DATE: 08/19/2016)   Baseline PT reset target date with delayed treatment with wound on limb / hospitalization   Time 10   Period Weeks   Status On-going     PT LONG TERM GOAL #2   Title Patient will score >44/56 on the Berg Balance test to indicate a decrease in his risk of falling. (TARGET DATE: 08/19/2016)   Baseline PT reset target date with delayed treatment with wound on limb / hospitalization   Time 10   Period Weeks   Status On-going     PT LONG TERM GOAL #3   Title Patient will perform a TUG with prosthesis only in <13s to indicate a decrease in his risk of falling. (TARGET DATE: 08/19/2016)   Baseline PT reset target date with delayed treatment  with wound on limb / hospitalization   Time 10   Period Weeks   Status On-going     PT LONG TERM GOAL #4   Title Patient will complete an Functional Gait Assessment with a score of >18 to indicate a decrease in his risk of falling. (TARGET DATE: 08/19/2016)   Baseline PT reset target date with delayed treatment with wound on limb / hospitalization   Time 10   Period Weeks   Status On-going     PT LONG TERM GOAL #5   Title Patient will ambulate 1000' on unlevel surfaces (outdoors, ramps, curbs, and grass) with LRAD and prosthesis. (TARGET DATE: 08/19/2016)   Baseline PT reset target date with delayed treatment with wound on limb / hospitalization   Time 10   Period Weeks   Status On-going     PT LONG TERM GOAL #6   Title Patient performs functional task of lifting, pushing, pulling with prosthesis safely.  (Target Date: 08/19/2016)   Time 10   Period Weeks   Status On-going        06/23/16 1539  Plan  Clinical Impression Statement Today's skilled session continued to focus on gait and barriers with prosthesis, cane and no AD. Pt is progressing well toward goals and should benefit from continued PT to progress toward unmet goals.   Pt will benefit from skilled therapeutic intervention in order to improve on the following deficits Abnormal gait;Decreased activity tolerance;Decreased balance;Decreased knowledge of precautions;Decreased knowledge of use of DME;Decreased mobility;Difficulty walking;Decreased strength;Decreased skin integrity;Postural dysfunction;Pain  Rehab Potential Good  Clinical Impairments Affecting Rehab Potential DM type 1, charcot foot, HTN, HLD  PT Frequency 2x / week  PT Duration Other (comment) (10 weeks)  PT Treatment/Interventions ADLs/Self Care Home Management;DME Instruction;Gait training;Stair training;Functional mobility training;Therapeutic activities;Therapeutic exercise;Balance training;Prosthetic Training;Patient/family education;Neuromuscular  re-education;Scar mobilization  PT Next Visit Plan begin balance and continued with prosthetic training, outside on compliant surfaces weather permitting      Consulted and Agree with Plan of Care Patient;Family member/caregiver  Family Member Consulted Wife - Marchelle Folks      Patient will benefit from skilled therapeutic intervention in order to improve the following deficits and impairments:  Abnormal gait, Decreased activity tolerance, Decreased balance, Decreased knowledge of precautions, Decreased knowledge of use of DME, Decreased mobility, Difficulty walking, Decreased strength, Decreased skin integrity, Postural dysfunction, Pain  Visit Diagnosis: Other abnormalities of gait and mobility  Unsteadiness on feet  Muscle weakness (generalized)     Problem List Patient Active Problem  List   Diagnosis Date Noted  . Acute left otitis media 06/09/2016  . Hx of BKA, left - with subsequent cellulitis and treatment for osteomylitis 06/01/2016  . Abscess of left leg 04/26/2016  . Diabetes mellitus with complication (HCC)   . Normocytic anemia 01/01/2016  . Osteomyelitis (HCC) 12/31/2015  . 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 Ankle wound 12/31/2015  . Chronic osteomyelitis of left ankle with draining sinus (HCC)   . Type 1 diabetes, uncontrolled, with Charcot's joint of foot (HCC) 10/12/2015  . Low HDL (under 40) 09/22/2015  . Morbidly obese (HCC) 09/22/2015  . H/O noncompliance with medical treatment, presenting hazards to health 09/22/2015  . Hx MRSA infection 09/22/2015  . Vitamin D deficiency 09/22/2015  . Diabetes mellitus type 1 (HCC)   . Charcot's joint of foot 11/25/2013  . Essential hypertension 05/03/2013  . Hyperlipidemia 07/26/2012  . Diabetic ketoacidosis (HCC)   . GERD (gastroesophageal reflux disease)   . Meniscus tear   . Epigastric pain 12/10/2011  . Vomiting 12/10/2011  . Problems  influencing health status 11/21/2011    Sallyanne Kuster, PTA, [facility] [address], [address] [place], [place] [address] [phone] 06/25/16, 4:35 PM   Name: Michael Hutchinson MRN: [id number] Date of Birth: 09-23-1975

## 2016-06-27 ENCOUNTER — Ambulatory Visit: Payer: BLUE CROSS/BLUE SHIELD | Admitting: Physical Therapy

## 2016-06-27 ENCOUNTER — Encounter: Payer: Self-pay | Admitting: Physical Therapy

## 2016-06-27 DIAGNOSIS — R2681 Unsteadiness on feet: Secondary | ICD-10-CM

## 2016-06-27 DIAGNOSIS — R2689 Other abnormalities of gait and mobility: Secondary | ICD-10-CM | POA: Diagnosis not present

## 2016-06-27 DIAGNOSIS — M6281 Muscle weakness (generalized): Secondary | ICD-10-CM | POA: Diagnosis not present

## 2016-06-27 NOTE — Therapy (Signed)
[facility] [address] [place], [place], [address] Phone: [phone]   Fax:  [phone]  Physical Therapy Treatment  Patient Details  Name: Michael Hutchinson MRN: [id number] Date of Birth: 03-Aug-1975 Referring Provider: Dr. Toni Arthurs  Encounter Date: 06/27/2016      PT End of Session - 06/27/16 2329    Visit Number 5   Number of Visits 21   Date for PT Re-Evaluation 08/19/16   Authorization Type BCBS 30 visit limit with 23 visits remaining   Authorization - Visit Number 5   Authorization - Number of Visits 23   PT Start Time 1445   PT Stop Time 1530   PT Time Calculation (min) 45 min   Equipment Utilized During Treatment Gait belt   Activity Tolerance Patient tolerated treatment well   Behavior During Therapy WFL for tasks assessed/performed      Past Medical History:  Diagnosis Date  . Charcot's joint of foot 11/25/2013  . Complication of anesthesia    "I wake up angry" (12/31/2015)  . Diabetes mellitus type 1 (HCC) dx'd 1981  . Diabetic ketoacidosis (HCC)   . Essential hypertension 05/03/2013  . GERD (gastroesophageal reflux disease)   . High cholesterol   . Hx MRSA infection    Inner thigh and under arm- healed areas  . Meniscus tear   . Shortness of breath    with exertion only    Past Surgical History:  Procedure Laterality Date  . AMPUTATION Left 01/01/2016   Procedure: AMPUTATION BELOW KNEE;  Surgeon: Toni Arthurs, MD;  Location: [facility] OR;  Service: Orthopedics;  Laterality: Left;  . APPLICATION OF WOUND VAC  04/26/2016  . FRACTURE SURGERY    . HERNIA REPAIR    . I&D EXTREMITY Left 04/26/2016   Procedure: IRRIGATION AND DEBRIDEMENT EXTREMITY/Left Leg/Possible Wound Vac;  Surgeon: Toni Arthurs, MD;  Location: [facility] OR;  Service: Orthopedics;  Laterality: Left;  . INCISE AND DRAIN ABCESS Left 04/26/2016  . KNEE ARTHROSCOPY Left ~ 2010  . LAPAROSCOPIC CHOLECYSTECTOMY  2015  . METACARPOPHALANGEAL  JOINT ARTHRODESIS Left 06/2012   Fracture left index finger intra-articular MCP joint/notes 06/30/2012  . OPEN REDUCTION INTERNAL FIXATION (ORIF) PROXIMAL PHALANX Left 06/30/2012   Procedure: OPEN REDUCTION INTERNAL FIXATION (ORIF) LEFT INDEX FINGER PROXIMAL PHALANX FRACTURE WITH LIGAMENT REPAIR AS NECESSARY;  Surgeon: Dominica Severin, MD;  Location: [facility] OR;  Service: Orthopedics;  Laterality: Left;  . UMBILICAL HERNIA REPAIR  2015   "w/gallbladder OR"    There were no vitals filed for this visit.      Subjective Assessment - 06/27/16 1455    Subjective Wearing prosthesis all awake hours with itching at top of liner.    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 No/denies                         [facility] Adult PT Treatment/Exercise - 06/27/16 1445      Transfers   Transfers Sit to Stand;Stand to Sit   Sit to Stand 5: Supervision;With upper extremity assist;From chair/3-in-1   Stand to Sit 5: Supervision;With upper extremity assist;To chair/3-in-1  Ambulation/Gait   Ambulation/Gait Yes   Ambulation/Gait Assistance 5: Supervision   Ambulation/Gait Assistance Details Verbal cues on step width & weight shift over prosthesis in stance.   Ambulation Distance (Feet) 500 Feet   Assistive device Prosthesis;Straight cane;None   Gait Pattern Step-through pattern;Decreased stride length;Antalgic  rt ankle ever. noted in stance;antalgic with fatigue   Ambulation Surface Level;Indoor   Stairs Yes   Stairs Assistance 4: Min guard   Stairs Assistance Details (indicate cue type and reason) verbal & demo cues on reciprocal pattern with prosthesis   Stair  Management Technique Two rails;One rail Left;With cane;Alternating pattern;Forwards   Number of Stairs 4  3 reps with 2 rails & 3 reps with 1 rail/cane   Ramp --     High Level Balance   High Level Balance Activities Negotiating over obstacles;Negotitating around obstacles;Figure 8 turns   High Level Balance Comments demo & verbal cues on technique with prosthesis.     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 Pt return demo lifting 1#, 15# box & 25# box with verbal cues with balance loss upon arising.    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 Pt return demo understanding. PT demo & instructed in climbing A-frame ladder and performing 2 hand task standing on ladder. Pt return demo with minA.     Exercises   Exercises Knee/Hip     Knee/Hip Exercises: Standing   Forward Step Up Right;Left;10 reps;Hand Hold: 2;Step Height: 8"  step height: 10"  BUE support on bars      Prosthetics   Prosthetic Care Comments  Instruction on use of Certain Dri 3 nights / week to decrease excessive sweating.    Current prosthetic wear tolerance (days/week)  daily   Current prosthetic wear tolerance (#hours/day)  all awake hours with breaks to dry through out day   Residual limb condition  no open areas   Education Provided Proper wear schedule/adjustment;Proper weight-bearing schedule/adjustment;Residual limb care;Correct ply sock adjustment   Person(s) Educated Patient;Spouse   Education Method Explanation;Demonstration;Tactile cues;Verbal cues   Education Method Verbalized understanding;Returned demonstration;Tactile cues required;Verbal cues required;Needs further instruction                  PT Short Term Goals - 06/14/16 1928      PT SHORT TERM GOAL #1   Title Patient wil verbalize understanding and adhere to proper prosthesis wear schedule and  prosthesis care. (TARGET DATE: 07/15/2016)   Baseline PT reset target date with delayed treatment with wound on limb / hospitalization   Time 5   Period Weeks   Status On-going     PT SHORT TERM GOAL #2   Title Patient will verbalize understanding and demonstrate HEP activities and exercises. (TARGET DATE: 07/15/2016)   Baseline PT reset target date with delayed treatment with wound on limb / hospitalization   Time 5   Period Weeks   Status On-going     PT SHORT TERM GOAL #3   Title Patient will score >32/56 on Berg Balance to indicate a decrease in his risk of falling. (TARGET DATE: 07/15/2016)   Baseline PT reset target date with delayed treatment with wound on limb / hospitalization   Time 5   Period Weeks   Status On-going     PT SHORT TERM GOAL #4   Title Patient will demonstrate ability to  ambulate on level, indoor surfaces for 250' with prosthesis and LRAD with supervision. (TARGET DATE: 07/15/2016)   Baseline PT reset target date with delayed treatment with wound on limb / hospitalization   Time 5   Period Weeks   Status On-going           PT Long Term Goals - 06/14/16 1945      PT LONG TERM GOAL #1   Title Patient will verbalize and demonstrate understanding of his ongoing HEP program and fitness. (TARGET DATE: 08/19/2016)   Baseline PT reset target date with delayed treatment with wound on limb / hospitalization   Time 10   Period Weeks   Status On-going     PT LONG TERM GOAL #2   Title Patient will score >44/56 on the Berg Balance test to indicate a decrease in his risk of falling. (TARGET DATE: 08/19/2016)   Baseline PT reset target date with delayed treatment with wound on limb / hospitalization   Time 10   Period Weeks   Status On-going     PT LONG TERM GOAL #3   Title Patient will perform a TUG with prosthesis only in <13s to indicate a decrease in his risk of falling. (TARGET DATE: 08/19/2016)   Baseline PT reset target date with delayed treatment with wound on  limb / hospitalization   Time 10   Period Weeks   Status On-going     PT LONG TERM GOAL #4   Title Patient will complete an Functional Gait Assessment with a score of >18 to indicate a decrease in his risk of falling. (TARGET DATE: 08/19/2016)   Baseline PT reset target date with delayed treatment with wound on limb / hospitalization   Time 10   Period Weeks   Status On-going     PT LONG TERM GOAL #5   Title Patient will ambulate 1000' on unlevel surfaces (outdoors, ramps, curbs, and grass) with LRAD and prosthesis. (TARGET DATE: 08/19/2016)   Baseline PT reset target date with delayed treatment with wound on limb / hospitalization   Time 10   Period Weeks   Status On-going     PT LONG TERM GOAL #6   Title Patient performs functional task of lifting, pushing, pulling with prosthesis safely.  (Target Date: 08/19/2016)   Time 10   Period Weeks   Status On-going               Plan - 06/27/16 2330    Clinical Impression Statement Patient improved prosthetic skills of stepping over obstacles, stepping up on 10" with UE support, negotiating around obstacle, picking up objects and climbing A-frame ladder.    Clinical Presentation Evolving   Clinical Decision Making Moderate   Rehab Potential Good   Clinical Impairments Affecting Rehab Potential DM type 1, charcot foot, HTN, HLD   PT Frequency 2x / week   PT Duration Other (comment)  10 weeks   PT Treatment/Interventions ADLs/Self Care Home Management;DME Instruction;Gait training;Stair training;Functional mobility training;Therapeutic activities;Therapeutic exercise;Balance training;Prosthetic Training;Patient/family education;Neuromuscular re-education;Scar mobilization   PT Next Visit Plan balance and continued with prosthetic training, outside on compliant surfaces weather permitting       Consulted and Agree with Plan of Care Patient;Family member/caregiver   Family Member Consulted Wife - Marchelle Folks       Patient will benefit  from skilled therapeutic intervention in order to improve the following deficits and impairments:  Abnormal gait, Decreased activity tolerance, Decreased balance, Decreased knowledge of precautions, Decreased knowledge of use  of DME, Decreased mobility, Difficulty walking, Decreased strength, Decreased skin integrity, Postural dysfunction, Pain  Visit Diagnosis: Other abnormalities of gait and mobility  Unsteadiness on feet  Muscle weakness (generalized)     Problem List Patient Active Problem List   Diagnosis Date Noted  . Acute left otitis media 06/09/2016  . Hx of BKA, left - with subsequent cellulitis and treatment for osteomylitis 06/01/2016  . Abscess of left leg 04/26/2016  . Diabetes mellitus with complication (HCC)   . Normocytic anemia 01/01/2016  . Osteomyelitis (HCC) 12/31/2015  . 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 Ankle wound 12/31/2015  . Chronic osteomyelitis of left ankle with draining sinus (HCC)   . Type 1 diabetes, uncontrolled, with Charcot's joint of foot (HCC) 10/12/2015  . Low HDL (under 40) 09/22/2015  . Morbidly obese (HCC) 09/22/2015  . H/O noncompliance with medical treatment, presenting hazards to health 09/22/2015  . Hx MRSA infection 09/22/2015  . Vitamin D deficiency 09/22/2015  . Diabetes mellitus type 1 (HCC)   . Charcot's joint of foot 11/25/2013  . Essential hypertension 05/03/2013  . Hyperlipidemia 07/26/2012  . Diabetic ketoacidosis (HCC)   . GERD (gastroesophageal reflux disease)   . Meniscus tear   . Epigastric pain 12/10/2011  . Vomiting 12/10/2011  . Problems influencing health status 11/21/2011    Vladimir Faster PT, DPT 06/27/2016, 11:36 PM  [facility] [address] [place], [place], [address] Phone: [phone]   Fax:  [phone]  Name: Michael Hutchinson MRN: [id number] Date of Birth:  [birth year]

## 2016-06-28 ENCOUNTER — Ambulatory Visit: Payer: BLUE CROSS/BLUE SHIELD | Admitting: Physical Therapy

## 2016-06-28 ENCOUNTER — Telehealth: Payer: Self-pay | Admitting: Internal Medicine

## 2016-06-28 ENCOUNTER — Encounter: Payer: Self-pay | Admitting: Physical Therapy

## 2016-06-28 DIAGNOSIS — M6281 Muscle weakness (generalized): Secondary | ICD-10-CM

## 2016-06-28 DIAGNOSIS — R2689 Other abnormalities of gait and mobility: Secondary | ICD-10-CM | POA: Diagnosis not present

## 2016-06-28 DIAGNOSIS — R2681 Unsteadiness on feet: Secondary | ICD-10-CM | POA: Diagnosis not present

## 2016-06-28 NOTE — Telephone Encounter (Signed)
Patient called to reschedule follow up appointment that he missed. I scheduled the appointment with Dr. Elvera Lennox in September due to that being the soonest available.   Patient would like a call from [place] to address this situation and to be transferred to another provider at the same office.

## 2016-06-29 NOTE — Telephone Encounter (Signed)
Called the pt apologized to him and to his wife. I discussed the process to get switched over to a different MD. He understood and appreciated the call, I also let him now of the vacations that would cause a delay in a return call. Pt again was understanding.

## 2016-06-29 NOTE — Therapy (Signed)
[facility] [address] [place], [place], [address] Phone: [phone]   Fax:  [phone]  Physical Therapy Treatment  Patient Details  Name: Michael Hutchinson MRN: [id number] Date of Birth: 08-Sep-1975 Referring Provider: Dr. Toni Arthurs  Encounter Date: 06/28/2016      PT End of Session - 06/28/16 1619    Visit Number 6   Number of Visits 21   Date for PT Re-Evaluation 08/19/16   Authorization Type [employer] 30 visit limit with 23 visits remaining   Authorization - Visit Number 6   Authorization - Number of Visits 23   PT Start Time 1530   PT Stop Time 1616   PT Time Calculation (min) 46 min   Equipment Utilized During Treatment Gait belt   Activity Tolerance Patient tolerated treatment well   Behavior During Therapy WFL for tasks assessed/performed      Past Medical History:  Diagnosis Date  . Charcot's joint of foot 11/25/2013  . Complication of anesthesia    "I wake up angry" (12/31/2015)  . Diabetes mellitus type 1 (HCC) dx'd 1981  . Diabetic ketoacidosis (HCC)   . Essential hypertension 05/03/2013  . GERD (gastroesophageal reflux disease)   . High cholesterol   . Hx MRSA infection    Inner thigh and under arm- healed areas  . Meniscus tear   . Shortness of breath    with exertion only    Past Surgical History:  Procedure Laterality Date  . AMPUTATION Left 01/01/2016   Procedure: AMPUTATION BELOW KNEE;  Surgeon: Toni Arthurs, MD;  Location: [facility] OR;  Service: Orthopedics;  Laterality: Left;  . APPLICATION OF WOUND VAC  04/26/2016  . FRACTURE SURGERY    . HERNIA REPAIR    . I&D EXTREMITY Left 04/26/2016   Procedure: IRRIGATION AND DEBRIDEMENT EXTREMITY/Left Leg/Possible Wound Vac;  Surgeon: Toni Arthurs, MD;  Location: [facility] OR;  Service: Orthopedics;  Laterality: Left;  . INCISE AND DRAIN ABCESS Left 04/26/2016  . KNEE ARTHROSCOPY Left ~ 2010  . LAPAROSCOPIC CHOLECYSTECTOMY  2015  . METACARPOPHALANGEAL  JOINT ARTHRODESIS Left 06/2012   Fracture left index finger intra-articular MCP joint/notes 06/30/2012  . OPEN REDUCTION INTERNAL FIXATION (ORIF) PROXIMAL PHALANX Left 06/30/2012   Procedure: OPEN REDUCTION INTERNAL FIXATION (ORIF) LEFT INDEX FINGER PROXIMAL PHALANX FRACTURE WITH LIGAMENT REPAIR AS NECESSARY;  Surgeon: Dominica Severin, MD;  Location: [facility] OR;  Service: Orthopedics;  Laterality: Left;  . UMBILICAL HERNIA REPAIR  2015   "w/gallbladder OR"    There were no vitals filed for this visit.      Subjective Assessment - 06/28/16 1532    Subjective No pain from new activities yesterday.    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 No/denies                         [facility] Adult PT Treatment/Exercise - 06/28/16 1530      Transfers   Transfers Sit to Stand;Stand to Sit;Floor to Transfer   Sit to Stand 6: Modified independent (Device/Increase time);With upper extremity assist;From chair/3-in-1   Stand to Sit 6: Modified independent (Device/Increase time);With upper extremity  assist;To chair/3-in-1   Floor to Transfer 5: Supervision;With upper extremity assist  pushing on mat table   Floor to Transfer Details (indicate cue type and reason) PT demo technique leading with either LE. Pt return demo with verbal cues.      Ambulation/Gait   Ambulation/Gait Yes   Ambulation/Gait Assistance 5: Supervision   Ambulation/Gait Assistance Details verbal & tactile cues to maintain path & pace with turning head to scan (right /left, up/down, diagonals)   Ambulation Distance (Feet) 500 Feet   Assistive device Prosthesis;Straight cane;None   Gait Pattern  Step-through pattern;Decreased stride length;Antalgic  rt ankle ever. noted in stance;antalgic with fatigue   Ambulation Surface Indoor;Level   Stairs Yes   Stairs Assistance 5: Supervision   Stairs Assistance Details (indicate cue type and reason) verbal cues on alternating pattern   Stair Management Technique Two rails;One rail Left;With cane;Alternating pattern;Forwards   Number of Stairs 4  3 reps with 2 rails & 3 reps with 1 rail/cane   Ramp 4: Min assist;5: Supervision  cane with SBA progressed to prosthesis only minA   Ramp Details (indicate cue type and reason) verbal, demo & tactile cues on posture & wt shift   Curb 5: Supervision;4: Min assist  cane with SBA progressed to prosthesis only minA   Curb Details (indicate cue type and reason) demo, verbal & tactile cues on step length / foot position, posture & step thru pattern for balance     High Level Balance   High Level Balance Activities Negotiating over obstacles;Negotitating around obstacles;Figure 8 turns;Head turns   High Level Balance Comments verbal cues on technique with prosthesis, maintain path & pace with head turns in hall for visual reference.     Therapeutic Activites    Therapeutic Activities Lifting;Work Counselling psychologist;Other Therapeutic Activities   Lifting PT verbal cues on technique to pick up objects from floor. Pt return demo lifting 1#, 15# box & 25# box with verbal cues with balance loss upon arising.    Work Counselling psychologist PT verbal cues on weight shift for pushing & pulling. Pt return demo understanding. PT demo & instructed in climbing A-frame ladder and performing 2 hand task standing on ladder. Pt return demo with minA.   Other Therapeutic Activities simulated commerical style riding mower: PT demo, verbal cues on geting on/off mower. Pt return demo understanding with verbal cues.      Exercises   Exercises Knee/Hip     Knee/Hip Exercises: Standing   Forward Step Up Right;Left;10 reps;Hand Hold: 2;Step  Height: 8"  step height: 10"  BUE support on bars, 2 steps together      Prosthetics   Prosthetic Care Comments  Instruction on use of Certain Dri 3 nights / week to decrease excessive sweating.    Current prosthetic wear tolerance (days/week)  daily   Current prosthetic wear tolerance (#hours/day)  all awake hours with breaks to dry through out day   Residual limb condition  no open areas   Education Provided Proper wear schedule/adjustment;Proper weight-bearing schedule/adjustment;Residual limb care;Correct ply sock adjustment                  PT Short Term Goals - 06/14/16 1928      PT SHORT TERM GOAL #1   Title Patient wil verbalize understanding and adhere to proper prosthesis wear schedule and prosthesis care. (TARGET DATE: 07/15/2016)   Baseline PT reset target date with delayed treatment with wound on limb / hospitalization   Time 5   Period  Weeks   Status On-going     PT SHORT TERM GOAL #2   Title Patient will verbalize understanding and demonstrate HEP activities and exercises. (TARGET DATE: 07/15/2016)   Baseline PT reset target date with delayed treatment with wound on limb / hospitalization   Time 5   Period Weeks   Status On-going     PT SHORT TERM GOAL #3   Title Patient will score >32/56 on Berg Balance to indicate a decrease in his risk of falling. (TARGET DATE: 07/15/2016)   Baseline PT reset target date with delayed treatment with wound on limb / hospitalization   Time 5   Period Weeks   Status On-going     PT SHORT TERM GOAL #4   Title Patient will demonstrate ability to ambulate on level, indoor surfaces for 250' with prosthesis and LRAD with supervision. (TARGET DATE: 07/15/2016)   Baseline PT reset target date with delayed treatment with wound on limb / hospitalization   Time 5   Period Weeks   Status On-going           PT Long Term Goals - 06/14/16 1945      PT LONG TERM GOAL #1   Title Patient will verbalize and demonstrate understanding of  his ongoing HEP program and fitness. (TARGET DATE: 08/19/2016)   Baseline PT reset target date with delayed treatment with wound on limb / hospitalization   Time 10   Period Weeks   Status On-going     PT LONG TERM GOAL #2   Title Patient will score >44/56 on the Berg Balance test to indicate a decrease in his risk of falling. (TARGET DATE: 08/19/2016)   Baseline PT reset target date with delayed treatment with wound on limb / hospitalization   Time 10   Period Weeks   Status On-going     PT LONG TERM GOAL #3   Title Patient will perform a TUG with prosthesis only in <13s to indicate a decrease in his risk of falling. (TARGET DATE: 08/19/2016)   Baseline PT reset target date with delayed treatment with wound on limb / hospitalization   Time 10   Period Weeks   Status On-going     PT LONG TERM GOAL #4   Title Patient will complete an Functional Gait Assessment with a score of >18 to indicate a decrease in his risk of falling. (TARGET DATE: 08/19/2016)   Baseline PT reset target date with delayed treatment with wound on limb / hospitalization   Time 10   Period Weeks   Status On-going     PT LONG TERM GOAL #5   Title Patient will ambulate 1000' on unlevel surfaces (outdoors, ramps, curbs, and grass) with LRAD and prosthesis. (TARGET DATE: 08/19/2016)   Baseline PT reset target date with delayed treatment with wound on limb / hospitalization   Time 10   Period Weeks   Status On-going     PT LONG TERM GOAL #6   Title Patient performs functional task of lifting, pushing, pulling with prosthesis safely.  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 Pt appears to understand floor transfers pushing up on horizontal surface.    Rehab Potential Good   Clinical Impairments  Affecting  Rehab Potential DM type 1, charcot foot, HTN, HLD   PT Frequency 2x / week   PT Duration Other (comment)  10 weeks   PT Treatment/Interventions ADLs/Self Care Home Management;DME Instruction;Gait training;Stair training;Functional mobility training;Therapeutic activities;Therapeutic exercise;Balance training;Prosthetic Training;Patient/family education;Neuromuscular re-education;Scar mobilization   PT Next Visit Plan balance and continued with prosthetic training, outside on compliant surfaces weather permitting       Consulted and Agree with Plan of Care Patient;Family member/caregiver   Family Member Consulted Wife - Marchelle Folks       Patient will benefit from skilled therapeutic intervention in order to improve the following deficits and impairments:  Abnormal gait, Decreased activity tolerance, Decreased balance, Decreased knowledge of precautions, Decreased knowledge of use of DME, Decreased mobility, Difficulty walking, Decreased strength, Decreased skin integrity, Postural dysfunction, Pain  Visit Diagnosis: Other abnormalities of gait and mobility  Unsteadiness on feet  Muscle weakness (generalized)     Problem List Patient Active Problem List   Diagnosis Date Noted  . Acute left otitis media 06/09/2016  . Hx of BKA, left - with subsequent cellulitis and treatment for osteomylitis 06/01/2016  . Abscess of left leg 04/26/2016  . Diabetes mellitus with complication (HCC)   . Normocytic anemia 01/01/2016  . Osteomyelitis (HCC) 12/31/2015  . 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 Ankle wound 12/31/2015  . Chronic osteomyelitis of left ankle with draining sinus (HCC)   . Type 1 diabetes, uncontrolled, with Charcot's joint of foot (HCC) 10/12/2015  . Low HDL (under 40) 09/22/2015  . Morbidly obese (HCC) 09/22/2015  . H/O noncompliance with medical treatment, presenting hazards to health 09/22/2015  . Hx MRSA  infection 09/22/2015  . Vitamin D deficiency 09/22/2015  . Diabetes mellitus type 1 (HCC)   . Charcot's joint of foot 11/25/2013  . Essential hypertension 05/03/2013  . Hyperlipidemia 07/26/2012  . Diabetic ketoacidosis (HCC)   . GERD (gastroesophageal reflux disease)   . Meniscus tear   . Epigastric pain 12/10/2011  . Vomiting 12/10/2011  . Problems influencing health status 11/21/2011    Vladimir Faster PT, DPT 06/29/2016, 4:24 PM  [facility] [address] [place], [place], [address] Phone: [phone]   Fax:  [phone]  Name: FLORENT BERHOW MRN: [id number] Date of Birth: [birth year]

## 2016-07-04 ENCOUNTER — Encounter: Payer: Self-pay | Admitting: Infectious Diseases

## 2016-07-04 ENCOUNTER — Ambulatory Visit (INDEPENDENT_AMBULATORY_CARE_PROVIDER_SITE_OTHER): Payer: BLUE CROSS/BLUE SHIELD | Admitting: Infectious Diseases

## 2016-07-04 ENCOUNTER — Encounter: Payer: Self-pay | Admitting: Physical Therapy

## 2016-07-04 ENCOUNTER — Encounter: Payer: Self-pay | Admitting: Family Medicine

## 2016-07-04 ENCOUNTER — Ambulatory Visit: Payer: BLUE CROSS/BLUE SHIELD | Admitting: Physical Therapy

## 2016-07-04 ENCOUNTER — Ambulatory Visit (INDEPENDENT_AMBULATORY_CARE_PROVIDER_SITE_OTHER): Payer: BLUE CROSS/BLUE SHIELD | Admitting: Family Medicine

## 2016-07-04 VITALS — BP 115/81 | HR 104 | Ht 70.0 in | Wt 317.0 lb

## 2016-07-04 DIAGNOSIS — E1065 Type 1 diabetes mellitus with hyperglycemia: Secondary | ICD-10-CM

## 2016-07-04 DIAGNOSIS — E1029 Type 1 diabetes mellitus with other diabetic kidney complication: Secondary | ICD-10-CM | POA: Diagnosis not present

## 2016-07-04 DIAGNOSIS — M6281 Muscle weakness (generalized): Secondary | ICD-10-CM

## 2016-07-04 DIAGNOSIS — R2681 Unsteadiness on feet: Secondary | ICD-10-CM

## 2016-07-04 DIAGNOSIS — R6889 Other general symptoms and signs: Secondary | ICD-10-CM | POA: Insufficient documentation

## 2016-07-04 DIAGNOSIS — I10 Essential (primary) hypertension: Secondary | ICD-10-CM | POA: Diagnosis not present

## 2016-07-04 DIAGNOSIS — Z89512 Acquired absence of left leg below knee: Secondary | ICD-10-CM

## 2016-07-04 DIAGNOSIS — E786 Lipoprotein deficiency: Secondary | ICD-10-CM | POA: Diagnosis not present

## 2016-07-04 DIAGNOSIS — E10610 Type 1 diabetes mellitus with diabetic neuropathic arthropathy: Secondary | ICD-10-CM | POA: Diagnosis not present

## 2016-07-04 DIAGNOSIS — R2689 Other abnormalities of gait and mobility: Secondary | ICD-10-CM | POA: Diagnosis not present

## 2016-07-04 DIAGNOSIS — R809 Proteinuria, unspecified: Secondary | ICD-10-CM

## 2016-07-04 DIAGNOSIS — IMO0002 Reserved for concepts with insufficient information to code with codable children: Secondary | ICD-10-CM

## 2016-07-04 DIAGNOSIS — R5382 Chronic fatigue, unspecified: Secondary | ICD-10-CM | POA: Insufficient documentation

## 2016-07-04 MED ORDER — LOSARTAN POTASSIUM 25 MG PO TABS: 25.0000 mg | ORAL_TABLET | Freq: Two times a day (BID) | ORAL | 1 refills | Status: DC

## 2016-07-04 NOTE — Assessment & Plan Note (Signed)
His high Glc is concerning, (his daughter notes that he has had 500 before).  Will f/u with PCP.

## 2016-07-04 NOTE — Patient Instructions (Addendum)
Risk factors diabetes  Researchers don't fully understand why some people develop prediabetes and type 2 diabetes and others don't.  It's clear that certain factors increase the risk, however, including:  Weight. The more fatty tissue you have, the more resistant your cells become to insulin.  Inactivity. The less active you are, the greater your risk. Physical activity helps you control your weight, uses up glucose as energy and makes your cells more sensitive to insulin.  Family history. Your risk increases if a parent or sibling has type 2 diabetes.  Race. Although it's unclear why, people of certain races - including blacks, Hispanics, American Indians and Asian-Americans - are at higher risk.  Age. Your risk increases as you get older. This may be because you tend to exercise less, lose muscle mass and gain weight as you age. But type 2 diabetes is also increasing dramatically among children, adolescents and younger adults.  Gestational diabetes. If you developed gestational diabetes when you were pregnant, your risk of developing prediabetes and type 2 diabetes later increases. If you gave birth to a baby weighing more than 9 pounds (4 kilograms), you're also at risk of type 2 diabetes.  Polycystic ovary syndrome. For women, having polycystic ovary syndrome - a common condition characterized by irregular menstrual periods, excess hair growth and obesity - increases the risk of diabetes.  High blood pressure. Having blood pressure over 140/90 millimeters of mercury (mm Hg) is linked to an increased risk of type 2 diabetes.  Abnormal cholesterol and triglyceride levels. If you have low levels of high-density lipoprotein (HDL), or "good," cholesterol, your risk of type 2 diabetes is higher. Triglycerides are another type of fat carried in the blood. People with high levels of triglycerides have an increased risk of type 2 diabetes. Your doctor can let you know what your cholesterol and  triglyceride levels are.  A good guide to good carbs: The glycemic index ---If you have diabetes, or at risk for diabetes, you know all too well that when you eat carbohydrates, your blood sugar goes up. The total amount of carbs you consume at a meal or in a snack mostly determines what your blood sugar will do. But the food itself also plays a role. A serving of white rice has almost the same effect as eating pure table sugar - a quick, high spike in blood sugar. A serving of lentils has a slower, smaller effect.  ---Picking good sources of carbs can help you control your blood sugar and your weight. Even if you don't have diabetes, eating healthier carbohydrate-rich foods can help ward off a host of chronic conditions, from heart disease to various cancers to, well, diabetes.  ---One way to choose foods is with the glycemic index (GI). This tool measures how much a food boosts blood sugar.  The glycemic index rates the effect of a specific amount of a food on blood sugar compared with the same amount of pure glucose. A food with a glycemic index of 28 boosts blood sugar only 28% as much as pure glucose. One with a GI of 95 acts like pure glucose.    High glycemic foods result in a quick spike in insulin and blood sugar (also known as blood glucose).  Low glycemic foods have a slower, smaller effect- these are healthier for you.   Using the glycemic index Using the glycemic index is easy: choose foods in the low GI category instead of those in the high GI category (see below),  and go easy on those in between. Low glycemic index (GI of 55 or less): Most fruits and vegetables, beans, minimally processed grains, pasta, low-fat dairy foods, and nuts.  Moderate glycemic index (GI 56 to 69): White and sweet potatoes, corn, white rice, couscous, breakfast cereals such as Cream of Wheat and Mini Wheats.  High glycemic index (GI of 70 or higher): White bread, rice cakes, most crackers, bagels, cakes,  doughnuts, croissants, most packaged breakfast cereals. You can see the values for 100 commons foods and get links to more at www.health.RecordDebt.hu.  Swaps for lowering glycemic index  Instead of this high-glycemic index food Eat this lower-glycemic index food  White rice Brown rice or converted rice  Instant oatmeal Steel-cut oats  Cornflakes Bran flakes  Baked potato Pasta, bulgur  White bread Whole-grain bread  Corn Peas or leafy greens       Diabetes Eating Plan  Following a healthy diet can help to keep diabetes under control. It can also help to lower the risks of diabetes and heart disease, which are increased in people who have prediabetes. Along with regular exercise, a healthy diet:  Promotes weight loss.  Helps to control blood sugar levels.  Helps to improve the way that the body uses insulin.   WHAT DO I NEED TO KNOW ABOUT THIS EATING PLAN?   Use the glycemic index (GI) to plan your meals. The index tells you how quickly a food will raise your blood sugar. Choose low-GI foods. These foods take a longer time to raise blood sugar.  Pay close attention to the amount of carbohydrates in the food that you eat. Carbohydrates increase blood sugar levels.  Keep track of how many calories you take in. Eating the right amount of calories will help you to achieve a healthy weight. Losing about 7 percent of your starting weight can help to prevent type 2 diabetes.  You may want to follow a Mediterranean diet. This diet includes a lot of vegetables, lean meats or fish, whole grains, fruits, and healthy oils and fats.   WHAT FOODS CAN I EAT?  Grains Whole grains, such as whole-wheat or whole-grain breads, crackers, cereals, and pasta. Unsweetened oatmeal. Bulgur. Barley. Quinoa. Brown rice. Corn or whole-wheat flour tortillas or taco shells. Vegetables Lettuce. Spinach. Peas. Beets. Cauliflower. Cabbage. Broccoli. Carrots. Tomatoes. Squash. Eggplant. Herbs.  Peppers. Onions. Cucumbers. Brussels sprouts. Fruits Berries. Bananas. Apples. Oranges. Grapes. Papaya. Mango. Pomegranate. Kiwi. Grapefruit. Cherries. Meats and Other Protein Sources Seafood. Lean meats, such as chicken and [place] or lean cuts of pork and beef. Tofu. Eggs. Nuts. Beans. Dairy Low-fat or fat-free dairy products, such as yogurt, cottage cheese, and cheese. Beverages Water. Tea. Coffee. Sugar-free or diet soda. Seltzer water. Milk. Milk alternatives, such as soy or almond milk. Condiments Mustard. Relish. Low-fat, low-sugar ketchup. Low-fat, low-sugar barbecue sauce. Low-fat or fat-free mayonnaise. Sweets and Desserts Sugar-free or low-fat pudding. Sugar-free or low-fat ice cream and other frozen treats. Fats and Oils Avocado. Walnuts. Olive oil. The items listed above may not be a complete list of recommended foods or beverages. Contact your dietitian for more options.    WHAT FOODS ARE NOT RECOMMENDED?  Grains Refined white flour and flour products, such as bread, pasta, snack foods, and cereals. Beverages Sweetened drinks, such as sweet iced tea and soda. Sweets and Desserts Baked goods, such as cake, cupcakes, pastries, cookies, and cheesecake. The items listed above may not be a complete list of foods and beverages to avoid. Contact your dietitian  for more information.   This information is not intended to replace advice given to you by your health care provider. Make sure you discuss any questions you have with your health care provider.   Document Released: 05/13/2014 Document Reviewed: 05/13/2014 Elsevier Interactive Patient Education [employer]

## 2016-07-04 NOTE — Assessment & Plan Note (Addendum)
He is doing well Will stop his doxy He has completed > 4 weeks of therapy and is well healing.  I have asked him to call if any problems- erythema, d/c, f/c.  rtc prn

## 2016-07-04 NOTE — Progress Notes (Signed)
   Subjective:    Patient ID: Michael Hutchinson, male    DOB: 1975/12/05, 41 y.o.   MRN: [id number]  HPI 41 yo M with hx of DM1 and previous L BKA 01-01-16. He states his wound healed well and he had minimal flaking of his wound.  However in April he developed erythema, swelling and tenderness at his wound after his prosthesis rubbed his wound.  On 4-17 after removing his bandage, he described his knee as "puking black, red, brown" material.  He came to the hospital and went to OR on 4-18 with drainage of abscess and placement of wound VAC. Marland Kitchen  His Cx's grew MRSA.  He was sent home on vancomycin on 4-23 however by 5-23 he was refusing blood work and was changed to doxy po.  Wound is "almost gone', not leaking anymore.  Has almost finished doxy.  Has had f/u with Dr Victorino Dike.  FSG have been up to 300.   Review of Systems  Constitutional: Negative for chills and fever.  Gastrointestinal: Negative for constipation and diarrhea.  Genitourinary: Negative for difficulty urinating.  Skin: Negative for wound.  Neurological: Positive for numbness.       Objective:   Physical Exam  Constitutional: He appears well-developed and well-nourished.  Neck: Neck supple.  Musculoskeletal:       Legs:         Assessment & Plan:

## 2016-07-04 NOTE — Progress Notes (Signed)
Impression and Recommendations:    1. Type 1 diabetes mellitus with microalbuminuria (HCC)   2. Essential hypertension   3. Hx of BKA, left - with subsequent cellulitis and treatment for osteomylitis   4. Low HDL (under 40)   5. Obesity, Class III, BMI 40-49.9 (morbid obesity) ([place])   6. Chronic fatigue   7. Impaired exercise tolerance/ poor CV conditioning      1. )    We decided today that patient will work diligently over the next 3 weeks on his diet and taking his medicine -Lantus and other insulin regularly.   He'll make a conscientious effort to take the Lantus before he thinks he is going to fall sleep at night.    -  Patient declines medication change for his diabetes today and will be more diligent about taking his medicines on a regular basis.    - He also agrees to go for diabetes educator appointment and learn more about how to eat and what to eat.  Referral placed today.  -   Handout given on low glycemic index foods after our discussion in the office today.  1. 6. )   We will recheck his A1c along with testosterone in the morning approximately a month from now.    Explained to patient that fatigue is likely secondary to history of recent hospitalization and major surgery along with his poor cardiovascular health and fitness.     - However he has a question about his testosterone we will check that in the near future.  2. )    Restart losartan at lower dose.  25 mg twice a day.   -  He will check his blood pressure at home and work towards goal of 130/80 or less on a regular basis.    - We'll consider adding carvedilol next office visit as he was on metoprolol in the distant past.  He tolerated these medicines well besides the lisinopril giving him cough   1. 5. )    referral for diabetes educator sent today.   We are hoping to improve his diet and get him to move more-  which can result in weight loss.    He will monitor his blood sugars and blood pressure at home-  buy a blood pressure monitor for 20-30 bucks.     If you take the losartan and feel a little dizzy after it, please check your blood pressure to see if it's your blood pressure.    Fasting blood sugar goal should be 120 or 120 or less and fasting 2 hour postprandial after his biggest meal should be under 180.  Also blood pressure goals are 130/80 or less.  6.)    Did have a home study for obstructive sleep apnea and was not found to have it approximately a year and a half to 2 years ago.  We will assess upcoming labs and if testosterone and other labs are normal and he still not feeling well ( excessive tiredness/ feels unrested) we will consider sending him for an official sleep study as he is a higher risk candidate due to body habitus.   Education and routine counseling performed. Handouts provided.  New Prescriptions   LOSARTAN (COZAAR) 25 MG TABLET    Take 1 tablet (25 mg total) by mouth 2 (two) times daily.    Orders Placed This Encounter  Procedures  . Hemoglobin A1c  . Testosterone Free, Profile I  . Amb Referral to Nutrition  and Diabetic E    Return in about 6 weeks (around 08/15/2016) for 1 month lab-only testosterone and A1c; then 6 week follow-up for BP(restart med) and DM (BS log) .  The patient was counseled, risk factors were discussed, anticipatory guidance given.  Gross side effects, risk and benefits, and alternatives of medications discussed with patient.  Patient is aware that all medications have potential side effects and we are unable to predict every side effect or drug-drug interaction that may occur.  Expresses verbal understanding and consents to current therapy plan and treatment regimen.  Please see AVS handed out to patient at the end of our visit for further patient instructions/ counseling done pertaining to today's office visit.    Note: This document was prepared using Dragon voice recognition software and may include unintentional dictation  errors.     Subjective:    Chief Complaint  Patient presents with  . Hypertension  . Hyperlipidemia     ROCKLAND KOTARSKI is a 41 y.o. male who presents to [place] at [facility] today for Diabetes Management.      ---->  Physical therapy for his stump and new leg prosthesis is going well.  He goes twice weekly.  She wants him to start a walking program 10 minutes 2 times a day or as much as possible.  -----> Pt c/o being tired a lot of the time. Lacking energy to do things.  Thinks it is his Testosterone level and would like checked.    ----->  Has a home sleep study in past 2 yrs ago or so, did not show + for OSA. Snores a lot.   Large neck/ body habitus    --->   Patient continues to struggle with compliance of medications,  Follow-up appointments with physicians, checking blood sugars and blood pressures etc.   HTN:   ---->  Patient never continued take his losartan.  He has been given scripts by other physicians as well and has not been taking any blood pressure medicines for past 1-2 months  -->   Patient was seen 5\2018 for dizziness due to ear infection.  Patient had reported to [place] at that time that medicine losartan, was making him dizzy when fact it was likely the ear infection.  As patient has been on losartan since September 2017 and never had these complaints prior.   Patient had taken losartan for many months which I started him back on a blood pressure medicine when I saw him at his first office visit.      DM HPI: ----->   Referred on 08/25/15 to Endo for care of poorly controlled DM. =--->  Dr Letta Median;   Last seen by Endo December 2017--> Pt non-compliant.  Pt lost to f/up, when he rescheduled - due to see them Sept.   -  He has been working on diet and exercise for diabetes trying to move around more.  Eating less than he ever has but still not great, not real healthy choices.   Pt is currently maintained on the following medications for diabetes:    see med list today  Medication compliance :   not great- forgets his lantus / insulin 25-30% of the time  Home glucose readings range averaging 200-300 FBS's in AM's last few wks, prior around 150-200.     Denies polyuria/polydipsia.  Denies hypo/ hyperglycemia symptoms  - He denies new onset of: chest pain, exercise intolerance, shortness of breath, dizziness, visual changes, headache,  lower extremity swelling or claudication.   Foot exam- UTD  Last A1C in the office was:  Lab Results  Component Value Date   HGBA1C 8.4 (H) 04/26/2016   HGBA1C 11.5 (H) 12/31/2015   HGBA1C 10.8 (H) 09/15/2015    Lab Results  Component Value Date   LDLCALC 76 09/15/2015   CREATININE 1.21 06/24/2016    Last 3 blood pressure readings in our office are as follows: BP Readings from Last 3 Encounters:  07/04/16 123/84  07/04/16 115/81  06/25/16 (!) 134/95   Filed Weights   07/04/16 1038  Weight: (!) 317 lb (143.8 kg)    Body mass index is 45.48 kg/m.    Patient Care Team    Relationship Specialty Notifications Start End  Mellody Dance, DO PCP - General Family Medicine All results, Admissions 12/31/15   Fayette Pho, PA Attending Physician Orthopedic Surgery  06/01/16      Patient Active Problem List   Diagnosis Date Noted  . Hx of BKA, left - with subsequent cellulitis and treatment for osteomylitis 06/01/2016    Priority: High  . Obesity, Class III, BMI 40-49.9 (morbid obesity) ([place]) 09/22/2015    Priority: High  . Diabetes mellitus type 1 (HCC)     Priority: High  . Essential hypertension 05/03/2013    Priority: High  . Hyperlipidemia 07/26/2012    Priority: High  . Low HDL (under 40) 09/22/2015    Priority: Medium  . GERD (gastroesophageal reflux disease)     Priority: Medium  . Hx MRSA infection 09/22/2015    Priority: Low  . Chronic fatigue 07/04/2016  . Impaired exercise tolerance 07/04/2016  . Acute left otitis media 06/09/2016  . Abscess of left leg 04/26/2016   . Diabetes mellitus with complication ([place])   . Normocytic anemia 01/01/2016  . Osteomyelitis ([place]) 12/31/2015  . Diabetic ulcer of left heel associated with type 1 diabetes mellitus, with necrosis of bone ([place]) 12/31/2015  . Ankle wound 12/31/2015  . Chronic osteomyelitis of left ankle with draining sinus ([place])   . Type 1 diabetes, uncontrolled, with Charcot's joint of foot ([place]) 10/12/2015  . H/O noncompliance with medical treatment, presenting hazards to health 09/22/2015  . Vitamin D deficiency 09/22/2015  . Charcot's joint of foot 11/25/2013  . Diabetic ketoacidosis ([place])   . Meniscus tear   . Epigastric pain 12/10/2011  . Vomiting 12/10/2011  . Problems influencing health status 11/21/2011     Past Medical History:  Diagnosis Date  . Charcot's joint of foot 11/25/2013  . Complication of anesthesia    "I wake up angry" (12/31/2015)  . Diabetes mellitus type 1 ([place]) dx'd 1981  . Diabetic ketoacidosis ([place])   . Essential hypertension 05/03/2013  . GERD (gastroesophageal reflux disease)   . High cholesterol   . Hx MRSA infection    Inner thigh and under arm- healed areas  . Meniscus tear   . Shortness of breath    with exertion only     Past Surgical History:  Procedure Laterality Date  . AMPUTATION Left 01/01/2016   Procedure: AMPUTATION BELOW KNEE;  Surgeon: [name], MD;  Location: [facility];  Service: Orthopedics;  Laterality: Left;  . APPLICATION OF WOUND VAC  04/26/2016  . FRACTURE SURGERY    . HERNIA REPAIR    . I&D EXTREMITY Left 04/26/2016   Procedure: IRRIGATION AND DEBRIDEMENT EXTREMITY/Left Leg/Possible Wound Vac;  Surgeon: [name], MD;  Location: [facility];  Service: Orthopedics;  Laterality: Left;  . INCISE AND  DRAIN ABCESS Left 04/26/2016  . KNEE ARTHROSCOPY Left ~ 2010  . LAPAROSCOPIC CHOLECYSTECTOMY  2015  . METACARPOPHALANGEAL JOINT ARTHRODESIS Left 06/2012   Fracture left index finger intra-articular MCP joint/notes 06/30/2012  . OPEN REDUCTION  INTERNAL FIXATION (ORIF) PROXIMAL PHALANX Left 06/30/2012   Procedure: OPEN REDUCTION INTERNAL FIXATION (ORIF) LEFT INDEX FINGER PROXIMAL PHALANX FRACTURE WITH LIGAMENT REPAIR AS NECESSARY;  Surgeon: [name], MD;  Location: [facility];  Service: Orthopedics;  Laterality: Left;  . UMBILICAL HERNIA REPAIR  2015   "w/gallbladder OR"     Family History  Problem Relation Age of Onset  . Other Mother   . Cancer Mother        Breast / Bone  . Heart attack Father   . Hypertension Father   . Hyperlipidemia Father   . Diabetes Unknown   . Alcohol abuse Sister   . Diabetes Maternal Grandfather   . Stroke Paternal Grandmother   . Alcohol abuse Paternal Grandfather      History  Drug Use No  ,  History  Alcohol Use  . 8.4 oz/week  . 14 Cans of beer per week  ,  History  Smoking Status  . Never Smoker  Smokeless Tobacco  . Former Systems developer  . Types: Snuff, Chew  . Quit date: 08/25/2010  ,    Current Outpatient Prescriptions on File Prior to Visit  Medication Sig Dispense Refill  . atorvastatin (LIPITOR) 40 MG tablet TAKE 1 TABLET BY MOUTH DAILY AT 6 PM. 30 tablet 0  . BAYER MICROLET LANCETS lancets Use as instructed to check 3 times daily. 100 each 12  . Blood Glucose Monitoring Suppl (ONE TOUCH ULTRA SYSTEM KIT) w/Device KIT 1 kit by Does not apply route once. Check blood sugar 3 times daily. Diagnosis Diabetes ICD-10 E11.8    . doxycycline (VIBRA-TABS) 100 MG tablet Take 1 tablet (100 mg total) by mouth 2 (two) times daily. 42 tablet 1  . glucagon (GLUCAGON EMERGENCY) 1 MG injection Inject 1 mg into the muscle once as needed. 1 each 11  . glucose blood (BAYER CONTOUR NEXT TEST) test strip Use as instructed to check 3 times daily. 100 each 12  . ibuprofen (ADVIL,MOTRIN) 800 MG tablet Take 1 tablet (800 mg total) by mouth 3 (three) times daily. 21 tablet 0  . insulin glargine (LANTUS) 100 UNIT/ML injection Inject 40 units at bedtime 500 mL 1  . insulin lispro (HUMALOG) 100 UNIT/ML  injection Inject 0.03-0.2 mLs (3-20 Units total) into the skin 3 (three) times daily before meals. Sliding scale per carb count 4 units for every 30 carbs 10 mL 2  . Insulin Pen Needle (BD PEN NEEDLE NANO U/F) 32G X 4 MM MISC 1 pen by Does not apply route 3 (three) times daily.     No current facility-administered medications on file prior to visit.      Allergies  Allergen Reactions  . Influenza Vac Split Quad Nausea And Vomiting  . Hydrochlorothiazide     Dizziness  . Lisinopril Cough  . Losartan     Dizziness     Review of Systems:   General:  Denies fever, chills Optho/Auditory:   Denies visual changes, blurred vision Respiratory:   Denies SOB, cough, wheeze, DIB  Cardiovascular:   Denies chest pain, palpitations, painful respirations Gastrointestinal:   Denies nausea, vomiting, diarrhea.  Endocrine:     Denies new hot or cold intolerance Musculoskeletal:  Denies joint swelling, gait issues, or new unexplained myalgias/ arthralgias Skin:  Denies rash, suspicious lesions  Neurological:    Denies dizziness, unexplained weakness, numbness  Psychiatric/Behavioral:   Denies mood changes    Objective:     Blood pressure 115/81, pulse (!) 104, height '5\' 10"'$  (1.778 m), weight (!) 317 lb (143.8 kg).  Body mass index is 45.48 kg/m.  General: Well Developed, well nourished, and in no acute distress.  HEENT: Normocephalic, atraumatic, pupils equal round reactive to light, neck supple, No carotid bruits, no JVD Skin: Warm and dry, cap RF less 2 sec Cardiac:  Regular rate and rhythm, S1, S2 WNL's, no murmurs rubs or gallops Respiratory: distant but ECTA B/L, Not using accessory muscles, speaking in full sentences. NeuroM-Sk: Ambulates w/o assistance on prosthesis, moves ext * 4.  Psych: No HI/SI, judgement and insight good, Euthymic mood. Full Affect.

## 2016-07-05 NOTE — Therapy (Signed)
[facility] [address] [place], [place], [address] Phone: [phone]   Fax:  [phone]  Physical Therapy Treatment  Patient Details  Name: Michael Hutchinson MRN: [id number] Date of Birth: Apr 14, 1975 Referring Provider: Dr. Toni Arthurs  Encounter Date: 07/04/2016      PT End of Session - 07/04/16 1543    Visit Number 7   Number of Visits 21   Date for PT Re-Evaluation 08/19/16   Authorization Type [employer] 30 visit limit with 23 visits remaining   Authorization - Visit Number 6   Authorization - Number of Visits 23   PT Start Time 1316   PT Stop Time 1400   PT Time Calculation (min) 44 min   Equipment Utilized During Treatment Gait belt   Activity Tolerance Patient tolerated treatment well   Behavior During Therapy WFL for tasks assessed/performed      Past Medical History:  Diagnosis Date  . Charcot's joint of foot 11/25/2013  . Complication of anesthesia    "I wake up angry" (12/31/2015)  . Diabetes mellitus type 1 (HCC) dx'd 1981  . Diabetic ketoacidosis (HCC)   . Essential hypertension 05/03/2013  . GERD (gastroesophageal reflux disease)   . High cholesterol   . Hx MRSA infection    Inner thigh and under arm- healed areas  . Meniscus tear   . Shortness of breath    with exertion only    Past Surgical History:  Procedure Laterality Date  . AMPUTATION Left 01/01/2016   Procedure: AMPUTATION BELOW KNEE;  Surgeon: Toni Arthurs, MD;  Location: [facility] OR;  Service: Orthopedics;  Laterality: Left;  . APPLICATION OF WOUND VAC  04/26/2016  . FRACTURE SURGERY    . HERNIA REPAIR    . I&D EXTREMITY Left 04/26/2016   Procedure: IRRIGATION AND DEBRIDEMENT EXTREMITY/Left Leg/Possible Wound Vac;  Surgeon: Toni Arthurs, MD;  Location: [facility] OR;  Service: Orthopedics;  Laterality: Left;  . INCISE AND DRAIN ABCESS Left 04/26/2016  . KNEE ARTHROSCOPY Left ~ 2010  . LAPAROSCOPIC CHOLECYSTECTOMY  2015  . METACARPOPHALANGEAL  JOINT ARTHRODESIS Left 06/2012   Fracture left index finger intra-articular MCP joint/notes 06/30/2012  . OPEN REDUCTION INTERNAL FIXATION (ORIF) PROXIMAL PHALANX Left 06/30/2012   Procedure: OPEN REDUCTION INTERNAL FIXATION (ORIF) LEFT INDEX FINGER PROXIMAL PHALANX FRACTURE WITH LIGAMENT REPAIR AS NECESSARY;  Surgeon: Dominica Severin, MD;  Location: [facility] OR;  Service: Orthopedics;  Laterality: Left;  . UMBILICAL HERNIA REPAIR  2015   "w/gallbladder OR"    There were no vitals filed for this visit.      Subjective Assessment - 07/04/16 1324    Subjective He is wearing prosthesis all awake hours with no issues. Walking including inclines with cane.    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 No/denies                         [facility] Adult PT Treatment/Exercise - 07/04/16 1316      Transfers   Transfers Sit to Stand;Stand to Sit;Floor to Transfer   Sit to Stand 6: Modified independent (Device/Increase time);With upper extremity assist;From chair/3-in-1   Stand  to Sit 6: Modified independent (Device/Increase time);With upper extremity assist;To chair/3-in-1   Floor to Transfer 5: Supervision;With upper extremity assist  pushing on mat table BUE, then wall & chair 1 hand   Floor to Transfer Details (indicate cue type and reason) Verbal cues for technique. Progressed from BUE pushing on mat table to one hand on wall & one hand on chair bottom alternating UEs & lead limb.      Ambulation/Gait   Ambulation/Gait Yes   Ambulation/Gait Assistance 5: Supervision   Ambulation/Gait Assistance Details verbal cues on posture & wt shift   Ambulation Distance  (Feet) 1000 Feet   Assistive device Prosthesis;Straight cane;None   Gait Pattern Step-through pattern;Decreased stride length;Antalgic  rt ankle ever. noted in stance;antalgic with fatigue   Ambulation Surface Indoor;Level;Outdoor;Paved;Gravel;Grass  grass slope; indoor no device & outdoor cane   Stairs Yes   Stairs Assistance 5: Supervision   Stairs Assistance Details (indicate cue type and reason) verbal cues on wt shift & foot position   Stair Management Technique One rail Left;One rail Right;Alternating pattern;Forwards;With cane   Number of Stairs 4  3 reps with 2 rails & 3 reps with 1 rail   Ramp 5: Supervision  prosthesis only short indoor & cane outdoor   Ramp Details (indicate cue type and reason) verbal cues on wt shift   Curb 5: Supervision  no device     High Level Balance   High Level Balance Activities Negotiating over obstacles;Head turns;Side stepping;Backward walking;Direction changes;Turns;Tandem walking   High Level Balance Comments verbal cues on technique with prosthesis, maintain path & pace with head turns in hall for visual reference.     Therapeutic Activites    Therapeutic Activities Lifting;Work Counselling psychologist;Other Therapeutic Activities   Lifting PT verbal cues on technique to pick up objects from floor. Pt return demo lifting 1#, 15# box & 25# box with verbal cues.    Work Counselling psychologist --   Other Therapeutic Activities Pt climb A-frame ladder and simulated 2 hand task standing on ladder with PT verbal cues.      Exercises   Exercises Knee/Hip     Knee/Hip Exercises: Standing   Forward Step Up --     Prosthetics   Prosthetic Care Comments  --   Current prosthetic wear tolerance (days/week)  daily   Current prosthetic wear tolerance (#hours/day)  all awake hours with breaks to dry through out day   Residual limb condition  no open areas   Education Provided --                PT Education - 07/04/16 1330    Education provided Yes   Education  Details proper shoe design to prevent supination with community referral   Person(s) Educated Patient;Spouse   Methods Explanation;Verbal cues   Comprehension Verbalized understanding          PT Short Term Goals - 06/14/16 1928      PT SHORT TERM GOAL #1   Title Patient wil verbalize understanding and adhere to proper prosthesis wear schedule and prosthesis care. (TARGET DATE: 07/15/2016)   Baseline PT reset target date with delayed treatment with wound on limb / hospitalization   Time 5   Period Weeks   Status On-going     PT SHORT TERM GOAL #2   Title Patient will verbalize understanding and demonstrate HEP activities and exercises. (TARGET DATE: 07/15/2016)   Baseline PT reset target date with delayed treatment with wound on limb / hospitalization   Time  5   Period Weeks   Status On-going     PT SHORT TERM GOAL #3   Title Patient will score >32/56 on Berg Balance to indicate a decrease in his risk of falling. (TARGET DATE: 07/15/2016)   Baseline PT reset target date with delayed treatment with wound on limb / hospitalization   Time 5   Period Weeks   Status On-going     PT SHORT TERM GOAL #4   Title Patient will demonstrate ability to ambulate on level, indoor surfaces for 250' with prosthesis and LRAD with supervision. (TARGET DATE: 07/15/2016)   Baseline PT reset target date with delayed treatment with wound on limb / hospitalization   Time 5   Period Weeks   Status On-going           PT Long Term Goals - 06/14/16 1945      PT LONG TERM GOAL #1   Title Patient will verbalize and demonstrate understanding of his ongoing HEP program and fitness. (TARGET DATE: 08/19/2016)   Baseline PT reset target date with delayed treatment with wound on limb / hospitalization   Time 10   Period Weeks   Status On-going     PT LONG TERM GOAL #2   Title Patient will score >44/56 on the Berg Balance test to indicate a decrease in his risk of falling. (TARGET DATE: 08/19/2016)   Baseline  PT reset target date with delayed treatment with wound on limb / hospitalization   Time 10   Period Weeks   Status On-going     PT LONG TERM GOAL #3   Title Patient will perform a TUG with prosthesis only in <13s to indicate a decrease in his risk of falling. (TARGET DATE: 08/19/2016)   Baseline PT reset target date with delayed treatment with wound on limb / hospitalization   Time 10   Period Weeks   Status On-going     PT LONG TERM GOAL #4   Title Patient will complete an Functional Gait Assessment with a score of >18 to indicate a decrease in his risk of falling. (TARGET DATE: 08/19/2016)   Baseline PT reset target date with delayed treatment with wound on limb / hospitalization   Time 10   Period Weeks   Status On-going     PT LONG TERM GOAL #5   Title Patient will ambulate 1000' on unlevel surfaces (outdoors, ramps, curbs, and grass) with LRAD and prosthesis. (TARGET DATE: 08/19/2016)   Baseline PT reset target date with delayed treatment with wound on limb / hospitalization   Time 10   Period Weeks   Status On-going     PT LONG TERM GOAL #6   Title Patient performs functional task of lifting, pushing, pulling with prosthesis safely.  (Target Date: 08/19/2016)   Time 10   Period Weeks   Status On-going               Plan - 07/04/16 1543    Clinical Impression Statement Patient improved floor transfer progressing to use of combo of horizontal & vertical support. Patient improved scanning side to side & up/down but still has difficulty with diagonals.    Rehab Potential Good   Clinical Impairments Affecting Rehab Potential DM type 1, charcot foot, HTN, HLD   PT Frequency 2x / week   PT Duration Other (comment)  10 weeks   PT Treatment/Interventions ADLs/Self Care Home Management;DME Instruction;Gait training;Stair training;Functional mobility training;Therapeutic activities;Therapeutic exercise;Balance training;Prosthetic Training;Patient/family education;Neuromuscular  re-education;Scar mobilization  PT Next Visit Plan balance and continued with prosthetic training, outside on compliant surfaces weather permitting       Consulted and Agree with Plan of Care Patient;Family member/caregiver   Family Member Consulted Wife - Marchelle Folks       Patient will benefit from skilled therapeutic intervention in order to improve the following deficits and impairments:  Abnormal gait, Decreased activity tolerance, Decreased balance, Decreased knowledge of precautions, Decreased knowledge of use of DME, Decreased mobility, Difficulty walking, Decreased strength, Decreased skin integrity, Postural dysfunction, Pain  Visit Diagnosis: Other abnormalities of gait and mobility  Unsteadiness on feet  Muscle weakness (generalized)     Problem List Patient Active Problem List   Diagnosis Date Noted  . Chronic fatigue 07/04/2016  . Impaired exercise tolerance 07/04/2016  . Acute left otitis media 06/09/2016  . Hx of BKA, left - with subsequent cellulitis and treatment for osteomylitis 06/01/2016  . Abscess of left leg 04/26/2016  . Diabetes mellitus with complication (HCC)   . Normocytic anemia 01/01/2016  . Osteomyelitis (HCC) 12/31/2015  . 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 Ankle wound 12/31/2015  . Chronic osteomyelitis of left ankle with draining sinus (HCC)   . Type 1 diabetes, uncontrolled, with Charcot's joint of foot (HCC) 10/12/2015  . Low HDL (under 40) 09/22/2015  . Obesity, Class III, BMI 40-49.9 (morbid obesity) (HCC) 09/22/2015  . H/O noncompliance with medical treatment, presenting hazards to health 09/22/2015  . Hx MRSA infection 09/22/2015  . Vitamin D deficiency 09/22/2015  . Diabetes mellitus type 1 (HCC)   . Charcot's joint of foot 11/25/2013  . Essential hypertension 05/03/2013  . Hyperlipidemia 07/26/2012  . Diabetic ketoacidosis (HCC)   . GERD (gastroesophageal  reflux disease)   . Meniscus tear   . Epigastric pain 12/10/2011  . Vomiting 12/10/2011  . Problems influencing health status 11/21/2011    Vladimir Faster PT, DPT 07/05/2016, 5:46 AM  [facility] [address] [place], [place], [address] Phone: [phone]   Fax:  [phone]  Name: Michael Hutchinson MRN: [id number] Date of Birth: 12-17-1975

## 2016-07-06 ENCOUNTER — Ambulatory Visit: Payer: BLUE CROSS/BLUE SHIELD | Admitting: Physical Therapy

## 2016-07-06 ENCOUNTER — Encounter: Payer: Self-pay | Admitting: Physical Therapy

## 2016-07-06 DIAGNOSIS — R2689 Other abnormalities of gait and mobility: Secondary | ICD-10-CM | POA: Diagnosis not present

## 2016-07-06 DIAGNOSIS — M6281 Muscle weakness (generalized): Secondary | ICD-10-CM | POA: Diagnosis not present

## 2016-07-06 DIAGNOSIS — R2681 Unsteadiness on feet: Secondary | ICD-10-CM | POA: Diagnosis not present

## 2016-07-06 NOTE — Patient Instructions (Addendum)
1. Stand with support on both sides for your hands, roll tennis ball forward/backwards, side to side, and in circles. Perform with both legs.   SIT DOWN when putting the band on and taking the band off your ankle!! Perform all of these exercises while holding onto a countertop or chair. Maintain an upright posture when completing exercises.   ABDUCTION: Standing - Resistance Band (Active)    Stand, feet flat. Against yellow resistance band, lift right leg out to side. Complete _1__ sets of _10__ repetitions on EACH leg. Perform _1-2__ sessions per day.  http://gtsc.exer.us/116   Copyright  VHI. All rights reserved.  EXTENSION: Standing - Resistance Band (Active)    Stand, both feet flat. Against yellow resistance band, draw right leg behind body as far as possible. **Place band around your ankle NOT around your knee as shown in this picture**  Complete _1__ sets of _10__ repetitions on EACH leg. Perform _1-2__ sessions per day.  http://gtsc.exer.us/82   Copyright  VHI. All rights reserved.  EXTENSION: Standing - Stable: Resistance Band (Active)    Stand with right knee bent. Against yellow resistance band, straighten fully, moving foot forward. Complete _1__ sets of _10__ repetitions on EACH leg. Perform _1-2__ sessions per day.  Copyright  VHI. All rights reserved.  ADDUCTION: Standing - Stable: Resistance Band (Active)    Stand, right leg out to side as far as possible. Against yellow resistance band, draw leg in across midline. Complete _1__ sets of _10__ repetitions on EACH leg. Perform _1-2__ sessions per day.  http://gtsc.exer.us/150   Copyright  VHI. All rights reserved.

## 2016-07-06 NOTE — Therapy (Signed)
[facility] [address] [place], [place], [address] Phone: [phone]   Fax:  [phone]  Physical Therapy Treatment  Patient Details  Name: Michael Hutchinson MRN: [id number] Date of Birth: 1975/03/01 Referring Provider: Dr. Toni Arthurs  Encounter Date: 07/06/2016      PT End of Session - 07/06/16 1323    Visit Number 8   Number of Visits 21   Date for PT Re-Evaluation 08/19/16   Authorization Type [employer] 30 visit limit with 23 visits remaining   Authorization - Visit Number 8   Authorization - Number of Visits 23   PT Start Time 1232   PT Stop Time 1314   PT Time Calculation (min) 42 min   Equipment Utilized During Treatment Gait belt   Activity Tolerance Patient tolerated treatment well   Behavior During Therapy WFL for tasks assessed/performed      Past Medical History:  Diagnosis Date  . Charcot's joint of foot 11/25/2013  . Complication of anesthesia    "I wake up angry" (12/31/2015)  . Diabetes mellitus type 1 (HCC) dx'd 1981  . Diabetic ketoacidosis (HCC)   . Essential hypertension 05/03/2013  . GERD (gastroesophageal reflux disease)   . High cholesterol   . Hx MRSA infection    Inner thigh and under arm- healed areas  . Meniscus tear   . Shortness of breath    with exertion only    Past Surgical History:  Procedure Laterality Date  . AMPUTATION Left 01/01/2016   Procedure: AMPUTATION BELOW KNEE;  Surgeon: Toni Arthurs, MD;  Location: [facility] OR;  Service: Orthopedics;  Laterality: Left;  . APPLICATION OF WOUND VAC  04/26/2016  . FRACTURE SURGERY    . HERNIA REPAIR    . I&D EXTREMITY Left 04/26/2016   Procedure: IRRIGATION AND DEBRIDEMENT EXTREMITY/Left Leg/Possible Wound Vac;  Surgeon: Toni Arthurs, MD;  Location: [facility] OR;  Service: Orthopedics;  Laterality: Left;  . INCISE AND DRAIN ABCESS Left 04/26/2016  . KNEE ARTHROSCOPY Left ~ 2010  . LAPAROSCOPIC CHOLECYSTECTOMY  2015  . METACARPOPHALANGEAL  JOINT ARTHRODESIS Left 06/2012   Fracture left index finger intra-articular MCP joint/notes 06/30/2012  . OPEN REDUCTION INTERNAL FIXATION (ORIF) PROXIMAL PHALANX Left 06/30/2012   Procedure: OPEN REDUCTION INTERNAL FIXATION (ORIF) LEFT INDEX FINGER PROXIMAL PHALANX FRACTURE WITH LIGAMENT REPAIR AS NECESSARY;  Surgeon: Dominica Severin, MD;  Location: [facility] OR;  Service: Orthopedics;  Laterality: Left;  . UMBILICAL HERNIA REPAIR  2015   "w/gallbladder OR"    There were no vitals filed for this visit.      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 Patient denies any falls since last visit. Patient reports he is able to perform his job duties, but still needs to practice being able to ambulate up/down inclined surfaces.    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 Yes   Pain Score 3    Pain Location Leg   Pain Orientation Right   Pain Type Acute pain   Pain Onset In the past  7 days                         [facility] Adult PT Treatment/Exercise - 07/06/16 1230      Transfers   Transfers Sit to Stand;Stand to Sit;Floor to Transfer   Sit to Stand 6: Modified independent (Device/Increase time);With upper extremity assist;From chair/3-in-1   Stand to Sit 6: Modified independent (Device/Increase time);With upper extremity assist;To chair/3-in-1   Floor to Transfer --     Ambulation/Gait   Ambulation/Gait Yes   Ambulation/Gait Assistance 5: Supervision   Ambulation/Gait Assistance Details Requires cueing for  weight shift over prosthesis   Ambulation Distance (Feet) --  multiple laps in hallway; ambulation between activities    Assistive device Prosthesis;Straight cane;None   Gait Pattern Step-through pattern;Decreased stride length;Antalgic  rt ankle ever. noted in stance;antalgic with fatigue   Ambulation Surface Level;Indoor   Stairs --   Stairs Assistance --   Stair Management Technique --   Number of Stairs --   Ramp --   Curb --     High Level Balance   High Level Balance Activities Backward walking;Direction changes;Turns;Side stepping;Other (comment);Weight-shifting turns  Head turns (diagonals); fast pace ambulation    High Level Balance Comments Performed in hallway without an assistive device. Required min guard with diagonal head turns. Required intermittent use of UEs on walls to maintain balance when changing directions.     Therapeutic Activites    Therapeutic Activities --   Lifting --   Other Therapeutic Activities --     Neuro Re-ed    Neuro Re-ed Details  In parallel bars: performed single limb stance with BUE support on bars + rolling tennis ball (fwd/backwards, side to side, and in circle). Required demo and cueing for proper weight shift on stance limb     Exercises   Exercises Other Exercises   Other Exercises  See exercises for LE strengthening and balance with prosthesis listed, below, from patient instructions.      Prosthetics   Prosthetic Care Comments  Patient presents to PT with 16-ply socks. Patient reports he is visiting prosthetist to have pads added.   Current prosthetic wear tolerance (days/week)  daily   Current prosthetic wear tolerance (#hours/day)  all awake hours with breaks to dry through out day   Residual limb condition  Medial aspect of distal tibia has a small (approximately 2mm) wound that appears it could be due to a suture   Education Provided Residual limb care   Person(s) Educated Patient   Education Method Explanation   Education  Method Verbalized understanding       1. Stand with support on both sides for your hands, roll tennis ball forward/backwards, side to side, and in circles. Perform with both legs.   SIT DOWN when putting the band on and taking the band off your ankle!! Perform all of these exercises while holding onto a countertop or chair. Maintain an upright posture when completing exercises.   ABDUCTION: Standing - Resistance Band (Active)    Stand, feet flat. Against yellow resistance band, lift right leg out to side. Complete _1__ sets of _10__ repetitions on EACH leg. Perform _1-2__ sessions per day.  http://gtsc.exer.us/116   Copyright  VHI. All rights reserved.  EXTENSION: Standing - Resistance Band (Active)    Stand, both feet flat. Against yellow resistance band, draw right leg behind body as far as possible. **Place band around your ankle NOT around your knee as shown in this  picture**  Complete _1__ sets of _10__ repetitions on EACH leg. Perform _1-2__ sessions per day.  http://gtsc.exer.us/82   Copyright  VHI. All rights reserved.  EXTENSION: Standing - Stable: Resistance Band (Active)    Stand with right knee bent. Against yellow resistance band, straighten fully, moving foot forward. Complete _1__ sets of _10__ repetitions on EACH leg. Perform _1-2__ sessions per day.  Copyright  VHI. All rights reserved.  ADDUCTION: Standing - Stable: Resistance Band (Active)    Stand, right leg out to side as far as possible. Against yellow resistance band, draw leg in across midline. Complete _1__ sets of _10__ repetitions on EACH leg. Perform _1-2__ sessions per day.  http://gtsc.exer.us/150   Copyright  VHI. All rights reserved.            PT Education - 07/06/16 1236    Education provided Yes   Education Details use of a block/step and changing foot position when completing static standing work activities to decrease stress on back; walk without cane until RLE  circulation pain increases to 2-3/10 - then begin to utilize cane and when pain 6-7/10 stop walking for rest; hip exercise HEP; set up appointment with prosthetist Brett Canales) after today's PT appointment to add pads as appropriate   Person(s) Educated Patient   Methods Explanation;Demonstration;Verbal cues;Tactile cues;Handout   Comprehension Verbalized understanding;Returned demonstration          PT Short Term Goals - 06/14/16 1928      PT SHORT TERM GOAL #1   Title Patient wil verbalize understanding and adhere to proper prosthesis wear schedule and prosthesis care. (TARGET DATE: 07/15/2016)   Baseline PT reset target date with delayed treatment with wound on limb / hospitalization   Time 5   Period Weeks   Status On-going     PT SHORT TERM GOAL #2   Title Patient will verbalize understanding and demonstrate HEP activities and exercises. (TARGET DATE: 07/15/2016)   Baseline PT reset target date with delayed treatment with wound on limb / hospitalization   Time 5   Period Weeks   Status On-going     PT SHORT TERM GOAL #3   Title Patient will score >32/56 on Berg Balance to indicate a decrease in his risk of falling. (TARGET DATE: 07/15/2016)   Baseline PT reset target date with delayed treatment with wound on limb / hospitalization   Time 5   Period Weeks   Status On-going     PT SHORT TERM GOAL #4   Title Patient will demonstrate ability to ambulate on level, indoor surfaces for 250' with prosthesis and LRAD with supervision. (TARGET DATE: 07/15/2016)   Baseline PT reset target date with delayed treatment with wound on limb / hospitalization   Time 5   Period Weeks   Status On-going           PT Long Term Goals - 06/14/16 1945      PT LONG TERM GOAL #1   Title Patient will verbalize and demonstrate understanding of his ongoing HEP program and fitness. (TARGET DATE: 08/19/2016)   Baseline PT reset target date with delayed treatment with wound on limb / hospitalization   Time 10    Period Weeks   Status On-going     PT LONG TERM GOAL #2   Title Patient will score >44/56 on the Berg Balance test to indicate a decrease in his risk of falling. (TARGET DATE: 08/19/2016)   Baseline PT reset target date with delayed treatment with wound on limb /  hospitalization   Time 10   Period Weeks   Status On-going     PT LONG TERM GOAL #3   Title Patient will perform a TUG with prosthesis only in <13s to indicate a decrease in his risk of falling. (TARGET DATE: 08/19/2016)   Baseline PT reset target date with delayed treatment with wound on limb / hospitalization   Time 10   Period Weeks   Status On-going     PT LONG TERM GOAL #4   Title Patient will complete an Functional Gait Assessment with a score of >18 to indicate a decrease in his risk of falling. (TARGET DATE: 08/19/2016)   Baseline PT reset target date with delayed treatment with wound on limb / hospitalization   Time 10   Period Weeks   Status On-going     PT LONG TERM GOAL #5   Title Patient will ambulate 1000' on unlevel surfaces (outdoors, ramps, curbs, and grass) with LRAD and prosthesis. (TARGET DATE: 08/19/2016)   Baseline PT reset target date with delayed treatment with wound on limb / hospitalization   Time 10   Period Weeks   Status On-going     PT LONG TERM GOAL #6   Title Patient performs functional task of lifting, pushing, pulling with prosthesis safely.  (Target Date: 08/19/2016)   Time 10   Period Weeks   Status On-going               Plan - 07/06/16 1327    Clinical Impression Statement Today's skilled PT session focused on advancing patient's prosthetic gait training by working on high level balance activities including changing direction, changing speed, head motion, and side stepping. PT introduced standing hip exercises to foster LE strengthening and balance. PT set up an appointment for patient with prosthetist after today's appointment for prosthetist to add pads (as appropriate) due  to patient's report of wearing a 16-ply sock and still noticing motion of his residual limb in the socket in a standing, weight bearing position. Patient is making progress towards goals, and will benefit from continued skilled PT to address functional mobility deficits.    Rehab Potential Good   Clinical Impairments Affecting Rehab Potential DM type 1, charcot foot, HTN, HLD   PT Frequency 2x / week   PT Duration Other (comment)  10 weeks   PT Treatment/Interventions ADLs/Self Care Home Management;DME Instruction;Gait training;Stair training;Functional mobility training;Therapeutic activities;Therapeutic exercise;Balance training;Prosthetic Training;Patient/family education;Neuromuscular re-education;Scar mobilization   PT Next Visit Plan advance prosthetic gait training to outdoor surfaces including grassy inclines; STGs due 07/15/2016    Consulted and Agree with Plan of Care Patient      Patient will benefit from skilled therapeutic intervention in order to improve the following deficits and impairments:  Abnormal gait, Decreased activity tolerance, Decreased balance, Decreased knowledge of precautions, Decreased knowledge of use of DME, Decreased mobility, Difficulty walking, Decreased strength, Decreased skin integrity, Postural dysfunction, Pain  Visit Diagnosis: Other abnormalities of gait and mobility  Unsteadiness on feet  Muscle weakness (generalized)     Problem List Patient Active Problem List   Diagnosis Date Noted  . Chronic fatigue 07/04/2016  . Impaired exercise tolerance 07/04/2016  . Acute left otitis media 06/09/2016  . Hx of BKA, left - with subsequent cellulitis and treatment for osteomylitis 06/01/2016  . Abscess of left leg 04/26/2016  . Diabetes mellitus with complication (HCC)   . Normocytic anemia 01/01/2016  . Osteomyelitis (HCC) 12/31/2015  . Diabetic ulcer of left heel associated with  type 1 diabetes mellitus, with necrosis of bone (HCC) 12/31/2015  .  Ankle wound 12/31/2015  . Chronic osteomyelitis of left ankle with draining sinus (HCC)   . Type 1 diabetes, uncontrolled, with Charcot's joint of foot (HCC) 10/12/2015  . Low HDL (under 40) 09/22/2015  . Obesity, Class III, BMI 40-49.9 (morbid obesity) (HCC) 09/22/2015  . H/O noncompliance with medical treatment, presenting hazards to health 09/22/2015  . Hx MRSA infection 09/22/2015  . Vitamin D deficiency 09/22/2015  . Diabetes mellitus type 1 (HCC)   . Charcot's joint of foot 11/25/2013  . Essential hypertension 05/03/2013  . Hyperlipidemia 07/26/2012  . Diabetic ketoacidosis (HCC)   . GERD (gastroesophageal reflux disease)   . Meniscus tear   . Epigastric pain 12/10/2011  . Vomiting 12/10/2011  . Problems influencing health status 11/21/2011   Chase Picket, SPT 07/06/2016, 1:30 PM  Vladimir Faster, PT, DPT  07/06/2016, 9:10 PM  [facility] [address] [place], [place], [address] Phone: [phone]   Fax:  [phone]  Name: Michael Hutchinson MRN: [id number] Date of Birth: [birth year]

## 2016-07-11 ENCOUNTER — Encounter: Payer: BLUE CROSS/BLUE SHIELD | Admitting: Physical Therapy

## 2016-07-12 ENCOUNTER — Ambulatory Visit: Payer: BLUE CROSS/BLUE SHIELD | Attending: Orthopedic Surgery | Admitting: Physical Therapy

## 2016-07-12 ENCOUNTER — Encounter: Payer: Self-pay | Admitting: Physical Therapy

## 2016-07-12 DIAGNOSIS — R2681 Unsteadiness on feet: Secondary | ICD-10-CM

## 2016-07-12 DIAGNOSIS — R2689 Other abnormalities of gait and mobility: Secondary | ICD-10-CM | POA: Diagnosis not present

## 2016-07-12 DIAGNOSIS — M6281 Muscle weakness (generalized): Secondary | ICD-10-CM | POA: Diagnosis not present

## 2016-07-12 NOTE — Therapy (Signed)
[facility] [address] [place], [place], [address] Phone: [phone]   Fax:  [phone]  Physical Therapy Treatment  Patient Details  Name: Michael Hutchinson MRN: [id number] Date of Birth: 1975/11/13 Referring Provider: Dr. [name]  Encounter Date: 07/12/2016      PT End of Session - 07/12/16 1438    Visit Number 9   Number of Visits 21   Date for PT Re-Evaluation 08/19/16   Authorization Type [employer] 30 visit limit with 23 visits remaining   Authorization - Visit Number 9   Authorization - Number of Visits 23   PT Start Time 1316   PT Stop Time 1357   PT Time Calculation (min) 41 min   Equipment Utilized During Treatment Gait belt   Activity Tolerance Patient tolerated treatment well   Behavior During Therapy WFL for tasks assessed/performed      Past Medical History:  Diagnosis Date  . Charcot's joint of foot 11/25/2013  . Complication of anesthesia    "I wake up angry" (12/31/2015)  . Diabetes mellitus type 1 ([place]) dx'd 1981  . Diabetic ketoacidosis ([place])   . Essential hypertension 05/03/2013  . GERD (gastroesophageal reflux disease)   . High cholesterol   . Hx MRSA infection    Inner thigh and under arm- healed areas  . Meniscus tear   . Shortness of breath    with exertion only    Past Surgical History:  Procedure Laterality Date  . AMPUTATION Left 01/01/2016   Procedure: AMPUTATION BELOW KNEE;  Surgeon: [name], MD;  Location: [facility];  Service: Orthopedics;  Laterality: Left;  . APPLICATION OF WOUND VAC  04/26/2016  . FRACTURE SURGERY    . HERNIA REPAIR    . I&D EXTREMITY Left 04/26/2016   Procedure: IRRIGATION AND DEBRIDEMENT EXTREMITY/Left Leg/Possible Wound Vac;  Surgeon: [name], MD;  Location: [facility];  Service: Orthopedics;  Laterality: Left;  . INCISE AND DRAIN ABCESS Left 04/26/2016  . KNEE ARTHROSCOPY Left ~ 2010  . LAPAROSCOPIC CHOLECYSTECTOMY  2015  . METACARPOPHALANGEAL JOINT  ARTHRODESIS Left 06/2012   Fracture left index finger intra-articular MCP joint/notes 06/30/2012  . OPEN REDUCTION INTERNAL FIXATION (ORIF) PROXIMAL PHALANX Left 06/30/2012   Procedure: OPEN REDUCTION INTERNAL FIXATION (ORIF) LEFT INDEX FINGER PROXIMAL PHALANX FRACTURE WITH LIGAMENT REPAIR AS NECESSARY;  Surgeon: [name], MD;  Location: [facility];  Service: Orthopedics;  Laterality: Left;  . UMBILICAL HERNIA REPAIR  2015   "w/gallbladder OR"    There were no vitals filed for this visit.      Subjective Assessment - 07/12/16 1526    Subjective Patient reports no complaints, pain or falls. He reported he visited [facility] and said Richardson Landry recommended a socket revision. He is awaiting Gerald Stabs to return from vacation.          [facility] PT Assessment - 07/12/16 1324      Berg Balance Test   Sit to Stand Able to stand without using hands and stabilize independently   Standing Unsupported Able to stand safely 2 minutes   Sitting with Back Unsupported but Feet Supported on Floor or Stool Able to sit safely and securely 2 minutes   Stand to Sit Controls descent by using hands   Transfers Able to transfer safely, minor use of hands   Standing Unsupported with Eyes Closed Able to stand 10 seconds safely   Standing Ubsupported with Feet Together Able to place feet together independently and stand 1 minute safely  From Standing, Reach Forward with Outstretched Arm Can reach confidently >25 cm (10")   From Standing Position, Pick up Object from [place] to pick up shoe safely and easily   From Standing Position, Turn to Look Behind Over each Shoulder Looks behind from both sides and weight shifts well   Turn 360 Degrees Able to turn 360 degrees safely but slowly   Standing Unsupported, Alternately Place Feet on Step/Stool Able to stand independently and safely and complete 8 steps in 20 seconds   Standing Unsupported, One Foot in Front Able to plae foot ahead of the other independently and hold 30  seconds   Standing on One Leg Tries to lift leg/unable to hold 3 seconds but remains standing independently   Total Score 49   Berg comment: 49/56= mod risk for falls          [facility] Adult PT Treatment/Exercise - 07/12/16 1415      Transfers   Transfers Sit to Stand;Stand to Sit   Sit to Stand 6: Modified independent (Device/Increase time)   Stand to Sit 6: Modified independent (Device/Increase time)     Ambulation/Gait   Ambulation/Gait Yes   Ambulation/Gait Assistance 5: Supervision   Ambulation/Gait Assistance Details Needs cues to bear weight equally over LE's   Ambulation Distance (Feet) 1000 Feet   Assistive device Prosthesis   Gait Pattern Step-through pattern;Antalgic;Decreased stride length   Ambulation Surface Level;Indoor;Outdoor;Unlevel;Gravel;Grass  mulch     High Level Balance   High Level Balance Activities Head turns;Marching forwards   High Level Balance Comments Pt performed head turns while walking 50 feet without an AD x5 laps (R<>L, up<>down, diagonals both ways), small side step to R x2 with no significant LOB, pt denied dizziness throughout trials, supervision. He performed forward high knee marches with 3 second hold at counter with UE assist for balance x5 laps, supervision, VC for posture and weight shifting over supporting leg to cue less reliance on UE for support.     Prosthetics   Prosthetic Care Comments  Pt presents to PT with 13 ply socks, has had pads added to socket. He said he has been wearing 16 ply socks most of the time.    Current prosthetic wear tolerance (days/week)  daily   Current prosthetic wear tolerance (#hours/day)  all awake hours with breaks to dry through out day   Residual limb condition  small blistered area on incision site covered with Tegaderm           PT Short Term Goals - 07/12/16 1441      PT SHORT TERM GOAL #1   Title Patient wil verbalize understanding and adhere to proper prosthesis wear schedule and prosthesis  care. (TARGET DATE: 07/15/2016)   Baseline PT reset target date with delayed treatment with wound on limb / hospitalization   Time 5   Period Weeks   Status Achieved     PT SHORT TERM GOAL #2   Title Patient will verbalize understanding and demonstrate HEP activities and exercises. (TARGET DATE: 07/15/2016)   Baseline PT reset target date with delayed treatment with wound on limb / hospitalization   Time 5   Period Weeks   Status Achieved     PT SHORT TERM GOAL #3   Title Patient will score >32/56 on Berg Balance to indicate a decrease in his risk of falling. (TARGET DATE: 07/15/2016)   Baseline 07/12/16: 49/56, indicating a moderate risk for falls   Time 5   Period Weeks  Status Achieved     PT SHORT TERM GOAL #4   Title Patient will demonstrate ability to ambulate on level, indoor surfaces for 250' with prosthesis and LRAD with supervision. (TARGET DATE: 07/15/2016)   Baseline 1000 feet on level, unlevel, indoor and outdoor surfaces for 1000 feet with prosthesis and no AD with supervision on 07/12/16   Time 5   Period Weeks   Status Achieved           PT Long Term Goals - 07/12/16 1443      PT LONG TERM GOAL #1   Title Patient will verbalize and demonstrate understanding of his ongoing HEP program and fitness. (TARGET DATE: 08/19/2016)   Baseline PT reset target date with delayed treatment with wound on limb / hospitalization   Time 10   Period Weeks   Status On-going     PT LONG TERM GOAL #2   Title Patient will score >44/56 on the Berg Balance test to indicate a decrease in his risk of falling. (TARGET DATE: 08/19/2016)   Baseline 07/12/16: 49/56, indicating a moderate risk for falls7/3/18: 49/56, indicating a moderate risk for falls   Time 10   Period Weeks   Status Achieved     PT LONG TERM GOAL #3   Title Patient will perform a TUG with prosthesis only in <13s to indicate a decrease in his risk of falling. (TARGET DATE: 08/19/2016)   Baseline PT reset target date with delayed  treatment with wound on limb / hospitalization   Time 10   Period Weeks   Status On-going     PT LONG TERM GOAL #4   Title Patient will complete an Functional Gait Assessment with a score of >18 to indicate a decrease in his risk of falling. (TARGET DATE: 08/19/2016)   Baseline PT reset target date with delayed treatment with wound on limb / hospitalization   Time 10   Period Weeks   Status On-going     PT LONG TERM GOAL #5   Title Patient will ambulate 1000' on unlevel surfaces (outdoors, ramps, curbs, and grass) with LRAD and prosthesis. (TARGET DATE: 08/19/2016)   Baseline PT reset target date with delayed treatment with wound on limb / hospitalization   Time 10   Period Weeks   Status Partially Met     PT LONG TERM GOAL #6   Title Patient performs functional task of lifting, pushing, pulling with prosthesis safely.  (Target Date: 08/19/2016)   Time 10   Period Weeks   Status On-going               Plan - 07/12/16 1447    Clinical Impression Statement Today's skilled session focused on assessment of progress on STG's and performance of dynamic balance activities. Patient is progressing well towards his LTG's as he met all STG's and one LTG today. His improved score of 49/56 on the Berg Balance test today indicates a moderate risk for falls in comparison to his initial score 26/56 on 04/20/16 which indicated a high risk for falls. This marked improvement met both his short term and long term goal for this balance assessment. In addition to meeting his STG ambulating over level surfaces, he made significant progress towards LTG 5 which includes additional surfaces, curb, and ramps. Today he successfully ambulated over unlevel surfaces outdoors including across mulch and gravel as well as up and down grassy surfaces without an AD for a sum of approximately 1000 feet. Patient also demonstrated improved dynamic balance  walking with head turns with no notable LOB. SLS activities remain a  challenge. Patient will benefit from continued PT to progress towards unmet goals.   Rehab Potential Good   Clinical Impairments Affecting Rehab Potential DM type 1, charcot foot, HTN, HLD   PT Frequency 2x / week   PT Duration Other (comment)   PT Treatment/Interventions ADLs/Self Care Home Management;DME Instruction;Gait training;Stair training;Functional mobility training;Therapeutic activities;Therapeutic exercise;Balance training;Prosthetic Training;Patient/family education;Neuromuscular re-education;Scar mobilization   PT Next Visit Plan Perform FGA, focus on high level balance, SLS, uneven surfaces indoors/outdoors, curbs and inclines.      Patient will benefit from skilled therapeutic intervention in order to improve the following deficits and impairments:  Abnormal gait, Decreased activity tolerance, Decreased balance, Decreased knowledge of precautions, Decreased knowledge of use of DME, Decreased mobility, Difficulty walking, Decreased strength, Decreased skin integrity, Postural dysfunction, Pain  Visit Diagnosis: Other abnormalities of gait and mobility  Unsteadiness on feet  Muscle weakness (generalized)     Problem List Patient Active Problem List   Diagnosis Date Noted  . Chronic fatigue 07/04/2016  . Impaired exercise tolerance 07/04/2016  . Acute left otitis media 06/09/2016  . Hx of BKA, left - with subsequent cellulitis and treatment for osteomylitis 06/01/2016  . Abscess of left leg 04/26/2016  . Diabetes mellitus with complication ([place])   . Normocytic anemia 01/01/2016  . Osteomyelitis ([place]) 12/31/2015  . Diabetic ulcer of left heel associated with type 1 diabetes mellitus, with necrosis of bone ([place]) 12/31/2015  . Ankle wound 12/31/2015  . Chronic osteomyelitis of left ankle with draining sinus ([place])   . Type 1 diabetes, uncontrolled, with Charcot's joint of foot ([place]) 10/12/2015  . Low HDL (under 40) 09/22/2015  . Obesity, Class III, BMI 40-49.9 (morbid  obesity) ([place]) 09/22/2015  . H/O noncompliance with medical treatment, presenting hazards to health 09/22/2015  . Hx MRSA infection 09/22/2015  . Vitamin D deficiency 09/22/2015  . Diabetes mellitus type 1 ([place])   . Charcot's joint of foot 11/25/2013  . Essential hypertension 05/03/2013  . Hyperlipidemia 07/26/2012  . Diabetic ketoacidosis ([place])   . GERD (gastroesophageal reflux disease)   . Meniscus tear   . Epigastric pain 12/10/2011  . Vomiting 12/10/2011  . Problems influencing health status 11/21/2011    Rulon Eisenmenger, SPTA 07/12/2016, 3:37 PM  [facility] [address] [place], [place], [address] Phone: [phone]   Fax:  [phone]  Name: BRODYN DEPUY MRN: [id number] Date of Birth: 04-10-1975

## 2016-07-14 ENCOUNTER — Encounter: Payer: Self-pay | Admitting: Rehabilitation

## 2016-07-14 ENCOUNTER — Ambulatory Visit: Payer: BLUE CROSS/BLUE SHIELD | Admitting: Rehabilitation

## 2016-07-14 DIAGNOSIS — M6281 Muscle weakness (generalized): Secondary | ICD-10-CM | POA: Diagnosis not present

## 2016-07-14 DIAGNOSIS — R2689 Other abnormalities of gait and mobility: Secondary | ICD-10-CM

## 2016-07-14 DIAGNOSIS — R2681 Unsteadiness on feet: Secondary | ICD-10-CM

## 2016-07-14 NOTE — Therapy (Signed)
[facility] [address] [place], [place], [address] Phone: [phone]   Fax:  [phone]  Physical Therapy Treatment  Patient Details  Name: Michael Hutchinson MRN: [id number] Date of Birth: [birth year] Referring Provider: Dr. [name]  Encounter Date: 07/14/2016      PT End of Session - 07/14/16 1548    Visit Number 10   Number of Visits 21   Date for PT Re-Evaluation 08/19/16   Authorization Type [employer] 30 visit limit with 23 visits remaining   Authorization - Visit Number 10   Authorization - Number of Visits 23   PT Start Time 1450  patient arrived late    PT Stop Time 1530   PT Time Calculation (min) 40 min   Activity Tolerance Patient tolerated treatment well   Behavior During Therapy [facility] for tasks assessed/performed      Past Medical History:  Diagnosis Date  . Charcot's joint of foot 11/25/2013  . Complication of anesthesia    "I wake up angry" (12/31/2015)  . Diabetes mellitus type 1 ([place]) dx'd 1981  . Diabetic ketoacidosis ([place])   . Essential hypertension 05/03/2013  . GERD (gastroesophageal reflux disease)   . High cholesterol   . Hx MRSA infection    Inner thigh and under arm- healed areas  . Meniscus tear   . Shortness of breath    with exertion only    Past Surgical History:  Procedure Laterality Date  . AMPUTATION Left 01/01/2016   Procedure: AMPUTATION BELOW KNEE;  Surgeon: [name], MD;  Location: [place];  Service: Orthopedics;  Laterality: Left;  . APPLICATION OF WOUND VAC  04/26/2016  . FRACTURE SURGERY    . HERNIA REPAIR    . I&D EXTREMITY Left 04/26/2016   Procedure: IRRIGATION AND DEBRIDEMENT EXTREMITY/Left Leg/Possible Wound Vac;  Surgeon: [name], MD;  Location: [place];  Service: Orthopedics;  Laterality: Left;  . INCISE AND DRAIN ABCESS Left 04/26/2016  . KNEE ARTHROSCOPY Left ~ 2010  . LAPAROSCOPIC CHOLECYSTECTOMY  2015  . METACARPOPHALANGEAL JOINT ARTHRODESIS Left  06/2012   Fracture left index finger intra-articular MCP joint/notes 06/30/2012  . OPEN REDUCTION INTERNAL FIXATION (ORIF) PROXIMAL PHALANX Left 06/30/2012   Procedure: OPEN REDUCTION INTERNAL FIXATION (ORIF) LEFT INDEX FINGER PROXIMAL PHALANX FRACTURE WITH LIGAMENT REPAIR AS NECESSARY;  Surgeon: [name], MD;  Location: [facility];  Service: Orthopedics;  Laterality: Left;  . UMBILICAL HERNIA REPAIR  2015   "w/gallbladder OR"    There were no vitals filed for this visit.      Subjective Assessment - 07/14/16 1452    Subjective Patient denies any changes or falls. 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    Currently in Pain? No/denies            [facility] PT Assessment - 07/14/16 1445      High Level Balance   High Level Balance Activities --   High Level Balance Comments --     Functional Gait  Assessment   Gait assessed  Yes   Gait Level Surface Walks 20 ft in less than 5.5 sec, no assistive devices, good speed, no evidence for imbalance, normal gait pattern, deviates no more than 6 in outside of the 12 in walkway width.   Change in Gait Speed Able to smoothly change walking speed without loss of balance or gait deviation. Deviate no more than 6 in outside of the 12 in walkway  width.   Gait with Horizontal Head Turns Performs head turns smoothly with no change in gait. Deviates no more than 6 in outside 12 in walkway width   Gait with Vertical Head Turns Performs head turns with no change in gait. Deviates no more than 6 in outside 12 in walkway width.   Gait and Pivot Turn Pivot turns safely within 3 sec and stops quickly with no loss of balance.   Step Over Obstacle Is able to step over one shoe box (4.5 in total height) without changing gait speed. No evidence of imbalance.   Gait with Narrow Base of Support Ambulates less than 4 steps heel to toe or cannot perform without assistance.   Gait with Eyes Closed Walks 20 ft, uses  assistive device, slower speed, mild gait deviations, deviates 6-10 in outside 12 in walkway width. Ambulates 20 ft in less than 9 sec but greater than 7 sec.   Ambulating Backwards Walks 20 ft, uses assistive device, slower speed, mild gait deviations, deviates 6-10 in outside 12 in walkway width.   Steps Alternating feet, must use rail.   Total Score 23                     [facility] Adult PT Treatment/Exercise - 07/14/16 1445      Transfers   Transfers Sit to Stand;Stand to Sit   Sit to Stand 6: Modified independent (Device/Increase time)   Stand to Sit 6: Modified independent (Device/Increase time)     Ambulation/Gait   Ambulation/Gait Yes   Ambulation/Gait Assistance 5: Supervision   Ambulation Distance (Feet) 364 Feet  347f; multiple short distances ambulating around clinic    Assistive device Prosthesis   Gait Pattern Step-through pattern;Antalgic;Decreased stride length   Ambulation Surface Level;Indoor   Gait velocity 3.965fs   Stairs Yes   Stairs Assistance 5: Supervision   Stair Management Technique One rail Left;Alternating pattern;Forwards   Number of Stairs 4   Ramp 5: Supervision;Other (comment)  min guard    Ramp Details (indicate cue type and reason) Requires close S - min guard due to misstep when wearing the "weighted vest" (26#) to simulate fireman gear   Curb Other (comment);4: Min assist  min guard   Curb Details (indicate cue type and reason) Requires min guard when stepping down off a curb, and min guard - min A when stepping up onto a curb due to posterior lean of trunk due to not properly compensating for wearing th eweighted vest (26#).    Gait Comments PT instructed patient in donning a weighted vest (created vest with body weight support treadmill vest + cuff weights) and ambulating on level surfaces/ramp/curb. Requires close S with intermittent min A on barriers due to misstep and imbalance due to posterior trunk lean due to the weight of the  vest.     Therapeutic Activites    Therapeutic Activities Work Simulation   Work Simulation Patient lifted 32# box without the weighted vest donned. Required demo and cueing for foot placement. Progressed and donned "weighted vest/backpack" (26#), and patient pulled a a weighted box (75#) to simulate work activities requiring supervision from PT for safety.     Neuro Re-ed    Neuro Re-ed Details  In parallel bars: performed static standing (feet shoulder width apart) + EO + head turns/nods requiring intermittent min guard from PT and intermittent BUE support on parallel bars to maintain balance.      Prosthetics   Prosthetic Care Comments  Patient  presents to PT with 16-ply sock donned even with pads placed in socket.    Current prosthetic wear tolerance (days/week)  daily   Current prosthetic wear tolerance (#hours/day)  all awake hours with breaks to dry through out day   Residual limb condition  Tegaderm placed over small blister on incision site. Patient reports he is changing Tegaderm every day without issues.                PT Education - 07/14/16 1548    Education provided Yes   Education Details bring fire gear to practice maintaining balance and ambulating with equipment    Person(s) Educated Patient;Spouse   Methods Explanation   Comprehension Verbalized understanding          PT Short Term Goals - 07/12/16 1441      PT SHORT TERM GOAL #1   Title Patient wil verbalize understanding and adhere to proper prosthesis wear schedule and prosthesis care. (TARGET DATE: 07/15/2016)   Baseline PT reset target date with delayed treatment with wound on limb / hospitalization   Time 5   Period Weeks   Status Achieved     PT SHORT TERM GOAL #2   Title Patient will verbalize understanding and demonstrate HEP activities and exercises. (TARGET DATE: 07/15/2016)   Baseline PT reset target date with delayed treatment with wound on limb / hospitalization   Time 5   Period Weeks    Status Achieved     PT SHORT TERM GOAL #3   Title Patient will score >32/56 on Berg Balance to indicate a decrease in his risk of falling. (TARGET DATE: 07/15/2016)   Baseline 07/12/16: 49/56, indicating a moderate risk for falls   Time 5   Period Weeks   Status Achieved     PT SHORT TERM GOAL #4   Title Patient will demonstrate ability to ambulate on level, indoor surfaces for 250' with prosthesis and LRAD with supervision. (TARGET DATE: 07/15/2016)   Baseline 1000 feet on level, unlevel, indoor and outdoor surfaces for 1000 feet with prosthesis and no AD with supervision on 07/12/16   Time 5   Period Weeks   Status Achieved           PT Long Term Goals - 07/14/16 1554      PT LONG TERM GOAL #1   Title Patient will verbalize and demonstrate understanding of his ongoing HEP program and fitness. (TARGET DATE: 08/19/2016)   Baseline PT reset target date with delayed treatment with wound on limb / hospitalization   Time 10   Period Weeks   Status On-going     PT LONG TERM GOAL #2   Title Patient will score >44/56 on the Berg Balance test to indicate a decrease in his risk of falling. (TARGET DATE: 08/19/2016)   Baseline 07/12/16: 49/56, indicating a moderate risk for falls7/3/18: 49/56, indicating a moderate risk for falls   Time 10   Period Weeks   Status Achieved     PT LONG TERM GOAL #3   Title Patient will perform a TUG with prosthesis only in <13s to indicate a decrease in his risk of falling. (TARGET DATE: 08/19/2016)   Baseline PT reset target date with delayed treatment with wound on limb / hospitalization   Time 10   Period Weeks   Status On-going     PT LONG TERM GOAL #4   Title Patient will complete an Functional Gait Assessment with a score of >25 to indicate a decrease in  his risk of falling. (TARGET DATE: 08/19/2016)   Baseline 07/14/2016 FGA score = 23/30    Time 10   Period Weeks   Status Revised     PT LONG TERM GOAL #5   Title Patient will ambulate 1000' on unlevel  surfaces (outdoors, ramps, curbs, and grass) with LRAD and prosthesis. (TARGET DATE: 08/19/2016)   Baseline PT reset target date with delayed treatment with wound on limb / hospitalization   Time 10   Period Weeks   Status Partially Met     PT LONG TERM GOAL #6   Title Patient performs functional task of lifting, pushing, pulling with prosthesis safely.  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 PT notes patient has decreased prosthesis abduction and increased lateral sway when the weighted vest is donned. PT introduced balance activities on compliant surfaces, and plans to continue these activities in future sessions. Performed the Functional Gait Assessment, and PT has updated long term goal based on today's performance. Patient denied any increase in pain throughout today's session. Patient is making progress towards goals, and will benefit from continued skilled PT to address functional mobility deficits.     Rehab Potential Good   Clinical Impairments Affecting Rehab Potential DM type 1, charcot foot, HTN, HLD   PT Frequency 2x / week   PT Duration Other (comment)   PT Treatment/Interventions ADLs/Self Care Home Management;DME Instruction;Gait training;Stair training;Functional mobility training;Therapeutic activities;Therapeutic exercise;Balance training;Prosthetic Training;Patient/family education;Neuromuscular re-education;Scar mobilization   PT Next Visit Plan progress compliant surface balance activities (airdex, blue foam strip) with increased head turns/nods/diagonals and EC; progress work simulation utilizing  gear/equipment patient brings to PT    Consulted and Agree with Plan of Care Patient;Family member/caregiver      Patient will benefit from skilled therapeutic intervention in order to improve the following deficits and impairments:  Abnormal gait, Decreased activity tolerance, Decreased balance, Decreased knowledge of precautions, Decreased knowledge of use of DME, Decreased mobility, Difficulty walking, Decreased strength, Decreased skin integrity, Postural dysfunction, Pain  Visit Diagnosis: Other abnormalities of gait and mobility  Unsteadiness on feet  Muscle weakness (generalized)     Problem List Patient Active Problem List   Diagnosis Date Noted  . Chronic fatigue 07/04/2016  . Impaired exercise tolerance 07/04/2016  . Acute left otitis media 06/09/2016  . Hx of BKA, left - with subsequent cellulitis and treatment for osteomylitis 06/01/2016  . Abscess of left leg 04/26/2016  . Diabetes mellitus with complication ([place])   . Normocytic anemia 01/01/2016  . Osteomyelitis ([place]) 12/31/2015  . Diabetic ulcer of left heel associated with type 1 diabetes mellitus, with necrosis of bone ([place]) 12/31/2015  . Ankle wound 12/31/2015  . Chronic osteomyelitis of left ankle with draining sinus ([place])   . Type 1 diabetes, uncontrolled, with Charcot's joint of foot ([place]) 10/12/2015  . Low HDL (under 40) 09/22/2015  . Obesity, Class III, BMI 40-49.9 (morbid obesity) ([place]) 09/22/2015  . H/O noncompliance with medical treatment, presenting hazards to health 09/22/2015  . Hx MRSA infection 09/22/2015  . Vitamin D deficiency 09/22/2015  . Diabetes mellitus  type 1 ([place])   . Charcot's joint of foot 11/25/2013  . Essential hypertension 05/03/2013  . Hyperlipidemia 07/26/2012  . Diabetic ketoacidosis ([place])   . GERD (gastroesophageal reflux disease)   . Meniscus tear   . Epigastric pain 12/10/2011  . Vomiting 12/10/2011  . Problems influencing health status 11/21/2011    Arelia Sneddon,  SPT  07/14/2016, 4:11 PM  [facility] [address] [place], [place], [address] Phone: [phone]   Fax:  [phone]  Name: Michael Hutchinson MRN: [id number] Date of Birth: 1975-06-10

## 2016-07-18 ENCOUNTER — Ambulatory Visit: Payer: BLUE CROSS/BLUE SHIELD | Admitting: Physical Therapy

## 2016-07-18 DIAGNOSIS — R2689 Other abnormalities of gait and mobility: Secondary | ICD-10-CM

## 2016-07-18 DIAGNOSIS — M6281 Muscle weakness (generalized): Secondary | ICD-10-CM

## 2016-07-18 DIAGNOSIS — R2681 Unsteadiness on feet: Secondary | ICD-10-CM | POA: Diagnosis not present

## 2016-07-18 NOTE — Therapy (Signed)
[facility] [address] [place], [place], [address] Phone: [phone]   Fax:  [phone]  Physical Therapy Treatment  Patient Details  Name: Michael Hutchinson MRN: [id number] Date of Birth: 06/17/1975 Referring Provider: Dr. [name]  Encounter Date: 07/18/2016      PT End of Session - 07/18/16 1544    Visit Number 11   Number of Visits 21   Date for PT Re-Evaluation 08/19/16   Authorization Type [employer] 30 visit limit with 23 visits remaining   Authorization - Visit Number 11   Authorization - Number of Visits 23   PT Start Time 1450   PT Stop Time 1529   PT Time Calculation (min) 39 min   Equipment Utilized During Treatment Gait belt   Activity Tolerance Patient tolerated treatment well   Behavior During Therapy WFL for tasks assessed/performed      Past Medical History:  Diagnosis Date  . Charcot's joint of foot 11/25/2013  . Complication of anesthesia    "I wake up angry" (12/31/2015)  . Diabetes mellitus type 1 ([place]) dx'd 1981  . Diabetic ketoacidosis ([place])   . Essential hypertension 05/03/2013  . GERD (gastroesophageal reflux disease)   . High cholesterol   . Hx MRSA infection    Inner thigh and under arm- healed areas  . Meniscus tear   . Shortness of breath    with exertion only    Past Surgical History:  Procedure Laterality Date  . AMPUTATION Left 01/01/2016   Procedure: AMPUTATION BELOW KNEE;  Surgeon: [name], MD;  Location: [facility];  Service: Orthopedics;  Laterality: Left;  . APPLICATION OF WOUND VAC  04/26/2016  . FRACTURE SURGERY    . HERNIA REPAIR    . I&D EXTREMITY Left 04/26/2016   Procedure: IRRIGATION AND DEBRIDEMENT EXTREMITY/Left Leg/Possible Wound Vac;  Surgeon: [name], MD;  Location: [facility];  Service: Orthopedics;  Laterality: Left;  . INCISE AND DRAIN ABCESS Left 04/26/2016  . KNEE ARTHROSCOPY Left ~ 2010  . LAPAROSCOPIC CHOLECYSTECTOMY  2015  . METACARPOPHALANGEAL  JOINT ARTHRODESIS Left 06/2012   Fracture left index finger intra-articular MCP joint/notes 06/30/2012  . OPEN REDUCTION INTERNAL FIXATION (ORIF) PROXIMAL PHALANX Left 06/30/2012   Procedure: OPEN REDUCTION INTERNAL FIXATION (ORIF) LEFT INDEX FINGER PROXIMAL PHALANX FRACTURE WITH LIGAMENT REPAIR AS NECESSARY;  Surgeon: [name], MD;  Location: [facility];  Service: Orthopedics;  Laterality: Left;  . UMBILICAL HERNIA REPAIR  2015   "w/gallbladder OR"    There were no vitals filed for this visit.      Subjective Assessment - 07/18/16 1615    Subjective Patient reports no changes or falls since last tx. He reported he has an appointment with Gerald Stabs tomorrow to see about a socket revision. He said one time this weekend he kept his prosthesis on for much longer than normal and didn't take it off to wipe sweat off his residual limb when he felt his residual limb lose suction in the socket.             Prosthetics Assessment - 07/18/16 1537      Prosthetics   Prosthetic Care Comments  Patient presents to PT with 18 ply socks donned with pads placed in socket   Current prosthetic wear tolerance (days/week)  daily   Current prosthetic wear tolerance (#hours/day)  all awake hours with breaks to dry through out day           [facility] Adult PT Treatment/Exercise -  07/18/16 1537      Transfers   Transfers Sit to Stand;Stand to Sit   Sit to Stand 6: Modified independent (Device/Increase time)   Stand to Sit 6: Modified independent (Device/Increase time)     Neuro Re-ed    Neuro Re-ed Details  Small rockerboard: EO, 20 seconds x2, no UE support, head turns R<>L x10, up<>down x10, diagonals both directions x10 each, min guard, verbal cues for weight shifting.     Prosthetics   Residual limb condition  Tegaderm is placed over healed previously blistered area     Level surface: --SLS with 5 second hold R<>L x10 each, unilateral UE support progressing to B UE support as patient fatigued.   --SLS at counter for unilateral UE support, alternating cone taps x10 each, min guard, VC's for weight shifting, noted increased independence with weight shifting as task progressed.  Compliant surface: blue foam beam: --EO static hold 20 seconds x2, intermittent unilateral UE support needed, min guard; EC static hold 10 seconds x6, unilateral UE support required, VC for weight shifting, min guard --alternating heel taps anterior to beam x10 each; VC for posture to avoid posterior trunk lean; alternating toe taps posterior to beam x10 each; VC for posture, avoid anterior trunk lean.   On red mats at counter: --High knee marches forward and backwards with 2 second hold x 4 laps, intermittent unilateral UE support for forward marches and consistent UE support required for backward marches, min guard, VC for weight shifting, and to slow pace. --Tandem walking forward and backward x2 laps, min guard, VC for posture.         PT Short Term Goals - 07/12/16 1441      PT SHORT TERM GOAL #1   Title Patient wil verbalize understanding and adhere to proper prosthesis wear schedule and prosthesis care. (TARGET DATE: 07/15/2016)   Baseline PT reset target date with delayed treatment with wound on limb / hospitalization   Time 5   Period Weeks   Status Achieved     PT SHORT TERM GOAL #2   Title Patient will verbalize understanding and demonstrate HEP activities and exercises. (TARGET DATE: 07/15/2016)   Baseline PT reset target date with delayed treatment with wound on limb / hospitalization   Time 5   Period Weeks   Status Achieved     PT SHORT TERM GOAL #3   Title Patient will score >32/56 on Berg Balance to indicate a decrease in his risk of falling. (TARGET DATE: 07/15/2016)   Baseline 07/12/16: 49/56, indicating a moderate risk for falls   Time 5   Period Weeks   Status Achieved     PT SHORT TERM GOAL #4   Title Patient will demonstrate ability to ambulate on level, indoor surfaces for 250'  with prosthesis and LRAD with supervision. (TARGET DATE: 07/15/2016)   Baseline 1000 feet on level, unlevel, indoor and outdoor surfaces for 1000 feet with prosthesis and no AD with supervision on 07/12/16   Time 5   Period Weeks   Status Achieved           PT Long Term Goals - 07/14/16 1554      PT LONG TERM GOAL #1   Title Patient will verbalize and demonstrate understanding of his ongoing HEP program and fitness. (TARGET DATE: 08/19/2016)   Baseline PT reset target date with delayed treatment with wound on limb / hospitalization   Time 10   Period Weeks   Status On-going  PT LONG TERM GOAL #2   Title Patient will score >44/56 on the Berg Balance test to indicate a decrease in his risk of falling. (TARGET DATE: 08/19/2016)   Baseline 07/12/16: 49/56, indicating a moderate risk for falls7/3/18: 49/56, indicating a moderate risk for falls   Time 10   Period Weeks   Status Achieved     PT LONG TERM GOAL #3   Title Patient will perform a TUG with prosthesis only in <13s to indicate a decrease in his risk of falling. (TARGET DATE: 08/19/2016)   Baseline PT reset target date with delayed treatment with wound on limb / hospitalization   Time 10   Period Weeks   Status On-going     PT LONG TERM GOAL #4   Title Patient will complete an Functional Gait Assessment with a score of >25 to indicate a decrease in his risk of falling. (TARGET DATE: 08/19/2016)   Baseline 07/14/2016 FGA score = 23/30    Time 10   Period Weeks   Status Revised     PT LONG TERM GOAL #5   Title Patient will ambulate 1000' on unlevel surfaces (outdoors, ramps, curbs, and grass) with LRAD and prosthesis. (TARGET DATE: 08/19/2016)   Baseline PT reset target date with delayed treatment with wound on limb / hospitalization   Time 10   Period Weeks   Status Partially Met     PT LONG TERM GOAL #6   Title Patient performs functional task of lifting, pushing, pulling with prosthesis safely.  (Target Date: 08/19/2016)    Time 10   Period Weeks   Status On-going               Plan - 07/18/16 1544    Clinical Impression Statement Today's skilled session focused on high level balance activities performed on compliant surfaces. Patient demonstrated improved independence weight shifting as each balance task progressed as seen in a decreased amount of UE support required as well as in his posture. The blue foam beam offered greater balance challenge than the small rockerboard. Patient is continuing to make steady progress towards LTG's and will benefit from continued PT to meet unmet goals.   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   Consulted and Agree with Plan of Care Patient      Patient will benefit from skilled therapeutic intervention in order to improve the following deficits and impairments:  Abnormal gait, Decreased activity tolerance, Decreased balance, Decreased knowledge of precautions, Decreased knowledge of use of DME, Decreased mobility, Difficulty walking, Decreased strength, Decreased skin integrity, Postural dysfunction, Pain  Visit Diagnosis: Other abnormalities of gait and mobility  Unsteadiness on feet  Muscle weakness (generalized)     Problem List Patient Active Problem List   Diagnosis Date Noted  . Chronic fatigue 07/04/2016  . Impaired exercise tolerance 07/04/2016  . Acute left otitis media 06/09/2016  . Hx of BKA, left - with  subsequent cellulitis and treatment for osteomylitis 06/01/2016  . Abscess of left leg 04/26/2016  . Diabetes mellitus with complication ([facility])   . Normocytic anemia 01/01/2016  . Osteomyelitis ([place]) 12/31/2015  . 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 Ankle wound 12/31/2015  . Chronic osteomyelitis of left ankle with draining sinus ([place] [place])   . Type 1 diabetes, uncontrolled, with Charcot's joint of foot ([facility]) 10/12/2015  . Low HDL (under 40) 09/22/2015  . Obesity, Class III, BMI 40-49.9 (morbid obesity) ([facility]) 09/22/2015  . H/O noncompliance with medical treatment, presenting hazards to health 09/22/2015  . Hx MRSA infection 09/22/2015  . Vitamin D deficiency 09/22/2015  . Diabetes mellitus type 1 ([place])   . Charcot's joint of foot 11/25/2013  . Essential hypertension 05/03/2013  . Hyperlipidemia 07/26/2012  . Diabetic ketoacidosis ([place])   . GERD (gastroesophageal reflux disease)   . Meniscus tear   . Epigastric pain 12/10/2011  . Vomiting 12/10/2011  . Problems influencing health status 11/21/2011    Vaughan Sine 07/18/2016, 4:28 PM  [facility] [address] [place], [place], [address] Phone: [phone]   Fax:  [phone]  Name: Michael Hutchinson MRN: [id number] Date of Birth: [birth year]

## 2016-07-20 ENCOUNTER — Ambulatory Visit: Payer: BLUE CROSS/BLUE SHIELD | Admitting: Physical Therapy

## 2016-07-20 ENCOUNTER — Encounter: Payer: Self-pay | Admitting: Physical Therapy

## 2016-07-20 DIAGNOSIS — R2681 Unsteadiness on feet: Secondary | ICD-10-CM | POA: Diagnosis not present

## 2016-07-20 DIAGNOSIS — R2689 Other abnormalities of gait and mobility: Secondary | ICD-10-CM | POA: Diagnosis not present

## 2016-07-20 DIAGNOSIS — M6281 Muscle weakness (generalized): Secondary | ICD-10-CM | POA: Diagnosis not present

## 2016-07-21 NOTE — Therapy (Signed)
[facility] [address] [place], [place], [address] Phone: [phone]   Fax:  [phone]  Physical Therapy Treatment  Patient Details  Name: Michael Hutchinson MRN: [id number] Date of Birth: 1975-10-29 Referring Provider: Dr. [name]  Encounter Date: 07/20/2016      PT End of Session - 07/20/16 1539    Visit Number 12   Number of Visits 21   Date for PT Re-Evaluation 08/19/16   Authorization Type [employer] 30 visit limit with 23 visits remaining   Authorization - Visit Number 12   Authorization - Number of Visits 23   PT Start Time 9379   PT Stop Time 1614   PT Time Calculation (min) 41 min   Equipment Utilized During Treatment Gait belt   Activity Tolerance Patient tolerated treatment well   Behavior During Therapy WFL for tasks assessed/performed      Past Medical History:  Diagnosis Date  . Charcot's joint of foot 11/25/2013  . Complication of anesthesia    "I wake up angry" (12/31/2015)  . Diabetes mellitus type 1 ([facility]) dx'd 1981  . Diabetic ketoacidosis ([facility])   . Essential hypertension 05/03/2013  . GERD (gastroesophageal reflux disease)   . High cholesterol   . Hx MRSA infection    Inner thigh and under arm- healed areas  . Meniscus tear   . Shortness of breath    with exertion only    Past Surgical History:  Procedure Laterality Date  . AMPUTATION Left 01/01/2016   Procedure: AMPUTATION BELOW KNEE;  Surgeon: [name], MD;  Location: [facility];  Service: Orthopedics;  Laterality: Left;  . APPLICATION OF WOUND VAC  04/26/2016  . FRACTURE SURGERY    . HERNIA REPAIR    . I&D EXTREMITY Left 04/26/2016   Procedure: IRRIGATION AND DEBRIDEMENT EXTREMITY/Left Leg/Possible Wound Vac;  Surgeon: [name], MD;  Location: [facility];  Service: Orthopedics;  Laterality: Left;  . INCISE AND DRAIN ABCESS Left 04/26/2016  . KNEE ARTHROSCOPY Left ~ 2010  . LAPAROSCOPIC CHOLECYSTECTOMY  2015  . METACARPOPHALANGEAL  JOINT ARTHRODESIS Left 06/2012   Fracture left index finger intra-articular MCP joint/notes 06/30/2012  . OPEN REDUCTION INTERNAL FIXATION (ORIF) PROXIMAL PHALANX Left 06/30/2012   Procedure: OPEN REDUCTION INTERNAL FIXATION (ORIF) LEFT INDEX FINGER PROXIMAL PHALANX FRACTURE WITH LIGAMENT REPAIR AS NECESSARY;  Surgeon: [name], MD;  Location: [facility];  Service: Orthopedics;  Laterality: Left;  . UMBILICAL HERNIA REPAIR  2015   "w/gallbladder OR"    There were no vitals filed for this visit.      Subjective Assessment - 07/20/16 1537    Subjective Reports stumbling at work today after tripping over right foot and now with left limb soreness at tibial crest area/ bottom of limb from landing hard on limb to catch his balance.    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 500 feet total with weaving around furniture for ~50 feet. Climbed up/down 2 rungs of ladder with PTA counter weighting the opposite side to prevent ladder from tipping.  Outside  with weighted pack, jacket and helmet over paved surfaces and grass, including grassy hills. Min guard assist with one episode of balance loss as he tripped over water sprinkler in ground. Self-recovered with stepping strategy and min guard assist.  Neuro re-ed:  On balance board both ways: rocking board with eyes open with emphasis on tall posture/weight shifting x 10 reps with min guard assist and intermittent UE support/touch to bars. Holding board steady: EC no head movements, progressing to EC with head movements left<>right  and up<>down x 10 reps each. Min to mod assist needed for balance with cues on posture and weight shifting to assist with balance.        [facility] Adult PT Treatment/Exercise - 07/20/16 1615      Transfers   Transfers Sit to Stand;Stand to Sit   Sit to Stand 6: Modified independent (Device/Increase time)   Stand to Sit 6: Modified independent (Device/Increase time)     Prosthetics   Current prosthetic wear tolerance (days/week)  daily   Current prosthetic wear tolerance (#hours/day)  all awake hours with breaks to dry through out day   Residual limb condition  skin intact with no issues noted except for small bruise on distal/medial end of limb (just below incision line).    Education Provided Residual limb care;Correct ply sock adjustment   Person(s) Educated Patient   Education Method Explanation;Demonstration;Verbal cues   Education Method Verbalized understanding;Verbal cues required;Needs further instruction   Donning Prosthesis Supervision   Doffing Prosthesis Supervision             PT Short Term Goals - 07/12/16 1441      PT SHORT TERM GOAL #1   Title Patient wil verbalize understanding and adhere to proper prosthesis wear schedule and prosthesis care. (TARGET DATE: 07/15/2016)   Baseline PT reset target date with delayed treatment with wound on limb / hospitalization   Time 5   Period Weeks   Status Achieved     PT SHORT TERM GOAL #2   Title Patient will verbalize understanding and demonstrate HEP activities and exercises. (TARGET DATE: 07/15/2016)   Baseline PT reset target date with delayed treatment with wound on limb / hospitalization   Time 5   Period Weeks   Status Achieved     PT SHORT TERM GOAL #3   Title Patient will score >32/56 on Berg Balance to indicate a decrease in his risk of falling. (TARGET DATE: 07/15/2016)   Baseline 07/12/16: 49/56, indicating a moderate risk for falls   Time 5   Period Weeks   Status Achieved     PT SHORT TERM GOAL #4   Title  Patient will demonstrate ability to ambulate on level, indoor surfaces for 250' with prosthesis and LRAD with supervision. (TARGET DATE: 07/15/2016)   Baseline 1000 feet on level, unlevel, indoor and outdoor surfaces for 1000 feet with prosthesis and no AD with supervision on 07/12/16   Time 5   Period Weeks   Status Achieved           PT Long Term Goals - 07/14/16 1554      PT LONG TERM GOAL #1   Title Patient will verbalize and demonstrate understanding of his ongoing HEP program and fitness. (TARGET DATE: 08/19/2016)   Baseline PT reset target date with delayed treatment with wound on limb / hospitalization   Time 10   Period Weeks   Status On-going     PT LONG TERM GOAL #2   Title Patient will  score >44/56 on the Berg Balance test to indicate a decrease in his risk of falling. (TARGET DATE: 08/19/2016)   Baseline 07/12/16: 49/56, indicating a moderate risk for falls7/3/18: 49/56, indicating a moderate risk for falls   Time 10   Period Weeks   Status Achieved     PT LONG TERM GOAL #3   Title Patient will perform a TUG with prosthesis only in <13s to indicate a decrease in his risk of falling. (TARGET DATE: 08/19/2016)   Baseline PT reset target date with delayed treatment with wound on limb / hospitalization   Time 10   Period Weeks   Status On-going     PT LONG TERM GOAL #4   Title Patient will complete an Functional Gait Assessment with a score of >25 to indicate a decrease in his risk of falling. (TARGET DATE: 08/19/2016)   Baseline 07/14/2016 FGA score = 23/30    Time 10   Period Weeks   Status Revised     PT LONG TERM GOAL #5   Title Patient will ambulate 1000' on unlevel surfaces (outdoors, ramps, curbs, and grass) with LRAD and prosthesis. (TARGET DATE: 08/19/2016)   Baseline PT reset target date with delayed treatment with wound on limb / hospitalization   Time 10   Period Weeks   Status Partially Met     PT LONG TERM GOAL #6   Title Patient performs functional task of  lifting, pushing, pulling with prosthesis safely.  (Target Date: 08/19/2016)   Time 10   Period Weeks   Status On-going            Plan - 07/20/16 1540    Clinical Impression Statement Today's skilled session addressed prosthetic use with gait and balance. 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 Did not have air tank, so used 30# weighted bookbag. Pt with several balance losses on compliant surfaces that he was able to self correct with stepping strategies. Pt continues to be challenged by balance actvities. Pt is progressing toward goals and should benefit from continued PT to progress toward goals.                     Rehab Potential Good   Clinical Impairments Affecting Rehab Potential DM type 1, charcot foot, HTN, HLD   PT Duration Other (comment)   PT Treatment/Interventions ADLs/Self Care Home Management;DME Instruction;Gait training;Stair training;Functional mobility training;Therapeutic activities;Therapeutic exercise;Balance training;Prosthetic Training;Patient/family education;Neuromuscular re-education;Scar mobilization   PT Next Visit Plan Continue with high level balance activities on compliant surfaces; ? if use of AFO/foot plate on charco foot would help with balance   Consulted and Agree with Plan of Care Patient      Patient will benefit from skilled therapeutic intervention in order to improve the following deficits and impairments:  Abnormal gait, Decreased activity tolerance, Decreased balance, Decreased knowledge of precautions, Decreased knowledge of use of DME, Decreased mobility, Difficulty walking, Decreased strength, Decreased skin integrity, Postural dysfunction, Pain  Visit Diagnosis: Other abnormalities of gait and mobility  Unsteadiness on feet  Muscle weakness (generalized)     Problem List Patient Active Problem List   Diagnosis Date Noted  . Chronic fatigue 07/04/2016  . Impaired exercise tolerance 07/04/2016  . Acute left otitis  media 06/09/2016  . Hx of BKA, left - with subsequent cellulitis and treatment for osteomylitis 06/01/2016  . Abscess of left leg 04/26/2016  . Diabetes mellitus with complication ([place])   . Normocytic anemia 01/01/2016  . Osteomyelitis ([place])  12/31/2015  . Diabetic ulcer of left heel associated with type 1 diabetes mellitus, with necrosis of bone ([place]) 12/31/2015  . Ankle wound 12/31/2015  . Chronic osteomyelitis of left ankle with draining sinus ([place] [place])   . Type 1 diabetes, uncontrolled, with Charcot's joint of foot ([place]) 10/12/2015  . Low HDL (under 40) 09/22/2015  . Obesity, Class III, BMI 40-49.9 (morbid obesity) ([place]) 09/22/2015  . H/O noncompliance with medical treatment, presenting hazards to health 09/22/2015  . Hx MRSA infection 09/22/2015  . Vitamin D deficiency 09/22/2015  . Diabetes mellitus type 1 ([place])   . Charcot's joint of foot 11/25/2013  . Essential hypertension 05/03/2013  . Hyperlipidemia 07/26/2012  . Diabetic ketoacidosis ([place])   . GERD (gastroesophageal reflux disease)   . Meniscus tear   . Epigastric pain 12/10/2011  . Vomiting 12/10/2011  . Problems influencing health status 11/21/2011   Willow Ora, PTA, [facility] [address] [place], [place] [address] [phone] 07/21/16, 3:49 PM  Name: ALLIN FRIX MRN: [id number] Date of Birth: [birth year]

## 2016-07-25 ENCOUNTER — Ambulatory Visit: Payer: BLUE CROSS/BLUE SHIELD | Admitting: Physical Therapy

## 2016-07-25 ENCOUNTER — Encounter: Payer: Self-pay | Admitting: Physical Therapy

## 2016-07-25 DIAGNOSIS — M6281 Muscle weakness (generalized): Secondary | ICD-10-CM | POA: Diagnosis not present

## 2016-07-25 DIAGNOSIS — R2689 Other abnormalities of gait and mobility: Secondary | ICD-10-CM | POA: Diagnosis not present

## 2016-07-25 DIAGNOSIS — R2681 Unsteadiness on feet: Secondary | ICD-10-CM | POA: Diagnosis not present

## 2016-07-26 NOTE — Therapy (Signed)
[facility] [address] [place], [place], [address] Phone: [phone]   Fax:  [phone]  Physical Therapy Treatment  Patient Details  Name: Michael Hutchinson MRN: [id number] Date of Birth: 07/26/1975 Referring Provider: Dr. [name]  Encounter Date: 07/25/2016      PT End of Session - 07/25/16 1803    Visit Number 13   Number of Visits 21   Date for PT Re-Evaluation 08/19/16   Authorization Type BC[employer] 30 visit limit with 23 visits remaining   Authorization - Visit Number 13   Authorization - Number of Visits 23   PT Start Time 1404   PT Stop Time 1450   PT Time Calculation (min) 46 min   Equipment Utilized During Treatment Gait belt   Activity Tolerance Patient tolerated treatment well   Behavior During Therapy WFL for tasks assessed/performed      Past Medical History:  Diagnosis Date  . Charcot's joint of foot 11/25/2013  . Complication of anesthesia    "I wake up angry" (12/31/2015)  . Diabetes mellitus type 1 ([facility]) dx'd 1981  . Diabetic ketoacidosis ([facility])   . Essential hypertension 05/03/2013  . GERD (gastroesophageal reflux disease)   . High cholesterol   . Hx MRSA infection    Inner thigh and under arm- healed areas  . Meniscus tear   . Shortness of breath    with exertion only    Past Surgical History:  Procedure Laterality Date  . AMPUTATION Left 01/01/2016   Procedure: AMPUTATION BELOW KNEE;  Surgeon: [name], MD;  Location: [facility];  Service: Orthopedics;  Laterality: Left;  . APPLICATION OF WOUND VAC  04/26/2016  . FRACTURE SURGERY    . HERNIA REPAIR    . I&D EXTREMITY Left 04/26/2016   Procedure: IRRIGATION AND DEBRIDEMENT EXTREMITY/Left Leg/Possible Wound Vac;  Surgeon: [name], MD;  Location: [facility];  Service: Orthopedics;  Laterality: Left;  . INCISE AND DRAIN ABCESS Left 04/26/2016  . KNEE ARTHROSCOPY Left ~ 2010  . LAPAROSCOPIC CHOLECYSTECTOMY  2015  . METACARPOPHALANGEAL  JOINT ARTHRODESIS Left 06/2012   Fracture left index finger intra-articular MCP joint/notes 06/30/2012  . OPEN REDUCTION INTERNAL FIXATION (ORIF) PROXIMAL PHALANX Left 06/30/2012   Procedure: OPEN REDUCTION INTERNAL FIXATION (ORIF) LEFT INDEX FINGER PROXIMAL PHALANX FRACTURE WITH LIGAMENT REPAIR AS NECESSARY;  Surgeon: [name], MD;  Location: [facility];  Service: Orthopedics;  Laterality: Left;  . UMBILICAL HERNIA REPAIR  2015   "w/gallbladder OR"    There were no vitals filed for this visit.      Subjective Assessment - 07/25/16 1407    Subjective He bought a shoe design that PT that specializes in shoes recommended and they seem to help support his right ankle /foot.    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 No/denies                         [facility] Adult PT Treatment/Exercise - 07/25/16 1405      Transfers   Transfers Sit to Stand;Stand to Sit   Sit to Stand 6: Modified independent (Device/Increase time)  Stand to Sit 6: Modified independent (Device/Increase time)   Floor to Transfer 5: Supervision;With upper extremity assist  pushing on mat table    Floor to Transfer Details (indicate cue type and reason) leading with both RLE & LLE     Ambulation/Gait   Ambulation/Gait Yes   Ambulation/Gait Assistance 5: Supervision   Ambulation/Gait Assistance Details Tactile & verbal cues on wt shift onto prosthesis, upright posture & step width.    Ambulation Distance (Feet) 400 Feet   Assistive device Prosthesis;None   Ambulation Surface Indoor;Level;Outdoor;Paved;Gravel;Grass;Unlevel   Stairs Yes   Stairs Assistance 5: Supervision    Stairs Assistance Details (indicate cue type and reason) verbal cues on foot placement & wt shift   Stair Management Technique One rail Left;Alternating pattern;Forwards   Number of Stairs 4   Ramp 5: Supervision  prosthesis only   Ramp Details (indicate cue type and reason) verbal cues on wt shift & posture   Curb 5: Supervision  prosthesis only   Curb Details (indicate cue type and reason) verbal cues on foot placement and step thru pattern     Therapeutic Activites    Lifting Pt able to lift & carry box progressing wt from 15# up to 65# with verbal cues on technique   Work Simulation Pulling simulating pull fire hose with verbal cues on foot placement and wt shifting between feet.      Prosthetics   Current prosthetic wear tolerance (days/week)  daily   Current prosthetic wear tolerance (#hours/day)  all awake hours with breaks to dry through out day   Residual limb condition  skin intact with no issues noted except for small bruise on distal/medial end of limb (just below incision line).    Education Provided Correct ply sock adjustment   Person(s) Educated Patient   Education Method Explanation;Verbal cues   Education Method Verbalized understanding;Needs further instruction                  PT Short Term Goals - 07/12/16 1441      PT SHORT TERM GOAL #1   Title Patient wil verbalize understanding and adhere to proper prosthesis wear schedule and prosthesis care. (TARGET DATE: 07/15/2016)   Baseline PT reset target date with delayed treatment with wound on limb / hospitalization   Time 5   Period Weeks   Status Achieved     PT SHORT TERM GOAL #2   Title Patient will verbalize understanding and demonstrate HEP activities and exercises. (TARGET DATE: 07/15/2016)   Baseline PT reset target date with delayed treatment with wound on limb / hospitalization   Time 5   Period Weeks   Status Achieved     PT SHORT TERM GOAL #3   Title Patient will score >32/56 on Berg  Balance to indicate a decrease in his risk of falling. (TARGET DATE: 07/15/2016)   Baseline 07/12/16: 49/56, indicating a moderate risk for falls   Time 5   Period Weeks   Status Achieved     PT SHORT TERM GOAL #4   Title Patient will demonstrate ability to ambulate on level, indoor surfaces for 250' with prosthesis and LRAD with supervision. (TARGET DATE: 07/15/2016)   Baseline 1000 feet on level, unlevel, indoor and outdoor surfaces for 1000 feet with prosthesis and no AD with supervision on 07/12/16   Time 5   Period Weeks   Status Achieved           PT Long Term Goals - 07/14/16 1554  PT LONG TERM GOAL #1   Title Patient will verbalize and demonstrate understanding of his ongoing HEP program and fitness. (TARGET DATE: 08/19/2016)   Baseline PT reset target date with delayed treatment with wound on limb / hospitalization   Time 10   Period Weeks   Status On-going     PT LONG TERM GOAL #2   Title Patient will score >44/56 on the Berg Balance test to indicate a decrease in his risk of falling. (TARGET DATE: 08/19/2016)   Baseline 07/12/16: 49/56, indicating a moderate risk for falls7/3/18: 49/56, indicating a moderate risk for falls   Time 10   Period Weeks   Status Achieved     PT LONG TERM GOAL #3   Title Patient will perform a TUG with prosthesis only in <13s to indicate a decrease in his risk of falling. (TARGET DATE: 08/19/2016)   Baseline PT reset target date with delayed treatment with wound on limb / hospitalization   Time 10   Period Weeks   Status On-going     PT LONG TERM GOAL #4   Title Patient will complete an Functional Gait Assessment with a score of >25 to indicate a decrease in his risk of falling. (TARGET DATE: 08/19/2016)   Baseline 07/14/2016 FGA score = 23/30    Time 10   Period Weeks   Status Revised     PT LONG TERM GOAL #5   Title Patient will ambulate 1000' on unlevel surfaces (outdoors, ramps, curbs, and grass) with LRAD and prosthesis. (TARGET DATE:  08/19/2016)   Baseline PT reset target date with delayed treatment with wound on limb / hospitalization   Time 10   Period Weeks   Status Partially Met     PT LONG TERM GOAL #6   Title Patient performs functional task of lifting, pushing, pulling with prosthesis safely.  (Target Date: 08/19/2016)   Time 10   Period Weeks   Status On-going               Plan - 07/25/16 1845    Clinical Impression Statement Patient had better right ankle control during dynamic activities including grass with new shoes. Patient improved ability to perform lifting & carrying with skilled PT instruction.    Rehab Potential Good   Clinical Impairments Affecting Rehab Potential DM type 1, charcot foot, HTN, HLD   PT Duration Other (comment)   PT Treatment/Interventions ADLs/Self Care Home Management;DME Instruction;Gait training;Stair training;Functional mobility training;Therapeutic activities;Therapeutic exercise;Balance training;Prosthetic Training;Patient/family education;Neuromuscular re-education;Scar mobilization   PT Next Visit Plan Continue with high level balance activities on compliant surfaces;    Consulted and Agree with Plan of Care Patient      Patient will benefit from skilled therapeutic intervention in order to improve the following deficits and impairments:  Abnormal gait, Decreased activity tolerance, Decreased balance, Decreased knowledge of precautions, Decreased knowledge of use of DME, Decreased mobility, Difficulty walking, Decreased strength, Decreased skin integrity, Postural dysfunction, Pain  Visit Diagnosis: Other abnormalities of gait and mobility  Muscle weakness (generalized)     Problem List Patient Active Problem List   Diagnosis Date Noted  . Chronic fatigue 07/04/2016  . Impaired exercise tolerance 07/04/2016  . Acute left otitis media 06/09/2016  . Hx of BKA, left - with subsequent cellulitis and treatment for osteomylitis 06/01/2016  . Abscess of left leg  04/26/2016  . Diabetes mellitus with complication ([facility])   . Normocytic anemia 01/01/2016  . Osteomyelitis ([facility]) 12/31/2015  . Diabetic ulcer of left  heel associated with type 1 diabetes mellitus, with necrosis of bone ([facility]) 12/31/2015  . Ankle wound 12/31/2015  . Chronic osteomyelitis of left ankle with draining sinus ([facility])   . Type 1 diabetes, uncontrolled, with Charcot's joint of foot ([facility]) 10/12/2015  . Low HDL (under 40) 09/22/2015  . Obesity, Class III, BMI 40-49.9 (morbid obesity) ([facility]) 09/22/2015  . H/O noncompliance with medical treatment, presenting hazards to health 09/22/2015  . Hx MRSA infection 09/22/2015  . Vitamin D deficiency 09/22/2015  . Diabetes mellitus type 1 ([facility])   . Charcot's joint of foot 11/25/2013  . Essential hypertension 05/03/2013  . Hyperlipidemia 07/26/2012  . Diabetic ketoacidosis ([facility])   . GERD (gastroesophageal reflux disease)   . Meniscus tear   . Epigastric pain 12/10/2011  . Vomiting 12/10/2011  . Problems influencing health status 11/21/2011    Jamey Reas PT, DPT 07/26/2016, 10:59 AM  [facility] [address] [place], [place], [address] Phone: [phone]   Fax:  [phone]  Name: Michael Hutchinson MRN: [id number] Date of Birth: 1975/09/12

## 2016-07-27 ENCOUNTER — Encounter: Payer: BLUE CROSS/BLUE SHIELD | Admitting: Physical Therapy

## 2016-07-29 ENCOUNTER — Encounter: Payer: Self-pay | Admitting: Physical Therapy

## 2016-07-29 ENCOUNTER — Ambulatory Visit: Payer: BLUE CROSS/BLUE SHIELD | Admitting: Physical Therapy

## 2016-07-29 DIAGNOSIS — R2689 Other abnormalities of gait and mobility: Secondary | ICD-10-CM

## 2016-07-29 DIAGNOSIS — M6281 Muscle weakness (generalized): Secondary | ICD-10-CM | POA: Diagnosis not present

## 2016-07-29 DIAGNOSIS — R2681 Unsteadiness on feet: Secondary | ICD-10-CM | POA: Diagnosis not present

## 2016-07-29 NOTE — Therapy (Signed)
[facility] [address] [place], [place], [address] Phone: [phone]   Fax:  [phone]  Physical Therapy Treatment  Patient Details  Name: Michael Hutchinson MRN: [id number] Date of Birth: February 20, 1975 Referring Provider: Dr. Toni Arthurs  Encounter Date: 07/29/2016      PT End of Session - 07/29/16 0806    Visit Number 14   Number of Visits 21   Date for PT Re-Evaluation 08/19/16   Authorization Type BCBS 30 visit limit with 23 visits remaining   Authorization - Visit Number 14   Authorization - Number of Visits 23   PT Start Time 0802   PT Stop Time 0840   PT Time Calculation (min) 38 min   Equipment Utilized During Treatment Gait belt   Activity Tolerance Patient tolerated treatment well   Behavior During Therapy WFL for tasks assessed/performed      Past Medical History:  Diagnosis Date  . Charcot's joint of foot 11/25/2013  . Complication of anesthesia    "I wake up angry" (12/31/2015)  . Diabetes mellitus type 1 ([facility]) dx'd 1981  . Diabetic ketoacidosis ([facility])   . Essential hypertension 05/03/2013  . GERD (gastroesophageal reflux disease)   . High cholesterol   . Hx MRSA infection    Inner thigh and under arm- healed areas  . Meniscus tear   . Shortness of breath    with exertion only    Past Surgical History:  Procedure Laterality Date  . AMPUTATION Left 01/01/2016   Procedure: AMPUTATION BELOW KNEE;  Surgeon: Toni Arthurs, MD;  [facility];  Service: Orthopedics;  Laterality: Left;  . APPLICATION OF WOUND VAC  04/26/2016  . FRACTURE SURGERY    . HERNIA REPAIR    . I&D EXTREMITY Left 04/26/2016   Procedure: IRRIGATION AND DEBRIDEMENT EXTREMITY/Left Leg/Possible Wound Vac;  Surgeon: Toni Arthurs, MD;  Location[facility];  Service: Orthopedics;  Laterality: Left;  . INCISE AND DRAIN ABCESS Left 04/26/2016  . KNEE ARTHROSCOPY Left ~ 2010  . LAPAROSCOPIC CHOLECYSTECTOMY  2015  . METACARPOPHALANGEAL  JOINT ARTHRODESIS Left 06/2012   Fracture left index finger intra-articular MCP joint/notes 06/30/2012  . OPEN REDUCTION INTERNAL FIXATION (ORIF) PROXIMAL PHALANX Left 06/30/2012   Procedure: OPEN REDUCTION INTERNAL FIXATION (ORIF) LEFT INDEX FINGER PROXIMAL PHALANX FRACTURE WITH LIGAMENT REPAIR AS NECESSARY;  Surgeon: Dominica Severin, MD;  [facility];  Service: Orthopedics;  Laterality: Left;  . UMBILICAL HERNIA REPAIR  2015   "w/gallbladder OR"    There were no vitals filed for this visit.      Subjective Assessment - 07/29/16 0805    Subjective No new complaints. No falls to report. Having some knee pain, thinks it's arthritis.    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 Yes   Pain Score 4    Pain Location Knee   Pain Orientation Left   Pain Descriptors / Indicators Aching;Dull   Pain Type Acute pain   Pain Onset In the past 7 days   Aggravating Factors  immobility   Pain Relieving Factors moving              [facility] Adult  PT Treatment/Exercise - 07/29/16 3532      Ambulation/Gait   Ambulation/Gait Yes   Ambulation/Gait Assistance 5: Supervision   Ambulation/Gait Assistance Details cues on posture and base of support (to narrow for more normalized base of support).  Pt noted to have shorter limb on prosthesis side.                             Ambulation Distance (Feet) 500 Feet   Assistive device Prosthesis;None   Gait Pattern Step-through pattern;Decreased stride length   Ambulation Surface Level;Unlevel;Indoor;Outdoor;Paved;Gravel;Grass     Prosthetics   Current prosthetic wear tolerance (days/week)  daily   Current prosthetic wear  tolerance (#hours/day)  all awake hours with breaks to dry through out day   Residual limb condition  intact per pt report   Education Provided Correct ply sock adjustment   Person(s) Educated Patient   Education Method Explanation;Demonstration;Verbal cues   Education Method Verbalized understanding;Verbal cues required   Donning Prosthesis Modified independent (device/increased time)   Doffing Prosthesis Modified independent (device/increased time)             Balance Exercises - 07/29/16 0855      Balance Exercises: Standing   Standing Eyes Closed Wide (BOA);Head turns;Foam/compliant surface;Other reps (comment);30 secs;Limitations   Rockerboard Anterior/posterior;Head turns;EO;EC;30 seconds;10 reps;Intermittent UE support   Balance Beam standing across blue foam balance beam: alternating fwd stepping to floor and back onto beam x 10 reps each side, alternating bwd stepping to floor and back onto foam beam x 10 reps each side. intermittent touch to bars for balance with min guard to min assist for balance. cues on step length and weight shifitng with stepping.                         Other Standing Exercises along ~50 foot hallway: forward gait with head movements left<>right, up<>down and diagonals both ways x 4 laps each with min guard assist. mild decrease in gait speed noted, no veering noted.      Balance Exercises: Standing   Standing Eyes Closed Limitations standing across blue foam beam: EC no head movements, progressing to EC with head movements left<>right and up<>down x 10 reps each. intermittent UE touch to parallel bars and min guard to min assist. cues on posture    Rebounder Limitations ant/posterior direction with intermittent UE support: EO- alternating UE raises, progressing to bil UE raises x 10 rep each bil sides; EC- no head movements, progressing to head movements left<>right, up<>down and diagonals both ways x 10 reps each with min guard assist to min assist for  balance. cues on posture and weight shifting.             PT Short Term Goals - 07/12/16 1441      PT SHORT TERM GOAL #1   Title Patient wil verbalize understanding and adhere to proper prosthesis wear schedule and prosthesis care. (TARGET DATE: 07/15/2016)   Baseline PT reset target date with delayed treatment with wound on limb / hospitalization   Time 5   Period Weeks   Status Achieved     PT SHORT TERM GOAL #2   Title Patient will verbalize understanding and demonstrate HEP activities and exercises. (TARGET DATE: 07/15/2016)   Baseline PT reset target date with delayed treatment with wound on limb / hospitalization   Time 5   Period Weeks   Status Achieved  PT SHORT TERM GOAL #3   Title Patient will score >32/56 on Berg Balance to indicate a decrease in his risk of falling. (TARGET DATE: 07/15/2016)   Baseline 07/12/16: 49/56, indicating a moderate risk for falls   Time 5   Period Weeks   Status Achieved     PT SHORT TERM GOAL #4   Title Patient will demonstrate ability to ambulate on level, indoor surfaces for 250' with prosthesis and LRAD with supervision. (TARGET DATE: 07/15/2016)   Baseline 1000 feet on level, unlevel, indoor and outdoor surfaces for 1000 feet with prosthesis and no AD with supervision on 07/12/16   Time 5   Period Weeks   Status Achieved           PT Long Term Goals - 07/14/16 1554      PT LONG TERM GOAL #1   Title Patient will verbalize and demonstrate understanding of his ongoing HEP program and fitness. (TARGET DATE: 08/19/2016)   Baseline PT reset target date with delayed treatment with wound on limb / hospitalization   Time 10   Period Weeks   Status On-going     PT LONG TERM GOAL #2   Title Patient will score >44/56 on the Berg Balance test to indicate a decrease in his risk of falling. (TARGET DATE: 08/19/2016)   Baseline 07/12/16: 49/56, indicating a moderate risk for falls7/3/18: 49/56, indicating a moderate risk for falls   Time 10    Period Weeks   Status Achieved     PT LONG TERM GOAL #3   Title Patient will perform a TUG with prosthesis only in <13s to indicate a decrease in his risk of falling. (TARGET DATE: 08/19/2016)   Baseline PT reset target date with delayed treatment with wound on limb / hospitalization   Time 10   Period Weeks   Status On-going     PT LONG TERM GOAL #4   Title Patient will complete an Functional Gait Assessment with a score of >25 to indicate a decrease in his risk of falling. (TARGET DATE: 08/19/2016)   Baseline 07/14/2016 FGA score = 23/30    Time 10   Period Weeks   Status Revised     PT LONG TERM GOAL #5   Title Patient will ambulate 1000' on unlevel surfaces (outdoors, ramps, curbs, and grass) with LRAD and prosthesis. (TARGET DATE: 08/19/2016)   Baseline PT reset target date with delayed treatment with wound on limb / hospitalization   Time 10   Period Weeks   Status Partially Met     PT LONG TERM GOAL #6   Title Patient performs functional task of lifting, pushing, pulling with prosthesis safely.  (Target Date: 08/19/2016)   Time 10   Period Weeks   Status On-going               Plan - 07/29/16 0807    Clinical Impression Statement Today's skilled session continued to address gait on various surfaces with prosthesis only and high level balance activities. Pt is progressing well toward goals and should benefit from continued PT to progress toward unmet goals.    Rehab Potential Good   Clinical Impairments Affecting Rehab Potential DM type 1, charcot foot, HTN, HLD   PT Duration Other (comment)   PT Treatment/Interventions ADLs/Self Care Home Management;DME Instruction;Gait training;Stair training;Functional mobility training;Therapeutic activities;Therapeutic exercise;Balance training;Prosthetic Training;Patient/family education;Neuromuscular re-education;Scar mobilization   PT Next Visit Plan Continue with high level balance activities on compliant surfaces;    Consulted  and Agree with Plan of Care Patient      Patient will benefit from skilled therapeutic intervention in order to improve the following deficits and impairments:  Abnormal gait, Decreased activity tolerance, Decreased balance, Decreased knowledge of precautions, Decreased knowledge of use of DME, Decreased mobility, Difficulty walking, Decreased strength, Decreased skin integrity, Postural dysfunction, Pain  Visit Diagnosis: Other abnormalities of gait and mobility  Muscle weakness (generalized)  Unsteadiness on feet     Problem List Patient Active Problem List   Diagnosis Date Noted  . Chronic fatigue 07/04/2016  . Impaired exercise tolerance 07/04/2016  . Acute left otitis media 06/09/2016  . Hx of BKA, left - with subsequent cellulitis and treatment for osteomylitis 06/01/2016  . Abscess of left leg 04/26/2016  . Diabetes mellitus with complication ([place])   . Normocytic anemia 01/01/2016  . Osteomyelitis ([place] [address]) 12/31/2015  . Diabetic ulcer of left heel associated with type 1 diabetes mellitus, with necrosis of bone ([place]) 12/31/2015  . Ankle wound 12/31/2015  . Chronic osteomyelitis of left ankle with draining sinus ([place])   . Type 1 diabetes, uncontrolled, with Charcot's joint of foot ([place]) 10/12/2015  . Low HDL (under 40) 09/22/2015  . Obesity, Class III, BMI 40-49.9 (morbid obesity) ([place]) 09/22/2015  . H/O noncompliance with medical treatment, presenting hazards to health 09/22/2015  . Hx MRSA infection 09/22/2015  . Vitamin D deficiency 09/22/2015  . Diabetes mellitus type 1 ([place])   . Charcot's joint of foot 11/25/2013  . Essential hypertension 05/03/2013  . Hyperlipidemia 07/26/2012  . Diabetic ketoacidosis ([place])   . GERD (gastroesophageal reflux disease)   . Meniscus tear   . Epigastric pain 12/10/2011  . Vomiting 12/10/2011  . Problems influencing health status 11/21/2011    Willow Ora, PTA, [facility] [address] [place], [place] [address] [phone] 07/29/16, 3:48 PM   Name: Michael Hutchinson MRN: [id number] Date of Birth: December 15, 1975

## 2016-08-01 ENCOUNTER — Ambulatory Visit: Payer: BLUE CROSS/BLUE SHIELD | Admitting: Physical Therapy

## 2016-08-01 ENCOUNTER — Encounter: Payer: Self-pay | Admitting: Physical Therapy

## 2016-08-01 DIAGNOSIS — R2681 Unsteadiness on feet: Secondary | ICD-10-CM

## 2016-08-01 DIAGNOSIS — M6281 Muscle weakness (generalized): Secondary | ICD-10-CM | POA: Diagnosis not present

## 2016-08-01 DIAGNOSIS — R2689 Other abnormalities of gait and mobility: Secondary | ICD-10-CM | POA: Diagnosis not present

## 2016-08-01 NOTE — Therapy (Signed)
[facility] [address], [place], [address] Phone: [phone]   Fax:  [phone]  Physical Therapy Treatment  Patient Details  Name: Michael Hutchinson MRN: [id number] Date of Birth: [birth year] Referring Provider: Dr. [name]  Encounter Date: 08/01/2016      PT End of Session - 08/01/16 1555    Visit Number 15   Number of Visits 21   Date for PT Re-Evaluation 08/19/16   Authorization Type [employer] 30 visit limit with 23 visits remaining   Authorization - Visit Number 15   Authorization - Number of Visits 23   PT Start Time 0248   PT Stop Time 0329   PT Time Calculation (min) 41 min   Equipment Utilized During Treatment Gait belt   Activity Tolerance Patient tolerated treatment well   Behavior During Therapy [facility] for tasks assessed/performed      Past Medical History:  Diagnosis Date  . Charcot's joint of foot 11/25/2013  . Complication of anesthesia    "I wake up angry" (12/31/2015)  . Diabetes mellitus type 1 ([place]) dx'd 1981  . Diabetic ketoacidosis ([place])   . Essential hypertension 05/03/2013  . GERD (gastroesophageal reflux disease)   . High cholesterol   . Hx MRSA infection    Inner thigh and under arm- healed areas  . Meniscus tear   . Shortness of breath    with exertion only    Past Surgical History:  Procedure Laterality Date  . AMPUTATION Left 01/01/2016   Procedure: AMPUTATION BELOW KNEE;  Surgeon: [name], MD;  Location: [facility];  Service: Orthopedics;  Laterality: Left;  . APPLICATION OF WOUND VAC  04/26/2016  . FRACTURE SURGERY    . HERNIA REPAIR    . I&D EXTREMITY Left 04/26/2016   Procedure: IRRIGATION AND DEBRIDEMENT EXTREMITY/Left Leg/Possible Wound Vac;  Surgeon: [name], MD;  Location: [facility];  Service: Orthopedics;  Laterality: Left;  . INCISE AND DRAIN ABCESS Left 04/26/2016  . KNEE ARTHROSCOPY Left ~ 2010  . LAPAROSCOPIC CHOLECYSTECTOMY  2015  . METACARPOPHALANGEAL  JOINT ARTHRODESIS Left 06/2012   Fracture left index finger intra-articular MCP joint/notes 06/30/2012  . OPEN REDUCTION INTERNAL FIXATION (ORIF) PROXIMAL PHALANX Left 06/30/2012   Procedure: OPEN REDUCTION INTERNAL FIXATION (ORIF) LEFT INDEX FINGER PROXIMAL PHALANX FRACTURE WITH LIGAMENT REPAIR AS NECESSARY;  Surgeon: [name], MD;  Location: [facility];  Service: Orthopedics;  Laterality: Left;  . UMBILICAL HERNIA REPAIR  2015   "w/gallbladder OR"    There were no vitals filed for this visit.          [facility] Adult PT Treatment/Exercise - 08/01/16 0001      Neuro Re-ed    Neuro Re-ed Details  Level and Compliant surfaces     Prosthetics   Current prosthetic wear tolerance (days/week)  daily   Current prosthetic wear tolerance (#hours/day)  all awake hours with breaks to dry through out day   Residual limb condition  heat rash below the knee extending laterally around both sides   Education Provided Residual limb care  discussed use of deoderant over residual limb    Person(s) Educated Patient   Education Method Explanation   Education Method Verbalized understanding   Donning Prosthesis Modified independent (device/increased time)   Doffing Prosthesis Modified independent (device/increased time)     Neuromuscular re-education:  Compliant surface: Blue foam beam inside parallel bars: --mini squats, no UE support x10, min guard --alternating heel taps R<>L, min guard, Verbal and visual  cues for weight shifting.  Blue foam beam placed approximately 1  feet from wall with chair placed in front for support if needed:  --wall bumps x12 anterior/posterior, x10 R/L lateral, min guard  Level surface:  --Braiding R<>L inside parallel bars x3 laps, B UE support, VC for technique, posture, min guard;  --High knees forward<>backward with 3 second hold, unilateral UE support, x3 laps, min guard, verbal and visual cues for weight shifting.  --Tandem walking at counter forward<>backward  x2 laps, unilateral UE support, VC for weight shifting and posture, min guard; --SLS  foam bubble tap anterior/lateral/posterior x10 each R &L, unilateral UE support; verbal and visual cues for weight shifting and posture, min guard.        PT Short Term Goals - 07/12/16 1441      PT SHORT TERM GOAL #1   Title Patient wil verbalize understanding and adhere to proper prosthesis wear schedule and prosthesis care. (TARGET DATE: 07/15/2016)   Baseline PT reset target date with delayed treatment with wound on limb / hospitalization   Time 5   Period Weeks   Status Achieved     PT SHORT TERM GOAL #2   Title Patient will verbalize understanding and demonstrate HEP activities and exercises. (TARGET DATE: 07/15/2016)   Baseline PT reset target date with delayed treatment with wound on limb / hospitalization   Time 5   Period Weeks   Status Achieved     PT SHORT TERM GOAL #3   Title Patient will score >32/56 on Berg Balance to indicate a decrease in his risk of falling. (TARGET DATE: 07/15/2016)   Baseline 07/12/16: 49/56, indicating a moderate risk for falls   Time 5   Period Weeks   Status Achieved     PT SHORT TERM GOAL #4   Title Patient will demonstrate ability to ambulate on level, indoor surfaces for 250' with prosthesis and LRAD with supervision. (TARGET DATE: 07/15/2016)   Baseline 1000 feet on level, unlevel, indoor and outdoor surfaces for 1000 feet with prosthesis and no AD with supervision on 07/12/16   Time 5   Period Weeks   Status Achieved           PT Long Term Goals - 07/14/16 1554      PT LONG TERM GOAL #1   Title Patient will verbalize and demonstrate understanding of his ongoing HEP program and fitness. (TARGET DATE: 08/19/2016)   Baseline PT reset target date with delayed treatment with wound on limb / hospitalization   Time 10   Period Weeks   Status On-going     PT LONG TERM GOAL #2   Title Patient will score >44/56 on the Berg Balance test to indicate a decrease  in his risk of falling. (TARGET DATE: 08/19/2016)   Baseline 07/12/16: 49/56, indicating a moderate risk for falls7/3/18: 49/56, indicating a moderate risk for falls   Time 10   Period Weeks   Status Achieved     PT LONG TERM GOAL #3   Title Patient will perform a TUG with prosthesis only in <13s to indicate a decrease in his risk of falling. (TARGET DATE: 08/19/2016)   Baseline PT reset target date with delayed treatment with wound on limb / hospitalization   Time 10   Period Weeks   Status On-going     PT LONG TERM GOAL #4   Title Patient will complete an Functional Gait Assessment with a score of >25 to indicate a decrease in his risk of  falling. (TARGET DATE: 08/19/2016)   Baseline 07/14/2016 FGA score = 23/30    Time 10   Period Weeks   Status Revised     PT LONG TERM GOAL #5   Title Patient will ambulate 1000' on unlevel surfaces (outdoors, ramps, curbs, and grass) with LRAD and prosthesis. (TARGET DATE: 08/19/2016)   Baseline PT reset target date with delayed treatment with wound on limb / hospitalization   Time 10   Period Weeks   Status Partially Met     PT LONG TERM GOAL #6   Title Patient performs functional task of lifting, pushing, pulling with prosthesis safely.  (Target Date: 08/19/2016)   Time 10   Period Weeks   Status On-going               Plan - 08/01/16 1606    Clinical Impression Statement Today's skilled session focused on dynamic balance in standing and with gait. Patient demonstrated improved balance on compliant surfaces as seen in improved independence finding and regaining balance when lost. Improved control and balance noted performing foam bubble taps in SLS. Patient is progressing well towards goals and will benefit from continued PT to progress towards unmet goals.   Rehab Potential Good   Clinical Impairments Affecting Rehab Potential DM type 1, charcot foot, HTN, HLD   PT Frequency 2x / week   PT Duration Other (comment)   PT  Treatment/Interventions ADLs/Self Care Home Management;DME Instruction;Gait training;Stair training;Functional mobility training;Therapeutic activities;Therapeutic exercise;Balance training;Prosthetic Training;Patient/family education;Neuromuscular re-education;Scar mobilization   PT Next Visit Plan Continue with high level balance activities on compliant surfaces;    Consulted and Agree with Plan of Care Patient      Patient will benefit from skilled therapeutic intervention in order to improve the following deficits and impairments:  Abnormal gait, Decreased activity tolerance, Decreased balance, Decreased knowledge of precautions, Decreased knowledge of use of DME, Decreased mobility, Difficulty walking, Decreased strength, Decreased skin integrity, Postural dysfunction, Pain  Visit Diagnosis: Other abnormalities of gait and mobility  Muscle weakness (generalized)  Unsteadiness on feet     Problem List Patient Active Problem List   Diagnosis Date Noted  . Chronic fatigue 07/04/2016  . Impaired exercise tolerance 07/04/2016  . Acute left otitis media 06/09/2016  . Hx of BKA, left - with subsequent cellulitis and treatment for osteomylitis 06/01/2016  . Abscess of left leg 04/26/2016  . Diabetes mellitus with complication ([place])   . Normocytic anemia 01/01/2016  . Osteomyelitis ([place]) 12/31/2015  . Diabetic ulcer of left heel associated with type 1 diabetes mellitus, with necrosis of bone ([place]) 12/31/2015  . Ankle wound 12/31/2015  . Chronic osteomyelitis of left ankle with draining sinus ([place])   . Type 1 diabetes, uncontrolled, with Charcot's joint of foot ([place]) 10/12/2015  . Low HDL (under 40) 09/22/2015  . Obesity, Class III, BMI 40-49.9 (morbid obesity) ([place]) 09/22/2015  . H/O noncompliance with medical treatment, presenting hazards to health 09/22/2015  . Hx MRSA infection 09/22/2015  . Vitamin D deficiency 09/22/2015  . Diabetes mellitus type 1 ([place])   . Charcot's joint of  foot 11/25/2013  . Essential hypertension 05/03/2013  . Hyperlipidemia 07/26/2012  . Diabetic ketoacidosis ([place])   . GERD (gastroesophageal reflux disease)   . Meniscus tear   . Epigastric pain 12/10/2011  . Vomiting 12/10/2011  . Problems influencing health status 11/21/2011    Rulon Eisenmenger, SPTA 08/01/2016, [facility] [address] [place], [place], [address]  Phone: [phone]   Fax:  [phone]  Name: ELIJIAH MICKLEY MRN: [id number] Date of Birth: [birth year]

## 2016-08-03 ENCOUNTER — Encounter: Payer: Self-pay | Admitting: Physical Therapy

## 2016-08-03 ENCOUNTER — Ambulatory Visit: Payer: BLUE CROSS/BLUE SHIELD | Admitting: Physical Therapy

## 2016-08-03 DIAGNOSIS — R2681 Unsteadiness on feet: Secondary | ICD-10-CM | POA: Diagnosis not present

## 2016-08-03 DIAGNOSIS — M6281 Muscle weakness (generalized): Secondary | ICD-10-CM | POA: Diagnosis not present

## 2016-08-03 DIAGNOSIS — R2689 Other abnormalities of gait and mobility: Secondary | ICD-10-CM | POA: Diagnosis not present

## 2016-08-03 NOTE — Therapy (Signed)
[facility] [address] [place], [place], [address] Phone: [phone]   Fax:  [phone]  Physical Therapy Treatment  Patient Details  Name: Michael Hutchinson MRN: [id number] Date of Birth: [birth year] Referring Provider: Dr. [name]  Encounter Date: 08/03/2016      PT End of Session - 08/03/16 1600    Visit Number 16   Number of Visits 21   Date for PT Re-Evaluation 08/19/16   Authorization Type [employer] 30 visit limit with 23 visits remaining   Authorization - Visit Number 16   Authorization - Number of Visits 23   PT Start Time 1446   PT Stop Time 1530   PT Time Calculation (min) 44 min   Equipment Utilized During Treatment Gait belt   Activity Tolerance Patient tolerated treatment well   Behavior During Therapy WFL for tasks assessed/performed      Past Medical History:  Diagnosis Date  . Charcot's joint of foot 11/25/2013  . Complication of anesthesia    "I wake up angry" (12/31/2015)  . Diabetes mellitus type 1 ([place]) dx'd 1981  . Diabetic ketoacidosis ([place])   . Essential hypertension 05/03/2013  . GERD (gastroesophageal reflux disease)   . High cholesterol   . Hx MRSA infection    Inner thigh and under arm- healed areas  . Meniscus tear   . Shortness of breath    with exertion only    Past Surgical History:  Procedure Laterality Date  . AMPUTATION Left 01/01/2016   Procedure: AMPUTATION BELOW KNEE;  Surgeon: [name], MD;  Location: [facility];  Service: Orthopedics;  Laterality: Left;  . APPLICATION OF WOUND VAC  04/26/2016  . FRACTURE SURGERY    . HERNIA REPAIR    . I&D EXTREMITY Left 04/26/2016   Procedure: IRRIGATION AND DEBRIDEMENT EXTREMITY/Left Leg/Possible Wound Vac;  Surgeon: [name], MD;  Location: [facility];  Service: Orthopedics;  Laterality: Left;  . INCISE AND DRAIN ABCESS Left 04/26/2016  . KNEE ARTHROSCOPY Left ~ 2010  . LAPAROSCOPIC CHOLECYSTECTOMY  2015  . METACARPOPHALANGEAL  JOINT ARTHRODESIS Left 06/2012   Fracture left index finger intra-articular MCP joint/notes 06/30/2012  . OPEN REDUCTION INTERNAL FIXATION (ORIF) PROXIMAL PHALANX Left 06/30/2012   Procedure: OPEN REDUCTION INTERNAL FIXATION (ORIF) LEFT INDEX FINGER PROXIMAL PHALANX FRACTURE WITH LIGAMENT REPAIR AS NECESSARY;  Surgeon: [name], MD;  Location: [facility];  Service: Orthopedics;  Laterality: Left;  . UMBILICAL HERNIA REPAIR  2015   "w/gallbladder OR"    There were no vitals filed for this visit.      Subjective Assessment - 08/03/16 1536    Subjective Pt. reports no new complaints, falls or pain. Reports he thinks his heat rash on his residual limb is improving.   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 No/denies   Pain Score 0-No pain            [facility] Adult PT Treatment/Exercise - 08/03/16 1451      Neuro Re-ed    Neuro Re-ed Details  Level and Compliant surfaces     Prosthetics   Current prosthetic wear tolerance (days/week)  daily   Current prosthetic wear tolerance (#hours/day)  all awake hours  Residual limb condition  heat rash improving, less itchy, less burning, less red; pt reports he is using antiperspirant on residual limb and baby oil on area underneath top of liner   Education Provided Residual limb care   Person(s) Educated Patient   Education Method Explanation   Education Method Verbalized understanding   Donning Prosthesis Modified independent (device/increased time)   Doffing Prosthesis Modified independent (device/increased time)    Neuromuscular re-education:  Compliant surfaces:  Red mats placed in front of counters:  --side step with squat x3 laps; VC for  technique, posture, min guard   Blue mat placed on incline:  --split stance R leg lead: head turns R<>L, diagonals both directions x10 each;  --L leg lead: head turns R<>L, up<>down, diagonals both ways x10 each; min guard to min assist with intermittent mild LOB.  Blue foam beam placed in front of wall with chair placed in front for support: --wall bumps x10 no UE assist;  --lateral wall falls R and L x10 each standing on blue foam beam; no UE support; VC for weight shifting, min guard.  Level surface inside parallel bars:  --SLS with unilateral UE support on bars + rolling tennis ball (forward/backwards, side to side, circles), VC for weight shifting and posture, min guard;  --high hurdles placed inside parallel bars: reciprocal step over, bilateral to unilateral UE support as weight shifting and balance improved x8 laps, min guard, VC for technique;  --alternating foam bubble anterior tap and cross tap x6 each, unilateral support to bilateral support as pt fatigued, VC for weight shifting, min guard.         PT Short Term Goals - 07/12/16 1441      PT SHORT TERM GOAL #1   Title Patient wil verbalize understanding and adhere to proper prosthesis wear schedule and prosthesis care. (TARGET DATE: 07/15/2016)   Baseline PT reset target date with delayed treatment with wound on limb / hospitalization   Time 5   Period Weeks   Status Achieved     PT SHORT TERM GOAL #2   Title Patient will verbalize understanding and demonstrate HEP activities and exercises. (TARGET DATE: 07/15/2016)   Baseline PT reset target date with delayed treatment with wound on limb / hospitalization   Time 5   Period Weeks   Status Achieved     PT SHORT TERM GOAL #3   Title Patient will score >32/56 on Berg Balance to indicate a decrease in his risk of falling. (TARGET DATE: 07/15/2016)   Baseline 07/12/16: 49/56, indicating a moderate risk for falls   Time 5   Period Weeks   Status Achieved     PT SHORT TERM  GOAL #4   Title Patient will demonstrate ability to ambulate on level, indoor surfaces for 250' with prosthesis and LRAD with supervision. (TARGET DATE: 07/15/2016)   Baseline 1000 feet on level, unlevel, indoor and outdoor surfaces for 1000 feet with prosthesis and no AD with supervision on 07/12/16   Time 5   Period Weeks   Status Achieved           PT Long Term Goals - 07/14/16 1554      PT LONG TERM GOAL #1   Title Patient will verbalize and demonstrate understanding of his ongoing HEP program and fitness. (TARGET DATE: 08/19/2016)   Baseline PT reset target date with delayed treatment with wound on limb / hospitalization   Time 10   Period Weeks   Status On-going  PT LONG TERM GOAL #2   Title Patient will score >44/56 on the Berg Balance test to indicate a decrease in his risk of falling. (TARGET DATE: 08/19/2016)   Baseline 07/12/16: 49/56, indicating a moderate risk for falls7/3/18: 49/56, indicating a moderate risk for falls   Time 10   Period Weeks   Status Achieved     PT LONG TERM GOAL #3   Title Patient will perform a TUG with prosthesis only in <13s to indicate a decrease in his risk of falling. (TARGET DATE: 08/19/2016)   Baseline PT reset target date with delayed treatment with wound on limb / hospitalization   Time 10   Period Weeks   Status On-going     PT LONG TERM GOAL #4   Title Patient will complete an Functional Gait Assessment with a score of >25 to indicate a decrease in his risk of falling. (TARGET DATE: 08/19/2016)   Baseline 07/14/2016 FGA score = 23/30    Time 10   Period Weeks   Status Revised     PT LONG TERM GOAL #5   Title Patient will ambulate 1000' on unlevel surfaces (outdoors, ramps, curbs, and grass) with LRAD and prosthesis. (TARGET DATE: 08/19/2016)   Baseline PT reset target date with delayed treatment with wound on limb / hospitalization   Time 10   Period Weeks   Status Partially Met     PT LONG TERM GOAL #6   Title Patient performs  functional task of lifting, pushing, pulling with prosthesis safely.  (Target Date: 08/19/2016)   Time 10   Period Weeks   Status On-going               Plan - 08/03/16 1600    Clinical Impression Statement Today's session focused on dynamic balance in standing and with gait including focus on SLS. Pt. demonstrated endurance completing challenging session of high level balance activities with accuracy without rest and little evidence of fatigue until end of very last exercise. Dynamic balance with head turns in split stance on compliant surface with prosthetic leg leading up ramp and SLS foam bubble taps presented increased challenge.  Pt. is progressing well towards goals and will benefit from continued skilled PT to progress towards unmet goals.   Clinical Impairments Affecting Rehab Potential DM type 1, charcot foot, HTN, HLD   PT Frequency 2x / week   PT Duration Other (comment)   PT Treatment/Interventions ADLs/Self Care Home Management;DME Instruction;Gait training;Stair training;Functional mobility training;Therapeutic activities;Therapeutic exercise;Balance training;Prosthetic Training;Patient/family education;Neuromuscular re-education;Scar mobilization   PT Next Visit Plan Continue with high level balance activities on compliant surfaces;       Patient will benefit from skilled therapeutic intervention in order to improve the following deficits and impairments:  Abnormal gait, Decreased activity tolerance, Decreased balance, Decreased knowledge of precautions, Decreased knowledge of use of DME, Decreased mobility, Difficulty walking, Decreased strength, Decreased skin integrity, Postural dysfunction, Pain  Visit Diagnosis: Other abnormalities of gait and mobility  Muscle weakness (generalized)  Unsteadiness on feet     Problem List Patient Active Problem List   Diagnosis Date Noted  . Chronic fatigue 07/04/2016  . Impaired exercise tolerance 07/04/2016  . Acute left  otitis media 06/09/2016  . Hx of BKA, left - with subsequent cellulitis and treatment for osteomylitis 06/01/2016  . Abscess of left leg 04/26/2016  . Diabetes mellitus with complication ([place])   . Normocytic anemia 01/01/2016  . Osteomyelitis ([place]) 12/31/2015  . Diabetic ulcer of left heel associated  with type 1 diabetes mellitus, with necrosis of bone ([place]) 12/31/2015  . Ankle wound 12/31/2015  . Chronic osteomyelitis of left ankle with draining sinus ([place])   . Type 1 diabetes, uncontrolled, with Charcot's joint of foot ([place]) 10/12/2015  . Low HDL (under 40) 09/22/2015  . Obesity, Class III, BMI 40-49.9 (morbid obesity) ([place]) 09/22/2015  . H/O noncompliance with medical treatment, presenting hazards to health 09/22/2015  . Hx MRSA infection 09/22/2015  . Vitamin D deficiency 09/22/2015  . Diabetes mellitus type 1 ([place])   . Charcot's joint of foot 11/25/2013  . Essential hypertension 05/03/2013  . Hyperlipidemia 07/26/2012  . Diabetic ketoacidosis ([place])   . GERD (gastroesophageal reflux disease)   . Meniscus tear   . Epigastric pain 12/10/2011  . Vomiting 12/10/2011  . Problems influencing health status 11/21/2011    Rulon Eisenmenger, SPTA 08/03/2016, 4:08 PM  [facility] [address] [place], [place], [address] Phone: [phone]   Fax:  [phone]  Name: Michael Hutchinson MRN: [id number] Date of Birth: 01/10/1976  This note has been reviewed and edited by supervising CI.  Willow Ora, PTA, [facility] [address] [place],  [address] [phone] 08/03/16, 4:12 PM

## 2016-08-08 ENCOUNTER — Encounter: Payer: Self-pay | Admitting: Physical Therapy

## 2016-08-08 ENCOUNTER — Other Ambulatory Visit: Payer: Self-pay | Admitting: Internal Medicine

## 2016-08-08 ENCOUNTER — Ambulatory Visit: Payer: BLUE CROSS/BLUE SHIELD | Admitting: Physical Therapy

## 2016-08-08 DIAGNOSIS — R2689 Other abnormalities of gait and mobility: Secondary | ICD-10-CM | POA: Diagnosis not present

## 2016-08-08 DIAGNOSIS — R2681 Unsteadiness on feet: Secondary | ICD-10-CM

## 2016-08-08 DIAGNOSIS — M6281 Muscle weakness (generalized): Secondary | ICD-10-CM

## 2016-08-08 NOTE — Therapy (Signed)
[facility] [address] [place], [place], [address] Phone: [phone]   Fax:  [phone]  Physical Therapy Treatment  Patient Details  Name: Michael Hutchinson MRN: [id number] Date of Birth: [birth year] Referring Provider: Dr. [name]  Encounter Date: 08/08/2016      PT End of Session - 08/08/16 2215    Visit Number 17   Number of Visits 21   Date for PT Re-Evaluation 08/19/16   Authorization Type BCBS 30 visit limit with 23 visits remaining   Authorization - Visit Number 26   Authorization - Number of Visits 23   Equipment Utilized During Treatment Gait belt   Activity Tolerance Patient tolerated treatment well   Behavior During Therapy [facility] for tasks assessed/performed      Past Medical History:  Diagnosis Date  . Charcot's joint of foot 11/25/2013  . Complication of anesthesia    "I wake up angry" (12/31/2015)  . Diabetes mellitus type 1 ([place]) dx'd 1981  . Diabetic ketoacidosis ([place])   . Essential hypertension 05/03/2013  . GERD (gastroesophageal reflux disease)   . High cholesterol   . Hx MRSA infection    Inner thigh and under arm- healed areas  . Meniscus tear   . Shortness of breath    with exertion only    Past Surgical History:  Procedure Laterality Date  . AMPUTATION Left 01/01/2016   Procedure: AMPUTATION BELOW KNEE;  Surgeon: [name], MD;  Location: [facility];  Service: Orthopedics;  Laterality: Left;  . APPLICATION OF WOUND VAC  04/26/2016  . FRACTURE SURGERY    . HERNIA REPAIR    . I&D EXTREMITY Left 04/26/2016   Procedure: IRRIGATION AND DEBRIDEMENT EXTREMITY/Left Leg/Possible Wound Vac;  Surgeon: [name], MD;  Location: [facility];  Service: Orthopedics;  Laterality: Left;  . INCISE AND DRAIN ABCESS Left 04/26/2016  . KNEE ARTHROSCOPY Left ~ 2010  . LAPAROSCOPIC CHOLECYSTECTOMY  2015  . METACARPOPHALANGEAL JOINT ARTHRODESIS Left 06/2012   Fracture left index finger intra-articular MCP  joint/notes 06/30/2012  . OPEN REDUCTION INTERNAL FIXATION (ORIF) PROXIMAL PHALANX Left 06/30/2012   Procedure: OPEN REDUCTION INTERNAL FIXATION (ORIF) LEFT INDEX FINGER PROXIMAL PHALANX FRACTURE WITH LIGAMENT REPAIR AS NECESSARY;  Surgeon: [name], MD;  Location: [facility];  Service: Orthopedics;  Laterality: Left;  . UMBILICAL HERNIA REPAIR  2015   "w/gallbladder OR"    There were no vitals filed for this visit.      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    Patient is accompained by: Family member   Pertinent History L transtibial amputation, DM type 1, charcot foot, HTN, HLD    Limitations Standing;Walking;Lifting;House hold activities   Currently in Pain? No/denies                         [facility] Adult PT Treatment/Exercise - 08/08/16 1400      Transfers   Floor to Transfer 5: Supervision;With upper extremity assist  one hand on vertical (wall) & 1 hand on horizontal (chair)   Floor to Transfer Details (indicate cue type and reason) unable to arise with 2 hands on vertical wall.     Ambulation/Gait   Ambulation/Gait Yes   Ambulation/Gait Assistance 5: Supervision   Ambulation/Gait Assistance Details verbal cues on use of over the counter ankle brace to protect ankle on  uneven terrain or work activities.    Ambulation Distance (Feet) 1000 Feet  500 indoors & 500 outdoors   Assistive device Prosthesis;None   Gait Pattern Step-through pattern;Decreased stride length   Ambulation Surface Indoor;Level;Outdoor;Unlevel;Paved;Gravel;Grass   Stairs Yes   Stairs Assistance 5: Supervision   Stair Management Technique Alternating pattern;One rail Left;Forwards   Number of Stairs 4  5 reps   Ramp 5: Supervision  prosthesis only   Curb 5: Supervision  prosthesis only     High Level Balance   High Level Balance Activities Side stepping;Backward  walking;Direction changes;Turns;Head turns;Marching forwards;Negotiating over obstacles;Negotitating around obstacles  indoors & outdoors on grass   High Level Balance Comments balance losses with step strategy to self correct.  PT demo wt shift & foot motion for turning 90* & 180*. Pt return demo understanding.      Therapeutic Activites    Lifting Pt able to lift & carry box progressing wt from 15# up to 65# with verbal cues on technique   Work Simulation Pulling simulating pull fire hose with verbal cues on foot placement and wt shifting between feet.    Other Therapeutic Activities Pt climb A-frame ladder and simulated 2 hand task standing on ladder with PT verbal cues.      Neuro Re-ed    Neuro Re-ed Details  Level and Compliant surfaces     Prosthetics   Prosthetic Care Comments  use of anit-perspirant at night to enable to absorb. Assuring no wrinkles esp popliteal area.    Current prosthetic wear tolerance (days/week)  daily   Current prosthetic wear tolerance (#hours/day)  all awake hours   Residual limb condition  heat rash & blister in popliteal area.    Education Provided Residual limb care;Proper Donning;Other (comment)  see prosthetic care, decreasing frequency of drying   Person(s) Educated Patient   Education Method Explanation;Demonstration;Verbal cues   Education Method Verbalized understanding;Verbal cues required;Needs further instruction                  PT Short Term Goals - 07/12/16 1441      PT SHORT TERM GOAL #1   Title Patient wil verbalize understanding and adhere to proper prosthesis wear schedule and prosthesis care. (TARGET DATE: 07/15/2016)   Baseline PT reset target date with delayed treatment with wound on limb / hospitalization   Time 5   Period Weeks   Status Achieved     PT SHORT TERM GOAL #2   Title Patient will verbalize understanding and demonstrate HEP activities and exercises. (TARGET DATE: 07/15/2016)   Baseline PT reset target date  with delayed treatment with wound on limb / hospitalization   Time 5   Period Weeks   Status Achieved     PT SHORT TERM GOAL #3   Title Patient will score >32/56 on Berg Balance to indicate a decrease in his risk of falling. (TARGET DATE: 07/15/2016)   Baseline 07/12/16: 49/56, indicating a moderate risk for falls   Time 5   Period Weeks   Status Achieved     PT SHORT TERM GOAL #4   Title Patient will demonstrate ability to ambulate on level, indoor surfaces for 250' with prosthesis and LRAD with supervision. (TARGET DATE: 07/15/2016)   Baseline 1000 feet on level, unlevel, indoor and outdoor surfaces for 1000 feet with prosthesis and no AD with supervision on 07/12/16   Time 5   Period Weeks   Status Achieved  PT Long Term Goals - 07/14/16 1554      PT LONG TERM GOAL #1   Title Patient will verbalize and demonstrate understanding of his ongoing HEP program and fitness. (TARGET DATE: 08/19/2016)   Baseline PT reset target date with delayed treatment with wound on limb / hospitalization   Time 10   Period Weeks   Status On-going     PT LONG TERM GOAL #2   Title Patient will score >44/56 on the Berg Balance test to indicate a decrease in his risk of falling. (TARGET DATE: 08/19/2016)   Baseline 07/12/16: 49/56, indicating a moderate risk for falls7/3/18: 49/56, indicating a moderate risk for falls   Time 10   Period Weeks   Status Achieved     PT LONG TERM GOAL #3   Title Patient will perform a TUG with prosthesis only in <13s to indicate a decrease in his risk of falling. (TARGET DATE: 08/19/2016)   Baseline PT reset target date with delayed treatment with wound on limb / hospitalization   Time 10   Period Weeks   Status On-going     PT LONG TERM GOAL #4   Title Patient will complete an Functional Gait Assessment with a score of >25 to indicate a decrease in his risk of falling. (TARGET DATE: 08/19/2016)   Baseline 07/14/2016 FGA score = 23/30    Time 10   Period Weeks    Status Revised     PT LONG TERM GOAL #5   Title Patient will ambulate 1000' on unlevel surfaces (outdoors, ramps, curbs, and grass) with LRAD and prosthesis. (TARGET DATE: 08/19/2016)   Baseline PT reset target date with delayed treatment with wound on limb / hospitalization   Time 10   Period Weeks   Status Partially Met     PT LONG TERM GOAL #6   Title Patient performs functional task of lifting, pushing, pulling with prosthesis safely.  (Target Date: 08/19/2016)   Time 10   Period Weeks   Status On-going               Plan - 08/08/16 2216    Clinical Impression Statement Patient has progressed high level activities with prosthesis. 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    Clinical Impairments Affecting Rehab Potential DM type 1, charcot foot, HTN, HLD   PT Frequency 2x / week   PT Duration Other (comment)   PT Treatment/Interventions ADLs/Self Care Home Management;DME Instruction;Gait training;Stair training;Functional mobility training;Therapeutic activities;Therapeutic exercise;Balance training;Prosthetic Training;Patient/family education;Neuromuscular re-education;Scar mobilization   PT Next Visit Plan assess LTGs and discharge   Consulted and Agree with Plan of Care Patient      Patient will benefit from skilled therapeutic intervention in order to improve the following deficits and impairments:  Abnormal gait, Decreased activity tolerance, Decreased balance, Decreased knowledge of precautions, Decreased knowledge of use of DME, Decreased mobility, Difficulty walking, Decreased strength, Decreased skin integrity, Postural dysfunction, Pain  Visit Diagnosis: Other abnormalities of gait and mobility  Muscle weakness (generalized)  Unsteadiness on feet     Problem List Patient Active Problem List   Diagnosis Date Noted  . Chronic fatigue 07/04/2016  . Impaired exercise tolerance 07/04/2016  . Acute  left otitis media 06/09/2016  . Hx of BKA, left - with subsequent cellulitis and treatment for osteomylitis 06/01/2016  . Abscess of left leg 04/26/2016  . Diabetes mellitus with complication ([place])   . Normocytic anemia 01/01/2016  . Osteomyelitis (  [place]) 12/31/2015  . Diabetic ulcer of left heel associated with type 1 diabetes mellitus, with necrosis of bone ([place]) 12/31/2015  . Ankle wound 12/31/2015  . Chronic osteomyelitis of left ankle with draining sinus ([place])   . Type 1 diabetes, uncontrolled, with Charcot's joint of foot ([place]) 10/12/2015  . Low HDL (under 40) 09/22/2015  . Obesity, Class III, BMI 40-49.9 (morbid obesity) ([place]) 09/22/2015  . H/O noncompliance with medical treatment, presenting hazards to health 09/22/2015  . Hx MRSA infection 09/22/2015  . Vitamin D deficiency 09/22/2015  . Diabetes mellitus type 1 ([place])   . Charcot's joint of foot 11/25/2013  . Essential hypertension 05/03/2013  . Hyperlipidemia 07/26/2012  . Diabetic ketoacidosis ([place])   . GERD (gastroesophageal reflux disease)   . Meniscus tear   . Epigastric pain 12/10/2011  . Vomiting 12/10/2011  . Problems influencing health status 11/21/2011    Jamey Reas PT, DPT 08/08/2016, 10:18 PM  [facility] [address] [place], [place], [address] Phone: [phone]   Fax:  [phone]  Name: Michael Hutchinson MRN: [id number] Date of Birth: 05/12/1975

## 2016-08-15 ENCOUNTER — Encounter: Payer: Self-pay | Admitting: Physical Therapy

## 2016-08-15 ENCOUNTER — Ambulatory Visit: Payer: BLUE CROSS/BLUE SHIELD | Attending: Orthopedic Surgery | Admitting: Physical Therapy

## 2016-08-15 DIAGNOSIS — R2689 Other abnormalities of gait and mobility: Secondary | ICD-10-CM | POA: Diagnosis not present

## 2016-08-15 DIAGNOSIS — M6281 Muscle weakness (generalized): Secondary | ICD-10-CM

## 2016-08-15 DIAGNOSIS — R2681 Unsteadiness on feet: Secondary | ICD-10-CM

## 2016-08-15 NOTE — Therapy (Signed)
[facility] [address] [place], [place], [address] Phone: [phone]   Fax:  [phone]  Physical Therapy Treatment  Patient Details  Name: Michael Hutchinson MRN: [id number] Date of Birth: May 20, 1975 Referring Provider: Dr. [name]  Encounter Date: 08/15/2016      PT End of Session - 08/15/16 1532    Visit Number 18   Number of Visits 21   Date for PT Re-Evaluation 08/19/16   Authorization Type BCBS 30 visit limit with 23 visits remaining   Authorization - Visit Number 18   Authorization - Number of Visits 23   PT Start Time 6812   PT Stop Time 1518   PT Time Calculation (min) 32 min   Activity Tolerance Patient tolerated treatment well   Behavior During Therapy [facility] for tasks assessed/performed      Past Medical History:  Diagnosis Date  . Charcot's joint of foot 11/25/2013  . Complication of anesthesia    "I wake up angry" (12/31/2015)  . Diabetes mellitus type 1 ([place]) dx'd 1981  . Diabetic ketoacidosis ([place])   . Essential hypertension 05/03/2013  . GERD (gastroesophageal reflux disease)   . High cholesterol   . Hx MRSA infection    Inner thigh and under arm- healed areas  . Meniscus tear   . Shortness of breath    with exertion only    Past Surgical History:  Procedure Laterality Date  . AMPUTATION Left 01/01/2016   Procedure: AMPUTATION BELOW KNEE;  Surgeon: [name], MD;  Location: [facility];  Service: Orthopedics;  Laterality: Left;  . APPLICATION OF WOUND VAC  04/26/2016  . FRACTURE SURGERY    . HERNIA REPAIR    . I&D EXTREMITY Left 04/26/2016   Procedure: IRRIGATION AND DEBRIDEMENT EXTREMITY/Left Leg/Possible Wound Vac;  Surgeon: [name], MD;  Location: [facility];  Service: Orthopedics;  Laterality: Left;  . INCISE AND DRAIN ABCESS Left 04/26/2016  . KNEE ARTHROSCOPY Left ~ 2010  . LAPAROSCOPIC CHOLECYSTECTOMY  2015  . METACARPOPHALANGEAL JOINT ARTHRODESIS Left 06/2012   Fracture left  index finger intra-articular MCP joint/notes 06/30/2012  . OPEN REDUCTION INTERNAL FIXATION (ORIF) PROXIMAL PHALANX Left 06/30/2012   Procedure: OPEN REDUCTION INTERNAL FIXATION (ORIF) LEFT INDEX FINGER PROXIMAL PHALANX FRACTURE WITH LIGAMENT REPAIR AS NECESSARY;  Surgeon: [name], MD;  Location: [facility];  Service: Orthopedics;  Laterality: Left;  . UMBILICAL HERNIA REPAIR  2015   "w/gallbladder OR"    There were no vitals filed for this visit.      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 Reports heat rash seems better.   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 No/denies            [facility] PT Assessment - 08/15/16 1512      Observation/Other Assessments   Focus on Therapeutic Outcomes (FOTO)  60.59% functional status  Initial 36.56% functional status   Activities of Balance Confidence Scale (ABC Scale)  94.4%  Previous 46.3%   Fear Avoidance  Belief Questionnaire (FABQ)  19 (5)  26 (8)     Timed Up and Go Test   TUG Normal TUG   Normal TUG (seconds) 7.49     Functional Gait  Assessment   Gait assessed  Yes   Gait Level Surface Walks 20 ft in less than 5.5 sec, no assistive devices, good speed, no evidence for imbalance, normal gait pattern, deviates no more than 6 in outside of the 12 in walkway width.   Change in Gait Speed Able to smoothly change walking speed without loss of balance or gait deviation. Deviate no more than 6 in outside of the 12 in walkway width.   Gait with Horizontal Head Turns Performs  head turns smoothly with no change in gait. Deviates no more than 6 in outside 12 in walkway width   Gait with Vertical Head Turns Performs head turns with no change in gait. Deviates no more than 6 in outside 12 in walkway width.   Gait and Pivot Turn Pivot turns safely within 3 sec and stops quickly with no loss of balance.   Step Over Obstacle Is able to step over 2 stacked shoe boxes taped together (9 in total height) without changing gait speed. No evidence of imbalance.   Gait with Narrow Base of Support Ambulates less than 4 steps heel to toe or cannot perform without assistance.   Gait with Eyes Closed Walks 20 ft, no assistive devices, good speed, no evidence of imbalance, normal gait pattern, deviates no more than 6 in outside 12 in walkway width. Ambulates 20 ft in less than 7 sec.   Ambulating Backwards Walks 20 ft, no assistive devices, good speed, no evidence for imbalance, normal gait   Steps Alternating feet, must use rail.   Total Score 26   FGA comment: 23/30 on 07/14/16                     [facility] Adult PT Treatment/Exercise - 08/15/16 1525      Therapeutic Activites    Lifting Pt able to independently lift and carry box with 50# to simulate work activities   Work Simulation Pt able to independently push and pull weight cart to simulate pushing/pulling work activities                PT Education - 08/15/16 1530    Education provided Yes   Education Details Pt educated on amputee support group and ongoing prosthetic care f/u with prosthetist   Person(s) Educated Patient   Methods Explanation   Comprehension Verbalized understanding          PT Short Term Goals - 07/12/16 1441      PT SHORT TERM GOAL #1   Title Patient wil verbalize understanding and adhere to proper prosthesis wear schedule and prosthesis care. (TARGET DATE: 07/15/2016)   Baseline PT reset target date with delayed treatment with wound on limb / hospitalization   Time 5   Period  Weeks   Status Achieved     PT SHORT TERM GOAL #2   Title Patient will verbalize understanding and demonstrate HEP activities and exercises. (TARGET DATE: 07/15/2016)   Baseline PT reset target date with delayed treatment with wound on limb / hospitalization   Time 5   Period Weeks   Status Achieved     PT SHORT TERM GOAL #3   Title Patient will score >32/56 on Berg Balance to indicate a decrease in his risk of falling. (TARGET DATE: 07/15/2016)  Baseline 07/12/16: 49/56, indicating a moderate risk for falls   Time 5   Period Weeks   Status Achieved     PT SHORT TERM GOAL #4   Title Patient will demonstrate ability to ambulate on level, indoor surfaces for 250' with prosthesis and LRAD with supervision. (TARGET DATE: 07/15/2016)   Baseline 1000 feet on level, unlevel, indoor and outdoor surfaces for 1000 feet with prosthesis and no AD with supervision on 07/12/16   Time 5   Period Weeks   Status Achieved           PT Long Term Goals - 08/15/16 1538      PT LONG TERM GOAL #1   Title Patient will verbalize and demonstrate understanding of his ongoing HEP program and fitness. (TARGET DATE: 08/19/2016)   Baseline GOAL MET 08/15/16   Time 10   Period Weeks   Status Achieved     PT LONG TERM GOAL #2   Title Patient will score >44/56 on the Berg Balance test to indicate a decrease in his risk of falling. (TARGET DATE: 08/19/2016)   Baseline GOAL MET 07/12/16: 49/56, indicating a moderate risk for falls   Time 10   Period Weeks   Status Achieved     PT LONG TERM GOAL #3   Title Patient will perform a TUG with prosthesis only in <13s to indicate a decrease in his risk of falling. (TARGET DATE: 08/19/2016)   Baseline GOAL MET 08/15/16: 7.49s indicating reduced risk of falls   Time 10   Period Weeks   Status Achieved     PT LONG TERM GOAL #4   Title Patient will complete an Functional Gait Assessment with a score of >25 to indicate a decrease in his risk of falling. (TARGET DATE: 08/19/2016)    Baseline GOAL MET 08/25/16: FGA score 26/30 indicating reduced risk of falls   Time 10   Period Weeks   Status Achieved     PT LONG TERM GOAL #5   Title Patient will ambulate 1000' on unlevel surfaces (outdoors, ramps, curbs, and grass) with LRAD and prosthesis. (TARGET DATE: 08/19/2016)   Baseline GOAL MET 08/15/16 without use of AD   Time 10   Period Weeks   Status Achieved     PT LONG TERM GOAL #6   Title Patient performs functional task of lifting, pushing, pulling with prosthesis safely.  (Target Date: 08/19/2016)   Baseline GOAL MET 08/15/16   Time 10   Period Weeks   Status Achieved               Plan - 08/15/16 1534    Clinical Impression Statement Patient has progressed to perform high level activities with prosthesis without LOB, including pushing/pulling/lifting and ambulation activities. TUG, FGA and activity goals met. Patient no longer requires skilled therapy and will be discharged with HEP and instructions to follow up with prosthetist for ongoing care.   Clinical Impairments Affecting Rehab Potential DM type 1, charcot foot, HTN, HLD   PT Frequency 2x / week   PT Duration --   PT Treatment/Interventions ADLs/Self Care Home Management;DME Instruction;Gait training;Stair training;Functional mobility training;Therapeutic activities;Therapeutic exercise;Balance training;Prosthetic Training;Patient/family education;Neuromuscular re-education;Scar mobilization   PT Next Visit Plan --   Consulted and Agree with Plan of Care Patient      Patient will benefit from skilled therapeutic intervention in order to improve the following deficits and impairments:  Abnormal gait, Decreased activity tolerance, Decreased balance, Decreased knowledge of precautions, Decreased knowledge of use  of DME, Decreased mobility, Difficulty walking, Decreased strength, Decreased skin integrity, Postural dysfunction, Pain  Visit Diagnosis: Other abnormalities of gait and mobility  Muscle  weakness (generalized)  Unsteadiness on feet     Problem List Patient Active Problem List   Diagnosis Date Noted  . Chronic fatigue 07/04/2016  . Impaired exercise tolerance 07/04/2016  . Acute left otitis media 06/09/2016  . Hx of BKA, left - with subsequent cellulitis and treatment for osteomylitis 06/01/2016  . Abscess of left leg 04/26/2016  . Diabetes mellitus with complication ([place])   . Normocytic anemia 01/01/2016  . Osteomyelitis ([place]) 12/31/2015  . Diabetic ulcer of left heel associated with type 1 diabetes mellitus, with necrosis of bone ([place]) 12/31/2015  . Ankle wound 12/31/2015  . Chronic osteomyelitis of left ankle with draining sinus ([place])   . Type 1 diabetes, uncontrolled, with Charcot's joint of foot ([place]) 10/12/2015  . Low HDL (under 40) 09/22/2015  . Obesity, Class III, BMI 40-49.9 (morbid obesity) ([place]) 09/22/2015  . H/O noncompliance with medical treatment, presenting hazards to health 09/22/2015  . Hx MRSA infection 09/22/2015  . Vitamin D deficiency 09/22/2015  . Diabetes mellitus type 1 ([place])   . Charcot's joint of foot 11/25/2013  . Essential hypertension 05/03/2013  . Hyperlipidemia 07/26/2012  . Diabetic ketoacidosis ([place])   . GERD (gastroesophageal reflux disease)   . Meniscus tear   . Epigastric pain 12/10/2011  . Vomiting 12/10/2011  . Problems influencing health status 11/21/2011   PHYSICAL THERAPY DISCHARGE SUMMARY  Visits from Start of Care: 18  Current functional level related to goals / functional outcomes: See above   Remaining deficits: See above Patient is able to safely perform work simulated tasks including lifting /carrying 50#, pushing/pulling weighted cart (>100#) and climbing A-frame ladder.    Education / Equipment: HEP & prosthetic care including work task.   Plan: Patient agrees to discharge.  Patient goals were met. Patient is being discharged due to meeting the stated rehab goals.  ?????         Gershon Crane, SPT 08/15/2016, 3:54 PM  Jamey Reas, PT, DPT PT Specializing in [place] 08/15/16 4:37 PM Phone:  [phone]  Fax:  [phone] [facility] [address] [place], [place] [address]  [facility] [address] [place], [place], [address] Phone: [phone]   Fax:  [phone]  Name: AHMOD GILLESPIE MRN: [id number] Date of Birth: 1975-03-04

## 2016-08-17 DIAGNOSIS — Z4789 Encounter for other orthopedic aftercare: Secondary | ICD-10-CM | POA: Diagnosis not present

## 2016-08-17 DIAGNOSIS — Z89512 Acquired absence of left leg below knee: Secondary | ICD-10-CM | POA: Diagnosis not present

## 2016-08-23 ENCOUNTER — Telehealth: Payer: Self-pay | Admitting: *Deleted

## 2016-08-24 ENCOUNTER — Ambulatory Visit: Payer: BLUE CROSS/BLUE SHIELD | Admitting: *Deleted

## 2016-08-24 ENCOUNTER — Ambulatory Visit: Payer: BLUE CROSS/BLUE SHIELD | Admitting: Endocrinology

## 2016-08-24 NOTE — Telephone Encounter (Signed)
error 

## 2016-09-02 ENCOUNTER — Ambulatory Visit: Payer: BLUE CROSS/BLUE SHIELD | Admitting: Endocrinology

## 2016-09-13 DIAGNOSIS — Z89512 Acquired absence of left leg below knee: Secondary | ICD-10-CM | POA: Diagnosis not present

## 2016-09-28 ENCOUNTER — Ambulatory Visit: Payer: BLUE CROSS/BLUE SHIELD | Admitting: Endocrinology

## 2016-09-29 ENCOUNTER — Ambulatory Visit: Payer: BLUE CROSS/BLUE SHIELD | Admitting: Internal Medicine

## 2016-10-19 ENCOUNTER — Encounter: Payer: Self-pay | Admitting: Endocrinology

## 2016-10-19 ENCOUNTER — Ambulatory Visit (INDEPENDENT_AMBULATORY_CARE_PROVIDER_SITE_OTHER): Payer: BLUE CROSS/BLUE SHIELD | Admitting: Endocrinology

## 2016-10-19 VITALS — BP 139/109 | HR 116 | Wt 321.6 lb

## 2016-10-19 DIAGNOSIS — E1065 Type 1 diabetes mellitus with hyperglycemia: Secondary | ICD-10-CM | POA: Diagnosis not present

## 2016-10-19 DIAGNOSIS — E10610 Type 1 diabetes mellitus with diabetic neuropathic arthropathy: Secondary | ICD-10-CM

## 2016-10-19 DIAGNOSIS — IMO0002 Reserved for concepts with insufficient information to code with codable children: Secondary | ICD-10-CM

## 2016-10-19 LAB — POCT GLYCOSYLATED HEMOGLOBIN (HGB A1C): Hemoglobin A1C: 10.7

## 2016-10-19 MED ORDER — GLUCOSE BLOOD VI STRP: 1.0000 | ORAL_STRIP | Freq: Four times a day (QID) | 3 refills | Status: AC

## 2016-10-19 NOTE — Patient Instructions (Addendum)
Please see Bonita Quin, to consider a pump. For now, please change the lantus to the morning, at the same amount. check your blood sugar 4 times a day: before the 3 meals, and at bedtime.  also check if you have symptoms of your blood sugar being too high or too low.  please keep a record of the readings and bring it to your next appointment here (or you can bring the meter itself).  You can write it on any piece of paper.  please call us sooner if your blood sugar goes below 70, or if you have a lot of readings over 200.   If this does not get rid of the morning lows, we'll change to a faster-acting insulin.   Please call or message [place] next week, to tell us how the blood sugar is doing.   Please come back for a follow-up appointment in 2-3 weeks.

## 2016-10-19 NOTE — Progress Notes (Signed)
Subjective:    Patient ID: Michael Hutchinson, male    DOB: 1975/03/31, 41 y.o.   MRN: [id number]  HPI Pt returns for f/u of diabetes mellitus: DM type: 1 Dx'ed: 1031 Complications: left BKA, and right charcot foot.  Therapy: insulin since dx DKA: once, in 2009 Severe hypoglycemia: once, in 2008 Pancreatitis: never Pancreatic imaging: never Other: he takes lantus and PRN humalog Interval history: pt says he seldom checks checks cbg He takes lantus 40 QHS, and prn humalog (averages approx 35 units total per day).  no cbg record, but states cbg's are 200 at HS and low fasting.  He requests a pump.   Past Medical History:  Diagnosis Date  . Charcot's joint of foot 11/25/2013  . Complication of anesthesia    "I wake up angry" (12/31/2015)  . Diabetes mellitus type 1 ([place]) dx'd 1981  . Diabetic ketoacidosis ([place])   . Essential hypertension 05/03/2013  . GERD (gastroesophageal reflux disease)   . High cholesterol   . Hx MRSA infection    Inner thigh and under arm- healed areas  . Meniscus tear   . Shortness of breath    with exertion only    Past Surgical History:  Procedure Laterality Date  . AMPUTATION Left 01/01/2016   Procedure: AMPUTATION BELOW KNEE;  Surgeon: [name], MD;  Location: [facility];  Service: Orthopedics;  Laterality: Left;  . APPLICATION OF WOUND VAC  04/26/2016  . FRACTURE SURGERY    . HERNIA REPAIR    . I&D EXTREMITY Left 04/26/2016   Procedure: IRRIGATION AND DEBRIDEMENT EXTREMITY/Left Leg/Possible Wound Vac;  Surgeon: [name], MD;  Location: [facility];  Service: Orthopedics;  Laterality: Left;  . INCISE AND DRAIN ABCESS Left 04/26/2016  . KNEE ARTHROSCOPY Left ~ 2010  . LAPAROSCOPIC CHOLECYSTECTOMY  2015  . METACARPOPHALANGEAL JOINT ARTHRODESIS Left 06/2012   Fracture left index finger intra-articular MCP joint/notes 06/30/2012  . OPEN REDUCTION INTERNAL FIXATION (ORIF) PROXIMAL PHALANX Left 06/30/2012   Procedure: OPEN REDUCTION INTERNAL FIXATION (ORIF)  LEFT INDEX FINGER PROXIMAL PHALANX FRACTURE WITH LIGAMENT REPAIR AS NECESSARY;  Surgeon: [name], MD;  Location: [facility];  Service: Orthopedics;  Laterality: Left;  . UMBILICAL HERNIA REPAIR  2015   "w/gallbladder OR"    Social History   Social History  . Marital status: Married    Spouse name: N/A  . Number of children: N/A  . Years of education: N/A   Occupational History  . Not on file.   Social History Main Topics  . Smoking status: Never Smoker  . Smokeless tobacco: Former Systems developer    Types: Snuff, Chew    Quit date: 08/25/2010  . Alcohol use 8.4 oz/week    14 Cans of beer per week  . Drug use: No  . Sexual activity: Yes   Other Topics Concern  . Not on file   Social History Narrative  . No narrative on file    Current Outpatient Prescriptions on File Prior to Visit  Medication Sig Dispense Refill  . atorvastatin (LIPITOR) 40 MG tablet TAKE 1 TABLET BY MOUTH DAILY AT 6 PM. 30 tablet 0  . BAYER MICROLET LANCETS lancets Use as instructed to check 3 times daily. 100 each 12  . Blood Glucose Monitoring Suppl (ONE TOUCH ULTRA SYSTEM KIT) w/Device KIT 1 kit by Does not apply route once. Check blood sugar 3 times daily. Diagnosis Diabetes ICD-10 E11.8    . glucagon (GLUCAGON EMERGENCY) 1 MG injection Inject 1 mg into the  muscle once as needed. 1 each 11  . [name] 100 UNIT/ML KiwkPen INJECT 3 TO 20 UNITS INTO THE SKIN 3 TIMES A DAY BEFORE MEALS. SLIDING SCALE PER CARB COUNT OF 4 UNITS FOR EVERY 30 CARBS 15 mL 1  . ibuprofen (ADVIL,MOTRIN) 800 MG tablet Take 1 tablet (800 mg total) by mouth 3 (three) times daily. 21 tablet 0  . insulin glargine (LANTUS) 100 UNIT/ML injection Inject 40 units at bedtime 500 mL 1  . insulin lispro (HUMALOG) 100 UNIT/ML injection Inject 0.03-0.2 mLs (3-20 Units total) into the skin 3 (three) times daily before meals. Sliding scale per carb count 4 units for every 30 carbs 10 mL 2  . Insulin Pen Needle (BD PEN NEEDLE NANO U/F) 32G X 4 MM  MISC 1 pen by Does not apply route 3 (three) times daily.    . losartan (COZAAR) 25 MG tablet Take 1 tablet (25 mg total) by mouth 2 (two) times daily. 90 tablet 1   No current facility-administered medications on file prior to visit.     Allergies  Allergen Reactions  . Influenza Vac Split Quad Nausea And Vomiting  . Hydrochlorothiazide     Dizziness  . Lisinopril Cough  . Losartan     Dizziness    Family History  Problem Relation Age of Onset  . Other Mother   . Cancer Mother        Breast / Bone  . Heart attack Father   . Hypertension Father   . Hyperlipidemia Father   . Diabetes Unknown   . Alcohol abuse Sister   . Diabetes Maternal Grandfather   . Stroke Paternal Grandmother   . Alcohol abuse Paternal Grandfather     BP (!) 139/109 (BP Location: Left Arm, Cuff Size: Large)   Pulse (!) 116   Wt (!) 321 lb 9.6 oz (145.9 kg)   SpO2 98%   BMI 46.14 kg/m    Review of Systems Denies LOC    Objective:   Physical Exam VITAL SIGNS:  See vs page GENERAL: no distress Pulses: right foot pulses are intact.   MSK: no deformity of the right foot or ankle  CV: 1+ edema of the right leg Skin:  no ulcer on the right foot or ankle.  normal color and temp on the feet and ankles.  Neuro: sensation is intact to touch on the feet and ankles, but decreased from normal.  Ext: left BKA  Right ankle deformity is noted.    Lab Results  Component Value Date   HGBA1C 10.7 10/19/2016      Assessment & Plan:  Type 1 DM, new to me.  Poor glycemic control.     Patient Instructions  Please see Linda, to consider a pump. For now, please change the lantus to the morning, at the same amount. check your blood sugar 4 times a day: before the 3 meals, and at bedtime.  also check if you have symptoms of your blood sugar being too high or too low.  please keep a record of the readings and bring it to your next appointment here (or you can bring the meter itself).  You can write it on any  piece of paper.  please call us sooner if your blood sugar goes below 70, or if you have a lot of readings over 200.   If this does not get rid of the morning lows, we'll change to a faster-acting insulin.   Please call or message us next   week, to tell us how the blood sugar is doing.   Please come back for a follow-up appointment in 2-3 weeks.

## 2016-10-28 ENCOUNTER — Other Ambulatory Visit (INDEPENDENT_AMBULATORY_CARE_PROVIDER_SITE_OTHER): Payer: BLUE CROSS/BLUE SHIELD

## 2016-10-28 DIAGNOSIS — E1010 Type 1 diabetes mellitus with ketoacidosis without coma: Secondary | ICD-10-CM

## 2016-10-28 DIAGNOSIS — E7849 Other hyperlipidemia: Secondary | ICD-10-CM | POA: Diagnosis not present

## 2016-10-28 DIAGNOSIS — E1029 Type 1 diabetes mellitus with other diabetic kidney complication: Secondary | ICD-10-CM

## 2016-10-28 DIAGNOSIS — E559 Vitamin D deficiency, unspecified: Secondary | ICD-10-CM

## 2016-10-28 DIAGNOSIS — R809 Proteinuria, unspecified: Secondary | ICD-10-CM

## 2016-10-28 DIAGNOSIS — R5382 Chronic fatigue, unspecified: Secondary | ICD-10-CM

## 2016-10-31 LAB — CBC WITH DIFFERENTIAL/PLATELET
BASOS: 0 %
Basophils Absolute: 0 10*3/uL (ref 0.0–0.2)
EOS (ABSOLUTE): 0.2 10*3/uL (ref 0.0–0.4)
Eos: 3 %
Hematocrit: 39.5 % (ref 37.5–51.0)
Hemoglobin: 13.2 g/dL (ref 13.0–17.7)
IMMATURE GRANULOCYTES: 1 %
Immature Grans (Abs): 0.1 10*3/uL (ref 0.0–0.1)
LYMPHS ABS: 1.2 10*3/uL (ref 0.7–3.1)
Lymphs: 19 %
MCH: 30.5 pg (ref 26.6–33.0)
MCHC: 33.4 g/dL (ref 31.5–35.7)
MCV: 91 fL (ref 79–97)
MONOS ABS: 0.6 10*3/uL (ref 0.1–0.9)
Monocytes: 10 %
NEUTROS PCT: 67 %
Neutrophils Absolute: 4.3 10*3/uL (ref 1.4–7.0)
PLATELETS: 289 10*3/uL (ref 150–379)
RBC: 4.33 x10E6/uL (ref 4.14–5.80)
RDW: 13 % (ref 12.3–15.4)
WBC: 6.4 10*3/uL (ref 3.4–10.8)

## 2016-10-31 LAB — COMPREHENSIVE METABOLIC PANEL
ALBUMIN: 4.2 g/dL (ref 3.5–5.5)
ALT: 14 IU/L (ref 0–44)
AST: 15 IU/L (ref 0–40)
Albumin/Globulin Ratio: 1.6 (ref 1.2–2.2)
Alkaline Phosphatase: 97 IU/L (ref 39–117)
BUN / CREAT RATIO: 17 (ref 9–20)
BUN: 20 mg/dL (ref 6–20)
Bilirubin Total: 0.5 mg/dL (ref 0.0–1.2)
CO2: 24 mmol/L (ref 20–29)
CREATININE: 1.16 mg/dL (ref 0.76–1.27)
Calcium: 9.2 mg/dL (ref 8.7–10.2)
Chloride: 96 mmol/L (ref 96–106)
GFR calc non Af Amer: 79 mL/min/{1.73_m2} (ref >59)
GFR, EST AFRICAN AMERICAN: 91 mL/min/{1.73_m2} (ref >59)
GLUCOSE: 251 mg/dL — AB (ref 65–99)
Globulin, Total: 2.6 g/dL (ref 1.5–4.5)
Potassium: 4.6 mmol/L (ref 3.5–5.2)
Sodium: 134 mmol/L (ref 134–144)
TOTAL PROTEIN: 6.8 g/dL (ref 6.0–8.5)

## 2016-10-31 LAB — LIPID PANEL
CHOLESTEROL TOTAL: 218 mg/dL — AB (ref 100–199)
Chol/HDL Ratio: 5.1 ratio — ABNORMAL HIGH (ref 0.0–5.0)
HDL: 43 mg/dL (ref >39)
LDL CALC: 147 mg/dL — AB (ref 0–99)
Triglycerides: 140 mg/dL (ref 0–149)
VLDL CHOLESTEROL CAL: 28 mg/dL (ref 5–40)

## 2016-10-31 LAB — TESTOSTERONE,FREE AND TOTAL
Testosterone, Free: 9.9 pg/mL (ref 8.7–25.1)
Testosterone: 479 ng/dL (ref 264–916)

## 2016-10-31 LAB — TSH: TSH: 1.87 u[IU]/mL (ref 0.450–4.500)

## 2016-10-31 LAB — HEMOGLOBIN A1C
Est. average glucose Bld gHb Est-mCnc: 266 mg/dL
HEMOGLOBIN A1C: 10.9 % — AB (ref 4.8–5.6)

## 2016-10-31 LAB — VITAMIN D 25 HYDROXY (VIT D DEFICIENCY, FRACTURES): VIT D 25 HYDROXY: 15.2 ng/mL — AB (ref 30.0–100.0)

## 2016-11-03 ENCOUNTER — Ambulatory Visit: Payer: BLUE CROSS/BLUE SHIELD | Admitting: Endocrinology

## 2016-11-08 ENCOUNTER — Encounter: Payer: BLUE CROSS/BLUE SHIELD | Attending: Endocrinology | Admitting: Nutrition

## 2016-11-08 DIAGNOSIS — IMO0002 Reserved for concepts with insufficient information to code with codable children: Secondary | ICD-10-CM

## 2016-11-08 DIAGNOSIS — E1065 Type 1 diabetes mellitus with hyperglycemia: Secondary | ICD-10-CM

## 2016-11-08 DIAGNOSIS — E10610 Type 1 diabetes mellitus with diabetic neuropathic arthropathy: Secondary | ICD-10-CM

## 2016-11-08 NOTE — Patient Instructions (Signed)
Read all literature given on pump therapy and different models of insulin pumps Call if questions

## 2016-11-08 NOTE — Progress Notes (Signed)
Discussed insulin pump therayp with patient and his wife.  He is very interested in this.  We discussed the 3 pumps and the advantages and disadvantages of each pump model.  He chose the [employer]onic.  Paperwork was filled out and faxed into [facility].  He was given brochures of each pump and told to go on the Internet to review his each one.  He agreed to do this.   Also reviewed low blood sugar treatments.  He is treating this with peanut butter.

## 2016-11-10 ENCOUNTER — Ambulatory Visit (INDEPENDENT_AMBULATORY_CARE_PROVIDER_SITE_OTHER): Payer: BLUE CROSS/BLUE SHIELD | Admitting: Endocrinology

## 2016-11-10 ENCOUNTER — Encounter: Payer: Self-pay | Admitting: Endocrinology

## 2016-11-10 VITALS — BP 136/84 | HR 78 | Ht 70.0 in | Wt 324.0 lb

## 2016-11-10 DIAGNOSIS — E1065 Type 1 diabetes mellitus with hyperglycemia: Secondary | ICD-10-CM

## 2016-11-10 DIAGNOSIS — E10610 Type 1 diabetes mellitus with diabetic neuropathic arthropathy: Secondary | ICD-10-CM | POA: Diagnosis not present

## 2016-11-10 DIAGNOSIS — IMO0002 Reserved for concepts with insufficient information to code with codable children: Secondary | ICD-10-CM

## 2016-11-10 MED ORDER — INSULIN LISPRO 100 UNIT/ML (KWIKPEN): 0.0000 [IU] | PEN_INJECTOR | Freq: Three times a day (TID) | SUBCUTANEOUS | 1 refills | Status: DC

## 2016-11-10 MED ORDER — INSULIN GLARGINE 100 UNIT/ML SOLOSTAR PEN: 60.0000 [IU] | PEN_INJECTOR | SUBCUTANEOUS | 99 refills | Status: DC

## 2016-11-10 NOTE — Patient Instructions (Addendum)
For now, please increase the lantus to 60 units each morning, and: Take humalog just 2 units for any blood sugar in the 200's, and 5 units if it is over 300.   check your blood sugar 4 times a day: before the 3 meals, and at bedtime.  also check if you have symptoms of your blood sugar being too high or too low.  please keep a record of the readings and bring it to your next appointment here (or you can bring the meter itself).  You can write it on any piece of paper.  please call us sooner if your blood sugar goes below 70, or if you have a lot of readings over 200.   Please call or message [place] next week, to tell us how the blood sugar is doing.   Please come back for a follow-up appointment in 1 month.

## 2016-11-10 NOTE — Progress Notes (Signed)
Subjective:    Patient ID: Michael Hutchinson, male    DOB: [birth year], 41 y.o.   MRN: [id number]  HPI Pt returns for f/u of diabetes mellitus: DM type: 1 Dx'ed: 0300 Complications: left BKA, polyneuropathy, renal insuff, PAD, and right charcot foot.  Therapy: insulin since dx DKA: once, in 2009 Severe hypoglycemia: once, in 2008.  Pancreatitis: never Pancreatic imaging: never Other: he takes lantus and PRN humalog Interval history: pt says he seldom checks checks cbg; he is considering pump rx.   He takes lantus 40 QAM, and prn humalog (averages approx 30 units total per day).  no cbg record, but states cbg's vary from 125-200's.   Past Medical History:  Diagnosis Date  . Charcot's joint of foot 11/25/2013  . Complication of anesthesia    "I wake up angry" (12/31/2015)  . Diabetes mellitus type 1 ([place]) dx'd 1981  . Diabetic ketoacidosis ([place])   . Essential hypertension 05/03/2013  . GERD (gastroesophageal reflux disease)   . High cholesterol   . Hx MRSA infection    Inner thigh and under arm- healed areas  . Meniscus tear   . Shortness of breath    with exertion only    Past Surgical History:  Procedure Laterality Date  . AMPUTATION Left 01/01/2016   Procedure: AMPUTATION BELOW KNEE;  Surgeon: [name], MD;  Location: [facility];  Service: Orthopedics;  Laterality: Left;  . APPLICATION OF WOUND VAC  04/26/2016  . FRACTURE SURGERY    . HERNIA REPAIR    . I&D EXTREMITY Left 04/26/2016   Procedure: IRRIGATION AND DEBRIDEMENT EXTREMITY/Left Leg/Possible Wound Vac;  Surgeon: [name], MD;  Location: [facility];  Service: Orthopedics;  Laterality: Left;  . INCISE AND DRAIN ABCESS Left 04/26/2016  . KNEE ARTHROSCOPY Left ~ 2010  . LAPAROSCOPIC CHOLECYSTECTOMY  2015  . METACARPOPHALANGEAL JOINT ARTHRODESIS Left 06/2012   Fracture left index finger intra-articular MCP joint/notes 06/30/2012  . OPEN REDUCTION INTERNAL FIXATION (ORIF) PROXIMAL PHALANX Left 06/30/2012   Procedure: OPEN  REDUCTION INTERNAL FIXATION (ORIF) LEFT INDEX FINGER PROXIMAL PHALANX FRACTURE WITH LIGAMENT REPAIR AS NECESSARY;  Surgeon: [name], MD;  Location: [facility];  Service: Orthopedics;  Laterality: Left;  . UMBILICAL HERNIA REPAIR  2015   "w/gallbladder OR"    Social History   Social History  . Marital status: Married    Spouse name: N/A  . Number of children: N/A  . Years of education: N/A   Occupational History  . Not on file.   Social History Main Topics  . Smoking status: Never Smoker  . Smokeless tobacco: Former Systems developer    Types: Snuff, Chew    Quit date: 08/25/2010  . Alcohol use 8.4 oz/week    14 Cans of beer per week  . Drug use: No  . Sexual activity: Yes   Other Topics Concern  . Not on file   Social History Narrative  . No narrative on file    Current Outpatient Prescriptions on File Prior to Visit  Medication Sig Dispense Refill  . atorvastatin (LIPITOR) 40 MG tablet TAKE 1 TABLET BY MOUTH DAILY AT 6 PM. 30 tablet 0  . BAYER MICROLET LANCETS lancets Use as instructed to check 3 times daily. 100 each 12  . Blood Glucose Monitoring Suppl (ONE TOUCH ULTRA SYSTEM KIT) w/Device KIT 1 kit by Does not apply route once. Check blood sugar 3 times daily. Diagnosis Diabetes ICD-10 E11.8    . glucagon (GLUCAGON EMERGENCY) 1 MG injection Inject 1  mg into the muscle once as needed. 1 each 11  . glucose blood (BAYER CONTOUR NEXT TEST) test strip 1 each by Other route 4 (four) times daily. And lancets 4/day 400 each 3  . ibuprofen (ADVIL,MOTRIN) 800 MG tablet Take 1 tablet (800 mg total) by mouth 3 (three) times daily. 21 tablet 0  . insulin glargine (LANTUS) 100 UNIT/ML injection Inject 40 units at bedtime 500 mL 1  . Insulin Pen Needle (BD PEN NEEDLE NANO U/F) 32G X 4 MM MISC 1 pen by Does not apply route 3 (three) times daily.    Marland Kitchen losartan (COZAAR) 25 MG tablet Take 1 tablet (25 mg total) by mouth 2 (two) times daily. 90 tablet 1   No current facility-administered medications  on file prior to visit.     Allergies  Allergen Reactions  . Influenza Vac Split Quad Nausea And Vomiting  . Hydrochlorothiazide     Dizziness  . Lisinopril Cough  . Losartan     Dizziness    Family History  Problem Relation Age of Onset  . Other Mother   . Cancer Mother        Breast / Bone  . Heart attack Father   . Hypertension Father   . Hyperlipidemia Father   . Diabetes Unknown   . Alcohol abuse Sister   . Diabetes Maternal Grandfather   . Stroke Paternal Grandmother   . Alcohol abuse Paternal Grandfather     BP 136/84   Pulse 78   Ht '5\' 10"'$  (1.778 m)   Wt (!) 324 lb (147 kg)   SpO2 97%   BMI 46.49 kg/m    Review of Systems He denies hypoglycemia    Objective:   Physical Exam VITAL SIGNS:  See vs page GENERAL: no distress Pulses: right foot pulses are intact.   MSK: no deformity of the right foot or ankle  CV: 2+ edema of the right leg. Skin:  no ulcer on the right foot or ankle.  normal color and temp on the feet and ankles.  Neuro: sensation is intact to touch on the feet and ankles, but decreased from normal.  Ext: left BKA  Right ankle deformity is noted.     Lab Results  Component Value Date   HGBA1C 10.9 (H) 10/28/2016   Lab Results  Component Value Date   CREATININE 1.16 10/28/2016   BUN 20 10/28/2016   NA 134 10/28/2016   K 4.6 10/28/2016   CL 96 10/28/2016   CO2 24 10/28/2016      Assessment & Plan:  type 1 DM, with PAD: he needs increased rx   Patient Instructions  For now, please increase the lantus to 60 units each morning, and: Take humalog just 2 units for any blood sugar in the 200's, and 5 units if it is over 300.   check your blood sugar 4 times a day: before the 3 meals, and at bedtime.  also check if you have symptoms of your blood sugar being too high or too low.  please keep a record of the readings and bring it to your next appointment here (or you can bring the meter itself).  You can write it on any piece of paper.   please call us sooner if your blood sugar goes below 70, or if you have a lot of readings over 200.   Please call or message [place] next week, to tell us how the blood sugar is doing.   Please come back  for a follow-up appointment in 1 month.

## 2016-11-14 ENCOUNTER — Telehealth: Payer: Self-pay | Admitting: Endocrinology

## 2016-11-14 NOTE — Telephone Encounter (Signed)
U called [employer] & the have no record of this patient? I stated if I run across the paperwork I will gladly fill it out & have it signed. There must be some type of patient or communication error here.

## 2016-11-14 NOTE — Telephone Encounter (Signed)
Linda: Do you know status of this?  Please let me know.

## 2016-11-14 NOTE — Telephone Encounter (Signed)
shelina from [employer] is calling on the status of the certificate of necessity that was faxed over. Please advise

## 2016-11-14 NOTE — Telephone Encounter (Signed)
Patient stated [employer] need a doctors note and prescription for the pump. Please advise

## 2016-11-15 NOTE — Telephone Encounter (Signed)
Thank you :)

## 2016-11-15 NOTE — Telephone Encounter (Signed)
[facility] have [facility] [employer] insulin pump.  I will look for it and have them resend it, if I can not find it.

## 2016-11-24 DIAGNOSIS — E109 Type 1 diabetes mellitus without complications: Secondary | ICD-10-CM | POA: Diagnosis not present

## 2016-11-24 DIAGNOSIS — Z794 Long term (current) use of insulin: Secondary | ICD-10-CM | POA: Diagnosis not present

## 2016-11-28 ENCOUNTER — Other Ambulatory Visit: Payer: Self-pay | Admitting: Internal Medicine

## 2016-12-05 ENCOUNTER — Encounter: Payer: BLUE CROSS/BLUE SHIELD | Attending: Endocrinology | Admitting: Nutrition

## 2016-12-05 DIAGNOSIS — E118 Type 2 diabetes mellitus with unspecified complications: Secondary | ICD-10-CM

## 2016-12-05 NOTE — Patient Instructions (Signed)
Test blood sugars before meals, bedtime. Calibrate the sensor in 2 hours, again in 4 hours and then every 8 hours.   Call off if problems with blood sugar readings.

## 2016-12-05 NOTE — Progress Notes (Signed)
Pt. And his wife are here to learn/start the Medtronic pump.  He did not take his Lantus this AM. His pump had date/time set correctly before coming in.   He was trained on how to fill a reservoir, prime the tubing, and insert the Quick Set .  He re demonstrated this correctly and filled his reservoir with Humalog insulin, and inserted the sensor into his upper left abdomen.  His blood sugar was 306, but he had 7u of insulin on board from the morning Humalog injection.  Settings were put into the pump by the patient, per Dr. George Hugh order:  Basal rate: 1.0u/hr, I/C ratio: 20, ISF: 50, timing 4 hours, and target 120 (per patient's request).   He was also trained on the sensor, and one was inserted into his left upper arm.  He was shown how/when to calibrate and reported good understanding of this.   He reported that he is very familiar with carb counting, and that he did not need any further teaching.  He re demonstrated how to bolus and calibrate sensor X2 correctly, and had not final questions. He was told to test ac and HS, and to calibrate sensor in 2 hours, and again in 4 hours, and then once every 8 hours.  He reported good understanding of this and had no final questions.   He was told to call the office if problems with low blood sugars, or high, before 5PM today.  I will call him tonight.

## 2016-12-06 ENCOUNTER — Encounter: Payer: BLUE CROSS/BLUE SHIELD | Admitting: Nutrition

## 2016-12-06 ENCOUNTER — Telehealth: Payer: Self-pay | Admitting: Nutrition

## 2016-12-06 DIAGNOSIS — E118 Type 2 diabetes mellitus with unspecified complications: Secondary | ICD-10-CM

## 2016-12-06 NOTE — Telephone Encounter (Signed)
Patients low blood sugar alarm when off from 11:45 PM until 4AM, saying that he is going low.  Blood sugars were between 75 and 85 until 6AM.  His basal rate is 1.0u/hr

## 2016-12-06 NOTE — Patient Instructions (Signed)
Test blood sugar before meals and do calibrations every 8 hours

## 2016-12-06 NOTE — Telephone Encounter (Signed)
Linda and I discussed.  Please decrease basal to 0.8 units/hr

## 2016-12-06 NOTE — Progress Notes (Signed)
Patient reported no difficulty giving boluses yesterday or this AM.  He reports that blood sugars alarmed "all night" last night.  Alarm history shows alert before alarm went off 3 times.  Pt. Did not treat the low, because blood sugar was 101.  Explained that this means that it is trending low, and will go low if he does not treat it.  Suggested 15 grams of carb-with amounts of juices, and soda, in stead of the can of soda he drank at 4AM.   We also reviewed sick day guidelines, high blood sugar protocols, and temp basal rates-when and how to do this.  He re demonstrated what to do for this correctly, and had no final questions.  He signed the checklist and will be here tomorrow for cartridge and tubing change.   He reported not difficulty disconnecting from the pump, sleeping or wear it yesterday  He is please with this. Per Dr. George Hugh voice order, that was reverbalized, he changed his own basal rate to 0.8u/hr.  He is currently on 60% less insulin, and is pleased with the blood sugar control.  Explained the importance of bolusing whenever he eats, and also explalined how to do correction boluses, and how and when to do a manual bolus.  He re demonstrated each of these correctly and had no final questions.  He was given handouts on high blood sugar protocol, sick days, and fast food carb booklet and sheet with 15 gram carb portions.  Marland Kitchen

## 2016-12-07 ENCOUNTER — Encounter: Payer: BLUE CROSS/BLUE SHIELD | Admitting: Nutrition

## 2016-12-07 NOTE — Progress Notes (Signed)
Pt. Is here with his wife.  He did a cartridge and tubing change with little assistance from me.  Pump download shows blood sugars in the low 200s during the day, but dropped to 80 acS.  HS reading was 165, and FBS today was 187. Per Dr. Everardo All request, no changes were made to the pump settings.  We reviewed high blood sugar protocols, and patient had no final questions.

## 2016-12-07 NOTE — Patient Instructions (Signed)
Calibrate sensor every 8 hours Change infusion sets every 3 days, and sensor every 7 days.   Call if questions.

## 2016-12-08 ENCOUNTER — Telehealth: Payer: Self-pay | Admitting: Endocrinology

## 2016-12-08 NOTE — Telephone Encounter (Signed)
I called and spoke with patient's wife & advised her to call the 1-800 help line number for [employer]tr[employer]. I also advised that he may want to change infusion site as well as take a manual injection to get blood sugars down.

## 2016-12-08 NOTE — Telephone Encounter (Signed)
Patient got a new pump on Monday-trained a couple days later. Wife does not think pump is working. His blood sugar is 300+. Please call ph# [phone] asap.

## 2016-12-08 NOTE — Telephone Encounter (Signed)
How should I advise this patient since Bonita Quin is out of office? I know she set him up with is pump earlier in the week?

## 2016-12-08 NOTE — Telephone Encounter (Signed)
Best I can offer is to suggest calling the toll-free cust service number that came with the pump.

## 2016-12-12 ENCOUNTER — Ambulatory Visit (INDEPENDENT_AMBULATORY_CARE_PROVIDER_SITE_OTHER): Payer: BLUE CROSS/BLUE SHIELD | Admitting: Endocrinology

## 2016-12-12 ENCOUNTER — Encounter: Payer: Self-pay | Admitting: Endocrinology

## 2016-12-12 VITALS — BP 149/125 | HR 100 | Wt 304.0 lb

## 2016-12-12 DIAGNOSIS — E10610 Type 1 diabetes mellitus with diabetic neuropathic arthropathy: Secondary | ICD-10-CM | POA: Diagnosis not present

## 2016-12-12 DIAGNOSIS — IMO0002 Reserved for concepts with insufficient information to code with codable children: Secondary | ICD-10-CM

## 2016-12-12 DIAGNOSIS — E1065 Type 1 diabetes mellitus with hyperglycemia: Secondary | ICD-10-CM

## 2016-12-12 NOTE — Progress Notes (Signed)
Subjective:    Patient ID: Michael Hutchinson, male    DOB: 1975-05-09, 41 y.o.   MRN: [id number]  HPI Pt returns for f/u of diabetes mellitus: DM type: 1 Dx'ed: 7824 Complications: left BKA, polyneuropathy, renal insuff, PAD, and right charcot foot.  Therapy: insulin since dx, and pump rx since 2018 DKA: once, in 2009 Severe hypoglycemia: once, in 2008.  Pancreatitis: never Pancreatic imaging: never Other: he takes lantus and PRN humalog Interval history:  He takes these settings:  basal rate of 1.0 units/hr, except 1.4/HR, 12 MN-5 AM, and 1.6/HR, 5 AM-8 AM.   mealtime bolus of 1 unit/7.5 grams carbohydrate.  correction bolus (which some people call "sensitivity," or "insulin sensitivity ratio," or just "isr") of 1 unit for each 33 by which your glucose exceeds 120.  Total daily dosage: approx 55 units per day.  Pt called last week, saying cbg's was over 300.  He said then that pump may not be working.  Looking back, he says infusion set may have been kinked.  He says cbg's are better now.  He has continuous glucose monitor, but does not upload, as he does not have computer.  Our office has downloaded continuous glucose monitor data, and we reviewed together.  It varies from 80-250.  It is lowest in the middle of the night, and highest after a meal.   Past Medical History:  Diagnosis Date  . Charcot's joint of foot 11/25/2013  . Complication of anesthesia    "I wake up angry" (12/31/2015)  . Diabetes mellitus type 1 ([place]) dx'd 1981  . Diabetic ketoacidosis ([place])   . Essential hypertension 05/03/2013  . GERD (gastroesophageal reflux disease)   . High cholesterol   . Hx MRSA infection    Inner thigh and under arm- healed areas  . Meniscus tear   . Shortness of breath    with exertion only    Past Surgical History:  Procedure Laterality Date  . AMPUTATION Left 01/01/2016   Procedure: AMPUTATION BELOW KNEE;  Surgeon: [name], MD;  Location: [facility];  Service: Orthopedics;   Laterality: Left;  . APPLICATION OF WOUND VAC  04/26/2016  . FRACTURE SURGERY    . HERNIA REPAIR    . I&D EXTREMITY Left 04/26/2016   Procedure: IRRIGATION AND DEBRIDEMENT EXTREMITY/Left Leg/Possible Wound Vac;  Surgeon: [name], MD;  Location: [facility];  Service: Orthopedics;  Laterality: Left;  . INCISE AND DRAIN ABCESS Left 04/26/2016  . KNEE ARTHROSCOPY Left ~ 2010  . LAPAROSCOPIC CHOLECYSTECTOMY  2015  . METACARPOPHALANGEAL JOINT ARTHRODESIS Left 06/2012   Fracture left index finger intra-articular MCP joint/notes 06/30/2012  . OPEN REDUCTION INTERNAL FIXATION (ORIF) PROXIMAL PHALANX Left 06/30/2012   Procedure: OPEN REDUCTION INTERNAL FIXATION (ORIF) LEFT INDEX FINGER PROXIMAL PHALANX FRACTURE WITH LIGAMENT REPAIR AS NECESSARY;  Surgeon: [name], MD;  Location: [facility];  Service: Orthopedics;  Laterality: Left;  . UMBILICAL HERNIA REPAIR  2015   "w/gallbladder OR"    Social History   Socioeconomic History  . Marital status: Married    Spouse name: Not on file  . Number of children: Not on file  . Years of education: Not on file  . Highest education level: Not on file  Social Needs  . Financial resource strain: Not on file  . Food insecurity - worry: Not on file  . Food insecurity - inability: Not on file  . Transportation needs - medical: Not on file  . Transportation needs - non-medical: Not on file  Occupational History  . Not on file  Tobacco Use  . Smoking status: Never Smoker  . Smokeless tobacco: Former Systems developer    Types: Snuff, Chew  Substance and Sexual Activity  . Alcohol use: Yes    Alcohol/week: 8.4 oz    Types: 14 Cans of beer per week  . Drug use: No  . Sexual activity: Yes  Other Topics Concern  . Not on file  Social History Narrative  . Not on file    Current Outpatient Medications on File Prior to Visit  Medication Sig Dispense Refill  . atorvastatin (LIPITOR) 40 MG tablet TAKE 1 TABLET BY MOUTH DAILY AT 6 PM. 30 tablet 0  . BAYER MICROLET  LANCETS lancets Use as instructed to check 3 times daily. 100 each 12  . Blood Glucose Monitoring Suppl (ONE TOUCH ULTRA SYSTEM KIT) w/Device KIT 1 kit by Does not apply route once. Check blood sugar 3 times daily. Diagnosis Diabetes ICD-10 E11.8    . glucagon (GLUCAGON EMERGENCY) 1 MG injection Inject 1 mg into the muscle once as needed. 1 each 11  . glucose blood (BAYER CONTOUR NEXT TEST) test strip 1 each by Other route 4 (four) times daily. And lancets 4/day 400 each 3  . [name] 100 UNIT/ML KiwkPen INJECT 3 TO 20 UNITS INTO THE SKIN 3 TIMES A DAY BEFORE MEALS. SLIDING SCALE PER CARB COUNT OF 4 UNITS FOR EVERY 30 CARBS 15 mL 1  . ibuprofen (ADVIL,MOTRIN) 800 MG tablet Take 1 tablet (800 mg total) by mouth 3 (three) times daily. 21 tablet 0  . Insulin Glargine (LANTUS SOLOSTAR) 100 UNIT/ML Solostar Pen Inject 60 Units into the skin every morning. 10 pen PRN  . insulin glargine (LANTUS) 100 UNIT/ML injection Inject 40 units at bedtime 500 mL 1  . insulin lispro ([name]) 100 UNIT/ML KiwkPen Inject 0-0.05 mLs (0-5 Units total) into the skin 3 (three) times daily with meals. 2 units for blood sugar in the 200's, and 5 units if over 300, and pen needles 4/day 15 mL 1  . Insulin Pen Needle (BD PEN NEEDLE NANO U/F) 32G X 4 MM MISC 1 pen by Does not apply route 3 (three) times daily.    Marland Kitchen losartan (COZAAR) 25 MG tablet Take 1 tablet (25 mg total) by mouth 2 (two) times daily. 90 tablet 1   No current facility-administered medications on file prior to visit.     Allergies  Allergen Reactions  . Influenza Vac Split Quad Nausea And Vomiting  . Hydrochlorothiazide     Dizziness  . Lisinopril Cough  . Losartan     Dizziness    Family History  Problem Relation Age of Onset  . Other Mother   . Cancer Mother        Breast / Bone  . Heart attack Father   . Hypertension Father   . Hyperlipidemia Father   . Diabetes Unknown   . Alcohol abuse Sister   . Diabetes Maternal  Grandfather   . Stroke Paternal Grandmother   . Alcohol abuse Paternal Grandfather     BP (!) 149/125 (BP Location: Left Wrist, Patient Position: Sitting, Cuff Size: Normal)   Pulse 100   Wt (!) 304 lb (137.9 kg)   SpO2 98%   BMI 43.62 kg/m    Review of Systems He denies hypoglycemia.     Objective:   Physical Exam VITAL SIGNS:  See vs page GENERAL: no distress Foot exam is refused--he says it is too  much trouble.        Assessment & Plan:  Type 1 DM, with PAD: glycemic control is improved. Renal insuff: he is on mostly basal insulin, but in this setting, that is unlikely to be his need.  Patient Instructions  Please continue these pump settings: basal rate of 1.0 units/hr, except 1.4/HR, 12 MN-5 AM, and 1.6/HR, 5 AM-8 AM.   mealtime bolus of 1 unit/7.5 grams carbohydrate.  correction bolus (which some people call "sensitivity," or "insulin sensitivity ratio," or just "isr") of 1 unit for each 33 by which your glucose exceeds 120.  Please come back for a follow-up appointment in 2 weeks, when you see Vaughan Basta.

## 2016-12-12 NOTE — Patient Instructions (Addendum)
Please continue these pump settings: basal rate of 1.0 units/hr, except 1.4/HR, 12 MN-5 AM, and 1.6/HR, 5 AM-8 AM.   mealtime bolus of 1 unit/7.5 grams carbohydrate.  correction bolus (which some people call "sensitivity," or "insulin sensitivity ratio," or just "isr") of 1 unit for each 33 by which your glucose exceeds 120.  Please come back for a follow-up appointment in 2 weeks, when you see Bonita Quin.

## 2016-12-19 ENCOUNTER — Ambulatory Visit: Payer: BLUE CROSS/BLUE SHIELD | Admitting: Dietician

## 2016-12-20 ENCOUNTER — Ambulatory Visit: Payer: BLUE CROSS/BLUE SHIELD | Admitting: Nutrition

## 2016-12-22 ENCOUNTER — Telehealth: Payer: Self-pay | Admitting: Endocrinology

## 2016-12-22 NOTE — Telephone Encounter (Signed)
Jasmine from [facility] called re: she sent fax to Dr. Everardo All 12/21/16. Wants to verify it was received-she needs Dr to sign off on diabetic packet-2 forms-script for shoes and inserts-also statement certifying physician and all office notes including foot exam notes for the last 3 months at least. Her ph# [phone]

## 2016-12-23 NOTE — Telephone Encounter (Signed)
I called to notify Leavy CellaJasmine that paperwork was signed & faxed over.

## 2016-12-23 NOTE — Telephone Encounter (Signed)
Jasmine calling back from [facility].

## 2016-12-26 ENCOUNTER — Ambulatory Visit (INDEPENDENT_AMBULATORY_CARE_PROVIDER_SITE_OTHER): Payer: BLUE CROSS/BLUE SHIELD | Admitting: Endocrinology

## 2016-12-26 ENCOUNTER — Encounter: Payer: Self-pay | Admitting: Endocrinology

## 2016-12-26 VITALS — BP 148/96 | HR 103 | Ht 70.0 in | Wt 320.0 lb

## 2016-12-26 DIAGNOSIS — R809 Proteinuria, unspecified: Secondary | ICD-10-CM | POA: Diagnosis not present

## 2016-12-26 DIAGNOSIS — E1029 Type 1 diabetes mellitus with other diabetic kidney complication: Secondary | ICD-10-CM

## 2016-12-26 DIAGNOSIS — E10610 Type 1 diabetes mellitus with diabetic neuropathic arthropathy: Secondary | ICD-10-CM | POA: Diagnosis not present

## 2016-12-26 DIAGNOSIS — E1065 Type 1 diabetes mellitus with hyperglycemia: Secondary | ICD-10-CM

## 2016-12-26 DIAGNOSIS — IMO0002 Reserved for concepts with insufficient information to code with codable children: Secondary | ICD-10-CM

## 2016-12-26 LAB — POCT GLYCOSYLATED HEMOGLOBIN (HGB A1C): Hemoglobin A1C: 9.2

## 2016-12-26 NOTE — Progress Notes (Signed)
Subjective:    Patient ID: Michael Hutchinson, male    DOB: 18-Sep-1975, 41 y.o.   MRN: [id number]  HPI Pt returns for f/u of diabetes mellitus: DM type: 1 Dx'ed: 0623 Complications: left BKA, polyneuropathy, renal insuff, PAD, and right charcot foot.  Therapy: insulin since dx, and pump rx (medtroniuc 670), since 2018.   DKA: once, in 2009 Severe hypoglycemia: once, in 2008.  Pancreatitis: never Pancreatic imaging: never Other: he takes lantus and PRN humalog Interval history:  He takes these settings:  basal rate of 0.8 units/hr, 24 HRS per day.    mealtime bolus of 1 unit/20 grams carbohydrate.  correction bolus (which some people call "sensitivity," or "insulin sensitivity ratio," or just "isr") of 1 unit for each 50 by which your glucose exceeds 120.  Total daily dosage: approx 40 units per day.  Pt called last week, saying cbg's was over 300.  He said then that pump may not be working.  Looking back, he says infusion set may have been kinked.  He says cbg's are better now.  He has continuous glucose monitor, but does not upload, as he does not have computer.  Our office has downloaded continuous glucose monitor data, and we reviewed together.  It varies from 60-300.  It is lowest in the afternoon.     He is not on auto mode yet.   Past Medical History:  Diagnosis Date  . Charcot's joint of foot 11/25/2013  . Complication of anesthesia    "I wake up angry" (12/31/2015)  . Diabetes mellitus type 1 ([place]) dx'd 1981  . Diabetic ketoacidosis ([place])   . Essential hypertension 05/03/2013  . GERD (gastroesophageal reflux disease)   . High cholesterol   . Hx MRSA infection    Inner thigh and under arm- healed areas  . Meniscus tear   . Shortness of breath    with exertion only    Past Surgical History:  Procedure Laterality Date  . AMPUTATION Left 01/01/2016   Procedure: AMPUTATION BELOW KNEE;  Surgeon: [name], MD;  Location: [facility];  Service: Orthopedics;  Laterality: Left;  .  APPLICATION OF WOUND VAC  04/26/2016  . FRACTURE SURGERY    . HERNIA REPAIR    . I&D EXTREMITY Left 04/26/2016   Procedure: IRRIGATION AND DEBRIDEMENT EXTREMITY/Left Leg/Possible Wound Vac;  Surgeon: [name], MD;  Location: [facility];  Service: Orthopedics;  Laterality: Left;  . INCISE AND DRAIN ABCESS Left 04/26/2016  . KNEE ARTHROSCOPY Left ~ 2010  . LAPAROSCOPIC CHOLECYSTECTOMY  2015  . METACARPOPHALANGEAL JOINT ARTHRODESIS Left 06/2012   Fracture left index finger intra-articular MCP joint/notes 06/30/2012  . OPEN REDUCTION INTERNAL FIXATION (ORIF) PROXIMAL PHALANX Left 06/30/2012   Procedure: OPEN REDUCTION INTERNAL FIXATION (ORIF) LEFT INDEX FINGER PROXIMAL PHALANX FRACTURE WITH LIGAMENT REPAIR AS NECESSARY;  Surgeon: [name], MD;  Location: [facility];  Service: Orthopedics;  Laterality: Left;  . UMBILICAL HERNIA REPAIR  2015   "w/gallbladder OR"    Social History   Socioeconomic History  . Marital status: Married    Spouse name: Not on file  . Number of children: Not on file  . Years of education: Not on file  . Highest education level: Not on file  Social Needs  . Financial resource strain: Not on file  . Food insecurity - worry: Not on file  . Food insecurity - inability: Not on file  . Transportation needs - medical: Not on file  . Transportation needs - non-medical: Not  on file  Occupational History  . Not on file  Tobacco Use  . Smoking status: Never Smoker  . Smokeless tobacco: Former Systems developer    Types: Snuff, Chew  Substance and Sexual Activity  . Alcohol use: Yes    Alcohol/week: 8.4 oz    Types: 14 Cans of beer per week  . Drug use: No  . Sexual activity: Yes  Other Topics Concern  . Not on file  Social History Narrative  . Not on file    Current Outpatient Medications on File Prior to Visit  Medication Sig Dispense Refill  . atorvastatin (LIPITOR) 40 MG tablet TAKE 1 TABLET BY MOUTH DAILY AT 6 PM. 30 tablet 0  . BAYER MICROLET LANCETS lancets Use as  instructed to check 3 times daily. 100 each 12  . Blood Glucose Monitoring Suppl (ONE TOUCH ULTRA SYSTEM KIT) w/Device KIT 1 kit by Does not apply route once. Check blood sugar 3 times daily. Diagnosis Diabetes ICD-10 E11.8    . glucagon (GLUCAGON EMERGENCY) 1 MG injection Inject 1 mg into the muscle once as needed. 1 each 11  . glucose blood (BAYER CONTOUR NEXT TEST) test strip 1 each by Other route 4 (four) times daily. And lancets 4/day 400 each 3  . [name] 100 UNIT/ML KiwkPen INJECT 3 TO 20 UNITS INTO THE SKIN 3 TIMES A DAY BEFORE MEALS. SLIDING SCALE PER CARB COUNT OF 4 UNITS FOR EVERY 30 CARBS 15 mL 1  . ibuprofen (ADVIL,MOTRIN) 800 MG tablet Take 1 tablet (800 mg total) by mouth 3 (three) times daily. 21 tablet 0  . insulin lispro ([name]) 100 UNIT/ML KiwkPen Inject 0-0.05 mLs (0-5 Units total) into the skin 3 (three) times daily with meals. 2 units for blood sugar in the 200's, and 5 units if over 300, and pen needles 4/day 15 mL 1  . Insulin Pen Needle (BD PEN NEEDLE NANO U/F) 32G X 4 MM MISC 1 pen by Does not apply route 3 (three) times daily.    Marland Kitchen losartan (COZAAR) 25 MG tablet Take 1 tablet (25 mg total) by mouth 2 (two) times daily. 90 tablet 1  . Insulin Glargine (LANTUS SOLOSTAR) 100 UNIT/ML Solostar Pen Inject 60 Units into the skin every morning. (Patient not taking: Reported on 12/26/2016) 10 pen PRN  . insulin glargine (LANTUS) 100 UNIT/ML injection Inject 40 units at bedtime (Patient not taking: Reported on 12/26/2016) 500 mL 1   No current facility-administered medications on file prior to visit.     Allergies  Allergen Reactions  . Influenza Vac Split Quad Nausea And Vomiting  . Hydrochlorothiazide     Dizziness  . Lisinopril Cough  . Losartan     Dizziness    Family History  Problem Relation Age of Onset  . Other Mother   . Cancer Mother        Breast / Bone  . Heart attack Father   . Hypertension Father   . Hyperlipidemia Father   . Diabetes  Unknown   . Alcohol abuse Sister   . Diabetes Maternal Grandfather   . Stroke Paternal Grandmother   . Alcohol abuse Paternal Grandfather     BP (!) 148/96   Pulse (!) 103   Ht '5\' 10"'$  (1.778 m)   Wt (!) 320 lb (145.2 kg)   SpO2 97%   BMI 45.92 kg/m   Review of Systems He denies LOC    Objective:   Physical Exam VITAL SIGNS:  See vs page  GENERAL: no distress Pulses: right foot pulses are intact.   MSK: no deformity of the right foot or ankle  CV: 2+ edema of the right leg. Skin:  no ulcer on the right foot or ankle.  normal temp on the feet and ankles.  There is spotty hyperpigmentation.   Neuro: sensation is intact to touch on the feet and ankles, but decreased from normal.  Ext: left BKA  Right ankle deformity is noted.     Lab Results  Component Value Date   HGBA1C 9.2 12/26/2016      Assessment & Plan:  Type 1 DM, with polyneuropathy: the pattern of his cbg's indicates he needs some adjustment in his therapy  Patient Instructions  Please take these pump settings:  basal rate of 0.6 units/hr, 24 HRS per day  mealtime bolus of 1 unit/12 grams carbohydrate.  correction bolus (which some people call "sensitivity," or "insulin sensitivity ratio," or just "isr") of 1 unit for each 50 by which your glucose exceeds 120.  Please see Vaughan Basta, as scheduled.  Please come back for a follow-up appointment in 1 month.

## 2016-12-26 NOTE — Patient Instructions (Addendum)
Please take these pump settings:  basal rate of 0.6 units/hr, 24 HRS per day  mealtime bolus of 1 unit/12 grams carbohydrate.  correction bolus (which some people call "sensitivity," or "insulin sensitivity ratio," or just "isr") of 1 unit for each 50 by which your glucose exceeds 120.  Please see Michael Hutchinson, as scheduled.  Please come back for a follow-up appointment in 1 month.

## 2016-12-28 ENCOUNTER — Other Ambulatory Visit: Payer: Self-pay

## 2016-12-28 ENCOUNTER — Encounter: Payer: BLUE CROSS/BLUE SHIELD | Attending: Endocrinology | Admitting: Nutrition

## 2016-12-28 DIAGNOSIS — E118 Type 2 diabetes mellitus with unspecified complications: Secondary | ICD-10-CM

## 2016-12-28 MED ORDER — INSULIN LISPRO 100 UNIT/ML ~~LOC~~ SOLN: SUBCUTANEOUS | 11 refills | Status: DC

## 2016-12-29 NOTE — Progress Notes (Signed)
Pt. Was trained on the automode via slide presentation.  He reported good understanding of this and his pump went into auto mode without difficultly. He signed off as understanding all topics and had no final questions. Pump was downloaded and it showed average blood sugar around 160.  Put this on Dr. George HughEllison's desk to review.  Has an appt. With him in 3 weeks.  Per Dr. George HughEllison's request, no changes were made to his settings.

## 2016-12-29 NOTE — Patient Instructions (Signed)
REad over the manual on auto mode. Call if questions.

## 2017-01-11 NOTE — Progress Notes (Signed)
This patient was trained on the automode via a slide presentation.  He had no final questions.  He was put in the auto mode, without any difficulty.  He signed off checklist as understanding all topics and had no final questions

## 2017-01-13 ENCOUNTER — Telehealth: Payer: Self-pay | Admitting: Endocrinology

## 2017-01-13 NOTE — Telephone Encounter (Signed)
[facility] calling on the status of a form that was faxed for diabetic shoes, and a statement form for the Dr Everardo All to fill out. Please fax to [phone]

## 2017-01-13 NOTE — Telephone Encounter (Signed)
I spoke with [facility] from [facility] and all she needed was for me to fax back previous paperwork filled out by Dr. Everardo All. I have resent fax.

## 2017-01-16 NOTE — Telephone Encounter (Signed)
[facility] is calling about a paper we were faxing over, states she has not received it. Pt is in there office today   [phone]

## 2017-01-16 NOTE — Telephone Encounter (Signed)
I have faxed back once again previous paperwork from December that Dr. Everardo All signed & I see when I resent fax last week it did not go through.

## 2017-01-18 NOTE — Telephone Encounter (Signed)
[facility] called stated they need additional information from [place] from Dec 3rd stating when foot exam was done.  Fax #[phone]

## 2017-01-19 NOTE — Telephone Encounter (Signed)
Sorry, I have nothing to ad to what was already sent

## 2017-01-19 NOTE — Telephone Encounter (Signed)
I have faxed over previously signed paperwork & OV notes from 12/12/16.

## 2017-01-19 NOTE — Telephone Encounter (Signed)
Pts wife called about speaking to someone about the Diabetic shoes that the pt is wanting. States that her husband did not refuse to let the Dr see his foot    Please advise

## 2017-01-19 NOTE — Telephone Encounter (Signed)
Ellison pt 

## 2017-01-20 NOTE — Telephone Encounter (Signed)
I have faxed over paperwork that was sent with OV notes from 12/12/2016.

## 2017-01-24 ENCOUNTER — Telehealth: Payer: Self-pay | Admitting: Endocrinology

## 2017-01-24 NOTE — Telephone Encounter (Signed)
Patient needs a copy of his last foot exam. Please call patient at ph# [phone] when ready to pick up

## 2017-01-25 NOTE — Telephone Encounter (Signed)
I called patient & since all the confusion with [facility] plus the back & forth with faxes, patient stated that she would wait until his appointment next week to address issues.

## 2017-02-01 ENCOUNTER — Other Ambulatory Visit: Payer: Self-pay

## 2017-02-01 ENCOUNTER — Ambulatory Visit (INDEPENDENT_AMBULATORY_CARE_PROVIDER_SITE_OTHER): Payer: BLUE CROSS/BLUE SHIELD | Admitting: Endocrinology

## 2017-02-01 DIAGNOSIS — R809 Proteinuria, unspecified: Secondary | ICD-10-CM | POA: Diagnosis not present

## 2017-02-01 DIAGNOSIS — E1029 Type 1 diabetes mellitus with other diabetic kidney complication: Secondary | ICD-10-CM | POA: Diagnosis not present

## 2017-02-01 MED ORDER — INSULIN ASPART 100 UNIT/ML ~~LOC~~ SOLN: SUBCUTANEOUS | 11 refills | Status: DC

## 2017-02-01 MED ORDER — INSULIN LISPRO 100 UNIT/ML ~~LOC~~ SOLN: SUBCUTANEOUS | 11 refills | Status: DC

## 2017-02-01 NOTE — Patient Instructions (Addendum)
Please take these pump settings:  basal rate of 0.6 units/hr, 24 HRS per day  mealtime bolus of 1 unit/12 grams carbohydrate.  correction bolus (which some people call "sensitivity," or "insulin sensitivity ratio," or just "isr") of 1 unit for each 50 by which your glucose exceeds 120.  A blood test is requested for you today.  We'll let you know about the results. Please come back for a follow-up appointment in 4 months.

## 2017-02-01 NOTE — Telephone Encounter (Signed)
humalog script was sent.  Insurance will only [employer].  Please advise [facility] would like to know if it is okay to change.  [phone]

## 2017-02-01 NOTE — Telephone Encounter (Signed)
I sent in for novolog.

## 2017-02-01 NOTE — Progress Notes (Signed)
Subjective:    Patient ID: Michael Hutchinson, male    DOB: February 07, 1975, 42 y.o.   MRN: [id number]  HPI Pt returns for f/u of diabetes mellitus: DM type: 1 Dx'ed: 0762 Complications: left BKA, polyneuropathy, renal insuff, PAD, and right charcot foot.  Therapy: insulin since dx, and pump rx (medtroniuc 670), since 2018.   DKA: once, in 2009 Severe hypoglycemia: once, in 2008.   Pancreatitis: never Pancreatic imaging: never Other: he takes lantus and PRN humalog Interval history:  He takes these settings:  basal rate of 0.6 units/hr, 24 HRS per day.    mealtime bolus of 1 unit/12 grams carbohydrate.  correction bolus (which some people call "sensitivity," or "insulin sensitivity ratio," or just "isr") of 1 unit for each 50 by which your glucose exceeds 120.  Total daily dosage: approx 53 units per day.  Pt called last week, saying cbg's was over 300.  He said then that pump may not be working.  Looking back, he says infusion set may have been kinked.  He says cbg's are better now.  He has continuous glucose monitor, but does not upload, as he does not have computer.  Our office has downloaded continuous glucose monitor data, and we reviewed together.  It varies from 50-280.  There is no trend throughout the day.   He is on auto mode now.  Past Medical History:  Diagnosis Date  . Charcot's joint of foot 11/25/2013  . Complication of anesthesia    "I wake up angry" (12/31/2015)  . Diabetes mellitus type 1 ([place]) dx'd 1981  . Diabetic ketoacidosis ([place])   . Essential hypertension 05/03/2013  . GERD (gastroesophageal reflux disease)   . High cholesterol   . Hx MRSA infection    Inner thigh and under arm- healed areas  . Meniscus tear   . Shortness of breath    with exertion only    Past Surgical History:  Procedure Laterality Date  . AMPUTATION Left 01/01/2016   Procedure: AMPUTATION BELOW KNEE;  Surgeon: [name], MD;  Location: [place];  Service: Orthopedics;  Laterality: Left;    . APPLICATION OF WOUND VAC  04/26/2016  . FRACTURE SURGERY    . HERNIA REPAIR    . I&D EXTREMITY Left 04/26/2016   Procedure: IRRIGATION AND DEBRIDEMENT EXTREMITY/Left Leg/Possible Wound Vac;  Surgeon: [name], MD;  Location: [place];  Service: Orthopedics;  Laterality: Left;  . INCISE AND DRAIN ABCESS Left 04/26/2016  . KNEE ARTHROSCOPY Left ~ 2010  . LAPAROSCOPIC CHOLECYSTECTOMY  2015  . METACARPOPHALANGEAL JOINT ARTHRODESIS Left 06/2012   Fracture left index finger intra-articular MCP joint/notes 06/30/2012  . OPEN REDUCTION INTERNAL FIXATION (ORIF) PROXIMAL PHALANX Left 06/30/2012   Procedure: OPEN REDUCTION INTERNAL FIXATION (ORIF) LEFT INDEX FINGER PROXIMAL PHALANX FRACTURE WITH LIGAMENT REPAIR AS NECESSARY;  Surgeon: [name], MD;  Location: [place];  Service: Orthopedics;  Laterality: Left;  . UMBILICAL HERNIA REPAIR  2015   "w/gallbladder OR"    Social History   Socioeconomic History  . Marital status: Married    Spouse name: Not on file  . Number of children: Not on file  . Years of education: Not on file  . Highest education level: Not on file  Social Needs  . Financial resource strain: Not on file  . Food insecurity - worry: Not on file  . Food insecurity - inability: Not on file  . Transportation needs - medical: Not on file  . Transportation needs - non-medical: Not on  file  Occupational History  . Not on file  Tobacco Use  . Smoking status: Never Smoker  . Smokeless tobacco: Former Systems developer    Types: Snuff, Chew  Substance and Sexual Activity  . Alcohol use: Yes    Alcohol/week: 8.4 oz    Types: 14 Cans of beer per week  . Drug use: No  . Sexual activity: Yes  Other Topics Concern  . Not on file  Social History Narrative  . Not on file    Current Outpatient Medications on File Prior to Visit  Medication Sig Dispense Refill  . atorvastatin (LIPITOR) 40 MG tablet TAKE 1 TABLET BY MOUTH DAILY AT 6 PM. 30 tablet 0  . BAYER MICROLET LANCETS lancets Use as  instructed to check 3 times daily. 100 each 12  . Blood Glucose Monitoring Suppl (ONE TOUCH ULTRA SYSTEM KIT) w/Device KIT 1 kit by Does not apply route once. Check blood sugar 3 times daily. Diagnosis Diabetes ICD-10 E11.8    . glucagon (GLUCAGON EMERGENCY) 1 MG injection Inject 1 mg into the muscle once as needed. 1 each 11  . glucose blood (BAYER CONTOUR NEXT TEST) test strip 1 each by Other route 4 (four) times daily. And lancets 4/day 400 each 3  . ibuprofen (ADVIL,MOTRIN) 800 MG tablet Take 1 tablet (800 mg total) by mouth 3 (three) times daily. 21 tablet 0  . losartan (COZAAR) 25 MG tablet Take 1 tablet (25 mg total) by mouth 2 (two) times daily. 90 tablet 1   No current facility-administered medications on file prior to visit.     Allergies  Allergen Reactions  . Influenza Vac Split Quad Nausea And Vomiting  . Hydrochlorothiazide     Dizziness  . Lisinopril Cough  . Losartan     Dizziness    Family History  Problem Relation Age of Onset  . Other Mother   . Cancer Mother        Breast / Bone  . Heart attack Father   . Hypertension Father   . Hyperlipidemia Father   . Diabetes Unknown   . Alcohol abuse Sister   . Diabetes Maternal Grandfather   . Stroke Paternal Grandmother   . Alcohol abuse Paternal Grandfather     There were no vitals taken for this visit.   Review of Systems He denies LOC   Objective:   Physical Exam GENERAL: no distress Pulses: right foot pulses are intact.   MSK: no deformity of the right foot or ankle  CV: 2+ edema of the right leg. Skin:  no ulcer on the right foot or ankle.  normal temp on the feet and ankles.  There is spotty hyperpigmentation.   Neuro: sensation is intact to touch on the feet and ankles, but decreased from normal.  Ext: left BKA  Right ankle deformity is noted.    Lab Results  Component Value Date   HGBA1C 9.2 12/26/2016      Assessment & Plan:  Type 1 DM, with PAD: glycemic control appears to be better  now Renal insuff: in this setting, he does not need much basal insulin  Patient Instructions  Please take these pump settings:  basal rate of 0.6 units/hr, 24 HRS per day  mealtime bolus of 1 unit/12 grams carbohydrate.  correction bolus (which some people call "sensitivity," or "insulin sensitivity ratio," or just "isr") of 1 unit for each 50 by which your glucose exceeds 120.  A blood test is requested for you today.  We'll  let you know about the results. Please come back for a follow-up appointment in 4 months.

## 2017-02-03 ENCOUNTER — Telehealth: Payer: Self-pay | Admitting: Endocrinology

## 2017-02-03 LAB — FRUCTOSAMINE: FRUCTOSAMINE: 279 umol/L — AB (ref 190–270)

## 2017-02-03 NOTE — Telephone Encounter (Signed)
Jasmine with [facility] ph# [phone] needs notes from the 02/01/17 visit.

## 2017-02-06 ENCOUNTER — Telehealth: Payer: Self-pay | Admitting: Endocrinology

## 2017-02-06 NOTE — Telephone Encounter (Signed)
Patient is calling for lab results.

## 2017-02-06 NOTE — Telephone Encounter (Signed)
I called patient & gave him lab results. I am also faxiing Jasmine from [facility] last OV notes.

## 2017-02-07 ENCOUNTER — Other Ambulatory Visit: Payer: Self-pay

## 2017-02-10 ENCOUNTER — Other Ambulatory Visit: Payer: Self-pay

## 2017-02-16 DIAGNOSIS — M14671 Charcot's joint, right ankle and foot: Secondary | ICD-10-CM | POA: Diagnosis not present

## 2017-02-16 DIAGNOSIS — E1140 Type 2 diabetes mellitus with diabetic neuropathy, unspecified: Secondary | ICD-10-CM | POA: Diagnosis not present

## 2017-02-17 DIAGNOSIS — Z794 Long term (current) use of insulin: Secondary | ICD-10-CM | POA: Diagnosis not present

## 2017-02-17 DIAGNOSIS — E109 Type 1 diabetes mellitus without complications: Secondary | ICD-10-CM | POA: Diagnosis not present

## 2017-02-28 ENCOUNTER — Other Ambulatory Visit: Payer: Self-pay

## 2017-02-28 ENCOUNTER — Telehealth: Payer: Self-pay | Admitting: Endocrinology

## 2017-02-28 NOTE — Telephone Encounter (Signed)
Patient requesting CB about Humulog Rx.

## 2017-02-28 NOTE — Telephone Encounter (Signed)
I have spoken with patient's wife & have submitted a PA for Humalog. Patient's blood sugars run over 200 on Novolog. Patient feels that Humalog is the only insulin that keeps his blood sugar at target range & A1C down. I am waiting to hear back from PA.

## 2017-03-01 ENCOUNTER — Other Ambulatory Visit: Payer: Self-pay

## 2017-03-01 MED ORDER — INSULIN LISPRO 100 UNIT/ML ~~LOC~~ SOLN: SUBCUTANEOUS | 11 refills | Status: DC

## 2017-03-01 NOTE — Telephone Encounter (Addendum)
Michael Hutchinson. With [employer] is calling regarding to Humalog 100 units prescription, she stated the drug has been approved. Called [email or web address]

## 2017-03-01 NOTE — Telephone Encounter (Signed)
I called patient to let him know that I sent a prescription to pharmacy for humalog & PA was approved the [employer].

## 2017-03-06 DIAGNOSIS — Z794 Long term (current) use of insulin: Secondary | ICD-10-CM | POA: Diagnosis not present

## 2017-03-06 DIAGNOSIS — E109 Type 1 diabetes mellitus without complications: Secondary | ICD-10-CM | POA: Diagnosis not present

## 2017-03-20 DIAGNOSIS — M25521 Pain in right elbow: Secondary | ICD-10-CM | POA: Diagnosis not present

## 2017-03-20 DIAGNOSIS — M7711 Lateral epicondylitis, right elbow: Secondary | ICD-10-CM | POA: Diagnosis not present

## 2017-03-28 ENCOUNTER — Emergency Department (HOSPITAL_COMMUNITY)
Admission: EM | Admit: 2017-03-28 | Discharge: 2017-03-29 | Disposition: A | Discharge status: Home / Self Care | Payer: BLUE CROSS/BLUE SHIELD | Attending: Emergency Medicine | Admitting: Emergency Medicine

## 2017-03-28 ENCOUNTER — Encounter (HOSPITAL_COMMUNITY): Payer: Self-pay | Admitting: Emergency Medicine

## 2017-03-28 ENCOUNTER — Other Ambulatory Visit: Payer: Self-pay

## 2017-03-28 ENCOUNTER — Emergency Department (HOSPITAL_COMMUNITY): Payer: BLUE CROSS/BLUE SHIELD

## 2017-03-28 DIAGNOSIS — E109 Type 1 diabetes mellitus without complications: Secondary | ICD-10-CM | POA: Insufficient documentation

## 2017-03-28 DIAGNOSIS — R000 Tachycardia, unspecified: Secondary | ICD-10-CM | POA: Diagnosis not present

## 2017-03-28 DIAGNOSIS — H748X2 Other specified disorders of left middle ear and mastoid: Secondary | ICD-10-CM | POA: Diagnosis not present

## 2017-03-28 DIAGNOSIS — H4902 Third [oculomotor] nerve palsy, left eye: Secondary | ICD-10-CM | POA: Insufficient documentation

## 2017-03-28 DIAGNOSIS — I10 Essential (primary) hypertension: Secondary | ICD-10-CM | POA: Insufficient documentation

## 2017-03-28 DIAGNOSIS — R29810 Facial weakness: Secondary | ICD-10-CM | POA: Diagnosis not present

## 2017-03-28 DIAGNOSIS — R51 Headache: Secondary | ICD-10-CM | POA: Diagnosis not present

## 2017-03-28 LAB — COMPREHENSIVE METABOLIC PANEL
ALT: 20 U/L (ref 17–63)
AST: 20 U/L (ref 15–41)
Albumin: 3.9 g/dL (ref 3.5–5.0)
Alkaline Phosphatase: 69 U/L (ref 38–126)
Anion gap: 8 (ref 5–15)
BUN: 14 mg/dL (ref 6–20)
CO2: 26 mmol/L (ref 22–32)
Calcium: 8.9 mg/dL (ref 8.9–10.3)
Chloride: 102 mmol/L (ref 101–111)
Creatinine, Ser: 1.19 mg/dL (ref 0.61–1.24)
GFR calc Af Amer: >60 mL/min (ref >60)
GFR calc non Af Amer: >60 mL/min (ref >60)
Glucose, Bld: 188 mg/dL — ABNORMAL HIGH (ref 65–99)
Potassium: 4.2 mmol/L (ref 3.5–5.1)
Sodium: 136 mmol/L (ref 135–145)
Total Bilirubin: 0.6 mg/dL (ref 0.3–1.2)
Total Protein: 7.2 g/dL (ref 6.5–8.1)

## 2017-03-28 LAB — CBC WITH DIFFERENTIAL/PLATELET
Basophils Absolute: 0 10*3/uL (ref 0.0–0.1)
Basophils Relative: 0 %
Eosinophils Absolute: 0.1 10*3/uL (ref 0.0–0.7)
Eosinophils Relative: 2 %
HCT: 38.4 % — ABNORMAL LOW (ref 39.0–52.0)
Hemoglobin: 12.8 g/dL — ABNORMAL LOW (ref 13.0–17.0)
Lymphocytes Relative: 19 %
Lymphs Abs: 1.2 10*3/uL (ref 0.7–4.0)
MCH: 29.3 pg (ref 26.0–34.0)
MCHC: 33.3 g/dL (ref 30.0–36.0)
MCV: 87.9 fL (ref 78.0–100.0)
Monocytes Absolute: 0.6 10*3/uL (ref 0.1–1.0)
Monocytes Relative: 10 %
Neutro Abs: 4.2 10*3/uL (ref 1.7–7.7)
Neutrophils Relative %: 69 %
Platelets: 283 10*3/uL (ref 150–400)
RBC: 4.37 MIL/uL (ref 4.22–5.81)
RDW: 13.1 % (ref 11.5–15.5)
WBC: 6.1 10*3/uL (ref 4.0–10.5)

## 2017-03-28 LAB — TSH: TSH: 3.493 u[IU]/mL (ref 0.350–4.500)

## 2017-03-28 MED ORDER — IOPAMIDOL (ISOVUE-370) INJECTION 76%
INTRAVENOUS | Status: AC
  Administered 2017-03-28: 50 mL
  Filled 2017-03-28: qty 50

## 2017-03-28 MED ORDER — ACETAMINOPHEN 325 MG PO TABS
650.0000 mg | ORAL_TABLET | Freq: Once | ORAL | Status: AC
Start: 2017-03-28 — End: 2017-03-28
  Administered 2017-03-28: 650 mg via ORAL
  Filled 2017-03-28: qty 2

## 2017-03-28 MED ORDER — LORAZEPAM 2 MG/ML IJ SOLN
1.0000 mg | Freq: Once | INTRAMUSCULAR | Status: AC
  Administered 2017-03-28: 1 mg via INTRAVENOUS
  Filled 2017-03-28: qty 1

## 2017-03-28 NOTE — ED Provider Notes (Signed)
Emergency Department Provider Note   I have reviewed the triage vital signs and the nursing notes.   HISTORY  Chief Complaint sent from eye dr; [facility]; and droopy eye   HPI HOMAR WEINKAUF is a 42 y.o. male with PMH of DM, HLD, and GERD presents to the emergency department for evaluation of double vision with left-sided headache.  Symptoms began 4 days prior.  Headache remains but is not severe.  No history of similar headaches.  Denies any fevers, neck stiffness.  He went to the eye doctor today and was referred to the emergency department for further evaluation.  No chest pain or difficulty breathing.  No sudden severe headache symptoms. No radiation of symptoms or modifying factors.    Past Medical History:  Diagnosis Date  . Charcot's joint of foot 11/25/2013  . Complication of anesthesia    "I wake up angry" (12/31/2015)  . Diabetes mellitus type 1 (HCC) dx'd 1981  . Diabetic ketoacidosis (HCC)   . Essential hypertension 05/03/2013  . GERD (gastroesophageal reflux disease)   . High cholesterol   . Hx MRSA infection    Inner thigh and under arm- healed areas  . Meniscus tear   . Shortness of breath    with exertion only    Patient Active Problem List   Diagnosis Date Noted  . Chronic fatigue 07/04/2016  . Impaired exercise tolerance 07/04/2016  . Acute left otitis media 06/09/2016  . Hx of BKA, left - with subsequent cellulitis and treatment for osteomylitis 06/01/2016  . Abscess of left leg 04/26/2016  . Diabetes mellitus with complication (HCC)   . Normocytic anemia 01/01/2016  . Osteomyelitis (HCC) 12/31/2015  . 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 Ankle wound 12/31/2015  . Chronic osteomyelitis of left ankle with draining sinus (HCC)   . Type 1 diabetes, uncontrolled, with Charcot's joint of foot (HCC) 10/12/2015  . Low HDL (under 40) 09/22/2015  . Obesity, Class III, BMI  40-49.9 (morbid obesity) (HCC) 09/22/2015  . H/O noncompliance with medical treatment, presenting hazards to health 09/22/2015  . Hx MRSA infection 09/22/2015  . Vitamin D deficiency 09/22/2015  . Diabetes mellitus type 1 (HCC)   . Charcot's joint of foot 11/25/2013  . Essential hypertension 05/03/2013  . Hyperlipidemia 07/26/2012  . Diabetic ketoacidosis (HCC)   . GERD (gastroesophageal reflux disease)   . Meniscus tear   . Epigastric pain 12/10/2011  . Vomiting 12/10/2011  . Problems influencing health status 11/21/2011    Past Surgical History:  Procedure Laterality Date  . AMPUTATION Left 01/01/2016   Procedure: AMPUTATION BELOW KNEE;  Surgeon: Toni Arthurs, MD;  Location: [facility] OR;  Service: Orthopedics;  Laterality: Left;  . APPLICATION OF WOUND VAC  04/26/2016  . FRACTURE SURGERY    . HERNIA REPAIR    . I&D EXTREMITY Left 04/26/2016   Procedure: IRRIGATION AND DEBRIDEMENT EXTREMITY/Left Leg/Possible Wound Vac;  Surgeon: Toni Arthurs, MD;  Location: [facility] OR;  Service: Orthopedics;  Laterality: Left;  . INCISE AND DRAIN ABCESS Left 04/26/2016  . KNEE ARTHROSCOPY Left ~ 2010  . LAPAROSCOPIC CHOLECYSTECTOMY  2015  . METACARPOPHALANGEAL JOINT ARTHRODESIS Left 06/2012   Fracture left index finger intra-articular MCP joint/notes 06/30/2012  . OPEN REDUCTION INTERNAL FIXATION (ORIF) PROXIMAL PHALANX Left 06/30/2012   Procedure: OPEN REDUCTION INTERNAL FIXATION (ORIF) LEFT INDEX FINGER PROXIMAL PHALANX FRACTURE WITH LIGAMENT REPAIR AS NECESSARY;  Surgeon: Dominica Severin, MD;  Location: [facility] OR;  Service: Orthopedics;  Laterality: Left;  . UMBILICAL HERNIA REPAIR  2015   "w/gallbladder OR"    Current Outpatient Rx  . Order #: [id number] Class: Normal  . Order #: [id number] Class: Historical Med  . Order #: [id number] Class: Normal  . Order #: [id number] Class: Normal  . Order #: [id number] Class: Normal  . Order #: [id number] Class: Normal  . Order #: [id number] Class: Print    Allergies Influenza vac  split quad; Hydrochlorothiazide; Lisinopril; and Losartan  Family History  Problem Relation Age of Onset  . Other Mother   . Cancer Mother        Breast / Bone  . Heart attack Father   . Hypertension Father   . Hyperlipidemia Father   . Diabetes Unknown   . Alcohol abuse Sister   . Diabetes Maternal Grandfather   . Stroke Paternal Grandmother   . Alcohol abuse Paternal Grandfather     Social History Social History   Tobacco Use  . Smoking status: Never Smoker  . Smokeless tobacco: Former Neurosurgeon    Types: Snuff, Chew  Substance Use Topics  . Alcohol use: Yes    Alcohol/week: 8.4 oz    Types: 14 Cans of beer per week  . Drug use: No    Review of Systems  Constitutional: No fever/chills Eyes: Positive double vision.  ENT: No sore throat. Cardiovascular: Denies chest pain. Respiratory: Denies shortness of breath. Gastrointestinal: No abdominal pain.  No nausea, no vomiting.  No diarrhea.  No constipation. Genitourinary: Negative for dysuria. Musculoskeletal: Negative for back pain. Skin: Negative for rash. Neurological: Negative for focal weakness or numbness. Positive HA.  10-point ROS otherwise negative.  ____________________________________________   PHYSICAL EXAM:  VITAL SIGNS: ED Triage Vitals  Enc Vitals Group     BP 03/28/17 1641 (!) 131/92     Pulse Rate 03/28/17 1641 (!) 106     Resp 03/28/17 1641 18     Temp 03/28/17 1641 98.4 F (36.9 C)     Temp Source 03/28/17 1641 Oral     SpO2 03/28/17 1641 98 %     Weight 03/28/17 1641 220 lb (99.8 kg)     Height 03/28/17 1641 6' (1.829 m)     Pain Score 03/28/17 1649 5   Constitutional: Alert and oriented. Well appearing and in no acute distress. Eyes: Conjunctivae are normal. Patient unable to adduct left eye but convergence is maintained. Ptosis on the left.  Head: Atraumatic. Nose: No congestion/rhinnorhea. Mouth/Throat: Mucous membranes are moist.  Neck: No stridor. Cardiovascular: Tachycardia. Good  peripheral circulation. Grossly normal heart sounds.   Respiratory: Normal respiratory effort.  No retractions. Lungs CTAB. Gastrointestinal: Soft and nontender.  Musculoskeletal: No lower extremity tenderness nor edema. No gross deformities of extremities. Neurologic:  Normal speech and language. No gross focal neurologic deficits are appreciated.  Skin:  Skin is warm, dry and intact. No rash noted.  ____________________________________________   LABS (all labs ordered are listed, but only abnormal results are displayed)  Labs Reviewed  COMPREHENSIVE METABOLIC PANEL - Abnormal; Notable for the following components:      Result Value   Glucose, Bld 188 (*)    All other components within normal limits  CBC WITH DIFFERENTIAL/PLATELET - Abnormal; Notable for the following components:   Hemoglobin 12.8 (*)    HCT 38.4 (*)    All other components within normal limits  TSH   ____________________________________________  EKG   EKG Interpretation  Date/Time:  Tuesday March 28 2017 22:41:55 EDT Ventricular  Rate:  96 PR Interval:    QRS Duration: 92 QT Interval:  341 QTC Calculation: 431 R Axis:   44 Text Interpretation:  Sinus rhythm Low voltage, precordial leads Normal axis No STEMI.  Confirmed by Alona Bene [phone]) on 03/29/2017 1:56:54 PM       ____________________________________________  RADIOLOGY  Ct Angio Head W Or Wo Contrast  Result Date: 03/28/2017 CLINICAL DATA:  Left facial drooping and blurry vision for 2 days EXAM: CT ANGIOGRAPHY HEAD AND NECK TECHNIQUE: Multidetector CT imaging of the head and neck was performed using the standard protocol during bolus administration of intravenous contrast. Multiplanar CT image reconstructions and MIPs were obtained to evaluate the vascular anatomy. Carotid stenosis measurements (when applicable) are obtained utilizing NASCET criteria, using the distal internal carotid diameter as the denominator. CONTRAST:  <See Chart>  ISOVUE-370 IOPAMIDOL (ISOVUE-370) INJECTION 76% COMPARISON:  Brain MRI 05/11/2015 FINDINGS: CT HEAD FINDINGS Brain: No mass lesion, intraparenchymal hemorrhage or extra-axial collection. No evidence of acute cortical infarct. 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 Vascular: Atherosclerotic calcification of the vertebral and internal carotid arteries at the skull base. No hyperdense vessel. Skull: Normal visualized skull base, calvarium and extracranial soft tissues. Sinuses/Orbits: No sinus fluid levels or advanced mucosal thickening. No mastoid effusion. Normal orbits. CTA NECK FINDINGS Aortic arch: There is no calcific atherosclerosis of the aortic arch. There is no aneurysm, dissection or hemodynamically significant stenosis of the visualized ascending aorta and aortic arch. Conventional 3 vessel aortic branching pattern. The visualized proximal subclavian arteries are widely patent. Right carotid system: --Common carotid artery: Widely patent origin without common carotid artery dissection or aneurysm. --Internal carotid artery: No dissection, occlusion or aneurysm. No hemodynamically significant stenosis. --External carotid artery: No acute abnormality. Left carotid system: --Common carotid artery: Widely patent origin without common carotid artery dissection or aneurysm. --Internal carotid artery:No dissection, occlusion or aneurysm. No hemodynamically significant stenosis. --External carotid artery: No acute abnormality. Vertebral arteries: Codominant configuration. Both origins are normal. No dissection, occlusion or flow-limiting stenosis to the vertebrobasilar confluence. Skeleton: There is no bony spinal canal stenosis. No lytic or blastic lesion. Other neck: Mildly enlarged heterogeneous thyroid gland. Upper chest: No pneumothorax or pleural effusion. No nodules or masses. CTA HEAD FINDINGS Anterior circulation: --Intracranial internal carotid arteries: Atherosclerotic calcification of the internal  carotid arteries at the skull base without hemodynamically significant stenosis. --Anterior cerebral arteries: Normal. Hypoplastic left A1 segment, normal variant. Patent anterior communicating artery. --Middle cerebral arteries: Normal. --Posterior communicating arteries: Absent bilaterally. Posterior circulation: --Basilar artery: Normal. --Posterior cerebral arteries: Normal. --Superior cerebellar arteries: Normal. --Inferior cerebellar arteries: Normal anterior and posterior inferior cerebellar arteries. Venous sinuses: As permitted by contrast timing, patent. Anatomic variants: None Delayed phase: No parenchymal contrast enhancement. Review of the MIP images confirms the above findings. IMPRESSION: 1. No emergent large vessel occlusion or acute vascular abnormality of the head or neck. 2. No hemodynamically significant stenosis. 3. Mildly enlarged and heterogeneous thyroid gland may indicate multinodular goiter. Electronically Signed   By: Deatra Robinson M.D.   On: 03/28/2017 20:38   Ct Angio Neck W And/or Wo Contrast  Result Date: 03/28/2017 CLINICAL DATA:  Left facial drooping and blurry vision for 2 days EXAM: CT ANGIOGRAPHY HEAD AND NECK TECHNIQUE: Multidetector CT imaging of the head and neck was performed using the standard protocol during bolus administration of intravenous contrast. Multiplanar CT image reconstructions and MIPs were obtained to evaluate the vascular anatomy. Carotid stenosis measurements (when applicable) are obtained utilizing NASCET criteria, using the  distal internal carotid diameter as the denominator. CONTRAST:  <See Chart> ISOVUE-370 IOPAMIDOL (ISOVUE-370) INJECTION 76% COMPARISON:  Brain MRI 05/11/2015 FINDINGS: CT HEAD FINDINGS Brain: No mass lesion, intraparenchymal hemorrhage or extra-axial collection. No evidence of acute cortical infarct. 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 Vascular: Atherosclerotic calcification of the vertebral and internal carotid arteries at the  skull base. No hyperdense vessel. Skull: Normal visualized skull base, calvarium and extracranial soft tissues. Sinuses/Orbits: No sinus fluid levels or advanced mucosal thickening. No mastoid effusion. Normal orbits. CTA NECK FINDINGS Aortic arch: There is no calcific atherosclerosis of the aortic arch. There is no aneurysm, dissection or hemodynamically significant stenosis of the visualized ascending aorta and aortic arch. Conventional 3 vessel aortic branching pattern. The visualized proximal subclavian arteries are widely patent. Right carotid system: --Common carotid artery: Widely patent origin without common carotid artery dissection or aneurysm. --Internal carotid artery: No dissection, occlusion or aneurysm. No hemodynamically significant stenosis. --External carotid artery: No acute abnormality. Left carotid system: --Common carotid artery: Widely patent origin without common carotid artery dissection or aneurysm. --Internal carotid artery:No dissection, occlusion or aneurysm. No hemodynamically significant stenosis. --External carotid artery: No acute abnormality. Vertebral arteries: Codominant configuration. Both origins are normal. No dissection, occlusion or flow-limiting stenosis to the vertebrobasilar confluence. Skeleton: There is no bony spinal canal stenosis. No lytic or blastic lesion. Other neck: Mildly enlarged heterogeneous thyroid gland. Upper chest: No pneumothorax or pleural effusion. No nodules or masses. CTA HEAD FINDINGS Anterior circulation: --Intracranial internal carotid arteries: Atherosclerotic calcification of the internal carotid arteries at the skull base without hemodynamically significant stenosis. --Anterior cerebral arteries: Normal. Hypoplastic left A1 segment, normal variant. Patent anterior communicating artery. --Middle cerebral arteries: Normal. --Posterior communicating arteries: Absent bilaterally. Posterior circulation: --Basilar artery: Normal. --Posterior cerebral  arteries: Normal. --Superior cerebellar arteries: Normal. --Inferior cerebellar arteries: Normal anterior and posterior inferior cerebellar arteries. Venous sinuses: As permitted by contrast timing, patent. Anatomic variants: None Delayed phase: No parenchymal contrast enhancement. Review of the MIP images confirms the above findings. IMPRESSION: 1. No emergent large vessel occlusion or acute vascular abnormality of the head or neck. 2. No hemodynamically significant stenosis. 3. Mildly enlarged and heterogeneous thyroid gland may indicate multinodular goiter. Electronically Signed   By: Deatra Robinson M.D.   On: 03/28/2017 20:38   Mr Laqueta Jean And Wo Contrast  Result Date: 03/29/2017 CLINICAL DATA:  Initial evaluation for acute left upper eyelid drooping with foggy central vision for 2 days, left-sided headache. EXAM: MRI HEAD AND ORBITS WITHOUT AND WITH CONTRAST TECHNIQUE: Multiplanar, multiecho pulse sequences of the brain and surrounding structures were obtained without and with intravenous contrast. Multiplanar, multiecho pulse sequences of the orbits and surrounding structures were obtained including fat saturation techniques, before and after intravenous contrast administration. CONTRAST:  20mL MULTIHANCE GADOBENATE DIMEGLUMINE 529 MG/ML IV SOLN COMPARISON:  Prior MRI from 05/11/2015. FINDINGS: MRI HEAD FINDINGS Brain: Cerebral volume within normal limits for age. There are several scattered T2/FLAIR hyperintense foci seen involving the periventricular, deep, and subcortical white matter of both cerebral hemispheres. Several of these foci are oriented perpendicular to the lateral ventricles (series 96045, image 18). There is a prominent ring-like lesion at the posterior margin of the left lateral ventricle (series 40981, image 20). Small foci noted adjacent to the temporal horns of the lateral ventricles. No foci seen involving the brainstem or posterior fossa. Changes have progressed relative to 2017. While  these findings are nonspecific, overall distribution is highly suspicious for possible demyelinating disease. No abnormal restricted  diffusion or enhancement to suggest active demyelination at this time. No evidence for acute or subacute infarct. Gray-white matter differentiation maintained. No encephalomalacia to suggest remote cortical infarction. No foci of susceptibility artifact to suggest acute or chronic intracranial hemorrhage. No mass lesion, midline shift or mass effect. No hydrocephalus. No extra-axial fluid collection. Major dural sinuses are grossly patent. No abnormal enhancement within the brain. Incidental note made of a tiny DVA within the right frontal lobe. Pituitary gland suprasellar region normal. Midline structures intact and normal. Vascular: Major intracranial vascular flow voids are well maintained. Skull and upper cervical spine: Craniocervical junction within normal limits. Upper cervical spine normal. Bone marrow signal intensity within normal limits. No scalp soft tissue abnormality. Other: Bilateral mastoid effusions noted, left greater than right. Inner ear structures grossly normal. MRI ORBITS FINDINGS Orbits: Globes are symmetric in size with normal appearance and morphology bilaterally. Intraconal and extraconal fat well-maintained. Extra-ocular muscles symmetric and within normal limits. Optic nerves are symmetric in size without acute abnormality. No definite intra nerve edema or enhancement to suggest acute optic neuritis. Superior orbital veins symmetric and normal. No abnormality seen at the orbital apices. Optic chiasm normally position within the suprasellar cistern. Lacrimal glands normal. Visualized sinuses: Mild scattered mucosal thickening within the ethmoidal air cells and maxillary sinuses. Visualized paranasal sinuses are otherwise clear. Soft tissues: Periorbital soft tissues within normal limits. IMPRESSION: 1. Multiple T2/FLAIR hyperintense foci involving the  periventricular, deep, and subcortical white matter of both cerebral hemispheres in a distribution highly suspicious for possible demyelinating disease/multiple sclerosis. Changes have progressed relative to 2017. No evidence for active demyelination. 2. Negative MRI of the orbits. No findings to suggest acute optic neuritis or other abnormality identified. 3. Bilateral mastoid effusions, left greater than right. Electronically Signed   By: Rise Mu M.D.   On: 03/29/2017 03:00   Mr Rockwell [place] ZO Contrast  Result Date: 03/29/2017 CLINICAL DATA:  Initial evaluation for acute left upper eyelid drooping with foggy central vision for 2 days, left-sided headache. EXAM: MRI HEAD AND ORBITS WITHOUT AND WITH CONTRAST TECHNIQUE: Multiplanar, multiecho pulse sequences of the brain and surrounding structures were obtained without and with intravenous contrast. Multiplanar, multiecho pulse sequences of the orbits and surrounding structures were obtained including fat saturation techniques, before and after intravenous contrast administration. CONTRAST:  20mL MULTIHANCE GADOBENATE DIMEGLUMINE 529 MG/ML IV SOLN COMPARISON:  Prior MRI from 05/11/2015. FINDINGS: MRI HEAD FINDINGS Brain: Cerebral volume within normal limits for age. There are several scattered T2/FLAIR hyperintense foci seen involving the periventricular, deep, and subcortical white matter of both cerebral hemispheres. Several of these foci are oriented perpendicular to the lateral ventricles (series 10960, image 18). There is a prominent ring-like lesion at the posterior margin of the left lateral ventricle (series [id number], image 20). Small foci noted adjacent to the temporal horns of the lateral ventricles. No foci seen involving the brainstem or posterior fossa. Changes have progressed relative to 2017. While these findings are nonspecific, overall distribution is highly suspicious for possible demyelinating disease. No abnormal restricted diffusion or  enhancement to suggest active demyelination at this time. No evidence for acute or subacute infarct. Gray-white matter differentiation maintained. No encephalomalacia to suggest remote cortical infarction. No foci of susceptibility artifact to suggest acute or chronic intracranial hemorrhage. No mass lesion, midline shift or mass effect. No hydrocephalus. No extra-axial fluid collection. Major dural sinuses are grossly patent. No abnormal enhancement within the brain. Incidental note made of a tiny DVA within the right frontal  lobe. Pituitary gland suprasellar region normal. Midline structures intact and normal. Vascular: Major intracranial vascular flow voids are well maintained. Skull and upper cervical spine: Craniocervical junction within normal limits. Upper cervical spine normal. Bone marrow signal intensity within normal limits. No scalp soft tissue abnormality. Other: Bilateral mastoid effusions noted, left greater than right. Inner ear structures grossly normal. MRI ORBITS FINDINGS Orbits: Globes are symmetric in size with normal appearance and morphology bilaterally. Intraconal and extraconal fat well-maintained. Extra-ocular muscles symmetric and within normal limits. Optic nerves are symmetric in size without acute abnormality. No definite intra nerve edema or enhancement to suggest acute optic neuritis. Superior orbital veins symmetric and normal. No abnormality seen at the orbital apices. Optic chiasm normally position within the suprasellar cistern. Lacrimal glands normal. Visualized sinuses: Mild scattered mucosal thickening within the ethmoidal air cells and maxillary sinuses. Visualized paranasal sinuses are otherwise clear. Soft tissues: Periorbital soft tissues within normal limits. IMPRESSION: 1. Multiple T2/FLAIR hyperintense foci involving the periventricular, deep, and subcortical white matter of both cerebral hemispheres in a distribution highly suspicious for possible demyelinating  disease/multiple sclerosis. Changes have progressed relative to 2017. No evidence for active demyelination. 2. Negative MRI of the orbits. No findings to suggest acute optic neuritis or other abnormality identified. 3. Bilateral mastoid effusions, left greater than right. Electronically Signed   By: Rise Mu M.D.   On: 03/29/2017 03:00    ____________________________________________   PROCEDURES  Procedure(s) performed:   Procedures  None ____________________________________________   INITIAL IMPRESSION / ASSESSMENT AND PLAN / ED COURSE  Pertinent labs & imaging results that were available during my care of the patient were reviewed by me and considered in my medical decision making (see chart for details).  Patient presents to the emergency department for evaluation of double vision.  He has a cranial nerve II palsy with preserved convergence and left ptosis.  Patient also with a left-sided headache.  Headache was mild but persistent.  No sudden onset, maximal intensity headache symptoms.  Plan for CT Angio of the head and neck per neurology recommendations.  Plan to follow this with MRI brain with and without contrast along with MRI orbits with and without contrast.   Patient labs and CT angio results reviewed. MRI brain and orbits are pending. Care transferred to Dr. Wilkie Aye who will f/u with Neurology with the results of the scans. EKG reviewed with no acute findings.  ____________________________________________  FINAL CLINICAL IMPRESSION(S) / ED DIAGNOSES  Final diagnoses:  3rd nerve palsy, partial, left     MEDICATIONS GIVEN DURING THIS VISIT:  Medications  acetaminophen (TYLENOL) tablet 650 mg (650 mg Oral Given 03/28/17 1913)  iopamidol (ISOVUE-370) 76 % injection (50 mLs  Contrast Given 03/28/17 1935)  LORazepam (ATIVAN) injection 1 mg (1 mg Intravenous Given 03/28/17 2353)  gadobenate dimeglumine (MULTIHANCE) injection 20 mL (20 mLs Intravenous Contrast Given  03/29/17 0129)     NEW OUTPATIENT MEDICATIONS STARTED DURING THIS VISIT:  Discharge Medication List as of 03/29/2017  3:27 AM    START taking these medications   Details  aspirin 81 MG chewable tablet Chew 1 tablet (81 mg total) by mouth daily., Starting Wed 03/29/2017, Print        Note:  This document was prepared using Dragon voice recognition software and may include unintentional dictation errors.  Alona Bene, MD Emergency Medicine    Barrett Holthaus, Arlyss Repress, MD 03/29/17 [phone]

## 2017-03-28 NOTE — ED Provider Notes (Signed)
Patient placed in Quick Look pathway, seen and evaluated   Chief Complaint:   HPI:   42 year old male with a h/o of DM Type I presenting with left upper eyelid drooping and foggy central vision x2 days.  He states that he has had a constant, aching left-sided headache for 2 days .  He denies neck pain or stiffness, fever, chills, right eyelid drooping or visual changes in the right eye, dizziness, or numbness. He was seen earlier today by Dr. Genia Del with [facility] who referred him to the ED for concern for PCOM aneursym vs prevascular 3rd nerve palsy.   ROS: Visual disturbances (foggy vision), headache, left eye drooping   Physical Exam:   Gen: No distress  Neuro: Awake and Alert  Skin: Warm    Focused Exam: CN II, and IV through XII are grossly intact. CN II palsy. Left upper eye eyelid is drooping.  5 out of 5 strength of the bilateral upper and lower extremities against resistance.  Negative Romberg.  Symmetric tandem gait.  Finger to nose is intact bilaterally.   Initiation of care has begun. The patient has been counseled on the process, plan, and necessity for staying for the completion/evaluation, and the remainder of the medical screening examination    Barkley Boards, PA-C 03/28/17 Yevette Edwards, MD 03/28/17 [id number]

## 2017-03-28 NOTE — ED Notes (Signed)
Patient transported to MRI 

## 2017-03-28 NOTE — ED Triage Notes (Signed)
Pt sent from eye dr with c/o left eye drooping and blurry vision -- left eye "wandering" does not follow right eye=---does not wear corrective lens, normal vision until Saturday night-- no trauma

## 2017-03-28 NOTE — ED Notes (Signed)
Pt. Stated, My doctor was calling so I don't have to sit here , I need a MRI they said. No orders with the pt. Explain to pt about our procedure, seeing the doctor and having an evaluation involving his symptoms

## 2017-03-29 ENCOUNTER — Encounter: Payer: Self-pay | Admitting: Neurology

## 2017-03-29 ENCOUNTER — Emergency Department (HOSPITAL_COMMUNITY): Payer: BLUE CROSS/BLUE SHIELD

## 2017-03-29 DIAGNOSIS — R51 Headache: Secondary | ICD-10-CM | POA: Diagnosis not present

## 2017-03-29 DIAGNOSIS — H748X2 Other specified disorders of left middle ear and mastoid: Secondary | ICD-10-CM | POA: Diagnosis not present

## 2017-03-29 MED ORDER — GADOBENATE DIMEGLUMINE 529 MG/ML IV SOLN
20.0000 mL | Freq: Once | INTRAVENOUS | Status: AC | PRN
  Administered 2017-03-29: 20 mL via INTRAVENOUS

## 2017-03-29 MED ORDER — ASPIRIN 81 MG PO CHEW: 81.0000 mg | CHEWABLE_TABLET | Freq: Every day | ORAL | 0 refills | Status: DC

## 2017-03-29 NOTE — ED Provider Notes (Signed)
Patient signed out pending MRI.  Noted to have a palsy of the left eye.  MRI with evidence of some chronic demyelination.  No active demyelination.  I discussed this with neurology.  Dr. 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  He does not feel his MRI explains his current symptoms.  Recommends baby aspirin and follow-up with neurology.  I discussed with the patient who is in agreement with plan.  Results for orders placed or performed during the hospital encounter of 03/28/17  Comprehensive metabolic panel  Result Value Ref Range   Sodium 136 135 - 145 mmol/L   Potassium 4.2 3.5 - 5.1 mmol/L   Chloride 102 101 - 111 mmol/L   CO2 26 22 - 32 mmol/L   Glucose, Bld 188 (H) 65 - 99 mg/dL   BUN 14 6 - 20 mg/dL   Creatinine, Ser 9.60 0.61 - 1.24 mg/dL   Calcium 8.9 8.9 - 45.4 mg/dL   Total Protein 7.2 6.5 - 8.1 g/dL   Albumin 3.9 3.5 - 5.0 g/dL   AST 20 15 - 41 U/L   ALT 20 17 - 63 U/L   Alkaline Phosphatase 69 38 - 126 U/L   Total Bilirubin 0.6 0.3 - 1.2 mg/dL   GFR calc non Af Amer >60 >60 mL/min   GFR calc Af Amer >60 >60 mL/min   Anion gap 8 5 - 15  CBC with Differential  Result Value Ref Range   WBC 6.1 4.0 - 10.5 K/uL   RBC 4.37 4.22 - 5.81 MIL/uL   Hemoglobin 12.8 (L) 13.0 - 17.0 g/dL   HCT 09.8 (L) 11.9 - 14.7 %   MCV 87.9 78.0 - 100.0 fL   MCH 29.3 26.0 - 34.0 pg   MCHC 33.3 30.0 - 36.0 g/dL   RDW 82.9 56.2 - 13.0 %   Platelets 283 150 - 400 K/uL   Neutrophils Relative % 69 %   Neutro Abs 4.2 1.7 - 7.7 K/uL   Lymphocytes Relative 19 %   Lymphs Abs 1.2 0.7 - 4.0 K/uL   Monocytes Relative 10 %   Monocytes Absolute 0.6 0.1 - 1.0 K/uL   Eosinophils Relative 2 %   Eosinophils Absolute 0.1 0.0 - 0.7 K/uL   Basophils Relative 0 %   Basophils Absolute 0.0 0.0 - 0.1 K/uL  TSH  Result Value Ref Range   TSH 3.493 0.350 - 4.500 uIU/mL   Ct Angio Head W Or Wo Contrast  Result Date: 03/28/2017 CLINICAL DATA:  Left facial drooping  and blurry vision for 2 days EXAM: CT ANGIOGRAPHY HEAD AND NECK TECHNIQUE: Multidetector CT imaging of the head and neck was performed using the standard protocol during bolus administration of intravenous contrast. Multiplanar CT image reconstructions and MIPs were obtained to evaluate the vascular anatomy. Carotid stenosis measurements (when applicable) are obtained utilizing NASCET criteria, using the distal internal carotid diameter as the denominator. CONTRAST:  <See Chart> ISOVUE-370 IOPAMIDOL (ISOVUE-370) INJECTION 76% COMPARISON:  Brain MRI 05/11/2015 FINDINGS: CT HEAD FINDINGS Brain: No mass lesion, intraparenchymal hemorrhage or extra-axial collection. No evidence of acute cortical infarct. 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 Vascular: Atherosclerotic calcification of the vertebral and internal carotid arteries at the skull base. No hyperdense vessel. Skull: Normal visualized skull base, calvarium and extracranial soft tissues. Sinuses/Orbits: No sinus fluid levels or advanced mucosal thickening. No mastoid effusion. Normal orbits. CTA NECK FINDINGS Aortic arch: There is no calcific atherosclerosis of the aortic arch. There is  no aneurysm, dissection or hemodynamically significant stenosis of the visualized ascending aorta and aortic arch. Conventional 3 vessel aortic branching pattern. The visualized proximal subclavian arteries are widely patent. Right carotid system: --Common carotid artery: Widely patent origin without common carotid artery dissection or aneurysm. --Internal carotid artery: No dissection, occlusion or aneurysm. No hemodynamically significant stenosis. --External carotid artery: No acute abnormality. Left carotid system: --Common carotid artery: Widely patent origin without common carotid artery dissection or aneurysm. --Internal carotid artery:No dissection, occlusion or aneurysm. No hemodynamically significant stenosis. --External carotid artery: No acute abnormality. Vertebral  arteries: Codominant configuration. Both origins are normal. No dissection, occlusion or flow-limiting stenosis to the vertebrobasilar confluence. Skeleton: There is no bony spinal canal stenosis. No lytic or blastic lesion. Other neck: Mildly enlarged heterogeneous thyroid gland. Upper chest: No pneumothorax or pleural effusion. No nodules or masses. CTA HEAD FINDINGS Anterior circulation: --Intracranial internal carotid arteries: Atherosclerotic calcification of the internal carotid arteries at the skull base without hemodynamically significant stenosis. --Anterior cerebral arteries: Normal. Hypoplastic left A1 segment, normal variant. Patent anterior communicating artery. --Middle cerebral arteries: Normal. --Posterior communicating arteries: Absent bilaterally. Posterior circulation: --Basilar artery: Normal. --Posterior cerebral arteries: Normal. --Superior cerebellar arteries: Normal. --Inferior cerebellar arteries: Normal anterior and posterior inferior cerebellar arteries. Venous sinuses: As permitted by contrast timing, patent. Anatomic variants: None Delayed phase: No parenchymal contrast enhancement. Review of the MIP images confirms the above findings. IMPRESSION: 1. No emergent large vessel occlusion or acute vascular abnormality of the head or neck. 2. No hemodynamically significant stenosis. 3. Mildly enlarged and heterogeneous thyroid gland may indicate multinodular goiter. Electronically Signed   By: Deatra Robinson M.D.   On: 03/28/2017 20:38   Ct Angio Neck W And/or Wo Contrast  Result Date: 03/28/2017 CLINICAL DATA:  Left facial drooping and blurry vision for 2 days EXAM: CT ANGIOGRAPHY HEAD AND NECK TECHNIQUE: Multidetector CT imaging of the head and neck was performed using the standard protocol during bolus administration of intravenous contrast. Multiplanar CT image reconstructions and MIPs were obtained to evaluate the vascular anatomy. Carotid stenosis measurements (when applicable) are  obtained utilizing NASCET criteria, using the distal internal carotid diameter as the denominator. CONTRAST:  <See Chart> ISOVUE-370 IOPAMIDOL (ISOVUE-370) INJECTION 76% COMPARISON:  Brain MRI 05/11/2015 FINDINGS: CT HEAD FINDINGS Brain: No mass lesion, intraparenchymal hemorrhage or extra-axial collection. No evidence of acute cortical infarct. 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 Vascular: Atherosclerotic calcification of the vertebral and internal carotid arteries at the skull base. No hyperdense vessel. Skull: Normal visualized skull base, calvarium and extracranial soft tissues. Sinuses/Orbits: No sinus fluid levels or advanced mucosal thickening. No mastoid effusion. Normal orbits. CTA NECK FINDINGS Aortic arch: There is no calcific atherosclerosis of the aortic arch. There is no aneurysm, dissection or hemodynamically significant stenosis of the visualized ascending aorta and aortic arch. Conventional 3 vessel aortic branching pattern. The visualized proximal subclavian arteries are widely patent. Right carotid system: --Common carotid artery: Widely patent origin without common carotid artery dissection or aneurysm. --Internal carotid artery: No dissection, occlusion or aneurysm. No hemodynamically significant stenosis. --External carotid artery: No acute abnormality. Left carotid system: --Common carotid artery: Widely patent origin without common carotid artery dissection or aneurysm. --Internal carotid artery:No dissection, occlusion or aneurysm. No hemodynamically significant stenosis. --External carotid artery: No acute abnormality. Vertebral arteries: Codominant configuration. Both origins are normal. No dissection, occlusion or flow-limiting stenosis to the vertebrobasilar confluence. Skeleton: There is no bony spinal canal stenosis. No lytic or blastic lesion. Other neck: Mildly enlarged  heterogeneous thyroid gland. Upper chest: No pneumothorax or pleural effusion. No nodules or masses. CTA HEAD  FINDINGS Anterior circulation: --Intracranial internal carotid arteries: Atherosclerotic calcification of the internal carotid arteries at the skull base without hemodynamically significant stenosis. --Anterior cerebral arteries: Normal. Hypoplastic left A1 segment, normal variant. Patent anterior communicating artery. --Middle cerebral arteries: Normal. --Posterior communicating arteries: Absent bilaterally. Posterior circulation: --Basilar artery: Normal. --Posterior cerebral arteries: Normal. --Superior cerebellar arteries: Normal. --Inferior cerebellar arteries: Normal anterior and posterior inferior cerebellar arteries. Venous sinuses: As permitted by contrast timing, patent. Anatomic variants: None Delayed phase: No parenchymal contrast enhancement. Review of the MIP images confirms the above findings. IMPRESSION: 1. No emergent large vessel occlusion or acute vascular abnormality of the head or neck. 2. No hemodynamically significant stenosis. 3. Mildly enlarged and heterogeneous thyroid gland may indicate multinodular goiter. Electronically Signed   By: Deatra Robinson M.D.   On: 03/28/2017 20:38   Mr Laqueta Jean And Wo Contrast  Result Date: 03/29/2017 CLINICAL DATA:  Initial evaluation for acute left upper eyelid drooping with foggy central vision for 2 days, left-sided headache. EXAM: MRI HEAD AND ORBITS WITHOUT AND WITH CONTRAST TECHNIQUE: Multiplanar, multiecho pulse sequences of the brain and surrounding structures were obtained without and with intravenous contrast. Multiplanar, multiecho pulse sequences of the orbits and surrounding structures were obtained including fat saturation techniques, before and after intravenous contrast administration. CONTRAST:  20mL MULTIHANCE GADOBENATE DIMEGLUMINE 529 MG/ML IV SOLN COMPARISON:  Prior MRI from 05/11/2015. FINDINGS: MRI HEAD FINDINGS Brain: Cerebral volume within normal limits for age. There are several scattered T2/FLAIR hyperintense foci seen involving  the periventricular, deep, and subcortical white matter of both cerebral hemispheres. Several of these foci are oriented perpendicular to the lateral ventricles (series 16109, image 18). There is a prominent ring-like lesion at the posterior margin of the left lateral ventricle (series 60454, image 20). Small foci noted adjacent to the temporal horns of the lateral ventricles. No foci seen involving the brainstem or posterior fossa. Changes have progressed relative to 2017. While these findings are nonspecific, overall distribution is highly suspicious for possible demyelinating disease. No abnormal restricted diffusion or enhancement to suggest active demyelination at this time. No evidence for acute or subacute infarct. Gray-white matter differentiation maintained. No encephalomalacia to suggest remote cortical infarction. No foci of susceptibility artifact to suggest acute or chronic intracranial hemorrhage. No mass lesion, midline shift or mass effect. No hydrocephalus. No extra-axial fluid collection. Major dural sinuses are grossly patent. No abnormal enhancement within the brain. Incidental note made of a tiny DVA within the right frontal lobe. Pituitary gland suprasellar region normal. Midline structures intact and normal. Vascular: Major intracranial vascular flow voids are well maintained. Skull and upper cervical spine: Craniocervical junction within normal limits. Upper cervical spine normal. Bone marrow signal intensity within normal limits. No scalp soft tissue abnormality. Other: Bilateral mastoid effusions noted, left greater than right. Inner ear structures grossly normal. MRI ORBITS FINDINGS Orbits: Globes are symmetric in size with normal appearance and morphology bilaterally. Intraconal and extraconal fat well-maintained. Extra-ocular muscles symmetric and within normal limits. Optic nerves are symmetric in size without acute abnormality. No definite intra nerve edema or enhancement to suggest  acute optic neuritis. Superior orbital veins symmetric and normal. No abnormality seen at the orbital apices. Optic chiasm normally position within the suprasellar cistern. Lacrimal glands normal. Visualized sinuses: Mild scattered mucosal thickening within the ethmoidal air cells and maxillary sinuses. Visualized paranasal sinuses are otherwise clear. Soft tissues: Periorbital soft tissues  within normal limits. IMPRESSION: 1. Multiple T2/FLAIR hyperintense foci involving the periventricular, deep, and subcortical white matter of both cerebral hemispheres in a distribution highly suspicious for possible demyelinating disease/multiple sclerosis. Changes have progressed relative to 2017. No evidence for active demyelination. 2. Negative MRI of the orbits. No findings to suggest acute optic neuritis or other abnormality identified. 3. Bilateral mastoid effusions, left greater than right. Electronically Signed   By: Rise Mu M.D.   On: 03/29/2017 03:00   Mr Rockwell [place] WU Contrast  Result Date: 03/29/2017 CLINICAL DATA:  Initial evaluation for acute left upper eyelid drooping with foggy central vision for 2 days, left-sided headache. EXAM: MRI HEAD AND ORBITS WITHOUT AND WITH CONTRAST TECHNIQUE: Multiplanar, multiecho pulse sequences of the brain and surrounding structures were obtained without and with intravenous contrast. Multiplanar, multiecho pulse sequences of the orbits and surrounding structures were obtained including fat saturation techniques, before and after intravenous contrast administration. CONTRAST:  20mL MULTIHANCE GADOBENATE DIMEGLUMINE 529 MG/ML IV SOLN COMPARISON:  Prior MRI from 05/11/2015. FINDINGS: MRI HEAD FINDINGS Brain: Cerebral volume within normal limits for age. There are several scattered T2/FLAIR hyperintense foci seen involving the periventricular, deep, and subcortical white matter of both cerebral hemispheres. Several of these foci are oriented perpendicular to the lateral  ventricles (series [id number], image 18). There is a prominent ring-like lesion at the posterior margin of the left lateral ventricle (series 14782, image 20). Small foci noted adjacent to the temporal horns of the lateral ventricles. No foci seen involving the brainstem or posterior fossa. Changes have progressed relative to 2017. While these findings are nonspecific, overall distribution is highly suspicious for possible demyelinating disease. No abnormal restricted diffusion or enhancement to suggest active demyelination at this time. No evidence for acute or subacute infarct. Gray-white matter differentiation maintained. No encephalomalacia to suggest remote cortical infarction. No foci of susceptibility artifact to suggest acute or chronic intracranial hemorrhage. No mass lesion, midline shift or mass effect. No hydrocephalus. No extra-axial fluid collection. Major dural sinuses are grossly patent. No abnormal enhancement within the brain. Incidental note made of a tiny DVA within the right frontal lobe. Pituitary gland suprasellar region normal. Midline structures intact and normal. Vascular: Major intracranial vascular flow voids are well maintained. Skull and upper cervical spine: Craniocervical junction within normal limits. Upper cervical spine normal. Bone marrow signal intensity within normal limits. No scalp soft tissue abnormality. Other: Bilateral mastoid effusions noted, left greater than right. Inner ear structures grossly normal. MRI ORBITS FINDINGS Orbits: Globes are symmetric in size with normal appearance and morphology bilaterally. Intraconal and extraconal fat well-maintained. Extra-ocular muscles symmetric and within normal limits. Optic nerves are symmetric in size without acute abnormality. No definite intra nerve edema or enhancement to suggest acute optic neuritis. Superior orbital veins symmetric and normal. No abnormality seen at the orbital apices. Optic chiasm normally position within the  suprasellar cistern. Lacrimal glands normal. Visualized sinuses: Mild scattered mucosal thickening within the ethmoidal air cells and maxillary sinuses. Visualized paranasal sinuses are otherwise clear. Soft tissues: Periorbital soft tissues within normal limits. IMPRESSION: 1. Multiple T2/FLAIR hyperintense foci involving the periventricular, deep, and subcortical white matter of both cerebral hemispheres in a distribution highly suspicious for possible demyelinating disease/multiple sclerosis. Changes have progressed relative to 2017. No evidence for active demyelination. 2. Negative MRI of the orbits. No findings to suggest acute optic neuritis or other abnormality identified. 3. Bilateral mastoid effusions, left greater than right. Electronically Signed   By: Janell Quiet.D.  On: 03/29/2017 03:00      Shon Baton, MD 03/29/17 [phone]

## 2017-03-29 NOTE — Discharge Instructions (Signed)
You were seen today for an eye palsy.  This is likely related to your diabetes.  Follow-up with neurology.

## 2017-03-29 NOTE — ED Notes (Signed)
Patient is resting comfortably. 

## 2017-04-20 DIAGNOSIS — Z794 Long term (current) use of insulin: Secondary | ICD-10-CM | POA: Diagnosis not present

## 2017-04-20 DIAGNOSIS — E109 Type 1 diabetes mellitus without complications: Secondary | ICD-10-CM | POA: Diagnosis not present

## 2017-04-21 DIAGNOSIS — Z794 Long term (current) use of insulin: Secondary | ICD-10-CM | POA: Diagnosis not present

## 2017-04-21 DIAGNOSIS — E109 Type 1 diabetes mellitus without complications: Secondary | ICD-10-CM | POA: Diagnosis not present

## 2017-04-25 ENCOUNTER — Ambulatory Visit: Payer: BLUE CROSS/BLUE SHIELD | Admitting: Neurology

## 2017-04-25 ENCOUNTER — Encounter: Payer: Self-pay | Admitting: Neurology

## 2017-04-25 VITALS — BP 136/90 | HR 104 | Ht 72.0 in | Wt 333.0 lb

## 2017-04-25 DIAGNOSIS — E10610 Type 1 diabetes mellitus with diabetic neuropathic arthropathy: Secondary | ICD-10-CM

## 2017-04-25 DIAGNOSIS — E1065 Type 1 diabetes mellitus with hyperglycemia: Secondary | ICD-10-CM

## 2017-04-25 DIAGNOSIS — R9082 White matter disease, unspecified: Secondary | ICD-10-CM | POA: Diagnosis not present

## 2017-04-25 DIAGNOSIS — H4902 Third [oculomotor] nerve palsy, left eye: Secondary | ICD-10-CM | POA: Diagnosis not present

## 2017-04-25 DIAGNOSIS — IMO0002 Reserved for concepts with insufficient information to code with codable children: Secondary | ICD-10-CM

## 2017-04-25 MED ORDER — DIAZEPAM 10 MG PO TABS: ORAL_TABLET | ORAL | 0 refills | Status: DC

## 2017-04-25 NOTE — Progress Notes (Signed)
NEUROLOGY CONSULTATION NOTE  YAKUB LODES MRN: [id number] DOB: [birth year]  Referring provider: Thayer Jew, MD (ED referral) Primary care provider: Mellody Dance, DO  Reason for consult:  Third nerve palsy  HISTORY OF PRESENT ILLNESS: Michael Hutchinson is a 42 year old right-handed male with hypertension, type 1 diabetes mellitus with history of DKA, Charcot's joint of foot, left below the knee amputation, hyperlipidemia and GERD who presents for left third nerve palsy.  He is accompanied by his wife who supplements history.  In October 2016, he developed a right sixth cranial nerve palsy, which was thought to be due to his uncontrolled type I diabetes.    In March 2017, he developed blurred vision in the left eye.  He saw ophthalmologist, Dr. Julian Reil, who diagnosed optic neuritis.  MRI of brain and orbits with and without contrast from 05/11/15 was personally reviewed and revealed "2 T2 hyperintense lesions within the left hemisphere adjacent to the posterior horn of the left lateral ventricle", a nonspecific finding and without abnormal contrast enhancement.    In March, he developed dysconjugate gaze and droopy left eyelid.  He saw the eye doctor who diagnosed a left third nerve palsy.  He was evaluated in the ED at [facility] on 03/28/17.  CTA of head was negative for aneurysm.  MRI of brain with and without contrast was personally reviewed and demonstrated multiple T2/FLAIR hyperintense foci involving the periventricular, deep and subcortical white matter, progressed compared to prior MRI from 2017.  In-house neurology believed his findings more likely due to a diabetic third nerve palsy rather than MS.  He was advised to start ASA '81mg'$  daily and follow up with outpatient neurology.  Labs: 03/28/17:  CBC with WBC 6.1, HGB 12.8, HCT 38.4, PLT 283; TSH 3.493; CMP with Na 136, K 4.2, Cl 102, CO2 26, glucose 188, BUN 14, Cr 1.19, t bili 0.6, ALP 69, AST 20 and ALT  20. 12/26/16: Hgb A1c 9.2.  PAST MEDICAL HISTORY: Past Medical History:  Diagnosis Date  . Charcot's joint of foot 11/25/2013  . Complication of anesthesia    "I wake up angry" (12/31/2015)  . Diabetes mellitus type 1 ([place]) dx'd 1981  . Diabetic ketoacidosis ([place])   . Essential hypertension 05/03/2013  . GERD (gastroesophageal reflux disease)   . High cholesterol   . Hx MRSA infection    Inner thigh and under arm- healed areas  . Meniscus tear   . Shortness of breath    with exertion only    PAST SURGICAL HISTORY: Past Surgical History:  Procedure Laterality Date  . AMPUTATION Left 01/01/2016   Procedure: AMPUTATION BELOW KNEE;  Surgeon: [name], MD;  Location: [place];  Service: Orthopedics;  Laterality: Left;  . APPLICATION OF WOUND VAC  04/26/2016  . FRACTURE SURGERY    . HERNIA REPAIR    . I&D EXTREMITY Left 04/26/2016   Procedure: IRRIGATION AND DEBRIDEMENT EXTREMITY/Left Leg/Possible Wound Vac;  Surgeon: [name], MD;  Location: [place];  Service: Orthopedics;  Laterality: Left;  . INCISE AND DRAIN ABCESS Left 04/26/2016  . KNEE ARTHROSCOPY Left ~ 2010  . LAPAROSCOPIC CHOLECYSTECTOMY  2015  . METACARPOPHALANGEAL JOINT ARTHRODESIS Left 06/2012   Fracture left index finger intra-articular MCP joint/notes 06/30/2012  . OPEN REDUCTION INTERNAL FIXATION (ORIF) PROXIMAL PHALANX Left 06/30/2012   Procedure: OPEN REDUCTION INTERNAL FIXATION (ORIF) LEFT INDEX FINGER PROXIMAL PHALANX FRACTURE WITH LIGAMENT REPAIR AS NECESSARY;  Surgeon: [name], MD;  Location: [place];  Service: Orthopedics;  Laterality: Left;  . UMBILICAL HERNIA REPAIR  2015   "w/gallbladder OR"    MEDICATIONS: Current Outpatient Medications on File Prior to Visit  Medication Sig Dispense Refill  . aspirin 81 MG chewable tablet Chew 1 tablet (81 mg total) by mouth daily. (Patient not taking: Reported on 04/25/2017) 30 tablet 0  . BAYER MICROLET LANCETS lancets Use as instructed to check 3 times daily.  100 each 12  . Blood Glucose Monitoring Suppl (ONE TOUCH ULTRA SYSTEM KIT) w/Device KIT 1 kit by Does not apply route once. Check blood sugar 3 times daily. Diagnosis Diabetes ICD-10 E11.8    . glucagon (GLUCAGON EMERGENCY) 1 MG injection Inject 1 mg into the muscle once as needed. (Patient taking differently: Inject 1 mg into the muscle daily as needed (hypoglycemia). ) 1 each 11  . glucose blood (BAYER CONTOUR NEXT TEST) test strip 1 each by Other route 4 (four) times daily. And lancets 4/day 400 each 3  . ibuprofen (ADVIL,MOTRIN) 800 MG tablet Take 1 tablet (800 mg total) by mouth 3 (three) times daily. 21 tablet 0  . insulin lispro (HUMALOG) 100 UNIT/ML injection Use 60 units a day via insulin pump. 3 vial 11   No current facility-administered medications on file prior to visit.     ALLERGIES: Allergies  Allergen Reactions  . Influenza Vac Split Quad Nausea And Vomiting  . Hydrochlorothiazide     Dizziness  . Lisinopril Cough  . Losartan     Dizziness    FAMILY HISTORY: Family History  Problem Relation Age of Onset  . Other Mother   . Cancer Mother        Breast / Bone  . Heart attack Father   . Hypertension Father   . Hyperlipidemia Father   . Diabetes Unknown   . Alcohol abuse Sister   . Diabetes Maternal Grandfather   . Stroke Paternal Grandmother   . Alcohol abuse Paternal Grandfather     SOCIAL HISTORY: Social History   Socioeconomic History  . Marital status: Married    Spouse name: Not on file  . Number of children: Not on file  . Years of education: Not on file  . Highest education level: Not on file  Occupational History  . Not on file  Social Needs  . Financial resource strain: Not on file  . Food insecurity:    Worry: Not on file    Inability: Not on file  . Transportation needs:    Medical: Not on file    Non-medical: Not on file  Tobacco Use  . Smoking status: Never Smoker  . Smokeless tobacco: Former Systems developer    Types: Snuff, Chew  Substance  and Sexual Activity  . Alcohol use: Yes    Alcohol/week: 8.4 oz    Types: 14 Cans of beer per week  . Drug use: No  . Sexual activity: Yes  Lifestyle  . Physical activity:    Days per week: Not on file    Minutes per session: Not on file  . Stress: Not on file  Relationships  . Social connections:    Talks on phone: Not on file    Gets together: Not on file    Attends religious service: Not on file    Active member of club or organization: Not on file    Attends meetings of clubs or organizations: Not on file    Relationship status: Not on file  . Intimate partner violence:    Fear  of current or ex partner: Not on file    Emotionally abused: Not on file    Physically abused: Not on file    Forced sexual activity: Not on file  Other Topics Concern  . Not on file  Social History Narrative  . Not on file    REVIEW OF SYSTEMS: Constitutional: No fevers, chills, or sweats, no generalized fatigue, change in appetite Eyes: No visual changes, double vision, eye pain Ear, nose and throat: No hearing loss, ear pain, nasal congestion, sore throat Cardiovascular: No chest pain, palpitations Respiratory:  No shortness of breath at rest or with exertion, wheezes GastrointestinaI: No nausea, vomiting, diarrhea, abdominal pain, fecal incontinence Genitourinary:  No dysuria, urinary retention or frequency Musculoskeletal:  No neck pain, back pain Integumentary: No rash, pruritus, skin lesions Neurological: as above Psychiatric: No depression, insomnia, anxiety Endocrine: No palpitations, fatigue, diaphoresis, mood swings, change in appetite, change in weight, increased thirst Hematologic/Lymphatic:  No purpura, petechiae. Allergic/Immunologic: no itchy/runny eyes, nasal congestion, recent allergic reactions, rashes  PHYSICAL EXAM: Vitals:   04/25/17 1512  BP: 136/90  Pulse: (!) 104  SpO2: 97%   General: No acute distress.  Head:  Normocephalic/atraumatic Eyes:  fundi examined  but not visualized Neck: supple, no paraspinal tenderness, full range of motion Back: No paraspinal tenderness Heart: regular rate and rhythm Lungs: Clear to auscultation bilaterally. Vascular: No carotid bruits. Neurological Exam: Mental status: alert and oriented to person, place, and time, recent and remote memory intact, fund of knowledge intact, attention and concentration intact, speech fluent and not dysarthric, language intact. Cranial nerves: CN I: not tested CN II: pupils equal, round and reactive to light, visual fields intact CN III, IV, VI:  Left eye depressed and abducted on primary gaze, no nystagmus, left ptosis CN V: facial sensation intact CN VII: upper and lower face symmetric CN VIII: hearing intact CN IX, X: gag intact, uvula midline CN XI: sternocleidomastoid and trapezius muscles intact CN XII: tongue midline Bulk & Tone: normal, no fasciculations. Motor:  5/5 throughout Sensation: temperature sensation intact and vibration sensation reduced in right lower extremity. Deep Tendon Reflexes:  absent throughout, toes in right foot downgoing. Finger to nose testing:  Without dysmetria.  Gait:  Normal station and stride. Romberg negative.  IMPRESSION: Left third nerve palsy Abnormal white matter findings on brain MRI Morbid obesity (BMI 45.16 kg/m2)  The third nerve palsy can be explained by diabetes and MRI findings may be explained by his cerebrovascular risk factors.  However, given the history of sixth nerve palsy and optic neuritis, in addition to progression of white matter findings on MRI, workup for multiple sclerosis is warranted.  PLAN: 1.  We will first check MRI of cervical spine with and without contrast. 2.  Then we will order a spinal tap, checking spinal fluid for cell count, protein, glucose, oligoclonal bands, IgG index, ACE, gram stain and culture. 3.  Weight loss 4.  Follow up after testing.  Thank you for allowing me to take part in the care  of this patient.  Metta Clines, DO  CC: Mellody Dance, DO

## 2017-04-25 NOTE — Progress Notes (Signed)
Called [facility], Roberta's exr. left detailed message with all info concerning LP. Should be scheduled after the MRI c-spine. Asked Jenel Lucks to rtrn my call. Also faxed LP request

## 2017-04-25 NOTE — Patient Instructions (Addendum)
1.  We will first check MRI of cervical spine with and without contrast.  To help with anxiety, take diazepam 10mg  30 to 40 minutes prior to MRI.  May take another one just before MRI if needed.  You must have a driver to and from the MRI. 2.  Then we will order a spinal tap, checking spinal fluid for cell count, protein, glucose, oligoclonal bands, IgG index, ACE, gram stain and culture. 3.  Follow up after testing.

## 2017-05-01 DIAGNOSIS — Z89512 Acquired absence of left leg below knee: Secondary | ICD-10-CM | POA: Diagnosis not present

## 2017-05-03 ENCOUNTER — Ambulatory Visit
Admission: RE | Admit: 2017-05-03 | Discharge: 2017-05-03 | Disposition: A | Discharge status: Home / Self Care | Payer: BLUE CROSS/BLUE SHIELD | Source: Ambulatory Visit | Attending: Neurology | Admitting: Neurology

## 2017-05-03 DIAGNOSIS — R9082 White matter disease, unspecified: Secondary | ICD-10-CM | POA: Diagnosis not present

## 2017-05-03 DIAGNOSIS — H4902 Third [oculomotor] nerve palsy, left eye: Secondary | ICD-10-CM

## 2017-05-03 MED ORDER — GADOBENATE DIMEGLUMINE 529 MG/ML IV SOLN
20.0000 mL | Freq: Once | INTRAVENOUS | Status: AC | PRN
  Administered 2017-05-03: 20 mL via INTRAVENOUS

## 2017-05-04 ENCOUNTER — Telehealth: Payer: Self-pay | Admitting: Neurology

## 2017-05-04 ENCOUNTER — Telehealth: Payer: Self-pay

## 2017-05-04 NOTE — Telephone Encounter (Signed)
Called [facility], LMOVM again for [place]. Advsd her Pt has had MRI, please proceed with scheduling LP.  Called Pt, he said he has [facility] number, and will call them.

## 2017-05-04 NOTE — Telephone Encounter (Signed)
-----   Message from Drema Dallas, DO sent at 05/04/2017 10:07 AM EDT ----- MRI of cervical spine is normal.  Plan is to proceed with lumbar puncture for CSF analysis

## 2017-05-04 NOTE — Telephone Encounter (Signed)
Called and spoke with Pt, advsd of normal C-spine MRI result.

## 2017-05-04 NOTE — Telephone Encounter (Signed)
Patient called and lmom regarding having his MRI Cervical and he saw on my chart where he is to schedule a Spinal Lumbar Puncture? He would like to speak with you regarding getting that scheduled. Thanks

## 2017-05-19 ENCOUNTER — Ambulatory Visit
Admission: RE | Admit: 2017-05-19 | Discharge: 2017-05-19 | Disposition: A | Discharge status: Home / Self Care | Payer: BLUE CROSS/BLUE SHIELD | Source: Ambulatory Visit | Attending: Neurology | Admitting: Neurology

## 2017-05-19 VITALS — BP 127/80 | HR 93

## 2017-05-19 DIAGNOSIS — R9082 White matter disease, unspecified: Secondary | ICD-10-CM

## 2017-05-19 DIAGNOSIS — H4902 Third [oculomotor] nerve palsy, left eye: Secondary | ICD-10-CM

## 2017-05-19 DIAGNOSIS — H539 Unspecified visual disturbance: Secondary | ICD-10-CM | POA: Diagnosis not present

## 2017-05-19 NOTE — Discharge Instructions (Signed)

## 2017-05-19 NOTE — Progress Notes (Signed)
One SST tube of blood drawn from right AC space for LP labs; site unremarkable. 

## 2017-05-24 ENCOUNTER — Telehealth: Payer: Self-pay | Admitting: Neurology

## 2017-05-24 NOTE — Telephone Encounter (Signed)
Pt called and wants to know the results of his spinal tap

## 2017-05-25 LAB — CSF CELL COUNT WITH DIFFERENTIAL
RBC Count, CSF: 1600 cells/uL — ABNORMAL HIGH (ref 0–10)
WBC, CSF: 1 cells/uL (ref 0–5)

## 2017-05-25 LAB — CSF CULTURE: RESULT: NO GROWTH

## 2017-05-25 LAB — MULTIPLE SCLEROSIS PANEL 2
ALBUMIN SERUM: 3.7 g/dL (ref 3.5–5.2)
Albumin, CSF: 82 mg/dL — ABNORMAL HIGH (ref 8.0–42.0)
CNS-IgG Synthesis Rate: 40.4 mg/24 h — ABNORMAL HIGH (ref <=3.3)
IGG TOTAL CSF: 19.8 mg/dL — AB (ref 0.8–7.7)
IgG (Immunoglobin G), Serum: 1130 mg/dL (ref 694–1618)
IgG-Index: 0.79 — ABNORMAL HIGH (ref <0.66)
Myelin Basic Protein: <2 mcg/L (ref 2.0–4.0)

## 2017-05-25 LAB — GLUCOSE, CSF: GLUCOSE CSF: 100 mg/dL — AB (ref 40–80)

## 2017-05-25 LAB — CSF CULTURE W GRAM STAIN
MICRO NUMBER:: 90572810
SPECIMEN QUALITY:: ADEQUATE

## 2017-05-25 LAB — PROTEIN, CSF: TOTAL PROTEIN, CSF: 157 mg/dL — AB (ref 15–45)

## 2017-05-25 LAB — ANGIOTENSIN CONVERTING ENZYME, CSF: ACE, CSF: 4 U/L (ref <=15)

## 2017-05-25 NOTE — Telephone Encounter (Signed)
Called and spoke with Pt, he is to come in tomorrow at 3:30p and discuss with Dr Everlena Cooper

## 2017-05-26 ENCOUNTER — Ambulatory Visit (INDEPENDENT_AMBULATORY_CARE_PROVIDER_SITE_OTHER): Payer: BLUE CROSS/BLUE SHIELD | Admitting: Neurology

## 2017-05-26 ENCOUNTER — Encounter: Payer: Self-pay | Admitting: Neurology

## 2017-05-26 ENCOUNTER — Other Ambulatory Visit: Payer: BLUE CROSS/BLUE SHIELD

## 2017-05-26 VITALS — BP 124/90 | HR 107 | Ht 72.0 in | Wt 340.0 lb

## 2017-05-26 DIAGNOSIS — Z1321 Encounter for screening for nutritional disorder: Secondary | ICD-10-CM

## 2017-05-26 DIAGNOSIS — Z79899 Other long term (current) drug therapy: Secondary | ICD-10-CM

## 2017-05-26 DIAGNOSIS — G35 Multiple sclerosis: Secondary | ICD-10-CM

## 2017-05-26 NOTE — Progress Notes (Signed)
NEUROLOGY FOLLOW UP OFFICE NOTE  [name] [id number]  HISTORY OF PRESENT ILLNESS: Michael Hutchinson is a 42 year old right-handed male with hypertension, type 1 diabetes mellitus with history of DKA, Charcot's joint of foot, left below the knee amputation, hyperlipidemia and GERD who follows up for left third nerve palsy.  He is accompanied by his wife who supplements history.   UPDATE: Michael Hutchinson underwent workup for multiple sclerosis: MRI of cervical spine with and without contrast from 05/04/17 was personally reviewed and revealed no cord lesions.  He underwent lumbar puncture on 05/19/17 for CSF analysis, which revealed elevated IgG Index of 0.79 and 4 oligoclonal bands not present in the corresponding serum.  Cell count was 1, elevated protein 157, elevated glucose 100, myelin basic protein negative, ACE 4, gram stain and culture negative.   HISTORY: In October 2016, he developed a right sixth cranial nerve palsy, which was thought to be due to his uncontrolled type I diabetes.     In March 2017, he developed blurred vision in the left eye.  He saw ophthalmologist, Dr. Julian Reil, who diagnosed optic neuritis.  MRI of brain and orbits with and without contrast from 05/11/15 was personally reviewed and revealed "2 T2 hyperintense lesions within the left hemisphere adjacent to the posterior horn of the left lateral ventricle", a nonspecific finding and without abnormal contrast enhancement.     In March, he developed dysconjugate gaze and droopy left eyelid.  He saw the eye doctor who diagnosed a left third nerve palsy.  He was evaluated in the ED at [facility] on 03/28/17.  CTA of head was negative for aneurysm.  MRI of brain with and without contrast was personally reviewed and demonstrated multiple T2/FLAIR hyperintense foci involving the periventricular, deep and subcortical white matter, progressed compared to prior MRI from 2017.  In-house neurology believed his  findings more likely due to a diabetic third nerve palsy rather than MS.  He was advised to start ASA '81mg'$  daily and follow up with outpatient neurology.  03/28/17:  CMP normal except for glucose level 188.  PAST MEDICAL HISTORY: Past Medical History:  Diagnosis Date  . Charcot's joint of foot 11/25/2013  . Complication of anesthesia    "I wake up angry" (12/31/2015)  . Diabetes mellitus type 1 ([place]) dx'd 1981  . Diabetic ketoacidosis ([place])   . Essential hypertension 05/03/2013  . GERD (gastroesophageal reflux disease)   . High cholesterol   . Hx MRSA infection    Inner thigh and under arm- healed areas  . Meniscus tear   . Shortness of breath    with exertion only    MEDICATIONS: Current Outpatient Medications on File Prior to Visit  Medication Sig Dispense Refill  . aspirin 81 MG chewable tablet Chew 1 tablet (81 mg total) by mouth daily. (Patient not taking: Reported on 04/25/2017) 30 tablet 0  . BAYER MICROLET LANCETS lancets Use as instructed to check 3 times daily. 100 each 12  . Blood Glucose Monitoring Suppl (ONE TOUCH ULTRA SYSTEM KIT) w/Device KIT 1 kit by Does not apply route once. Check blood sugar 3 times daily. Diagnosis Diabetes ICD-10 E11.8    . diazepam (VALIUM) 10 MG tablet Take 1 tablet 30 to 40 minutes prior to MRI.  May take 1 tablet just before MRI if needed. 2 tablet 0  . glucagon (GLUCAGON EMERGENCY) 1 MG injection Inject 1 mg into the muscle once as needed. (Patient taking differently: Inject 1  mg into the muscle daily as needed (hypoglycemia). ) 1 each 11  . glucose blood (BAYER CONTOUR NEXT TEST) test strip 1 each by Other route 4 (four) times daily. And lancets 4/day 400 each 3  . ibuprofen (ADVIL,MOTRIN) 800 MG tablet Take 1 tablet (800 mg total) by mouth 3 (three) times daily. 21 tablet 0  . insulin lispro (HUMALOG) 100 UNIT/ML injection Use 60 units a day via insulin pump. 3 vial 11   No current facility-administered medications on file prior to visit.       ALLERGIES: Allergies  Allergen Reactions  . Influenza Vac Split Quad Nausea And Vomiting  . Hydrochlorothiazide Other (See Comments)    Dizziness  . Lisinopril Cough  . Losartan Other (See Comments)    Dizziness    FAMILY HISTORY: Family History  Problem Relation Age of Onset  . Other Mother   . Cancer Mother        Breast / Bone  . Heart attack Father   . Hypertension Father   . Hyperlipidemia Father   . Diabetes Unknown   . Alcohol abuse Sister   . Diabetes Maternal Grandfather   . Stroke Paternal Grandmother   . Alcohol abuse Paternal Grandfather     SOCIAL HISTORY: Social History   Socioeconomic History  . Marital status: Married    Spouse name: Estill Bamberg  . Number of children: 2  . Years of education: Not on file  . Highest education level: High school graduate  Occupational History  . Occupation: Dealer  Social Needs  . Financial resource strain: Not on file  . Food insecurity:    Worry: Not on file    Inability: Not on file  . Transportation needs:    Medical: Not on file    Non-medical: Not on file  Tobacco Use  . Smoking status: Never Smoker  . Smokeless tobacco: Former Systems developer    Types: Snuff, Chew  Substance and Sexual Activity  . Alcohol use: Yes    Alcohol/week: 8.4 oz    Types: 14 Cans of beer per week  . Drug use: No  . Sexual activity: Yes  Lifestyle  . Physical activity:    Days per week: Not on file    Minutes per session: Not on file  . Stress: Not on file  Relationships  . Social connections:    Talks on phone: Not on file    Gets together: Not on file    Attends religious service: Not on file    Active member of club or organization: Not on file    Attends meetings of clubs or organizations: Not on file    Relationship status: Not on file  . Intimate partner violence:    Fear of current or ex partner: Not on file    Emotionally abused: Not on file    Physically abused: Not on file    Forced sexual activity: Not on file   Other Topics Concern  . Not on file  Social History Narrative   Pt is a right handed married man, lives with his wife and 2 children ages 21 and 45, in a single story house. Pt drinks 6-8, 16oz diet sodas a day.     REVIEW OF SYSTEMS: Constitutional: No fevers, chills, or sweats, no generalized fatigue, change in appetite Eyes: No visual changes, double vision, eye pain Ear, nose and throat: No hearing loss, ear pain, nasal congestion, sore throat Cardiovascular: No chest pain, palpitations Respiratory:  No shortness of breath  at rest or with exertion, wheezes GastrointestinaI: No nausea, vomiting, diarrhea, abdominal pain, fecal incontinence Genitourinary:  No dysuria, urinary retention or frequency Musculoskeletal:  No neck pain, back pain Integumentary: No rash, pruritus, skin lesions Neurological: as above Psychiatric: No depression, insomnia, anxiety Endocrine: No palpitations, fatigue, diaphoresis, mood swings, change in appetite, change in weight, increased thirst Hematologic/Lymphatic:  No purpura, petechiae. Allergic/Immunologic: no itchy/runny eyes, nasal congestion, recent allergic reactions, rashes  PHYSICAL EXAM: Vitals:   05/26/17 1523  BP: 124/90  Pulse: (!) 107  SpO2: 98%   General: No acute distress.    IMPRESSION: Multiple sclerosis.  While ischemic cranial nerve palsies are seen in uncontrolled diabetes, given the 3 distinct episodes of various cranial nerve palsies, the appearance of white matter lesions on brain MRI, and CSF analysis, I suspect MS.  He does have a significantly elevated CSF protein.  He does not exhibit any sign of infection.  Diabetes may be a contributor to this.  PLAN: 1.  After discussing various DMT, he would like to start Tecfidera 2.  He historically has low D level (15).  He will start D3 4000 IU daily 3.  Check CBC with diff and D level today and again in 6 months just prior to follow up. 4.  Follow up in 6 months.  25 minutes  spent face to face with patient, 100% spent discussing diagnosis and management.  Metta Clines, DO  CC:  Dr. Raliegh Scarlet

## 2017-05-26 NOTE — Patient Instructions (Addendum)
1.  We will set you up with Tecfidera. 2.  We will check CBC with diff and vitamin D3 level today and again in 6 months (a few days prior to follow up). Your provider has requested that you have labwork completed today. Please go to [facility] ([id number]) on the second floor of this building before leaving the office today. You do not need to check in. If you are not called within 15 minutes please check with the front desk.  3.  Start taking vitamin D3 4000 IU daily 4.  Follow up in 6 months.

## 2017-05-29 LAB — VITAMIN D 1,25 DIHYDROXY
Vitamin D 1, 25 (OH)2 Total: 10 pg/mL — ABNORMAL LOW (ref 18–72)
Vitamin D2 1, 25 (OH)2: <8 pg/mL
Vitamin D3 1, 25 (OH)2: 10 pg/mL

## 2017-05-29 LAB — CBC WITH DIFFERENTIAL/PLATELET
BASOS ABS: 52 {cells}/uL (ref 0–200)
Basophils Relative: 0.8 %
EOS PCT: 3.8 %
Eosinophils Absolute: 247 cells/uL (ref 15–500)
HEMATOCRIT: 37.7 % — AB (ref 38.5–50.0)
HEMOGLOBIN: 12.8 g/dL — AB (ref 13.2–17.1)
LYMPHS ABS: 1417 {cells}/uL (ref 850–3900)
MCH: 29.4 pg (ref 27.0–33.0)
MCHC: 34 g/dL (ref 32.0–36.0)
MCV: 86.5 fL (ref 80.0–100.0)
MPV: 11.7 fL (ref 7.5–12.5)
Monocytes Relative: 9.1 %
NEUTROS ABS: 4193 {cells}/uL (ref 1500–7800)
NEUTROS PCT: 64.5 %
Platelets: 262 10*3/uL (ref 140–400)
RBC: 4.36 10*6/uL (ref 4.20–5.80)
RDW: 12.6 % (ref 11.0–15.0)
Total Lymphocyte: 21.8 %
WBC mixed population: 592 cells/uL (ref 200–950)
WBC: 6.5 10*3/uL (ref 3.8–10.8)

## 2017-05-29 NOTE — Progress Notes (Signed)
Faxing form.

## 2017-05-30 ENCOUNTER — Telehealth: Payer: Self-pay | Admitting: Neurology

## 2017-05-30 NOTE — Telephone Encounter (Signed)
[facility] called regarding this patient's medication and needing Authorization. This was sent through cover my meds. Thanks

## 2017-05-31 ENCOUNTER — Telehealth: Payer: Self-pay

## 2017-05-31 NOTE — Telephone Encounter (Signed)
-----   Message from Drema Dallas, DO sent at 05/29/2017  1:30 PM EDT ----- Vitamin D level is low.  I would like him to start D3 4000 IU daily

## 2017-05-31 NOTE — Telephone Encounter (Signed)
Called Pt, advised of low Vit D, and recommendation to start D3 4000 IU daily

## 2017-06-01 ENCOUNTER — Encounter: Payer: Self-pay | Admitting: Endocrinology

## 2017-06-01 ENCOUNTER — Ambulatory Visit (INDEPENDENT_AMBULATORY_CARE_PROVIDER_SITE_OTHER): Payer: BLUE CROSS/BLUE SHIELD | Admitting: Endocrinology

## 2017-06-01 VITALS — BP 122/78 | HR 107 | Wt 339.4 lb

## 2017-06-01 DIAGNOSIS — E1029 Type 1 diabetes mellitus with other diabetic kidney complication: Secondary | ICD-10-CM

## 2017-06-01 DIAGNOSIS — R809 Proteinuria, unspecified: Secondary | ICD-10-CM

## 2017-06-01 LAB — POCT GLYCOSYLATED HEMOGLOBIN (HGB A1C): Hemoglobin A1C: 6.9 % — AB (ref 4.0–5.6)

## 2017-06-01 NOTE — Patient Instructions (Addendum)
Please take these pump settings:  basal rate of 0.6 units/hr, 24 HRS per day (when not in auto mode) mealtime bolus of 1 unit/12 grams carbohydrate.  correction bolus (which some people call "sensitivity," or "insulin sensitivity ratio," or just "isr") of 1 unit for each 50 by which your glucose exceeds 120.  Please come back for a follow-up appointment in 4 months.

## 2017-06-01 NOTE — Telephone Encounter (Signed)
Tecfidera approved 06/01/17-01/09/2038  Called [employer] start form info, spoke with [place], gave her approval dates and Rx ID# [id number]. She suggested Pt call, they were unable to reach him.  Called and spoke with Pt, gave him contact info, advised him of approval date

## 2017-06-01 NOTE — Progress Notes (Signed)
Subjective:    Patient ID: Michael Hutchinson, male    DOB: October 07, 1975, 42 y.o.   MRN: [id number]  HPI Pt returns for f/u of diabetes mellitus: DM type: 1 Dx'ed: 9833 Complications: left BKA, polyneuropathy, renal insuff, PAD, and right charcot foot.  Therapy: insulin since dx, and pump rx (medtroniuc 670), since 2018.   DKA: once, in 2009 Severe hypoglycemia: once, in 2008.   Pancreatitis: never Pancreatic imaging: never Other: he takes lantus and PRN humalog Interval history:  He takes these settings:  basal rate of 0.6 units/hr, 24 HRS per day.    mealtime bolus of 1 unit/12 grams carbohydrate.  correction bolus (which some people call "sensitivity," or "insulin sensitivity ratio," or just "isr") of 1 unit for each 50 by which your glucose exceeds 120.  Total daily dosage: approx 50 units per day.  Our office has downloaded continuous glucose monitor data, and we reviewed together.  It varies from 60-220, but the vast majority are in the 100's.  There is no trend throughout the day.   Past Medical History:  Diagnosis Date  . Charcot's joint of foot 11/25/2013  . Complication of anesthesia    "I wake up angry" (12/31/2015)  . Diabetes mellitus type 1 ([place]) dx'd 1981  . Diabetic ketoacidosis ([place])   . Essential hypertension 05/03/2013  . GERD (gastroesophageal reflux disease)   . High cholesterol   . Hx MRSA infection    Inner thigh and under arm- healed areas  . Meniscus tear   . Shortness of breath    with exertion only    Past Surgical History:  Procedure Laterality Date  . AMPUTATION Left 01/01/2016   Procedure: AMPUTATION BELOW KNEE;  Surgeon: [name], MD;  Location: [facility];  Service: Orthopedics;  Laterality: Left;  . APPLICATION OF WOUND VAC  04/26/2016  . FRACTURE SURGERY    . HERNIA REPAIR    . I&D EXTREMITY Left 04/26/2016   Procedure: IRRIGATION AND DEBRIDEMENT EXTREMITY/Left Leg/Possible Wound Vac;  Surgeon: [name], MD;  Location: [facility];  Service:  Orthopedics;  Laterality: Left;  . INCISE AND DRAIN ABCESS Left 04/26/2016  . KNEE ARTHROSCOPY Left ~ 2010  . LAPAROSCOPIC CHOLECYSTECTOMY  2015  . METACARPOPHALANGEAL JOINT ARTHRODESIS Left 06/2012   Fracture left index finger intra-articular MCP joint/notes 06/30/2012  . OPEN REDUCTION INTERNAL FIXATION (ORIF) PROXIMAL PHALANX Left 06/30/2012   Procedure: OPEN REDUCTION INTERNAL FIXATION (ORIF) LEFT INDEX FINGER PROXIMAL PHALANX FRACTURE WITH LIGAMENT REPAIR AS NECESSARY;  Surgeon: [name], MD;  Location: [facility];  Service: Orthopedics;  Laterality: Left;  . UMBILICAL HERNIA REPAIR  2015   "w/gallbladder OR"    Social History   Socioeconomic History  . Marital status: Married    Spouse name: Estill Bamberg  . Number of children: 2  . Years of education: Not on file  . Highest education level: High school graduate  Occupational History  . Occupation: Dealer  Social Needs  . Financial resource strain: Not on file  . Food insecurity:    Worry: Not on file    Inability: Not on file  . Transportation needs:    Medical: Not on file    Non-medical: Not on file  Tobacco Use  . Smoking status: Never Smoker  . Smokeless tobacco: Former Systems developer    Types: Snuff, Chew  Substance and Sexual Activity  . Alcohol use: Yes    Alcohol/week: 8.4 oz    Types: 14 Cans of beer per week  . Drug  use: No  . Sexual activity: Yes  Lifestyle  . Physical activity:    Days per week: Not on file    Minutes per session: Not on file  . Stress: Not on file  Relationships  . Social connections:    Talks on phone: Not on file    Gets together: Not on file    Attends religious service: Not on file    Active member of club or organization: Not on file    Attends meetings of clubs or organizations: Not on file    Relationship status: Not on file  . Intimate partner violence:    Fear of current or ex partner: Not on file    Emotionally abused: Not on file    Physically abused: Not on file    Forced  sexual activity: Not on file  Other Topics Concern  . Not on file  Social History Narrative   Pt is a right handed married man, lives with his wife and 2 children ages 31 and 42, in a single story house. Pt drinks 6-8, 16oz diet sodas a day.     Current Outpatient Medications on File Prior to Visit  Medication Sig Dispense Refill  . aspirin 81 MG chewable tablet Chew 1 tablet (81 mg total) by mouth daily. 30 tablet 0  . BAYER MICROLET LANCETS lancets Use as instructed to check 3 times daily. 100 each 12  . Blood Glucose Monitoring Suppl (ONE TOUCH ULTRA SYSTEM KIT) w/Device KIT 1 kit by Does not apply route once. Check blood sugar 3 times daily. Diagnosis Diabetes ICD-10 E11.8    . diazepam (VALIUM) 10 MG tablet Take 1 tablet 30 to 40 minutes prior to MRI.  May take 1 tablet just before MRI if needed. 2 tablet 0  . glucagon (GLUCAGON EMERGENCY) 1 MG injection Inject 1 mg into the muscle once as needed. (Patient taking differently: Inject 1 mg into the muscle daily as needed (hypoglycemia). ) 1 each 11  . glucose blood (BAYER CONTOUR NEXT TEST) test strip 1 each by Other route 4 (four) times daily. And lancets 4/day 400 each 3  . ibuprofen (ADVIL,MOTRIN) 800 MG tablet Take 1 tablet (800 mg total) by mouth 3 (three) times daily. 21 tablet 0  . insulin lispro (HUMALOG) 100 UNIT/ML injection Use 60 units a day via insulin pump. 3 vial 11   No current facility-administered medications on file prior to visit.     Allergies  Allergen Reactions  . Influenza Vac Split Quad Nausea And Vomiting  . Hydrochlorothiazide Other (See Comments)    Dizziness  . Lisinopril Cough  . Losartan Other (See Comments)    Dizziness    Family History  Problem Relation Age of Onset  . Other Mother   . Cancer Mother        Breast / Bone  . Heart attack Father   . Hypertension Father   . Hyperlipidemia Father   . Diabetes Unknown   . Alcohol abuse Sister   . Diabetes Maternal Grandfather   . Stroke Paternal  Grandmother   . Alcohol abuse Paternal Grandfather     BP 122/78   Pulse (!) 107   Wt (!) 339 lb 6.4 oz (154 kg)   SpO2 98%   BMI 46.03 kg/m   Review of Systems Denies LOC    Objective:   Physical Exam GENERAL: no distress Pulses: right foot pulses are intact.   MSK: no deformity of the right foot or ankle  CV: 2+ edema of the right leg. Skin:  no ulcer on the right foot or ankle.  normal temp on the feet and ankles.  There is spotty hyperpigmentation of the distal right foot.   Neuro: sensation is intact to touch on the feet and ankles, but decreased from normal.  Ext: left BKA  Right ankle deformity is noted.   A1c=6.9% Lab Results  Component Value Date   CREATININE 1.19 03/28/2017   BUN 14 03/28/2017   NA 136 03/28/2017   K 4.2 03/28/2017   CL 102 03/28/2017   CO2 26 03/28/2017       Assessment & Plan:  Type 1 DM, with PAD: well-controlled Hypoglycemia: this limits aggressiveness of glycemic control.   Patient Instructions  Please take these pump settings:  basal rate of 0.6 units/hr, 24 HRS per day (when not in auto mode) mealtime bolus of 1 unit/12 grams carbohydrate.  correction bolus (which some people call "sensitivity," or "insulin sensitivity ratio," or just "isr") of 1 unit for each 50 by which your glucose exceeds 120.  Please come back for a follow-up appointment in 4 months.

## 2017-06-19 ENCOUNTER — Telehealth: Payer: Self-pay

## 2017-06-19 MED ORDER — ONDANSETRON HCL 4 MG PO TABS: 4.0000 mg | ORAL_TABLET | Freq: Four times a day (QID) | ORAL | 1 refills | Status: DC | PRN

## 2017-06-19 NOTE — Telephone Encounter (Signed)
Pt called office. He is experiencing nausea and diarrhea since starting the 240 mg Tecfidera almost 2 weeks ago.  I spoke with Dr Everlena Cooper, he suggests OTC anti-diarrheal and Zofran for nausea.  Called and spoke with Pt, advsd him of suggestions and that the side effects normally lessen and resolve as the body gets accustomed to the medication.  [facility], spoke with Judeth Cornfield

## 2017-06-20 ENCOUNTER — Other Ambulatory Visit: Payer: Self-pay

## 2017-06-20 MED ORDER — MONTELUKAST SODIUM 10 MG PO TABS: 10.0000 mg | ORAL_TABLET | Freq: Every day | ORAL | 3 refills | Status: DC

## 2017-06-20 NOTE — Telephone Encounter (Signed)
Dr Everlena Cooper decided Pt could also try Singulair. It has been shown in clinical trials to lessen the GI side effects of Tecfidera.   Called Pt, advised him we will be sending in Rx.

## 2017-07-03 ENCOUNTER — Telehealth: Payer: Self-pay

## 2017-07-03 NOTE — Telephone Encounter (Signed)
Rcvd call from specialty pharmacy filling Tecfidera. They needed new Rx for 240 maintenance dose. Gave verbal.

## 2017-07-11 ENCOUNTER — Ambulatory Visit (INDEPENDENT_AMBULATORY_CARE_PROVIDER_SITE_OTHER): Payer: BLUE CROSS/BLUE SHIELD | Admitting: Family Medicine

## 2017-07-11 ENCOUNTER — Encounter: Payer: Self-pay | Admitting: Family Medicine

## 2017-07-11 VITALS — BP 130/84 | HR 103 | Ht 72.0 in | Wt 337.0 lb

## 2017-07-11 DIAGNOSIS — D649 Anemia, unspecified: Secondary | ICD-10-CM | POA: Insufficient documentation

## 2017-07-11 DIAGNOSIS — R6889 Other general symptoms and signs: Secondary | ICD-10-CM

## 2017-07-11 DIAGNOSIS — E1159 Type 2 diabetes mellitus with other circulatory complications: Secondary | ICD-10-CM

## 2017-07-11 DIAGNOSIS — G35 Multiple sclerosis: Secondary | ICD-10-CM | POA: Insufficient documentation

## 2017-07-11 DIAGNOSIS — G35D Multiple sclerosis, unspecified: Secondary | ICD-10-CM | POA: Insufficient documentation

## 2017-07-11 DIAGNOSIS — E559 Vitamin D deficiency, unspecified: Secondary | ICD-10-CM

## 2017-07-11 DIAGNOSIS — R5382 Chronic fatigue, unspecified: Secondary | ICD-10-CM

## 2017-07-11 DIAGNOSIS — E1069 Type 1 diabetes mellitus with other specified complication: Secondary | ICD-10-CM | POA: Diagnosis not present

## 2017-07-11 DIAGNOSIS — I152 Hypertension secondary to endocrine disorders: Secondary | ICD-10-CM

## 2017-07-11 DIAGNOSIS — E1029 Type 1 diabetes mellitus with other diabetic kidney complication: Secondary | ICD-10-CM

## 2017-07-11 DIAGNOSIS — I10 Essential (primary) hypertension: Secondary | ICD-10-CM

## 2017-07-11 DIAGNOSIS — R809 Proteinuria, unspecified: Secondary | ICD-10-CM

## 2017-07-11 DIAGNOSIS — E786 Lipoprotein deficiency: Secondary | ICD-10-CM

## 2017-07-11 DIAGNOSIS — Z89512 Acquired absence of left leg below knee: Secondary | ICD-10-CM

## 2017-07-11 DIAGNOSIS — E782 Mixed hyperlipidemia: Secondary | ICD-10-CM

## 2017-07-11 MED ORDER — ROSUVASTATIN CALCIUM 10 MG PO TABS: ORAL_TABLET | ORAL | 3 refills | Status: DC

## 2017-07-11 MED ORDER — LOSARTAN POTASSIUM-HCTZ 50-12.5 MG PO TABS: ORAL_TABLET | ORAL | 1 refills | Status: DC

## 2017-07-11 MED ORDER — VITAMIN D (ERGOCALCIFEROL) 1.25 MG (50000 UNIT) PO CAPS: ORAL_CAPSULE | ORAL | 10 refills | Status: DC

## 2017-07-11 NOTE — Progress Notes (Signed)
Impression and Recommendations:    1. Type 1 diabetes mellitus with microalbuminuria (HCC)   2. Multiple sclerosis ([place])   3. Hypertension associated with diabetes ([place])   4. Mixed diabetic hyperlipidemia associated with type 1 diabetes mellitus (HCC)   5. Obesity, Class III, BMI 40-49.9 (morbid obesity) ([place])   6. Hx of BKA, left - with subsequent cellulitis and treatment for osteomylitis   7. Vitamin D deficiency   8. Low HDL (under 40)   9. Chronic fatigue   10. Impaired exercise tolerance   11. Low hemoglobin    1. Diabetes Mellitus - Patient is managed by Dr. Loanne Drilling of Endocrinology.  - Counseled patient on pathophysiology of disease and discussed various treatment options, which often includes dietary and lifestyle modifications as first line.  Importance of low carb/ketogenic diet discussed with patient in addition to regular exercise.   - Being a diabetic, you need yearly eye and foot exams. Make appt.for diabetic eye exam.   - Continue follow up with Dr. Loanne Drilling.  - Advised patient to ask Dr. Loanne Drilling about diabetes medications that can assist with weight loss.  2. New Dx - Multiple Sclerosis - Continue to follow up with Dr. Tomi Likens of Neurology.  - Patient is feeling poorly on his newly prescribed MS medication.  Advised patient to call Dr. Tomi Likens for his recommendations before he decides to discontinue his MS medication.  3. Hypertension - Patient will resume blood pressure medication as prescribed prior. - Patient was taking half tab in the morning and half tab at night. - (Per pt, taking the half tablet BID resolved formerly associated S-E of dizziness).  - Will continue to monitor.  - Lifestyle changes such as dash diet and engaging in a regular exercise program discussed with patient.  Educational handouts provided.  - Ambulatory BP monitoring encouraged. Keep log and bring in next OV  4. Vitamin D Deficiency - Begin taking Vitamin D supplementation as  prescribed, twice weekly. - Patient prefers to take prescription ergocalciferol twice weekly.  - Return for re-check after 2 months to evaluate any changes in Vitamin D.  5. Cholesterol - Start cholesterol 2 days per week at night before bed.  - If side-effects occur, he will let us know.  6. BMI Counseling Explained to patient what BMI refers to, and what it means medically.    Told patient to think about it as a "medical risk stratification measurement" and how increasing BMI is associated with increasing risk/ or worsening state of various diseases such as hypertension, hyperlipidemia, diabetes, premature OA, depression etc.  American Heart Association guidelines for healthy diet, basically Mediterranean diet, and exercise guidelines of 30 minutes 5 days per week or more discussed in detail.  Health counseling performed.  All questions answered.  7. Gastric Bypass Consultation - Patient desires gastric bypass surgery.  He wants to make this choice because "if I ever do lose control of myself, my wife won't be able to move me because I'm so big."  8. Lifestyle & Preventative Health Maintenance - Advised patient to continue working toward exercising to improve health.    - Begin obtaining as much physical activity per day as possible.  Recommended that the patient eventually strive for at least 150 minutes of moderate cardiovascular activity per week according to guidelines established by the [facility].   - Healthy dietary habits encouraged, including low-carb, and high amounts of lean protein in diet.   - Patient should also consume adequate amounts  of water - half of body weight in oz of water per day, around 165 oz of water per day.  - Encouraged patient to obtain adequate sleep with good sleep hygiene.  9. Follow-Up - Will obtain lab work in 6 weeks to evaluate Vitamin D, CMP, kidney function.  - Return in 1-2 weeks after blood work to evaluate progress on new  medications.   Orders Placed This Encounter  Procedures  . CBC with Differential/Platelet  . Hemoglobin A1c  . Comprehensive metabolic panel  . VITAMIN D 25 Hydroxy (Vit-D Deficiency, Fractures)  . TSH  . T4, free  . T3, free  . Vitamin B12  . Folate  . Iron and TIBC  . Ferritin  . Transferrin  . Amb Referral to Bariatric Surgery    Meds ordered this encounter  Medications  . losartan-hydrochlorothiazide (HYZAAR) 50-12.5 MG tablet    Sig: 1/2 tablet twice daily    Dispense:  90 tablet    Refill:  1  . Vitamin D, Ergocalciferol, (DRISDOL) 50000 units CAPS capsule    Sig: 1 tab every Wednesday and Sunday    Dispense:  24 capsule    Refill:  10  . rosuvastatin (CRESTOR) 10 MG tablet    Sig: 1 tablet every evening before bed on Wednesday and Sunday    Dispense:  90 tablet    Refill:  3    Gross side effects, risk and benefits, and alternatives of medications and treatment plan in general discussed with patient.  Patient is aware that all medications have potential side effects and we are unable to predict every side effect or drug-drug interaction that may occur.   Patient will call with any questions prior to using medication if they have concerns.  Expresses verbal understanding and consents to current therapy and treatment regimen.  No barriers to understanding were identified.  Red flag symptoms and signs discussed in detail.  Patient expressed understanding regarding what to do in case of emergency\urgent symptoms  Please see AVS handed out to patient at the end of our visit for further patient instructions/ counseling done pertaining to today's office visit.   Return for Follow-up in 4 weeks for blood work then 6 weeks OV with me.    Note: This note was prepared with assistance of Dragon voice recognition software. Occasional wrong-word or sound-a-like substitutions may have occurred due to the inherent limitations of voice recognition software.   This document serves  as a record of services personally performed by Mellody Dance, DO. It was created on her behalf by Toni Amend, a trained medical scribe. The creation of this record is based on the scribe's personal observations and the provider's statements to them.   I have reviewed the above medical documentation for accuracy and completeness and I concur.  Mellody Dance 07/12/17 12:48 PM   ------------------------------------------------------------------------------------------------------------------------------------------------------------   Subjective:     HPI: Michael Hutchinson is a 41 y.o. male who presents to [place] [facility] today for issues as discussed below.  Work has been very busy.  Notes that he hasn't been drinking adequate amounts of water.  Chronic NSAID Use Patient takes about 12 ibuprofen every day to control pain.  Remarks that he's been doing this for probably years.  Recent MS Diagnosis Patient notes that, at first, his left eye was "flying off into space."  Had two MRI's done.  Eye doctor didn't send him to neurology after the first MRI.  Second MRI showed that  the lesions on his brain had gotten bigger and his eye was screwed up.  Went to a neurologist to see what was going on, and was diagnosed with MS.  Isn't really scared about the diagnosis, but thinking about the future in terms of "if I lose control of myself, I want to make sure Estill Bamberg (his wife) can take care of me."  Vitamin D Deficiency Was told to take OTC Vitamin D by Dr. Tomi Likens.  Has had poor compliance with this.  DM HPI:  Follows up with Dr. Loanne Drilling for his Diabetes.  -  He has not particularly been working on diet and exercise for diabetes Eats very little because he has no appetite and nothing tastes good to him. Notes that he hasn't been exercising because he feels so poorly.  States he "feels like shit" ever since starting the MS medicine.  Pt is currently maintained  on the following medications for diabetes:   see med list today Medication compliance - He's been on the pump since November.   Denies highs or lows on the pump, "every now and then."  "It lets me know if I'm going [place] or [place]."   Denies polyuria/polydipsia.  Denies hypo/ hyperglycemia symptoms  Denies new onset of: chest pain, exercise intolerance, shortness of breath, dizziness, visual changes, headache, lower extremity swelling or claudication.   Last diabetic eye exam was No results found for: HMDIABEYEEXA  Foot exam- UTD  Last A1C in the office was:  Lab Results  Component Value Date   HGBA1C 6.9 (A) 06/01/2017   HGBA1C 9.2 12/26/2016   HGBA1C 10.9 (H) 10/28/2016    Lab Results  Component Value Date   LDLCALC 147 (H) 10/28/2016   CREATININE 1.19 03/28/2017    Wt Readings from Last 3 Encounters:  07/11/17 (!) 337 lb (152.9 kg)  06/01/17 (!) 339 lb 6.4 oz (154 kg)  05/26/17 (!) 340 lb (154.2 kg)    1. HTN HPI:  -  His blood pressure has been controlled at home.  Pt is not checking it at home.  Runs about 130's/80's, notes that "it's never stupid."  - Patient reports on-and-off compliance with blood pressure medications Was formerly taking losartan-HCTZ and he's breaking it in half.  - Denies medication S-E   - Smoking Status noted   - He denies new onset of: chest pain, exercise intolerance, shortness of breath, dizziness, visual changes, headache, lower extremity swelling or claudication.   Last 3 blood pressure readings in our office are as follows: BP Readings from Last 3 Encounters:  07/11/17 130/84  06/01/17 122/78  05/26/17 124/90    Filed Weights   07/11/17 1442  Weight: (!) 337 lb (152.9 kg)    BP Readings from Last 3 Encounters:  07/11/17 130/84  06/01/17 122/78  05/26/17 124/90    Pulse Readings from Last 3 Encounters:  07/11/17 (!) 103  06/01/17 (!) 107  05/26/17 (!) 107   BMI Readings from Last 3 Encounters:  07/11/17 45.71  kg/m  06/01/17 46.03 kg/m  05/26/17 46.11 kg/m     Patient Care Team    Relationship Specialty Notifications Start End  Mellody Dance, DO PCP - General Family Medicine All results, Admissions 12/31/15   Fayette Pho, PA Attending Physician Orthopedic Surgery  06/01/16   [name], MD Consulting Physician Orthopedic Surgery  07/04/16   Campbell Riches, MD Consulting Physician Infectious Diseases  07/04/16   Pieter Partridge, [place] Physician Neurology  05/29/17   Renato Shin,  MD Consulting Physician Endocrinology  05/29/17    Comment: Treatment of diabetes  Philemon Kingdom, MD Consulting Physician Internal Medicine  07/11/17    Comment: Endocrinologist     Patient Active Problem List   Diagnosis Date Noted  . Multiple sclerosis ([place]) 07/11/2017    Priority: High  . Hx of BKA, left - with subsequent cellulitis and treatment for osteomylitis 06/01/2016    Priority: High  . Obesity, Class III, BMI 40-49.9 (morbid obesity) ([place]) 09/22/2015    Priority: High  . Diabetes mellitus type 1 (HCC)     Priority: High  . Hypertension associated with diabetes ([place]) 05/03/2013    Priority: High  . Mixed diabetic hyperlipidemia associated with type 1 diabetes mellitus ([place]) 07/26/2012    Priority: High  . Low HDL (under 40) 09/22/2015    Priority: Medium  . GERD (gastroesophageal reflux disease)     Priority: Medium  . Hx MRSA infection 09/22/2015    Priority: Low  . Low hemoglobin 07/11/2017  . Lateral epicondylitis of right elbow 03/20/2017  . Pain in joint of right elbow 03/20/2017  . Chronic fatigue 07/04/2016  . Impaired exercise tolerance 07/04/2016  . Acute left otitis media 06/09/2016  . Abscess of left leg 04/26/2016  . Diabetes mellitus with complication ([place])   . Normocytic anemia 01/01/2016  . Osteomyelitis ([place]) 12/31/2015  . Diabetic ulcer of left heel associated with type 1 diabetes mellitus, with necrosis of bone ([place]) 12/31/2015  . Ankle wound 12/31/2015   . Chronic osteomyelitis of left ankle with draining sinus ([place])   . Type 1 diabetes, uncontrolled, with Charcot's joint of foot ([place]) 10/12/2015  . H/O noncompliance with medical treatment, presenting hazards to health 09/22/2015  . Vitamin D deficiency 09/22/2015  . Charcot's joint of foot 11/25/2013  . Diabetic ketoacidosis ([place])   . Meniscus tear   . Epigastric pain 12/10/2011  . Vomiting 12/10/2011  . Problems influencing health status 11/21/2011    Past Medical history, Surgical history, Family history, Social history, Allergies and Medications have been entered into the medical record, reviewed and changed as needed.    Current Meds  Medication Sig  . aspirin 81 MG chewable tablet Chew 1 tablet (81 mg total) by mouth daily.  Marland Kitchen BAYER MICROLET LANCETS lancets Use as instructed to check 3 times daily.  . Blood Glucose Monitoring Suppl (ONE TOUCH ULTRA SYSTEM KIT) w/Device KIT 1 kit by Does not apply route once. Check blood sugar 3 times daily. Diagnosis Diabetes ICD-10 E11.8  . diazepam (VALIUM) 10 MG tablet Take 1 tablet 30 to 40 minutes prior to MRI.  May take 1 tablet just before MRI if needed.  Marland Kitchen glucagon (GLUCAGON EMERGENCY) 1 MG injection Inject 1 mg into the muscle once as needed. (Patient taking differently: Inject 1 mg into the muscle daily as needed (hypoglycemia). )  . glucose blood (BAYER CONTOUR NEXT TEST) test strip 1 each by Other route 4 (four) times daily. And lancets 4/day  . ibuprofen (ADVIL,MOTRIN) 800 MG tablet Take 1 tablet (800 mg total) by mouth 3 (three) times daily.  . insulin lispro (HUMALOG) 100 UNIT/ML injection Use 60 units a day via insulin pump.  . montelukast (SINGULAIR) 10 MG tablet Take 1 tablet (10 mg total) by mouth at bedtime.  . ondansetron (ZOFRAN) 4 MG tablet Take 1 tablet (4 mg total) by mouth every 6 (six) hours as needed for nausea or vomiting.    Allergies:  Allergies  Allergen Reactions  . Influenza Vac  Split Quad Nausea And Vomiting   . Hydrochlorothiazide Other (See Comments)    Dizziness  . Lisinopril Cough  . Losartan Other (See Comments)    Dizziness    Review of Systems:  A fourteen system review of systems was performed and found to be positive as per HPI.   Objective:   Blood pressure 130/84, pulse (!) 103, height 6' (1.829 m), weight (!) 337 lb (152.9 kg), SpO2 98 %. Body mass index is 45.71 kg/m. General:  Well Developed, well nourished, appropriate for stated age.  Neuro:  Alert and oriented,  extra-ocular muscles intact  HEENT:  Normocephalic, atraumatic, neck supple, no carotid bruits appreciated  Skin:  no gross rash, warm, pink. Cardiac:  RRR, S1 S2 Respiratory:  ECTA B/L and A/P, Not using accessory muscles, speaking in full sentences- unlabored. Vascular:  Ext warm, no cyanosis apprec.; cap RF less 2 sec. Psych:  No HI/SI, judgement and insight good, Euthymic mood. Full Affect.

## 2017-07-11 NOTE — Patient Instructions (Signed)
Please talk to Dr. Everlena Cooper your neurologist about your side effects from his MS medications.  Please call him to ask for further advice of what you should do next since you have not tolerated it for several months.  Also please call and speak with Dr. Everardo All your diabetes doctor to see if it is possible that a diabetic medication might help you lose weight in addition to your insulin pump.  Not sure if it is allowed with insulin pump or not.   Follow-up in 5 weeks for A1c, CMP, vitamin D

## 2017-07-12 ENCOUNTER — Telehealth: Payer: Self-pay | Admitting: Neurology

## 2017-07-12 DIAGNOSIS — Z79899 Other long term (current) drug therapy: Secondary | ICD-10-CM

## 2017-07-12 DIAGNOSIS — G35 Multiple sclerosis: Secondary | ICD-10-CM

## 2017-07-14 NOTE — Telephone Encounter (Signed)
Called and spoke with Pt. The Tecfidera is still causing the GI side effects. He's been on it for 6 wks, has used the Zofran and tried the [place], which has helped some, but side effects are still very bothersome.

## 2017-07-14 NOTE — Telephone Encounter (Signed)
We can try Aubagio.  We have to check TB, CBC with diff, LFTs.  It may increase liver enzymes, so we would have to check LFTs monthly for 6 months, and then every 6 months.  We will recheck CBC with diff every 6 months.  We should provide him information about the medication.

## 2017-07-14 NOTE — Telephone Encounter (Signed)
Called and spoke with Pt. He will come Monday for labs and sign form for Aubagio.

## 2017-07-17 ENCOUNTER — Ambulatory Visit (INDEPENDENT_AMBULATORY_CARE_PROVIDER_SITE_OTHER): Payer: BLUE CROSS/BLUE SHIELD | Admitting: Endocrinology

## 2017-07-17 ENCOUNTER — Telehealth: Payer: Self-pay | Admitting: Endocrinology

## 2017-07-17 ENCOUNTER — Telehealth: Payer: Self-pay | Admitting: Emergency Medicine

## 2017-07-17 ENCOUNTER — Encounter: Payer: Self-pay | Admitting: Endocrinology

## 2017-07-17 VITALS — BP 148/92 | HR 125 | Wt 333.6 lb

## 2017-07-17 DIAGNOSIS — E1065 Type 1 diabetes mellitus with hyperglycemia: Secondary | ICD-10-CM | POA: Diagnosis not present

## 2017-07-17 DIAGNOSIS — IMO0002 Reserved for concepts with insufficient information to code with codable children: Secondary | ICD-10-CM

## 2017-07-17 DIAGNOSIS — E10610 Type 1 diabetes mellitus with diabetic neuropathic arthropathy: Secondary | ICD-10-CM | POA: Diagnosis not present

## 2017-07-17 LAB — POCT URINALYSIS DIPSTICK
Bilirubin, UA: NEGATIVE
Glucose, UA: POSITIVE — AB
Leukocytes, UA: NEGATIVE
NITRITE UA: NEGATIVE
Protein, UA: NEGATIVE
Spec Grav, UA: 1.01 (ref 1.010–1.025)
Urobilinogen, UA: NEGATIVE E.U./dL — AB
pH, UA: 5 (ref 5.0–8.0)

## 2017-07-17 NOTE — Telephone Encounter (Signed)
Patient actually came into office today & Dr. Lucianne Muss ws able to see patient today for an acute visit.

## 2017-07-17 NOTE — Telephone Encounter (Signed)
Pts wife called stating patients blood sugar will not go below 506. Its been elevated all weekend regardless of what he eats or does. They are wondering what they need to do next? Please advise thanks.

## 2017-07-17 NOTE — Progress Notes (Signed)
patient ID: Michael Hutchinson, male   DOB: 11-09-1975, 42 y.o.   MRN: [id number]   Chief complaint : Very high blood sugar  History of Present Illness:          Date of diagnosis: 1981     Prior history:    He was started on an insulin pump with [employer] 670 in 2018  INSULIN regimen : Basal rate 0.6 using 670 pump  Recent history:     Patient came in because his blood sugar went up over 500 this morning and he was not able to bring it down with repeated boluses He says that he also had problems with high sugars last Thursday but after changing his infusion set in the evening his blood sugars came down overnight for a short time and were high again around 200 frequently on Friday  Blood sugars were fairly good on Saturday but started going up in the evening yesterday and this morning was 389 at breakfast time when he change his infusion set  About 1-1/2 hours prior to his coming here he gave himself 10 units of Humalog with a syringe and his blood sugar has now come down in the office to as low as 386 He does not complain of any nausea, only heartburn and a little jitteriness  Patient has been in the auto mode 94% of the time in the last 2 weeks His sensor has been active 93% of the time and average sensor blood sugar is 177 ACTIVE insulin time is 4 hours  Glucose patterns:  Fasting glucose: Mildly increased and relatively higher the last few days  Postprandial glucoses are quite variable but appear to be relatively higher after supper, difficult to assess since he had significant hypoglycemia in the evenings 3 times this last 2 weeks interval  BOLUSES are usually done at least 10 minutes before eating usually but he thinks his blood sugars tend to go up quickly with food and is asking about Fiasp  His blood sugars 2 to 3 hours after bolus from his pump download on not unusually high or low  Hypoglycemia: None, only one low sugar of 66     Diabetes labs:  Lab Results    Component Value Date   HGBA1C 6.9 (A) 06/01/2017   HGBA1C 9.2 12/26/2016   HGBA1C 10.9 (H) 10/28/2016   Lab Results  Component Value Date   LDLCALC 147 (H) 10/28/2016   CREATININE 1.19 03/28/2017    Office Visit on 07/17/2017  Component Date Value Ref Range Status  . Color, UA 07/17/2017 light yellow   Final  . Clarity, UA 07/17/2017 clear   Final  . Glucose, UA 07/17/2017 Positive* Negative Final  . Bilirubin, UA 07/17/2017 negative   Final  . Ketones, UA 07/17/2017 small   Final  . Spec Grav, UA 07/17/2017 1.010  1.010 - 1.025 Final  . Blood, UA 07/17/2017 trace   Final  . pH, UA 07/17/2017 5.0  5.0 - 8.0 Final  . Protein, UA 07/17/2017 Negative  Negative Final  . Urobilinogen, UA 07/17/2017 negative* 0.2 or 1.0 E.U./dL Final  . Nitrite, UA 07/17/2017 negative   Final  . Leukocytes, UA 07/17/2017 Negative  Negative Final  . Appearance 07/17/2017 light yellow   Final  . Odor 07/17/2017 none   Final    Allergies as of 07/17/2017      Reactions   Influenza Vac Split Quad Nausea And Vomiting   Hydrochlorothiazide Other (See Comments)   Dizziness   Lisinopril  Cough   Losartan Other (See Comments)   Dizziness      Medication List        Accurate as of 07/17/17  3:04 PM. Always use your most recent med list.          aspirin 81 MG chewable tablet Chew 1 tablet (81 mg total) by mouth daily.   BAYER MICROLET LANCETS lancets Use as instructed to check 3 times daily.   diazepam 10 MG tablet Commonly known as:  VALIUM Take 1 tablet 30 to 40 minutes prior to MRI.  May take 1 tablet just before MRI if needed.   glucagon 1 MG injection Commonly known as:  GLUCAGON EMERGENCY Inject 1 mg into the muscle once as needed.   glucose blood test strip Commonly known as:  BAYER CONTOUR NEXT TEST 1 each by Other route 4 (four) times daily. And lancets 4/day   ibuprofen 800 MG tablet Commonly known as:  ADVIL,MOTRIN Take 1 tablet (800 mg total) by mouth 3 (three) times  daily.   insulin lispro 100 UNIT/ML injection Commonly known as:  HUMALOG Use 60 units a day via insulin pump.   losartan-hydrochlorothiazide 50-12.5 MG tablet Commonly known as:  HYZAAR 1/2 tablet twice daily   montelukast 10 MG tablet Commonly known as:  SINGULAIR Take 1 tablet (10 mg total) by mouth at bedtime.   ondansetron 4 MG tablet Commonly known as:  ZOFRAN Take 1 tablet (4 mg total) by mouth every 6 (six) hours as needed for nausea or vomiting.   ONE TOUCH ULTRA SYSTEM KIT w/Device Kit 1 kit by Does not apply route once. Check blood sugar 3 times daily. Diagnosis Diabetes ICD-10 E11.8   rosuvastatin 10 MG tablet Commonly known as:  CRESTOR 1 tablet every evening before bed on Wednesday and Sunday   Vitamin D (Ergocalciferol) 50000 units Caps capsule Commonly known as:  DRISDOL 1 tab every Wednesday and Sunday       Allergies:  Allergies  Allergen Reactions  . Influenza Vac Split Quad Nausea And Vomiting  . Hydrochlorothiazide Other (See Comments)    Dizziness  . Lisinopril Cough  . Losartan Other (See Comments)    Dizziness    Past Medical History:  Diagnosis Date  . Charcot's joint of foot 11/25/2013  . Complication of anesthesia    "I wake up angry" (12/31/2015)  . Diabetes mellitus type 1 ([place]) dx'd 1981  . Diabetic ketoacidosis ([place])   . Essential hypertension 05/03/2013  . GERD (gastroesophageal reflux disease)   . High cholesterol   . Hx MRSA infection    Inner thigh and under arm- healed areas  . Meniscus tear   . Shortness of breath    with exertion only    Past Surgical History:  Procedure Laterality Date  . AMPUTATION Left 01/01/2016   Procedure: AMPUTATION BELOW KNEE;  Surgeon: [name], MD;  Location: [facility];  Service: Orthopedics;  Laterality: Left;  . APPLICATION OF WOUND VAC  04/26/2016  . FRACTURE SURGERY    . HERNIA REPAIR    . I&D EXTREMITY Left 04/26/2016   Procedure: IRRIGATION AND DEBRIDEMENT EXTREMITY/Left  Leg/Possible Wound Vac;  Surgeon: [name], MD;  Location: [facility];  Service: Orthopedics;  Laterality: Left;  . INCISE AND DRAIN ABCESS Left 04/26/2016  . KNEE ARTHROSCOPY Left ~ 2010  . LAPAROSCOPIC CHOLECYSTECTOMY  2015  . METACARPOPHALANGEAL JOINT ARTHRODESIS Left 06/2012   Fracture left index finger intra-articular MCP joint/notes 06/30/2012  . OPEN REDUCTION INTERNAL FIXATION (ORIF)  PROXIMAL PHALANX Left 06/30/2012   Procedure: OPEN REDUCTION INTERNAL FIXATION (ORIF) LEFT INDEX FINGER PROXIMAL PHALANX FRACTURE WITH LIGAMENT REPAIR AS NECESSARY;  Surgeon: [name], MD;  Location: [facility];  Service: Orthopedics;  Laterality: Left;  . UMBILICAL HERNIA REPAIR  2015   "w/gallbladder OR"    Family History  Problem Relation Age of Onset  . Other Mother   . Cancer Mother        Breast / Bone  . Heart attack Father   . Hypertension Father   . Hyperlipidemia Father   . Diabetes Unknown   . Alcohol abuse Sister   . Diabetes Maternal Grandfather   . Stroke Paternal Grandmother   . Alcohol abuse Paternal Grandfather     Social History:  reports that he has never smoked. He quit smokeless tobacco use about 6 years ago. His smokeless tobacco use included snuff and chew. He reports that he drinks about 8.4 oz of alcohol per week. He reports that he does not use drugs.    Review of Systems:   Complaining of heartburn today  Physical Examination:  BP (!) 148/92   Pulse (!) 125   Wt (!) 333 lb 9.6 oz (151.3 kg)   SpO2 97%   BMI 45.24 kg/m   His infusion sites look normal to inspection      ASSESSMENT/PLAN   Diabetes type 1 with usually fairly good control   Problems identified and recommendations:  He had significant hyperglycemia likely related to infusion set issue today and also on Thursday  Today after giving an injection with a syringe his blood sugar is now coming down and he does not have any evidence of ketosis, has only small ketones on his urine and is not having  any symptoms now  He needs to discuss his infusion set issues with nurse educator  Consider Fiasp for faster onset of action as patient is requesting this  Would consider shortening the active insulin time to 3-1/2 hours since blood sugars tend to be relatively higher about 3 hours later  If postprandial readings are consistently high after supper may change carbohydrate ratio also                      total visit time for reviewing patient history and records, evaluation and management of diabetes, review of insulin pump download and counseling =25 minutes       Elayne Snare 07/17/2017, 3:04 PM

## 2017-07-17 NOTE — Patient Instructions (Signed)
Call CDE Bonita Quin in am

## 2017-07-17 NOTE — Telephone Encounter (Signed)
Questions: Any reason you know of, such as recent steroids? Any pump problems? Any n/v/sob? Please take your ISR every hour if necessary, to get blood sugar better. Please call or message [place] tomorrow, to tell us how the blood sugar is doing

## 2017-07-17 NOTE — Telephone Encounter (Signed)
Marchelle Folks (wife) ph# [phone]. Patient's blood sugar is 549 (after taking insulin). Please call Marchelle Folks asap at the above number

## 2017-07-21 DIAGNOSIS — Z794 Long term (current) use of insulin: Secondary | ICD-10-CM | POA: Diagnosis not present

## 2017-07-21 DIAGNOSIS — E109 Type 1 diabetes mellitus without complications: Secondary | ICD-10-CM | POA: Diagnosis not present

## 2017-08-08 ENCOUNTER — Telehealth: Payer: Self-pay | Admitting: Endocrinology

## 2017-08-08 ENCOUNTER — Encounter: Payer: Self-pay | Admitting: Neurology

## 2017-08-08 ENCOUNTER — Encounter: Payer: Self-pay | Admitting: Family Medicine

## 2017-08-08 ENCOUNTER — Ambulatory Visit (INDEPENDENT_AMBULATORY_CARE_PROVIDER_SITE_OTHER): Payer: BLUE CROSS/BLUE SHIELD | Admitting: Family Medicine

## 2017-08-08 VITALS — BP 128/84 | HR 90 | Ht 72.0 in | Wt 332.0 lb

## 2017-08-08 DIAGNOSIS — R809 Proteinuria, unspecified: Secondary | ICD-10-CM

## 2017-08-08 DIAGNOSIS — E1069 Type 1 diabetes mellitus with other specified complication: Secondary | ICD-10-CM

## 2017-08-08 DIAGNOSIS — E782 Mixed hyperlipidemia: Secondary | ICD-10-CM

## 2017-08-08 DIAGNOSIS — R5383 Other fatigue: Secondary | ICD-10-CM | POA: Diagnosis not present

## 2017-08-08 DIAGNOSIS — E559 Vitamin D deficiency, unspecified: Secondary | ICD-10-CM

## 2017-08-08 DIAGNOSIS — E1029 Type 1 diabetes mellitus with other diabetic kidney complication: Secondary | ICD-10-CM

## 2017-08-08 DIAGNOSIS — G35 Multiple sclerosis: Secondary | ICD-10-CM | POA: Diagnosis not present

## 2017-08-08 DIAGNOSIS — D649 Anemia, unspecified: Secondary | ICD-10-CM

## 2017-08-08 DIAGNOSIS — Z789 Other specified health status: Secondary | ICD-10-CM | POA: Insufficient documentation

## 2017-08-08 DIAGNOSIS — Z89512 Acquired absence of left leg below knee: Secondary | ICD-10-CM

## 2017-08-08 DIAGNOSIS — E1159 Type 2 diabetes mellitus with other circulatory complications: Secondary | ICD-10-CM

## 2017-08-08 DIAGNOSIS — I152 Hypertension secondary to endocrine disorders: Secondary | ICD-10-CM

## 2017-08-08 DIAGNOSIS — R6889 Other general symptoms and signs: Secondary | ICD-10-CM

## 2017-08-08 DIAGNOSIS — E786 Lipoprotein deficiency: Secondary | ICD-10-CM

## 2017-08-08 DIAGNOSIS — I10 Essential (primary) hypertension: Secondary | ICD-10-CM

## 2017-08-08 DIAGNOSIS — R5382 Chronic fatigue, unspecified: Secondary | ICD-10-CM

## 2017-08-08 MED ORDER — EZETIMIBE 10 MG PO TABS: 10.0000 mg | ORAL_TABLET | Freq: Every day | ORAL | 1 refills | Status: DC

## 2017-08-08 NOTE — Patient Instructions (Addendum)
Please install the [employer]It App on your phone! This application can help you track your nutritional intake.  - Begin tracking all of your food intake/calories per day.   -We will get in touch with you if any crucial findings on labs otherwise we will discuss at follow-up OV      Behavior Modification Ideas for Weight Management  Weight management involves adopting a healthy lifestyle that includes a knowledge of nutrition and exercise, a positive attitude and the right kind of motivation. Internal motives such as better health, increased energy, self-esteem and personal control increase your chances of lifelong weight management success.  Remember to have realistic goals and think long-term success. Believe in yourself and you can do it. The following information will give you ideas to help you meet your goals.  Control Your Home Environment  Eat only while sitting down at the kitchen or dining room table. Do not eat while watching television, reading, cooking, talking on the phone, standing at the refrigerator or working on the computer. Keep tempting foods out of the house -- don't buy them. Keep tempting foods out of sight. Have low-calorie foods ready to eat. Unless you are preparing a meal, stay out of the kitchen. Have healthy snacks at your disposal, such as small pieces of fruit, vegetables, canned fruit, pretzels, low-fat string cheese and nonfat cottage cheese.  Control Your Work Environment  Do not eat at [employer] or keep tempting snacks at your desk. If you get hungry between meals, plan healthy snacks and bring them with you to work. During your breaks, go for a walk instead of eating. If you work around food, plan in advance the one item you will eat at mealtime. Make it inconvenient to nibble on food by chewing gum, sugarless candy or drinking water or another low-calorie beverage. Do not work through meals. Skipping meals slows down metabolism and may result in  overeating at the next meal. If food is available for special occasions, either pick the healthiest item, nibble on low-fat snacks brought from home, don't have anything offered, choose one option and have a small amount, or have only a beverage.  Control Your Mealtime Environment  Serve your plate of food at the stove or kitchen counter. Do not put the serving dishes on the table. If you do put dishes on the table, remove them immediately when finished eating. Fill half of your plate with vegetables, a quarter with lean protein and a quarter with starch. Use smaller plates, bowls and glasses. A smaller portion will look large when it is in a little dish. Politely refuse second helpings. When fixing your plate, limit portions of food to one scoop/serving or less.   Daily Food Management  Replace eating with another activity that you will not associate with food. Wait 20 minutes before eating something you are craving. Drink a large glass of water or diet soda before eating. Always have a big glass or bottle of water to drink throughout the day. Avoid high-calorie add-ons such as cream with your coffee, butter, mayonnaise and salad dressings.  Shopping: Do not shop when hungry or tired. Shop from a list and avoid buying anything that is not on your list. If you must have tempting foods, buy individual-sized packages and try to find a lower-calorie alternative. Don't taste test in the store. Read food labels. Compare products to help you make the healthiest choices.  Preparation: Chew a piece of gum while cooking meals. Use a quarter teaspoon if  you taste test your food. Try to only fix what you are going to eat, leaving yourself no chance for seconds. If you have prepared more food than you need, portion it into individual containers and freeze or refrigerate immediately. Don't snack while cooking meals.  Eating: Eat slowly. Remember it takes about 20 minutes for your stomach to  send a message to your brain that it is full. Don't let fake hunger make you think you need more. The ideal way to eat is to take a bite, put your utensil down, take a sip of water, cut your next bite, take a bit, put your utensil down and so on. Do not cut your food all at one time. Cut only as needed. Take small bites and chew your food well. Stop eating for a minute or two at least once during a meal or snack. Take breaks to reflect and have conversation.  Cleanup and Leftovers: Label leftovers for a specific meal or snack. Freeze or refrigerate individual portions of leftovers. Do not clean up if you are still hungry.  Eating Out and Social Eating  Do not arrive hungry. Eat something light before the meal. Try to fill up on low-calorie foods, such as vegetables and fruit, and eat smaller portions of the high-calorie foods. Eat foods that you like, but choose small portions. If you want seconds, wait at least 20 minutes after you have eaten to see if you are actually hungry or if your eyes are bigger than your stomach. Limit alcoholic beverages. Try a soda water with a twist of lime. Do not skip other meals in the day to save room for the special event.  At Restaurants: Order  la carte rather than buffet style. Order some vegetables or a salad for an appetizer instead of eating bread. If you order a high-calorie dish, share it with someone. Try an after-dinner mint with your coffee. If you do have dessert, share it with two or more people. Don't overeat because you do not want to waste food. Ask for a doggie bag to take extra food home. Tell the server to put half of your entree in a to go bag before the meal is served to you. Ask for salad dressing, gravy or high-fat sauces on the side. Dip the tip of your fork in the dressing before each bite. If bread is served, ask for only one piece. Try it plain without butter or oil. At [employer] where oil and vinegar is served with  bread, use only a small amount of oil and a lot of vinegar for dipping.  At a Friend's House: Offer to bring a dish, appetizer or dessert that is low in calories. Serve yourself small portions or tell the host that you only want a small amount. Stand or sit away from the snack table. Stay away from the kitchen or stay busy if you are near the food. Limit your alcohol intake.  At [employer] and Cafeterias: Cover most of your plate with lettuce and/or vegetables. Use a salad plate instead of a dinner plate. After eating, clear away your dishes before having coffee or tea.  Entertaining at Home: Explore low-fat, low-cholesterol cookbooks. Use single-serving foods like chicken breasts or hamburger patties. Prepare low-calorie appetizers and desserts.   Holidays: Keep tempting foods out of sight. Decorate the house without using food. Have low-calorie beverages and foods on hand for guests. Allow yourself one planned treat a day. Don't skip meals to save up for the holiday  feast. Eat regular, planned meals.   Exercise Well  Make exercise a priority and a planned activity in the day. If possible, walk the entire or part of the distance to work. Get an exercise buddy. Go for a walk with a colleague during one of your breaks, go to the gym, run or take a walk with a friend, walk in the mall with a shopping companion. Park at the end of the parking lot and walk to the store or office entrance. Always take the stairs all of the way or at least part of the way to your floor. If you have a desk job, walk around the office frequently. Do leg lifts while sitting at your desk. Do something outside on the weekends like going for a hike or a bike ride.   Have a Healthy Attitude  Make health your weight management priority. Be realistic. Have a goal to achieve a healthier you, not necessarily the lowest weight or ideal weight based on calculations or tables. Focus on a healthy eating style, not  on dieting. Dieting usually lasts for a short amount of time and rarely produces long-term success. Think long term. You are developing new healthy behaviors to follow next month, in a year and in a decade.    This information is for educational purposes only and is not intended to replace the advice of your doctor or health care provider. We encourage you to discuss with your doctor any questions or concerns you may have.        Guidelines for Losing Weight   We want weight loss that will last so you should lose 1-2 pounds a week.  THAT IS IT! Please pick THREE things a month to change. Once it is a habit check off the item. Then pick another three items off the list to become habits.  If you are already doing a habit on the list GREAT!  Cross that item off!  Dont drink your calories. Ie, alcohol, soda, fruit juice, and sweet tea.   Drink more water. Drink a glass when you feel hungry or before each meal.   Eat breakfast - Complex carb and protein (likeDannon light and fit yogurt, oatmeal, fruit, eggs, [place] bacon).  Measure your cereal.  Eat no more than one cup a day. (ie Kashi)  Eat an apple a day.  Add a vegetable a day.  Try a new vegetable a month.  Use Pam! Stop using oil or butter to cook.  Dont finish your plate or use smaller plates.  Share your dessert.  Eat sugar free Jello for dessert or frozen grapes.  Dont eat 2-3 hours before bed.  Switch to whole wheat bread, pasta, and brown rice.  Make healthier choices when you eat out. No fries!  Pick baked chicken, NOT fried.  Dont forget to SLOW DOWN when you eat. It is not going anywhere.   Take the stairs.  Park far away in the parking lot  Lift soup cans (or weights) for 10 minutes while watching TV.  Walk at work for 10 minutes during break.  Walk outside 1 time a week with your friend, kids, dog, or significant other.  Start a walking group at church.  Walk the mall as much as you can  tolerate.   Keep a food diary.  Weigh yourself daily.  Walk for 15 minutes 3 days per week.  Cook at home more often and eat out less. If life happens and you go back to  old habits, it is okay.  Just start over. You can do it!  If you experience chest pain, get short of breath, or tired during the exercise, please stop immediately and inform your doctor.    Before you even begin to attack a weight-loss plan, it pays to remember this: You are not fat. You have fat. Losing weight isn't about blame or shame; it's simply another achievement to accomplish. Dieting is like any other skill--you have to buckle down and work at it. As long as you act in a smart, reasonable way, you'll ultimately get where you want to be. Here are some weight loss pearls for you.   1. It's Not a Diet. It's a Lifestyle Thinking of a diet as something you're on and suffering through only for the short term doesn't work. To shed weight and keep it off, you need to make permanent changes to the way you eat. It's OK to indulge occasionally, of course, but if you cut calories temporarily and then revert to your old way of eating, you'll gain back the weight quicker than you can say yo-yo. Use it to lose it. Research shows that one of the best predictors of long-term weight loss is how many pounds you drop in the first month. For that reason, nutritionists often suggest being stricter for the first two weeks of your new eating strategy to build momentum. Cut out added sugar and alcohol and avoid unrefined carbs. After that, figure out how you can reincorporate them in a way that's healthy and maintainable.  2. There's a Right Way to Exercise Working out burns calories and fat and boosts your metabolism by building muscle. But those trying to lose weight are notorious for overestimating the number of calories they burn and underestimating the amount they take in. Unfortunately, your system is biologically programmed to hold on to  extra pounds and that means when you start exercising, your body senses the deficit and ramps up its hunger signals. If you're not diligent, you'll eat everything you burn and then some. Use it, to lose it. Cardio gets all the exercise glory, but strength and interval training are the real heroes. They help you build lean muscle, which in turn increases your metabolism and calorie-burning ability 3. Don't Overreact to Mild Hunger Some people have a hard time losing weight because of hunger anxiety. To them, being hungry is bad--something to be avoided at all costs--so they carry snacks with them and eat when they don't need to. Others eat because they're stressed out or bored. While you never want to get to the point of being ravenous (that's when bingeing is likely to happen), a hunger pang, a craving, or the fact that it's 3:00 p.m. should not send you racing for the vending machine or obsessing about the energy bar in your purse. Ideally, you should put off eating until your stomach is growling and it's difficult to concentrate.  Use it to lose it. When you feel the urge to eat, use the HALT method. Ask yourself, Am I really hungry? Or am I angry or anxious, lonely or bored, or tired? If you're still not certain, try the apple test. If you're truly hungry, an apple should seem delicious; if it doesn't, something else is going on. Or you can try drinking water and making yourself busy, if you are still hungry try a healthy snack.  4. Not All Calories Are Created Equal The mechanics of weight loss are pretty simple: Take in fewer  calories than you use for energy. But the kind of food you eat makes all the difference. Processed food that's high in saturated fat and refined starch or sugar can cause inflammation that disrupts the hormone signals that tell your brain you're full. The result: You eat a lot more.  Use it to lose it. Clean up your diet. Swap in whole, unprocessed foods, including vegetables, lean  protein, and healthy fats that will fill you up and give you the biggest nutritional bang for your calorie buck. In a few weeks, as your brain starts receiving regular hunger and fullness signals once again, you'll notice that you feel less hungry overall and naturally start cutting back on the amount you eat.  5. Protein, Produce, and Plant-Based Fats Are Your Weight-Loss Trinity Here's why eating the three Ps regularly will help you drop pounds. Protein fills you up. You need it to build lean muscle, which keeps your metabolism humming so that you can torch more fat. People in a weight-loss program who ate double the recommended daily allowance for protein (about 110 grams for a 150-pound woman) lost 70 percent of their weight from fat, while people who ate the RDA lost only about 40 percent, one study found. Produce is packed with filling fiber. "It's very difficult to consume too many calories if you're eating a lot of vegetables. Example: Three cups of broccoli is a lot of food, yet only 93 calories. (Fruit is another story. It can be easy to overeat and can contain a lot of calories from sugar, so be sure to monitor your intake.) Plant-based fats like olive oil and those in avocados and nuts are healthy and extra satiating.  Use it to lose it. Aim to incorporate each of the three Ps into every meal and snack. People who eat protein throughout the day are able to keep weight off, according to a study in the American Journal of Clinical Nutrition. In addition to meat, poultry and seafood, good sources are beans, lentils, eggs, tofu, and yogurt. As for fat, keep portion sizes in check by measuring out salad dressing, oil, and nut butters (shoot for one to two tablespoons). Finally, eat veggies or a little fruit at every meal. People who did that consumed 308 fewer calories but didn't feel any hungrier than when they didn't eat more produce.  7. How You Eat Is As Important As What You Eat In order for  your brain to register that you're full, you need to focus on what you're eating. Sit down whenever you eat, preferably at a table. Turn off the TV or computer, put down your phone, and look at your food. Smell it. Chew slowly, and don't put another bite on your fork until you swallow. When women ate lunch this attentively, they consumed 30 percent less when snacking later than those who listened to an audiobook at lunchtime, according to a study in the [place] Journal of Nutrition. 8. Weighing Yourself Really Works The scale provides the best evidence about whether your efforts are paying off. Seeing the numbers tick up or down or stagnate is motivation to keep going--or to rethink your approach. A 2015 study at [facility] found that daily weigh-ins helped people lose more weight, keep it off, and maintain that loss, even after two years. Use it to lose it. Step on the scale at the same time every day for the best results. If your weight shoots up several pounds from one weigh-in to the next, don't freak  out. Eating a lot of salt the night before or having your period is the likely culprit. The number should return to normal in a day or two. It's a steady climb that you need to do something about. 9. Too Much Stress and Too Little Sleep Are Your Enemies When you're tired and frazzled, your body cranks up the production of cortisol, the stress hormone that can cause carb cravings. Not getting enough sleep also boosts your levels of ghrelin, a hormone associated with hunger, while suppressing leptin, a hormone that signals fullness and satiety. People on a diet who slept only five and a half hours a night for two weeks lost 55 percent less fat and were hungrier than those who slept eight and a half hours, according to a study in the [place] [employer] Journal. Use it to lose it. Prioritize sleep, aiming for seven hours or more a night, which research shows helps lower stress. And make sure  you're getting quality zzz's. If a snoring spouse or a fidgety cat wakes you up frequently throughout the night, you may end up getting the equivalent of just four hours of sleep, according to a study from [facility]. Keep pets out of the bedroom, and use a white-noise app to drown out snoring. 10. You Will Hit a plateau--And You Can Bust Through It As you slim down, your body releases much less leptin, the fullness hormone.  If you're not strength training, start right now. Building muscle can raise your metabolism to help you overcome a plateau. To keep your body challenged and burning calories, incorporate new moves and more intense intervals into your workouts or add another sweat session to your weekly routine. Alternatively, cut an extra 100 calories or so a day from your diet. Now that you've lost weight, your body simply doesn't need as much fuel.    Since food equals calories, in order to lose weight you must either eat fewer calories, exercise more to burn off calories with activity, or both. Food that is not used to fuel the body is stored as fat. A major component of losing weight is to make smarter food choices. Here's how:  1)   Limit non-nutritious foods, such as: Sugar, honey, syrups and candy Pastries, donuts, pies, cakes and cookies Soft drinks, sweetened juices and alcoholic beverages  2)  Cut down on high-fat foods by: - Choosing poultry, fish or lean red meat - Choosing low-fat cooking methods, such as baking, broiling, steaming, grilling and boiling - Using low-fat or non-fat dairy products - Using vinaigrette, herbs, lemon or fat-free salad dressings - Avoiding fatty meats, such as bacon, sausage, franks, ribs and luncheon meats - Avoiding high-fat snacks like nuts, chips and chocolate - Avoiding fried foods - Using less butter, margarine, oil and mayonnaise - Avoiding high-fat gravies, cream sauces and cream-based soups  3) Eat a variety of foods,  including: - Fruit and vegetables that are raw, steamed or baked - Whole grains, breads, cereal, rice and pasta - Dairy products, such as low-fat or non-fat milk or yogurt, low-fat cottage cheese and low-fat cheese - Protein-rich foods like chicken, [place], fish, lean meat and legumes, or beans  4) Change your eating habits by: - Eat three balanced meals a day to help control your hunger - Watch portion sizes and eat small servings of a variety of foods - Choose low-calorie snacks - Eat only when you are hungry and stop when you are satisfied - Eat slowly and try  not to perform other tasks while eating - Find other activities to distract you from food, such as walking, taking up a hobby or being involved in the community - Include regular exercise in your daily routine ( minimum of 20 min of moderate-intensity exercise at least 5 days/week)  - Find a support group, if necessary, for emotional support in your weight loss journey           Easy ways to cut 100 calories   1. Eat your eggs with hot sauce OR salsa instead of cheese.  Eggs are great for breakfast, but many people consider eggs and cheese to be BFFs. Instead of cheese--1 oz. of cheddar has 114 calories--top your eggs with hot sauce, which contains no calories and helps with satiety and metabolism. Salsa is also a great option!!  2. Top your toast, waffles or pancakes with fresh berries instead of jelly or syrup. Half a cup of berries--fresh, frozen or thawed--has about 40 calories, compared with 2 tbsp. of maple syrup or jelly, which both have about 100 calories. The berries will also give you a good punch of fiber, which helps keep you full and satisfied and wont spike blood sugar quickly like the jelly or syrup. 3. Swap the non-fat latte for black coffee with a splash of half-and-half. Contrary to its name, that non-fat latte has 130 calories and a startling 19g of carbohydrates per 16 oz. serving. Replacing that light  drinkable dessert with a black coffee with a splash of half-and-half saves you more than 100 calories per 16 oz. serving. 4. Sprinkle salads with freeze-dried raspberries instead of dried cranberries. If you want a sweet addition to your nutritious salad, stay away from dried cranberries. They have a whopping 130 calories per  cup and 30g carbohydrates. Instead, sprinkle freeze-dried raspberries guilt-free and save more than 100 calories per  cup serving, adding 3g of belly-filling fiber. 5. Go for mustard in place of mayo on your sandwich. Mustard can add really nice flavor to any sandwich, and there are tons of varieties, from spicy to honey. A serving of mayo is 95 calories, versus 10 calories in a serving of mustard.  Or try an avocado mayo spread: You can find the recipe few click this link: https://www.californiaavocado.com/recipes/recipe-container/california-avocado-mayo 6. Choose a DIY salad dressing instead of the store-bought kind. Mix Dijon or whole grain mustard with low-fat Kefir or red wine vinegar and garlic. 7. Use hummus as a spread instead of a dip. Use hummus as a spread on a high-fiber cracker or tortilla with a sandwich and save on calories without sacrificing taste. 8. Pick just one salad accessory. Salad isnt automatically a calorie winner. Its easy to over-accessorize with toppings. Instead of topping your salad with nuts, avocado and cranberries (all three will clock in at 313 calories), just pick one. The next day, choose a different accessory, which will also keep your salad interesting. You dont wear all your jewelry every day, right? 9. Ditch the white pasta in favor of spaghetti squash. One cup of cooked spaghetti squash has about 40 calories, compared with traditional spaghetti, which comes with more than 200. Spaghetti squash is also nutrient-dense. Its a good source of fiber and Vitamins A and C, and it can be eaten just like you would eat pasta--with a great  tomato sauce and [place] meatballs or with pesto, tofu and spinach, for example. 10. Dress up your chili, soups and stews with non-fat [place] yogurt instead of sour cream. Just a dollop  of sour cream can set you back 115 calories and a whopping 12g of fat--seven of which are of the artery-clogging variety. Added bonus: [place] yogurt is packed with muscle-building protein, calcium and B Vitamins. 11. Mash cauliflower instead of mashed potatoes. One cup of traditional mashed potatoes--in all their creamy goodness--has more than 200 calories, compared to mashed cauliflower, which you can typically eat for less than 100 calories per 1 cup serving. Cauliflower is a great source of the antioxidant indole-3-carbinol (I3C), which may help reduce the risk of some cancers, like breast cancer. 12. Ditch the ice cream sundae in favor of a [place] yogurt parfait. Instead of a cup of ice cream or fro-yo for dessert, try 1 cup of nonfat Greek yogurt topped with fresh berries and a sprinkle of cacao nibs. Both toppings are packed with antioxidants, which can help reduce cellular inflammation and oxidative damage. And the comparison is a no-brainer: One cup of ice cream has about 275 calories; one cup of frozen yogurt has about 230; and a cup of Greek yogurt has just 130, plus twice the protein, so youre less likely to return to the freezer for a second helping. 13. Put olive oil in a spray container instead of using it directly from the bottle. Each tablespoon of olive oil is 120 calories and 15g of fat. Use a mister instead of pouring it straight into the pan or onto a salad. This allows for portion control and will save you more than 100 calories. 14. When baking, substitute canned pumpkin for butter or oil. Canned pumpkin--not pumpkin pie mix--is loaded with Vitamin A, which is important for skin and eye health, as well as immunity. And the comparisons are pretty crazy:  cup of canned pumpkin has about 40 calories,  compared to butter or oil, which has more than 800 calories. Yes, 800 calories. Applesauce and mashed banana can also serve as good substitutions for butter or oil, usually in a 1:1 ratio. 15. Top casseroles with high-fiber cereal instead of breadcrumbs. Breadcrumbs are typically made with white bread, while breakfast cereals contain 5-9g of fiber per serving. Not only will you save more than 150 calories per  cup serving, the swap will also keep you more full and youll get a metabolism boost from the added fiber. 16. Snack on pistachios instead of macadamia nuts. Believe it or not, you get the same amount of calories from 35 pistachios (100 calories) as you would from only five macadamia nuts. 17. Chow down on kale chips rather than potato chips. This is my favorite dont knock it till you try it swap. Kale chips are so easy to make at home, and you can spice them up with a little grated parmesan or chili powder. Plus, theyre a mere fraction of the calories of potato chips, but with the same crunch factor we crave so often. 18. Add seltzer and some fruit slices to your cocktail instead of soda or fruit juice. One cup of soda or fruit juice can pack on as much as 140 calories. Instead, use seltzer and fruit slices. The fruit provides valuable phytochemicals, such as flavonoids and anthocyanins, which help to combat cancer and stave off the aging process.

## 2017-08-08 NOTE — Telephone Encounter (Signed)
please call patient: I received a message from Dr Sharee Holster, about your diabetes medications.  Please move up next ov to next available.

## 2017-08-08 NOTE — Progress Notes (Signed)
Impression and Recommendations:    1. Obesity, Class III, BMI 40-49.9 (morbid obesity) ([place])   2. Mixed diabetic hyperlipidemia associated with type 1 diabetes mellitus (HCC)   3. Statin intolerance   4. Type 1 diabetes mellitus with microalbuminuria (HCC)   5. Hypertension associated with diabetes ([place])   6. Hx of BKA, left - with subsequent cellulitis and treatment for osteomylitis   7. Vitamin D deficiency   8. Low HDL (under 40)   9. Chronic fatigue   10. Impaired exercise tolerance   11. Low hemoglobin     1. Desire for Weight Loss & Gastric Bypass - Per pt, ready for lifestyle changes in order to take better care of himself.  He wants to improve his health for the sake of his family, and is tired of having poor quality of life.   - Educated patient at length about adopting prudent lifestyle habits and healthy practices.  Handouts provided. - Significant portion of appointment was spent today educating patient about need for lifestyle changes.  - Advised patient about alternative DM medications & management options which may assist with weight loss.  Dr. Loanne Drilling contacted to assess if something like Saxenda for DM management & weight loss would be appropriate to add to pt treatment plan.  - Strongly encouraged patient to follow-up for weight loss consults.  - Reviewed nutritional intake tracking with the patient today.  Encouraged patient to install tools like the LoseIt app to aid with following prudent diet.  2. Statin Use - Leg Pain S-E - Discontinue Crestor today.  Pt with leg pain even on very low dose. - Patient quit taking for the past 3 weeks; was only taking on Wednesdays and Sundays.  - Zetia started today; may consider Colestid in future if needed. - Educated patient about critical need for hydration to mitigate all S-E of medications.  3. Blood Pressure - Sightly Elevated in Office Today - Ambulatory monitoring encouraged.  Reviewed goal blood pressure  today.  4. BMI Counseling Explained to patient what BMI refers to, and what it means medically.    Told patient to think about it as a "medical risk stratification measurement" and how increasing BMI is associated with increasing risk/ or worsening state of various diseases such as hypertension, hyperlipidemia, diabetes, premature OA, depression etc.  American Heart Association guidelines for healthy diet, basically Mediterranean diet, and exercise guidelines of 30 minutes 5 days per week or more discussed in detail.  Health counseling performed.  All questions answered.  5. Lifestyle & Preventative Health Maintenance - Advised patient to continue working toward exercising to improve overall mental, physical, and emotional health.    - Encouraged patient to engage in daily physical activity as tolerated, especially a formal exercise routine.  Recommended that the patient eventually strive for at least 150 minutes of moderate cardiovascular activity per week according to guidelines established by the [facility].   - Healthy dietary habits encouraged, including low-carb, and high amounts of lean protein in diet.   - Patient should also consume adequate amounts of water - half of body weight in oz of water per day.   Education and routine counseling performed. Handouts provided.  6. Follow-Up - Blood work drawn today.  In 4 weeks, return for lab review.  - Install LoseIt app.  - Patient Goal: Will begin tracking all caloric intake per day.   Orders Placed This Encounter  Procedures  . Microalbumin / creatinine urine ratio    Medications  Discontinued During This Encounter  Medication Reason  . diazepam (VALIUM) 10 MG tablet Completed Course  . rosuvastatin (CRESTOR) 10 MG tablet Discontinued by provider      Meds ordered this encounter  Medications  . ezetimibe (ZETIA) 10 MG tablet    Sig: Take 1 tablet (10 mg total) by mouth at bedtime.    Dispense:  90 tablet    Refill:  1     Return in about 1 month (around 09/05/2017) for wt loss- go over lose it app- TRACK everything.   The patient was counseled, risk factors were discussed, anticipatory guidance given.  Gross side effects, risk and benefits, and alternatives of medications discussed with patient.  Patient is aware that all medications have potential side effects and we are unable to predict every side effect or drug-drug interaction that may occur.  Expresses verbal understanding and consents to current therapy plan and treatment regimen.  Please see AVS handed out to patient at the end of our visit for further patient instructions/ counseling done pertaining to today's office visit.    Note:  This document was prepared using Dragon voice recognition software and may include unintentional dictation errors.  This document serves as a record of services personally performed by Mellody Dance, DO. It was created on her behalf by Toni Amend, a trained medical scribe. The creation of this record is based on the scribe's personal observations and the provider's statements to them.   I have reviewed the above medical documentation for accuracy and completeness and I concur.  Mellody Dance 08/21/17 10:41 AM     Subjective:    Chief Complaint  Patient presents with  . Follow-up    Michael Hutchinson is a 42 y.o. male who presents to [place] at [facility] today for Diabetes Management.    Desire for Weight Loss & Gastric Bypass Patient is diabetic, but also here today to consult about his desire for lifestyle changes and gastric bypass.  States "I haven't been good to me" and notes he wants to "get to the bottom line" and "stop feeling like shit."  He wants to be there for his family and is tired of having poor quality of life.  Recently went to an informational consult for gastric bypass.  He called his insurance and was told he has a year waiting process.  Notes that his insurance  requires a year of patient-physician consults leading up to gastric bypass.  Patient is amenable to trying to adopt healthier lifestyle habits and lose weight on his own before surgery.  Statin Use States that Crestor is causing his legs to hurt.  He has stopped taking his medication. Patient is willing to try an alternative cholesterol medication aside from statins.  DM HPI: -  He has not been working on diet and exercise for diabetes  Pt is currently maintained on the following medications for diabetes:   see med list today Medication compliance - continues on medications as prescribed.   Denies polyuria/polydipsia. Denies hypo/ hyperglycemia symptoms - He denies new onset of: chest pain, exercise intolerance, shortness of breath, dizziness, visual changes, headache, lower extremity swelling or claudication.   Last diabetic eye exam was No results found for: HMDIABEYEEXA  Foot exam- UTD  Last A1C in the office was:  Lab Results  Component Value Date   HGBA1C 7.4 (H) 08/08/2017   HGBA1C 6.9 (A) 06/01/2017   HGBA1C 9.2 12/26/2016    Lab Results  Component Value Date  LDLCALC 147 (H) 10/28/2016   CREATININE 0.94 08/08/2017      Last 3 blood pressure readings in our office are as follows: BP Readings from Last 3 Encounters:  08/17/17 (!) 140/92  08/08/17 128/84  07/17/17 (!) 148/92    BMI Readings from Last 3 Encounters:  08/17/17 44.89 kg/m  08/08/17 45.03 kg/m  07/17/17 45.24 kg/m     No problems updated.    Patient Care Team    Relationship Specialty Notifications Start End  Mellody Dance, DO PCP - General Family Medicine All results, Admissions 12/31/15   Fayette Pho, PA Attending Physician Orthopedic Surgery  06/01/16   [name], MD Consulting Physician Orthopedic Surgery  07/04/16   Campbell Riches, MD Consulting Physician Infectious Diseases  07/04/16   Pieter Partridge, DO Consulting Physician Neurology  05/29/17   Renato Shin, MD  Consulting Physician Endocrinology  05/29/17    Comment: Treatment of diabetes  Philemon Kingdom, MD Consulting Physician Internal Medicine  07/11/17    Comment: Endocrinologist     Patient Active Problem List   Diagnosis Date Noted  . Multiple sclerosis ([place]) 07/11/2017    Priority: High  . Hx of BKA, left - with subsequent cellulitis and treatment for osteomylitis 06/01/2016    Priority: High  . Obesity, Class III, BMI 40-49.9 (morbid obesity) ([place]) 09/22/2015    Priority: High  . Diabetes mellitus type 1 (HCC)     Priority: High  . Hypertension associated with diabetes ([place]) 05/03/2013    Priority: High  . Mixed diabetic hyperlipidemia associated with type 1 diabetes mellitus ([place]) 07/26/2012    Priority: High  . Low HDL (under 40) 09/22/2015    Priority: Medium  . GERD (gastroesophageal reflux disease)     Priority: Medium  . Hx MRSA infection 09/22/2015    Priority: Low  . Statin intolerance 08/08/2017  . Low hemoglobin 07/11/2017  . Lateral epicondylitis of right elbow 03/20/2017  . Pain in joint of right elbow 03/20/2017  . Chronic fatigue 07/04/2016  . Impaired exercise tolerance 07/04/2016  . Acute left otitis media 06/09/2016  . Abscess of left leg 04/26/2016  . Diabetes mellitus with complication ([place])   . Normocytic anemia 01/01/2016  . Osteomyelitis ([place]) 12/31/2015  . Diabetic ulcer of left heel associated with type 1 diabetes mellitus, with necrosis of bone ([place]) 12/31/2015  . Ankle wound 12/31/2015  . Chronic osteomyelitis of left ankle with draining sinus ([place])   . Type 1 diabetes, uncontrolled, with Charcot's joint of foot ([place]) 10/12/2015  . H/O noncompliance with medical treatment, presenting hazards to health 09/22/2015  . Vitamin D deficiency 09/22/2015  . Charcot's joint of foot 11/25/2013  . Diabetic ketoacidosis ([place])   . Meniscus tear   . Epigastric pain 12/10/2011  . Vomiting 12/10/2011  . Problems influencing health status 11/21/2011      Past Medical History:  Diagnosis Date  . Charcot's joint of foot 11/25/2013  . Complication of anesthesia    "I wake up angry" (12/31/2015)  . Diabetes mellitus type 1 ([place]) dx'd 1981  . Diabetic ketoacidosis ([place])   . Essential hypertension 05/03/2013  . GERD (gastroesophageal reflux disease)   . High cholesterol   . Hx MRSA infection    Inner thigh and under arm- healed areas  . Meniscus tear   . Shortness of breath    with exertion only     Past Surgical History:  Procedure Laterality Date  . AMPUTATION Left 01/01/2016   Procedure: AMPUTATION BELOW  KNEE;  Surgeon: [name], MD;  Location: [facility];  Service: Orthopedics;  Laterality: Left;  . APPLICATION OF WOUND VAC  04/26/2016  . FRACTURE SURGERY    . HERNIA REPAIR    . I&D EXTREMITY Left 04/26/2016   Procedure: IRRIGATION AND DEBRIDEMENT EXTREMITY/Left Leg/Possible Wound Vac;  Surgeon: [name], MD;  Location: [facility];  Service: Orthopedics;  Laterality: Left;  . INCISE AND DRAIN ABCESS Left 04/26/2016  . KNEE ARTHROSCOPY Left ~ 2010  . LAPAROSCOPIC CHOLECYSTECTOMY  2015  . METACARPOPHALANGEAL JOINT ARTHRODESIS Left 06/2012   Fracture left index finger intra-articular MCP joint/notes 06/30/2012  . OPEN REDUCTION INTERNAL FIXATION (ORIF) PROXIMAL PHALANX Left 06/30/2012   Procedure: OPEN REDUCTION INTERNAL FIXATION (ORIF) LEFT INDEX FINGER PROXIMAL PHALANX FRACTURE WITH LIGAMENT REPAIR AS NECESSARY;  Surgeon: [name], MD;  Location: [facility];  Service: Orthopedics;  Laterality: Left;  . UMBILICAL HERNIA REPAIR  2015   "w/gallbladder OR"     Family History  Problem Relation Age of Onset  . Other Mother   . Cancer Mother        Breast / Bone  . Heart attack Father   . Hypertension Father   . Hyperlipidemia Father   . Diabetes Unknown   . Alcohol abuse Sister   . Diabetes Maternal Grandfather   . Stroke Paternal Grandmother   . Alcohol abuse Paternal Grandfather      Social History   Substance and  Sexual Activity  Drug Use No  ,  Social History   Substance and Sexual Activity  Alcohol Use Yes  . Alcohol/week: 14.0 standard drinks  . Types: 14 Cans of beer per week  ,  Social History   Tobacco Use  Smoking Status Never Smoker  Smokeless Tobacco Former Systems developer  . Types: Snuff, Chew  ,    Current Outpatient Medications on File Prior to Visit  Medication Sig Dispense Refill  . aspirin 81 MG chewable tablet Chew 1 tablet (81 mg total) by mouth daily. 30 tablet 0  . BAYER MICROLET LANCETS lancets Use as instructed to check 3 times daily. 100 each 12  . Blood Glucose Monitoring Suppl (ONE TOUCH ULTRA SYSTEM KIT) w/Device KIT 1 kit by Does not apply route once. Check blood sugar 3 times daily. Diagnosis Diabetes ICD-10 E11.8    . glucagon (GLUCAGON EMERGENCY) 1 MG injection Inject 1 mg into the muscle once as needed. (Patient taking differently: Inject 1 mg into the muscle daily as needed (hypoglycemia). ) 1 each 11  . glucose blood (BAYER CONTOUR NEXT TEST) test strip 1 each by Other route 4 (four) times daily. And lancets 4/day 400 each 3  . ibuprofen (ADVIL,MOTRIN) 800 MG tablet Take 1 tablet (800 mg total) by mouth 3 (three) times daily. 21 tablet 0  . insulin lispro (HUMALOG) 100 UNIT/ML injection Use 60 units a day via insulin pump. 3 vial 11  . losartan-hydrochlorothiazide (HYZAAR) 50-12.5 MG tablet 1/2 tablet twice daily 90 tablet 1  . montelukast (SINGULAIR) 10 MG tablet Take 1 tablet (10 mg total) by mouth at bedtime. 30 tablet 3  . ondansetron (ZOFRAN) 4 MG tablet Take 1 tablet (4 mg total) by mouth every 6 (six) hours as needed for nausea or vomiting. 30 tablet 1  . Vitamin D, Ergocalciferol, (DRISDOL) 50000 units CAPS capsule 1 tab every Wednesday and Sunday 24 capsule 10   No current facility-administered medications on file prior to visit.      Allergies  Allergen Reactions  . Influenza  Vac Split Quad Nausea And Vomiting  . Hydrochlorothiazide Other (See Comments)     Dizziness  . Lisinopril Cough  . Losartan Other (See Comments)    Dizziness     Review of Systems:   General:  Denies fever, chills Optho/Auditory:   Denies visual changes, blurred vision Respiratory:   Denies SOB, cough, wheeze, DIB  Cardiovascular:   Denies chest pain, palpitations, painful respirations Gastrointestinal:   Denies nausea, vomiting, diarrhea.  Endocrine:     Denies new hot or cold intolerance Musculoskeletal:  Denies joint swelling, gait issues, or new unexplained myalgias/ arthralgias Skin:  Denies rash, suspicious lesions  Neurological:    Denies dizziness, unexplained weakness, numbness  Psychiatric/Behavioral:   Denies mood changes    Objective:     Blood pressure 128/84, pulse 90, height 6' (1.829 m), weight (!) 332 lb (150.6 kg), SpO2 97 %.  Body mass index is 45.03 kg/m.  General: Well Developed, well nourished, and in no acute distress.  HEENT: Normocephalic, atraumatic, pupils equal round reactive to light, neck supple, No carotid bruits, no JVD Skin: Warm and dry, cap RF less 2 sec Cardiac: Regular rate and rhythm, S1, S2 WNL's, no murmurs rubs or gallops Respiratory: ECTA B/L, Not using accessory muscles, speaking in full sentences. NeuroM-Sk: Ambulates w/o assistance, moves ext * 4 w/o difficulty, sensation grossly intact.  Ext: scant edema b/l lower ext Psych: No HI/SI, judgement and insight good, Euthymic mood. Full Affect.

## 2017-08-09 ENCOUNTER — Telehealth: Payer: Self-pay | Admitting: Family Medicine

## 2017-08-09 LAB — COMPREHENSIVE METABOLIC PANEL
A/G RATIO: 1.7 (ref 1.2–2.2)
ALT: 66 IU/L — AB (ref 0–44)
AST: 46 IU/L — ABNORMAL HIGH (ref 0–40)
Albumin: 4.2 g/dL (ref 3.5–5.5)
Alkaline Phosphatase: 75 IU/L (ref 39–117)
BUN/Creatinine Ratio: 12 (ref 9–20)
BUN: 11 mg/dL (ref 6–24)
Bilirubin Total: 0.5 mg/dL (ref 0.0–1.2)
CHLORIDE: 97 mmol/L (ref 96–106)
CO2: 21 mmol/L (ref 20–29)
Calcium: 9.3 mg/dL (ref 8.7–10.2)
Creatinine, Ser: 0.94 mg/dL (ref 0.76–1.27)
GFR calc non Af Amer: 101 mL/min/{1.73_m2} (ref >59)
GFR, EST AFRICAN AMERICAN: 117 mL/min/{1.73_m2} (ref >59)
Globulin, Total: 2.5 g/dL (ref 1.5–4.5)
Glucose: 203 mg/dL — ABNORMAL HIGH (ref 65–99)
Potassium: 5 mmol/L (ref 3.5–5.2)
SODIUM: 136 mmol/L (ref 134–144)
TOTAL PROTEIN: 6.7 g/dL (ref 6.0–8.5)

## 2017-08-09 LAB — CBC WITH DIFFERENTIAL/PLATELET
BASOS: 1 %
Basophils Absolute: 0 10*3/uL (ref 0.0–0.2)
EOS (ABSOLUTE): 0.2 10*3/uL (ref 0.0–0.4)
EOS: 5 %
HEMATOCRIT: 39 % (ref 37.5–51.0)
Hemoglobin: 12.9 g/dL — ABNORMAL LOW (ref 13.0–17.7)
Immature Grans (Abs): 0 10*3/uL (ref 0.0–0.1)
Immature Granulocytes: 1 %
LYMPHS ABS: 0.7 10*3/uL (ref 0.7–3.1)
Lymphs: 19 %
MCH: 29.6 pg (ref 26.6–33.0)
MCHC: 33.1 g/dL (ref 31.5–35.7)
MCV: 89 fL (ref 79–97)
MONOS ABS: 0.4 10*3/uL (ref 0.1–0.9)
Monocytes: 12 %
NEUTROS ABS: 2.3 10*3/uL (ref 1.4–7.0)
Neutrophils: 62 %
Platelets: 275 10*3/uL (ref 150–450)
RBC: 4.36 x10E6/uL (ref 4.14–5.80)
RDW: 13.3 % (ref 12.3–15.4)
WBC: 3.6 10*3/uL (ref 3.4–10.8)

## 2017-08-09 LAB — MICROALBUMIN / CREATININE URINE RATIO
Creatinine, Urine: 24 mg/dL
Microalb/Creat Ratio: 100 mg/g creat — ABNORMAL HIGH (ref 0.0–30.0)
Microalbumin, Urine: 24 ug/mL

## 2017-08-09 LAB — T4, FREE: FREE T4: 1.5 ng/dL (ref 0.82–1.77)

## 2017-08-09 LAB — VITAMIN B12: VITAMIN B 12: 412 pg/mL (ref 232–1245)

## 2017-08-09 LAB — TSH: TSH: 2.26 u[IU]/mL (ref 0.450–4.500)

## 2017-08-09 LAB — HEMOGLOBIN A1C
Est. average glucose Bld gHb Est-mCnc: 166 mg/dL
Hgb A1c MFr Bld: 7.4 % — ABNORMAL HIGH (ref 4.8–5.6)

## 2017-08-09 LAB — FOLATE: FOLATE: 12.4 ng/mL (ref >3.0)

## 2017-08-09 LAB — IRON AND TIBC
IRON SATURATION: 47 % (ref 15–55)
Iron: 127 ug/dL (ref 38–169)
Total Iron Binding Capacity: 268 ug/dL (ref 250–450)
UIBC: 141 ug/dL (ref 111–343)

## 2017-08-09 LAB — TRANSFERRIN: Transferrin: 207 mg/dL (ref 200–370)

## 2017-08-09 LAB — VITAMIN D 25 HYDROXY (VIT D DEFICIENCY, FRACTURES): Vit D, 25-Hydroxy: 45.6 ng/mL (ref 30.0–100.0)

## 2017-08-09 LAB — T3, FREE: T3 FREE: 3.4 pg/mL (ref 2.0–4.4)

## 2017-08-09 LAB — FERRITIN: Ferritin: 380 ng/mL (ref 30–400)

## 2017-08-09 NOTE — Telephone Encounter (Signed)
I called patient but lost connect due to cell phone reception. He said that he was fine with moving up appointment & I moved up to first available 8/27 at 8 am. I tried to call back to make sure this was ok, but received VM.  I will cancel other appointment for September when I hear from patient.

## 2017-08-09 NOTE — Telephone Encounter (Signed)
Called patient and notified. Dr. George Hugh office has already called and made appointment for the patient.  MPulliam, CMA/RT(R)

## 2017-08-09 NOTE — Telephone Encounter (Signed)
Call patient please let him know I spoke with Dr. Everardo All and he would rather make changes to patient's diabetes medicine, even the ones we discussed that could aid him in weight loss.  Thus please let him know the will be getting in touch with him in the near future to make sooner follow-up to discuss these med changes per

## 2017-08-10 ENCOUNTER — Telehealth: Payer: Self-pay | Admitting: Neurology

## 2017-08-10 NOTE — Telephone Encounter (Signed)
Patient called regarding his MS medication changing. He said he was to call and let you know which medication he preferred. He said it's in a [employer] and "like an insulin pen" He also said he had his lab taken at his PCP which is through [facility]. He also said he will be out of what he is taking tonight. He uses [facility]. Thanks

## 2017-08-11 ENCOUNTER — Other Ambulatory Visit: Payer: BLUE CROSS/BLUE SHIELD

## 2017-08-11 ENCOUNTER — Telehealth: Payer: Self-pay | Admitting: Family Medicine

## 2017-08-11 NOTE — Telephone Encounter (Signed)
Please review labs from 08/08/17 and patient will check MyChart. MPulliam, CMA/RT(R)

## 2017-08-11 NOTE — Telephone Encounter (Signed)
Patient called wanted to know if lab results are in ---advised labs are pending provider review & release .  --forwarding message to medical assistant to review w/provider for release & pt will access Mychart if no phone call to be given.  --glh

## 2017-08-11 NOTE — Telephone Encounter (Signed)
Called and LMOVM for Pt to call me back on Monday

## 2017-08-14 NOTE — Telephone Encounter (Signed)
Patient was told to follow-up in 4 weeks for labs 6 weeks for office visit to review them.  I told patient we would review them at his follow-up OV.    Please advise him there is nothing critical and/or significant in his labs that is deviated from his usual.  We will discuss at follow-up

## 2017-08-15 NOTE — Telephone Encounter (Signed)
Spoke to patient and notified of his labs. MPulliam, CMA/RT(R)

## 2017-08-16 NOTE — Progress Notes (Signed)
NEUROLOGY FOLLOW UP OFFICE NOTE  Michael Hutchinson [id number]  HISTORY OF PRESENT ILLNESS:  Michael Hutchinson is a 42 year old right-handed male with type 1 diabetes mellitus with history of DKA, hypertension, Charcot's joint of foot, left below the knee amputation, hyperlipidemia and GERD who follow up for multiple sclerosis.  He is accompanied by his wife who supplements history.  UPDATE: He was initially started on Tecfidera, however he had significant GI upset so he would like to change DMT.  After review of options, he decided on Copaxone.  He is taking D3 50,000 IU twice weekly.  08/08/17 LABS:  CBC with WBC 3.6, HGB 12.9, HCT 39, PLT 275, ALC 0.7 10E3/uL; CMP with Na 136, K 5, glucose 203, BUN 11, Cr 0.94, t bili 0.5, ALP 75, AST 46, ALT 66; D 25-hydroxy 45.6; TSH 2.260; B12 412, folate 12.4; Hgb A1c 7.4.  HISTORY: In October 2016, he developed a right sixth cranial nerve palsy, which was thought to be due to his uncontrolled type I diabetes.     In March 2017, he developed blurred vision in the left eye.  He saw ophthalmologist, Dr. Julian Reil, who diagnosed optic neuritis.  MRI of brain and orbits with and without contrast from 05/11/15 was personally reviewed and revealed "2 T2 hyperintense lesions within the left hemisphere adjacent to the posterior horn of the left lateral ventricle", a nonspecific finding and without abnormal contrast enhancement.     In March, he developed dysconjugate gaze and droopy left eyelid.  He saw the eye doctor who diagnosed a left third nerve palsy.  He was evaluated in the ED at [facility] on 03/28/17.  CTA of head was negative for aneurysm.  MRI of brain with and without contrast was personally reviewed and demonstrated multiple T2/FLAIR hyperintense foci involving the periventricular, deep and subcortical white matter, progressed compared to prior MRI from 2017.  In-house neurology believed his findings more likely due to a diabetic third  nerve palsy rather than MS.  He was advised to start ASA '81mg'$  daily and follow up with outpatient neurology.  He subsequently underwent workup for multiple sclerosis.  MRI of cervical spine with and without contrast from 05/04/17 was personally reviewed and revealed no cord lesions.  He underwent lumbar puncture on 05/19/17 for CSF analysis, which revealed elevated IgG Index of 0.79 and 4 oligoclonal bands not present in the corresponding serum.  Cell count was 1, elevated protein 157, elevated glucose 100, myelin basic protein negative, ACE 4, gram stain and culture negative.  PAST MEDICAL HISTORY: Past Medical History:  Diagnosis Date  . Charcot's joint of foot 11/25/2013  . Complication of anesthesia    "I wake up angry" (12/31/2015)  . Diabetes mellitus type 1 ([place]) dx'd 1981  . Diabetic ketoacidosis ([place])   . Essential hypertension 05/03/2013  . GERD (gastroesophageal reflux disease)   . High cholesterol   . Hx MRSA infection    Inner thigh and under arm- healed areas  . Meniscus tear   . Shortness of breath    with exertion only    MEDICATIONS: Current Outpatient Medications on File Prior to Visit  Medication Sig Dispense Refill  . aspirin 81 MG chewable tablet Chew 1 tablet (81 mg total) by mouth daily. 30 tablet 0  . BAYER MICROLET LANCETS lancets Use as instructed to check 3 times daily. 100 each 12  . Blood Glucose Monitoring Suppl (ONE TOUCH ULTRA SYSTEM KIT) w/Device KIT 1 kit  by Does not apply route once. Check blood sugar 3 times daily. Diagnosis Diabetes ICD-10 E11.8    . ezetimibe (ZETIA) 10 MG tablet Take 1 tablet (10 mg total) by mouth at bedtime. 90 tablet 1  . glucagon (GLUCAGON EMERGENCY) 1 MG injection Inject 1 mg into the muscle once as needed. (Patient taking differently: Inject 1 mg into the muscle daily as needed (hypoglycemia). ) 1 each 11  . glucose blood (BAYER CONTOUR NEXT TEST) test strip 1 each by Other route 4 (four) times daily. And lancets 4/day 400 each 3    . ibuprofen (ADVIL,MOTRIN) 800 MG tablet Take 1 tablet (800 mg total) by mouth 3 (three) times daily. 21 tablet 0  . insulin lispro (HUMALOG) 100 UNIT/ML injection Use 60 units a day via insulin pump. 3 vial 11  . losartan-hydrochlorothiazide (HYZAAR) 50-12.5 MG tablet 1/2 tablet twice daily 90 tablet 1  . montelukast (SINGULAIR) 10 MG tablet Take 1 tablet (10 mg total) by mouth at bedtime. 30 tablet 3  . ondansetron (ZOFRAN) 4 MG tablet Take 1 tablet (4 mg total) by mouth every 6 (six) hours as needed for nausea or vomiting. 30 tablet 1  . Vitamin D, Ergocalciferol, (DRISDOL) 50000 units CAPS capsule 1 tab every Wednesday and Sunday 24 capsule 10   No current facility-administered medications on file prior to visit.     ALLERGIES: Allergies  Allergen Reactions  . Influenza Vac Split Quad Nausea And Vomiting  . Hydrochlorothiazide Other (See Comments)    Dizziness  . Lisinopril Cough  . Losartan Other (See Comments)    Dizziness    FAMILY HISTORY: Family History  Problem Relation Age of Onset  . Other Mother   . Cancer Mother        Breast / Bone  . Heart attack Father   . Hypertension Father   . Hyperlipidemia Father   . Diabetes Unknown   . Alcohol abuse Sister   . Diabetes Maternal Grandfather   . Stroke Paternal Grandmother   . Alcohol abuse Paternal Grandfather     SOCIAL HISTORY: Social History   Socioeconomic History  . Marital status: Married    Spouse name: Estill Bamberg  . Number of children: 2  . Years of education: Not on file  . Highest education level: High school graduate  Occupational History  . Occupation: Dealer  Social Needs  . Financial resource strain: Not on file  . Food insecurity:    Worry: Not on file    Inability: Not on file  . Transportation needs:    Medical: Not on file    Non-medical: Not on file  Tobacco Use  . Smoking status: Never Smoker  . Smokeless tobacco: Former Systems developer    Types: Snuff, Chew  Substance and Sexual Activity  .  Alcohol use: Yes    Alcohol/week: 14.0 standard drinks    Types: 14 Cans of beer per week  . Drug use: No  . Sexual activity: Yes  Lifestyle  . Physical activity:    Days per week: Not on file    Minutes per session: Not on file  . Stress: Not on file  Relationships  . Social connections:    Talks on phone: Not on file    Gets together: Not on file    Attends religious service: Not on file    Active member of club or organization: Not on file    Attends meetings of clubs or organizations: Not on file    Relationship status:  Not on file  . Intimate partner violence:    Fear of current or ex partner: Not on file    Emotionally abused: Not on file    Physically abused: Not on file    Forced sexual activity: Not on file  Other Topics Concern  . Not on file  Social History Narrative   Pt is a right handed married man, lives with his wife and 2 children ages 7 and 73, in a single story house. Pt drinks 6-8, 16oz diet sodas a day.     REVIEW OF SYSTEMS: Constitutional: No fevers, chills, or sweats, no generalized fatigue, change in appetite Eyes: No visual changes, double vision, eye pain Ear, nose and throat: No hearing loss, ear pain, nasal congestion, sore throat Cardiovascular: No chest pain, palpitations Respiratory:  No shortness of breath at rest or with exertion, wheezes GastrointestinaI: No nausea, vomiting, diarrhea, abdominal pain, fecal incontinence Genitourinary:  No dysuria, urinary retention or frequency Musculoskeletal:  No neck pain, back pain Integumentary: No rash, pruritus, skin lesions Neurological: as above Psychiatric: No depression, insomnia, anxiety Endocrine: No palpitations, fatigue, diaphoresis, mood swings, change in appetite, change in weight, increased thirst Hematologic/Lymphatic:  No purpura, petechiae. Allergic/Immunologic: no itchy/runny eyes, nasal congestion, recent allergic reactions, rashes  PHYSICAL EXAM: Vitals:   08/17/17 0800  BP:  (!) 140/92  Pulse: 95  SpO2: 97%   General: No acute distress.  Patient appears well-groomed.  Morbidly obese body habitus. Head:  Normocephalic/atraumatic Eyes:  Fundi examined but not visualized Neck: supple, no paraspinal tenderness, full range of motion Heart:  Regular rate and rhythm Lungs:  Clear to auscultation bilaterally Back: No paraspinal tenderness Neurological Exam: alert and oriented to person, place, and time. Attention span and concentration intact, recent and remote memory intact, fund of knowledge intact.  Speech fluent and not dysarthric, language intact.  Slight reduced adduction of left eye.  Otherwise, CN II-XII intact. Bulk and tone normal, muscle strength 5/5 throughout.  Sensation to light touch intact.  Deep tendon reflexes 2+ throughout, toes downgoing.  Finger to nose testing intact.  Gait normal  IMPRESSION: 1.  Multiple sclerosis.  While ischemic cranial nerve palsies are seen in uncontrolled diabetes, given the 3 distinct episodes of various cranial nerve palsies, the appearance of white matter lesions on brain MRI, and CSF analysis, I suspect MS.  He does have a significantly elevated CSF protein.  He does not exhibit any sign of infection.  Diabetes may be a contributor to this. 2.  Morbid obesity (BMI 44.89 kg/m2) 3.  Hypertension  PLAN: 1.  Switch from Tecfidera to Copaxone '40mg'$  three days weekly (M/W/F) 2.  Continue vitamin D supplementation.  Recheck level in 6 months 3.  Weight loss 4.  Follow up blood pressure with PCP 5.  Follow up in 6 months.  19 minutes spent face to face with patient, over 50% spent discussing medication and management.  Metta Clines, DO  CC:  Dr. Raliegh Scarlet        Left eye depressed and abducted on primary gaze, left ptosis

## 2017-08-17 ENCOUNTER — Encounter: Payer: Self-pay | Admitting: Neurology

## 2017-08-17 ENCOUNTER — Ambulatory Visit (INDEPENDENT_AMBULATORY_CARE_PROVIDER_SITE_OTHER): Payer: BLUE CROSS/BLUE SHIELD | Admitting: Neurology

## 2017-08-17 VITALS — BP 140/92 | HR 95 | Ht 72.0 in | Wt 331.0 lb

## 2017-08-17 DIAGNOSIS — G35D Multiple sclerosis, unspecified: Secondary | ICD-10-CM

## 2017-08-17 DIAGNOSIS — E559 Vitamin D deficiency, unspecified: Secondary | ICD-10-CM

## 2017-08-17 DIAGNOSIS — G35 Multiple sclerosis: Secondary | ICD-10-CM | POA: Diagnosis not present

## 2017-08-17 DIAGNOSIS — E10610 Type 1 diabetes mellitus with diabetic neuropathic arthropathy: Secondary | ICD-10-CM | POA: Diagnosis not present

## 2017-08-17 DIAGNOSIS — IMO0002 Reserved for concepts with insufficient information to code with codable children: Secondary | ICD-10-CM

## 2017-08-17 DIAGNOSIS — I10 Essential (primary) hypertension: Secondary | ICD-10-CM

## 2017-08-17 DIAGNOSIS — E1065 Type 1 diabetes mellitus with hyperglycemia: Secondary | ICD-10-CM

## 2017-08-17 NOTE — Telephone Encounter (Signed)
Pt seen in the office

## 2017-08-17 NOTE — Patient Instructions (Addendum)
1.  Stop Tecfidera.  We will start Copaxone 2.  Continue vitamin D.  We will recheck D level in 6 months (prior to follow up) 3.  Follow up in 6 months.

## 2017-08-18 DIAGNOSIS — M7711 Lateral epicondylitis, right elbow: Secondary | ICD-10-CM | POA: Diagnosis not present

## 2017-08-18 DIAGNOSIS — M25521 Pain in right elbow: Secondary | ICD-10-CM | POA: Diagnosis not present

## 2017-08-18 MED ORDER — GLATIRAMER ACETATE 40 MG/ML ~~LOC~~ SOSY: 40.0000 mg | PREFILLED_SYRINGE | SUBCUTANEOUS | 3 refills | Status: DC

## 2017-08-18 NOTE — Addendum Note (Signed)
Addended by: Dorthy Cooler on: 08/18/2017 02:43 PM   Modules accepted: Orders

## 2017-08-21 ENCOUNTER — Telehealth: Payer: Self-pay | Admitting: Neurology

## 2017-08-21 NOTE — Telephone Encounter (Signed)
Called and spoke with Pt. He was called by his pharmacy on Friday, was informed his Capaxone must go thru a specialty pharmacy. I advised him I rcvd a PA request and had submitted it, and rcvd approval. He asked I call and find out status and call him back. [facility], was advised Rx was sent to [facility], p#[phone], spoke with Kennon Rounds, pharmacist. Verified Rx and advise her of PA valid from 08/21/17 - 01/09/18. She said someone will contact the Pt this evening or tomorrow.  Called Pt and advise him.

## 2017-08-21 NOTE — Telephone Encounter (Signed)
Patient called and left a voicemail with questions concerning his new medication. He did not leave the medication name or pharmacy. He is having issues with his pharmacy not being able to fill the prescription. Please call him back at [phone]. Thanks.

## 2017-08-21 NOTE — Progress Notes (Signed)
Initiated on cover my meds.  Rcvd approval 08/21/17 - 01/09/18

## 2017-08-23 ENCOUNTER — Ambulatory Visit: Payer: BLUE CROSS/BLUE SHIELD | Admitting: Neurology

## 2017-08-26 DIAGNOSIS — M25521 Pain in right elbow: Secondary | ICD-10-CM | POA: Diagnosis not present

## 2017-08-29 ENCOUNTER — Ambulatory Visit: Payer: BLUE CROSS/BLUE SHIELD | Admitting: Family Medicine

## 2017-08-29 DIAGNOSIS — M25521 Pain in right elbow: Secondary | ICD-10-CM | POA: Diagnosis not present

## 2017-08-29 DIAGNOSIS — M7711 Lateral epicondylitis, right elbow: Secondary | ICD-10-CM | POA: Diagnosis not present

## 2017-09-05 ENCOUNTER — Ambulatory Visit (INDEPENDENT_AMBULATORY_CARE_PROVIDER_SITE_OTHER): Payer: BLUE CROSS/BLUE SHIELD | Admitting: Endocrinology

## 2017-09-05 ENCOUNTER — Encounter: Payer: Self-pay | Admitting: Endocrinology

## 2017-09-05 VITALS — BP 136/92 | HR 91 | Ht 72.0 in | Wt 312.0 lb

## 2017-09-05 DIAGNOSIS — E1065 Type 1 diabetes mellitus with hyperglycemia: Secondary | ICD-10-CM | POA: Diagnosis not present

## 2017-09-05 DIAGNOSIS — E10610 Type 1 diabetes mellitus with diabetic neuropathic arthropathy: Secondary | ICD-10-CM | POA: Diagnosis not present

## 2017-09-05 DIAGNOSIS — IMO0002 Reserved for concepts with insufficient information to code with codable children: Secondary | ICD-10-CM

## 2017-09-05 MED ORDER — SEMAGLUTIDE(0.25 OR 0.5MG/DOS) 2 MG/1.5ML ~~LOC~~ SOPN: 0.5000 mg | PEN_INJECTOR | SUBCUTANEOUS | 11 refills | Status: DC

## 2017-09-05 NOTE — Patient Instructions (Addendum)
Please take these pump settings:  basal rate of 0.4 units/hr, 24 HRS per day (when not in auto mode) mealtime bolus of 1 unit/12 grams carbohydrate.  correction bolus (which some people call "sensitivity," or "insulin sensitivity ratio," or just "isr") of 1 unit for each 50 by which your glucose exceeds 120.  I have sent a prescription to your pharmacy, to add "Ozempic." Please come back for a follow-up appointment in 2 months.

## 2017-09-05 NOTE — Progress Notes (Signed)
Subjective:    Patient ID: Michael Hutchinson, male    DOB: Nov 04, 1975, 42 y.o.   MRN: [id number]  HPI Pt returns for f/u of diabetes mellitus: DM type: 1 Dx'ed: 4163 Complications: left BKA, polyneuropathy, renal insuff, PAD, and right charcot foot.  Therapy: insulin since dx, and pump rx ([id number]), since 2018.   DKA: once, in 2009 Severe hypoglycemia: once, in 2008.   Pancreatitis: never Pancreatic imaging: never Interval History: He takes these settings:  basal rate of 0.6 units/hr, 24 HRS per day.    mealtime bolus of 1 unit/12 grams carbohydrate.  correction bolus (which some people call "sensitivity," or "insulin sensitivity ratio," or just "isr") of 1 unit for each 50 by which your glucose exceeds 120.   Total daily dosage: approx 50 units per day.  Our office has downloaded continuous glucose monitor data, and we reviewed together.  It varies from 40-340, but the vast majority are in the 100's.  There is little if any trend throughout the day.  Past Medical History:  Diagnosis Date  . Charcot's joint of foot 11/25/2013  . Complication of anesthesia    "I wake up angry" (12/31/2015)  . Diabetes mellitus type 1 ([place]) dx'd 1981  . Diabetic ketoacidosis ([place])   . Essential hypertension 05/03/2013  . GERD (gastroesophageal reflux disease)   . High cholesterol   . Hx MRSA infection    Inner thigh and under arm- healed areas  . Meniscus tear   . Shortness of breath    with exertion only    Past Surgical History:  Procedure Laterality Date  . AMPUTATION Left 01/01/2016   Procedure: AMPUTATION BELOW KNEE;  Surgeon: [name], MD;  Location: [facility];  Service: Orthopedics;  Laterality: Left;  . APPLICATION OF WOUND VAC  04/26/2016  . FRACTURE SURGERY    . HERNIA REPAIR    . I&D EXTREMITY Left 04/26/2016   Procedure: IRRIGATION AND DEBRIDEMENT EXTREMITY/Left Leg/Possible Wound Vac;  Surgeon: [name], MD;  Location: [facility];  Service: Orthopedics;  Laterality: Left;  .  INCISE AND DRAIN ABCESS Left 04/26/2016  . KNEE ARTHROSCOPY Left ~ 2010  . LAPAROSCOPIC CHOLECYSTECTOMY  2015  . METACARPOPHALANGEAL JOINT ARTHRODESIS Left 06/2012   Fracture left index finger intra-articular MCP joint/notes 06/30/2012  . OPEN REDUCTION INTERNAL FIXATION (ORIF) PROXIMAL PHALANX Left 06/30/2012   Procedure: OPEN REDUCTION INTERNAL FIXATION (ORIF) LEFT INDEX FINGER PROXIMAL PHALANX FRACTURE WITH LIGAMENT REPAIR AS NECESSARY;  Surgeon: [name], MD;  Location: [facility];  Service: Orthopedics;  Laterality: Left;  . UMBILICAL HERNIA REPAIR  2015   "w/gallbladder OR"    Social History   Socioeconomic History  . Marital status: Married    Spouse name: Estill Bamberg  . Number of children: 2  . Years of education: Not on file  . Highest education level: High school graduate  Occupational History  . Occupation: Dealer  Social Needs  . Financial resource strain: Not on file  . Food insecurity:    Worry: Not on file    Inability: Not on file  . Transportation needs:    Medical: Not on file    Non-medical: Not on file  Tobacco Use  . Smoking status: Never Smoker  . Smokeless tobacco: Former Systems developer    Types: Snuff, Chew  Substance and Sexual Activity  . Alcohol use: Yes    Alcohol/week: 14.0 standard drinks    Types: 14 Cans of beer per week  . Drug use: No  . Sexual  activity: Yes  Lifestyle  . Physical activity:    Days per week: Not on file    Minutes per session: Not on file  . Stress: Not on file  Relationships  . Social connections:    Talks on phone: Not on file    Gets together: Not on file    Attends religious service: Not on file    Active member of club or organization: Not on file    Attends meetings of clubs or organizations: Not on file    Relationship status: Not on file  . Intimate partner violence:    Fear of current or ex partner: Not on file    Emotionally abused: Not on file    Physically abused: Not on file    Forced sexual activity: Not on  file  Other Topics Concern  . Not on file  Social History Narrative   Pt is a right handed married man, lives with his wife and 2 children ages 38 and 50, in a single story house. Pt drinks 6-8, 16oz diet sodas a day.     Current Outpatient Medications on File Prior to Visit  Medication Sig Dispense Refill  . aspirin 81 MG chewable tablet Chew 1 tablet (81 mg total) by mouth daily. 30 tablet 0  . BAYER MICROLET LANCETS lancets Use as instructed to check 3 times daily. 100 each 12  . Blood Glucose Monitoring Suppl (ONE TOUCH ULTRA SYSTEM KIT) w/Device KIT 1 kit by Does not apply route once. Check blood sugar 3 times daily. Diagnosis Diabetes ICD-10 E11.8    . etodolac (LODINE) 400 MG tablet etodolac 400 mg tablet    . ezetimibe (ZETIA) 10 MG tablet Take 1 tablet (10 mg total) by mouth at bedtime. 90 tablet 1  . Glatiramer Acetate (COPAXONE) 40 MG/ML SOSY Inject 40 mg into the skin every Monday, Wednesday, and Friday. 12 Syringe 3  . glucagon (GLUCAGON EMERGENCY) 1 MG injection Inject 1 mg into the muscle once as needed. (Patient taking differently: Inject 1 mg into the muscle daily as needed (hypoglycemia). ) 1 each 11  . glucose blood (BAYER CONTOUR NEXT TEST) test strip 1 each by Other route 4 (four) times daily. And lancets 4/day 400 each 3  . HYDROcodone-acetaminophen (NORCO/VICODIN) 5-325 MG tablet hydrocodone 5 mg-acetaminophen 325 mg tablet    . ibuprofen (ADVIL,MOTRIN) 800 MG tablet Take 1 tablet (800 mg total) by mouth 3 (three) times daily. 21 tablet 0  . insulin lispro (HUMALOG) 100 UNIT/ML injection Use 60 units a day via insulin pump. 3 vial 11  . losartan-hydrochlorothiazide (HYZAAR) 50-12.5 MG tablet 1/2 tablet twice daily 90 tablet 1  . montelukast (SINGULAIR) 10 MG tablet Take 1 tablet (10 mg total) by mouth at bedtime. 30 tablet 3  . ondansetron (ZOFRAN) 4 MG tablet Take 1 tablet (4 mg total) by mouth every 6 (six) hours as needed for nausea or vomiting. 30 tablet 1  .  rosuvastatin (CRESTOR) 10 MG tablet rosuvastatin 10 mg tablet    . Vitamin D, Ergocalciferol, (DRISDOL) 50000 units CAPS capsule 1 tab every Wednesday and Sunday 24 capsule 10   No current facility-administered medications on file prior to visit.     Allergies  Allergen Reactions  . Influenza Vac Split Quad Nausea And Vomiting  . Hydrochlorothiazide Other (See Comments)    Dizziness  . Lisinopril Cough  . Losartan Other (See Comments)    Dizziness    Family History  Problem Relation Age of Onset  .  Other Mother   . Cancer Mother        Breast / Bone  . Heart attack Father   . Hypertension Father   . Hyperlipidemia Father   . Diabetes Unknown   . Alcohol abuse Sister   . Diabetes Maternal Grandfather   . Stroke Paternal Grandmother   . Alcohol abuse Paternal Grandfather     BP (!) 136/92 (BP Location: Left Arm, Patient Position: Sitting, Cuff Size: Large)   Pulse 91   Ht 6' (1.829 m)   Wt (!) 312 lb (141.5 kg)   SpO2 98%   BMI 42.31 kg/m    Review of Systems He has gained weight    Objective:   Physical Exam GENERAL: no distress Pulses: right foot pulses are intact.   MSK: no deformity of the right foot or ankle.   CV: 1+ edema of the right leg. Skin:  no ulcer on the right foot or ankle.  normal temp on the feet and ankles.  There is spotty hyperpigmentation of the right foot.   Neuro: sensation is intact to touch on the feet and ankles, but severely decreased from normal.  Ext: left BKA  Right ankle and foot deformities are noted.    Lab Results  Component Value Date   HGBA1C 7.4 (H) 08/08/2017       Assessment & Plan:  Type 1 DM, with PAD.  He needs increased rx. Obesity: worse.  Pt and Dr Raliegh Scarlet request this be considered in rx plan.  Patient Instructions  Please take these pump settings:  basal rate of 0.4 units/hr, 24 HRS per day (when not in auto mode) mealtime bolus of 1 unit/12 grams carbohydrate.  correction bolus (which some people call  "sensitivity," or "insulin sensitivity ratio," or just "isr") of 1 unit for each 50 by which your glucose exceeds 120.  I have sent a prescription to your pharmacy, to add "Ozempic." Please come back for a follow-up appointment in 2 months.

## 2017-09-20 ENCOUNTER — Ambulatory Visit (INDEPENDENT_AMBULATORY_CARE_PROVIDER_SITE_OTHER): Payer: BLUE CROSS/BLUE SHIELD | Admitting: Adult Health

## 2017-09-20 ENCOUNTER — Ambulatory Visit: Payer: BLUE CROSS/BLUE SHIELD | Admitting: Family Medicine

## 2017-09-20 ENCOUNTER — Encounter: Payer: Self-pay | Admitting: Adult Health

## 2017-09-20 VITALS — BP 130/84 | HR 121 | Temp 98.9°F | Ht 72.0 in | Wt 307.7 lb

## 2017-09-20 DIAGNOSIS — K219 Gastro-esophageal reflux disease without esophagitis: Secondary | ICD-10-CM

## 2017-09-20 DIAGNOSIS — R1111 Vomiting without nausea: Secondary | ICD-10-CM | POA: Diagnosis not present

## 2017-09-20 DIAGNOSIS — R0789 Other chest pain: Secondary | ICD-10-CM | POA: Insufficient documentation

## 2017-09-20 MED ORDER — CIPROFLOXACIN HCL 750 MG PO TABS: 750.0000 mg | ORAL_TABLET | Freq: Every day | ORAL | 0 refills | Status: DC

## 2017-09-20 MED ORDER — OMEPRAZOLE 40 MG PO CPDR: 40.0000 mg | DELAYED_RELEASE_CAPSULE | Freq: Every day | ORAL | 3 refills | Status: DC

## 2017-09-20 MED ORDER — ONDANSETRON 8 MG PO TBDP: 8.0000 mg | ORAL_TABLET | Freq: Three times a day (TID) | ORAL | 0 refills | Status: DC | PRN

## 2017-09-20 NOTE — Patient Instructions (Signed)
Bland Diet A bland diet consists of foods that do not have a lot of fat or fiber. Foods without fat or fiber are easier for the body to digest. They are also less likely to irritate your mouth, throat, stomach, and other parts of your gastrointestinal tract. A bland diet is sometimes called a BRAT diet. What is my plan? Your health care provider or dietitian may recommend specific changes to your diet to prevent and treat your symptoms, such as:  Eating small meals often.  Cooking food until it is soft enough to chew easily.  Chewing your food well.  Drinking fluids slowly.  Not eating foods that are very spicy, sour, or fatty.  Not eating citrus fruits, such as oranges and grapefruit.  What do I need to know about this diet?  Eat a variety of foods from the bland diet food list.  Do not follow a bland diet longer than you have to.  Ask your health care provider whether you should take vitamins. What foods can I eat? Grains  Hot cereals, such as cream of wheat. Bread, crackers, or tortillas made from refined white flour. Rice. Vegetables Canned or cooked vegetables. Mashed or boiled potatoes. Fruits Bananas. Applesauce. Other types of cooked or canned fruit with the skin and seeds removed, such as canned peaches or pears. Meats and Other Protein Sources Scrambled eggs. Creamy peanut butter or other nut butters. Lean, well-cooked meats, such as chicken or fish. Tofu. Soups or broths. Dairy Low-fat dairy products, such as milk, cottage cheese, or yogurt. Beverages Water. Herbal tea. Apple juice. Sweets and Desserts Pudding. Custard. Fruit gelatin. Ice cream. Fats and Oils Mild salad dressings. Canola or olive oil. The items listed above may not be a complete list of allowed foods or beverages. Contact your dietitian for more options. What foods are not recommended? Foods and ingredients that are often not recommended include:  Spicy foods, such as hot sauce or  salsa.  Fried foods.  Sour foods, such as pickled or fermented foods.  Raw vegetables or fruits, especially citrus or berries.  Caffeinated drinks.  Alcohol.  Strongly flavored seasonings or condiments.  The items listed above may not be a complete list of foods and beverages that are not allowed. Contact your dietitian for more information. This information is not intended to replace advice given to you by your health care provider. Make sure you discuss any questions you have with your health care provider. Document Released: 04/20/2015 Document Revised: 06/04/2015 Document Reviewed: 01/08/2014 Elsevier Interactive Patient Education  2018 [employer].   Vomiting, Adult Vomiting occurs when stomach contents are thrown up and out of the mouth. Many people notice nausea before vomiting. Vomiting can make you feel weak and dehydrated. Dehydration can make you tired and thirsty, cause you to have a dry mouth, and decrease how often you urinate. Older adults and people who have other diseases or a weak immune system are at higher risk for dehydration.It is important to treat vomiting as told by your health care provider. Follow these instructions at home: Follow your health care provider's instructions about how to care for yourself at home. Eating and drinking Follow these recommendations as told by your health care provider:  Take an oral rehydration solution (ORS). This is a drink that is sold at pharmacies and retail stores.  Eat bland, easy-to-digest foods in small amounts as you are able. These foods include bananas, applesauce, rice, lean meats, toast, and crackers.  Drink clear fluids in small  amounts as you are able. Clear fluids include water, ice chips, low-calorie sports drinks, and fruit juice that has water added (diluted fruit juice).  Avoid fluids that contain a lot of sugar or caffeine.  Avoid alcohol and foods that are spicy or fatty.  General  instructions   Wash your hands frequently with soap and water. If soap and water are not available, use hand sanitizer. Make sure that everyone in your household washes their hands frequently.  Take over-the-counter and prescription medicines only as told by your health care provider.  Watch your condition for any changes.  Keep all follow-up visits as told by your health care provider. This is important. Contact a health care provider if:  You have a fever.  You are not able to keep fluids down.  Your vomiting gets worse.  You have new symptoms.  You feel light-headed or dizzy.  You have a headache.  You have muscle cramps. Get help right away if:  You have pain in your chest, neck, arm, or jaw.  You feel extremely weak or you faint.  You have persistent vomiting.  You have vomit that is bright red or looks like black coffee grounds.  You have stools that are bloody or black, or stools that look like tar.  You have severe pain, cramping, or bloating in your abdomen.  You have a severe headache, a stiff neck, or both.  You have a rash.  You have trouble breathing or you are breathing very quickly.  Your heart is beating very quickly.  Your skin feels cold and clammy.  You feel confused.  You have pain while urinating.  You have signs of dehydration, such as: ? Dark urine, or very little or no urine. ? Cracked lips. ? Dry mouth. ? Sunken eyes. ? Sleepiness. ? Weakness. These symptoms may represent a serious problem that is an emergency. Do not wait to see if the symptoms will go away. Get medical help right away. Call your local emergency services (911 in the [place] Do not drive yourself to the hospital. This information is not intended to replace advice given to you by your health care provider. Make sure you discuss any questions you have with your health care provider. Document Released: 01/23/2015 Document Revised: 06/04/2015 Document Reviewed:  09/02/2014 Elsevier Interactive Patient Education  2018 [employer].   Diarrhea, Adult Diarrhea is frequent loose and watery bowel movements. Diarrhea can make you feel weak and cause you to become dehydrated. Dehydration can make you tired and thirsty, cause you to have a dry mouth, and decrease how often you urinate. Diarrhea typically lasts 2-3 days. However, it can last longer if it is a sign of something more serious. It is important to treat your diarrhea as told by your health care provider. Follow these instructions at home: Eating and drinking  Follow these recommendations as told by your health care provider:  Take an oral rehydration solution (ORS). This is a drink that is sold at pharmacies and retail stores.  Drink clear fluids, such as water, ice chips, diluted fruit juice, and low-calorie sports drinks.  Eat bland, easy-to-digest foods in small amounts as you are able. These foods include bananas, applesauce, rice, lean meats, toast, and crackers.  Avoid drinking fluids that contain a lot of sugar or caffeine, such as energy drinks, sports drinks, and soda.  Avoid alcohol.  Avoid spicy or fatty foods.  General instructions  Drink enough fluid to keep your urine clear or pale yellow.  Wash your hands often. If soap and water are not available, use hand sanitizer.  Make sure that all people in your household wash their hands well and often.  Take over-the-counter and prescription medicines only as told by your health care provider.  Rest at home while you recover.  Watch your condition for any changes.  Take a warm bath to relieve any burning or pain from frequent diarrhea episodes.  Keep all follow-up visits as told by your health care provider. This is important. Contact a health care provider if:  You have a fever.  Your diarrhea gets worse.  You have new symptoms.  You cannot keep fluids down.  You feel light-headed or dizzy.  You have a  headache  You have muscle cramps. Get help right away if:  You have chest pain.  You feel extremely weak or you faint.  You have bloody or black stools or stools that look like tar.  You have severe pain, cramping, or bloating in your abdomen.  You have trouble breathing or you are breathing very quickly.  Your heart is beating very quickly.  Your skin feels cold and clammy.  You feel confused.  You have signs of dehydration, such as: ? Dark urine, very little urine, or no urine. ? Cracked lips. ? Dry mouth. ? Sunken eyes. ? Sleepiness. ? Weakness. This information is not intended to replace advice given to you by your health care provider. Make sure you discuss any questions you have with your health care provider. Document Released: 12/17/2001 Document Revised: 05/07/2015 Document Reviewed: 09/02/2014 Elsevier Interactive Patient Education  2018 [employer].  Please take all medications as directed- One dose Ciprofloxacin 750mg  for diarrhea Ondansetron 8mg  every 8 hrs as needed for nause/vomiting. Omeprazole 40mg  once daily for acid reflux, recommend you be seen Gastroenterologist since your acid reflux has worsened for significantly. You need to drastically increase water intake, strive for at least >75 oz/day. Follow BRAT diet. If symptoms persist for >1 week, please call clinic. FEEL BETTER!

## 2017-09-20 NOTE — Progress Notes (Signed)
Subjective:    Patient ID: Michael Hutchinson, male    DOB: 06-14-1975, 42 y.o.   MRN: [id number]  HPI:  Mr. Monje presents with N/V/D for the last 48 hrs. He estimates 4-5 loose stools yesterday and 3-4 today. He estimates 6-8 emesis yesterday and 3-4 today. He denies hematuria/hematochezia. He reports diffuse abdominal pain He also reports sig "burning up" the esophagus.  He has hx of GERD and is not on H2 blocker or PPI He reports GERD sx's will worsen with activity/exertion. He denies fever/night sweats He denies recent travel outside [place], eating anywhere unusual, or camping. He denies anyone else in home with similar sx's He denies ABX use in last 90 days He reports BS have been running 140-220s, denies episodes of hypoglycemia  Daughter at [facility] during [place] and she reports her father appears pale in complexion  Patient Care Team    Relationship Specialty Notifications Start End  Mellody Dance, DO PCP - General Family Medicine All results, Admissions 12/31/15   Fayette Pho, PA Attending Physician Orthopedic Surgery  06/01/16   [name], MD Consulting Physician Orthopedic Surgery  07/04/16   Campbell Riches, MD Consulting Physician Infectious Diseases  07/04/16   Pieter Partridge, [place] Physician Neurology  05/29/17   Renato Shin, MD Consulting Physician Endocrinology  05/29/17    Comment: Treatment of diabetes  Philemon Kingdom, MD Consulting Physician Internal Medicine  07/11/17    Comment: Endocrinologist    Patient Active Problem List   Diagnosis Date Noted  . Burning in the chest 09/20/2017  . Statin intolerance 08/08/2017  . Multiple sclerosis ([place]) 07/11/2017  . Low hemoglobin 07/11/2017  . Lateral epicondylitis of right elbow 03/20/2017  . Pain in joint of right elbow 03/20/2017  . Chronic fatigue 07/04/2016  . Impaired exercise tolerance 07/04/2016  . Acute left otitis media 06/09/2016  . Hx of BKA, left - with subsequent cellulitis and treatment for  osteomylitis 06/01/2016  . Abscess of left leg 04/26/2016  . Diabetes mellitus with complication ([place])   . Normocytic anemia 01/01/2016  . Osteomyelitis ([place]) 12/31/2015  . Diabetic ulcer of left heel associated with type 1 diabetes mellitus, with necrosis of bone ([place]) 12/31/2015  . Ankle wound 12/31/2015  . Chronic osteomyelitis of left ankle with draining sinus ([place])   . Type 1 diabetes, uncontrolled, with Charcot's joint of foot ([place]) 10/12/2015  . Low HDL (under 40) 09/22/2015  . Obesity, Class III, BMI 40-49.9 (morbid obesity) ([place]) 09/22/2015  . H/O noncompliance with medical treatment, presenting hazards to health 09/22/2015  . Hx MRSA infection 09/22/2015  . Vitamin D deficiency 09/22/2015  . Diabetes mellitus type 1 ([place])   . Charcot's joint of foot 11/25/2013  . Hypertension associated with diabetes ([place]) 05/03/2013  . Mixed diabetic hyperlipidemia associated with type 1 diabetes mellitus ([place]) 07/26/2012  . Diabetic ketoacidosis ([place])   . GERD (gastroesophageal reflux disease)   . Meniscus tear   . Epigastric pain 12/10/2011  . Vomiting 12/10/2011  . Problems influencing health status 11/21/2011     Past Medical History:  Diagnosis Date  . Charcot's joint of foot 11/25/2013  . Complication of anesthesia    "I wake up angry" (12/31/2015)  . Diabetes mellitus type 1 ([place]) dx'd 1981  . Diabetic ketoacidosis ([place])   . Essential hypertension 05/03/2013  . GERD (gastroesophageal reflux disease)   . High cholesterol   . Hx MRSA infection    Inner thigh and under arm- healed areas  .  Meniscus tear   . Shortness of breath    with exertion only     Past Surgical History:  Procedure Laterality Date  . AMPUTATION Left 01/01/2016   Procedure: AMPUTATION BELOW KNEE;  Surgeon: [name], MD;  Location: [facility];  Service: Orthopedics;  Laterality: Left;  . APPLICATION OF WOUND VAC  04/26/2016  . FRACTURE SURGERY    . HERNIA REPAIR    . I&D EXTREMITY Left 04/26/2016    Procedure: IRRIGATION AND DEBRIDEMENT EXTREMITY/Left Leg/Possible Wound Vac;  Surgeon: [name], MD;  Location: [facility];  Service: Orthopedics;  Laterality: Left;  . INCISE AND DRAIN ABCESS Left 04/26/2016  . KNEE ARTHROSCOPY Left ~ 2010  . LAPAROSCOPIC CHOLECYSTECTOMY  2015  . METACARPOPHALANGEAL JOINT ARTHRODESIS Left 06/2012   Fracture left index finger intra-articular MCP joint/notes 06/30/2012  . OPEN REDUCTION INTERNAL FIXATION (ORIF) PROXIMAL PHALANX Left 06/30/2012   Procedure: OPEN REDUCTION INTERNAL FIXATION (ORIF) LEFT INDEX FINGER PROXIMAL PHALANX FRACTURE WITH LIGAMENT REPAIR AS NECESSARY;  Surgeon: [name], MD;  Location: [facility];  Service: Orthopedics;  Laterality: Left;  . UMBILICAL HERNIA REPAIR  2015   "w/gallbladder OR"     Family History  Problem Relation Age of Onset  . Other Mother   . Cancer Mother        Breast / Bone  . Heart attack Father   . Hypertension Father   . Hyperlipidemia Father   . Diabetes Unknown   . Alcohol abuse Sister   . Diabetes Maternal Grandfather   . Stroke Paternal Grandmother   . Alcohol abuse Paternal Grandfather      Social History   Substance and Sexual Activity  Drug Use No     Social History   Substance and Sexual Activity  Alcohol Use Yes  . Alcohol/week: 14.0 standard drinks  . Types: 14 Cans of beer per week     Social History   Tobacco Use  Smoking Status Never Smoker  Smokeless Tobacco Former Systems developer  . Types: Snuff, Chew     Outpatient Encounter Medications as of 09/20/2017  Medication Sig  . aspirin 81 MG chewable tablet Chew 1 tablet (81 mg total) by mouth daily.  Marland Kitchen BAYER MICROLET LANCETS lancets Use as instructed to check 3 times daily.  . Blood Glucose Monitoring Suppl (ONE TOUCH ULTRA SYSTEM KIT) w/Device KIT 1 kit by Does not apply route once. Check blood sugar 3 times daily. Diagnosis Diabetes ICD-10 E11.8  . etodolac (LODINE) 400 MG tablet etodolac 400 mg tablet  . ezetimibe (ZETIA) 10 MG  tablet Take 1 tablet (10 mg total) by mouth at bedtime.  . Glatiramer Acetate (COPAXONE) 40 MG/ML SOSY Inject 40 mg into the skin every Monday, Wednesday, and Friday.  Marland Kitchen glucagon (GLUCAGON EMERGENCY) 1 MG injection Inject 1 mg into the muscle once as needed. (Patient taking differently: Inject 1 mg into the muscle daily as needed (hypoglycemia). )  . glucose blood (BAYER CONTOUR NEXT TEST) test strip 1 each by Other route 4 (four) times daily. And lancets 4/day  . HYDROcodone-acetaminophen (NORCO/VICODIN) 5-325 MG tablet hydrocodone 5 mg-acetaminophen 325 mg tablet  . ibuprofen (ADVIL,MOTRIN) 800 MG tablet Take 1 tablet (800 mg total) by mouth 3 (three) times daily.  . insulin lispro (HUMALOG) 100 UNIT/ML injection Use 60 units a day via insulin pump.  Marland Kitchen losartan-hydrochlorothiazide (HYZAAR) 50-12.5 MG tablet 1/2 tablet twice daily  . montelukast (SINGULAIR) 10 MG tablet Take 1 tablet (10 mg total) by mouth at bedtime.  . ondansetron (  ZOFRAN) 4 MG tablet Take 1 tablet (4 mg total) by mouth every 6 (six) hours as needed for nausea or vomiting.  . rosuvastatin (CRESTOR) 10 MG tablet rosuvastatin 10 mg tablet  . Semaglutide (OZEMPIC) 0.25 or 0.5 MG/DOSE SOPN Inject 0.5 mg into the skin once a week.  . Vitamin D, Ergocalciferol, (DRISDOL) 50000 units CAPS capsule 1 tab every Wednesday and Sunday  . ciprofloxacin (CIPRO) 750 MG tablet Take 1 tablet (750 mg total) by mouth daily.  Marland Kitchen omeprazole (PRILOSEC) 40 MG capsule Take 1 capsule (40 mg total) by mouth daily.  . ondansetron (ZOFRAN-ODT) 8 MG disintegrating tablet Take 1 tablet (8 mg total) by mouth every 8 (eight) hours as needed for nausea or vomiting.   No facility-administered encounter medications on file as of 09/20/2017.     Allergies: Influenza virus vaccine split; Hydrochlorothiazide; Lisinopril; and Losartan  Body mass index is 41.73 kg/m.  Blood pressure 130/84, pulse (!) 121, temperature 98.9 F (37.2 C), height 6' (1.829 m), weight  (!) 307 lb 11.2 oz (139.6 kg), SpO2 99 %.  Review of Systems  Constitutional: Positive for activity change, appetite change and fatigue. Negative for chills, diaphoresis, fever and unexpected weight change.  Respiratory: Positive for chest tightness. Negative for cough, shortness of breath, wheezing and stridor.   Cardiovascular: Negative for chest pain, palpitations and leg swelling.  Gastrointestinal: Positive for abdominal pain, diarrhea, nausea and vomiting. Negative for abdominal distention, blood in stool and constipation.       Sig GERD  Genitourinary: Negative for difficulty urinating, flank pain and frequency.  Skin: Negative for color change, pallor, rash and wound.  Neurological: Negative for dizziness and headaches.  Hematological: Does not bruise/bleed easily.  Psychiatric/Behavioral: Positive for sleep disturbance.       Objective:   Physical Exam  Constitutional: He is oriented to person, place, and time. He appears well-developed and well-nourished. He has a sickly appearance. He appears ill. No distress.  HENT:  Head: Normocephalic and atraumatic.  Right Ear: External ear normal.  Left Ear: External ear normal.  Nose: Nose normal.  Mouth/Throat: Oropharynx is clear and moist.  Cardiovascular: Regular rhythm, normal heart sounds and intact distal pulses. Tachycardia present.  No murmur heard. Pulmonary/Chest: Effort normal and breath sounds normal. No stridor. No respiratory distress. He has no wheezes. He has no rales. He exhibits no tenderness.  Abdominal: Soft. Bowel sounds are normal. He exhibits no distension and no mass. There is generalized tenderness. There is no rebound, no guarding, no CVA tenderness, no tenderness at McBurney's point and negative Murphy's sign. No hernia.  Neurological: He is alert and oriented to person, place, and time.  Skin: Skin is warm and dry. Capillary refill takes less than 2 seconds. No rash noted. He is not diaphoretic. No erythema.  There is pallor.  Psychiatric: He has a normal mood and affect. His behavior is normal. Judgment and thought content normal.  Nursing note and vitals reviewed.     Assessment & Plan:   1. Vomiting without nausea, intractability of vomiting not specified, unspecified vomiting type   2. Burning in the chest   3. Gastroesophageal reflux disease, esophagitis presence not specified     Burning in the chest EKG-ST with possible L atrial enlargement   Vomiting One dose Ciprofloxacin 789m for diarrhea Ondansetron 831mevery 8 hrs as needed for nause/vomiting. Omeprazole 4051mnce daily for acid reflux, recommend you be seen Gastroenterologist since your acid reflux has worsened for significantly. You need to  drastically increase water intake, strive for at least >75 oz/day. Follow BRAT diet. If symptoms persist for >1 week, please call clinic.  GERD (gastroesophageal reflux disease) Omeprazole 34m QD Referral to GI placed  Pt was in the office today for 45+ minutes, I spent >75% of time in face to face counseling of various medical concerns and in coordination of care  FOLLOW-UP:  Return if symptoms worsen or fail to improve.

## 2017-09-20 NOTE — Assessment & Plan Note (Signed)
Omeprazole 40mg  QD Referral to GI placed

## 2017-09-20 NOTE — Assessment & Plan Note (Signed)
EKG-ST with possible L atrial enlargement

## 2017-09-20 NOTE — Assessment & Plan Note (Signed)
One dose Ciprofloxacin 750mg  for diarrhea Ondansetron 8mg  every 8 hrs as needed for nause/vomiting. Omeprazole 40mg  once daily for acid reflux, recommend you be seen Gastroenterologist since your acid reflux has worsened for significantly. You need to drastically increase water intake, strive for at least >75 oz/day. Follow BRAT diet. If symptoms persist for >1 week, please call clinic.

## 2017-09-21 ENCOUNTER — Encounter: Payer: Self-pay | Admitting: Gastroenterology

## 2017-09-21 LAB — COMPREHENSIVE METABOLIC PANEL
ALK PHOS: 71 IU/L (ref 39–117)
ALT: 17 IU/L (ref 0–44)
AST: 13 IU/L (ref 0–40)
Albumin/Globulin Ratio: 1.8 (ref 1.2–2.2)
Albumin: 4.6 g/dL (ref 3.5–5.5)
BUN/Creatinine Ratio: 13 (ref 9–20)
BUN: 15 mg/dL (ref 6–24)
Bilirubin Total: 0.5 mg/dL (ref 0.0–1.2)
CALCIUM: 9.1 mg/dL (ref 8.7–10.2)
CO2: 22 mmol/L (ref 20–29)
Chloride: 99 mmol/L (ref 96–106)
Creatinine, Ser: 1.18 mg/dL (ref 0.76–1.27)
GFR calc Af Amer: 89 mL/min/{1.73_m2} (ref >59)
GFR, EST NON AFRICAN AMERICAN: 77 mL/min/{1.73_m2} (ref >59)
GLUCOSE: 149 mg/dL — AB (ref 65–99)
Globulin, Total: 2.5 g/dL (ref 1.5–4.5)
Potassium: 4.1 mmol/L (ref 3.5–5.2)
Sodium: 136 mmol/L (ref 134–144)
Total Protein: 7.1 g/dL (ref 6.0–8.5)

## 2017-09-21 LAB — CBC WITH DIFFERENTIAL/PLATELET
BASOS ABS: 0 10*3/uL (ref 0.0–0.2)
Basos: 0 %
EOS (ABSOLUTE): 0.2 10*3/uL (ref 0.0–0.4)
EOS: 2 %
Hematocrit: 39.9 % (ref 37.5–51.0)
Hemoglobin: 13.8 g/dL (ref 13.0–17.7)
IMMATURE GRANULOCYTES: 1 %
Immature Grans (Abs): 0.1 10*3/uL (ref 0.0–0.1)
Lymphocytes Absolute: 0.7 10*3/uL (ref 0.7–3.1)
Lymphs: 8 %
MCH: 29.6 pg (ref 26.6–33.0)
MCHC: 34.6 g/dL (ref 31.5–35.7)
MCV: 86 fL (ref 79–97)
MONOS ABS: 0.8 10*3/uL (ref 0.1–0.9)
Monocytes: 9 %
Neutrophils Absolute: 7.5 10*3/uL — ABNORMAL HIGH (ref 1.4–7.0)
Neutrophils: 80 %
PLATELETS: 305 10*3/uL (ref 150–450)
RBC: 4.66 x10E6/uL (ref 4.14–5.80)
RDW: 11.8 % — AB (ref 12.3–15.4)
WBC: 9.3 10*3/uL (ref 3.4–10.8)

## 2017-09-25 DIAGNOSIS — M7711 Lateral epicondylitis, right elbow: Secondary | ICD-10-CM | POA: Diagnosis not present

## 2017-09-25 DIAGNOSIS — M25521 Pain in right elbow: Secondary | ICD-10-CM | POA: Diagnosis not present

## 2017-09-26 ENCOUNTER — Telehealth: Payer: Self-pay | Admitting: Neurology

## 2017-09-26 MED ORDER — GLATIRAMER ACETATE 40 MG/ML ~~LOC~~ SOSY: 40.0000 mg | PREFILLED_SYRINGE | SUBCUTANEOUS | 11 refills | Status: DC

## 2017-09-26 NOTE — Telephone Encounter (Signed)
[facility] calling regarding this patient and his medication Glatiramer. They are needing a New Prescription faxed into [phone]. Thanks

## 2017-09-27 ENCOUNTER — Encounter: Payer: BLUE CROSS/BLUE SHIELD | Attending: Endocrinology | Admitting: Nutrition

## 2017-09-27 DIAGNOSIS — E10610 Type 1 diabetes mellitus with diabetic neuropathic arthropathy: Secondary | ICD-10-CM | POA: Insufficient documentation

## 2017-09-27 DIAGNOSIS — Z713 Dietary counseling and surveillance: Secondary | ICD-10-CM | POA: Insufficient documentation

## 2017-09-27 DIAGNOSIS — E1065 Type 1 diabetes mellitus with hyperglycemia: Secondary | ICD-10-CM | POA: Insufficient documentation

## 2017-09-27 NOTE — Progress Notes (Signed)
Review of previous 48 hour diet/meals, it appears his meals are balanced and he is counting carbs correctly.  Patient is bolusing fore all meals and snacks.  Weight is up 7 pounds  Discussed ways he can reduce calories for weight loss--smaller portion sizes of meats--limiting to 4 ounces at supper and 4 at lunch.  Also eating less fried foods.  He snacks rarely between meals.  [employer] download shows last 7 days all readings are in target range except for a bent canulla that caused high readings for 30 hours.  We reviewed high blood sugar protocol, and he reported good understanding of this with no final questions.

## 2017-09-27 NOTE — Patient Instructions (Signed)
When blood sugar is high, do one correction bolus.  If blood sugar does not come down, change infusion set.

## 2017-09-29 ENCOUNTER — Telehealth: Payer: Self-pay | Admitting: Endocrinology

## 2017-09-29 NOTE — Telephone Encounter (Signed)
Patient stated he has been on a pump for awhile now and it has been successful but he would like to change from the pump he has now,  Patient would like a call back to discuss him changing.   please advise

## 2017-10-02 ENCOUNTER — Telehealth: Payer: Self-pay | Admitting: Family Medicine

## 2017-10-02 NOTE — Telephone Encounter (Signed)
Patient was seen by [facility] for acute visit on 09/20/17 and came in the office with nausea. He was prescribed med to help out with this. He states that since taking med he has been very constipated and is wondering if there is something that can be called into [facility] to help with this. Please advise.

## 2017-10-02 NOTE — Telephone Encounter (Signed)
Pt called back to follow up on the pump. Please advise?  Call pt @ [phone]. Thank you!

## 2017-10-03 NOTE — Telephone Encounter (Signed)
Called patient, he states that he has had small BM today that was hard.  Patient is pushing water and taking a stool softener.  Per Dr. Steffanie Rainwater advise advised patient to drink at least 1/2 weight in ozs of water and to take OTC Miralax.  Patient expressed understanding and will try this and will follow up with [place] if this does not help or any additional symptoms start. MPulliam, CMA/RT(R)

## 2017-10-03 NOTE — Telephone Encounter (Signed)
Patient called and said he wants to send this pump back and get a different pump.  [place] Spaniel he called his insurance and they said he can get a new one.  He was given [facility] phone number to call to get this done.

## 2017-10-05 ENCOUNTER — Ambulatory Visit: Payer: BLUE CROSS/BLUE SHIELD | Admitting: Endocrinology

## 2017-10-09 ENCOUNTER — Ambulatory Visit (INDEPENDENT_AMBULATORY_CARE_PROVIDER_SITE_OTHER): Payer: BLUE CROSS/BLUE SHIELD | Admitting: Family Medicine

## 2017-10-09 ENCOUNTER — Encounter: Payer: Self-pay | Admitting: Family Medicine

## 2017-10-09 VITALS — BP 134/89 | HR 103 | Ht 72.0 in | Wt 311.5 lb

## 2017-10-09 DIAGNOSIS — E1069 Type 1 diabetes mellitus with other specified complication: Secondary | ICD-10-CM

## 2017-10-09 DIAGNOSIS — E782 Mixed hyperlipidemia: Secondary | ICD-10-CM

## 2017-10-09 NOTE — Progress Notes (Signed)
Impression and Recommendations:    1. Obesity, Class III, BMI 40-49.9 (morbid obesity) ([place])   2. Mixed diabetic hyperlipidemia associated with type 1 diabetes mellitus (HCC)     Weight Loss -Pt has lost 21 lbs since July 2019 -Set goal for 8lbs of weight loss in 1 month -Pt to return for weight loss check in 4 weeks -Discussed negative impact of eating too little food on metabolism -Helped pt download LoseIt app in [place] -Educated pt on how to enter his current information and weight loss goals -Encouraged pt to continue making positive dietary choices to keep losing weight -Showed pt how to search, scan and input food choices to see calorie intake -Instructed pt not to eat additional calories that are added with exercise in order to make goals -Encouraged pt to find support for his lifestyle changes such as including his wife and friends -Discussed ways pt usually add more calories than they realize such as additional sauces that aren't documentd  HLD -Encouraged pt to begin taking medication as directed each day -Encouraged pt to continue making good dietary changes to help control cholesterol levels -Discussed importance of drinking water daily to prevent muscle aches associated with cholesterol medication   Education and routine counseling performed. Handouts provided.   Medications Discontinued During This Encounter  Medication Reason  . ondansetron (ZOFRAN) 4 MG tablet Change in therapy       The patient was counseled, risk factors were discussed, anticipatory guidance given.  Gross side effects, risk and benefits, and alternatives of medications discussed with patient.  Patient is aware that all medications have potential side effects and we are unable to predict every side effect or drug-drug interaction that may occur.  Expresses verbal understanding and consents to current therapy plan and treatment regimen.   Return for Exactly 4 weeks, weight loss.   Please  see AVS handed out to patient at the end of our visit for further patient instructions/ counseling done pertaining to today's office visit.    Note:  This document was prepared using Dragon voice recognition software and may include unintentional dictation errors.  This document serves as a record of services personally performed by Mellody Dance, MD. It was created on her behalf by Georga Bora, a trained medical scribe. The creation of this record is based on the scribe's personal observations and the provider's statements to them.   I have reviewed the above medical documentation for accuracy and completeness and I concur.  Mellody Dance, D.O.       Subjective:    Chief Complaint  Patient presents with  . Follow-up     Michael Hutchinson is a 42 y.o. male who presents to [place] [facility] today for Diabetes Management.     Weight Loss -Pt has lost 21 lbs since last appointment on 08/08/2017 -Pt came to discuss how to use the lose it app -States he has not been able to get the app to download onto his phone and has tried multiple times -Pt has been making better choices with portion control in order to lose the weight he lost  CHOL HPI:  -  He  is currently managed with zedia, which he has been taking every other day -States he wanted to slowly increase amount of zedia so his body could adjust  -Tested liver enzymes on 09/20/2017 -ALT 17  -AST 13  -Pt has been making dietary changes to support his cholesterol levels RUQ pain-  denies  Muscle aches-denies No other s-e  Last lipid panel as follows:  Lab Results  Component Value Date   CHOL 218 (H) 10/28/2016   HDL 43 10/28/2016   LDLCALC 147 (H) 10/28/2016   TRIG 140 10/28/2016   CHOLHDL 5.1 (H) 10/28/2016    Hepatic Function Latest Ref Rng & Units 09/20/2017 08/08/2017 05/19/2017  Total Protein 6.0 - 8.5 g/dL 7.1 6.7 -  Albumin 3.5 - 5.5 g/dL 4.6 4.2 3.7  AST 0 - 40 IU/L 13 46(H) -  ALT 0  - 44 IU/L 17 66(H) -  Alk Phosphatase 39 - 117 IU/L 71 75 -  Total Bilirubin 0.0 - 1.2 mg/dL 0.5 0.5 -  Bilirubin, Direct 0.0 - 0.3 mg/dL - - -        No problems updated.    Patient Care Team    Relationship Specialty Notifications Start End  Mellody Dance, DO PCP - General Family Medicine All results, Admissions 12/31/15   Fayette Pho, PA Attending Physician Orthopedic Surgery  06/01/16   [name], MD Consulting Physician Orthopedic Surgery  07/04/16   Campbell Riches, MD Consulting Physician Infectious Diseases  07/04/16   Pieter Partridge, DO Consulting Physician Neurology  05/29/17   Renato Shin, MD Consulting Physician Endocrinology  05/29/17    Comment: Treatment of diabetes  Philemon Kingdom, MD Consulting Physician Internal Medicine  07/11/17    Comment: Endocrinologist     Patient Active Problem List   Diagnosis Date Noted  . Multiple sclerosis ([place]) 07/11/2017    Priority: High  . Hx of BKA, left - with subsequent cellulitis and treatment for osteomylitis 06/01/2016    Priority: High  . Obesity, Class III, BMI 40-49.9 (morbid obesity) ([place]) 09/22/2015    Priority: High  . Diabetes mellitus type 1 (HCC)     Priority: High  . Hypertension associated with diabetes ([place]) 05/03/2013    Priority: High  . Mixed diabetic hyperlipidemia associated with type 1 diabetes mellitus ([place]) 07/26/2012    Priority: High  . Low HDL (under 40) 09/22/2015    Priority: Medium  . GERD (gastroesophageal reflux disease)     Priority: Medium  . Hx MRSA infection 09/22/2015    Priority: Low  . Burning in the chest 09/20/2017  . Statin intolerance 08/08/2017  . Low hemoglobin 07/11/2017  . Lateral epicondylitis of right elbow 03/20/2017  . Pain in joint of right elbow 03/20/2017  . Chronic fatigue 07/04/2016  . Impaired exercise tolerance 07/04/2016  . Acute left otitis media 06/09/2016  . Abscess of left leg 04/26/2016  . Diabetes mellitus with complication ([place])   .  Normocytic anemia 01/01/2016  . Osteomyelitis ([place]) 12/31/2015  . Diabetic ulcer of left heel associated with type 1 diabetes mellitus, with necrosis of bone ([place]) 12/31/2015  . Ankle wound 12/31/2015  . Chronic osteomyelitis of left ankle with draining sinus ([place])   . Type 1 diabetes, uncontrolled, with Charcot's joint of foot ([place]) 10/12/2015  . H/O noncompliance with medical treatment, presenting hazards to health 09/22/2015  . Vitamin D deficiency 09/22/2015  . Charcot's joint of foot 11/25/2013  . Diabetic ketoacidosis ([place])   . Meniscus tear   . Epigastric pain 12/10/2011  . Vomiting 12/10/2011  . Problems influencing health status 11/21/2011     Past Medical History:  Diagnosis Date  . Charcot's joint of foot 11/25/2013  . Complication of anesthesia    "I wake up angry" (12/31/2015)  . Diabetes mellitus type 1 ([place]) dx'd 1981  .  Diabetic ketoacidosis ([place])   . Essential hypertension 05/03/2013  . GERD (gastroesophageal reflux disease)   . High cholesterol   . Hx MRSA infection    Inner thigh and under arm- healed areas  . Meniscus tear   . Shortness of breath    with exertion only     Past Surgical History:  Procedure Laterality Date  . AMPUTATION Left 01/01/2016   Procedure: AMPUTATION BELOW KNEE;  Surgeon: [name], MD;  Location: [place];  Service: Orthopedics;  Laterality: Left;  . APPLICATION OF WOUND VAC  04/26/2016  . FRACTURE SURGERY    . HERNIA REPAIR    . I&D EXTREMITY Left 04/26/2016   Procedure: IRRIGATION AND DEBRIDEMENT EXTREMITY/Left Leg/Possible Wound Vac;  Surgeon: [name], MD;  Location: [place];  Service: Orthopedics;  Laterality: Left;  . INCISE AND DRAIN ABCESS Left 04/26/2016  . KNEE ARTHROSCOPY Left ~ 2010  . LAPAROSCOPIC CHOLECYSTECTOMY  2015  . METACARPOPHALANGEAL JOINT ARTHRODESIS Left 06/2012   Fracture left index finger intra-articular MCP joint/notes 06/30/2012  . OPEN REDUCTION INTERNAL FIXATION (ORIF) PROXIMAL PHALANX Left  06/30/2012   Procedure: OPEN REDUCTION INTERNAL FIXATION (ORIF) LEFT INDEX FINGER PROXIMAL PHALANX FRACTURE WITH LIGAMENT REPAIR AS NECESSARY;  Surgeon: [name], MD;  Location: [facility];  Service: Orthopedics;  Laterality: Left;  . UMBILICAL HERNIA REPAIR  2015   "w/gallbladder OR"     Family History  Problem Relation Age of Onset  . Other Mother   . Cancer Mother        Breast / Bone  . Heart attack Father   . Hypertension Father   . Hyperlipidemia Father   . Diabetes Unknown   . Alcohol abuse Sister   . Diabetes Maternal Grandfather   . Stroke Paternal Grandmother   . Alcohol abuse Paternal Grandfather      Social History   Substance and Sexual Activity  Drug Use No  ,  Social History   Substance and Sexual Activity  Alcohol Use Yes  . Alcohol/week: 14.0 standard drinks  . Types: 14 Cans of beer per week  ,  Social History   Tobacco Use  Smoking Status Never Smoker  Smokeless Tobacco Former Systems developer  . Types: Snuff, Chew  ,    Current Outpatient Medications on File Prior to Visit  Medication Sig Dispense Refill  . aspirin 81 MG chewable tablet Chew 1 tablet (81 mg total) by mouth daily. 30 tablet 0  . BAYER MICROLET LANCETS lancets Use as instructed to check 3 times daily. 100 each 12  . Blood Glucose Monitoring Suppl (ONE TOUCH ULTRA SYSTEM KIT) w/Device KIT 1 kit by Does not apply route once. Check blood sugar 3 times daily. Diagnosis Diabetes ICD-10 E11.8    . etodolac (LODINE) 400 MG tablet etodolac 400 mg tablet    . ezetimibe (ZETIA) 10 MG tablet Take 1 tablet (10 mg total) by mouth at bedtime. 90 tablet 1  . Glatiramer Acetate (COPAXONE) 40 MG/ML SOSY Inject 40 mg into the skin every Monday, Wednesday, and Friday. 12 Syringe 3  . Glatiramer Acetate 40 MG/ML SOSY Inject 40 mg into the skin as directed. Monday, Wednesday and Friday 12 Syringe 11  . glucagon (GLUCAGON EMERGENCY) 1 MG injection Inject 1 mg into the muscle once as needed. (Patient taking  differently: Inject 1 mg into the muscle daily as needed (hypoglycemia). ) 1 each 11  . glucose blood (BAYER CONTOUR NEXT TEST) test strip 1 each by Other route 4 (four) times daily.  And lancets 4/day 400 each 3  . HYDROcodone-acetaminophen (NORCO/VICODIN) 5-325 MG tablet hydrocodone 5 mg-acetaminophen 325 mg tablet    . ibuprofen (ADVIL,MOTRIN) 800 MG tablet Take 1 tablet (800 mg total) by mouth 3 (three) times daily. 21 tablet 0  . insulin lispro (HUMALOG) 100 UNIT/ML injection Use 60 units a day via insulin pump. 3 vial 11  . losartan-hydrochlorothiazide (HYZAAR) 50-12.5 MG tablet 1/2 tablet twice daily 90 tablet 1  . montelukast (SINGULAIR) 10 MG tablet Take 1 tablet (10 mg total) by mouth at bedtime. 30 tablet 3  . omeprazole (PRILOSEC) 40 MG capsule Take 1 capsule (40 mg total) by mouth daily. 30 capsule 3  . ondansetron (ZOFRAN-ODT) 8 MG disintegrating tablet Take 1 tablet (8 mg total) by mouth every 8 (eight) hours as needed for nausea or vomiting. 20 tablet 0  . rosuvastatin (CRESTOR) 10 MG tablet rosuvastatin 10 mg tablet    . Vitamin D, Ergocalciferol, (DRISDOL) 50000 units CAPS capsule 1 tab every Wednesday and Sunday 24 capsule 10   No current facility-administered medications on file prior to visit.      Allergies  Allergen Reactions  . Influenza Virus Vaccine Split Nausea And Vomiting  . Hydrochlorothiazide Other (See Comments)    Dizziness  . Lisinopril Cough  . Losartan Other (See Comments)    Dizziness     Review of Systems:   General:  Denies fever, chills Optho/Auditory:   Denies visual changes, blurred vision Respiratory:   Denies SOB, cough, wheeze, DIB  Cardiovascular:   Denies chest pain, palpitations, painful respirations Gastrointestinal:   Denies nausea, vomiting, diarrhea.  Endocrine:     Denies new hot or cold intolerance Musculoskeletal:  Denies joint swelling, gait issues, or new unexplained myalgias/ arthralgias Skin:  Denies rash, suspicious lesions   Neurological:    Denies dizziness, unexplained weakness, numbness  Psychiatric/Behavioral:   Denies mood changes    Objective:     Blood pressure 134/89, pulse (!) 103, height 6' (1.829 m), weight (!) 311 lb 8 oz (141.3 kg), SpO2 99 %.  Body mass index is 42.25 kg/m.  General: Well Developed, well nourished, and in no acute distress.  HEENT: Normocephalic, atraumatic, pupils equal round reactive to light, neck supple, No carotid bruits, no JVD Skin: Warm and dry, cap RF less 2 sec Cardiac: Regular rate and rhythm, S1, S2 WNL's, no murmurs rubs or gallops Respiratory: ECTA B/L, Not using accessory muscles, speaking in full sentences. NeuroM-Sk: Ambulates w/o assistance, moves ext * 4 w/o difficulty, sensation grossly intact.  Ext: scant edema b/l lower ext Psych: No HI/SI, judgement and insight good, Euthymic mood. Full Affect.

## 2017-10-09 NOTE — Patient Instructions (Addendum)
Your goal for next time is a pound weight loss in exactly 4 weeks.  This will be a quick little weigh-in office visit.  Great job on the 21 pound weight loss since I saw you on 08/08/2017!!!    Behavior Modification Ideas for Weight Management  Weight management involves adopting a healthy lifestyle that includes a knowledge of nutrition and exercise, a positive attitude and the right kind of motivation. Internal motives such as better health, increased energy, self-esteem and personal control increase your chances of lifelong weight management success.  Remember to have realistic goals and think long-term success. Believe in yourself and you can do it. The following information will give you ideas to help you meet your goals.  Control Your Home Environment  Eat only while sitting down at the kitchen or dining room table. Do not eat while watching television, reading, cooking, talking on the phone, standing at the refrigerator or working on the computer. Keep tempting foods out of the house - don't buy them. Keep tempting foods out of sight. Have low-calorie foods ready to eat. Unless you are preparing a meal, stay out of the kitchen. Have healthy snacks at your disposal, such as small pieces of fruit, vegetables, canned fruit, pretzels, low-fat string cheese and nonfat cottage cheese.  Control Your Work Environment  Do not eat at [employer] or keep tempting snacks at your desk. If you get hungry between meals, plan healthy snacks and bring them with you to work. During your breaks, go for a walk instead of eating. If you work around food, plan in advance the one item you will eat at mealtime. Make it inconvenient to nibble on food by chewing gum, sugarless candy or drinking water or another low-calorie beverage. Do not work through meals. Skipping meals slows down metabolism and may result in overeating at the next meal. If food is available for special occasions, either pick the healthiest  item, nibble on low-fat snacks brought from home, don't have anything offered, choose one option and have a small amount, or have only a beverage.  Control Your Mealtime Environment  Serve your plate of food at the stove or kitchen counter. Do not put the serving dishes on the table. If you do put dishes on the table, remove them immediately when finished eating. Fill half of your plate with vegetables, a quarter with lean protein and a quarter with starch. Use smaller plates, bowls and glasses. A smaller portion will look large when it is in a little dish. Politely refuse second helpings. When fixing your plate, limit portions of food to one scoop/serving or less.   Daily Food Management  Replace eating with another activity that you will not associate with food. Wait 20 minutes before eating something you are craving. Drink a large glass of water or diet soda before eating. Always have a big glass or bottle of water to drink throughout the day. Avoid high-calorie add-ons such as cream with your coffee, butter, mayonnaise and salad dressings.  Shopping: Do not shop when hungry or tired. Shop from a list and avoid buying anything that is not on your list. If you must have tempting foods, buy individual-sized packages and try to find a lower-calorie alternative. Don't taste test in the store. Read food labels. Compare products to help you make the healthiest choices.  Preparation: Chew a piece of gum while cooking meals. Use a quarter teaspoon if you taste test your food. Try to only fix what you are going  to eat, leaving yourself no chance for seconds. If you have prepared more food than you need, portion it into individual containers and freeze or refrigerate immediately. Don't snack while cooking meals.  Eating: Eat slowly. Remember it takes about 20 minutes for your stomach to send a message to your brain that it is full. Don't let fake hunger make you think you need more. The  ideal way to eat is to take a bite, put your utensil down, take a sip of water, cut your next bite, take a bit, put your utensil down and so on. Do not cut your food all at one time. Cut only as needed. Take small bites and chew your food well. Stop eating for a minute or two at least once during a meal or snack. Take breaks to reflect and have conversation.  Cleanup and Leftovers: Label leftovers for a specific meal or snack. Freeze or refrigerate individual portions of leftovers. Do not clean up if you are still hungry.  Eating Out and Social Eating  Do not arrive hungry. Eat something light before the meal. Try to fill up on low-calorie foods, such as vegetables and fruit, and eat smaller portions of the high-calorie foods. Eat foods that you like, but choose small portions. If you want seconds, wait at least 20 minutes after you have eaten to see if you are actually hungry or if your eyes are bigger than your stomach. Limit alcoholic beverages. Try a soda water with a twist of lime. Do not skip other meals in the day to save room for the special event.  At Restaurants: Order  la carte rather than buffet style. Order some vegetables or a salad for an appetizer instead of eating bread. If you order a high-calorie dish, share it with someone. Try an after-dinner mint with your coffee. If you do have dessert, share it with two or more people. Don't overeat because you do not want to waste food. Ask for a doggie bag to take extra food home. Tell the server to put half of your entree in a to go bag before the meal is served to you. Ask for salad dressing, gravy or high-fat sauces on the side. Dip the tip of your fork in the dressing before each bite. If bread is served, ask for only one piece. Try it plain without butter or oil. At [employer] where oil and vinegar is served with bread, use only a small amount of oil and a lot of vinegar for dipping.  At a Friend's House: Offer  to bring a dish, appetizer or dessert that is low in calories. Serve yourself small portions or tell the host that you only want a small amount. Stand or sit away from the snack table. Stay away from the kitchen or stay busy if you are near the food. Limit your alcohol intake.  At [employer] and Cafeterias: Cover most of your plate with lettuce and/or vegetables. Use a salad plate instead of a dinner plate. After eating, clear away your dishes before having coffee or tea.  Entertaining at Home: Explore low-fat, low-cholesterol cookbooks. Use single-serving foods like chicken breasts or hamburger patties. Prepare low-calorie appetizers and desserts.   Holidays: Keep tempting foods out of sight. Decorate the house without using food. Have low-calorie beverages and foods on hand for guests. Allow yourself one planned treat a day. Don't skip meals to save up for the holiday feast. Eat regular, planned meals.   Exercise Well  Make exercise a  priority and a planned activity in the day. If possible, walk the entire or part of the distance to work. Get an exercise buddy. Go for a walk with a colleague during one of your breaks, go to the gym, run or take a walk with a friend, walk in the mall with a shopping companion. Park at the end of the parking lot and walk to the store or office entrance. Always take the stairs all of the way or at least part of the way to your floor. If you have a desk job, walk around the office frequently. Do leg lifts while sitting at your desk. Do something outside on the weekends like going for a hike or a bike ride.   Have a Healthy Attitude  Make health your weight management priority. Be realistic. Have a goal to achieve a healthier you, not necessarily the lowest weight or ideal weight based on calculations or tables. Focus on a healthy eating style, not on dieting. Dieting usually lasts for a short amount of time and rarely produces long-term  success. Think long term. You are developing new healthy behaviors to follow next month, in a year and in a decade.    This information is for educational purposes only and is not intended to replace the advice of your doctor or health care provider. We encourage you to discuss with your doctor any questions or concerns you may have.        Guidelines for Losing Weight   We want weight loss that will last so you should lose 1-2 pounds a week.  THAT IS IT! Please pick THREE things a month to change. Once it is a habit check off the item. Then pick another three items off the list to become habits.  If you are already doing a habit on the list GREAT!  Cross that item off!  Don't drink your calories. Ie, alcohol, soda, fruit juice, and sweet tea.   Drink more water. Drink a glass when you feel hungry or before each meal.   Eat breakfast - Complex carb and protein (likeDannon light and fit yogurt, oatmeal, fruit, eggs, [place] bacon).  Measure your cereal.  Eat no more than one cup a day. (ie Kashi)  Eat an apple a day.  Add a vegetable a day.  Try a new vegetable a month.  Use Pam! Stop using oil or butter to cook.  Don't finish your plate or use smaller plates.  Share your dessert.  Eat sugar free Jello for dessert or frozen grapes.  Don't eat 2-3 hours before bed.  Switch to whole wheat bread, pasta, and brown rice.  Make healthier choices when you eat out. No fries!  Pick baked chicken, NOT fried.  Don't forget to SLOW DOWN when you eat. It is not going anywhere.   Take the stairs.  Park far away in the parking lot  Lift soup cans (or weights) for 10 minutes while watching TV.  Walk at work for 10 minutes during break.  Walk outside 1 time a week with your friend, kids, dog, or significant other.  Start a walking group at church.  Walk the mall as much as you can tolerate.   Keep a food diary.  Weigh yourself daily.  Walk for 15 minutes 3 days per  week.  Cook at home more often and eat out less. If life happens and you go back to old habits, it is okay.  Just start over. You can do it!  If you experience chest pain, get short of breath, or tired during the exercise, please stop immediately and inform your doctor.    Before you even begin to attack a weight-loss plan, it pays to remember this: You are not fat. You have fat. Losing weight isn't about blame or shame; it's simply another achievement to accomplish. Dieting is like any other skill-you have to buckle down and work at it. As long as you act in a smart, reasonable way, you'll ultimately get where you want to be. Here are some weight loss pearls for you.   1. It's Not a Diet. It's a Lifestyle Thinking of a diet as something you're on and suffering through only for the short term doesn't work. To shed weight and keep it off, you need to make permanent changes to the way you eat. It's OK to indulge occasionally, of course, but if you cut calories temporarily and then revert to your old way of eating, you'll gain back the weight quicker than you can say yo-yo. Use it to lose it. Research shows that one of the best predictors of long-term weight loss is how many pounds you drop in the first month. For that reason, nutritionists often suggest being stricter for the first two weeks of your new eating strategy to build momentum. Cut out added sugar and alcohol and avoid unrefined carbs. After that, figure out how you can reincorporate them in a way that's healthy and maintainable.  2. There's a Right Way to Exercise Working out burns calories and fat and boosts your metabolism by building muscle. But those trying to lose weight are notorious for overestimating the number of calories they burn and underestimating the amount they take in. Unfortunately, your system is biologically programmed to hold on to extra pounds and that means when you start exercising, your body senses the deficit and ramps  up its hunger signals. If you're not diligent, you'll eat everything you burn and then some. Use it, to lose it. Cardio gets all the exercise glory, but strength and interval training are the real heroes. They help you build lean muscle, which in turn increases your metabolism and calorie-burning ability 3. Don't Overreact to Mild Hunger Some people have a hard time losing weight because of hunger anxiety. To them, being hungry is bad-something to be avoided at all costs-so they carry snacks with them and eat when they don't need to. Others eat because they're stressed out or bored. While you never want to get to the point of being ravenous (that's when bingeing is likely to happen), a hunger pang, a craving, or the fact that it's 3:00 p.m. should not send you racing for the vending machine or obsessing about the energy bar in your purse. Ideally, you should put off eating until your stomach is growling and it's difficult to concentrate.  Use it to lose it. When you feel the urge to eat, use the HALT method. Ask yourself, Am I really hungry? Or am I angry or anxious, lonely or bored, or tired? If you're still not certain, try the apple test. If you're truly hungry, an apple should seem delicious; if it doesn't, something else is going on. Or you can try drinking water and making yourself busy, if you are still hungry try a healthy snack.  4. Not All Calories Are Created Equal The mechanics of weight loss are pretty simple: Take in fewer calories than you use for energy. But the kind of food you eat makes  all the difference. Processed food that's high in saturated fat and refined starch or sugar can cause inflammation that disrupts the hormone signals that tell your brain you're full. The result: You eat a lot more.  Use it to lose it. Clean up your diet. Swap in whole, unprocessed foods, including vegetables, lean protein, and healthy fats that will fill you up and give you the biggest nutritional bang for  your calorie buck. In a few weeks, as your brain starts receiving regular hunger and fullness signals once again, you'll notice that you feel less hungry overall and naturally start cutting back on the amount you eat.  5. Protein, Produce, and Plant-Based Fats Are Your Weight-Loss Trinity Here's why eating the three Ps regularly will help you drop pounds. Protein fills you up. You need it to build lean muscle, which keeps your metabolism humming so that you can torch more fat. People in a weight-loss program who ate double the recommended daily allowance for protein (about 110 grams for a 150-pound woman) lost 70 percent of their weight from fat, while people who ate the RDA lost only about 40 percent, one study found. Produce is packed with filling fiber. "It's very difficult to consume too many calories if you're eating a lot of vegetables. Example: Three cups of broccoli is a lot of food, yet only 93 calories. (Fruit is another story. It can be easy to overeat and can contain a lot of calories from sugar, so be sure to monitor your intake.) Plant-based fats like olive oil and those in avocados and nuts are healthy and extra satiating.  Use it to lose it. Aim to incorporate each of the three Ps into every meal and snack. People who eat protein throughout the day are able to keep weight off, according to a study in the [place] of Clinical Nutrition. In addition to meat, poultry and seafood, good sources are beans, lentils, eggs, tofu, and yogurt. As for fat, keep portion sizes in check by measuring out salad dressing, oil, and nut butters (shoot for one to two tablespoons). Finally, eat veggies or a little fruit at every meal. People who did that consumed 308 fewer calories but didn't feel any hungrier than when they didn't eat more produce.  7. How You Eat Is As Important As What You Eat In order for your brain to register that you're full, you need to focus on what you're eating. Sit down  whenever you eat, preferably at a table. Turn off the TV or computer, put down your phone, and look at your food. Smell it. Chew slowly, and don't put another bite on your fork until you swallow. When women ate lunch this attentively, they consumed 30 percent less when snacking later than those who listened to an audiobook at lunchtime, according to a study in the [place] of Nutrition. 8. Weighing Yourself Really Works The scale provides the best evidence about whether your efforts are paying off. Seeing the numbers tick up or down or stagnate is motivation to keep going-or to rethink your approach. A 2015 study at [facility] found that daily weigh-ins helped people lose more weight, keep it off, and maintain that loss, even after two years. Use it to lose it. Step on the scale at the same time every day for the best results. If your weight shoots up several pounds from one weigh-in to the next, don't freak out. Eating a lot of salt the night before or having your period is  the likely culprit. The number should return to normal in a day or two. It's a steady climb that you need to do something about. 9. Too Much Stress and Too Little Sleep Are Your Enemies When you're tired and frazzled, your body cranks up the production of cortisol, the stress hormone that can cause carb cravings. Not getting enough sleep also boosts your levels of ghrelin, a hormone associated with hunger, while suppressing leptin, a hormone that signals fullness and satiety. People on a diet who slept only five and a half hours a night for two weeks lost 55 percent less fat and were hungrier than those who slept eight and a half hours, according to a study in the [place]. Use it to lose it. Prioritize sleep, aiming for seven hours or more a night, which research shows helps lower stress. And make sure you're getting quality zzz's. If a snoring spouse or a fidgety cat wakes you up frequently  throughout the night, you may end up getting the equivalent of just four hours of sleep, according to a study from [facility]. Keep pets out of the bedroom, and use a white-noise app to drown out snoring. 10. You Will Hit a plateau-And You Can Bust Through It As you slim down, your body releases much less leptin, the fullness hormone.  If you're not strength training, start right now. Building muscle can raise your metabolism to help you overcome a plateau. To keep your body challenged and burning calories, incorporate new moves and more intense intervals into your workouts or add another sweat session to your weekly routine. Alternatively, cut an extra 100 calories or so a day from your diet. Now that you've lost weight, your body simply doesn't need as much fuel.    Since food equals calories, in order to lose weight you must either eat fewer calories, exercise more to burn off calories with activity, or both. Food that is not used to fuel the body is stored as fat. A major component of losing weight is to make smarter food choices. Here's how:  1)   Limit non-nutritious foods, such as: Sugar, honey, syrups and candy Pastries, donuts, pies, cakes and cookies Soft drinks, sweetened juices and alcoholic beverages  2)  Cut down on high-fat foods by: - Choosing poultry, fish or lean red meat - Choosing low-fat cooking methods, such as baking, broiling, steaming, grilling and boiling - Using low-fat or non-fat dairy products - Using vinaigrette, herbs, lemon or fat-free salad dressings - Avoiding fatty meats, such as bacon, sausage, franks, ribs and luncheon meats - Avoiding high-fat snacks like nuts, chips and chocolate - Avoiding fried foods - Using less butter, margarine, oil and mayonnaise - Avoiding high-fat gravies, cream sauces and cream-based soups  3) Eat a variety of foods, including: - Fruit and vegetables that are raw, steamed or baked - Whole grains, breads, cereal, rice  and pasta - Dairy products, such as low-fat or non-fat milk or yogurt, low-fat cottage cheese and low-fat cheese - Protein-rich foods like chicken, [place], fish, lean meat and legumes, or beans  4) Change your eating habits by: - Eat three balanced meals a day to help control your hunger - Watch portion sizes and eat small servings of a variety of foods - Choose low-calorie snacks - Eat only when you are hungry and stop when you are satisfied - Eat slowly and try not to perform other tasks while eating - Find other activities to distract you  from food, such as walking, taking up a hobby or being involved in the community - Include regular exercise in your daily routine ( minimum of 20 min of moderate-intensity exercise at least 5 days/week)  - Find a support group, if necessary, for emotional support in your weight loss journey           Easy ways to cut 100 calories   1. Eat your eggs with hot sauce OR salsa instead of cheese.  Eggs are great for breakfast, but many people consider eggs and cheese to be BFFs. Instead of cheese-1 oz. of cheddar has 114 calories-top your eggs with hot sauce, which contains no calories and helps with satiety and metabolism. Salsa is also a great option!!  2. Top your toast, waffles or pancakes with fresh berries instead of jelly or syrup. Half a cup of berries-fresh, frozen or thawed-has about 40 calories, compared with 2 tbsp. of maple syrup or jelly, which both have about 100 calories. The berries will also give you a good punch of fiber, which helps keep you full and satisfied and won't spike blood sugar quickly like the jelly or syrup. 3. Swap the non-fat latte for black coffee with a splash of half-and-half. Contrary to its name, that non-fat latte has 130 calories and a startling 19g of carbohydrates per 16 oz. serving. Replacing that 'light' drinkable dessert with a black coffee with a splash of half-and-half saves you more than 100 calories per 16  oz. serving. 4. Sprinkle salads with freeze-dried raspberries instead of dried cranberries. If you want a sweet addition to your nutritious salad, stay away from dried cranberries. They have a whopping 130 calories per  cup and 30g carbohydrates. Instead, sprinkle freeze-dried raspberries guilt-free and save more than 100 calories per  cup serving, adding 3g of belly-filling fiber. 5. Go for mustard in place of mayo on your sandwich. Mustard can add really nice flavor to any sandwich, and there are tons of varieties, from spicy to honey. A serving of mayo is 95 calories, versus 10 calories in a serving of mustard.  Or try an avocado mayo spread: You can find the recipe few click this link: https://www.californiaavocado.com/recipes/recipe-container/[place]-avocado-mayo 6. Choose a DIY salad dressing instead of the store-bought kind. Mix Dijon or whole grain mustard with low-fat Kefir or red wine vinegar and garlic. 7. Use hummus as a spread instead of a dip. Use hummus as a spread on a high-fiber cracker or tortilla with a sandwich and save on calories without sacrificing taste. 8. Pick just one salad "accessory." Salad isn't automatically a calorie winner. It's easy to over-accessorize with toppings. Instead of topping your salad with nuts, avocado and cranberries (all three will clock in at 313 calories), just pick one. The next day, choose a different accessory, which will also keep your salad interesting. You don't wear all your jewelry every day, right? 9. Ditch the white pasta in favor of spaghetti squash. One cup of cooked spaghetti squash has about 40 calories, compared with traditional spaghetti, which comes with more than 200. Spaghetti squash is also nutrient-dense. It's a good source of fiber and Vitamins A and C, and it can be eaten just like you would eat pasta-with a great tomato sauce and [place] meatballs or with pesto, tofu and spinach, for example. 10. Dress up your chili, soups  and stews with non-fat [place] yogurt instead of sour cream. Just a 'dollop' of sour cream can set you back 115 calories and a whopping 12g of  fat-seven of which are of the artery-clogging variety. Added bonus: [place] yogurt is packed with muscle-building protein, calcium and B Vitamins. 11. Mash cauliflower instead of mashed potatoes. One cup of traditional mashed potatoes-in all their creamy goodness-has more than 200 calories, compared to mashed cauliflower, which you can typically eat for less than 100 calories per 1 cup serving. Cauliflower is a great source of the antioxidant indole-3-carbinol (I3C), which may help reduce the risk of some cancers, like breast cancer. 12. Ditch the ice cream sundae in favor of a [place] yogurt parfait. Instead of a cup of ice cream or fro-yo for dessert, try 1 cup of nonfat Greek yogurt topped with fresh berries and a sprinkle of cacao nibs. Both toppings are packed with antioxidants, which can help reduce cellular inflammation and oxidative damage. And the comparison is a no-brainer: One cup of ice cream has about 275 calories; one cup of frozen yogurt has about 230; and a cup of Greek yogurt has just 130, plus twice the protein, so you're less likely to return to the freezer for a second helping. 13. Put olive oil in a spray container instead of using it directly from the bottle. Each tablespoon of olive oil is 120 calories and 15g of fat. Use a mister instead of pouring it straight into the pan or onto a salad. This allows for portion control and will save you more than 100 calories. 14. When baking, substitute canned pumpkin for butter or oil. Canned pumpkin-not pumpkin pie mix-is loaded with Vitamin A, which is important for skin and eye health, as well as immunity. And the comparisons are pretty crazy:  cup of canned pumpkin has about 40 calories, compared to butter or oil, which has more than 800 calories. Yes, 800 calories. Applesauce and mashed banana can also  serve as good substitutions for butter or oil, usually in a 1:1 ratio. 15. Top casseroles with high-fiber cereal instead of breadcrumbs. Breadcrumbs are typically made with white bread, while breakfast cereals contain 5-9g of fiber per serving. Not only will you save more than 150 calories per  cup serving, the swap will also keep you more full and you'll get a metabolism boost from the added fiber. 16. Snack on pistachios instead of macadamia nuts. Believe it or not, you get the same amount of calories from 35 pistachios (100 calories) as you would from only five macadamia nuts. 17. Chow down on kale chips rather than potato chips. This is my favorite 'don't knock it 'till you try it' swap. Kale chips are so easy to make at home, and you can spice them up with a little grated parmesan or chili powder. Plus, they're a mere fraction of the calories of potato chips, but with the same crunch factor we crave so often. 18. Add seltzer and some fruit slices to your cocktail instead of soda or fruit juice. One cup of soda or fruit juice can pack on as much as 140 calories. Instead, use seltzer and fruit slices. The fruit provides valuable phytochemicals, such as flavonoids and anthocyanins, which help to combat cancer and stave off the aging process.

## 2017-10-20 DIAGNOSIS — E109 Type 1 diabetes mellitus without complications: Secondary | ICD-10-CM | POA: Diagnosis not present

## 2017-10-23 DIAGNOSIS — Z794 Long term (current) use of insulin: Secondary | ICD-10-CM | POA: Diagnosis not present

## 2017-10-23 DIAGNOSIS — E109 Type 1 diabetes mellitus without complications: Secondary | ICD-10-CM | POA: Diagnosis not present

## 2017-10-24 ENCOUNTER — Ambulatory Visit: Payer: BLUE CROSS/BLUE SHIELD | Admitting: Gastroenterology

## 2017-10-24 NOTE — Progress Notes (Deleted)
Referring Provider: Mellody Dance, DO Primary Care Physician:  Mellody Dance, DO  Reason for Consultation:  GERD   IMPRESSION:  ***  PLAN: ***   HPI: Michael Hutchinson is a 42 y.o. male seen in consultation at the request of Dr. Raliegh Scarlet for GERD.  The history is obtained through the patient and review of his electronic health record. When seen by his primary care provider in September he reported significant "burning up" the esophagus.  He has history of GERD and had not been on an H2 blocker or PPI,  GERD worsens with activity/exertion. He was started on omeprazole 40 mg daily.   Past Medical History:  Diagnosis Date  . Charcot's joint of foot 11/25/2013  . Complication of anesthesia    "I wake up angry" (12/31/2015)  . Diabetes mellitus type 1 ([place]) dx'd 1981  . Diabetic ketoacidosis ([place])   . Essential hypertension 05/03/2013  . GERD (gastroesophageal reflux disease)   . High cholesterol   . Hx MRSA infection    Inner thigh and under arm- healed areas  . Meniscus tear   . Shortness of breath    with exertion only    Past Surgical History:  Procedure Laterality Date  . AMPUTATION Left 01/01/2016   Procedure: AMPUTATION BELOW KNEE;  Surgeon: [name], MD;  Location: [facility];  Service: Orthopedics;  Laterality: Left;  . APPLICATION OF WOUND VAC  04/26/2016  . FRACTURE SURGERY    . HERNIA REPAIR    . I&D EXTREMITY Left 04/26/2016   Procedure: IRRIGATION AND DEBRIDEMENT EXTREMITY/Left Leg/Possible Wound Vac;  Surgeon: [name], MD;  Location: [facility];  Service: Orthopedics;  Laterality: Left;  . INCISE AND DRAIN ABCESS Left 04/26/2016  . KNEE ARTHROSCOPY Left ~ 2010  . LAPAROSCOPIC CHOLECYSTECTOMY  2015  . METACARPOPHALANGEAL JOINT ARTHRODESIS Left 06/2012   Fracture left index finger intra-articular MCP joint/notes 06/30/2012  . OPEN REDUCTION INTERNAL FIXATION (ORIF) PROXIMAL PHALANX Left 06/30/2012   Procedure: OPEN REDUCTION INTERNAL FIXATION (ORIF) LEFT INDEX  FINGER PROXIMAL PHALANX FRACTURE WITH LIGAMENT REPAIR AS NECESSARY;  Surgeon: [name], MD;  Location: [facility];  Service: Orthopedics;  Laterality: Left;  . UMBILICAL HERNIA REPAIR  2015   "w/gallbladder OR"    Prior to Admission medications   Medication Sig Start Date End Date Taking? Authorizing Provider  aspirin 81 MG chewable tablet Chew 1 tablet (81 mg total) by mouth daily. 03/29/17   Horton, Barbette Hair, MD  BAYER MICROLET LANCETS lancets Use as instructed to check 3 times daily. 10/12/15   Philemon Kingdom, MD  Blood Glucose Monitoring Suppl (ONE TOUCH ULTRA SYSTEM KIT) w/Device KIT 1 kit by Does not apply route once. Check blood sugar 3 times daily. Diagnosis Diabetes ICD-10 E11.8    [provider]  ciprofloxacin (CIPRO) 750 MG tablet Take 1 tablet (750 mg total) by mouth daily. 09/20/17   Danford, Valetta Fuller D, NP  etodolac (LODINE) 400 MG tablet etodolac 400 mg tablet    [provider]  ezetimibe (ZETIA) 10 MG tablet Take 1 tablet (10 mg total) by mouth at bedtime. 08/08/17   Mellody Dance, DO  Glatiramer Acetate (COPAXONE) 40 MG/ML SOSY Inject 40 mg into the skin every Monday, Wednesday, and Friday. 08/18/17   Pieter Partridge, DO  Glatiramer Acetate 40 MG/ML SOSY Inject 40 mg into the skin as directed. Monday, Wednesday and Friday 09/26/17   Pieter Partridge, DO  glucagon (GLUCAGON EMERGENCY) 1 MG injection Inject 1 mg into the muscle  once as needed. Patient taking differently: Inject 1 mg into the muscle daily as needed (hypoglycemia).  10/12/15   Philemon Kingdom, MD  glucose blood (BAYER CONTOUR NEXT TEST) test strip 1 each by Other route 4 (four) times daily. And lancets 4/day 10/19/16   Renato Shin, MD  HYDROcodone-acetaminophen (NORCO/VICODIN) 5-325 MG tablet hydrocodone 5 mg-acetaminophen 325 mg tablet    [provider]  ibuprofen (ADVIL,MOTRIN) 800 MG tablet Take 1 tablet (800 mg total) by mouth 3 (three) times daily. 01/03/16   Lorella Nimrod, MD  insulin  lispro (HUMALOG) 100 UNIT/ML injection Use 60 units a day via insulin pump. 03/01/17   Renato Shin, MD  losartan-hydrochlorothiazide [facility]) 50-12.5 MG tablet 1/2 tablet twice daily 07/11/17   Opalski, Neoma Laming, DO  montelukast (SINGULAIR) 10 MG tablet Take 1 tablet (10 mg total) by mouth at bedtime. 06/20/17   Pieter Partridge, DO  omeprazole (PRILOSEC) 40 MG capsule Take 1 capsule (40 mg total) by mouth daily. 09/20/17   Danford, Valetta Fuller D, NP  ondansetron (ZOFRAN-ODT) 8 MG disintegrating tablet Take 1 tablet (8 mg total) by mouth every 8 (eight) hours as needed for nausea or vomiting. 09/20/17   Danford, Valetta Fuller D, NP  rosuvastatin (CRESTOR) 10 MG tablet rosuvastatin 10 mg tablet    [provider]  Semaglutide (OZEMPIC) 0.25 or 0.5 MG/DOSE SOPN Inject 0.5 mg into the skin once a week. 09/05/17   Renato Shin, MD  Vitamin D, Ergocalciferol, (DRISDOL) 50000 units CAPS capsule 1 tab every Wednesday and Sunday 07/11/17   Mellody Dance, DO    Current Outpatient Medications  Medication Sig Dispense Refill  . aspirin 81 MG chewable tablet Chew 1 tablet (81 mg total) by mouth daily. 30 tablet 0  . BAYER MICROLET LANCETS lancets Use as instructed to check 3 times daily. 100 each 12  . Blood Glucose Monitoring Suppl (ONE TOUCH ULTRA SYSTEM KIT) w/Device KIT 1 kit by Does not apply route once. Check blood sugar 3 times daily. Diagnosis Diabetes ICD-10 E11.8    . ciprofloxacin (CIPRO) 750 MG tablet Take 1 tablet (750 mg total) by mouth daily. 1 tablet 0  . etodolac (LODINE) 400 MG tablet etodolac 400 mg tablet    . ezetimibe (ZETIA) 10 MG tablet Take 1 tablet (10 mg total) by mouth at bedtime. 90 tablet 1  . Glatiramer Acetate (COPAXONE) 40 MG/ML SOSY Inject 40 mg into the skin every Monday, Wednesday, and Friday. 12 Syringe 3  . Glatiramer Acetate 40 MG/ML SOSY Inject 40 mg into the skin as directed. Monday, Wednesday and Friday 12 Syringe 11  . glucagon (GLUCAGON EMERGENCY) 1 MG injection Inject 1 mg into  the muscle once as needed. (Patient taking differently: Inject 1 mg into the muscle daily as needed (hypoglycemia). ) 1 each 11  . glucose blood (BAYER CONTOUR NEXT TEST) test strip 1 each by Other route 4 (four) times daily. And lancets 4/day 400 each 3  . HYDROcodone-acetaminophen (NORCO/VICODIN) 5-325 MG tablet hydrocodone 5 mg-acetaminophen 325 mg tablet    . ibuprofen (ADVIL,MOTRIN) 800 MG tablet Take 1 tablet (800 mg total) by mouth 3 (three) times daily. 21 tablet 0  . insulin lispro (HUMALOG) 100 UNIT/ML injection Use 60 units a day via insulin pump. 3 vial 11  . losartan-hydrochlorothiazide (HYZAAR) 50-12.5 MG tablet 1/2 tablet twice daily 90 tablet 1  . montelukast (SINGULAIR) 10 MG tablet Take 1 tablet (10 mg total) by mouth at bedtime. 30 tablet 3  . omeprazole (PRILOSEC) 40 MG capsule  Take 1 capsule (40 mg total) by mouth daily. 30 capsule 3  . ondansetron (ZOFRAN-ODT) 8 MG disintegrating tablet Take 1 tablet (8 mg total) by mouth every 8 (eight) hours as needed for nausea or vomiting. 20 tablet 0  . rosuvastatin (CRESTOR) 10 MG tablet rosuvastatin 10 mg tablet    . Semaglutide (OZEMPIC) 0.25 or 0.5 MG/DOSE SOPN Inject 0.5 mg into the skin once a week. 1 pen 11  . Vitamin D, Ergocalciferol, (DRISDOL) 50000 units CAPS capsule 1 tab every Wednesday and Sunday 24 capsule 10   No current facility-administered medications for this visit.     Allergies as of 10/24/2017 - Review Complete 10/09/2017  Allergen Reaction Noted  . Influenza virus vaccine split Nausea And Vomiting 09/26/2014  . Hydrochlorothiazide Other (See Comments) 06/01/2016  . Lisinopril Cough 07/04/2016  . Losartan Other (See Comments) 06/01/2016    Family History  Problem Relation Age of Onset  . Other Mother   . Cancer Mother        Breast / Bone  . Heart attack Father   . Hypertension Father   . Hyperlipidemia Father   . Diabetes Unknown   . Alcohol abuse Sister   . Diabetes Maternal Grandfather   . Stroke  Paternal Grandmother   . Alcohol abuse Paternal Grandfather     Social History   Socioeconomic History  . Marital status: Married    Spouse name: Estill Bamberg  . Number of children: 2  . Years of education: Not on file  . Highest education level: High school graduate  Occupational History  . Occupation: Dealer  Social Needs  . Financial resource strain: Not on file  . Food insecurity:    Worry: Not on file    Inability: Not on file  . Transportation needs:    Medical: Not on file    Non-medical: Not on file  Tobacco Use  . Smoking status: Never Smoker  . Smokeless tobacco: Former Systems developer    Types: Snuff, Chew  Substance and Sexual Activity  . Alcohol use: Yes    Alcohol/week: 14.0 standard drinks    Types: 14 Cans of beer per week  . Drug use: No  . Sexual activity: Yes  Lifestyle  . Physical activity:    Days per week: Not on file    Minutes per session: Not on file  . Stress: Not on file  Relationships  . Social connections:    Talks on phone: Not on file    Gets together: Not on file    Attends religious service: Not on file    Active member of club or organization: Not on file    Attends meetings of clubs or organizations: Not on file    Relationship status: Not on file  . Intimate partner violence:    Fear of current or ex partner: Not on file    Emotionally abused: Not on file    Physically abused: Not on file    Forced sexual activity: Not on file  Other Topics Concern  . Not on file  Social History Narrative   Pt is a right handed married man, lives with his wife and 2 children ages 45 and 27, in a single story house. Pt drinks 6-8, 16oz diet sodas a day.     Review of Systems: 12 system ROS is negative except as noted above.   Physical Exam: Vital signs in last 24 hours: _0 @   General:   Alert,  Well-developed, well-nourished, pleasant and cooperative  in NAD Head:  Normocephalic and atraumatic. Eyes:  Sclera clear, no icterus.   Conjunctiva  pink. Ears:  Normal auditory acuity. Nose:  No deformity, discharge,  or lesions. Mouth:  No deformity or lesions.   Neck:  Supple; no masses or thyromegaly. Lungs:  Clear throughout to auscultation.   No wheezes, crackles, or rhonchi. Heart:  Regular rate and rhythm; no murmurs, clicks, rubs,  or gallops. Abdomen:  Soft,nontender, BS active,nonpalp mass or hsm.   Rectal:  Deferred  Msk:  Symmetrical without gross deformities. . Pulses:  Normal pulses noted. Extremities:  Without clubbing or edema. Neurologic:  Alert and  oriented x4;  grossly normal neurologically. Skin:  Intact without significant lesions or rashes.. Psych:  Alert and cooperative. Normal mood and affect.    [place]  10/24/2017, 11:22 AM

## 2017-10-30 DIAGNOSIS — Z89612 Acquired absence of left leg above knee: Secondary | ICD-10-CM | POA: Diagnosis not present

## 2017-11-06 ENCOUNTER — Encounter: Payer: Self-pay | Admitting: Endocrinology

## 2017-11-06 ENCOUNTER — Ambulatory Visit (INDEPENDENT_AMBULATORY_CARE_PROVIDER_SITE_OTHER): Payer: BLUE CROSS/BLUE SHIELD | Admitting: Endocrinology

## 2017-11-06 VITALS — BP 126/78 | HR 100 | Ht 72.0 in | Wt 316.8 lb

## 2017-11-06 DIAGNOSIS — E10610 Type 1 diabetes mellitus with diabetic neuropathic arthropathy: Secondary | ICD-10-CM | POA: Diagnosis not present

## 2017-11-06 DIAGNOSIS — IMO0002 Reserved for concepts with insufficient information to code with codable children: Secondary | ICD-10-CM

## 2017-11-06 DIAGNOSIS — E1065 Type 1 diabetes mellitus with hyperglycemia: Secondary | ICD-10-CM | POA: Diagnosis not present

## 2017-11-06 LAB — POCT GLYCOSYLATED HEMOGLOBIN (HGB A1C): Hemoglobin A1C: 7 % — AB (ref 4.0–5.6)

## 2017-11-06 NOTE — Progress Notes (Signed)
Subjective:    Patient ID: Michael Hutchinson, male    DOB: [birth year], 42 y.o.   MRN: [id number]  HPI Pt returns for f/u of diabetes mellitus: DM type: 1 Dx'ed: 8127 Complications: left BKA, polyneuropathy, renal insuff, PAD, and right charcot foot.  Therapy: insulin since dx, and pump rx ([id number]), since 2018.   DKA: once, in 2009 Severe hypoglycemia: once, in 2008.   Pancreatitis: never Pancreatic imaging: never Interval History: He takes these settings:  basal rate of 0.4 units/hr, 24 HRS per day.    mealtime bolus of 1 unit/12 grams carbohydrate.  correction bolus (which some people call "sensitivity," or "insulin sensitivity ratio," or just "isr") of 1 unit for each 50 by which your glucose exceeds 120.   Total daily dosage: approx 50 units per day.  Our office has downloaded continuous glucose monitor data, and we reviewed together.  It varies from 50-270, but the vast majority are in the 100's.  There is little if any trend throughout the day. He stopped Ozempic, due to nausea and myalgias. Past Medical History:  Diagnosis Date  . Charcot's joint of foot 11/25/2013  . Complication of anesthesia    "I wake up angry" (12/31/2015)  . Diabetes mellitus type 1 ([place]) dx'd 1981  . Diabetic ketoacidosis ([place])   . Essential hypertension 05/03/2013  . GERD (gastroesophageal reflux disease)   . High cholesterol   . Hx MRSA infection    Inner thigh and under arm- healed areas  . Meniscus tear   . Shortness of breath    with exertion only    Past Surgical History:  Procedure Laterality Date  . AMPUTATION Left 01/01/2016   Procedure: AMPUTATION BELOW KNEE;  Surgeon: [name], MD;  Location: [facility];  Service: Orthopedics;  Laterality: Left;  . APPLICATION OF WOUND VAC  04/26/2016  . FRACTURE SURGERY    . HERNIA REPAIR    . I&D EXTREMITY Left 04/26/2016   Procedure: IRRIGATION AND DEBRIDEMENT EXTREMITY/Left Leg/Possible Wound Vac;  Surgeon: [name], MD;  Location: [facility];   Service: Orthopedics;  Laterality: Left;  . INCISE AND DRAIN ABCESS Left 04/26/2016  . KNEE ARTHROSCOPY Left ~ 2010  . LAPAROSCOPIC CHOLECYSTECTOMY  2015  . METACARPOPHALANGEAL JOINT ARTHRODESIS Left 06/2012   Fracture left index finger intra-articular MCP joint/notes 06/30/2012  . OPEN REDUCTION INTERNAL FIXATION (ORIF) PROXIMAL PHALANX Left 06/30/2012   Procedure: OPEN REDUCTION INTERNAL FIXATION (ORIF) LEFT INDEX FINGER PROXIMAL PHALANX FRACTURE WITH LIGAMENT REPAIR AS NECESSARY;  Surgeon: [name], MD;  Location: [facility];  Service: Orthopedics;  Laterality: Left;  . UMBILICAL HERNIA REPAIR  2015   "w/gallbladder OR"    Social History   Socioeconomic History  . Marital status: Married    Spouse name: Estill Bamberg  . Number of children: 2  . Years of education: Not on file  . Highest education level: High school graduate  Occupational History  . Occupation: Dealer  Social Needs  . Financial resource strain: Not on file  . Food insecurity:    Worry: Not on file    Inability: Not on file  . Transportation needs:    Medical: Not on file    Non-medical: Not on file  Tobacco Use  . Smoking status: Never Smoker  . Smokeless tobacco: Former Systems developer    Types: Snuff, Chew  Substance and Sexual Activity  . Alcohol use: Yes    Alcohol/week: 14.0 standard drinks    Types: 14 Cans of beer per week  .  Drug use: No  . Sexual activity: Yes  Lifestyle  . Physical activity:    Days per week: Not on file    Minutes per session: Not on file  . Stress: Not on file  Relationships  . Social connections:    Talks on phone: Not on file    Gets together: Not on file    Attends religious service: Not on file    Active member of club or organization: Not on file    Attends meetings of clubs or organizations: Not on file    Relationship status: Not on file  . Intimate partner violence:    Fear of current or ex partner: Not on file    Emotionally abused: Not on file    Physically abused: Not  on file    Forced sexual activity: Not on file  Other Topics Concern  . Not on file  Social History Narrative   Pt is a right handed married man, lives with his wife and 2 children ages 45 and 7, in a single story house. Pt drinks 6-8, 16oz diet sodas a day.     Current Outpatient Medications on File Prior to Visit  Medication Sig Dispense Refill  . aspirin 81 MG chewable tablet Chew 1 tablet (81 mg total) by mouth daily. 30 tablet 0  . BAYER MICROLET LANCETS lancets Use as instructed to check 3 times daily. 100 each 12  . Blood Glucose Monitoring Suppl (ONE TOUCH ULTRA SYSTEM KIT) w/Device KIT 1 kit by Does not apply route once. Check blood sugar 3 times daily. Diagnosis Diabetes ICD-10 E11.8    . etodolac (LODINE) 400 MG tablet etodolac 400 mg tablet    . ezetimibe (ZETIA) 10 MG tablet Take 1 tablet (10 mg total) by mouth at bedtime. 90 tablet 1  . Glatiramer Acetate (COPAXONE) 40 MG/ML SOSY Inject 40 mg into the skin every Monday, Wednesday, and Friday. 12 Syringe 3  . Glatiramer Acetate 40 MG/ML SOSY Inject 40 mg into the skin as directed. Monday, Wednesday and Friday 12 Syringe 11  . glucagon (GLUCAGON EMERGENCY) 1 MG injection Inject 1 mg into the muscle once as needed. (Patient taking differently: Inject 1 mg into the muscle daily as needed (hypoglycemia). ) 1 each 11  . glucose blood (BAYER CONTOUR NEXT TEST) test strip 1 each by Other route 4 (four) times daily. And lancets 4/day 400 each 3  . HYDROcodone-acetaminophen (NORCO/VICODIN) 5-325 MG tablet hydrocodone 5 mg-acetaminophen 325 mg tablet    . ibuprofen (ADVIL,MOTRIN) 800 MG tablet Take 1 tablet (800 mg total) by mouth 3 (three) times daily. 21 tablet 0  . insulin lispro (HUMALOG) 100 UNIT/ML injection Use 60 units a day via insulin pump. 3 vial 11  . losartan-hydrochlorothiazide (HYZAAR) 50-12.5 MG tablet 1/2 tablet twice daily 90 tablet 1  . montelukast (SINGULAIR) 10 MG tablet Take 1 tablet (10 mg total) by mouth at bedtime.  30 tablet 3  . omeprazole (PRILOSEC) 40 MG capsule Take 1 capsule (40 mg total) by mouth daily. 30 capsule 3  . ondansetron (ZOFRAN-ODT) 8 MG disintegrating tablet Take 1 tablet (8 mg total) by mouth every 8 (eight) hours as needed for nausea or vomiting. 20 tablet 0  . rosuvastatin (CRESTOR) 10 MG tablet rosuvastatin 10 mg tablet    . Vitamin D, Ergocalciferol, (DRISDOL) 50000 units CAPS capsule 1 tab every Wednesday and Sunday 24 capsule 10   No current facility-administered medications on file prior to visit.     Allergies  Allergen Reactions  . Influenza Virus Vaccine Split Nausea And Vomiting  . Hydrochlorothiazide Other (See Comments)    Dizziness  . Lisinopril Cough  . Losartan Other (See Comments)    Dizziness    Family History  Problem Relation Age of Onset  . Other Mother   . Cancer Mother        Breast / Bone  . Heart attack Father   . Hypertension Father   . Hyperlipidemia Father   . Diabetes Unknown   . Alcohol abuse Sister   . Diabetes Maternal Grandfather   . Stroke Paternal Grandmother   . Alcohol abuse Paternal Grandfather     BP 126/78 (BP Location: Left Arm, Patient Position: Sitting, Cuff Size: Large)   Pulse 100   Ht 6' (1.829 m)   Wt (!) 316 lb 12.8 oz (143.7 kg)   SpO2 97%   BMI 42.97 kg/m   Review of Systems Denies LOC    Objective:   Physical Exam GENERAL: no distress Pulses: right foot pulses are intact.   MSK: no deformity of the right foot or ankle.   CV: 1+ edema of the right leg. Skin:  no ulcer on the right foot or ankle.  normal temp on the feet and ankles.  There is hyperpigmentation of the right foot.   Neuro: sensation is intact to touch on the feet and ankle, but severely decreased from normal.  Ext: left BKA  Right ankle and foot deformities are again noted.   Lab Results  Component Value Date   HGBA1C 7.0 (A) 11/06/2017       Assessment & Plan:  Type 1 DM, with PAD Nausea: new.  This limits rx options.  Patient  Instructions  Please take these pump settings:  basal rate of 0.4 units/hr, 24 HRS per day (when not in auto mode) mealtime bolus of 1 unit/12 grams carbohydrate.  correction bolus (which some people call "sensitivity," or "insulin sensitivity ratio," or just "isr") of 1 unit for each 50 by which your glucose exceeds 120.  Please come back for a follow-up appointment in 3-4 months.

## 2017-11-06 NOTE — Patient Instructions (Addendum)
Please take these pump settings:  basal rate of 0.4 units/hr, 24 HRS per day (when not in auto mode) mealtime bolus of 1 unit/12 grams carbohydrate.  correction bolus (which some people call "sensitivity," or "insulin sensitivity ratio," or just "isr") of 1 unit for each 50 by which your glucose exceeds 120.  Please come back for a follow-up appointment in 3-4 months.

## 2017-11-07 ENCOUNTER — Ambulatory Visit: Payer: BLUE CROSS/BLUE SHIELD | Admitting: Family Medicine

## 2017-11-20 ENCOUNTER — Ambulatory Visit: Payer: BLUE CROSS/BLUE SHIELD | Admitting: Adult Health

## 2017-11-22 ENCOUNTER — Ambulatory Visit (INDEPENDENT_AMBULATORY_CARE_PROVIDER_SITE_OTHER): Payer: BLUE CROSS/BLUE SHIELD | Admitting: Family Medicine

## 2017-11-22 ENCOUNTER — Encounter: Payer: Self-pay | Admitting: Family Medicine

## 2017-11-22 VITALS — BP 129/86 | HR 96 | Ht 72.0 in | Wt 312.0 lb

## 2017-11-22 NOTE — Patient Instructions (Addendum)
Goals:   Weight loss of 4 lbs in 4 weeks.  Continue to track on Lose It app.

## 2017-11-22 NOTE — Progress Notes (Signed)
Wt loss OV note   Impression and Recommendations:    1. Obesity, Class III, BMI 40-49.9 (morbid obesity) (HCC)     Weight Loss -Pt has lost 25 lbs since July 2019 -Set goal for 4lbs of weight loss in 1 month, 1 lb per week -Pt to return for weight loss check in 4 weeks -Discussed that I could refer the patient to a Bariatric Specialist.  -Discussed with the patient regarding having a support group with his wife to aid in weight loss.  Michael Hutchinson through the patient's Lose It! App and discussed his prior days of calorie intake and discussed how to improve this.  -Discussed with the patient regarding various healthy dietary choices.  Encouraged pt to continue making positive dietary choices to keep losing weight -Instructed pt not to eat additional calories that are added with exercise in order to make goals -Encouraged pt to find support for his lifestyle changes such as including his wife and friends -Advised the patient to increase the amount of protein in his diet to aid with weight loss.   - Weight Mgt: Explained to patient what BMI refers to, and what it means medically. Told patient to think about it as a "medical risk stratification measurement" and how increasing BMI is associated with increasing risk/ or worsening state of various diseases such as hypertension, hyperlipidemia, diabetes, premature OA, depression etc.  American Heart Association guidelines for healthy diet, basically Mediterranean diet, and exercise guidelines of 30 minutes 5 days per week or more discussed in detail.  -Reminded patient the need for yearly complete physical exam office visits in addition to office visits for management of the chronic diseases   Education and routine counseling performed. Handouts provided.    No orders of the defined types were placed in this encounter.   No orders of the defined types were placed in this encounter.   The patient was counseled, risk factors were  discussed, anticipatory guidance given.  -Gross side effects, risk and benefits, and alternatives of medications discussed with patient.  Patient is aware that all medications have potential side effects and we are unable to predict every side effect or drug-drug interaction that may occur.  Expresses verbal understanding and consents to current therapy plan and treatment regimen.   Return for 4 wks- 4lb wt loss.   Please see AVS handed out to patient at the end of our visit for further patient instructions/ counseling done pertaining to today's office visit.    Note:  This document was prepared using Dragon voice recognition software and may include unintentional dictation errors.  This document serves as a record of services personally performed by Mellody Dance, DO. It was created on her behalf by Steva Colder, a trained medical scribe. The creation of this record is based on the scribe's personal observations and the provider's statements to them.   I have reviewed the above medical documentation for accuracy and completeness and I concur.  Mellody Dance, DO, D.O. 11/22/2017 5:24 PM  _____________________________________________________________________    Subjective:  HPI: Michael Hutchinson y.o. male  presents for 3 month follow up for multiple medical problems.  He hasn't been recording his food on the app because he lost his phone. He doesn't own a scale at home. He is not able to complete more physical activity because he is tired. He notes that he doesn't eat fried foods and he eats a lot of chicken to aid with healthy eating habits. He rarely eats red  meat. He very rarely eats sweets.     Weight:  Wt Readings from Last 3 Encounters:  11/22/17 (!) 312 lb (141.5 kg)  11/06/17 (!) 316 lb 12.8 oz (143.7 kg)  10/09/17 (!) 311 lb 8 oz (141.3 kg)   BMI Readings from Last 3 Encounters:  11/22/17 42.31 kg/m  11/06/17 42.97 kg/m  10/09/17 42.25 kg/m   Lab Results    Component Value Date   HGBA1C 7.0 (A) 11/06/2017   HGBA1C 7.4 (H) 08/08/2017   HGBA1C 6.9 (A) 06/01/2017    Review of Systems: General:   No F/C, wt loss Pulm:   No DIB, SOB, pleuritic chest pain Card:  No CP, palpitations Abd:  No n/v/d or pain Ext:  No inc edema from baseline   Objective: Physical Exam: BP 129/86   Pulse 96   Ht 6' (1.829 m)   Wt (!) 312 lb (141.5 kg)   SpO2 99%   BMI 42.31 kg/m  Body mass index is 42.31 kg/m. General: Well nourished, in no apparent distress. Eyes: PERRLA, EOMs, conjunctiva clr no swelling or erythema ENT/Mouth: Hearing appears normal.  Mucus Membranes Moist  Neck: Supple, no masses Resp: Respiratory effort- normal, ECTA B/L w/o W/R/R  Cardio: RRR w/o MRGs. Abdomen: no gross distention. Lymphatics:  Brisk peripheral pulses, less 2 sec cap RF, no gross edema  M-sk: Full ROM, 5/5 strength, normal gait.  Skin: Warm, dry without rashes, lesions, ecchymosis.  Neuro: Alert, Oriented Psych: Normal affect, Insight and Judgment appropriate.    Current Medications:  Current Outpatient Medications on File Prior to Visit  Medication Sig Dispense Refill  . aspirin 81 MG chewable tablet Chew 1 tablet (81 mg total) by mouth daily. 30 tablet 0  . BAYER MICROLET LANCETS lancets Use as instructed to check 3 times daily. 100 each 12  . Blood Glucose Monitoring Suppl (ONE TOUCH ULTRA SYSTEM KIT) w/Device KIT 1 kit by Does not apply route once. Check blood sugar 3 times daily. Diagnosis Diabetes ICD-10 E11.8    . etodolac (LODINE) 400 MG tablet etodolac 400 mg tablet    . ezetimibe (ZETIA) 10 MG tablet Take 1 tablet (10 mg total) by mouth at bedtime. 90 tablet 1  . Glatiramer Acetate (COPAXONE) 40 MG/ML SOSY Inject 40 mg into the skin every Monday, Wednesday, and Friday. 12 Syringe 3  . Glatiramer Acetate 40 MG/ML SOSY Inject 40 mg into the skin as directed. Monday, Wednesday and Friday 12 Syringe 11  . glucagon (GLUCAGON EMERGENCY) 1 MG injection  Inject 1 mg into the muscle once as needed. (Patient taking differently: Inject 1 mg into the muscle daily as needed (hypoglycemia). ) 1 each 11  . glucose blood (BAYER CONTOUR NEXT TEST) test strip 1 each by Other route 4 (four) times daily. And lancets 4/day 400 each 3  . HYDROcodone-acetaminophen (NORCO/VICODIN) 5-325 MG tablet hydrocodone 5 mg-acetaminophen 325 mg tablet    . ibuprofen (ADVIL,MOTRIN) 800 MG tablet Take 1 tablet (800 mg total) by mouth 3 (three) times daily. 21 tablet 0  . insulin lispro (HUMALOG) 100 UNIT/ML injection Use 60 units a day via insulin pump. 3 vial 11  . losartan-hydrochlorothiazide (HYZAAR) 50-12.5 MG tablet 1/2 tablet twice daily 90 tablet 1  . montelukast (SINGULAIR) 10 MG tablet Take 1 tablet (10 mg total) by mouth at bedtime. 30 tablet 3  . omeprazole (PRILOSEC) 40 MG capsule Take 1 capsule (40 mg total) by mouth daily. 30 capsule 3  . ondansetron (ZOFRAN-ODT) 8 MG disintegrating tablet  Take 1 tablet (8 mg total) by mouth every 8 (eight) hours as needed for nausea or vomiting. 20 tablet 0  . rosuvastatin (CRESTOR) 10 MG tablet rosuvastatin 10 mg tablet    . Vitamin D, Ergocalciferol, (DRISDOL) 50000 units CAPS capsule 1 tab every Wednesday and Sunday 24 capsule 10   No current facility-administered medications on file prior to visit.     Medical History:  Patient Active Problem List   Diagnosis Date Noted  . Multiple sclerosis ([place]) 07/11/2017    Priority: High  . Hx of BKA, left - with subsequent cellulitis and treatment for osteomylitis 06/01/2016    Priority: High  . Obesity, Class III, BMI 40-49.9 (morbid obesity) ([place]) 09/22/2015    Priority: High  . Diabetes mellitus type 1 (HCC)     Priority: High  . Hypertension associated with diabetes ([place]) 05/03/2013    Priority: High  . Mixed diabetic hyperlipidemia associated with type 1 diabetes mellitus ([place]) 07/26/2012    Priority: High  . Low HDL (under 40) 09/22/2015    Priority: Medium  . GERD  (gastroesophageal reflux disease)     Priority: Medium  . Hx MRSA infection 09/22/2015    Priority: Low  . Burning in the chest 09/20/2017  . Statin intolerance 08/08/2017  . Low hemoglobin 07/11/2017  . Lateral epicondylitis of right elbow 03/20/2017  . Pain in joint of right elbow 03/20/2017  . Chronic fatigue 07/04/2016  . Impaired exercise tolerance 07/04/2016  . Acute left otitis media 06/09/2016  . Abscess of left leg 04/26/2016  . Diabetes mellitus with complication ([place])   . Normocytic anemia 01/01/2016  . Osteomyelitis ([place]) 12/31/2015  . Diabetic ulcer of left heel associated with type 1 diabetes mellitus, with necrosis of bone ([place]) 12/31/2015  . Ankle wound 12/31/2015  . Chronic osteomyelitis of left ankle with draining sinus ([place])   . Type 1 diabetes, uncontrolled, with Charcot's joint of foot ([place]) 10/12/2015  . H/O noncompliance with medical treatment, presenting hazards to health 09/22/2015  . Vitamin D deficiency 09/22/2015  . Charcot's joint of foot 11/25/2013  . Diabetic ketoacidosis ([place])   . Meniscus tear   . Epigastric pain 12/10/2011  . Vomiting 12/10/2011  . Problems influencing health status 11/21/2011    Allergies:  Allergies  Allergen Reactions  . Influenza Virus Vaccine Split Nausea And Vomiting  . Hydrochlorothiazide Other (See Comments)    Dizziness  . Lisinopril Cough  . Losartan Other (See Comments)    Dizziness     Family history-  Reviewed; changed as appropriate  Social history-  Reviewed; changed as appropriate

## 2017-11-28 ENCOUNTER — Ambulatory Visit: Payer: BLUE CROSS/BLUE SHIELD | Admitting: Neurology

## 2017-12-20 ENCOUNTER — Ambulatory Visit: Payer: BLUE CROSS/BLUE SHIELD | Admitting: Family Medicine

## 2018-01-01 DIAGNOSIS — Z89512 Acquired absence of left leg below knee: Secondary | ICD-10-CM | POA: Diagnosis not present

## 2018-01-08 ENCOUNTER — Other Ambulatory Visit: Payer: Self-pay | Admitting: Family Medicine

## 2018-01-08 DIAGNOSIS — E1159 Type 2 diabetes mellitus with other circulatory complications: Secondary | ICD-10-CM

## 2018-01-08 DIAGNOSIS — I10 Essential (primary) hypertension: Principal | ICD-10-CM

## 2018-01-08 DIAGNOSIS — I152 Hypertension secondary to endocrine disorders: Secondary | ICD-10-CM

## 2018-01-08 NOTE — Telephone Encounter (Signed)
Per Yancey Hamburger, NP ok to refill.  Tiajuana Amass, CMA

## 2018-02-09 ENCOUNTER — Ambulatory Visit: Payer: BLUE CROSS/BLUE SHIELD | Admitting: Endocrinology

## 2018-02-14 DIAGNOSIS — M25511 Pain in right shoulder: Secondary | ICD-10-CM | POA: Insufficient documentation

## 2018-02-14 DIAGNOSIS — M7541 Impingement syndrome of right shoulder: Secondary | ICD-10-CM | POA: Diagnosis not present

## 2018-02-15 NOTE — Progress Notes (Signed)
NEUROLOGY FOLLOW UP OFFICE NOTE  Michael Hutchinson [id number]  HISTORY OF PRESENT ILLNESS: Michael Hutchinson is a 43 year old right-handed Caucasian man with type 1 diabetes with history of DKA, hypertension, Charcot's joint of foot, left below the knee amputation, hyperlipidemia and GERD who follows up for multiple sclerosis.  He is accompanied by his wife who supplements history.  UPDATE: Current disease modifying therapy: Copaxone Other current medications: D3 50,000 IU twice weekly (have missed some doses)  Vision:  Vision has cleared.   Motor:  Sometimes feels right arm is weaker than left. Sensory:  No numbness. Pain:  Pain in legs and low back, right shoulder pain Gait:  No change Bowel/Bladder:  No issues. Fatigue:  yes Cognition:  no Mood:  No depression  HISTORY:  In October 2016, he developed a right 6th cranial nerve palsy which was thought to be due to his uncontrolled type 1 diabetes.  In March 2017, he developed blurred vision in the left eye.  He saw ophthalmologist Dr. Julian Reil who diagnosed optic neuritis.  MRI of the brain and orbits with and without contrast from 05/11/2015 revealed "2 T2 hyperintense lesions within the left hemisphere adjacent to the posterior horn of the left lateral ventricle ", a nonspecific finding and without abnormal contrast enhancement.  In March 2019, he developed disconjugate gaze and droopy left eyelid.  He saw the eye doctor who diagnosed a left 3rd nerve palsy.  He was evaluated in the ED at [facility] on 03/28/2017.  CTA of the head was negative for aneurysm.  MRI of the brain with and without contrast demonstrated multiple T2/flair hyperintense foci involving the periventricular, deep and subcortical white matter, progressed compared to prior MRI from 2017.  In-house neurology believed his findings were more likely due to a diabetic 3rd nerve palsy rather than MS.  He was advised to start aspirin 81 mg daily and follow-up with  outpatient neurology.  He subsequently underwent a work-up for multiple sclerosis.  MRI of the cervical spine with and without contrast from 05/04/2017 revealed no cord lesions.  He underwent lumbar puncture on 05/19/2017 for CSF analysis which revealed elevated IgG index of 0.79 and 4 oligoclonal bands not present in the corresponding serum.  Cell count was 1, elevated protein 157, elevated glucose 100, negative myelin basic protein, ACE 4, Gram stain and culture negative.  Past disease modifying therapy: Tecfidera (GI upset)  PAST MEDICAL HISTORY: Past Medical History:  Diagnosis Date  . Charcot's joint of foot 11/25/2013  . Complication of anesthesia    "I wake up angry" (12/31/2015)  . Diabetes mellitus type 1 ([place]) dx'd 1981  . Diabetic ketoacidosis ([place])   . Essential hypertension 05/03/2013  . GERD (gastroesophageal reflux disease)   . High cholesterol   . Hx MRSA infection    Inner thigh and under arm- healed areas  . Meniscus tear   . Shortness of breath    with exertion only    MEDICATIONS: Current Outpatient Medications on File Prior to Visit  Medication Sig Dispense Refill  . aspirin 81 MG chewable tablet Chew 1 tablet (81 mg total) by mouth daily. 30 tablet 0  . BAYER MICROLET LANCETS lancets Use as instructed to check 3 times daily. 100 each 12  . Blood Glucose Monitoring Suppl (ONE TOUCH ULTRA SYSTEM KIT) w/Device KIT 1 kit by Does not apply route once. Check blood sugar 3 times daily. Diagnosis Diabetes ICD-10 E11.8    . etodolac (LODINE) 400 MG  tablet etodolac 400 mg tablet    . ezetimibe (ZETIA) 10 MG tablet Take 1 tablet (10 mg total) by mouth at bedtime. 90 tablet 1  . Glatiramer Acetate (COPAXONE) 40 MG/ML SOSY Inject 40 mg into the skin every Monday, Wednesday, and Friday. 12 Syringe 3  . Glatiramer Acetate 40 MG/ML SOSY Inject 40 mg into the skin as directed. Monday, Wednesday and Friday 12 Syringe 11  . glucagon (GLUCAGON EMERGENCY) 1 MG injection Inject 1 mg into  the muscle once as needed. (Patient taking differently: Inject 1 mg into the muscle daily as needed (hypoglycemia). ) 1 each 11  . glucose blood (BAYER CONTOUR NEXT TEST) test strip 1 each by Other route 4 (four) times daily. And lancets 4/day 400 each 3  . HYDROcodone-acetaminophen (NORCO/VICODIN) 5-325 MG tablet hydrocodone 5 mg-acetaminophen 325 mg tablet    . ibuprofen (ADVIL,MOTRIN) 800 MG tablet Take 1 tablet (800 mg total) by mouth 3 (three) times daily. 21 tablet 0  . insulin lispro (HUMALOG) 100 UNIT/ML injection Use 60 units a day via insulin pump. 3 vial 11  . losartan-hydrochlorothiazide (HYZAAR) 50-12.5 MG tablet TAKE 1/2 TABLET BY MOUTH TWICE A DAY 90 tablet 0  . montelukast (SINGULAIR) 10 MG tablet Take 1 tablet (10 mg total) by mouth at bedtime. 30 tablet 3  . omeprazole (PRILOSEC) 40 MG capsule Take 1 capsule (40 mg total) by mouth daily. 30 capsule 3  . ondansetron (ZOFRAN-ODT) 8 MG disintegrating tablet Take 1 tablet (8 mg total) by mouth every 8 (eight) hours as needed for nausea or vomiting. 20 tablet 0  . rosuvastatin (CRESTOR) 10 MG tablet rosuvastatin 10 mg tablet    . Vitamin D, Ergocalciferol, (DRISDOL) 50000 units CAPS capsule 1 tab every Wednesday and Sunday 24 capsule 10   No current facility-administered medications on file prior to visit.     ALLERGIES: Allergies  Allergen Reactions  . Influenza Virus Vaccine Split Nausea And Vomiting  . Hydrochlorothiazide Other (See Comments)    Dizziness  . Lisinopril Cough  . Losartan Other (See Comments)    Dizziness    FAMILY HISTORY: Family History  Problem Relation Age of Onset  . Other Mother   . Cancer Mother        Breast / Bone  . Heart attack Father   . Hypertension Father   . Hyperlipidemia Father   . Diabetes Unknown   . Alcohol abuse Sister   . Diabetes Maternal Grandfather   . Stroke Paternal Grandmother   . Alcohol abuse Paternal Grandfather    SOCIAL HISTORY: Social History   Socioeconomic  History  . Marital status: Married    Spouse name: Estill Bamberg  . Number of children: 2  . Years of education: Not on file  . Highest education level: High school graduate  Occupational History  . Occupation: Dealer  Social Needs  . Financial resource strain: Not on file  . Food insecurity:    Worry: Not on file    Inability: Not on file  . Transportation needs:    Medical: Not on file    Non-medical: Not on file  Tobacco Use  . Smoking status: Never Smoker  . Smokeless tobacco: Former Systems developer    Types: Snuff, Chew  Substance and Sexual Activity  . Alcohol use: Yes    Alcohol/week: 14.0 standard drinks    Types: 14 Cans of beer per week  . Drug use: No  . Sexual activity: Yes  Lifestyle  . Physical activity:  Days per week: Not on file    Minutes per session: Not on file  . Stress: Not on file  Relationships  . Social connections:    Talks on phone: Not on file    Gets together: Not on file    Attends religious service: Not on file    Active member of club or organization: Not on file    Attends meetings of clubs or organizations: Not on file    Relationship status: Not on file  . Intimate partner violence:    Fear of current or ex partner: Not on file    Emotionally abused: Not on file    Physically abused: Not on file    Forced sexual activity: Not on file  Other Topics Concern  . Not on file  Social History Narrative   Pt is a right handed married man, lives with his wife and 2 children ages 66 and 75, in a single story house. Pt drinks 6-8, 16oz diet sodas a day.     REVIEW OF SYSTEMS: Constitutional: No fevers, chills, or sweats, no generalized fatigue, change in appetite Eyes: No visual changes, double vision, eye pain Ear, nose and throat: No hearing loss, ear pain, nasal congestion, sore throat Cardiovascular: No chest pain, palpitations Respiratory:  No shortness of breath at rest or with exertion, wheezes GastrointestinaI: No nausea, vomiting, diarrhea,  abdominal pain, fecal incontinence Genitourinary:  No dysuria, urinary retention or frequency Musculoskeletal:  No neck pain, back pain Integumentary: No rash, pruritus, skin lesions Neurological: as above Psychiatric: No depression, insomnia, anxiety Endocrine: No palpitations, fatigue, diaphoresis, mood swings, change in appetite, change in weight, increased thirst Hematologic/Lymphatic:  No purpura, petechiae. Allergic/Immunologic: no itchy/runny eyes, nasal congestion, recent allergic reactions, rashes  PHYSICAL EXAM: Blood pressure (!) 130/92, pulse (!) 107, height 6' (1.829 m), weight (!) 333 lb (151 kg), SpO2 96 %. General: No acute distress.  Patient appears well-groomed. Head:  Normocephalic/atraumatic Eyes:  Fundi examined but not visualized Neck: supple, no paraspinal tenderness, full range of motion Heart:  Regular rate and rhythm Lungs:  Clear to auscultation bilaterally Back: No paraspinal tenderness Neurological Exam: alert and oriented to person, place, and time. Attention span and concentration intact, recent and remote memory intact, fund of knowledge intact.  Speech fluent and not dysarthric, language intact.  CN II-XII intact. Bulk and tone normal, muscle strength 5/5 throughout.  Sensation to light touch, temperature and vibration intact.  Deep tendon reflexes 1+ throughout.  Finger to nose testing intact.  Gait normal, Romberg negative.  IMPRESSION: 1.  Multiple sclerosis.  While ischemic cranial nerve palsies are seen in uncontrolled diabetes, given the 3 distinct episodes of various cranial nerve palsies, the appearance of white matter lesions on brain MRI, and CSF analysis demonstrated for oligoclonal bands not present in serum, I suspect MS.  He does have significantly elevated CSF protein but does not exhibit any sign of infection.  Diabetes may be contributor to this. 2.  Morbid obesity (BMI 45.16) 3.  Hypertension  PLAN: 1.  Copaxone 2.  D3 50,000 IU twice  weekly (take as directed) 3.  Repeat MRI of brain with and without contrast 4.  Repeat vitamin D level in 6 months (about a week prior to follow up) 5.  Exercise, weight loss, blood pressure control 6.  Follow up in 6 months.  25 minutes spent face to face with patient, over 50% spent discussing management.  Metta Clines, DO  CC: Mellody Dance, DO

## 2018-02-19 ENCOUNTER — Encounter: Payer: Self-pay | Admitting: Neurology

## 2018-02-19 ENCOUNTER — Ambulatory Visit (INDEPENDENT_AMBULATORY_CARE_PROVIDER_SITE_OTHER): Payer: BLUE CROSS/BLUE SHIELD | Admitting: Neurology

## 2018-02-19 VITALS — BP 130/92 | HR 107 | Ht 72.0 in | Wt 333.0 lb

## 2018-02-19 DIAGNOSIS — G35 Multiple sclerosis: Secondary | ICD-10-CM

## 2018-02-19 DIAGNOSIS — IMO0002 Reserved for concepts with insufficient information to code with codable children: Secondary | ICD-10-CM

## 2018-02-19 DIAGNOSIS — E10610 Type 1 diabetes mellitus with diabetic neuropathic arthropathy: Secondary | ICD-10-CM

## 2018-02-19 DIAGNOSIS — I10 Essential (primary) hypertension: Secondary | ICD-10-CM

## 2018-02-19 DIAGNOSIS — E1065 Type 1 diabetes mellitus with hyperglycemia: Secondary | ICD-10-CM

## 2018-02-19 NOTE — Patient Instructions (Signed)
1.  Continue Copaxone 2.  Continue D3 50,000 IU twice weekly 3.  Repeat MRI of brain with and without contrast 4.  Repeat vitamin D level in 6 months (about a week prior to follow up) 5.  Follow up in 6 months.

## 2018-02-20 ENCOUNTER — Telehealth: Payer: Self-pay | Admitting: Neurology

## 2018-02-20 NOTE — Telephone Encounter (Signed)
Called and spoke with Pt. I advised him I will send the request to Dr. Everlena Cooper about Valium, and he should check with his pharmacy tomorrow if he dose not hear from me

## 2018-02-20 NOTE — Telephone Encounter (Signed)
Yes.  He will need a driver to and from the MRI

## 2018-02-20 NOTE — Telephone Encounter (Signed)
Pt called states he had to take a medication to calm him before MRI last time he had one. Pt request provider call in medication for MRI scheduled 02/25/18.  Pt uses [facility].  Pt phone [phone].

## 2018-02-21 NOTE — Telephone Encounter (Signed)
Called and advised Pt, reminded him he must have a driver and to take the 5 mg tablet 30 min prior to imaging study start.  [facility], LMOVM

## 2018-02-23 ENCOUNTER — Encounter: Payer: Self-pay | Admitting: Endocrinology

## 2018-02-23 ENCOUNTER — Ambulatory Visit (INDEPENDENT_AMBULATORY_CARE_PROVIDER_SITE_OTHER): Payer: BLUE CROSS/BLUE SHIELD | Admitting: Endocrinology

## 2018-02-23 VITALS — BP 162/90 | HR 106 | Ht 72.0 in | Wt 331.8 lb

## 2018-02-23 DIAGNOSIS — E1065 Type 1 diabetes mellitus with hyperglycemia: Secondary | ICD-10-CM | POA: Diagnosis not present

## 2018-02-23 DIAGNOSIS — IMO0002 Reserved for concepts with insufficient information to code with codable children: Secondary | ICD-10-CM

## 2018-02-23 DIAGNOSIS — E10610 Type 1 diabetes mellitus with diabetic neuropathic arthropathy: Secondary | ICD-10-CM

## 2018-02-23 LAB — POCT GLYCOSYLATED HEMOGLOBIN (HGB A1C): Hemoglobin A1C: 7.3 % — AB (ref 4.0–5.6)

## 2018-02-23 NOTE — Patient Instructions (Addendum)
Your blood pressure is high today.  Please see your primary care provider soon, to have it rechecked Please take these pump settings:  basal rate of 0.4 units/hr, 24 HRS per day (when not in auto mode).   mealtime bolus of 1 unit/11 grams carbohydrate.  correction bolus (which some people call "sensitivity," or "insulin sensitivity ratio," or just "isr") of 1 unit for each 50 by which your glucose exceeds 120.  Please come back for a follow-up appointment in 3 months.

## 2018-02-23 NOTE — Progress Notes (Signed)
Subjective:    Patient ID: Michael Hutchinson, male    DOB: [birth year], 43 y.o.   MRN: [id number]  HPI Pt returns for f/u of diabetes mellitus: DM type: 1 Dx'ed: 4098 Complications: left BKA, polyneuropathy, renal insuff, PAD, and right charcot foot.  Therapy: insulin since dx, and pump rx ([id number]), since 2018.   DKA: once, in 2009 Severe hypoglycemia: once, in 2008.   Pancreatitis: never Pancreatic imaging: never Interval History: He takes these settings:  basal rate of 0.4 units/hr, 24 HRS per day (when not in "auto mode."     mealtime bolus of 1 unit/12 grams carbohydrate.  correction bolus (which some people call "sensitivity," or "insulin sensitivity ratio," or just "isr") of 1 unit for each 50 by which your glucose exceeds 120.   Total daily dosage: approx 60 units per day (mostly basal).  Our office has downloaded continuous glucose monitor data, and we reviewed together.  It varies from 59-270, but the vast majority are in the 100's.  There is little if any trend throughout the day. He stopped Ozempic, due to nausea and myalgias. Past Medical History:  Diagnosis Date  . Charcot's joint of foot 11/25/2013  . Complication of anesthesia    "I wake up angry" (12/31/2015)  . Diabetes mellitus type 1 ([place]) dx'd 1981  . Diabetic ketoacidosis ([place])   . Essential hypertension 05/03/2013  . GERD (gastroesophageal reflux disease)   . High cholesterol   . Hx MRSA infection    Inner thigh and under arm- healed areas  . Meniscus tear   . Shortness of breath    with exertion only    Past Surgical History:  Procedure Laterality Date  . AMPUTATION Left 01/01/2016   Procedure: AMPUTATION BELOW KNEE;  Surgeon: [name], MD;  Location: [place];  Service: Orthopedics;  Laterality: Left;  . APPLICATION OF WOUND VAC  04/26/2016  . FRACTURE SURGERY    . HERNIA REPAIR    . I&D EXTREMITY Left 04/26/2016   Procedure: IRRIGATION AND DEBRIDEMENT EXTREMITY/Left Leg/Possible Wound Vac;   Surgeon: [name], MD;  Location: [place];  Service: Orthopedics;  Laterality: Left;  . INCISE AND DRAIN ABCESS Left 04/26/2016  . KNEE ARTHROSCOPY Left ~ 2010  . LAPAROSCOPIC CHOLECYSTECTOMY  2015  . METACARPOPHALANGEAL JOINT ARTHRODESIS Left 06/2012   Fracture left index finger intra-articular MCP joint/notes 06/30/2012  . OPEN REDUCTION INTERNAL FIXATION (ORIF) PROXIMAL PHALANX Left 06/30/2012   Procedure: OPEN REDUCTION INTERNAL FIXATION (ORIF) LEFT INDEX FINGER PROXIMAL PHALANX FRACTURE WITH LIGAMENT REPAIR AS NECESSARY;  Surgeon: [name], MD;  Location: [place];  Service: Orthopedics;  Laterality: Left;  . UMBILICAL HERNIA REPAIR  2015   "w/gallbladder OR"    Social History   Socioeconomic History  . Marital status: Married    Spouse name: Estill Bamberg  . Number of children: 2  . Years of education: Not on file  . Highest education level: High school graduate  Occupational History  . Occupation: Dealer  Social Needs  . Financial resource strain: Not on file  . Food insecurity:    Worry: Not on file    Inability: Not on file  . Transportation needs:    Medical: Not on file    Non-medical: Not on file  Tobacco Use  . Smoking status: Never Smoker  . Smokeless tobacco: Former Systems developer    Types: Snuff, Chew  Substance and Sexual Activity  . Alcohol use: Yes    Alcohol/week: 14.0 standard drinks  Types: 14 Cans of beer per week  . Drug use: No  . Sexual activity: Yes  Lifestyle  . Physical activity:    Days per week: Not on file    Minutes per session: Not on file  . Stress: Not on file  Relationships  . Social connections:    Talks on phone: Not on file    Gets together: Not on file    Attends religious service: Not on file    Active member of club or organization: Not on file    Attends meetings of clubs or organizations: Not on file    Relationship status: Not on file  . Intimate partner violence:    Fear of current or ex partner: Not on file    Emotionally  abused: Not on file    Physically abused: Not on file    Forced sexual activity: Not on file  Other Topics Concern  . Not on file  Social History Narrative   Pt is a right handed married man, lives with his wife and 2 children ages 46 and 71, in a single story house. Pt drinks 6-8, 16oz diet sodas a day.     Current Outpatient Medications on File Prior to Visit  Medication Sig Dispense Refill  . aspirin 81 MG chewable tablet Chew 1 tablet (81 mg total) by mouth daily. 30 tablet 0  . BAYER MICROLET LANCETS lancets Use as instructed to check 3 times daily. 100 each 12  . Blood Glucose Monitoring Suppl (ONE TOUCH ULTRA SYSTEM KIT) w/Device KIT 1 kit by Does not apply route once. Check blood sugar 3 times daily. Diagnosis Diabetes ICD-10 E11.8    . etodolac (LODINE) 400 MG tablet etodolac 400 mg tablet    . ezetimibe (ZETIA) 10 MG tablet Take 1 tablet (10 mg total) by mouth at bedtime. 90 tablet 1  . Glatiramer Acetate (COPAXONE) 40 MG/ML SOSY Inject 40 mg into the skin every Monday, Wednesday, and Friday. 12 Syringe 3  . Glatiramer Acetate 40 MG/ML SOSY Inject 40 mg into the skin as directed. Monday, Wednesday and Friday 12 Syringe 11  . glucagon (GLUCAGON EMERGENCY) 1 MG injection Inject 1 mg into the muscle once as needed. (Patient taking differently: Inject 1 mg into the muscle daily as needed (hypoglycemia). ) 1 each 11  . glucose blood (BAYER CONTOUR NEXT TEST) test strip 1 each by Other route 4 (four) times daily. And lancets 4/day 400 each 3  . HYDROcodone-acetaminophen (NORCO/VICODIN) 5-325 MG tablet hydrocodone 5 mg-acetaminophen 325 mg tablet    . ibuprofen (ADVIL,MOTRIN) 800 MG tablet Take 1 tablet (800 mg total) by mouth 3 (three) times daily. 21 tablet 0  . insulin lispro (HUMALOG) 100 UNIT/ML injection Use 60 units a day via insulin pump. 3 vial 11  . losartan-hydrochlorothiazide (HYZAAR) 50-12.5 MG tablet TAKE 1/2 TABLET BY MOUTH TWICE A DAY 90 tablet 0  . montelukast (SINGULAIR)  10 MG tablet Take 1 tablet (10 mg total) by mouth at bedtime. 30 tablet 3  . omeprazole (PRILOSEC) 40 MG capsule Take 1 capsule (40 mg total) by mouth daily. 30 capsule 3  . ondansetron (ZOFRAN-ODT) 8 MG disintegrating tablet Take 1 tablet (8 mg total) by mouth every 8 (eight) hours as needed for nausea or vomiting. 20 tablet 0  . rosuvastatin (CRESTOR) 10 MG tablet rosuvastatin 10 mg tablet    . Vitamin D, Ergocalciferol, (DRISDOL) 50000 units CAPS capsule 1 tab every Wednesday and Sunday 24 capsule 10   No  current facility-administered medications on file prior to visit.     Allergies  Allergen Reactions  . Influenza Virus Vaccine Split Nausea And Vomiting  . Hydrochlorothiazide Other (See Comments)    Dizziness  . Lisinopril Cough  . Losartan Other (See Comments)    Dizziness    Family History  Problem Relation Age of Onset  . Other Mother   . Cancer Mother        Breast / Bone  . Heart attack Father   . Hypertension Father   . Hyperlipidemia Father   . Diabetes Unknown   . Alcohol abuse Sister   . Diabetes Maternal Grandfather   . Stroke Paternal Grandmother   . Alcohol abuse Paternal Grandfather     BP (!) 162/90 (BP Location: Right Arm, Patient Position: Sitting, Cuff Size: Large) Comment: did not take antihyertensive  Pulse (!) 106   Ht 6' (1.829 m)   Wt (!) 331 lb 12.8 oz (150.5 kg)   SpO2 97%   BMI 45.00 kg/m    Review of Systems Denies LOC    Objective:   Physical Exam GENERAL: no distress Pulses: right foot pulses are intact.   MSK: no deformity of the right foot or ankle.   CV: 1+ edema of the right leg. Skin:  no ulcer on the right foot or ankle.  normal temp on the feet and ankles.  There is hyperpigmentation of the right foot.   Neuro: sensation is intact to touch on the feet and ankle, but severely decreased from normal.  Ext: left BKA  Right ankle and foot deformities are again noted.   Lab Results  Component Value Date   HGBA1C 7.3 (A)  02/23/2018        Assessment & Plan:  HTN: is noted today Type 1 DM, with PAD: he needs increased rx Myalgias: this limits rx options.    Patient Instructions  Your blood pressure is high today.  Please see your primary care provider soon, to have it rechecked Please take these pump settings:  basal rate of 0.4 units/hr, 24 HRS per day (when not in auto mode).   mealtime bolus of 1 unit/11 grams carbohydrate.  correction bolus (which some people call "sensitivity," or "insulin sensitivity ratio," or just "isr") of 1 unit for each 50 by which your glucose exceeds 120.  Please come back for a follow-up appointment in 3 months.

## 2018-02-25 ENCOUNTER — Other Ambulatory Visit: Payer: BLUE CROSS/BLUE SHIELD

## 2018-02-25 ENCOUNTER — Ambulatory Visit
Admission: RE | Admit: 2018-02-25 | Discharge: 2018-02-25 | Disposition: A | Discharge status: Home / Self Care | Payer: BLUE CROSS/BLUE SHIELD | Source: Ambulatory Visit | Attending: Neurology | Admitting: Neurology

## 2018-02-25 DIAGNOSIS — G35 Multiple sclerosis: Secondary | ICD-10-CM

## 2018-02-25 MED ORDER — GADOBENATE DIMEGLUMINE 529 MG/ML IV SOLN
20.0000 mL | Freq: Once | INTRAVENOUS | Status: AC | PRN
  Administered 2018-02-25: 20 mL via INTRAVENOUS

## 2018-03-02 ENCOUNTER — Telehealth: Payer: Self-pay

## 2018-03-02 NOTE — Telephone Encounter (Signed)
Called and advised Pt of MRI results 

## 2018-03-02 NOTE — Telephone Encounter (Signed)
-----   Message from Drema Dallas, DO sent at 02/26/2018  8:53 AM EST ----- MRI of brain is stable.  No new changes.

## 2018-03-05 ENCOUNTER — Other Ambulatory Visit: Payer: Self-pay | Admitting: Endocrinology

## 2018-03-13 DIAGNOSIS — E109 Type 1 diabetes mellitus without complications: Secondary | ICD-10-CM | POA: Diagnosis not present

## 2018-03-13 DIAGNOSIS — Z794 Long term (current) use of insulin: Secondary | ICD-10-CM | POA: Diagnosis not present

## 2018-03-20 ENCOUNTER — Telehealth: Payer: Self-pay | Admitting: Nutrition

## 2018-03-20 DIAGNOSIS — Z794 Long term (current) use of insulin: Secondary | ICD-10-CM | POA: Diagnosis not present

## 2018-03-20 DIAGNOSIS — E109 Type 1 diabetes mellitus without complications: Secondary | ICD-10-CM | POA: Diagnosis not present

## 2018-03-20 NOTE — Telephone Encounter (Signed)
Patient called saying that he can not meet with Kathi Simpers until the 24th, and would like to back on injections.   Before starting on the pump, he was on Lantus40u qd, and Humalog ac meals(36u/day).  Last pump settings is 0.4uqhr, and  1u/11g carb Please advise--big difference !

## 2018-03-20 NOTE — Telephone Encounter (Signed)
Patient reports that he is having a lot of problems with his [employer] pump and sensors.  Says every time he changes his infusion set and cartridge, his blood sugars go over 400 for several hours, despite priming the tubing and giving a fill canula amount of .9u.  Also says sensors are off by 100 or more points and he wants to go off pump. He is "fed up, and not wanting to go back on injections, but does not see what else he can do".  He did not want to come in for another visit with me to review reservoir and infusion set insertions.   I told him that I would have the Medtronic educator--Lou Tobi Bastos,  contact him, and see if she can see what he may be doing that are causing these difficulties, if he wanted.  He agreed to this. I contacted Kathi Simpers, and left her a voice mail to call the patient ASAP.

## 2018-03-20 NOTE — Telephone Encounter (Signed)
LVM requesting returned call 

## 2018-03-20 NOTE — Telephone Encounter (Signed)
Message left on my voice mail that Michael Hutchinson is seeing this patient tomorrow.  I left him a voice mail requesting that he stay on his pump until after seeing [name] tomorrow, and if he still wants to go off the pump, he will need to take 30u of Lantus once daily and 12u ac meals.  I asked him to call me tomorrow to let me know what he has decided to do.

## 2018-03-20 NOTE — Telephone Encounter (Signed)
Ok.  Please resume lantus at 30 units/d, and humalog 12 units 3 times a day (just before each meal).  Please call or message [place] in a few days, to tell us how the blood sugar is doing

## 2018-03-28 DIAGNOSIS — M25511 Pain in right shoulder: Secondary | ICD-10-CM | POA: Diagnosis not present

## 2018-04-02 ENCOUNTER — Other Ambulatory Visit: Payer: Self-pay | Admitting: Family Medicine

## 2018-04-02 DIAGNOSIS — E1159 Type 2 diabetes mellitus with other circulatory complications: Secondary | ICD-10-CM

## 2018-04-02 DIAGNOSIS — I10 Essential (primary) hypertension: Principal | ICD-10-CM

## 2018-04-02 DIAGNOSIS — I152 Hypertension secondary to endocrine disorders: Secondary | ICD-10-CM

## 2018-04-03 ENCOUNTER — Other Ambulatory Visit: Payer: Self-pay | Admitting: Endocrinology

## 2018-04-03 ENCOUNTER — Other Ambulatory Visit: Payer: Self-pay

## 2018-04-03 DIAGNOSIS — E1159 Type 2 diabetes mellitus with other circulatory complications: Secondary | ICD-10-CM

## 2018-04-03 DIAGNOSIS — I10 Essential (primary) hypertension: Principal | ICD-10-CM

## 2018-04-03 DIAGNOSIS — I152 Hypertension secondary to endocrine disorders: Secondary | ICD-10-CM

## 2018-04-03 MED ORDER — LOSARTAN POTASSIUM-HCTZ 50-12.5 MG PO TABS: ORAL_TABLET | ORAL | 0 refills | Status: DC

## 2018-04-03 MED ORDER — INSULIN ASPART 100 UNIT/ML ~~LOC~~ SOLN: SUBCUTANEOUS | 3 refills | Status: DC

## 2018-04-07 DIAGNOSIS — M25511 Pain in right shoulder: Secondary | ICD-10-CM | POA: Diagnosis not present

## 2018-04-09 DIAGNOSIS — M25511 Pain in right shoulder: Secondary | ICD-10-CM | POA: Diagnosis not present

## 2018-04-09 DIAGNOSIS — M67813 Other specified disorders of tendon, right shoulder: Secondary | ICD-10-CM | POA: Insufficient documentation

## 2018-05-24 ENCOUNTER — Encounter: Payer: Self-pay | Admitting: Endocrinology

## 2018-05-25 ENCOUNTER — Other Ambulatory Visit: Payer: Self-pay

## 2018-05-25 ENCOUNTER — Ambulatory Visit (INDEPENDENT_AMBULATORY_CARE_PROVIDER_SITE_OTHER): Payer: BLUE CROSS/BLUE SHIELD | Admitting: Endocrinology

## 2018-05-25 DIAGNOSIS — E10610 Type 1 diabetes mellitus with diabetic neuropathic arthropathy: Secondary | ICD-10-CM

## 2018-05-25 DIAGNOSIS — IMO0002 Reserved for concepts with insufficient information to code with codable children: Secondary | ICD-10-CM

## 2018-05-25 DIAGNOSIS — E1065 Type 1 diabetes mellitus with hyperglycemia: Secondary | ICD-10-CM

## 2018-05-25 MED ORDER — INSULIN LISPRO 100 UNIT/ML ~~LOC~~ SOLN: SUBCUTANEOUS | 3 refills | Status: DC

## 2018-05-25 MED ORDER — INSULIN ASPART 100 UNIT/ML ~~LOC~~ SOLN: SUBCUTANEOUS | 3 refills | Status: DC

## 2018-05-25 NOTE — Patient Instructions (Addendum)
Please take these pump settings:  basal rate of 0.4 units/hr, 24 HRS per day (when not in auto mode).   mealtime bolus of 1 unit/11 grams carbohydrate.  correction bolus (which some people call "sensitivity," or "insulin sensitivity ratio," or just "isr") of 1 unit for each 50 by which your glucose exceeds 120.  Please come in to have the a1c checked Please come back for a follow-up appointment in 3 months.

## 2018-05-25 NOTE — Progress Notes (Signed)
Subjective:    Patient ID: Michael Hutchinson, male    DOB: 1975-07-30, 43 y.o.   MRN: [id number]  HPI  telehealth visit today via doxy video visit.  Alternatives to telehealth are presented to this patient, and the patient agrees to the telehealth visit. Pt is advised of the cost of the visit, and agrees to this, also.   Patient is at home, and I am at the office.   Persons attending the telehealth visit: the patient and I Pt returns for f/u of diabetes mellitus: DM type: 1 Dx'ed: 1103 Complications: left BKA, polyneuropathy, renal insuff, PAD, and right charcot foot.  Therapy: insulin since dx, and pump rx ([id number] 924), since 2018.   DKA: once, in 2009 Severe hypoglycemia: once, in 2008.   Pancreatitis: never Pancreatic imaging: never Interval History: He takes these settings:  Please take these pump settings:  basal rate of 0.4 units/hr, 24 HRS per day (when not in auto mode).   mealtime bolus of 1 unit/11 grams carbohydrate.  correction bolus (which some people call "sensitivity," or "insulin sensitivity ratio," or just "isr") of 1 unit for each 50 by which your glucose exceeds 120.  He seldom has hypoglycemia, and these episodes are mild.  This happens at HS.  He says cbg is still highest after meals.  He says most cbg's are in the low to mid-100's.  He says TDD is approx 65 units.   Past Medical History:  Diagnosis Date  . Charcot's joint of foot 11/25/2013  . Complication of anesthesia    "I wake up angry" (12/31/2015)  . Diabetes mellitus type 1 ([place]) dx'd 1981  . Diabetic ketoacidosis ([place])   . Essential hypertension 05/03/2013  . GERD (gastroesophageal reflux disease)   . High cholesterol   . Hx MRSA infection    Inner thigh and under arm- healed areas  . Meniscus tear   . Shortness of breath    with exertion only    Past Surgical History:  Procedure Laterality Date  . AMPUTATION Left 01/01/2016   Procedure: AMPUTATION BELOW KNEE;  Surgeon: [name], MD;   Location: [place];  Service: Orthopedics;  Laterality: Left;  . APPLICATION OF WOUND VAC  04/26/2016  . FRACTURE SURGERY    . HERNIA REPAIR    . I&D EXTREMITY Left 04/26/2016   Procedure: IRRIGATION AND DEBRIDEMENT EXTREMITY/Left Leg/Possible Wound Vac;  Surgeon: [name], MD;  Location: [place];  Service: Orthopedics;  Laterality: Left;  . INCISE AND DRAIN ABCESS Left 04/26/2016  . KNEE ARTHROSCOPY Left ~ 2010  . LAPAROSCOPIC CHOLECYSTECTOMY  2015  . METACARPOPHALANGEAL JOINT ARTHRODESIS Left 06/2012   Fracture left index finger intra-articular MCP joint/notes 06/30/2012  . OPEN REDUCTION INTERNAL FIXATION (ORIF) PROXIMAL PHALANX Left 06/30/2012   Procedure: OPEN REDUCTION INTERNAL FIXATION (ORIF) LEFT INDEX FINGER PROXIMAL PHALANX FRACTURE WITH LIGAMENT REPAIR AS NECESSARY;  Surgeon: [name], MD;  Location: [place];  Service: Orthopedics;  Laterality: Left;  . UMBILICAL HERNIA REPAIR  2015   "w/gallbladder OR"    Social History   Socioeconomic History  . Marital status: Married    Spouse name: Estill Bamberg  . Number of children: 2  . Years of education: Not on file  . Highest education level: High school graduate  Occupational History  . Occupation: Dealer  Social Needs  . Financial resource strain: Not on file  . Food insecurity:    Worry: Not on file    Inability: Not on file  . Transportation needs:  Medical: Not on file    Non-medical: Not on file  Tobacco Use  . Smoking status: Never Smoker  . Smokeless tobacco: Former Systems developer    Types: Snuff, Chew  Substance and Sexual Activity  . Alcohol use: Yes    Alcohol/week: 14.0 standard drinks    Types: 14 Cans of beer per week  . Drug use: No  . Sexual activity: Yes  Lifestyle  . Physical activity:    Days per week: Not on file    Minutes per session: Not on file  . Stress: Not on file  Relationships  . Social connections:    Talks on phone: Not on file    Gets together: Not on file    Attends religious service: Not  on file    Active member of club or organization: Not on file    Attends meetings of clubs or organizations: Not on file    Relationship status: Not on file  . Intimate partner violence:    Fear of current or ex partner: Not on file    Emotionally abused: Not on file    Physically abused: Not on file    Forced sexual activity: Not on file  Other Topics Concern  . Not on file  Social History Narrative   Pt is a right handed married man, lives with his wife and 2 children ages 79 and 9, in a single story house. Pt drinks 6-8, 16oz diet sodas a day.     Current Outpatient Medications on File Prior to Visit  Medication Sig Dispense Refill  . aspirin 81 MG chewable tablet Chew 1 tablet (81 mg total) by mouth daily. 30 tablet 0  . BAYER MICROLET LANCETS lancets Use as instructed to check 3 times daily. 100 each 12  . Blood Glucose Monitoring Suppl (ONE TOUCH ULTRA SYSTEM KIT) w/Device KIT 1 kit by Does not apply route once. Check blood sugar 3 times daily. Diagnosis Diabetes ICD-10 E11.8    . etodolac (LODINE) 400 MG tablet etodolac 400 mg tablet    . ezetimibe (ZETIA) 10 MG tablet Take 1 tablet (10 mg total) by mouth at bedtime. 90 tablet 1  . Glatiramer Acetate (COPAXONE) 40 MG/ML SOSY Inject 40 mg into the skin every Monday, Wednesday, and Friday. 12 Syringe 3  . Glatiramer Acetate 40 MG/ML SOSY Inject 40 mg into the skin as directed. Monday, Wednesday and Friday 12 Syringe 11  . glucagon (GLUCAGON EMERGENCY) 1 MG injection Inject 1 mg into the muscle once as needed. (Patient taking differently: Inject 1 mg into the muscle daily as needed (hypoglycemia). ) 1 each 11  . glucose blood (BAYER CONTOUR NEXT TEST) test strip 1 each by Other route 4 (four) times daily. And lancets 4/day 400 each 3  . HYDROcodone-acetaminophen (NORCO/VICODIN) 5-325 MG tablet hydrocodone 5 mg-acetaminophen 325 mg tablet    . ibuprofen (ADVIL,MOTRIN) 800 MG tablet Take 1 tablet (800 mg total) by mouth 3 (three) times  daily. 21 tablet 0  . losartan (COZAAR) 50 MG tablet TAKE 1/2 TABLET BY MOUTH TWICE A DAY TAKE WITH HYDROCHLOROTHIAZIDE 12.5 MG TABLET COMBINATION TABLET UNAVAILABLE 90 tablet 0  . losartan-hydrochlorothiazide (HYZAAR) 50-12.5 MG tablet TAKE 1/2 TABLET BY MOUTH TWICE A DAY 90 tablet 0  . montelukast (SINGULAIR) 10 MG tablet Take 1 tablet (10 mg total) by mouth at bedtime. 30 tablet 3  . omeprazole (PRILOSEC) 40 MG capsule Take 1 capsule (40 mg total) by mouth daily. 30 capsule 3  . ondansetron (ZOFRAN-ODT)  8 MG disintegrating tablet Take 1 tablet (8 mg total) by mouth every 8 (eight) hours as needed for nausea or vomiting. 20 tablet 0  . rosuvastatin (CRESTOR) 10 MG tablet rosuvastatin 10 mg tablet    . Vitamin D, Ergocalciferol, (DRISDOL) 50000 units CAPS capsule 1 tab every Wednesday and Sunday 24 capsule 10   No current facility-administered medications on file prior to visit.     Allergies  Allergen Reactions  . Influenza Virus Vaccine Split Nausea And Vomiting  . Hydrochlorothiazide Other (See Comments)    Dizziness  . Lisinopril Cough  . Losartan Other (See Comments)    Dizziness    Family History  Problem Relation Age of Onset  . Other Mother   . Cancer Mother        Breast / Bone  . Heart attack Father   . Hypertension Father   . Hyperlipidemia Father   . Diabetes Unknown   . Alcohol abuse Sister   . Diabetes Maternal Grandfather   . Stroke Paternal Grandmother   . Alcohol abuse Paternal Grandfather     Review of Systems Denies LOC    Objective:   Physical Exam       Assessment & Plan:  Type 1 DM, with PAD: apparently well-controlled.   Hypoglycemia: This limits aggressiveness of glycemic control.    Patient Instructions  Please take these pump settings:  basal rate of 0.4 units/hr, 24 HRS per day (when not in auto mode).   mealtime bolus of 1 unit/11 grams carbohydrate.  correction bolus (which some people call "sensitivity," or "insulin sensitivity  ratio," or just "isr") of 1 unit for each 50 by which your glucose exceeds 120.  Please come in to have the a1c checked Please come back for a follow-up appointment in 3 months.

## 2018-06-01 ENCOUNTER — Telehealth: Payer: Self-pay

## 2018-06-01 NOTE — Telephone Encounter (Signed)
Received notification from Cover My Meds that a PA was required for Novolog. Received letter from [facility] stating authorization is not required for this medication on this member's plan. Per pharmacy system the pt filled this request on 05/18/18. No further action is required. Letter labeled and placed in scan file for HIM and for our future reference.

## 2018-06-05 ENCOUNTER — Encounter: Payer: Self-pay | Admitting: Family Medicine

## 2018-06-05 ENCOUNTER — Ambulatory Visit (INDEPENDENT_AMBULATORY_CARE_PROVIDER_SITE_OTHER): Payer: BLUE CROSS/BLUE SHIELD | Admitting: Family Medicine

## 2018-06-05 ENCOUNTER — Other Ambulatory Visit: Payer: Self-pay

## 2018-06-05 VITALS — Ht 72.0 in | Wt 330.0 lb

## 2018-06-05 DIAGNOSIS — E1169 Type 2 diabetes mellitus with other specified complication: Secondary | ICD-10-CM

## 2018-06-05 DIAGNOSIS — E786 Lipoprotein deficiency: Secondary | ICD-10-CM

## 2018-06-05 DIAGNOSIS — I10 Essential (primary) hypertension: Secondary | ICD-10-CM

## 2018-06-05 DIAGNOSIS — E1069 Type 1 diabetes mellitus with other specified complication: Secondary | ICD-10-CM | POA: Diagnosis not present

## 2018-06-05 DIAGNOSIS — I152 Hypertension secondary to endocrine disorders: Secondary | ICD-10-CM

## 2018-06-05 DIAGNOSIS — E1159 Type 2 diabetes mellitus with other circulatory complications: Secondary | ICD-10-CM

## 2018-06-05 DIAGNOSIS — E782 Mixed hyperlipidemia: Secondary | ICD-10-CM

## 2018-06-05 DIAGNOSIS — K219 Gastro-esophageal reflux disease without esophagitis: Secondary | ICD-10-CM

## 2018-06-05 DIAGNOSIS — Z91199 Patient's noncompliance with other medical treatment and regimen due to unspecified reason: Secondary | ICD-10-CM

## 2018-06-05 DIAGNOSIS — Z9119 Patient's noncompliance with other medical treatment and regimen: Secondary | ICD-10-CM

## 2018-06-05 DIAGNOSIS — E1065 Type 1 diabetes mellitus with hyperglycemia: Secondary | ICD-10-CM

## 2018-06-05 MED ORDER — LOSARTAN POTASSIUM 50 MG PO TABS: ORAL_TABLET | ORAL | 0 refills | Status: DC

## 2018-06-05 MED ORDER — OMEPRAZOLE 20 MG PO CPDR: DELAYED_RELEASE_CAPSULE | ORAL | 0 refills | Status: DC

## 2018-06-05 MED ORDER — HYDROCHLOROTHIAZIDE 25 MG PO TABS: 25.0000 mg | ORAL_TABLET | Freq: Every day | ORAL | 0 refills | Status: DC

## 2018-06-05 NOTE — Progress Notes (Signed)
Telehealth office visit note for Michael Hutchinson, D.O- at [facility] at [facility] ([facility])   I connected with current patient today and verified that I am speaking with the correct person using two identifiers.   . Location of the patient: Home . Location of the provider: Office Only the patient (+/- their family members at pt's discretion) and myself were participating in the encounter    - This visit type was conducted due to national recommendations for restrictions regarding the COVID-19 Pandemic (e.g. social distancing) in an effort to limit this patient's exposure and mitigate transmission in our community.  This format is felt to be most appropriate for this patient at this time.   - The patient did not have access to video technology or had technical difficulties with video requiring transitioning to audio format only. - No physical exam could be performed with this format, beyond that communicated to [place] by the patient/ family members as noted.   - Additionally my office staff/ schedulers discussed with the patient that there may be a monetary charge related to this service, depending on their medical insurance.   The patient expressed understanding, and agreed to proceed.       History of Present Illness:  Severe heartburn 2 wks ago started- was QOD initially and now daily.  Feels like "hot nastiness coming up", gross "death" taste in his mouth.  Eating makes it worse/ come on esp spicy.    Worse when lying down at night- even wakes him up from sleep occ.   Pt denies exertional sx- the pain doesn't get worse with walking / activity but it does with lying down.  Tried "OTC stuff for it."  The over-the-counter medicines worked well initially but are short lived and always gets worse when he lays down in bed at night.  Patient was last given his Prilosec 40 mg on 09/20/2017 by Faith Rogue.  He was given 30 pills with 3 refills.   He was told to follow-up and never did.  Pt never had  history of gastric or duodenal ulcer, no barrett's or known esophagitis etc.  BP:  -Patient has a difficult time cutting his hydrochlorothiazide in half as well as his losartan.  He is wondering if he can take the full pill once daily.  He has not been checking his blood pressures and last time it was checked back in early February at his endocrinologist it was high because patient states he "never took his meds that day."  Patient denies any lightheadedness, headache, dizziness etc.   Impression and Recommendations:    1. Hypertension associated with diabetes ([place])   2. Mixed diabetic hyperlipidemia associated with type 2 diabetes mellitus ([place])   3. Mixed diabetic hyperlipidemia associated with type 1 diabetes mellitus (HCC)   4. Obesity, Class III, BMI 40-49.9 (morbid obesity) ([place])   5. Low HDL (under 40)   6. Patient's noncompliance with other medical treatment and regimen   7. Type 1 diabetes mellitus with hyperglycemia (HCC)   8. Gastroesophageal reflux disease, esophagitis presence not specified     - f/up office visit with me 4 wks- restarted pt on omeprazole the 20 twice a day for 4 weeks then will go to once daily -GERD prudent diet highly recommended. -Reviewed red flag symptoms of how GERD can sometimes mimic CAD.   Component to his symptoms or the red flags we discussed, he will call immediately or dial 911 - WE are Switching his  hydrochlorothiazide and losartan from a half a tablet twice a day to 1 tablet daily.  We are making this change because patient states he cannot cut the blood pressure pill in half and hence many times just does not take it -He continues to be noncompliant with checking his blood pressures, taking medicines etc.  We discussed the detriment to his health and not following physician recommendations -Twice daily check your blood pressures and your pulses and write it down and then bring in a blood pressure log so we can go over it in 4 weeks -Highly  encouraged to be checking his blood sugars frequently as well.  He is managed by Dr. Loanne Drilling of endocrinology and is on an insulin pump. -Told patient he will need full set of fasting blood work in the near future=-3 days prior to his 4-week follow-up with me.    - As part of my medical decision making, I reviewed the following data within the [place] History obtained from pt /family, CMA notes reviewed and incorporated if applicable, Labs reviewed, Radiograph/ tests reviewed if applicable and OV notes from prior OV's with me, as well as other specialists she/he has seen since seeing me last, were all reviewed and used in my medical decision making process today.   - Additionally, discussion had with patient regarding txmnt plan, and their biases/concerns about that plan were used in my medical decision making today.   - The patient agreed with the plan and demonstrated an understanding of the instructions.   No barriers to understanding were identified.   - Red flag symptoms and signs discussed in detail.  Patient expressed understanding regarding what to do in case of emergency\ urgent symptoms.  The patient was advised to call back or seek an in-person evaluation if the symptoms worsen or if the condition fails to improve as anticipated.   Return for 4 weeks OV with full set FBW 3 days prior. -Asked CMA to please place them.   Meds ordered this encounter  Medications  . omeprazole (PRILOSEC) 20 MG capsule    Sig: Take 20 mg twice daily for 4 weeks then once daily    Dispense:  60 capsule    Refill:  0  . losartan (COZAAR) 50 MG tablet    Sig: 1 po qd    Dispense:  30 tablet    Refill:  0  . hydrochlorothiazide (HYDRODIURIL) 25 MG tablet    Sig: Take 1 tablet (25 mg total) by mouth daily.    Dispense:  30 tablet    Refill:  0    Medications Discontinued During This Encounter  Medication Reason  . ondansetron (ZOFRAN-ODT) 8 MG disintegrating tablet Completed Course   . omeprazole (PRILOSEC) 40 MG capsule   . losartan-hydrochlorothiazide (HYZAAR) 50-12.5 MG tablet   . losartan (COZAAR) 50 MG tablet       I provided 18 minutes of non-face-to-face time during this encounter,with over 50% of the time in direct counseling on patients medical conditions/ medical concerns.  Additional time was spent with charting and coordination of care after the actual visit commenced.   Note:  This note was prepared with assistance of Dragon voice recognition software. Occasional wrong-word or sound-a-like substitutions may have occurred due to the inherent limitations of voice recognition software.  Michael Dance, DO     Patient Care Team    Relationship Specialty Notifications Start End  Michael Dance, DO PCP - General Family Medicine All results, Admissions 12/31/15  Fayette Pho, PA Attending Physician Orthopedic Surgery  06/01/16   [name], MD Consulting Physician Orthopedic Surgery  07/04/16   Campbell Riches, MD Consulting Physician Infectious Diseases  07/04/16   Pieter Partridge, DO Consulting Physician Neurology  05/29/17   Renato Shin, MD Consulting Physician Endocrinology  05/29/17    Comment: Treatment of diabetes  Philemon Kingdom, MD Consulting Physician Internal Medicine  07/11/17    Comment: Endocrinologist     -Vitals obtained; medications/ allergies reconciled;  personal medical, social, Sx etc.histories were updated by CMA, reviewed by me and are reflected in chart   Patient Active Problem List   Diagnosis Date Noted  . Multiple sclerosis ([place]) 07/11/2017    Priority: High  . Hx of BKA, left - with subsequent cellulitis and treatment for osteomylitis 06/01/2016    Priority: High  . Obesity, Class III, BMI 40-49.9 (morbid obesity) ([place]) 09/22/2015    Priority: High  . Diabetes mellitus type 1 (HCC)     Priority: High  . Hypertension associated with diabetes ([place]) 05/03/2013    Priority: High  . Mixed diabetic hyperlipidemia  associated with type 1 diabetes mellitus ([place]) 07/26/2012    Priority: High  . Low HDL (under 40) 09/22/2015    Priority: Medium  . GERD (gastroesophageal reflux disease)     Priority: Medium  . Hx MRSA infection 09/22/2015    Priority: Low  . Tendinosis of right shoulder 04/09/2018  . Pain in joint of right shoulder 02/14/2018  . Burning in the chest 09/20/2017  . Statin intolerance 08/08/2017  . Low hemoglobin 07/11/2017  . Lateral epicondylitis of right elbow 03/20/2017  . Pain in joint of right elbow 03/20/2017  . Chronic fatigue 07/04/2016  . Impaired exercise tolerance 07/04/2016  . Acute left otitis media 06/09/2016  . Abscess of left leg 04/26/2016  . Diabetes mellitus with complication ([place])   . Normocytic anemia 01/01/2016  . Osteomyelitis ([place]) 12/31/2015  . Diabetic ulcer of left heel associated with type 1 diabetes mellitus, with necrosis of bone ([place]) 12/31/2015  . Ankle wound 12/31/2015  . Chronic osteomyelitis of left ankle with draining sinus ([place])   . Type 1 diabetes, uncontrolled, with Charcot's joint of foot ([place]) 10/12/2015  . H/O noncompliance with medical treatment, presenting hazards to health 09/22/2015  . Vitamin D deficiency 09/22/2015  . Charcot's joint of foot 11/25/2013  . Diabetic ketoacidosis ([place])   . Meniscus tear   . Epigastric pain 12/10/2011  . Vomiting 12/10/2011  . Problems influencing health status 11/21/2011     Current Meds  Medication Sig  . aspirin 81 MG chewable tablet Chew 1 tablet (81 mg total) by mouth daily.  Marland Kitchen BAYER MICROLET LANCETS lancets Use as instructed to check 3 times daily.  . Blood Glucose Monitoring Suppl (ONE TOUCH ULTRA SYSTEM KIT) w/Device KIT 1 kit by Does not apply route once. Check blood sugar 3 times daily. Diagnosis Diabetes ICD-10 E11.8  . etodolac (LODINE) 400 MG tablet etodolac 400 mg tablet  . ezetimibe (ZETIA) 10 MG tablet Take 1 tablet (10 mg total) by mouth at bedtime.  . Glatiramer Acetate  (COPAXONE) 40 MG/ML SOSY Inject 40 mg into the skin every Monday, Wednesday, and Friday.  . Glatiramer Acetate 40 MG/ML SOSY Inject 40 mg into the skin as directed. Monday, Wednesday and Friday  . glucagon (GLUCAGON EMERGENCY) 1 MG injection Inject 1 mg into the muscle once as needed. (Patient taking differently: Inject 1 mg into the muscle daily as needed (  hypoglycemia). )  . glucose blood (BAYER CONTOUR NEXT TEST) test strip 1 each by Other route 4 (four) times daily. And lancets 4/day  . HYDROcodone-acetaminophen (NORCO/VICODIN) 5-325 MG tablet hydrocodone 5 mg-acetaminophen 325 mg tablet  . ibuprofen (ADVIL,MOTRIN) 800 MG tablet Take 1 tablet (800 mg total) by mouth 3 (three) times daily.  . insulin lispro (HUMALOG) 100 UNIT/ML injection USE 70 UNITS A DAY VIA INSULIN PUMP.  Marland Kitchen losartan (COZAAR) 50 MG tablet 1 po qd  . montelukast (SINGULAIR) 10 MG tablet Take 1 tablet (10 mg total) by mouth at bedtime.  . rosuvastatin (CRESTOR) 10 MG tablet rosuvastatin 10 mg tablet  . Vitamin D, Ergocalciferol, (DRISDOL) 50000 units CAPS capsule 1 tab every Wednesday and Sunday  . [DISCONTINUED] losartan (COZAAR) 50 MG tablet TAKE 1/2 TABLET BY MOUTH TWICE A DAY TAKE WITH HYDROCHLOROTHIAZIDE 12.5 MG TABLET COMBINATION TABLET UNAVAILABLE  . [DISCONTINUED] losartan-hydrochlorothiazide (HYZAAR) 50-12.5 MG tablet TAKE 1/2 TABLET BY MOUTH TWICE A DAY     Allergies:  Allergies  Allergen Reactions  . Influenza Virus Vaccine Split Nausea And Vomiting  . Hydrochlorothiazide Other (See Comments)    Dizziness  . Lisinopril Cough  . Losartan Other (See Comments)    Dizziness     ROS:  See above HPI for pertinent positives and negatives   Objective:   Height 6' (1.829 m), weight (!) 330 lb (149.7 kg).  (if some vitals are omitted, this means that patient was UNABLE to obtain them even though they were asked to get them prior to OV today.  They were asked to call us at their earliest convenience with these  once obtained. )  General: A & O * 3; sounds in no acute distress; in usual state of health.  Skin: Pt confirms warm and dry extremities and pink fingertips HEENT: Pt confirms lips non-cyanotic Chest: Patient confirms normal chest excursion and movement Respiratory: speaking in full sentences, no conversational dyspnea; patient confirms no use of accessory muscles Psych: insight appears good, mood- appears full

## 2018-06-12 ENCOUNTER — Other Ambulatory Visit: Payer: Self-pay

## 2018-06-12 DIAGNOSIS — E1159 Type 2 diabetes mellitus with other circulatory complications: Secondary | ICD-10-CM

## 2018-06-12 DIAGNOSIS — R5382 Chronic fatigue, unspecified: Secondary | ICD-10-CM

## 2018-06-12 DIAGNOSIS — E786 Lipoprotein deficiency: Secondary | ICD-10-CM

## 2018-06-12 DIAGNOSIS — IMO0002 Reserved for concepts with insufficient information to code with codable children: Secondary | ICD-10-CM

## 2018-06-12 DIAGNOSIS — I152 Hypertension secondary to endocrine disorders: Secondary | ICD-10-CM

## 2018-06-12 DIAGNOSIS — E1065 Type 1 diabetes mellitus with hyperglycemia: Secondary | ICD-10-CM

## 2018-06-12 DIAGNOSIS — E10610 Type 1 diabetes mellitus with diabetic neuropathic arthropathy: Secondary | ICD-10-CM

## 2018-06-12 DIAGNOSIS — E118 Type 2 diabetes mellitus with unspecified complications: Secondary | ICD-10-CM

## 2018-06-28 ENCOUNTER — Telehealth: Payer: Self-pay | Admitting: Endocrinology

## 2018-06-28 MED ORDER — INSULIN SYRINGES (DISPOSABLE) U-100 1 ML MISC: 2 refills | Status: DC

## 2018-06-28 NOTE — Telephone Encounter (Signed)
Please review and advise.

## 2018-06-28 NOTE — Telephone Encounter (Signed)
Patient called re: Patient's Medtronic 670G has been recalled (the retainer ring is broken off the top). [employer] advised patient to stop using the above insulin pump ([employer] is sending patient a new pump tomorrow) but told patient to go to a back up plan or Plan B, patient states he has no back up plan or Plan B. Please call patient at ph# [phone] to advise.

## 2018-06-29 ENCOUNTER — Telehealth: Payer: Self-pay | Admitting: Endocrinology

## 2018-06-29 NOTE — Telephone Encounter (Signed)
Per [place] states called the office earlier. Insulin pump is broken and the manufacturer advised him to stop using it. Never got a call back and is not sure what to do".

## 2018-06-29 NOTE — Telephone Encounter (Signed)
Dr. Kelton Pillar states she received an after hours call about this pt and this issue. The issue has been resolved by Dr. Kelton Pillar personally.

## 2018-06-29 NOTE — Telephone Encounter (Signed)
Received a call from the answering service on 06/29/2018 at 1830 with a pump failure note on the patient.   Pt contacted [employer] and will have one shipped but in the meantime he was advised to contact his endocrinologist  Pt still had the pump on    Recommendations given to the patient    Lantus 12 units ASAP (he has a pen and needles at home Discontinue the pump at 2 hrs from the lantus dose  Pt will use Novolog from the vial as the following  Insulin to CHO ratio of 1:11, he was advised to take it 5-10 minutes before a meal or immediately at the beginning of the meal Correction Factor (BG-120/50)     Pt expressed understating and was able to read back instructions     A prescription for syringes was sent to his pharmacy     Abby Nena Jordan, MD  [facility] Endocrinology  [facility] [address] [place], [place] [address] Phone: [phone] FAX: [phone]

## 2018-07-02 ENCOUNTER — Telehealth: Payer: Self-pay | Admitting: Endocrinology

## 2018-07-02 ENCOUNTER — Other Ambulatory Visit: Payer: Self-pay

## 2018-07-02 ENCOUNTER — Encounter: Payer: Self-pay | Admitting: Internal Medicine

## 2018-07-02 ENCOUNTER — Ambulatory Visit (INDEPENDENT_AMBULATORY_CARE_PROVIDER_SITE_OTHER): Payer: BC Managed Care – PPO | Admitting: Internal Medicine

## 2018-07-02 VITALS — BP 130/82 | HR 124 | Ht 72.0 in | Wt 360.2 lb

## 2018-07-02 DIAGNOSIS — E10610 Type 1 diabetes mellitus with diabetic neuropathic arthropathy: Secondary | ICD-10-CM

## 2018-07-02 DIAGNOSIS — IMO0002 Reserved for concepts with insufficient information to code with codable children: Secondary | ICD-10-CM

## 2018-07-02 DIAGNOSIS — E1065 Type 1 diabetes mellitus with hyperglycemia: Secondary | ICD-10-CM

## 2018-07-02 LAB — GLUCOSE, POCT (MANUAL RESULT ENTRY): POC Glucose: 129 mg/dl — AB (ref 70–99)

## 2018-07-02 NOTE — Patient Instructions (Signed)
PUMP SETTINGS & DOWNLOAD   BASAL SETTINGS: Time  Basal Rate (units/ hr)   0000-0000 0.475  Insulin to Carb ratio  1:10  Sensitivity Factor  40  Active insulin time  3 hours

## 2018-07-02 NOTE — Progress Notes (Signed)
Name: Michael Hutchinson  Age/ Sex: 43 y.o., male   MRN/ DOB: [id number], 10-12-1975     PCP: Mellody Dance, DO   Reason for Endocrinology Evaluation: Type 1 Diabetes Mellitus  Initial Endocrine Consultative Visit: 10/19/2016    PATIENT IDENTIFIER: Michael Hutchinson is a 43 y.o. male with a past medical history of T1DM and MS . The patient has followed with Endocrinology clinic since 10/19/2016 for consultative assistance with management of his diabetes.  DIABETIC HISTORY:  Michael Hutchinson was diagnosed with T1DM diagnosed in 38. Has been on  insulin pump since 2018.  His  hemoglobin A1c has ranged from 6.9% in 2019, peaking at 11.5% in 2017.   SUBJECTIVE:    Today (07/04/2018): Michael Hutchinson is here for a follow up on his diabetes management. He had a  Malfunctioning medtronic pump 6/18th, he was switched to an MDI regimen of lantus 12 units daily (based on basal rate of 0.4 u/hr) , continued I:C ratio of 1:11 and SF of 50 with a BG goal of 120.  Pt woke up the next morning with BG in 300's. He has been back on his new pump Friday 6/19th ~ 1830 . Pt noted persistent hyperglycemia, with initial insertion of the pump, he has changed the site twice since then. He has been giving himself [place] novolog correction to bring his glucose down.    ROS: As per HPI and as detailed below: Review of Systems  Constitutional: Negative for chills and fever.  Respiratory: Negative for cough and shortness of breath.   Cardiovascular: Negative for chest pain and palpitations.  Gastrointestinal: Negative for nausea and vomiting.       PUMP SETTINGS & DOWNLOAD: Report dates: 6/9-6/22/2020   BASAL SETTINGS (MANUAL MODE ONLY): Time  Basal Rate (units/ hr)   000 0.4     Total 24-hour basal: 9.0 units   BOLUS SETTINGS: Insulin:Carb Ratio:  11   Sensitivity (adjustable in manual mode only - calculated by algorithm in auto mode):  50   Target BG (adjustable in manual mode only - set at 120, correction  bolus to 150 then microboluses do remainder):  120   Active Insulin Time:  4      CURRENT PUMP STATISTICS: Auto mode =10 % Manual mode = 90% Sensor wear =20 % Average BG: 235 +/- 44 Average SG: 213 +/- 54 BG checks / Calibrations/day: 11.5 Average Daily Carbs (g): 429 Average Total Daily Insulin: 57 units Average Daily Bolus: 48 units (84%) Average Daily Basal: 9 units (16 %)     HISTORY:  Past Medical History:  Past Medical History:  Diagnosis Date  . Charcot's joint of foot 11/25/2013  . Complication of anesthesia    "I wake up angry" (12/31/2015)  . Diabetes mellitus type 1 ([place]) dx'd 1981  . Diabetic ketoacidosis ([place])   . Essential hypertension 05/03/2013  . GERD (gastroesophageal reflux disease)   . High cholesterol   . Hx MRSA infection    Inner thigh and under arm- healed areas  . Meniscus tear   . Shortness of breath    with exertion only   Past Surgical History:  Past Surgical History:  Procedure Laterality Date  . AMPUTATION Left 01/01/2016   Procedure: AMPUTATION BELOW KNEE;  Surgeon: [name], MD;  Location: [facility];  Service: Orthopedics;  Laterality: Left;  . APPLICATION OF WOUND VAC  04/26/2016  . FRACTURE SURGERY    . HERNIA REPAIR    . I&D EXTREMITY Left  04/26/2016   Procedure: IRRIGATION AND DEBRIDEMENT EXTREMITY/Left Leg/Possible Wound Vac;  Surgeon: [name], MD;  Location: [facility];  Service: Orthopedics;  Laterality: Left;  . INCISE AND DRAIN ABCESS Left 04/26/2016  . KNEE ARTHROSCOPY Left ~ 2010  . LAPAROSCOPIC CHOLECYSTECTOMY  2015  . METACARPOPHALANGEAL JOINT ARTHRODESIS Left 06/2012   Fracture left index finger intra-articular MCP joint/notes 06/30/2012  . OPEN REDUCTION INTERNAL FIXATION (ORIF) PROXIMAL PHALANX Left 06/30/2012   Procedure: OPEN REDUCTION INTERNAL FIXATION (ORIF) LEFT INDEX FINGER PROXIMAL PHALANX FRACTURE WITH LIGAMENT REPAIR AS NECESSARY;  Surgeon: [name], MD;  Location: [facility];  Service: Orthopedics;   Laterality: Left;  . UMBILICAL HERNIA REPAIR  2015   "w/gallbladder OR"    Social History:  reports that he has never smoked. He quit smokeless tobacco use about 7 years ago.  His smokeless tobacco use included snuff and chew. He reports current alcohol use of about 14.0 standard drinks of alcohol per week. He reports that he does not use drugs. Family History:  Family History  Problem Relation Age of Onset  . Other Mother   . Cancer Mother        Breast / Bone  . Heart attack Father   . Hypertension Father   . Hyperlipidemia Father   . Diabetes Unknown   . Alcohol abuse Sister   . Diabetes Maternal Grandfather   . Stroke Paternal Grandmother   . Alcohol abuse Paternal Grandfather      HOME MEDICATIONS: Allergies as of 07/02/2018      Reactions   Influenza Virus Vaccine Split Nausea And Vomiting   Hydrochlorothiazide Other (See Comments)   Dizziness   Lisinopril Cough   Losartan Other (See Comments)   Dizziness      Medication List       Accurate as of July 02, 2018 11:59 PM. If you have any questions, ask your nurse or doctor.        aspirin 81 MG chewable tablet Chew 1 tablet (81 mg total) by mouth daily.   Bayer Microlet Lancets lancets Use as instructed to check 3 times daily.   etodolac 400 MG tablet Commonly known as: LODINE etodolac 400 mg tablet   ezetimibe 10 MG tablet Commonly known as: Zetia Take 1 tablet (10 mg total) by mouth at bedtime.   Glatiramer Acetate 40 MG/ML Sosy Commonly known as: Copaxone Inject 40 mg into the skin every Monday, Wednesday, and Friday.   Glatiramer Acetate 40 MG/ML Sosy Inject 40 mg into the skin as directed. Monday, Wednesday and Friday   glucagon 1 MG injection Commonly known as: Glucagon Emergency Inject 1 mg into the muscle once as needed. What changed:   when to take this  reasons to take this   glucose blood test strip Commonly known as: Bayer Contour Next Test 1 each by Other route 4 (four) times  daily. And lancets 4/day   hydrochlorothiazide 25 MG tablet Commonly known as: HYDRODIURIL Take 1 tablet (25 mg total) by mouth daily.   HYDROcodone-acetaminophen 5-325 MG tablet Commonly known as: NORCO/VICODIN hydrocodone 5 mg-acetaminophen 325 mg tablet   ibuprofen 800 MG tablet Commonly known as: ADVIL Take 1 tablet (800 mg total) by mouth 3 (three) times daily.   insulin lispro 100 UNIT/ML injection Commonly known as: HumaLOG USE 70 UNITS A DAY VIA INSULIN PUMP.   Insulin Syringes (Disposable) U-100 1 ML Misc 3x daily   losartan 50 MG tablet Commonly known as: COZAAR 1 po qd   montelukast 10  MG tablet Commonly known as: SINGULAIR Take 1 tablet (10 mg total) by mouth at bedtime.   omeprazole 20 MG capsule Commonly known as: PRILOSEC Take 20 mg twice daily for 4 weeks then once daily   ONE TOUCH ULTRA SYSTEM KIT w/Device Kit 1 kit by Does not apply route once. Check blood sugar 3 times daily. Diagnosis Diabetes ICD-10 E11.8   rosuvastatin 10 MG tablet Commonly known as: CRESTOR rosuvastatin 10 mg tablet   Vitamin D (Ergocalciferol) 1.25 MG (50000 UT) Caps capsule Commonly known as: DRISDOL 1 tab every Wednesday and Sunday        OBJECTIVE:   Vital Signs: BP 130/82 (BP Location: Left Arm, Patient Position: Sitting, Cuff Size: Large)   Pulse (!) 124   Ht 6' (1.829 m)   Wt (!) 360 lb 3.2 oz (163.4 kg)   SpO2 94%   BMI 48.85 kg/m   Wt Readings from Last 3 Encounters:  07/02/18 (!) 360 lb 3.2 oz (163.4 kg)  06/05/18 (!) 330 lb (149.7 kg)  02/23/18 (!) 331 lb 12.8 oz (150.5 kg)     Exam: General: Pt appears well and is in NAD  Neck: General: Supple without adenopathy. Thyroid: Thyroid size normal.  No goiter or nodules appreciated. No thyroid bruit.  Lungs: Clear with good BS bilat with no rales, rhonchi, or wheezes  Heart: RRR   Abdomen: Normoactive bowel sounds, soft, nontender, pump insertion site is clean  Extremities:  Right 1+ pretibial edema.  Left BKA  Skin: Normal texture and temperature to palpation. No rash noted. No Acanthosis nigricans/skin tags. No lipohypertrophy.  Neuro: MS is good with appropriate affect, pt is alert and Ox3      DATA REVIEWED:  Lab Results  Component Value Date   HGBA1C 7.2 (H) 07/02/2018   HGBA1C 7.3 (A) 02/23/2018   HGBA1C 7.0 (A) 11/06/2017   Results for Michael Hutchinson, Hutchinson (MRN [id number]) as of 07/04/2018 10:19  Ref. Range 07/02/2018 16:16  Sodium Latest Ref Range: 135 - 145 mEq/L 134 (L)  Potassium Latest Ref Range: 3.5 - 5.1 mEq/L 4.5  Chloride Latest Ref Range: 96 - 112 mEq/L 99  CO2 Latest Ref Range: 19 - 32 mEq/L 30  Glucose Latest Ref Range: 70 - 99 mg/dL 111 (H)  BUN Latest Ref Range: 6 - 23 mg/dL 20  Creatinine Latest Ref Range: 0.40 - 1.50 mg/dL 1.10  Calcium Latest Ref Range: 8.4 - 10.5 mg/dL 9.3  GFR Latest Ref Range: >60.00 mL/min 73.74   ASSESSMENT / PLAN / RECOMMENDATIONS:   1) Type 1 Diabetes Mellitus, Sub-Optimally controlled, With hx of BKA  - Most recent A1c of 7.2 %. Goal A1c < 7.0 %.    Plan:  - New pump started 3 days ago, with persistent hyperglycemia, has been fasting all day, BG at 1400 187 mg/dL. In-office BG 129 mg/dL. Pt does not recall if he gave himself [place] novolog. I am not sure if the pump is working at this time without knowing Michael Hutchinson novolog intake.  - Pt advised to check BG again in an hour, if BG > 261m/dL , to give bolus per pump, avoid eating but continue to hydrate, recheck in 1 hr , if BG remains uncorrected he will have to change the site again.   - In the meantime, I will adjust his manual settings in case the pump actually works.  - BMP shows no evidence of DKA   MEDICATIONS: PUMP SETTINGS: Time  Basal Rate (units/ hr)   0000-0000  0.475  Insulin to Carb ratio  1:10  Sensitivity Factor  40  Active insulin time  3 hours       EDUCATION / INSTRUCTIONS:  BG monitoring instructions: Patient is instructed to check his blood sugars 4 times a day,  before meals and bedtime.  Call [facility] if: BG persistently < 70 or > 300. . I reviewed the Rule of 15 for the treatment of hypoglycemia in detail with the patient. Literature supplied.      Signed electronically by: Mack Guise, MD  [facility] Endocrinology  [facility] [address], [place], [place] [address] Phone: [phone] FAX: [phone]   CC: Mellody Dance, [place] [address] Phone: [phone]  Fax: [phone]  Return to Endocrinology clinic as below: Future Appointments  Date Time Provider [place]  08/20/2018  8:50 AM Pieter Partridge, DO [facility] None  08/27/2018  8:45 AM Renato Shin, MD [facility] None

## 2018-07-02 NOTE — Telephone Encounter (Signed)
Patient would like a call back to [phone] to discuss what he feels was a breakdown in communication regarding his pump, getting assistance for his broken pump and a Plan B from last week.  See previous notes for time line.

## 2018-07-02 NOTE — Telephone Encounter (Signed)
Returned pt call. Scheduled for an office visit today (07/02/18) with Dr. Kelton Pillar.

## 2018-07-03 LAB — BASIC METABOLIC PANEL
BUN: 20 mg/dL (ref 6–23)
CO2: 30 mEq/L (ref 19–32)
Calcium: 9.3 mg/dL (ref 8.4–10.5)
Chloride: 99 mEq/L (ref 96–112)
Creatinine, Ser: 1.1 mg/dL (ref 0.40–1.50)
GFR: 73.74 mL/min (ref >60.00)
Glucose, Bld: 111 mg/dL — ABNORMAL HIGH (ref 70–99)
Potassium: 4.5 mEq/L (ref 3.5–5.1)
Sodium: 134 mEq/L — ABNORMAL LOW (ref 135–145)

## 2018-07-03 LAB — HEMOGLOBIN A1C: Hgb A1c MFr Bld: 7.2 % — ABNORMAL HIGH (ref 4.6–6.5)

## 2018-07-09 DIAGNOSIS — Z20828 Contact with and (suspected) exposure to other viral communicable diseases: Secondary | ICD-10-CM | POA: Diagnosis not present

## 2018-07-23 DIAGNOSIS — E109 Type 1 diabetes mellitus without complications: Secondary | ICD-10-CM | POA: Diagnosis not present

## 2018-07-23 DIAGNOSIS — Z794 Long term (current) use of insulin: Secondary | ICD-10-CM | POA: Diagnosis not present

## 2018-07-27 DIAGNOSIS — M25511 Pain in right shoulder: Secondary | ICD-10-CM | POA: Diagnosis not present

## 2018-07-27 DIAGNOSIS — M7501 Adhesive capsulitis of right shoulder: Secondary | ICD-10-CM | POA: Insufficient documentation

## 2018-07-27 DIAGNOSIS — M25521 Pain in right elbow: Secondary | ICD-10-CM | POA: Diagnosis not present

## 2018-07-27 DIAGNOSIS — M7711 Lateral epicondylitis, right elbow: Secondary | ICD-10-CM | POA: Diagnosis not present

## 2018-08-08 DIAGNOSIS — M7501 Adhesive capsulitis of right shoulder: Secondary | ICD-10-CM | POA: Diagnosis not present

## 2018-08-09 DIAGNOSIS — M25511 Pain in right shoulder: Secondary | ICD-10-CM | POA: Diagnosis not present

## 2018-08-19 NOTE — Progress Notes (Addendum)
Virtual Visit via Video Note The purpose of this virtual visit is to provide medical care while limiting exposure to the novel coronavirus.    Consent was obtained for video visit:  Yes Answered questions that patient had about telehealth interaction:  Yes I discussed the limitations, risks, security and privacy concerns of performing an evaluation and management service by telemedicine. I also discussed with the patient that there may be a patient responsible charge related to this service. The patient expressed understanding and agreed to proceed.  Pt location: Home Physician Location: Home Name of referring provider:  Mellody Dance, DO I connected with Michael Hutchinson at patients initiation/request on 08/20/2018 at  8:50 AM EDT by video enabled telemedicine application and verified that I am speaking with the correct person using two identifiers. Pt MRN:  [id number] Pt DOB:  [birth year] Video Participants:  Michael Hutchinson   History of Present Illness:  Michael Hutchinson is a 43 year old right-handed Caucasian man with type 1 diabetes with history of DKA, hypertension, Charcot's joint of foot, left below the knee amputation, hyperlipidemia and GERD who follows up for multiple sclerosis.    UPDATE: Current disease modifying therapy: Copaxone Other current medications: D3 50,000 IU twice weekly (have missed some doses)  MRI of brain with and without contrast on 02/25/18 was personally reviewed and demonstrated no change since prior study from March 2019.  Vision:  Vision has cleared.   Motor:  Sometimes notes tremors in either hand, primarily the right.   Sensory:  No numbness. Pain:  Pain in legs and low back, right shoulder pain, increased joint stiffness. Gait:  No change Bowel/Bladder:  No issues. Fatigue:  yes.  He easily sleeps when he sits down in his recliner.  He gets about 4 1/2 to 6 hours of sleep a night.  Has not been checked for sleep apnea.  His wife has told him he  does not seem to snore or gasp for air in his sleep.   Cognition:  no Mood:  No depression  07/02/18 LABS:  Na 134, K 4.5, Cl 99, CO2 30, glucose 111, BUN 20, Cr 1.10; Hgb A1c 7.2.  HISTORY:  In October 2016, he developed a right 6th cranial nerve palsy which was thought to be due to his uncontrolled type 1 diabetes.  In March 2017, he developed blurred vision in the left eye.  He saw ophthalmologist Dr. Julian Reil who diagnosed optic neuritis.  MRI of the brain and orbits with and without contrast from 05/11/2015 revealed "2 T2 hyperintense lesions within the left hemisphere adjacent to the posterior horn of the left lateral ventricle ", a nonspecific finding and without abnormal contrast enhancement.  In March 2019, he developed disconjugate gaze and droopy left eyelid.  He saw the eye doctor who diagnosed a left 3rd nerve palsy.  He was evaluated in the ED at [facility] on 03/28/2017.  CTA of the head was negative for aneurysm.  MRI of the brain with and without contrast demonstrated multiple T2/flair hyperintense foci involving the periventricular, deep and subcortical white matter, progressed compared to prior MRI from 2017.  In-house neurology believed his findings were more likely due to a diabetic 3rd nerve palsy rather than MS.  He was advised to start aspirin 81 mg daily and follow-up with outpatient neurology.  He subsequently underwent a work-up for multiple sclerosis.  MRI of the cervical spine with and without contrast from 05/04/2017 revealed no cord lesions.  He underwent lumbar  puncture on 05/19/2017 for CSF analysis which revealed elevated IgG index of 0.79 and 4 oligoclonal bands not present in the corresponding serum.  Cell count was 1, elevated protein 157, elevated glucose 100, negative myelin basic protein, ACE 4, Gram stain and culture negative.  Past disease modifying therapy: Tecfidera (GI upset)  Past Medical History: Past Medical History:  Diagnosis Date  .  Charcot's joint of foot 11/25/2013  . Complication of anesthesia    "I wake up angry" (12/31/2015)  . Diabetes mellitus type 1 ([place]) dx'd 1981  . Diabetic ketoacidosis ([place])   . Essential hypertension 05/03/2013  . GERD (gastroesophageal reflux disease)   . High cholesterol   . Hx MRSA infection    Inner thigh and under arm- healed areas  . Meniscus tear   . Shortness of breath    with exertion only    Medications: Outpatient Encounter Medications as of 08/20/2018  Medication Sig  . aspirin 81 MG chewable tablet Chew 1 tablet (81 mg total) by mouth daily.  Marland Kitchen BAYER MICROLET LANCETS lancets Use as instructed to check 3 times daily.  . Blood Glucose Monitoring Suppl (ONE TOUCH ULTRA SYSTEM KIT) w/Device KIT 1 kit by Does not apply route once. Check blood sugar 3 times daily. Diagnosis Diabetes ICD-10 E11.8  . etodolac (LODINE) 400 MG tablet etodolac 400 mg tablet  . ezetimibe (ZETIA) 10 MG tablet Take 1 tablet (10 mg total) by mouth at bedtime.  . Glatiramer Acetate (COPAXONE) 40 MG/ML SOSY Inject 40 mg into the skin every Monday, Wednesday, and Friday.  . Glatiramer Acetate 40 MG/ML SOSY Inject 40 mg into the skin as directed. Monday, Wednesday and Friday  . glucagon (GLUCAGON EMERGENCY) 1 MG injection Inject 1 mg into the muscle once as needed. (Patient taking differently: Inject 1 mg into the muscle daily as needed (hypoglycemia). )  . glucose blood (BAYER CONTOUR NEXT TEST) test strip 1 each by Other route 4 (four) times daily. And lancets 4/day  . hydrochlorothiazide (HYDRODIURIL) 25 MG tablet Take 1 tablet (25 mg total) by mouth daily.  Marland Kitchen HYDROcodone-acetaminophen (NORCO/VICODIN) 5-325 MG tablet hydrocodone 5 mg-acetaminophen 325 mg tablet  . ibuprofen (ADVIL,MOTRIN) 800 MG tablet Take 1 tablet (800 mg total) by mouth 3 (three) times daily.  . insulin lispro (HUMALOG) 100 UNIT/ML injection USE 70 UNITS A DAY VIA INSULIN PUMP.  . Insulin Syringes, Disposable, U-100 1 ML MISC 3x daily   . losartan (COZAAR) 50 MG tablet 1 po qd  . montelukast (SINGULAIR) 10 MG tablet Take 1 tablet (10 mg total) by mouth at bedtime.  Marland Kitchen omeprazole (PRILOSEC) 20 MG capsule Take 20 mg twice daily for 4 weeks then once daily  . rosuvastatin (CRESTOR) 10 MG tablet rosuvastatin 10 mg tablet  . Vitamin D, Ergocalciferol, (DRISDOL) 50000 units CAPS capsule 1 tab every Wednesday and Sunday   No facility-administered encounter medications on file as of 08/20/2018.     Allergies: Allergies  Allergen Reactions  . Influenza Virus Vaccine Split Nausea And Vomiting  . Hydrochlorothiazide Other (See Comments)    Dizziness  . Lisinopril Cough  . Losartan Other (See Comments)    Dizziness    Family History: Family History  Problem Relation Age of Onset  . Other Mother   . Cancer Mother        Breast / Bone  . Heart attack Father   . Hypertension Father   . Hyperlipidemia Father   . Diabetes Unknown   . Alcohol abuse Sister   .  Diabetes Maternal Grandfather   . Stroke Paternal Grandmother   . Alcohol abuse Paternal Grandfather     Social History: Social History   Socioeconomic History  . Marital status: Married    Spouse name: Estill Bamberg  . Number of children: 2  . Years of education: Not on file  . Highest education level: High school graduate  Occupational History  . Occupation: Dealer  Social Needs  . Financial resource strain: Not on file  . Food insecurity    Worry: Not on file    Inability: Not on file  . Transportation needs    Medical: Not on file    Non-medical: Not on file  Tobacco Use  . Smoking status: Never Smoker  . Smokeless tobacco: Former Systems developer    Types: Snuff, Chew  Substance and Sexual Activity  . Alcohol use: Yes    Alcohol/week: 14.0 standard drinks    Types: 14 Cans of beer per week  . Drug use: No  . Sexual activity: Yes  Lifestyle  . Physical activity    Days per week: Not on file    Minutes per session: Not on file  . Stress: Not on file   Relationships  . Social Herbalist on phone: Not on file    Gets together: Not on file    Attends religious service: Not on file    Active member of club or organization: Not on file    Attends meetings of clubs or organizations: Not on file    Relationship status: Not on file  . Intimate partner violence    Fear of current or ex partner: Not on file    Emotionally abused: Not on file    Physically abused: Not on file    Forced sexual activity: Not on file  Other Topics Concern  . Not on file  Social History Narrative   Pt is a right handed married man, lives with his wife and 2 children ages 79 and 79, in a single story house. Pt drinks 6-8, 16oz diet sodas a day.     Observations/Objective:   Height 6' (1.829 m), weight (!) 345 lb (156.5 kg). No acute distress.  Alert and oriented.  Speech fluent and not dysarthric.  Language intact.  Eyes orthophoric on primary gaze.  Face symmetric.  Assessment and Plan:   Multiple sclerosis.  While ischemic cranial nerve palsies are seen in uncontrolled diabetes, given the 3 distinct episodes of various cranial nerve palsies, the appearance of white matter lesions on brain MRI, and CSF analysis demonstrated for oligoclonal bands not present in serum, I suspect MS.  He does have significantly elevated CSF protein but does not exhibit any sign of infection.  Diabetes may be contributor to this.  1.  Copaxone 2.  Advised to take D3 50,000 IU twice weekly as prescribed and repeat vitamin D level in 6 months (about a week prior to follow up) 3.  To help with body stiffness, baclofen 55m three times daily as needed (cautioned regarding drowsiness) 4.  Discussed possibility of getting a sleep study to assess for OSA.  He defers at this time. 5.  Follow up in 6 months.  Follow Up Instructions:    -I discussed the assessment and treatment plan with the patient. The patient was provided an opportunity to ask questions and all were answered.  The patient agreed with the plan and demonstrated an understanding of the instructions.   The patient was advised to call back  or seek an in-person evaluation if the symptoms worsen or if the condition fails to improve as anticipated.    Total Time spent in visit with the patient was:  15 minutes  Dudley Major, DO

## 2018-08-20 ENCOUNTER — Telehealth (INDEPENDENT_AMBULATORY_CARE_PROVIDER_SITE_OTHER): Payer: BC Managed Care – PPO | Admitting: Neurology

## 2018-08-20 ENCOUNTER — Encounter: Payer: Self-pay | Admitting: Neurology

## 2018-08-20 ENCOUNTER — Other Ambulatory Visit: Payer: Self-pay

## 2018-08-20 VITALS — Ht 72.0 in | Wt 345.0 lb

## 2018-08-20 DIAGNOSIS — E559 Vitamin D deficiency, unspecified: Secondary | ICD-10-CM

## 2018-08-20 DIAGNOSIS — G35 Multiple sclerosis: Secondary | ICD-10-CM | POA: Diagnosis not present

## 2018-08-20 MED ORDER — BACLOFEN 10 MG PO TABS: 10.0000 mg | ORAL_TABLET | Freq: Three times a day (TID) | ORAL | 3 refills | Status: DC | PRN

## 2018-08-20 NOTE — Addendum Note (Signed)
Addended by: Clois Comber on: 08/20/2018 09:00 AM   Modules accepted: Orders

## 2018-08-24 DIAGNOSIS — M25511 Pain in right shoulder: Secondary | ICD-10-CM | POA: Diagnosis not present

## 2018-08-27 ENCOUNTER — Ambulatory Visit: Payer: BLUE CROSS/BLUE SHIELD | Admitting: Endocrinology

## 2018-08-30 DIAGNOSIS — M25511 Pain in right shoulder: Secondary | ICD-10-CM | POA: Diagnosis not present

## 2018-08-31 ENCOUNTER — Telehealth: Payer: Self-pay

## 2018-08-31 DIAGNOSIS — Z794 Long term (current) use of insulin: Secondary | ICD-10-CM | POA: Diagnosis not present

## 2018-08-31 DIAGNOSIS — E109 Type 1 diabetes mellitus without complications: Secondary | ICD-10-CM | POA: Diagnosis not present

## 2018-08-31 NOTE — Telephone Encounter (Signed)
Completed.

## 2018-08-31 NOTE — Telephone Encounter (Signed)
Received notification from [place] [facility] to complete DWO (Detailed Written Orders). Per Dr. Loanne Drilling, unable to complete without a current appt. Routing this message to the front desk for scheduling purposes.

## 2018-09-03 ENCOUNTER — Ambulatory Visit (INDEPENDENT_AMBULATORY_CARE_PROVIDER_SITE_OTHER): Payer: BC Managed Care – PPO | Admitting: Endocrinology

## 2018-09-03 ENCOUNTER — Encounter: Payer: Self-pay | Admitting: Endocrinology

## 2018-09-03 ENCOUNTER — Other Ambulatory Visit: Payer: Self-pay

## 2018-09-03 VITALS — Ht 72.0 in

## 2018-09-03 DIAGNOSIS — E1065 Type 1 diabetes mellitus with hyperglycemia: Secondary | ICD-10-CM | POA: Diagnosis not present

## 2018-09-03 DIAGNOSIS — E10610 Type 1 diabetes mellitus with diabetic neuropathic arthropathy: Secondary | ICD-10-CM

## 2018-09-03 DIAGNOSIS — IMO0002 Reserved for concepts with insufficient information to code with codable children: Secondary | ICD-10-CM

## 2018-09-03 NOTE — Progress Notes (Signed)
Subjective:    Patient ID: Michael Hutchinson, male    DOB: 01-07-1976, 43 y.o.   MRN: [id number]  HPI telehealth visit today via phone x 13 minutes.   Alternatives to telehealth are presented to this patient, and the patient agrees to the telehealth visit.  Pt is advised of the cost of the visit, and agrees to this, also.   Patient is at home, and I am at the office.   Persons attending the telehealth visit: the patient and I Pt returns for f/u of diabetes mellitus: DM type: 1 Dx'ed: 9166 Complications: left BKA, polyneuropathy, renal insuff, PAD, and right charcot foot.  Therapy: insulin since dx, and pump rx ([id number] 741), since 2018.  He has a medtronic continuous glucose monitor.   DKA: once, in 2009 Severe hypoglycemia: once, in 2008.   Pancreatitis: never Pancreatic imaging: never Other:  Interval History: He takes these settings:  Please take these pump settings:  basal rate of 0.4 units/hr, 24 HRS per day (when not in auto mode).   mealtime bolus of 1 unit/11 grams carbohydrate.   correction bolus (which some people call "sensitivity," or "insulin sensitivity ratio," or just "isr") of 1 unit for each 50 by which your glucose exceeds 120.  He says cbg varies from 59-290.  He seldom has hypoglycemia, and these episodes are mild.  This happens in the middle of the night.  He says cbg is still highest after meals.  He says most cbg's are in the low to mid-100's.  He says TDD is approx 43 units.  He says continuous glucose monitor does not agree with cbg's.   Past Medical History:  Diagnosis Date  . Charcot's joint of foot 11/25/2013  . Complication of anesthesia    "I wake up angry" (12/31/2015)  . Diabetes mellitus type 1 ([place]) dx'd 1981  . Diabetic ketoacidosis ([place])   . Essential hypertension 05/03/2013  . GERD (gastroesophageal reflux disease)   . High cholesterol   . Hx MRSA infection    Inner thigh and under arm- healed areas  . Meniscus tear   . Shortness of breath     with exertion only    Past Surgical History:  Procedure Laterality Date  . AMPUTATION Left 01/01/2016   Procedure: AMPUTATION BELOW KNEE;  Surgeon: [name], MD;  Location: [facility];  Service: Orthopedics;  Laterality: Left;  . APPLICATION OF WOUND VAC  04/26/2016  . FRACTURE SURGERY    . HERNIA REPAIR    . I&D EXTREMITY Left 04/26/2016   Procedure: IRRIGATION AND DEBRIDEMENT EXTREMITY/Left Leg/Possible Wound Vac;  Surgeon: [name], MD;  Location: [facility];  Service: Orthopedics;  Laterality: Left;  . INCISE AND DRAIN ABCESS Left 04/26/2016  . KNEE ARTHROSCOPY Left ~ 2010  . LAPAROSCOPIC CHOLECYSTECTOMY  2015  . METACARPOPHALANGEAL JOINT ARTHRODESIS Left 06/2012   Fracture left index finger intra-articular MCP joint/notes 06/30/2012  . OPEN REDUCTION INTERNAL FIXATION (ORIF) PROXIMAL PHALANX Left 06/30/2012   Procedure: OPEN REDUCTION INTERNAL FIXATION (ORIF) LEFT INDEX FINGER PROXIMAL PHALANX FRACTURE WITH LIGAMENT REPAIR AS NECESSARY;  Surgeon: [name], MD;  Location: [facility];  Service: Orthopedics;  Laterality: Left;  . UMBILICAL HERNIA REPAIR  2015   "w/gallbladder OR"    Social History   Socioeconomic History  . Marital status: Married    Spouse name: Estill Bamberg  . Number of children: 2  . Years of education: Not on file  . Highest education level: High school graduate  Occupational History  .  Occupation: Dealer  Social Needs  . Financial resource strain: Not on file  . Food insecurity    Worry: Not on file    Inability: Not on file  . Transportation needs    Medical: Not on file    Non-medical: Not on file  Tobacco Use  . Smoking status: Never Smoker  . Smokeless tobacco: Former Systems developer    Types: Snuff, Chew  Substance and Sexual Activity  . Alcohol use: Yes    Alcohol/week: 14.0 standard drinks    Types: 14 Cans of beer per week  . Drug use: No  . Sexual activity: Yes  Lifestyle  . Physical activity    Days per week: Not on file    Minutes per session:  Not on file  . Stress: Not on file  Relationships  . Social Herbalist on phone: Not on file    Gets together: Not on file    Attends religious service: Not on file    Active member of club or organization: Not on file    Attends meetings of clubs or organizations: Not on file    Relationship status: Not on file  . Intimate partner violence    Fear of current or ex partner: Not on file    Emotionally abused: Not on file    Physically abused: Not on file    Forced sexual activity: Not on file  Other Topics Concern  . Not on file  Social History Narrative   Pt is a right handed married man, lives with his wife and 2 children ages 7 and 29, in a single story house. Pt drinks 6-8, 16oz diet sodas a day.     Current Outpatient Medications on File Prior to Visit  Medication Sig Dispense Refill  . aspirin 81 MG chewable tablet Chew 1 tablet (81 mg total) by mouth daily. 30 tablet 0  . baclofen (LIORESAL) 10 MG tablet Take 1 tablet (10 mg total) by mouth 3 (three) times daily as needed for muscle spasms. 90 each 3  . BAYER MICROLET LANCETS lancets Use as instructed to check 3 times daily. 100 each 12  . Blood Glucose Monitoring Suppl (ONE TOUCH ULTRA SYSTEM KIT) w/Device KIT 1 kit by Does not apply route once. Check blood sugar 3 times daily. Diagnosis Diabetes ICD-10 E11.8    . etodolac (LODINE) 400 MG tablet etodolac 400 mg tablet    . Glatiramer Acetate 40 MG/ML SOSY Inject 40 mg into the skin as directed. Monday, Wednesday and Friday 12 Syringe 11  . glucagon (GLUCAGON EMERGENCY) 1 MG injection Inject 1 mg into the muscle once as needed. (Patient taking differently: Inject 1 mg into the muscle daily as needed (hypoglycemia). ) 1 each 11  . glucose blood (BAYER CONTOUR NEXT TEST) test strip 1 each by Other route 4 (four) times daily. And lancets 4/day 400 each 3  . HYDROcodone-acetaminophen (NORCO/VICODIN) 5-325 MG tablet hydrocodone 5 mg-acetaminophen 325 mg tablet    .  ibuprofen (ADVIL,MOTRIN) 800 MG tablet Take 1 tablet (800 mg total) by mouth 3 (three) times daily. 21 tablet 0  . insulin lispro (HUMALOG) 100 UNIT/ML injection USE 70 UNITS A DAY VIA INSULIN PUMP. 70 mL 3  . Insulin Syringes, Disposable, U-100 1 ML MISC 3x daily 50 each 2  . losartan (COZAAR) 50 MG tablet 1 po qd 30 tablet 0  . omeprazole (PRILOSEC) 20 MG capsule Take 20 mg twice daily for 4 weeks then once daily 60  capsule 0  . Vitamin D, Ergocalciferol, (DRISDOL) 50000 units CAPS capsule 1 tab every Wednesday and Sunday 24 capsule 10   No current facility-administered medications on file prior to visit.     Allergies  Allergen Reactions  . Influenza Virus Vaccine Split Nausea And Vomiting  . Hydrochlorothiazide Other (See Comments)    Dizziness  . Lisinopril Cough  . Losartan Other (See Comments)    Dizziness    Family History  Problem Relation Age of Onset  . Other Mother   . Cancer Mother        Breast / Bone  . Heart attack Father   . Hypertension Father   . Hyperlipidemia Father   . Diabetes Other   . Alcohol abuse Sister   . Diabetes Maternal Grandfather   . Stroke Paternal Grandmother   . Alcohol abuse Paternal Grandfather     Ht 6' (1.829 m)   BMI 46.79 kg/m    Review of Systems Denies LOC    Objective:   Physical Exam      Assessment & Plan:  Type 1 DM, with PAD: uncertain glycemic control.  discordance between continuous glucose monitor and cbg.  please Vaughan Basta for this.   Patient Instructions  Please take these pump settings:  basal rate of 0.4 units/hr, 24 HRS per day (when not in auto mode).   mealtime bolus of 1 unit/11 grams carbohydrate.  correction bolus (which some people call "sensitivity," or "insulin sensitivity ratio," or just "isr") of 1 unit for each 50 by which your glucose exceeds 120.  Please come back for a follow-up appointment in 1 month.  Please see Vaughan Basta the same day, to address the difference between continuous glucose monitor  and cbg's. For now, please increase the lower limit of the blood sugar range on your continuous glucose monitor, to avoid it going low in the middle os the night.   check your blood sugar 4 times a day: before the 3 meals, and at bedtime.  also check if you have symptoms of your blood sugar being too high or too low.  please keep a record of the readings and bring it to your next appointment here (or you can bring the meter itself).  You can write it on any piece of paper.  please call us sooner if your blood sugar goes below 70, or if you have a lot of readings over 200.

## 2018-09-03 NOTE — Patient Instructions (Addendum)
Please take these pump settings:  basal rate of 0.4 units/hr, 24 HRS per day (when not in auto mode).   mealtime bolus of 1 unit/11 grams carbohydrate.  correction bolus (which some people call "sensitivity," or "insulin sensitivity ratio," or just "isr") of 1 unit for each 50 by which your glucose exceeds 120.  Please come back for a follow-up appointment in 1 month.  Please see Vaughan Basta the same day, to address the difference between continuous glucose monitor and cbg's. For now, please increase the lower limit of the blood sugar range on your continuous glucose monitor, to avoid it going low in the middle os the night.   check your blood sugar 4 times a day: before the 3 meals, and at bedtime.  also check if you have symptoms of your blood sugar being too high or too low.  please keep a record of the readings and bring it to your next appointment here (or you can bring the meter itself).  You can write it on any piece of paper.  please call us sooner if your blood sugar goes below 70, or if you have a lot of readings over 200.

## 2018-09-05 DIAGNOSIS — Z794 Long term (current) use of insulin: Secondary | ICD-10-CM | POA: Diagnosis not present

## 2018-09-05 DIAGNOSIS — E109 Type 1 diabetes mellitus without complications: Secondary | ICD-10-CM | POA: Diagnosis not present

## 2018-09-26 ENCOUNTER — Ambulatory Visit: Payer: BC Managed Care – PPO | Admitting: Nutrition

## 2018-10-09 ENCOUNTER — Telehealth: Payer: Self-pay | Admitting: *Deleted

## 2018-10-09 ENCOUNTER — Telehealth: Payer: Self-pay | Admitting: Endocrinology

## 2018-10-09 DIAGNOSIS — R9082 White matter disease, unspecified: Secondary | ICD-10-CM

## 2018-10-09 DIAGNOSIS — Z79899 Other long term (current) drug therapy: Secondary | ICD-10-CM

## 2018-10-09 DIAGNOSIS — G35 Multiple sclerosis: Secondary | ICD-10-CM

## 2018-10-09 MED ORDER — GLATIRAMER ACETATE 40 MG/ML ~~LOC~~ SOSY: 40.0000 mg | PREFILLED_SYRINGE | SUBCUTANEOUS | 11 refills | Status: DC

## 2018-10-09 NOTE — Telephone Encounter (Signed)
Is pt willing to have a phone call visit? I don't know what DPR means

## 2018-10-09 NOTE — Telephone Encounter (Signed)
Patient is unavailable to come in sooner.  Please Advise, Thanks

## 2018-10-09 NOTE — Telephone Encounter (Signed)
Please advise 

## 2018-10-09 NOTE — Telephone Encounter (Signed)
Patient and wife is having blood sugar swings from the 30's into the 500's since being on the pump and taking Novalog.  Patients and wife are requesting a call back to discuss different pump and/or going back to insulin injections instead. Patient is completing and returning a DPR today 10/09/2018  Call back number is [phone]

## 2018-10-09 NOTE — Telephone Encounter (Signed)
OK, please schedule a phone call visit

## 2018-10-09 NOTE — Telephone Encounter (Signed)
Please move up next ov to next avail

## 2018-10-09 NOTE — Telephone Encounter (Signed)
Please refer to Dr. Ellison's request 

## 2018-10-09 NOTE — Telephone Encounter (Signed)
Received fax from [facility]) for script for glatiramer. In chart noted MD sent with 11 refills to [facility] on 09/26/17. Patient stated they dont carry this medication and the mail order pharmacy is who sends it to him. Called pharmacy where order was sent and they verified that they dont carry that med.  Resent order to [facility] [phone]  Phone) via [place]  - patient aware.

## 2018-10-09 NOTE — Telephone Encounter (Signed)
Please advise if you would like pt to come in for an appt to further discuss

## 2018-10-09 NOTE — Telephone Encounter (Signed)
Please call pt to schedule appt

## 2018-10-09 NOTE — Telephone Encounter (Signed)
Opened in error

## 2018-10-09 NOTE — Telephone Encounter (Signed)
DPR = Designated Party Release (HIPAA contact)  Please contact pt to determine if this is a possibility

## 2018-10-10 ENCOUNTER — Other Ambulatory Visit: Payer: Self-pay | Admitting: Family Medicine

## 2018-10-10 ENCOUNTER — Encounter: Payer: Self-pay | Admitting: Endocrinology

## 2018-10-10 DIAGNOSIS — E559 Vitamin D deficiency, unspecified: Secondary | ICD-10-CM

## 2018-10-10 NOTE — Telephone Encounter (Signed)
Please advise if you wish to make any changes before this appt?

## 2018-10-10 NOTE — Telephone Encounter (Signed)
Patient is scheduled for a MD to Patient appointment 10/11/18 at 9:00 a.m.

## 2018-10-10 NOTE — Telephone Encounter (Signed)
No change for now. 

## 2018-10-11 ENCOUNTER — Encounter: Payer: Self-pay | Admitting: *Deleted

## 2018-10-11 ENCOUNTER — Other Ambulatory Visit: Payer: Self-pay

## 2018-10-11 ENCOUNTER — Ambulatory Visit (INDEPENDENT_AMBULATORY_CARE_PROVIDER_SITE_OTHER): Payer: BC Managed Care – PPO | Admitting: Endocrinology

## 2018-10-11 DIAGNOSIS — E1065 Type 1 diabetes mellitus with hyperglycemia: Secondary | ICD-10-CM

## 2018-10-11 DIAGNOSIS — E1051 Type 1 diabetes mellitus with diabetic peripheral angiopathy without gangrene: Secondary | ICD-10-CM

## 2018-10-11 NOTE — Patient Instructions (Addendum)
Please take these pump settings:  basal rate of 0.4 units/hr, 24 HRS per day (when not in auto mode).   mealtime bolus of 1 unit/11 grams carbohydrate.  correction bolus (which some people call "sensitivity," or "insulin sensitivity ratio," or just "isr") of 1 unit for each 50 by which your glucose exceeds 120.  Please come back for a follow-up appointment in 2 months. check your blood sugar 4 times a day: before the 3 meals, and at bedtime.  also check if you have symptoms of your blood sugar being too high or too low.  please keep a record of the readings and bring it to your next appointment here (or you can bring the meter itself).  You can write it on any piece of paper.  please call us sooner if your blood sugar goes below 70, or if you have a lot of readings over 200.   Please do the A1c when you see Michael Hutchinson next week.  Based on that, we'll reduce the lunch bolus, increase the supper bolus, or both.

## 2018-10-11 NOTE — Progress Notes (Addendum)
Subjective:    Patient ID: Michael Hutchinson, male    DOB: Feb 05, 1975, 43 y.o.   MRN: [id number]  HPI telehealth visit today via phone x 14 minutes.   Alternatives to telehealth are presented to this patient, and the patient agrees to the telehealth visit.  Pt is advised of the cost of the visit, and agrees to this, also.   Patient is at home, and I am at the office.   Persons attending the telehealth visit: the patient and I Pt returns for f/u of diabetes mellitus: DM type: 1 Dx'ed: 6384 Complications: left BKA, polyneuropathy, renal insuff, PAD, and right charcot foot.  Therapy: insulin since dx, and pump rx ([id number] 122), since 2018.  He has a medtronic continuous glucose monitor.   DKA: once, in 2009 Severe hypoglycemia: once, in 2008.   Pancreatitis: never Pancreatic imaging: never Other:  Interval History: He takes these settings:  Please take these pump settings:  basal rate of 0.475 units/hr, 24 HRS per day (when not in auto mode).   mealtime bolus of 1 unit/10 grams carbohydrate.  correction bolus (which some people call "sensitivity," or "insulin sensitivity ratio," or just "isr") of 1 unit for each 50 by which your glucose exceeds 120.  TDD is approx 40 units.  Pt says cbg is mildly low approx QOD.  He says cbg varies from 40-345.  He says it is lowest in the afternoon, and highest at HS and fasting.  He is in auto mode almost all the time.   Past Medical History:  Diagnosis Date  . Charcot's joint of foot 11/25/2013  . Complication of anesthesia    "I wake up angry" (12/31/2015)  . Diabetes mellitus type 1 ([place]) dx'd 1981  . Diabetic ketoacidosis ([place])   . Essential hypertension 05/03/2013  . GERD (gastroesophageal reflux disease)   . High cholesterol   . Hx MRSA infection    Inner thigh and under arm- healed areas  . Meniscus tear   . Shortness of breath    with exertion only    Past Surgical History:  Procedure Laterality Date  . AMPUTATION Left 01/01/2016    Procedure: AMPUTATION BELOW KNEE;  Surgeon: [name], MD;  Location: [facility];  Service: Orthopedics;  Laterality: Left;  . APPLICATION OF WOUND VAC  04/26/2016  . FRACTURE SURGERY    . HERNIA REPAIR    . I&D EXTREMITY Left 04/26/2016   Procedure: IRRIGATION AND DEBRIDEMENT EXTREMITY/Left Leg/Possible Wound Vac;  Surgeon: [name], MD;  Location: [facility];  Service: Orthopedics;  Laterality: Left;  . INCISE AND DRAIN ABCESS Left 04/26/2016  . KNEE ARTHROSCOPY Left ~ 2010  . LAPAROSCOPIC CHOLECYSTECTOMY  2015  . METACARPOPHALANGEAL JOINT ARTHRODESIS Left 06/2012   Fracture left index finger intra-articular MCP joint/notes 06/30/2012  . OPEN REDUCTION INTERNAL FIXATION (ORIF) PROXIMAL PHALANX Left 06/30/2012   Procedure: OPEN REDUCTION INTERNAL FIXATION (ORIF) LEFT INDEX FINGER PROXIMAL PHALANX FRACTURE WITH LIGAMENT REPAIR AS NECESSARY;  Surgeon: [name], MD;  Location: [facility];  Service: Orthopedics;  Laterality: Left;  . UMBILICAL HERNIA REPAIR  2015   "w/gallbladder OR"    Social History   Socioeconomic History  . Marital status: Married    Spouse name: Estill Bamberg  . Number of children: 2  . Years of education: Not on file  . Highest education level: High school graduate  Occupational History  . Occupation: Dealer  Social Needs  . Financial resource strain: Not on file  . Food insecurity  Worry: Not on file    Inability: Not on file  . Transportation needs    Medical: Not on file    Non-medical: Not on file  Tobacco Use  . Smoking status: Never Smoker  . Smokeless tobacco: Former Systems developer    Types: Snuff, Chew  Substance and Sexual Activity  . Alcohol use: Yes    Alcohol/week: 14.0 standard drinks    Types: 14 Cans of beer per week  . Drug use: No  . Sexual activity: Yes  Lifestyle  . Physical activity    Days per week: Not on file    Minutes per session: Not on file  . Stress: Not on file  Relationships  . Social Herbalist on phone: Not on file     Gets together: Not on file    Attends religious service: Not on file    Active member of club or organization: Not on file    Attends meetings of clubs or organizations: Not on file    Relationship status: Not on file  . Intimate partner violence    Fear of current or ex partner: Not on file    Emotionally abused: Not on file    Physically abused: Not on file    Forced sexual activity: Not on file  Other Topics Concern  . Not on file  Social History Narrative   Pt is a right handed married man, lives with his wife and 2 children ages 22 and 81, in a single story house. Pt drinks 6-8, 16oz diet sodas a day.     Current Outpatient Medications on File Prior to Visit  Medication Sig Dispense Refill  . aspirin 81 MG chewable tablet Chew 1 tablet (81 mg total) by mouth daily. 30 tablet 0  . baclofen (LIORESAL) 10 MG tablet Take 1 tablet (10 mg total) by mouth 3 (three) times daily as needed for muscle spasms. 90 each 3  . BAYER MICROLET LANCETS lancets Use as instructed to check 3 times daily. 100 each 12  . Blood Glucose Monitoring Suppl (ONE TOUCH ULTRA SYSTEM KIT) w/Device KIT 1 kit by Does not apply route once. Check blood sugar 3 times daily. Diagnosis Diabetes ICD-10 E11.8    . etodolac (LODINE) 400 MG tablet etodolac 400 mg tablet    . Glatiramer Acetate 40 MG/ML SOSY Inject 40 mg into the skin as directed. Monday, Wednesday and Friday 480 mL 11  . glucagon (GLUCAGON EMERGENCY) 1 MG injection Inject 1 mg into the muscle once as needed. (Patient taking differently: Inject 1 mg into the muscle daily as needed (hypoglycemia). ) 1 each 11  . glucose blood (BAYER CONTOUR NEXT TEST) test strip 1 each by Other route 4 (four) times daily. And lancets 4/day 400 each 3  . HYDROcodone-acetaminophen (NORCO/VICODIN) 5-325 MG tablet hydrocodone 5 mg-acetaminophen 325 mg tablet    . ibuprofen (ADVIL,MOTRIN) 800 MG tablet Take 1 tablet (800 mg total) by mouth 3 (three) times daily. 21 tablet 0  . insulin  lispro (HUMALOG) 100 UNIT/ML injection USE 70 UNITS A DAY VIA INSULIN PUMP. 70 mL 3  . Insulin Syringes, Disposable, U-100 1 ML MISC 3x daily 50 each 2  . losartan (COZAAR) 50 MG tablet 1 po qd 30 tablet 0  . omeprazole (PRILOSEC) 20 MG capsule Take 20 mg twice daily for 4 weeks then once daily 60 capsule 0  . Vitamin D, Ergocalciferol, (DRISDOL) 50000 units CAPS capsule 1 tab every Wednesday and Sunday 24 capsule  10   No current facility-administered medications on file prior to visit.     Allergies  Allergen Reactions  . Influenza Virus Vaccine Split Nausea And Vomiting  . Hydrochlorothiazide Other (See Comments)    Dizziness  . Lisinopril Cough  . Losartan Other (See Comments)    Dizziness    Family History  Problem Relation Age of Onset  . Other Mother   . Cancer Mother        Breast / Bone  . Heart attack Father   . Hypertension Father   . Hyperlipidemia Father   . Diabetes Other   . Alcohol abuse Sister   . Diabetes Maternal Grandfather   . Stroke Paternal Grandmother   . Alcohol abuse Paternal Grandfather     There were no vitals taken for this visit.  Review of Systems Denies LOC    Objective:   Physical Exam     Assessment & Plan:  Type 1 DM: uncertain glycemic control.  Hypoglycemia: this limits aggressiveness of glycemic control   Patient Instructions  Please take these pump settings:  basal rate of 0.4 units/hr, 24 HRS per day (when not in auto mode).   mealtime bolus of 1 unit/11 grams carbohydrate.  correction bolus (which some people call "sensitivity," or "insulin sensitivity ratio," or just "isr") of 1 unit for each 50 by which your glucose exceeds 120.  Please come back for a follow-up appointment in 2 months. check your blood sugar 4 times a day: before the 3 meals, and at bedtime.  also check if you have symptoms of your blood sugar being too high or too low.  please keep a record of the readings and bring it to your next appointment here (or  you can bring the meter itself).  You can write it on any piece of paper.  please call us sooner if your blood sugar goes below 70, or if you have a lot of readings over 200.   Please do the A1c when you see Vaughan Basta next week.  Based on that, we'll reduce the lunch bolus, increase the supper bolus, or both.  Addendum: I reviewed continuous glucose monitor data: glucose varies from 50-270.  It is in general higher PC than AC--also see phone note of 10/17/18

## 2018-10-11 NOTE — Progress Notes (Signed)
Michael Hutchinson (KeyChad Cordial) Rx #: [id number] Glatiramer Acetate 40MG /ML syringes   Form [employer] [place] [employer] Electronic Request Form (CB) Created 2 hours ago Sent to Plan 28 minutes ago Plan Response 28 minutes ago Submit Clinical Questions less than a minute ago Determination Favorable less than a minute ago Message from Plan Effective from 10/11/2018 through 10/09/2021.

## 2018-10-17 ENCOUNTER — Ambulatory Visit (INDEPENDENT_AMBULATORY_CARE_PROVIDER_SITE_OTHER): Payer: BC Managed Care – PPO

## 2018-10-17 ENCOUNTER — Other Ambulatory Visit: Payer: Self-pay

## 2018-10-17 ENCOUNTER — Encounter: Payer: BC Managed Care – PPO | Attending: Endocrinology | Admitting: Nutrition

## 2018-10-17 ENCOUNTER — Telehealth: Payer: Self-pay | Admitting: Nutrition

## 2018-10-17 DIAGNOSIS — E1065 Type 1 diabetes mellitus with hyperglycemia: Secondary | ICD-10-CM

## 2018-10-17 DIAGNOSIS — Z794 Long term (current) use of insulin: Secondary | ICD-10-CM | POA: Insufficient documentation

## 2018-10-17 DIAGNOSIS — E10610 Type 1 diabetes mellitus with diabetic neuropathic arthropathy: Secondary | ICD-10-CM

## 2018-10-17 DIAGNOSIS — E119 Type 2 diabetes mellitus without complications: Secondary | ICD-10-CM | POA: Diagnosis not present

## 2018-10-17 DIAGNOSIS — IMO0002 Reserved for concepts with insufficient information to code with codable children: Secondary | ICD-10-CM

## 2018-10-17 LAB — POCT GLYCOSYLATED HEMOGLOBIN (HGB A1C): Hemoglobin A1C: 6.9 % — AB (ref 4.0–5.6)

## 2018-10-17 NOTE — Progress Notes (Signed)
Pt presents today for nurse visit for completion of A1C. Results sent to Dr. Ellison to review and address.  

## 2018-10-17 NOTE — Telephone Encounter (Signed)
Blood sugar rising every day after breakfast, and even on the days he does not eat breakfast 50-60 points in 1-2 hours at 6-7AM.  He has to bolus 2-4 extra units to get his post breakfast blood sugars down. Also says sensor is not accurate.  Wants a OmnIpod.

## 2018-10-17 NOTE — Telephone Encounter (Addendum)
The A1c is 6.9%, which is good.  I think you should continue the pump. Please continue basal rate of 0.4 units/hr, 24 HRS per day (when not in auto mode).   Please increase mealtime bolus to 1 unit/9 grams carbohydrate.  Continue correction bolus (which some people call "sensitivity," or "insulin sensitivity ratio," or just "isr") of 1 unit for each 50 by which your glucose exceeds 120.  Please come back for a follow-up appointment in 2 months.

## 2018-10-18 DIAGNOSIS — E109 Type 1 diabetes mellitus without complications: Secondary | ICD-10-CM | POA: Diagnosis not present

## 2018-10-18 DIAGNOSIS — Z794 Long term (current) use of insulin: Secondary | ICD-10-CM | POA: Diagnosis not present

## 2018-10-20 NOTE — Patient Instructions (Signed)
Switch to Fiasp insulin For high fat meals, take the insulin after the meal. Call if questions.

## 2018-10-20 NOTE — Progress Notes (Signed)
Patient reports that blood sugars are "all over the place".  Several lows and highs.  Pump downloaded and shown to Dr, Loanne Drilling.  He reports that he counts carbs accurately, takes his bolus before meals (most of the time), and he can not think of why his readings are like this.  He thinks it may be the Novolog insulin and wants to switch to Humalog, which his insurance does not cover.   Per Dr. Cordelia Pen order, the settings were adjusted, and his meal boluses was changed to u/9 grams.   Per Dr Johnette Abraham ok, he was given a vial of Fiasp, and we discussed how this is different from [place] [place].  Some meals are very high fat, TM:LYYTKPT gravy, and he was told to take the [facility] after the meal.  He agreed to do this.   He also wants a different pump, but has 2 years left on his [place].  OmniPod rep. Was called and he said he would try to see if we can get him this under his pharmacy benefit.  He filled out paperwork and this was faxed to [facility]. He had no final questions.

## 2018-10-26 ENCOUNTER — Telehealth: Payer: Self-pay | Admitting: Nutrition

## 2018-10-26 ENCOUNTER — Telehealth: Payer: Self-pay | Admitting: Endocrinology

## 2018-10-26 ENCOUNTER — Other Ambulatory Visit: Payer: Self-pay

## 2018-10-26 DIAGNOSIS — E1065 Type 1 diabetes mellitus with hyperglycemia: Secondary | ICD-10-CM

## 2018-10-26 MED ORDER — FIASP 100 UNIT/ML ~~LOC~~ SOLN: 70.0000 [IU] | Freq: Every day | SUBCUTANEOUS | 3 refills | Status: DC

## 2018-10-26 NOTE — Telephone Encounter (Signed)
please contact patient: Are you taking this med?

## 2018-10-26 NOTE — Telephone Encounter (Signed)
I think Vaughan Basta gave pt fiasp, because she had a sample available.  Pt can have humalog/novolog/fiasp--whichever ins prefers

## 2018-10-26 NOTE — Telephone Encounter (Signed)
Yes, please.

## 2018-10-26 NOTE — Telephone Encounter (Signed)
Do you want to refill Fiasp?

## 2018-10-26 NOTE — Telephone Encounter (Signed)
OK, thank you.

## 2018-10-26 NOTE — Telephone Encounter (Signed)
Patient called to advise that he needs to schedule Dexcom start.  He also stated that he has not yet received the Dexcom sensors.  His call back number is [phone]

## 2018-10-26 NOTE — Telephone Encounter (Signed)
Pt states he prefers [employer]. Do you want the Rx to be sent for the same dosage as Humalog?

## 2018-10-26 NOTE — Telephone Encounter (Signed)
Insulin Aspart, w/Niacinamide, (FIASP) 100 UNIT/ML SOLN 21 mL 3 10/26/2018    Sig - Route: Inject 70 Units into the skin daily. For use in insulin pump - Subcutaneous   Sent to pharmacy as: Insulin Aspart, w/Niacinamide, (FIASP) 100 UNIT/ML Solution   E-Prescribing Status: Receipt confirmed by pharmacy (10/26/2018 4:48 PM EDT)

## 2018-10-26 NOTE — Telephone Encounter (Signed)
Does not appear this pt is taking this medication according to his current medication list. Called pt to clarify this refill request. States it was not [place] needing refilled but rather Fiasp. Pt did state he was NOT taking [place]. According to [facility] [employer] office note from 10/17/18:  Per Dr Johnette Abraham ok, he was given a vial of Fiasp, and we discussed how this is different from Novolog  Please advise

## 2018-10-26 NOTE — Telephone Encounter (Signed)
MEDICATION: [place]  PHARMACY:  [facility]  IS THIS A 90 DAY SUPPLY :    IS PATIENT OUT OF MEDICATION: no  IF NOT; HOW MUCH IS LEFT: @ 3 days work in pump  LAST APPOINTMENT DATE: @10 /07/2018  NEXT APPOINTMENT DATE:@Visit  date not found  DO WE HAVE YOUR PERMISSION TO LEAVE A DETAILED MESSAGE: yes - [phone]  OTHER COMMENTS:    **Let patient know to contact pharmacy at the end of the day to make sure medication is ready. **  ** Please notify patient to allow 48-72 hours to process**  **Encourage patient to contact the pharmacy for refills or they can request refills through [facility]**

## 2018-10-29 ENCOUNTER — Other Ambulatory Visit: Payer: Self-pay | Admitting: Neurology

## 2018-10-29 DIAGNOSIS — Z79899 Other long term (current) drug therapy: Secondary | ICD-10-CM

## 2018-10-29 DIAGNOSIS — R9082 White matter disease, unspecified: Secondary | ICD-10-CM

## 2018-10-29 DIAGNOSIS — G35 Multiple sclerosis: Secondary | ICD-10-CM

## 2018-10-29 NOTE — Telephone Encounter (Signed)
Requested Prescriptions   Pending Prescriptions Disp Refills  . Glatiramer Acetate 40 MG/ML SOSY [Pharmacy Med Name: GLATIRAMER ACETATE 40 MG/ML SOLUTION PREFILLED SYRINGE]  2    Sig: INJECT 40 MG UNDER THE SKIN (SUBCUTANEOUS INJECTION) ON MONDAY, WEDNESDAY, AND FRIDAY   Rx last filled:10/09/18 #480ML 11 REFILLS RX WAS SENT TO LOCAL PHARMACY. PER LAST NOTE PATIENT REQUEST MAIL ORDER RESENDING  Pt last seen: 08/20/18   Follow up appt scheduled:02/20/19   Effective from 10/11/2018 through 10/09/2021

## 2018-10-29 NOTE — Telephone Encounter (Signed)
Another order faxed to [facility] for insurance verification. Patient called and given the Dexcom info.telephonone Number.  He has his OmniPod pump, but wants the dexcom before starting this.  He will call me when he hears back from [facility]

## 2018-10-30 ENCOUNTER — Other Ambulatory Visit: Payer: Self-pay

## 2018-10-30 DIAGNOSIS — E1065 Type 1 diabetes mellitus with hyperglycemia: Secondary | ICD-10-CM

## 2018-10-30 MED ORDER — DEXCOM G6 TRANSMITTER MISC: 1.0000 | 2 refills | Status: DC

## 2018-10-30 MED ORDER — DEXCOM G6 SENSOR MISC: 1.0000 | 2 refills | Status: DC

## 2018-10-30 MED ORDER — DEXCOM G6 RECEIVER DEVI: 1.0000 | 0 refills | Status: DC

## 2018-11-06 ENCOUNTER — Encounter (INDEPENDENT_AMBULATORY_CARE_PROVIDER_SITE_OTHER): Payer: BC Managed Care – PPO | Admitting: Nutrition

## 2018-11-06 ENCOUNTER — Other Ambulatory Visit: Payer: Self-pay

## 2018-11-06 DIAGNOSIS — E10610 Type 1 diabetes mellitus with diabetic neuropathic arthropathy: Secondary | ICD-10-CM

## 2018-11-06 DIAGNOSIS — E1065 Type 1 diabetes mellitus with hyperglycemia: Secondary | ICD-10-CM

## 2018-11-06 DIAGNOSIS — IMO0002 Reserved for concepts with insufficient information to code with codable children: Secondary | ICD-10-CM

## 2018-11-07 ENCOUNTER — Telehealth: Payer: Self-pay | Admitting: Endocrinology

## 2018-11-07 ENCOUNTER — Telehealth: Payer: Self-pay | Admitting: Nutrition

## 2018-11-07 DIAGNOSIS — G894 Chronic pain syndrome: Secondary | ICD-10-CM | POA: Insufficient documentation

## 2018-11-07 NOTE — Progress Notes (Addendum)
Patient was trained on how to use the OmniPod pump.  Settings were entered from his Medtronic pump:  Basal rate: 0.45, I/C: 8, ISF:40, timing 3 hours, target: 120 with correction over 120.  He was shown how to give a bolus, how and when to do a temp basal rate, how to start/stop pump. He re demonstrated how to do each of these correctly.  He filled a pod with Aspart insulin, and applied it to his left upper,outer arm without any difficulty.   He was also instructed on how to use the Dexcom G6 sensor.  He will have the readings going to his phone.  He was sent a invitation to link with [place].  We reviewed how to download the [facility] app, and he agreed to do this when he gets home.   We reviewed all topics on the checklis and he signed off the checklist as understanding all topics, with no final questions    I reviewed continuous glucose monitor data.  Glucose varies from 100-290.  There is no trend throughout the day.  I advised change in pump settings, and sent to Leonia Reader, in the form of a phone message. Renato Shin, MD

## 2018-11-07 NOTE — Telephone Encounter (Signed)
Patient was told the new settings and verbalized back to me correctly.

## 2018-11-07 NOTE — Telephone Encounter (Signed)
Please take these settings: basal rate of 0.5 units/hr, 24 HRS per day (when not in auto mode).   mealtime bolus of 1 unit/11 grams carbohydrate.  correction bolus (which some people call "sensitivity," or "insulin sensitivity ratio," or just "isr") of 1 unit for each 40 by which your glucose exceeds 120.

## 2018-11-07 NOTE — Telephone Encounter (Signed)
Please take these settings: basal rate of 0.7 units/hr, 24 HRS per day (when not in auto mode).  mealtime bolus of 1 unit/9 grams carbohydrate.  correction bolus (which some people call "sensitivity," or "insulin sensitivity ratio," or just "isr") of 1 unit for each 40 by which your glucose exceeds 120.

## 2018-11-07 NOTE — Telephone Encounter (Signed)
Pt. Says blood sugar is now 270.  Advised him to change his pod.  Gave directions below and he reverbalized the change in I/C ratio.

## 2018-11-07 NOTE — Telephone Encounter (Signed)
Michael Hutchinson reports that he has had no difficulty using his OmniPod pump.  But his blood sugars have been running in the low 200s, despite adding correction boluses.  "he finally got his blood sugar to 150 at 4AM, but his morning, it was 209 at 6:30AM.  His Basal rate is 0.5 and his ISF is 40.  He thinks that maybe he should change his basal rate?? Please respond

## 2018-11-07 NOTE — Patient Instructions (Signed)
Review owner's manual. Call if questions.

## 2018-11-13 ENCOUNTER — Other Ambulatory Visit: Payer: Self-pay

## 2018-11-13 ENCOUNTER — Encounter: Payer: BC Managed Care – PPO | Attending: Endocrinology | Admitting: Nutrition

## 2018-11-13 ENCOUNTER — Telehealth: Payer: Self-pay | Admitting: Endocrinology

## 2018-11-13 DIAGNOSIS — Z794 Long term (current) use of insulin: Secondary | ICD-10-CM | POA: Insufficient documentation

## 2018-11-13 DIAGNOSIS — E119 Type 2 diabetes mellitus without complications: Secondary | ICD-10-CM | POA: Insufficient documentation

## 2018-11-13 DIAGNOSIS — E1065 Type 1 diabetes mellitus with hyperglycemia: Secondary | ICD-10-CM | POA: Insufficient documentation

## 2018-11-13 DIAGNOSIS — E10610 Type 1 diabetes mellitus with diabetic neuropathic arthropathy: Secondary | ICD-10-CM | POA: Insufficient documentation

## 2018-11-13 NOTE — Telephone Encounter (Signed)
Pt takes these settings: basal rate of 0.8units/hr, 24 HRS per day (when not in auto mode).  mealtime bolus of 1 unit/10 grams carbohydrate.  correction bolus (which some people call "sensitivity," or "insulin sensitivity ratio," or just "isr") of 1 unit for each40 by which your glucose exceeds 120.   Pt also takes intermitt subcutaneous Humalog, total of approx 10 units/day  Pump TDD is 42 units/day cbg's vary from 100-290.  There is no trend throughout the day.  Please increase to  basal rate of 0.9units/hr, 24 HRS per day (when not in auto mode).  mealtime bolus of 1 unit/9 grams carbohydrate.  correction bolus (which some people call "sensitivity," or "insulin sensitivity ratio," or just "isr") of 1 unit for each40 by which your glucose exceeds 120. Needs f/u ov next week

## 2018-11-13 NOTE — Telephone Encounter (Signed)
Changes were made to the settings per Dr. Cordelia Pen orders below.  Pt. Was told to return to see dr. Loanne Drilling in one week.

## 2018-11-14 NOTE — Addendum Note (Signed)
Addended by: Renato Shin on: 11/14/2018 04:53 PM   Modules accepted: Level of Service

## 2018-11-15 NOTE — Patient Instructions (Signed)
Call if blood sugars remain high.

## 2018-11-15 NOTE — Progress Notes (Signed)
Mr. Saltzman is here today because his blood sugars are high, despite increasing his basal rate.  Pump was downloaded and put on Dr. Cordelia Pen desk to review.  He denies   Blood sugars in the 200s despite giving correction doses.  He denies drinking sweet drinks and reports taking his boluses before eating.   Per Dr. Cordelia Pen order, Mr. Hoffmann changed his basal rates and I/C ratio as directed to 0.8u/hr and 1u/ 10 grams.  He had no final questions.

## 2018-11-26 DIAGNOSIS — Z89612 Acquired absence of left leg above knee: Secondary | ICD-10-CM | POA: Diagnosis not present

## 2018-12-05 DIAGNOSIS — E109 Type 1 diabetes mellitus without complications: Secondary | ICD-10-CM | POA: Diagnosis not present

## 2018-12-05 DIAGNOSIS — Z794 Long term (current) use of insulin: Secondary | ICD-10-CM | POA: Diagnosis not present

## 2018-12-11 IMAGING — XA DG FLUORO GUIDE LUMBAR PUNCTURE
2 series · 2 of 2 positions shown · non-contrast
Comparison: none

CLINICAL DATA: Visual changes

[Series 1: ortho adipose · 1 of 1 slices shown (1 of 2)]
[im 1/1]
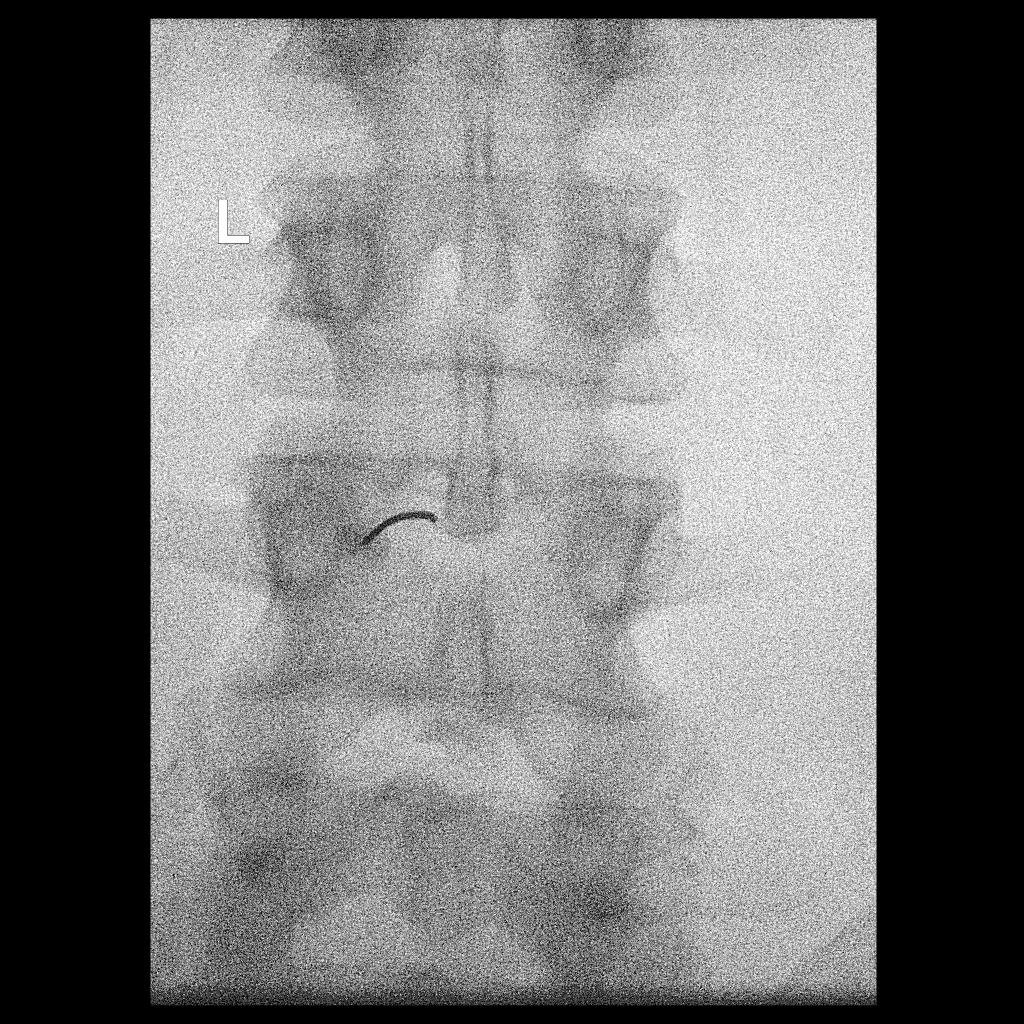

[Series 2: ortho adipose · 1 of 1 slices shown (2 of 2)]
[im 1/1]
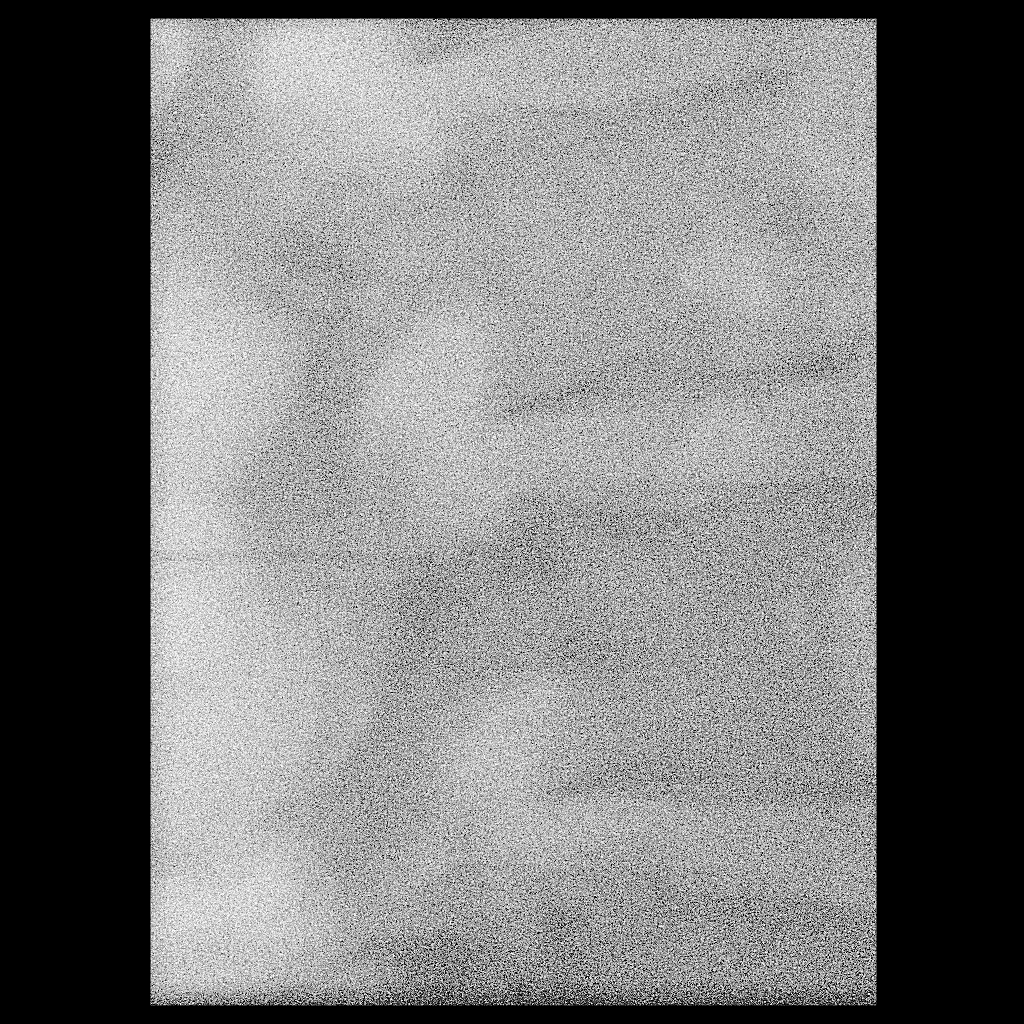

[2 of 2 positions shown; findings below may reference images not displayed]

EXAM:
DIAGNOSTIC LUMBAR PUNCTURE UNDER FLUOROSCOPIC GUIDANCE

FLUOROSCOPY TIME:  32 seconds.  7.9 mGy.

PROCEDURE:
Informed consent was obtained from the patient prior to the
procedure, including potential complications of headache, allergy,
and pain. With the patient prone, the lower back was prepped with
Betadine. 1% Lidocaine was used for local anesthesia. Lumbar
puncture was performed at the L2-3 level using a 6 inch 20 gauge
needle with return of pink CSF with an opening pressure of 22 cm
water. The third and fourth tubes were clear. Eightml of CSF were
obtained for laboratory studies. The patient tolerated the procedure
well and there were no apparent complications.

COMPLICATIONS:
None
IMPRESSION: Successful lumbar puncture for fluid and pressure.

## 2018-12-24 ENCOUNTER — Telehealth: Payer: Self-pay | Admitting: Family Medicine

## 2018-12-24 NOTE — Telephone Encounter (Signed)
Yes, thank you.  If pt is in excruciating pain, he will need to be seen immediately, will need xrays etc which we can't do in office.

## 2018-12-24 NOTE — Telephone Encounter (Signed)
Patient wife called stating patient fell Sunday morning on a slippery surface and hurt his butt bone and back. Patient is in excruciating pain this morning and requesting to be seen. I advised patient to go to ED for evaluation. Patient wife verbalized understanding. AS, CMA

## 2018-12-26 DIAGNOSIS — M533 Sacrococcygeal disorders, not elsewhere classified: Secondary | ICD-10-CM | POA: Insufficient documentation

## 2018-12-26 DIAGNOSIS — M47816 Spondylosis without myelopathy or radiculopathy, lumbar region: Secondary | ICD-10-CM | POA: Insufficient documentation

## 2018-12-26 DIAGNOSIS — M545 Low back pain: Secondary | ICD-10-CM | POA: Diagnosis not present

## 2019-01-01 DIAGNOSIS — Z89512 Acquired absence of left leg below knee: Secondary | ICD-10-CM | POA: Diagnosis not present

## 2019-01-10 DIAGNOSIS — E109 Type 1 diabetes mellitus without complications: Secondary | ICD-10-CM | POA: Diagnosis not present

## 2019-01-10 DIAGNOSIS — Z794 Long term (current) use of insulin: Secondary | ICD-10-CM | POA: Diagnosis not present

## 2019-01-16 ENCOUNTER — Other Ambulatory Visit: Payer: Self-pay

## 2019-01-16 DIAGNOSIS — Z79899 Other long term (current) drug therapy: Secondary | ICD-10-CM

## 2019-01-16 DIAGNOSIS — G35 Multiple sclerosis: Secondary | ICD-10-CM

## 2019-01-16 DIAGNOSIS — R9082 White matter disease, unspecified: Secondary | ICD-10-CM

## 2019-01-17 MED ORDER — GLATIRAMER ACETATE 40 MG/ML ~~LOC~~ SOSY: PREFILLED_SYRINGE | SUBCUTANEOUS | 2 refills | Status: DC

## 2019-01-23 ENCOUNTER — Telehealth: Payer: Self-pay | Admitting: Nutrition

## 2019-01-23 NOTE — Telephone Encounter (Signed)
Needs f/u next avail.  VV is OK.

## 2019-01-23 NOTE — Telephone Encounter (Signed)
Patient has been scheduled for 01/25/2019

## 2019-01-23 NOTE — Telephone Encounter (Signed)
Per Dr. Everardo All, pt will require an appt to further discuss below concerns. Routing this message to the front desk for scheduling purposes.

## 2019-01-23 NOTE — Telephone Encounter (Signed)
Patient reports that his blood sugars are rising, sometimes 100 points during the night. Last night at 10PM blood sugar was 155.  He did a correction dose of 1u and FBS this morning was 183.  He says it is usually around 250 most mornings.  Blood sugars are good during the daytime, after he gets the FBS down.  Also says that 1 pod is not lasting but 2.5 days. Can he try the U-200 insulin that you suggested the last time he was in? Basal rate is currently 1.25u/hr.

## 2019-01-25 ENCOUNTER — Encounter: Payer: Self-pay | Admitting: Endocrinology

## 2019-01-25 ENCOUNTER — Other Ambulatory Visit: Payer: Self-pay

## 2019-01-25 ENCOUNTER — Ambulatory Visit (INDEPENDENT_AMBULATORY_CARE_PROVIDER_SITE_OTHER): Payer: BC Managed Care – PPO | Admitting: Endocrinology

## 2019-01-25 DIAGNOSIS — E1051 Type 1 diabetes mellitus with diabetic peripheral angiopathy without gangrene: Secondary | ICD-10-CM

## 2019-01-25 MED ORDER — FARXIGA 5 MG PO TABS: 5.0000 mg | ORAL_TABLET | Freq: Every day | ORAL | 11 refills | Status: DC

## 2019-01-25 NOTE — Progress Notes (Signed)
   Subjective:    Patient ID: Michael Hutchinson, male    DOB: August 26, 1975, 44 y.o.   MRN: [id number]  HPI telehealth visit today via doxy video visit.  Alternatives to telehealth are presented to this patient, and the patient agrees to the telehealth visit. Pt is advised of the cost of the visit, and agrees to this, also.   Patient is at home, and I am at the office.   Persons attending the telehealth visit: the patient and I Pt returns for f/u of diabetes mellitus: DM type: 1 Dx'ed: 1981 Complications: left BKA, polyneuropathy, renal insuff, PAD, and right charcot foot.  Therapy: insulin since dx, and pump rx (medtronic 670), since 2018.  He has a medtronic continuous glucose monitor.   DKA: once, in 2009 Severe hypoglycemia: once, in 2008.   Pancreatitis: never Pancreatic imaging: never Other: he did not tolerate Ozempic (nausea) Interval History: He takes these settings:  basal rate of 0.4 units/hr, 24 HRS per day (when not in auto mode).   mealtime bolus of 1 unit/11 grams carbohydrate.  correction bolus (which some people call "sensitivity," or "insulin sensitivity ratio," or just "isr") of 1 unit for each 50 by which your glucose exceeds 120.  TDD is approx 75 units.  He wants to add another med, to reduce insulin need, and help with weight loss.  He says cbg varies from 95-300.  He says this is worse than last time.    Review of Systems He denies hypoglycemia.      Objective:   Physical Exam   Lab Results  Component Value Date   CREATININE 1.10 07/02/2018   BUN 20 07/02/2018   NA 134 (L) 07/02/2018   K 4.5 07/02/2018   CL 99 07/02/2018   CO2 30 07/02/2018       Assessment & Plan:  Type 1 DM: worse Obesity: Marcelline Deist might help  Patient Instructions  Please take these pump settings:  basal rate of 0.4 units/hr, 24 HRS per day (when not in auto mode).   mealtime bolus of 1 unit/11 grams carbohydrate.  correction bolus (which some people call "sensitivity," or  "insulin sensitivity ratio," or just "isr") of 1 unit for each 50 by which your glucose exceeds 120.  I have sent a prescription to your pharmacy, to add "Marcelline Deist."   Please come back for a follow-up appointment in 1 month. check your blood sugar 4 times a day: before the 3 meals, and at bedtime.  also check if you have symptoms of your blood sugar being too high or too low.  please keep a record of the readings and bring it to your next appointment here (or you can bring the meter itself).  You can write it on any piece of paper.  please call us sooner if your blood sugar goes below 70, or if you have a lot of readings over 200.

## 2019-01-25 NOTE — Patient Instructions (Addendum)
Please take these pump settings:  basal rate of 0.4 units/hr, 24 HRS per day (when not in auto mode).   mealtime bolus of 1 unit/11 grams carbohydrate.  correction bolus (which some people call "sensitivity," or "insulin sensitivity ratio," or just "isr") of 1 unit for each 50 by which your glucose exceeds 120.  I have sent a prescription to your pharmacy, to add "Marcelline Deist."   Please come back for a follow-up appointment in 1 month. check your blood sugar 4 times a day: before the 3 meals, and at bedtime.  also check if you have symptoms of your blood sugar being too high or too low.  please keep a record of the readings and bring it to your next appointment here (or you can bring the meter itself).  You can write it on any piece of paper.  please call us sooner if your blood sugar goes below 70, or if you have a lot of readings over 200.

## 2019-02-18 ENCOUNTER — Telehealth: Payer: Self-pay

## 2019-02-18 ENCOUNTER — Other Ambulatory Visit (INDEPENDENT_AMBULATORY_CARE_PROVIDER_SITE_OTHER): Payer: BC Managed Care – PPO

## 2019-02-18 ENCOUNTER — Other Ambulatory Visit: Payer: Self-pay | Admitting: Endocrinology

## 2019-02-18 ENCOUNTER — Encounter: Payer: Self-pay | Admitting: Endocrinology

## 2019-02-18 ENCOUNTER — Ambulatory Visit (INDEPENDENT_AMBULATORY_CARE_PROVIDER_SITE_OTHER): Payer: BC Managed Care – PPO | Admitting: Endocrinology

## 2019-02-18 ENCOUNTER — Other Ambulatory Visit: Payer: Self-pay

## 2019-02-18 DIAGNOSIS — L97424 Non-pressure chronic ulcer of left heel and midfoot with necrosis of bone: Secondary | ICD-10-CM

## 2019-02-18 DIAGNOSIS — E1029 Type 1 diabetes mellitus with other diabetic kidney complication: Secondary | ICD-10-CM | POA: Diagnosis not present

## 2019-02-18 DIAGNOSIS — E1051 Type 1 diabetes mellitus with diabetic peripheral angiopathy without gangrene: Secondary | ICD-10-CM | POA: Diagnosis not present

## 2019-02-18 DIAGNOSIS — E1065 Type 1 diabetes mellitus with hyperglycemia: Secondary | ICD-10-CM

## 2019-02-18 DIAGNOSIS — E10621 Type 1 diabetes mellitus with foot ulcer: Secondary | ICD-10-CM

## 2019-02-18 LAB — POCT GLYCOSYLATED HEMOGLOBIN (HGB A1C): Hemoglobin A1C: 7.1 % — AB (ref 4.0–5.6)

## 2019-02-18 MED ORDER — HUMALOG KWIKPEN 200 UNIT/ML ~~LOC~~ SOPN: 90.0000 [IU] | PEN_INJECTOR | Freq: Every day | SUBCUTANEOUS | 11 refills | Status: DC

## 2019-02-18 MED ORDER — INSULIN ASPART 100 UNIT/ML ~~LOC~~ SOLN: SUBCUTANEOUS | 3 refills | Status: DC

## 2019-02-18 NOTE — Addendum Note (Signed)
Addended by: Romero Belling on: 02/18/2019 12:01 PM   Modules accepted: Orders

## 2019-02-18 NOTE — Telephone Encounter (Signed)
Please review and advise.

## 2019-02-18 NOTE — Telephone Encounter (Signed)
PRIOR AUTHORIZATION  PA initiation date: 02/18/19   Insurance Company: [employer] Submission completed electronically through [employer] My Meds: Yes  Will await insurance response re: approval/denial.  Michael Hutchinson Key: [id number] help? Call us at [phone] Status Sent to [facility] [name] 200UNIT/ML pen-injectors Form [employer] [place] Commercial Electronic Request Form (CB) Michael Hutchinson (Key: [facility])  Your information has been submitted to [facility] [place]. [employer] will review the request and fax you a determination directly, typically within 3 business days of your submission once all necessary information is received.  If [employer] [place] has not responded in 3 business days or if you have any questions about your submission, contact [employer] [place] at [phone].

## 2019-02-18 NOTE — Telephone Encounter (Signed)
Patients wife called today stating Michael Hutchinson is making her husband sick (naseaus/dizzy)  and she is requesting patient take higher dosage of insulin instead of taking the pill good call back number is [phone]

## 2019-02-18 NOTE — Progress Notes (Deleted)
NEUROLOGY FOLLOW UP OFFICE NOTE  Michael Hutchinson [id number]  HISTORY OF PRESENT ILLNESS: Michael Hutchinson is a 44 year old right-handed Caucasian man with type 1 diabetes with history of DKA, hypertension, Charcot's joint of foot, left below the knee amputation, hyperlipidemia and GERD who follows up for multiple sclerosis.   UPDATE: Current disease modifying therapy:Copaxone Other current medications:baclofen '10mg'$  TID PRN; D3 50,000 IU twice weekly (have missed some doses)  Vision:Vision has cleared. Motor: Sometimes notes tremors in either hand, primarily the right.   Sensory: No numbness. Pain: Pain in legs and low back, right shoulder pain, increased joint stiffness. Gait: No change Bowel/Bladder: No issues. Fatigue: yes.  He easily sleeps when he sits down in his recliner.  He gets about 4 1/2 to 6 hours of sleep a night.  Has not been checked for sleep apnea.  His wife has told him he does not seem to snore or gasp for air in his sleep.   Cognition: no Mood: No depression  07/02/18 LABS:  Na 134, K 4.5, Cl 99, CO2 30, glucose 111, BUN 20, Cr 1.10; Hgb A1c 7.2.  HISTORY: In October 2016, he developed a right 6th cranial nerve palsy which was thought to be due to his uncontrolled type 1 diabetes.  In March 2017, he developed blurred vision in the left eye. He saw ophthalmologist Dr. Julian Reil who diagnosed optic neuritis. MRI of the brain and orbits with and without contrast from 05/11/2015 revealed "2 T2hyperintense lesions within the left hemisphere adjacent to the posterior horn of the left lateral ventricle ", a nonspecific finding and without abnormal contrast enhancement.  In March 2019, he developed disconjugate gaze and droopy left eyelid. He saw the eye doctor who diagnosed a left 3rd nerve palsy. He was evaluated in the ED at [facility] on 03/28/2017. CTA of the head was negative for aneurysm. MRI of the brain with and without contrast  demonstrated multiple T2/flair hyperintense foci involving the periventricular, deep and subcortical white matter, progressed compared to prior MRI from 2017. In-house neurology believed his findings were more likely due to a diabetic 3rd nerve palsy rather than MS. He was advised to start aspirin 81 mg daily and follow-up with outpatient neurology. He subsequently underwent a work-up for multiple sclerosis. MRI of the cervical spine with and without contrast from 05/04/2017 revealed no cord lesions. He underwent lumbar puncture on 05/19/2017 for CSF analysis which revealed elevated IgG index of 0.79 and 4 oligoclonal bands not present in the corresponding serum. Cell count was 1, elevated protein 157, elevated glucose 100, negative myelin basic protein, ACE 4, Gram stain and culture negative.  02/25/2018 MRI BRAIN W WO: no change since prior study from March 2019.  Past disease modifying therapy: Tecfidera (GI upset)  PAST MEDICAL HISTORY: Past Medical History:  Diagnosis Date  . Charcot's joint of foot 11/25/2013  . Complication of anesthesia    "I wake up angry" (12/31/2015)  . Diabetes mellitus type 1 ([place]) dx'd 1981  . Diabetic ketoacidosis ([place])   . Essential hypertension 05/03/2013  . GERD (gastroesophageal reflux disease)   . High cholesterol   . Hx MRSA infection    Inner thigh and under arm- healed areas  . Meniscus tear   . Shortness of breath    with exertion only    MEDICATIONS: Current Outpatient Medications on File Prior to Visit  Medication Sig Dispense Refill  . aspirin 81 MG chewable tablet Chew 1 tablet (81 mg total) by  mouth daily. 30 tablet 0  . baclofen (LIORESAL) 10 MG tablet Take 1 tablet (10 mg total) by mouth 3 (three) times daily as needed for muscle spasms. 90 each 3  . BAYER MICROLET LANCETS lancets Use as instructed to check 3 times daily. 100 each 12  . Blood Glucose Monitoring Suppl (ONE TOUCH ULTRA SYSTEM KIT) w/Device KIT 1 kit by Does not apply  route once. Check blood sugar 3 times daily. Diagnosis Diabetes ICD-10 E11.8    . Continuous Blood Gluc Receiver ([place]) [place] 1 each by Does not apply route See admin instructions. For use with continuous glucose monitoring system; E11.9 1 each 0  . Continuous Blood Gluc Sensor (DEXCOM G6 SENSOR) MISC 1 each by Does not apply route See admin instructions. For use with continuous glucose monitoring system. Change every 10 days; E11.9 3 each 2  . Continuous Blood Gluc Transmit (DEXCOM G6 TRANSMITTER) MISC 1 each by Does not apply route See admin instructions. For use with continuous glucose monitoring system; E11.9 1 each 2  . etodolac (LODINE) 400 MG tablet etodolac 400 mg tablet    . Glatiramer Acetate 40 MG/ML SOSY INJECT 40 MG UNDER THE SKIN (SUBCUTANEOUS INJECTION) ON MONDAY, WEDNESDAY, AND FRIDAY 480 mL 2  . glucagon (GLUCAGON EMERGENCY) 1 MG injection Inject 1 mg into the muscle once as needed. (Patient taking differently: Inject 1 mg into the muscle daily as needed (hypoglycemia). ) 1 each 11  . glucose blood (BAYER CONTOUR NEXT TEST) test strip 1 each by Other route 4 (four) times daily. And lancets 4/day 400 each 3  . HYDROcodone-acetaminophen (NORCO/VICODIN) 5-325 MG tablet hydrocodone 5 mg-acetaminophen 325 mg tablet    . ibuprofen (ADVIL,MOTRIN) 800 MG tablet Take 1 tablet (800 mg total) by mouth 3 (three) times daily. 21 tablet 0  . Insulin Aspart, w/Niacinamide, (FIASP) 100 UNIT/ML SOLN Inject 70 Units into the skin daily. For use in insulin pump 21 mL 3  . Insulin Syringes, Disposable, U-100 1 ML MISC 3x daily 50 each 2  . losartan (COZAAR) 50 MG tablet 1 po qd 30 tablet 0  . omeprazole (PRILOSEC) 20 MG capsule Take 20 mg twice daily for 4 weeks then once daily 60 capsule 0  . Vitamin D, Ergocalciferol, (DRISDOL) 50000 units CAPS capsule 1 tab every Wednesday and Sunday 24 capsule 10   No current facility-administered medications on file prior to visit.     ALLERGIES: Allergies  Allergen Reactions  . Influenza Virus Vaccine Split Nausea And Vomiting  . Hydrochlorothiazide Other (See Comments)    Dizziness  . Lisinopril Cough  . Losartan Other (See Comments)    Dizziness    FAMILY HISTORY: Family History  Problem Relation Age of Onset  . Other Mother   . Cancer Mother        Breast / Bone  . Heart attack Father   . Hypertension Father   . Hyperlipidemia Father   . Diabetes Other   . Alcohol abuse Sister   . Diabetes Maternal Grandfather   . Stroke Paternal Grandmother   . Alcohol abuse Paternal Grandfather    SOCIAL HISTORY: Social History   Socioeconomic History  . Marital status: Married    Spouse name: Estill Bamberg  . Number of children: 2  . Years of education: Not on file  . Highest education level: High school graduate  Occupational History  . Occupation: Dealer  Tobacco Use  . Smoking status: Never Smoker  . Smokeless tobacco: Former Systems developer  Types: Snuff, Chew  Substance and Sexual Activity  . Alcohol use: Yes    Alcohol/week: 14.0 standard drinks    Types: 14 Cans of beer per week  . Drug use: No  . Sexual activity: Yes  Other Topics Concern  . Not on file  Social History Narrative   Pt is a right handed married man, lives with his wife and 2 children ages 62 and 19, in a single story house. Pt drinks 6-8, 16oz diet sodas a day.    Social Determinants of Health   Financial Resource Strain:   . Difficulty of Paying Living Expenses: Not on file  Food Insecurity:   . Worried About Charity fundraiser in the Last Year: Not on file  . Ran Out of Food in the Last Year: Not on file  Transportation Needs:   . Lack of Transportation (Medical): Not on file  . Lack of Transportation (Non-Medical): Not on file  Physical Activity:   . Days of Exercise per Week: Not on file  . Minutes of Exercise per Session: Not on file  Stress:   . Feeling of Stress : Not on file  Social Connections:   . Frequency of  Communication with Friends and Family: Not on file  . Frequency of Social Gatherings with Friends and Family: Not on file  . Attends Religious Services: Not on file  . Active Member of Clubs or Organizations: Not on file  . Attends Archivist Meetings: Not on file  . Marital Status: Not on file  Intimate Partner Violence:   . Fear of Current or Ex-Partner: Not on file  . Emotionally Abused: Not on file  . Physically Abused: Not on file  . Sexually Abused: Not on file    REVIEW OF SYSTEMS: Constitutional: No fevers, chills, or sweats, no generalized fatigue, change in appetite Eyes: No visual changes, double vision, eye pain Ear, nose and throat: No hearing loss, ear pain, nasal congestion, sore throat Cardiovascular: No chest pain, palpitations Respiratory:  No shortness of breath at rest or with exertion, wheezes GastrointestinaI: No nausea, vomiting, diarrhea, abdominal pain, fecal incontinence Genitourinary:  No dysuria, urinary retention or frequency Musculoskeletal:  No neck pain, back pain Integumentary: No rash, pruritus, skin lesions Neurological: as above Psychiatric: No depression, insomnia, anxiety Endocrine: No palpitations, fatigue, diaphoresis, mood swings, change in appetite, change in weight, increased thirst Hematologic/Lymphatic:  No purpura, petechiae. Allergic/Immunologic: no itchy/runny eyes, nasal congestion, recent allergic reactions, rashes  PHYSICAL EXAM: *** General: No acute distress.  Patient appears ***-groomed.   Head:  Normocephalic/atraumatic Eyes:  Fundi examined but not visualized Neck: supple, no paraspinal tenderness, full range of motion Heart:  Regular rate and rhythm Lungs:  Clear to auscultation bilaterally Back: No paraspinal tenderness Neurological Exam: alert and oriented to person, place, and time. Attention span and concentration intact, recent and remote memory intact, fund of knowledge intact.  Speech fluent and not  dysarthric, language intact.  CN II-XII intact. Bulk and tone normal, muscle strength 5/5 throughout.  Sensation to ***.  Deep tendon reflexes 1+ throughout.  Finger to nose testing intact.  Gait normal.  IMPRESSION: 1.  Multiple sclerosis.  While ischemic cranial nerve palsies are seen in uncontrolled diabetes, given the 3 distinct episodes of various cranial nerve palsies, the appearance of white matter lesions on brain MRI, and CSF analysis demonstrated for oligoclonal bands not present in serum, I suspect MS.  He does have significantly elevated CSF protein but does not exhibit  any sign of infection.  Diabetes may be contributor to this. 2.  Morbid obesity (BMI ***) 3.  Hypertension  PLAN: 1.  Copaxone 2.  D3 50,000 IU twice weekly 3.  ***  Metta Clines, DO  CC: ***

## 2019-02-18 NOTE — Patient Instructions (Addendum)
Please take these pump settings:  basal rate of 1.1 units/hr, 24 HRS per day (when not in auto mode).   mealtime bolus of 1 unit/10 grams carbohydrate.  correction bolus (which some people call "sensitivity," or "insulin sensitivity ratio," or just "isr") of 1 unit for each 50 by which your glucose exceeds 120.  Please come back for a follow-up appointment in 1 month.  check your blood sugar 4 times a day: before the 3 meals, and at bedtime.  also check if you have symptoms of your blood sugar being too high or too low.  please keep a record of the readings and bring it to your next appointment here (or you can bring the meter itself).  You can write it on any piece of paper.  please call us sooner if your blood sugar goes below 70, or if you have a lot of readings over 200.

## 2019-02-18 NOTE — Telephone Encounter (Signed)
Pt states he was never on Fiasp and states he refuses to take more insulin. States he refuses to speak with me any further and insisted to have a VV today. Scheduled as requested

## 2019-02-18 NOTE — Telephone Encounter (Signed)
please contact patient: D/c farxiga Please continue the same pump settings I'll see you next time.

## 2019-02-18 NOTE — Telephone Encounter (Signed)
We changed to U-200, due to the volume capacity of the pump.  I am unaware that this is available in a vial, so I rx'ed in pens.  Pt is aware.

## 2019-02-18 NOTE — Telephone Encounter (Signed)
Inject 70 Units into the skin daily. For use in insulin pump  Above are the orders for [facility]. After speaking with pt, states he has already stopped [place]. Pt is also stating he is now using MORE than 70 units per day to control CBG's. Further added that he no longer wishes to be on Fiasp and would prefer another insulin. Routing to Dr. Everardo All to address.

## 2019-02-18 NOTE — Telephone Encounter (Signed)
Following ordered today:  Outpatient Medication Detail   Disp Refills Start End   Insulin Lispro ([name]) 200 UNIT/ML SOPN 10 pen 11 02/18/2019    Sig - Route: Inject 90 Units into the skin daily. For use in pump, total of 90 units per day, and pen needles 2/day - Subcutaneous   Sent to pharmacy as: Insulin Lispro ([name]) 200 UNIT/ML Solution Pen-injector   E-Prescribing Status: Receipt confirmed by pharmacy (02/18/2019 3:54 PM EST)    Routing to Dr. Everardo All for clarification d/t pt being on pump.

## 2019-02-18 NOTE — Addendum Note (Signed)
Addended by: Romero Belling on: 02/18/2019 12:51 PM   Modules accepted: Orders

## 2019-02-18 NOTE — Telephone Encounter (Signed)
OK, I changed to novolog, increased the amount, and sent to pharmacy

## 2019-02-18 NOTE — Progress Notes (Signed)
Subjective:    Patient ID: Michael Hutchinson, male    DOB: 08-Apr-1975, 44 y.o.   MRN: [id number]  HPI telehealth visit today via doxy video visit.  Alternatives to telehealth are presented to this patient, and the patient agrees to the telehealth visit. Pt is advised of the cost of the visit, and agrees to this, also.   Patient is at home, and I am at the office.   Persons attending the telehealth visit: the patient and I Pt returns for f/u of diabetes mellitus: DM type: 1 Dx'ed: 0947 Complications: left BKA, polyneuropathy, renal insuff, PAD, and right charcot foot.  Therapy: insulin since dx, and pump rx since 2018 (now Omnipod).  He has a medtronic continuous glucose monitor.   DKA: once, in 2009 Severe hypoglycemia: once, in 2008.   Pancreatitis: never Pancreatic imaging: never Other: he did not tolerate Ozempic or farxiga (nausea with both).  Interval History: He takes these settings:  basal rate of 1.25 units/hr, 24 HRS per day.   mealtime bolus of 1 unit/11 grams carbohydrate.  correction bolus (which some people call "sensitivity," or "insulin sensitivity ratio," or just "isr") of 1 unit for each 50 by which your glucose exceeds 120.  TDD is approx 75 units.  He wants to change to U-200, because he is having to change Omnipod more frequently.  he says cbg varies from 65-250.  It is in general higher as the day goes on.   Past Medical History:  Diagnosis Date  . Charcot's joint of foot 11/25/2013  . Complication of anesthesia    "I wake up angry" (12/31/2015)  . Diabetes mellitus type 1 ([place]) dx'd 1981  . Diabetic ketoacidosis ([place])   . Essential hypertension 05/03/2013  . GERD (gastroesophageal reflux disease)   . High cholesterol   . Hx MRSA infection    Inner thigh and under arm- healed areas  . Meniscus tear   . Shortness of breath    with exertion only    Past Surgical History:  Procedure Laterality Date  . AMPUTATION Left 01/01/2016   Procedure: AMPUTATION  BELOW KNEE;  Surgeon: [name], MD;  Location: [place];  Service: Orthopedics;  Laterality: Left;  . APPLICATION OF WOUND VAC  04/26/2016  . FRACTURE SURGERY    . HERNIA REPAIR    . I & D EXTREMITY Left 04/26/2016   Procedure: IRRIGATION AND DEBRIDEMENT EXTREMITY/Left Leg/Possible Wound Vac;  Surgeon: [name], MD;  Location: [place];  Service: Orthopedics;  Laterality: Left;  . INCISE AND DRAIN ABCESS Left 04/26/2016  . KNEE ARTHROSCOPY Left ~ 2010  . LAPAROSCOPIC CHOLECYSTECTOMY  2015  . METACARPOPHALANGEAL JOINT ARTHRODESIS Left 06/2012   Fracture left index finger intra-articular MCP joint/notes 06/30/2012  . OPEN REDUCTION INTERNAL FIXATION (ORIF) PROXIMAL PHALANX Left 06/30/2012   Procedure: OPEN REDUCTION INTERNAL FIXATION (ORIF) LEFT INDEX FINGER PROXIMAL PHALANX FRACTURE WITH LIGAMENT REPAIR AS NECESSARY;  Surgeon: [name], MD;  Location: [place];  Service: Orthopedics;  Laterality: Left;  . UMBILICAL HERNIA REPAIR  2015   "w/gallbladder OR"    Social History   Socioeconomic History  . Marital status: Married    Spouse name: Estill Bamberg  . Number of children: 2  . Years of education: Not on file  . Highest education level: High school graduate  Occupational History  . Occupation: Dealer  Tobacco Use  . Smoking status: Never Smoker  . Smokeless tobacco: Former Systems developer    Types: Snuff, Chew  Substance and Sexual Activity  .  Alcohol use: Yes    Alcohol/week: 14.0 standard drinks    Types: 14 Cans of beer per week  . Drug use: No  . Sexual activity: Yes  Other Topics Concern  . Not on file  Social History Narrative   Pt is a right handed married man, lives with his wife and 2 children ages 86 and 38, in a single story house. Pt drinks 6-8, 16oz diet sodas a day.    Social Determinants of Health   Financial Resource Strain:   . Difficulty of Paying Living Expenses: Not on file  Food Insecurity:   . Worried About Charity fundraiser in the Last Year: Not on file  . Ran  Out of Food in the Last Year: Not on file  Transportation Needs:   . Lack of Transportation (Medical): Not on file  . Lack of Transportation (Non-Medical): Not on file  Physical Activity:   . Days of Exercise per Week: Not on file  . Minutes of Exercise per Session: Not on file  Stress:   . Feeling of Stress : Not on file  Social Connections:   . Frequency of Communication with Friends and Family: Not on file  . Frequency of Social Gatherings with Friends and Family: Not on file  . Attends Religious Services: Not on file  . Active Member of Clubs or Organizations: Not on file  . Attends Archivist Meetings: Not on file  . Marital Status: Not on file  Intimate Partner Violence:   . Fear of Current or Ex-Partner: Not on file  . Emotionally Abused: Not on file  . Physically Abused: Not on file  . Sexually Abused: Not on file    Current Outpatient Medications on File Prior to Visit  Medication Sig Dispense Refill  . aspirin 81 MG chewable tablet Chew 1 tablet (81 mg total) by mouth daily. 30 tablet 0  . baclofen (LIORESAL) 10 MG tablet Take 1 tablet (10 mg total) by mouth 3 (three) times daily as needed for muscle spasms. 90 each 3  . BAYER MICROLET LANCETS lancets Use as instructed to check 3 times daily. 100 each 12  . Blood Glucose Monitoring Suppl (ONE TOUCH ULTRA SYSTEM KIT) w/Device KIT 1 kit by Does not apply route once. Check blood sugar 3 times daily. Diagnosis Diabetes ICD-10 E11.8    . etodolac (LODINE) 400 MG tablet etodolac 400 mg tablet    . Glatiramer Acetate 40 MG/ML SOSY INJECT 40 MG UNDER THE SKIN (SUBCUTANEOUS INJECTION) ON MONDAY, WEDNESDAY, AND FRIDAY 480 mL 2  . glucagon (GLUCAGON EMERGENCY) 1 MG injection Inject 1 mg into the muscle once as needed. (Patient taking differently: Inject 1 mg into the muscle daily as needed (hypoglycemia). ) 1 each 11  . glucose blood (BAYER CONTOUR NEXT TEST) test strip 1 each by Other route 4 (four) times daily. And lancets  4/day 400 each 3  . HYDROcodone-acetaminophen (NORCO/VICODIN) 5-325 MG tablet hydrocodone 5 mg-acetaminophen 325 mg tablet    . ibuprofen (ADVIL,MOTRIN) 800 MG tablet Take 1 tablet (800 mg total) by mouth 3 (three) times daily. 21 tablet 0  . Insulin Syringes, Disposable, U-100 1 ML MISC 3x daily 50 each 2  . losartan (COZAAR) 50 MG tablet 1 po qd 30 tablet 0  . omeprazole (PRILOSEC) 20 MG capsule Take 20 mg twice daily for 4 weeks then once daily 60 capsule 0  . Vitamin D, Ergocalciferol, (DRISDOL) 50000 units CAPS capsule 1 tab every Wednesday and Sunday  24 capsule 10  . Continuous Blood Gluc Receiver ([place]) [place] 1 each by Does not apply route See admin instructions. For use with continuous glucose monitoring system; E11.9 (Patient not taking: Reported on 02/18/2019) 1 each 0  . Continuous Blood Gluc Transmit (DEXCOM G6 TRANSMITTER) MISC 1 each by Does not apply route See admin instructions. For use with continuous glucose monitoring system; E11.9 (Patient not taking: Reported on 02/18/2019) 1 each 2   No current facility-administered medications on file prior to visit.    Allergies  Allergen Reactions  . Influenza Virus Vaccine Split Nausea And Vomiting  . Hydrochlorothiazide Other (See Comments)    Dizziness  . Lisinopril Cough  . Losartan Other (See Comments)    Dizziness    Family History  Problem Relation Age of Onset  . Other Mother   . Cancer Mother        Breast / Bone  . Heart attack Father   . Hypertension Father   . Hyperlipidemia Father   . Diabetes Other   . Alcohol abuse Sister   . Diabetes Maternal Grandfather   . Stroke Paternal Grandmother   . Alcohol abuse Paternal Grandfather     There were no vitals taken for this visit.    Review of Systems He denies hypoglycemia    Objective:   Physical Exam    Lab Results  Component Value Date   HGBA1C 7.1 (A) 02/18/2019       Assessment & Plan:  Type 1 DM, with PAD: The pattern of his cbg's  indicates he needs some adjustment in his therapy.   Hypoglycemia: this limits aggressiveness of glycemic control.   Renal insuff: This limits rx options   Patient Instructions  Please take these pump settings:  basal rate of 1.1 units/hr, 24 HRS per day (when not in auto mode).   mealtime bolus of 1 unit/10 grams carbohydrate.  correction bolus (which some people call "sensitivity," or "insulin sensitivity ratio," or just "isr") of 1 unit for each 50 by which your glucose exceeds 120.  Please come back for a follow-up appointment in 1 month.  check your blood sugar 4 times a day: before the 3 meals, and at bedtime.  also check if you have symptoms of your blood sugar being too high or too low.  please keep a record of the readings and bring it to your next appointment here (or you can bring the meter itself).  You can write it on any piece of paper.  please call us sooner if your blood sugar goes below 70, or if you have a lot of readings over 200.

## 2019-02-19 ENCOUNTER — Telehealth: Payer: Self-pay

## 2019-02-19 DIAGNOSIS — L97909 Non-pressure chronic ulcer of unspecified part of unspecified lower leg with unspecified severity: Secondary | ICD-10-CM | POA: Diagnosis not present

## 2019-02-19 DIAGNOSIS — Z89612 Acquired absence of left leg above knee: Secondary | ICD-10-CM | POA: Diagnosis not present

## 2019-02-19 DIAGNOSIS — L97929 Non-pressure chronic ulcer of unspecified part of left lower leg with unspecified severity: Secondary | ICD-10-CM | POA: Diagnosis not present

## 2019-02-19 NOTE — Telephone Encounter (Signed)
APPROVAL  Received notification from [employer] [place] that [place] for Humalog Stephanie Coup U200 has been approved. Document has been labeled and placed in scan file for HIM and for our future reference.

## 2019-02-19 NOTE — Telephone Encounter (Signed)
[employer] calling to make [place] aware an approval for Humalog Stephanie Coup is being faxed for this patient-FYI

## 2019-02-19 NOTE — Telephone Encounter (Signed)
Approval has been documented in a previously created encounter. Closing as duplicate.

## 2019-02-20 ENCOUNTER — Telehealth: Payer: Self-pay | Admitting: Nutrition

## 2019-02-20 ENCOUNTER — Encounter: Payer: BC Managed Care – PPO | Attending: Endocrinology | Admitting: Nutrition

## 2019-02-20 ENCOUNTER — Other Ambulatory Visit: Payer: Self-pay

## 2019-02-20 ENCOUNTER — Ambulatory Visit: Payer: BC Managed Care – PPO | Admitting: Neurology

## 2019-02-20 DIAGNOSIS — Z794 Long term (current) use of insulin: Secondary | ICD-10-CM | POA: Diagnosis not present

## 2019-02-20 DIAGNOSIS — E1065 Type 1 diabetes mellitus with hyperglycemia: Secondary | ICD-10-CM

## 2019-02-20 DIAGNOSIS — E119 Type 2 diabetes mellitus without complications: Secondary | ICD-10-CM | POA: Insufficient documentation

## 2019-02-20 DIAGNOSIS — E10610 Type 1 diabetes mellitus with diabetic neuropathic arthropathy: Secondary | ICD-10-CM | POA: Diagnosis not present

## 2019-02-20 NOTE — Telephone Encounter (Signed)
Patient"s wife was told that he is approved for the u-200 insulin, but that the pharmacy will not have it in stock.  It may take a few days. She was told to tell him that we must make changes to his pump settings before starting on this new insulin.  Please have him call me

## 2019-02-20 NOTE — Telephone Encounter (Signed)
please contact patient: Please send to other pharmacy of pt's choice

## 2019-02-20 NOTE — Telephone Encounter (Signed)
Basal rate: 1.25u/hr, I/C ratio: 8.5, ISF: 40, timing: 3 hours, target 120 with correction over 120.  Please take: Basal rate: 1.1 u/hr, I/C ratio: 7, ISF: 40, timing: 3 hours, target 120 with correction over 120.  Thank you

## 2019-02-20 NOTE — Telephone Encounter (Signed)
Patient is here with his U 200 Humalog.  His current pump settings are:  Basal rate: 1.25u/hr, I/C ratio: 8.5, ISF: 40, timing: 3 hours, target 120 with correction over 120.   Please let me know what you want the settings to be once he starts the U-200 humalog.

## 2019-02-20 NOTE — Telephone Encounter (Signed)
Please discuss with pt about an alternate pharmacy to send his Rx.

## 2019-02-21 NOTE — Telephone Encounter (Signed)
I believe this was intended to be sent to you 

## 2019-02-27 NOTE — Patient Instructions (Signed)
Call if questions. 

## 2019-02-27 NOTE — Progress Notes (Signed)
Pt. Is here to make changes to his PDM, because he wants a more powerful insulin, saying he is needing to change pods q 1.5 days.  He is here with Humalog U200 insulin.   Current settings in pump:  Basal rate: 1.1 u/hr, I/C ratio: 7, ISF: 40, timing: 3 hours, target 120 with correction over 120.  Changes made to PDM settings before starting U-200 Basal rate: 0.55, I/C ratio: 14, ISF: 80, timing 3 hours, target 120 with correction over 120.  We reviewed that this a twice a potent, and that he will be taking 1/2 as much for meal time insulin as well as correction doses and basal rates.  He reported good understanding of this. He filled a pod and was shown how to use a pen to fill this.  He reported good understanding.  He attached the pod to his left abdomen without difficulty and had no final questions.

## 2019-03-04 DIAGNOSIS — L97909 Non-pressure chronic ulcer of unspecified part of unspecified lower leg with unspecified severity: Secondary | ICD-10-CM | POA: Diagnosis not present

## 2019-03-04 DIAGNOSIS — L97929 Non-pressure chronic ulcer of unspecified part of left lower leg with unspecified severity: Secondary | ICD-10-CM | POA: Diagnosis not present

## 2019-03-04 DIAGNOSIS — Z89612 Acquired absence of left leg above knee: Secondary | ICD-10-CM | POA: Diagnosis not present

## 2019-03-25 NOTE — Progress Notes (Deleted)
NEUROLOGY FOLLOW UP OFFICE NOTE  BROADUS Hutchinson [id number]  HISTORY OF PRESENT ILLNESS: Michael Hutchinson is a 44 year old right-handed Caucasian man with type 1 diabetes with history of DKA, hypertension, Charcot's joint of foot, left below the knee amputation, hyperlipidemia and GERD who follows up for multiple sclerosis.   UPDATE: Current disease modifying therapy:Copaxone Other current medications:D3 50,000 IU twice weekly     Vision:Vision has cleared. Motor: Sometimes notes tremors in either hand, primarily the right.   Sensory: No numbness. Pain: Pain in legs and low back, right shoulder pain, increased joint stiffness. Gait: No change Bowel/Bladder: No issues. Fatigue: yes.  He easily sleeps when he sits down in his recliner.  He gets about 4 1/2 to 6 hours of sleep a night.  Has not been checked for sleep apnea.  His wife has told him he does not seem to snore or gasp for air in his sleep.   Cognition: no Mood: No depression  07/02/18 LABS:  Na 134, K 4.5, Cl 99, CO2 30, glucose 111, BUN 20, Cr 1.10; Hgb A1c 7.2.  HISTORY: In October 2016, he developed a right 6th cranial nerve palsy which was thought to be due to his uncontrolled type 1 diabetes.  In March 2017, he developed blurred vision in the left eye. He saw ophthalmologist Dr. Julian Reil who diagnosed optic neuritis. MRI of the brain and orbits with and without contrast from 05/11/2015 revealed "2 T2hyperintense lesions within the left hemisphere adjacent to the posterior horn of the left lateral ventricle ", a nonspecific finding and without abnormal contrast enhancement.  In March 2019, he developed disconjugate gaze and droopy left eyelid. He saw the eye doctor who diagnosed a left 3rd nerve palsy. He was evaluated in the ED at [facility] on 03/28/2017. CTA of the head was negative for aneurysm. MRI of the brain with and without contrast demonstrated multiple T2/flair  hyperintense foci involving the periventricular, deep and subcortical white matter, progressed compared to prior MRI from 2017. In-house neurology believed his findings were more likely due to a diabetic 3rd nerve palsy rather than MS. He was advised to start aspirin 81 mg daily and follow-up with outpatient neurology. He subsequently underwent a work-up for multiple sclerosis. MRI of the cervical spine with and without contrast from 05/04/2017 revealed no cord lesions. He underwent lumbar puncture on 05/19/2017 for CSF analysis which revealed elevated IgG index of 0.79 and 4 oligoclonal bands not present in the corresponding serum. Cell count was 1, elevated protein 157, elevated glucose 100, negative myelin basic protein, ACE 4, Gram stain and culture negative.  MRI of brain with and without contrast on 02/25/18 was personally reviewed and demonstrated no change since prior study from March 2019.  Past disease modifying therapy: Tecfidera (GI upset)  PAST MEDICAL HISTORY: Past Medical History:  Diagnosis Date  . Charcot's joint of foot 11/25/2013  . Complication of anesthesia    "I wake up angry" (12/31/2015)  . Diabetes mellitus type 1 ([place]) dx'd 1981  . Diabetic ketoacidosis ([place])   . Essential hypertension 05/03/2013  . GERD (gastroesophageal reflux disease)   . High cholesterol   . Hx MRSA infection    Inner thigh and under arm- healed areas  . Meniscus tear   . Shortness of breath    with exertion only    MEDICATIONS: Current Outpatient Medications on File Prior to Visit  Medication Sig Dispense Refill  . aspirin 81 MG chewable tablet Chew 1 tablet (  81 mg total) by mouth daily. 30 tablet 0  . baclofen (LIORESAL) 10 MG tablet Take 1 tablet (10 mg total) by mouth 3 (three) times daily as needed for muscle spasms. 90 each 3  . BAYER MICROLET LANCETS lancets Use as instructed to check 3 times daily. 100 each 12  . Blood Glucose Monitoring Suppl (ONE TOUCH ULTRA SYSTEM KIT) w/Device  KIT 1 kit by Does not apply route once. Check blood sugar 3 times daily. Diagnosis Diabetes ICD-10 E11.8    . Continuous Blood Gluc Receiver ([place]) U[place]in 1 each by Does not apply route See admin instructions. For use with continuous glucose monitoring system; E11.9 1 each 0  . Continuous Blood Gluc Sensor (DEXCOM G6 SENSOR) MISC APPLY 1 SENSOR AS DIRECTED AND REPLACE EVERY 10 DAYS. 3 each 2  . Continuous Blood Gluc Transmit (DEXCOM G6 TRANSMITTER) MISC 1 each by Does not apply route See admin instructions. For use with continuous glucose monitoring system; E11.9 1 each 2  . etodolac (LODINE) 400 MG tablet etodolac 400 mg tablet    . Glatiramer Acetate 40 MG/ML SOSY INJECT 40 MG UNDER THE SKIN (SUBCUTANEOUS INJECTION) ON MONDAY, WEDNESDAY, AND FRIDAY 480 mL 2  . glucagon (GLUCAGON EMERGENCY) 1 MG injection Inject 1 mg into the muscle once as needed. (Patient taking differently: Inject 1 mg into the muscle daily as needed (hypoglycemia). ) 1 each 11  . glucose blood (BAYER CONTOUR NEXT TEST) test strip 1 each by Other route 4 (four) times daily. And lancets 4/day 400 each 3  . HYDROcodone-acetaminophen (NORCO/VICODIN) 5-325 MG tablet hydrocodone 5 mg-acetaminophen 325 mg tablet    . ibuprofen (ADVIL,MOTRIN) 800 MG tablet Take 1 tablet (800 mg total) by mouth 3 (three) times daily. 21 tablet 0  . Insulin Lispro ([name]) 200 UNIT/ML SOPN Inject 90 Units into the skin daily. For use in pump, total of 90 units per day, and pen needles 2/day 10 pen 11  . Insulin Syringes, Disposable, U-100 1 ML MISC 3x daily 50 each 2  . losartan (COZAAR) 50 MG tablet 1 po qd 30 tablet 0  . omeprazole (PRILOSEC) 20 MG capsule Take 20 mg twice daily for 4 weeks then once daily 60 capsule 0  . Vitamin D, Ergocalciferol, (DRISDOL) 50000 units CAPS capsule 1 tab every Wednesday and Sunday 24 capsule 10   No current facility-administered medications on file prior to visit.    ALLERGIES: Allergies    Allergen Reactions  . Influenza Virus Vaccine Split Nausea And Vomiting  . Hydrochlorothiazide Other (See Comments)    Dizziness  . Lisinopril Cough  . Losartan Other (See Comments)    Dizziness    FAMILY HISTORY: Family History  Problem Relation Age of Onset  . Other Mother   . Cancer Mother        Breast / Bone  . Heart attack Father   . Hypertension Father   . Hyperlipidemia Father   . Diabetes Other   . Alcohol abuse Sister   . Diabetes Maternal Grandfather   . Stroke Paternal Grandmother   . Alcohol abuse Paternal Grandfather     SOCIAL HISTORY: Social History   Socioeconomic History  . Marital status: Married    Spouse name: Michael Hutchinson  . Number of children: 2  . Years of education: Not on file  . Highest education level: High school graduate  Occupational History  . Occupation: Dealer  Tobacco Use  . Smoking status: Never Smoker  . Smokeless tobacco:  Former Systems developer    Types: Snuff, Chew  Substance and Sexual Activity  . Alcohol use: Yes    Alcohol/week: 14.0 standard drinks    Types: 14 Cans of beer per week  . Drug use: No  . Sexual activity: Yes  Other Topics Concern  . Not on file  Social History Narrative   Pt is a right handed married man, lives with his wife and 2 children ages 64 and 17, in a single story house. Pt drinks 6-8, 16oz diet sodas a day.    Social Determinants of Health   Financial Resource Strain:   . Difficulty of Paying Living Expenses:   Food Insecurity:   . Worried About Charity fundraiser in the Last Year:   . Arboriculturist in the Last Year:   Transportation Needs:   . Film/video editor (Medical):   Marland Kitchen Lack of Transportation (Non-Medical):   Physical Activity:   . Days of Exercise per Week:   . Minutes of Exercise per Session:   Stress:   . Feeling of Stress :   Social Connections:   . Frequency of Communication with Friends and Family:   . Frequency of Social Gatherings with Friends and Family:   . Attends  Religious Services:   . Active Member of Clubs or Organizations:   . Attends Archivist Meetings:   Marland Kitchen Marital Status:   Intimate Partner Violence:   . Fear of Current or Ex-Partner:   . Emotionally Abused:   Marland Kitchen Physically Abused:   . Sexually Abused:     REVIEW OF SYSTEMS: Constitutional: No fevers, chills, or sweats, no generalized fatigue, change in appetite Eyes: No visual changes, double vision, eye pain Ear, nose and throat: No hearing loss, ear pain, nasal congestion, sore throat Cardiovascular: No chest pain, palpitations Respiratory:  No shortness of breath at rest or with exertion, wheezes GastrointestinaI: No nausea, vomiting, diarrhea, abdominal pain, fecal incontinence Genitourinary:  No dysuria, urinary retention or frequency Musculoskeletal:  No neck pain, back pain Integumentary: No rash, pruritus, skin lesions Neurological: as above Psychiatric: No depression, insomnia, anxiety Endocrine: No palpitations, fatigue, diaphoresis, mood swings, change in appetite, change in weight, increased thirst Hematologic/Lymphatic:  No purpura, petechiae. Allergic/Immunologic: no itchy/runny eyes, nasal congestion, recent allergic reactions, rashes  PHYSICAL EXAM: *** General: No acute distress.  Patient appears ***-groomed.   Head:  Normocephalic/atraumatic Eyes:  Fundi examined but not visualized Neck: supple, no paraspinal tenderness, full range of motion Heart:  Regular rate and rhythm Lungs:  Clear to auscultation bilaterally Back: No paraspinal tenderness Neurological Exam: alert and oriented to person, place, and time. Attention span and concentration intact, recent and remote memory intact, fund of knowledge intact.  Speech fluent and not dysarthric, language intact.  CN II-XII intact. Bulk and tone normal, muscle strength 5/5 throughout.  Sensation to light touch, temperature and vibration intact.  Deep tendon reflexes 2+ throughout, toes downgoing.  Finger to  nose and heel to shin testing intact.  Gait normal, Romberg negative.  IMPRESSION: ***  PLAN: ***  Michael Clines, DO  CC: ***

## 2019-03-26 ENCOUNTER — Other Ambulatory Visit: Payer: Self-pay

## 2019-03-26 ENCOUNTER — Ambulatory Visit: Payer: BC Managed Care – PPO | Admitting: Neurology

## 2019-03-26 ENCOUNTER — Other Ambulatory Visit: Payer: Self-pay | Admitting: Endocrinology

## 2019-03-26 DIAGNOSIS — E1051 Type 1 diabetes mellitus with diabetic peripheral angiopathy without gangrene: Secondary | ICD-10-CM

## 2019-03-26 DIAGNOSIS — E1065 Type 1 diabetes mellitus with hyperglycemia: Secondary | ICD-10-CM

## 2019-03-26 MED ORDER — OMNIPOD DASH PODS (GEN 4) MISC: 1.0000 | 2 refills | Status: DC

## 2019-03-27 ENCOUNTER — Other Ambulatory Visit: Payer: Self-pay | Admitting: Endocrinology

## 2019-03-27 DIAGNOSIS — E1051 Type 1 diabetes mellitus with diabetic peripheral angiopathy without gangrene: Secondary | ICD-10-CM

## 2019-03-28 DIAGNOSIS — E109 Type 1 diabetes mellitus without complications: Secondary | ICD-10-CM | POA: Diagnosis not present

## 2019-03-28 DIAGNOSIS — Z794 Long term (current) use of insulin: Secondary | ICD-10-CM | POA: Diagnosis not present

## 2019-03-29 DIAGNOSIS — Z89512 Acquired absence of left leg below knee: Secondary | ICD-10-CM | POA: Diagnosis not present

## 2019-04-14 ENCOUNTER — Other Ambulatory Visit: Payer: Self-pay | Admitting: Neurology

## 2019-04-14 DIAGNOSIS — Z79899 Other long term (current) drug therapy: Secondary | ICD-10-CM

## 2019-04-14 DIAGNOSIS — R9082 White matter disease, unspecified: Secondary | ICD-10-CM

## 2019-04-14 DIAGNOSIS — G35 Multiple sclerosis: Secondary | ICD-10-CM

## 2019-04-16 ENCOUNTER — Other Ambulatory Visit: Payer: Self-pay

## 2019-04-16 DIAGNOSIS — Z79899 Other long term (current) drug therapy: Secondary | ICD-10-CM

## 2019-04-16 DIAGNOSIS — G35 Multiple sclerosis: Secondary | ICD-10-CM

## 2019-04-16 DIAGNOSIS — R9082 White matter disease, unspecified: Secondary | ICD-10-CM

## 2019-04-16 MED ORDER — GLATIRAMER ACETATE 40 MG/ML ~~LOC~~ SOSY: PREFILLED_SYRINGE | SUBCUTANEOUS | 0 refills | Status: DC

## 2019-04-18 ENCOUNTER — Telehealth: Payer: Self-pay | Admitting: Neurology

## 2019-04-18 DIAGNOSIS — R9082 White matter disease, unspecified: Secondary | ICD-10-CM

## 2019-04-18 DIAGNOSIS — Z79899 Other long term (current) drug therapy: Secondary | ICD-10-CM

## 2019-04-18 DIAGNOSIS — G35 Multiple sclerosis: Secondary | ICD-10-CM

## 2019-04-18 MED ORDER — GLATIRAMER ACETATE 40 MG/ML ~~LOC~~ SOSY: PREFILLED_SYRINGE | SUBCUTANEOUS | 0 refills | Status: DC

## 2019-04-18 NOTE — Telephone Encounter (Signed)
Glatiramer rx updated.

## 2019-04-18 NOTE — Telephone Encounter (Signed)
Ayana from [facility] called and left a message requesting a new prescription for glatiramer 40 MG. She said the prescription was sent in incorrectly with a quantity of 3. Please resend a corrected prescription.  Fax: [phone]

## 2019-05-09 DIAGNOSIS — M7711 Lateral epicondylitis, right elbow: Secondary | ICD-10-CM | POA: Diagnosis not present

## 2019-05-09 DIAGNOSIS — M25511 Pain in right shoulder: Secondary | ICD-10-CM | POA: Diagnosis not present

## 2019-05-09 DIAGNOSIS — M25521 Pain in right elbow: Secondary | ICD-10-CM | POA: Diagnosis not present

## 2019-05-18 DIAGNOSIS — M25521 Pain in right elbow: Secondary | ICD-10-CM | POA: Diagnosis not present

## 2019-05-18 DIAGNOSIS — M25511 Pain in right shoulder: Secondary | ICD-10-CM | POA: Diagnosis not present

## 2019-05-20 ENCOUNTER — Other Ambulatory Visit: Payer: Self-pay

## 2019-05-20 ENCOUNTER — Other Ambulatory Visit: Payer: Self-pay | Admitting: Endocrinology

## 2019-05-20 ENCOUNTER — Telehealth: Payer: Self-pay | Admitting: Endocrinology

## 2019-05-20 DIAGNOSIS — E1065 Type 1 diabetes mellitus with hyperglycemia: Secondary | ICD-10-CM

## 2019-05-20 MED ORDER — DEXCOM G6 SENSOR MISC: 0 refills | Status: DC

## 2019-05-20 NOTE — Telephone Encounter (Signed)
Pt scheduled for in office appt for 05/28/19. Added he will not consent to a foot exam but provide no further explanation other than that he will be here 05/28/19, then disconnected call.  Outpatient Medication Detail   Disp Refills Start End   Continuous Blood Gluc Sensor (DEXCOM G6 SENSOR) MISC 3 each 0 05/20/2019    Sig: APPLY 1 SENSOR AS DIRECTED AND REPLACE EVERY 10 DAYS.   Sent to pharmacy as: Continuous Blood Gluc Sensor (DEXCOM G6 SENSOR) Misc   E-Prescribing Status: Receipt confirmed by pharmacy (05/20/2019  1:24 PM EDT)

## 2019-05-20 NOTE — Telephone Encounter (Signed)
Patient requests a call to explain the need for a follow up when he was just seen in February. [phone]  Medication Refill Request  Did you call your pharmacy and request this refill first? Yes  . If patient has not contacted pharmacy first, instruct them to do so for future refills.  . Remind them that contacting the pharmacy for their refill is the quickest method to get the refill.  . Refill policy also stated that it will take anywhere between 24-72 hours to receive the refill.    Name of medication? Dexcom sensors  Is this a 90 day supply?   Name and location of pharmacy?  [facility] - [place], [place] - [address] Phone:  [phone]  Fax:  [phone]

## 2019-05-22 ENCOUNTER — Telehealth: Payer: Self-pay | Admitting: Endocrinology

## 2019-05-22 ENCOUNTER — Other Ambulatory Visit: Payer: Self-pay

## 2019-05-22 DIAGNOSIS — E1065 Type 1 diabetes mellitus with hyperglycemia: Secondary | ICD-10-CM

## 2019-05-22 MED ORDER — HUMALOG KWIKPEN 200 UNIT/ML ~~LOC~~ SOPN: 90.0000 [IU] | PEN_INJECTOR | Freq: Every day | SUBCUTANEOUS | 0 refills | Status: DC

## 2019-05-22 NOTE — Telephone Encounter (Signed)
Pt already has an appt next week. Since he is having issues and needs more insulin, do you want to appt to be moved up?

## 2019-05-22 NOTE — Telephone Encounter (Signed)
please refill x 1, pending that appt.  Thank you

## 2019-05-22 NOTE — Telephone Encounter (Signed)
Medication Refill Request  Did you call your pharmacy and request this refill first? Yes   If patient has not contacted pharmacy first, instruct them to do so for future refills.   Remind them that contacting the pharmacy for their refill is the quickest method to get the refill.   Refill policy also stated that it will take anywhere between 24-72 hours to receive the refill.    Name of medication? humalog 200 insulin  Is this a 90 day supply? No 30  Name and location of pharmacy?  [facility] - [place], [place] - [address] Phone:  [phone]  Fax:  [phone]     --- patient not due for a refill until may 18 but he said hes been having issues with the omnipod and has had to use more up due to the issues.

## 2019-05-22 NOTE — Telephone Encounter (Signed)
Patient is already scheduled for a follow up on 05/30/19.

## 2019-05-22 NOTE — Telephone Encounter (Signed)
Outpatient Medication Detail   Disp Refills Start End   insulin lispro ([name]) 200 UNIT/ML KwikPen 27 mL 0 05/22/2019    Sig - Route: Inject 90 Units into the skin daily. For use in pump, total of 90 units per day; Will provide 30 day supply. Adjustments will be made at next office visit - Subcutaneous   Sent to pharmacy as: insulin lispro ([name]) 200 UNIT/ML KwikPen   E-Prescribing Status: Receipt confirmed by pharmacy (05/22/2019 11:50 AM EDT)

## 2019-05-22 NOTE — Telephone Encounter (Signed)
Please refer to pt message below and advise if you would prefer to move up appt:  - patient not due for a refill until may 18 but he said hes been having issues with the omnipod and has had to use more up due to the issues.   Next Appt With Internal Medicine Romero Belling, MD) 05/30/2019 at 8:45 AM

## 2019-05-22 NOTE — Telephone Encounter (Signed)
1.  Please schedule f/u appt 2.  Then please refill x 1, pending that appt.  

## 2019-05-23 DIAGNOSIS — S43431D Superior glenoid labrum lesion of right shoulder, subsequent encounter: Secondary | ICD-10-CM | POA: Diagnosis not present

## 2019-05-23 DIAGNOSIS — M19011 Primary osteoarthritis, right shoulder: Secondary | ICD-10-CM | POA: Diagnosis not present

## 2019-05-23 DIAGNOSIS — M25511 Pain in right shoulder: Secondary | ICD-10-CM | POA: Diagnosis not present

## 2019-05-23 NOTE — Telephone Encounter (Signed)
Patients insurance will not allow him to get the refill for the Humalog until the 18th - patient had a pump malfunction and does not have insulin to last until the 18th. Pharmacy calling requesting if we could re send the RX for "use 90 up to 120 units per day" so that he can possibly get a refill before the 18th. Unless we are able to give him a sample until the 18th. Please advise.  Pharmacy # [id number] Judeth Cornfield)

## 2019-05-24 ENCOUNTER — Other Ambulatory Visit: Payer: Self-pay

## 2019-05-24 DIAGNOSIS — E1065 Type 1 diabetes mellitus with hyperglycemia: Secondary | ICD-10-CM

## 2019-05-24 MED ORDER — HUMALOG KWIKPEN 200 UNIT/ML ~~LOC~~ SOPN: 100.0000 [IU] | PEN_INJECTOR | Freq: Every day | SUBCUTANEOUS | 0 refills | Status: DC

## 2019-05-24 NOTE — Telephone Encounter (Signed)
Due to pt safety, best I can do is 100 units/d 1.  Please schedule f/u appt 2.  Then please refill x 1, pending that appt.

## 2019-05-24 NOTE — Telephone Encounter (Signed)
Outpatient Medication Detail   Disp Refills Start End   insulin lispro ([name]) 200 UNIT/ML KwikPen 30 mL 0 05/24/2019    Sig - Route: Inject 100 Units into the skin daily. For use in pump; Will provide 30 day supply. - Subcutaneous   Sent to pharmacy as: insulin lispro ([name]) 200 UNIT/ML KwikPen   E-Prescribing Status: Receipt confirmed by pharmacy (05/24/2019  1:23 PM EDT)

## 2019-05-24 NOTE — Addendum Note (Signed)
Addended by: Romero Belling on: 05/24/2019 12:23 PM   Modules accepted: Orders

## 2019-05-24 NOTE — Telephone Encounter (Signed)
Please advise 

## 2019-05-27 DIAGNOSIS — M25521 Pain in right elbow: Secondary | ICD-10-CM | POA: Diagnosis not present

## 2019-05-27 DIAGNOSIS — M7711 Lateral epicondylitis, right elbow: Secondary | ICD-10-CM | POA: Diagnosis not present

## 2019-05-28 ENCOUNTER — Ambulatory Visit: Payer: BC Managed Care – PPO | Admitting: Endocrinology

## 2019-05-28 ENCOUNTER — Other Ambulatory Visit: Payer: Self-pay

## 2019-05-30 ENCOUNTER — Ambulatory Visit: Payer: BC Managed Care – PPO | Admitting: Endocrinology

## 2019-05-31 ENCOUNTER — Telehealth: Payer: Self-pay | Admitting: Endocrinology

## 2019-05-31 NOTE — Telephone Encounter (Signed)
Take 1 extra unit for each 50 by which your glucose exceeds 120. Please move up next appt to next available

## 2019-05-31 NOTE — Telephone Encounter (Signed)
Patients wife called and is requesting a call back.  They are trying to figure out normal daily insulin dosing and emergency dosing  Patient experiencing 300 plus blood sugar daily and they need assistance with management  Call back number [phone]

## 2019-05-31 NOTE — Telephone Encounter (Signed)
Returned wife's call. Informed about new orders written below. Verbalized acceptance and understanding. Also rescheduled appt as follows:  Next Appt With Internal Medicine Romero Belling, MD) 06/04/2019 at 10:00 AM  States she will need to call pt to make sure he can attend this appt. If not, states she will call to reschedule.

## 2019-05-31 NOTE — Telephone Encounter (Signed)
Please advise 

## 2019-06-04 ENCOUNTER — Ambulatory Visit: Payer: BC Managed Care – PPO | Admitting: Endocrinology

## 2019-06-05 ENCOUNTER — Encounter: Payer: Self-pay | Admitting: Physician Assistant

## 2019-06-05 ENCOUNTER — Ambulatory Visit (INDEPENDENT_AMBULATORY_CARE_PROVIDER_SITE_OTHER): Payer: BC Managed Care – PPO | Admitting: Physician Assistant

## 2019-06-05 ENCOUNTER — Other Ambulatory Visit: Payer: Self-pay

## 2019-06-05 VITALS — BP 162/95 | HR 124 | Temp 98.6°F | Ht 72.0 in | Wt 373.5 lb

## 2019-06-05 DIAGNOSIS — E1065 Type 1 diabetes mellitus with hyperglycemia: Secondary | ICD-10-CM

## 2019-06-05 DIAGNOSIS — IMO0002 Reserved for concepts with insufficient information to code with codable children: Secondary | ICD-10-CM

## 2019-06-05 DIAGNOSIS — E1159 Type 2 diabetes mellitus with other circulatory complications: Secondary | ICD-10-CM

## 2019-06-05 DIAGNOSIS — I10 Essential (primary) hypertension: Secondary | ICD-10-CM

## 2019-06-05 DIAGNOSIS — I152 Hypertension secondary to endocrine disorders: Secondary | ICD-10-CM

## 2019-06-05 DIAGNOSIS — E559 Vitamin D deficiency, unspecified: Secondary | ICD-10-CM

## 2019-06-05 DIAGNOSIS — E10610 Type 1 diabetes mellitus with diabetic neuropathic arthropathy: Secondary | ICD-10-CM

## 2019-06-05 DIAGNOSIS — K219 Gastro-esophageal reflux disease without esophagitis: Secondary | ICD-10-CM | POA: Diagnosis not present

## 2019-06-05 LAB — POCT UA - MICROALBUMIN
Albumin/Creatinine Ratio, Urine, POC: >300
Creatinine, POC: 50 mg/dL
Microalbumin Ur, POC: 80 mg/L

## 2019-06-05 MED ORDER — DEXCOM G6 SENSOR MISC: 0 refills | Status: DC

## 2019-06-05 MED ORDER — OMEPRAZOLE 20 MG PO CPDR: 20.0000 mg | DELAYED_RELEASE_CAPSULE | Freq: Every day | ORAL | 1 refills | Status: DC

## 2019-06-05 MED ORDER — VITAMIN D (ERGOCALCIFEROL) 1.25 MG (50000 UNIT) PO CAPS: ORAL_CAPSULE | ORAL | 6 refills | Status: DC

## 2019-06-05 MED ORDER — LOSARTAN POTASSIUM 50 MG PO TABS: ORAL_TABLET | ORAL | 1 refills | Status: DC

## 2019-06-05 NOTE — Progress Notes (Signed)
Telehealth office visit note for Michael Reid, PA-C- at Primary Care at [facility]   I connected with current patient today by telephone and verified that I am speaking with the correct person   . Location of the patient: Home . Location of the provider: Office - This visit type was conducted due to national recommendations for restrictions regarding the COVID-19 Pandemic (e.g. social distancing) in an effort to limit this patient's exposure and mitigate transmission in our community.    - No physical exam could be performed with this format, beyond that communicated to [place] by the patient/ family members as noted.   - Additionally my office staff/ schedulers were to discuss with the patient that there may be a monetary charge related to this service, depending on their medical insurance.  My understanding is that patient understood and consented to proceed.     _________________________________________________________________________________   History of Present Illness: Pt calls in to discuss type 1 diabetes management which is currently managed by endocrinology- Dr. Loanne Drilling. Pt discussed at length his dissatisfaction with current medication therapy and inability to lower his A1c below 7 despite making efforts such as reducing carbohydrates and glucose. His FBS range 150-180. Reports his experience with Dr. Loanne Drilling is not the same as in the past and would like for his PCP to take over management of his diabetes.   HTN: Pt denies chest pain, palpitations, or dizziness. Taking medication as directed without side effects. Requests refill of Losartan.   GERD: Requests refill of Omeprazole.    GAD 7 : Generalized Anxiety Score 06/05/2018  Nervous, Anxious, on Edge 0  Control/stop worrying 0  Worry too much - different things 0  Trouble relaxing 0  Restless 0  Easily annoyed or irritable 0  Afraid - awful might happen 0  Total GAD 7 Score 0    Depression screen [facility] 2/9 06/05/2018  11/22/2017 10/09/2017 08/08/2017 07/11/2017  Decreased Interest 0 1 1 0 0  Down, Depressed, Hopeless 0 0 0 0 0  PHQ - 2 Score 0 1 1 0 0  Altered sleeping 2 0 0 0 0  Tired, decreased energy 0 '1 1 2 2  '$ Change in appetite 0 0 0 1 3  Feeling bad or failure about yourself  0 0 0 0 0  Trouble concentrating 0 1 0 0 0  Moving slowly or fidgety/restless 0 0 0 0 0  Suicidal thoughts 0 0 0 0 0  PHQ-9 Score '2 3 2 3 5  '$ Difficult doing work/chores - - Not difficult at all Not difficult at all Not difficult at all      Impression and Recommendations:     1. Type 1 diabetes, uncontrolled, with Charcot's joint of foot ([place])   2. Hypertension associated with diabetes ([place])   3. Vitamin D deficiency   4. Gastroesophageal reflux disease, unspecified whether esophagitis present   5. Type 1 diabetes mellitus with hyperglycemia (HCC)     Type 1 Diabetes: - Last A1c 7.1, stable. - Discussed with patient management of type 1 diabetes requires specialty care from endocrinology and I can place a referral to different endocrinology office. Pt verbalized understanding and agreeable.  - UA microalbumin: high abnormal  - Continue ambulatory glucose monitoring.  - Encourage to continue low glucose/carbohydrate diet and stay as active as possible.  HTN: - BP today is 162/95, above goal. - Continue Losartan 50 mg. Refills provided. - Encourage ambulatory BP and pulse monitoring. If BP continues to remain above goal (<  130/80) then will consider increasing med dose.  - Follow DASH diet. - Will continue to monitor.   Vitamin D deficiency: - Pt is due for vitamin D level recheck.  - Plan to recheck at next [place].  GERD: - stable, provided refill of Omeprazole.    - As part of my medical decision making, I reviewed the following data within the [place] History obtained from pt /family, CMA notes reviewed and incorporated if applicable, Labs reviewed, Radiograph/ tests reviewed if applicable and  OV notes from prior OV's with me, as well as any other specialists she/he has seen since seeing me last, were all reviewed and used in my medical decision making process today.    - Additionally, when appropriate, discussion had with patient regarding our treatment plan, and their biases/concerns about that plan were used in my medical decision making today.    - The patient agreed with the plan and demonstrated an understanding of the instructions.   No barriers to understanding were identified.     - The patient was advised to call back or seek an in-person evaluation if the symptoms worsen or if the condition fails to improve as anticipated.   Return in about 3 months (around 09/05/2019) for HTN, HLD, DM, GERD and FBW (lipid, cmp, a1c, cbc).    Orders Placed This Encounter  Procedures  . Ambulatory referral to Endocrinology  . POCT UA - Microalbumin    Meds ordered this encounter  Medications  . losartan (COZAAR) 50 MG tablet    Sig: 1 po qd    Dispense:  90 tablet    Refill:  1  . Vitamin D, Ergocalciferol, (DRISDOL) 1.25 MG (50000 UNIT) CAPS capsule    Sig: 1 tab every Wednesday and Sunday    Dispense:  24 capsule    Refill:  6  . omeprazole (PRILOSEC) 20 MG capsule    Sig: Take 1 capsule (20 mg total) by mouth daily.    Dispense:  90 capsule    Refill:  1  . Continuous Blood Gluc Sensor (DEXCOM G6 SENSOR) MISC    Sig: APPLY 1 SENSOR AS DIRECTED AND REPLACE EVERY 10 DAYS.    Dispense:  3 each    Refill:  0    Order Specific Question:   Supervising Provider    Answer:   Beatrice Lecher D [[id number]]    Medications Discontinued During This Encounter  Medication Reason  . baclofen (LIORESAL) 10 MG tablet   . etodolac (LODINE) 400 MG tablet   . glucagon (GLUCAGON EMERGENCY) 1 MG injection   . Vitamin D, Ergocalciferol, (DRISDOL) 50000 units CAPS capsule Reorder  . omeprazole (PRILOSEC) 20 MG capsule Reorder  . losartan (COZAAR) 50 MG tablet Reorder  . Continuous Blood  Gluc Sensor (DEXCOM G6 SENSOR) MISC Reorder       Time spent on visit including pre-visit chart review and post-visit care was 18 minutes.      The [place] was signed into law in 2016 which includes the topic of electronic health records.  This provides immediate access to information in MyChart.  This includes consultation notes, operative notes, office notes, lab results and pathology reports.  If you have any questions about what you read please let us know at your next visit or call us at the office.  We are right here with you.   __________________________________________________________________________________     Patient Care Team    Relationship Specialty Notifications Start End  Michael Reid, PA-C PCP - General   05/12/19   Fayette Pho, PA Attending Physician Orthopedic Surgery  06/01/16   [name], MD Consulting Physician Orthopedic Surgery  07/04/16   Campbell Riches, MD Consulting Physician Infectious Diseases  07/04/16   Pieter Partridge, DO Consulting Physician Neurology  05/29/17   Renato Shin, MD Consulting Physician Endocrinology  05/29/17    Comment: Treatment of diabetes  Philemon Kingdom, MD Consulting Physician Internal Medicine  07/11/17    Comment: Endocrinologist     -Vitals obtained; medications/ allergies reconciled;  personal medical, social, Sx etc.histories were updated by CMA, reviewed by me and are reflected in chart   Patient Active Problem List   Diagnosis Date Noted  . Tendinosis of right shoulder 04/09/2018  . Pain in joint of right shoulder 02/14/2018  . Burning in the chest 09/20/2017  . Statin intolerance 08/08/2017  . Multiple sclerosis ([place]) 07/11/2017  . Low hemoglobin 07/11/2017  . Lateral epicondylitis of right elbow 03/20/2017  . Pain in joint of right elbow 03/20/2017  . Chronic fatigue 07/04/2016  . Impaired exercise tolerance 07/04/2016  . Acute left otitis media 06/09/2016  . Hx of BKA, left - with  subsequent cellulitis and treatment for osteomylitis 06/01/2016  . Abscess of left leg 04/26/2016  . Diabetes ([place])   . Normocytic anemia 01/01/2016  . Osteomyelitis ([place]) 12/31/2015  . Diabetic ulcer of left heel associated with type 1 diabetes mellitus, with necrosis of bone ([place]) 12/31/2015  . Ankle wound 12/31/2015  . Chronic osteomyelitis of left ankle with draining sinus ([place])   . Type 1 diabetes, uncontrolled, with Charcot's joint of foot ([place]) 10/12/2015  . Low HDL (under 40) 09/22/2015  . Obesity, Class III, BMI 40-49.9 (morbid obesity) ([place]) 09/22/2015  . H/O noncompliance with medical treatment, presenting hazards to health 09/22/2015  . Hx MRSA infection 09/22/2015  . Vitamin D deficiency 09/22/2015  . Charcot's joint of foot 11/25/2013  . Hypertension associated with diabetes ([place]) 05/03/2013  . Mixed diabetic hyperlipidemia associated with type 1 diabetes mellitus ([place]) 07/26/2012  . Diabetic ketoacidosis ([place])   . GERD (gastroesophageal reflux disease)   . Meniscus tear   . Epigastric pain 12/10/2011  . Vomiting 12/10/2011  . Problems influencing health status 11/21/2011     Current Meds  Medication Sig  . aspirin 81 MG chewable tablet Chew 1 tablet (81 mg total) by mouth daily.  Marland Kitchen BAYER MICROLET LANCETS lancets Use as instructed to check 3 times daily.  . Blood Glucose Monitoring Suppl (ONE TOUCH ULTRA SYSTEM KIT) w/Device KIT 1 kit by Does not apply route once. Check blood sugar 3 times daily. Diagnosis Diabetes ICD-10 E11.8  . Continuous Blood Gluc Receiver ([place]) [place] 1 each by Does not apply route See admin instructions. For use with continuous glucose monitoring system; E11.9  . Continuous Blood Gluc Sensor (DEXCOM G6 SENSOR) MISC APPLY 1 SENSOR AS DIRECTED AND REPLACE EVERY 10 DAYS.  . Continuous Blood Gluc Transmit (DEXCOM G6 TRANSMITTER) MISC FOR USE WITH CONTINUOUS GLUCOSE MONITORING SYSTEM  . Glatiramer Acetate 40 MG/ML SOSY INJECT 40 MG (ONE  SYRINGE) UNDER THE SKIN (SUBCUTANEOUS INJECTION) ON MONDAY WEDNESDAY AND FRIDAY  . glucose blood (BAYER CONTOUR NEXT TEST) test strip 1 each by Other route 4 (four) times daily. And lancets 4/day  . HYDROcodone-acetaminophen (NORCO/VICODIN) 5-325 MG tablet hydrocodone 5 mg-acetaminophen 325 mg tablet  . ibuprofen (ADVIL,MOTRIN) 800 MG tablet Take 1 tablet (800 mg total) by mouth 3 (three) times  daily.  . Insulin Disposable Pump (OMNIPOD DASH 5 PACK PODS) MISC 1 each by Does not apply route every 3 (three) days. E10.69  . insulin lispro ([name]) 200 UNIT/ML KwikPen Inject 100 Units into the skin daily. For use in pump; Will provide 30 day supply. (Patient taking differently: Inject 100 Units into the skin daily. For use in pump; sliding scale)  . Insulin Syringes, Disposable, U-100 1 ML MISC 3x daily  . losartan (COZAAR) 50 MG tablet 1 po qd  . omeprazole (PRILOSEC) 20 MG capsule Take 1 capsule (20 mg total) by mouth daily.  . Vitamin D, Ergocalciferol, (DRISDOL) 1.25 MG (50000 UNIT) CAPS capsule 1 tab every Wednesday and Sunday  . [DISCONTINUED] Continuous Blood Gluc Sensor (DEXCOM G6 SENSOR) MISC APPLY 1 SENSOR AS DIRECTED AND REPLACE EVERY 10 DAYS.  . [DISCONTINUED] losartan (COZAAR) 50 MG tablet 1 po qd  . [DISCONTINUED] omeprazole (PRILOSEC) 20 MG capsule Take 20 mg twice daily for 4 weeks then once daily  . [DISCONTINUED] Vitamin D, Ergocalciferol, (DRISDOL) 50000 units CAPS capsule 1 tab every Wednesday and Sunday     Allergies:  Allergies  Allergen Reactions  . Influenza Virus Vaccine Split Nausea And Vomiting  . Hydrochlorothiazide Other (See Comments)    Dizziness  . Lisinopril Cough  . Losartan Other (See Comments)    Dizziness     ROS:  See above HPI for pertinent positives and negatives   Objective:   Blood pressure (!) 162/95, pulse (!) 124, temperature 98.6 F (37 C), temperature source Oral, height 6' (1.829 m), weight (!) 373 lb 8 oz (169.4 kg), SpO2 98 %.   (if some vitals are omitted, this means that patient was UNABLE to obtain them even though they were asked to get them prior to OV today.  They were asked to call us at their earliest convenience with these once obtained. ) General: A & O * 3; sounds in no acute distress; in usual state of health.  Respiratory: speaking in full sentences, no conversational dyspnea; patient confirms no use of accessory muscles Psych: insight appears good, mood- appears full

## 2019-06-12 ENCOUNTER — Ambulatory Visit: Payer: BC Managed Care – PPO | Admitting: Endocrinology

## 2019-06-20 DIAGNOSIS — L97909 Non-pressure chronic ulcer of unspecified part of unspecified lower leg with unspecified severity: Secondary | ICD-10-CM | POA: Diagnosis not present

## 2019-06-20 DIAGNOSIS — Z89612 Acquired absence of left leg above knee: Secondary | ICD-10-CM | POA: Diagnosis not present

## 2019-06-20 DIAGNOSIS — L97929 Non-pressure chronic ulcer of unspecified part of left lower leg with unspecified severity: Secondary | ICD-10-CM | POA: Diagnosis not present

## 2019-06-25 NOTE — H&P (Signed)
Patient's anticipated LOS is less than 2 midnights, meeting these requirements: - Younger than 22 - Lives within 1 hour of care - Has a competent adult at home to recover with post-op recover - NO history of  - Chronic pain requiring opiods  - Diabetes  - Coronary Artery Disease  - Heart failure  - Heart attack  - Stroke  - DVT/VTE  - Cardiac arrhythmia  - Respiratory Failure/COPD  - Renal failure  - Anemia  - Advanced Liver disease       Michael Hutchinson is an 44 y.o. male.    Chief Complaint: right shoulder pain  HPI: Pt is a 44 y.o. male complaining of right shoulder pain for multiple months. Pain had continually increased since the beginning. X-rays in the clinic show [facility] arthrosis and SLAP tear. Pt has tried various conservative treatments which have failed to alleviate their symptoms, including injections and therapy. Various options are discussed with the patient. Risks, benefits and expectations were discussed with the patient. Patient understand the risks, benefits and expectations and wishes to proceed with surgery.   PCP:  Lorrene Reid, PA-C  D/C Plans: Home  PMH: Past Medical History:  Diagnosis Date  . Charcot's joint of foot 11/25/2013  . Complication of anesthesia    "I wake up angry" (12/31/2015)  . Diabetes mellitus type 1 ([place]) dx'd 1981  . Diabetic ketoacidosis ([place])   . Essential hypertension 05/03/2013  . GERD (gastroesophageal reflux disease)   . High cholesterol   . Hx MRSA infection    Inner thigh and under arm- healed areas  . Meniscus tear   . Shortness of breath    with exertion only    PSH: Past Surgical History:  Procedure Laterality Date  . AMPUTATION Left 01/01/2016   Procedure: AMPUTATION BELOW KNEE;  Surgeon: [name], MD;  Location: [place];  Service: Orthopedics;  Laterality: Left;  . APPLICATION OF WOUND VAC  04/26/2016  . FRACTURE SURGERY    . HERNIA REPAIR    . I & D EXTREMITY Left 04/26/2016   Procedure: IRRIGATION AND  DEBRIDEMENT EXTREMITY/Left Leg/Possible Wound Vac;  Surgeon: [name], MD;  Location: [place];  Service: Orthopedics;  Laterality: Left;  . INCISE AND DRAIN ABCESS Left 04/26/2016  . KNEE ARTHROSCOPY Left ~ 2010  . LAPAROSCOPIC CHOLECYSTECTOMY  2015  . METACARPOPHALANGEAL JOINT ARTHRODESIS Left 06/2012   Fracture left index finger intra-articular MCP joint/notes 06/30/2012  . OPEN REDUCTION INTERNAL FIXATION (ORIF) PROXIMAL PHALANX Left 06/30/2012   Procedure: OPEN REDUCTION INTERNAL FIXATION (ORIF) LEFT INDEX FINGER PROXIMAL PHALANX FRACTURE WITH LIGAMENT REPAIR AS NECESSARY;  Surgeon: [name], MD;  Location: [place];  Service: Orthopedics;  Laterality: Left;  . UMBILICAL HERNIA REPAIR  2015   "w/gallbladder OR"    Social History:  reports that he has never smoked. He quit smokeless tobacco use about 8 years ago.  His smokeless tobacco use included snuff and chew. He reports current alcohol use of about 14.0 standard drinks of alcohol per week. He reports that he does not use drugs.  Allergies:  Allergies  Allergen Reactions  . Influenza Virus Vaccine Split Nausea And Vomiting  . Hydrochlorothiazide Other (See Comments)    Dizziness  . Lisinopril Cough  . Losartan Other (See Comments)    Dizziness    Medications: No current facility-administered medications for this encounter.   Current Outpatient Medications  Medication Sig Dispense Refill  . aspirin 81 MG chewable tablet Chew 1 tablet (81 mg total)  by mouth daily. 30 tablet 0  . Glatiramer Acetate 40 MG/ML SOSY INJECT 40 MG (ONE SYRINGE) UNDER THE SKIN (SUBCUTANEOUS INJECTION) ON MONDAY WEDNESDAY AND FRIDAY (Patient taking differently: Inject 40 mg into the skin 3 (three) times a week. INJECT 40 MG (ONE SYRINGE) UNDER THE SKIN (SUBCUTANEOUS INJECTION) ON MONDAY WEDNESDAY AND FRIDAY) 12 mL 0  . HYDROcodone-acetaminophen (NORCO/VICODIN) 5-325 MG tablet Take 1 tablet by mouth every 4 (four) hours as needed for severe pain.     .  ibuprofen (ADVIL) 200 MG tablet Take 400-600 mg by mouth every 8 (eight) hours as needed for moderate pain.    . insulin lispro ([name]) 200 UNIT/ML KwikPen Inject 100 Units into the skin daily. For use in pump; Will provide 30 day supply. (Patient taking differently: Inject 100 Units into the skin See admin instructions. For use in pump; sliding scale) 30 mL 0  . losartan (COZAAR) 50 MG tablet 1 po qd (Patient taking differently: Take 50 mg by mouth daily. 1 po qd) 90 tablet 1  . omeprazole (PRILOSEC) 20 MG capsule Take 1 capsule (20 mg total) by mouth daily. 90 capsule 1  . Vitamin D, Ergocalciferol, (DRISDOL) 1.25 MG (50000 UNIT) CAPS capsule 1 tab every Wednesday and Sunday (Patient taking differently: Take 50,000 Units by mouth 2 (two) times a week. 1 tab every Wednesday and Sunday) 24 capsule 6  . BAYER MICROLET LANCETS lancets Use as instructed to check 3 times daily. 100 each 12  . Blood Glucose Monitoring Suppl (ONE TOUCH ULTRA SYSTEM KIT) w/Device KIT 1 kit by Does not apply route once. Check blood sugar 3 times daily. Diagnosis Diabetes ICD-10 E11.8    . Continuous Blood Gluc Receiver (DEXCOM G6 RECEIVER) [place] 1 each by Does not apply route See admin instructions. For use with continuous glucose monitoring system; E11.9 1 each 0  . Continuous Blood Gluc Sensor (DEXCOM G6 SENSOR) MISC APPLY 1 SENSOR AS DIRECTED AND REPLACE EVERY 10 DAYS. 3 each 0  . Continuous Blood Gluc Transmit (DEXCOM G6 TRANSMITTER) MISC FOR USE WITH CONTINUOUS GLUCOSE MONITORING SYSTEM 1 each 0  . glucose blood (BAYER CONTOUR NEXT TEST) test strip 1 each by Other route 4 (four) times daily. And lancets 4/day 400 each 3  . Insulin Disposable Pump (OMNIPOD DASH 5 PACK PODS) MISC 1 each by Does not apply route every 3 (three) days. E10.69 10 each 2  . Insulin Syringes, Disposable, U-100 1 ML MISC 3x daily 50 each 2    No results found for this or any previous visit (from the past 48 hour(s)). No results  found.  ROS: Pain with rom of the right upper extremity  Physical Exam: Alert and oriented 44 y.o. male in no acute distress Cranial nerves 2-12 intact Cervical spine: full rom with no tenderness, nv intact distally Chest: active breath sounds bilaterally, no wheeze rhonchi or rales Heart: regular rate and rhythm, no murmur Abd: non tender non distended with active bowel sounds Hip is stable with rom  Right shoulder with painful rom Tenderness to right AC joint nv intact distally No rashes or edema distally  Assessment/Plan Assessment: right shoulder AC arthrosis, SLAP tear  Plan:  Patient will undergo a right shoulder scope by Dr. Norris at [facility] Risks benefits and expectations were discussed with the patient. Patient understand risks, benefits and expectations and wishes to proceed. Preoperative templating of the joint replacement has been completed, documented, and submitted to the Operating Room personnel in order to optimize   intra-operative equipment management.   Brad  PA-C, MPAS [facility] is now [facility]  [place] [address], [place], [place] [address] Phone: [phone] [email or web address] Facebook  Instagram  LinkedIn  Twitter    

## 2019-06-28 DIAGNOSIS — S88119A Complete traumatic amputation at level between knee and ankle, unspecified lower leg, initial encounter: Secondary | ICD-10-CM | POA: Insufficient documentation

## 2019-06-28 DIAGNOSIS — L0390 Cellulitis, unspecified: Secondary | ICD-10-CM | POA: Diagnosis not present

## 2019-06-28 DIAGNOSIS — Z89519 Acquired absence of unspecified leg below knee: Secondary | ICD-10-CM | POA: Diagnosis not present

## 2019-06-28 NOTE — Patient Instructions (Addendum)
DUE TO COVID-19 ONLY ONE VISITOR IS ALLOWED TO COME WITH YOU AND STAY IN THE WAITING ROOM ONLY DURING PRE OP AND PROCEDURE DAY OF SURGERY. THE 2 VISITOR MAY VISIT WITH YOU AFTER SURGERY IN YOUR PRIVATE ROOM DURING VISITING HOURS ONLY!  YOU NEED TO HAVE A COVID 19 TEST ON_6/24______ @_2 :45______, THIS TEST MUST BE DONE BEFORE SURGERY, COME  [address], [place] , [address].  ([place]) ONCE YOUR COVID TEST IS COMPLETED, PLEASE BEGIN THE QUARANTINE INSTRUCTIONS AS OUTLINED IN YOUR HANDOUT.                Michael Hutchinson    Your procedure is scheduled on: 07/08/19   Report to [facility] Main  Entrance   Report to admitting at  10:35 AM     Call this number if you have problems the morning of surgery [place], NO CHEWING GUM [place] .  Do not eat food After Midnight.   YOU MAY HAVE CLEAR LIQUIDS FROM MIDNIGHT UNTIL 9:30 AM.   At 9:30 AM Please finish the prescribed Pre-Surgery Gatorade drink.   Nothing by mouth after you finish the Gatorade drink !   Take these medicines the morning of surgery with A SIP OF WATER: Prilosec  DO NOT TAKE ANY DIABETIC MEDICATIONS DAY OF YOUR SURGERY  How to Manage Your Diabetes Before and After Surgery  Why is it important to control my blood sugar before and after surgery? . Improving blood sugar levels before and after surgery helps healing and can limit problems. . A way of improving blood sugar control is eating a healthy diet by: o  Eating less sugar and carbohydrates o  Increasing activity/exercise o  Talking with your doctor about reaching your blood sugar goals . High blood sugars (greater than 180 mg/dL) can raise your risk of infections and slow your recovery, so you will need to focus on controlling your diabetes during the weeks before surgery. . Make sure that the doctor who takes care of your diabetes knows about your planned  surgery including the date and location.  How do I manage my blood sugar before surgery? . Check your blood sugar at least 4 times a day, starting 2 days before surgery, to make sure that the level is not too high or low. o Check your blood sugar the morning of your surgery when you wake up and every 2 hours until you get to the Short Stay unit. . If your blood sugar is less than 70 mg/dL, you will need to treat for low blood sugar: o Do not take insulin. o Treat a low blood sugar (less than 70 mg/dL) with  cup of clear juice (cranberry or apple), 4 glucose tablets, OR glucose gel. o Recheck blood sugar in 15 minutes after treatment (to make sure it is greater than 70 mg/dL). If your blood sugar is not greater than 70 mg/dL on recheck, call [phone] for further instructions. . Report your blood sugar to the short stay nurse when you get to Short Stay.  . If you are admitted to the hospital after surgery: o Your blood sugar will be checked by the staff and you will probably be given insulin after surgery (instead of oral diabetes medicines) to make sure you have good blood sugar levels. o The goal for blood sugar control after surgery is 80-180 mg/dL.   WHAT  DO I DO ABOUT MY DIABETES MEDICATION?   . THE NIGHT BEFORE SURGERY, take     units of       insulin.     THE MORNING OF SURGERY, take   units of         insulin. If your CBG is greater than 220 mg/dL, you may take  of your sliding scale  . (correction) dose of insulin.      For patients with insulin pumps:  Contact your diabetes doctor for specific instructions before surgery.  Decrease basal rates by 20% at midnight the night before your surgery.  Note that if your surgery is planned to be longer than 2 hours, your insulin pump will be removed and intravenous (IV) insulin will be started and managed by the nurses and the anesthesiologist.   You will be able to restart your insulin pump once you are awake and able to manage  it.   Make sure to bring insulin pump supplies to the hospital with you in case the   site needs to be changed.  See Policy and fill out worksheet. Bring it with you day os surgery                                 You may not have any metal on your body including              piercings  Do not wear jewelry,  lotions, powders or deodorant                    Men may shave face and neck.   Do not bring valuables to the hospital. [facility] IS NOT             RESPONSIBLE   FOR VALUABLES.  Contacts, dentures or bridgework may not be worn into surgery.      Patients discharged the day of surgery will not be allowed to drive home.   IF YOU ARE HAVING SURGERY AND GOING HOME THE SAME DAY, YOU MUST HAVE AN ADULT TO DRIVE YOU HOME AND BE WITH YOU FOR 24 HOURS.   YOU MAY GO HOME BY TAXI OR UBER OR ORTHERWISE, BUT AN ADULT MUST ACCOMPANY YOU HOME AND STAY WITH YOU FOR 24 HOURS.  Name and phone number of your driver:  Special Instructions: N/A              Please read over the following fact sheets you were given: _____________________________________________________________________             [facility] - Preparing for Surgery Before surgery, you can play an important role .  Because skin is not sterile, your skin needs to be as free of germs as possible .  You can reduce the number of germs on your skin by washing with CHG (chlorahexidine gluconate) soap before surgery.   CHG is an antiseptic cleaner which kills germs and bonds with the skin to continue killing germs even after washing. Please DO NOT use if you have an allergy to CHG or antibacterial soaps .  If your skin becomes reddened/irritated stop using the CHG and inform your nurse when you arrive at Short Stay.   You may shave your face/neck.  Please follow these instructions carefully:  1.  Shower with CHG Soap the night before surgery and the  morning of Surgery.  2.  If you choose to wash  your hair, wash your hair first  as usual with your  normal  shampoo.  3.  After you shampoo, rinse your hair and body thoroughly to remove the  shampoo.                                        4.  Use CHG as you would any other liquid soap.  You can apply chg directly  to the skin and wash                       Gently with a scrungie or clean washcloth.  5.  Apply the CHG Soap to your body ONLY FROM THE NECK DOWN.   Do not use on face/ open                           Wound or open sores. Avoid contact with eyes, ears mouth and genitals (private parts).                       Wash face,  Genitals (private parts) with your normal soap.             6.  Wash thoroughly, paying special attention to the area where your surgery  will be performed.  7.  Thoroughly rinse your body with warm water from the neck down.  8.  DO NOT shower/wash with your normal soap after using and rinsing off  the CHG Soap.             9.  Pat yourself dry with a clean towel.            10.  Wear clean pajamas.            11.  Place clean sheets on your bed the night of your first shower and do not  sleep with pets. Day of Surgery : Do not apply any lotions/deodorants the morning of surgery.  Please wear clean clothes to the hospital/surgery center.  FAILURE TO FOLLOW THESE INSTRUCTIONS MAY RESULT IN THE CANCELLATION OF YOUR SURGERY PATIENT SIGNATURE_________________________________  NURSE SIGNATURE__________________________________  ________________________________________________________________________   Michael Hutchinson  An incentive spirometer is a tool that can help keep your lungs clear and active. This tool measures how well you are filling your lungs with each breath. Taking long deep breaths may help reverse or decrease the chance of developing breathing (pulmonary) problems (especially infection) following:  A long period of time when you are unable to move or be active. BEFORE THE PROCEDURE   If the spirometer includes an indicator  to show your best effort, your nurse or respiratory therapist will set it to a desired goal.  If possible, sit up straight or lean slightly forward. Try not to slouch.  Hold the incentive spirometer in an upright position. INSTRUCTIONS FOR USE  1. Sit on the edge of your bed if possible, or sit up as far as you can in bed or on a chair. 2. Hold the incentive spirometer in an upright position. 3. Breathe out normally. 4. Place the mouthpiece in your mouth and seal your lips tightly around it. 5. Breathe in slowly and as deeply as possible, raising the piston or the ball toward the top of the column. 6. Hold your breath for 3-5 seconds or for as long as possible.  Allow the piston or ball to fall to the bottom of the column. 7. Remove the mouthpiece from your mouth and breathe out normally. 8. Rest for a few seconds and repeat Steps 1 through 7 at least 10 times every 1-2 hours when you are awake. Take your time and take a few normal breaths between deep breaths. 9. The spirometer may include an indicator to show your best effort. Use the indicator as a goal to work toward during each repetition. 10. After each set of 10 deep breaths, practice coughing to be sure your lungs are clear. If you have an incision (the cut made at the time of surgery), support your incision when coughing by placing a pillow or rolled up towels firmly against it. Once you are able to get out of bed, walk around indoors and cough well. You may stop using the incentive spirometer when instructed by your caregiver.  RISKS AND COMPLICATIONS  Take your time so you do not get dizzy or light-headed.  If you are in pain, you may need to take or ask for pain medication before doing incentive spirometry. It is harder to take a deep breath if you are having pain. AFTER USE  Rest and breathe slowly and easily.  It can be helpful to keep track of a log of your progress. Your caregiver can provide you with a simple table to help  with this. If you are using the spirometer at home, follow these instructions: SEEK MEDICAL CARE IF:   You are having difficultly using the spirometer.  You have trouble using the spirometer as often as instructed.  Your pain medication is not giving enough relief while using the spirometer.  You develop fever of 100.5 F (38.1 C) or higher. SEEK IMMEDIATE MEDICAL CARE IF:   You cough up bloody sputum that had not been present before.  You develop fever of 102 F (38.9 C) or greater.  You develop worsening pain at or near the incision site. MAKE SURE YOU:   Understand these instructions.  Will watch your condition.  Will get help right away if you are not doing well or get worse. Document Released: 05/09/2006 Document Revised: 03/21/2011 Document Reviewed: 07/10/2006 [facility] Patient Information 2014 [place], [place].   ________________________________________________________________________

## 2019-07-01 ENCOUNTER — Encounter (HOSPITAL_COMMUNITY): Payer: Self-pay

## 2019-07-01 ENCOUNTER — Encounter (HOSPITAL_COMMUNITY): Admission: RE | Admit: 2019-07-01 | Payer: BC Managed Care – PPO | Source: Ambulatory Visit

## 2019-07-01 ENCOUNTER — Other Ambulatory Visit: Payer: Self-pay

## 2019-07-01 ENCOUNTER — Encounter (HOSPITAL_COMMUNITY)
Admission: RE | Admit: 2019-07-01 | Discharge: 2019-07-01 | Disposition: A | Discharge status: Home / Self Care | Payer: BC Managed Care – PPO | Source: Ambulatory Visit | Attending: Orthopedic Surgery | Admitting: Orthopedic Surgery

## 2019-07-01 DIAGNOSIS — Z01818 Encounter for other preprocedural examination: Secondary | ICD-10-CM | POA: Diagnosis not present

## 2019-07-01 NOTE — Progress Notes (Signed)
COVID Vaccine Completed:Yes Date COVID Vaccine completed:04/2019 COVID vaccine manufacturer: Pfizer      PCP - M. Abonza PA Cardiologist -no   Chest x-ray - no EKG - 07/03/19 Stress Test - no ECHO - no Cardiac Cath - no  Sleep Study - no CPAP -   Fasting Blood Sugar - 110-180 Checks Blood Sugar _____ times a day. continuous  Blood Thinner Instructions:ASA Aspirin Instructions:none. Pt will call MD Last Dose:  Anesthesia review:   Patient denies shortness of breath, fever, cough and chest pain at PAT appointment  yes Patient verbalized understanding of instructions that were given to them at the PAT appointment. Patient was also instructed that they will need to review over the PAT instructions again at home before surgery.yes   Pt will also call MD that manages his insulin pump prior to DOS.BMI 50.66

## 2019-07-03 ENCOUNTER — Other Ambulatory Visit: Payer: Self-pay

## 2019-07-03 ENCOUNTER — Encounter (HOSPITAL_COMMUNITY)
Admission: RE | Admit: 2019-07-03 | Discharge: 2019-07-03 | Disposition: A | Discharge status: Home / Self Care | Payer: BC Managed Care – PPO | Source: Ambulatory Visit | Attending: Orthopedic Surgery | Admitting: Orthopedic Surgery

## 2019-07-03 ENCOUNTER — Telehealth: Payer: Self-pay | Admitting: Physician Assistant

## 2019-07-03 ENCOUNTER — Inpatient Hospital Stay (HOSPITAL_COMMUNITY): Admission: RE | Admit: 2019-07-03 | Payer: BC Managed Care – PPO | Source: Ambulatory Visit

## 2019-07-03 ENCOUNTER — Encounter (HOSPITAL_COMMUNITY): Payer: Self-pay | Admitting: Anesthesiology

## 2019-07-03 ENCOUNTER — Encounter (HOSPITAL_COMMUNITY): Payer: Self-pay | Admitting: Physician Assistant

## 2019-07-03 DIAGNOSIS — Z01812 Encounter for preprocedural laboratory examination: Secondary | ICD-10-CM | POA: Diagnosis not present

## 2019-07-03 LAB — CBC
HCT: 41.3 % (ref 39.0–52.0)
Hemoglobin: 13.4 g/dL (ref 13.0–17.0)
MCH: 30.5 pg (ref 26.0–34.0)
MCHC: 32.4 g/dL (ref 30.0–36.0)
MCV: 94.1 fL (ref 80.0–100.0)
Platelets: 266 10*3/uL (ref 150–400)
RBC: 4.39 MIL/uL (ref 4.22–5.81)
RDW: 13.2 % (ref 11.5–15.5)
WBC: 6.8 10*3/uL (ref 4.0–10.5)
nRBC: 0 % (ref 0.0–0.2)

## 2019-07-03 LAB — BASIC METABOLIC PANEL
Anion gap: 10 (ref 5–15)
BUN: 18 mg/dL (ref 6–20)
CO2: 26 mmol/L (ref 22–32)
Calcium: 9 mg/dL (ref 8.9–10.3)
Chloride: 101 mmol/L (ref 98–111)
Creatinine, Ser: 1.13 mg/dL (ref 0.61–1.24)
GFR calc Af Amer: >60 mL/min (ref >60)
GFR calc non Af Amer: >60 mL/min (ref >60)
Glucose, Bld: 127 mg/dL — ABNORMAL HIGH (ref 70–99)
Potassium: 5 mmol/L (ref 3.5–5.1)
Sodium: 137 mmol/L (ref 135–145)

## 2019-07-03 LAB — GLUCOSE, CAPILLARY: Glucose-Capillary: 133 mg/dL — ABNORMAL HIGH (ref 70–99)

## 2019-07-03 LAB — HEMOGLOBIN A1C
Hgb A1c MFr Bld: 7.5 % — ABNORMAL HIGH (ref 4.8–5.6)
Mean Plasma Glucose: 168.55 mg/dL

## 2019-07-03 NOTE — Telephone Encounter (Signed)
Patient's wife called wanted to bring pt by to have BP checked states he was told it was extremely high & he needed to get it checked (advised her no medical staff here in office to do that & advised them to seek Immediate Medical Attention at nearest Urgent Care or Emergency Room).  ---FYI to medical staff fr next day review / follow up.  --glh

## 2019-07-04 ENCOUNTER — Other Ambulatory Visit (HOSPITAL_COMMUNITY): Payer: BC Managed Care – PPO

## 2019-07-04 ENCOUNTER — Other Ambulatory Visit (HOSPITAL_COMMUNITY)
Admission: RE | Admit: 2019-07-04 | Discharge: 2019-07-04 | Disposition: A | Discharge status: Home / Self Care | Payer: BC Managed Care – PPO | Source: Ambulatory Visit | Attending: Orthopedic Surgery | Admitting: Orthopedic Surgery

## 2019-07-04 DIAGNOSIS — Z89612 Acquired absence of left leg above knee: Secondary | ICD-10-CM | POA: Diagnosis not present

## 2019-07-04 DIAGNOSIS — L97909 Non-pressure chronic ulcer of unspecified part of unspecified lower leg with unspecified severity: Secondary | ICD-10-CM | POA: Diagnosis not present

## 2019-07-04 DIAGNOSIS — Z20822 Contact with and (suspected) exposure to covid-19: Secondary | ICD-10-CM | POA: Insufficient documentation

## 2019-07-04 DIAGNOSIS — L97929 Non-pressure chronic ulcer of unspecified part of left lower leg with unspecified severity: Secondary | ICD-10-CM | POA: Diagnosis not present

## 2019-07-04 DIAGNOSIS — Z01812 Encounter for preprocedural laboratory examination: Secondary | ICD-10-CM | POA: Diagnosis not present

## 2019-07-04 LAB — SARS CORONAVIRUS 2 (TAT 6-24 HRS): SARS Coronavirus 2: NEGATIVE

## 2019-07-04 NOTE — Telephone Encounter (Signed)
Spoke with Michael Hutchinson and she advised since his BP was 222/116 at pre-admit that he needed to go to ED or UC for evaluation. I have attempted to call patient back at number listed below with no answer. Left a message with this information and for them to call back with questions. AS, CMA

## 2019-07-05 NOTE — Progress Notes (Addendum)
Anesthesia Chart Review   Case: [id number] Date/Time: 07/08/19 1220   Procedure: SHOULDER ARTHROSCOPY WITH SUBACROMIAL DECOMPRESSION AND DISTAL CLAVICLE EXCISION with biceps tenotomy vs biceps tenodesis (Right ) - interscalene block   Anesthesia type: General   Pre-op diagnosis: Right shoulder SLAP tear AC arthrosis   Location: [facility] ROOM 05 / [facility]   Surgeons: Netta Cedars, MD      DISCUSSION:44 y.o. never smoker with h/o GERD, DM I (insulin pump, instructions given by PAT nurse), HTN, right shoulder SLAP tear scheduled for above procedure 07/08/2019 with Dr. Netta Cedars.   Elevated BP at PAT visit, 222/116.  I did not see this patient in person.  PAT nurse advised pt to call PCP office same day, reports pt asymptomatic.  Per PCP note pt advised to be seen in UC/ED.  Pt reports he did not go, that the wrong size BP cuff was used at PAT and his BP is typically not that high.  Pt currently taking Losartan.  Pt reports he will start checking is BP at home and will report to PCP if elevated readings.  Pt was made aware that if BP elevated DOS risk of cancellation.  Dr. Veverly Fells' office made aware.   Discussed with Dr. Lissa Hoard.  VS: BP (!) 222/116   Pulse (!) 113   Temp 36.8 C (Oral)   Resp 16   Ht 6' (1.829 m)   Wt (!) 168.7 kg   SpO2 100%   BMI 50.45 kg/m   PROVIDERS: Lorrene Reid, PA-C is PCP    LABS: Labs reviewed: Acceptable for surgery. (all labs ordered are listed, but only abnormal results are displayed)  Labs Reviewed  BASIC METABOLIC PANEL - Abnormal; Notable for the following components:      Result Value   Glucose, Bld 127 (*)    All other components within normal limits  HEMOGLOBIN A1C - Abnormal; Notable for the following components:   Hgb A1c MFr Bld 7.5 (*)    All other components within normal limits  GLUCOSE, CAPILLARY - Abnormal; Notable for the following components:   Glucose-Capillary 133 (*)    All other components within normal limits  CBC      IMAGES:   EKG: 07/03/2019 Rate 112 bpm  Sinus tachycardia    CV:  Past Medical History:  Diagnosis Date  . Charcot's joint of foot 11/25/2013  . Complication of anesthesia    "I wake up angry" (12/31/2015)  . Diabetes mellitus type 1 ([place]) dx'd 1981  . Diabetic ketoacidosis ([place])   . Essential hypertension 05/03/2013  . GERD (gastroesophageal reflux disease)   . High cholesterol   . Hx MRSA infection    Inner thigh and under arm- healed areas  . Meniscus tear   . Shortness of breath    with exertion only    Past Surgical History:  Procedure Laterality Date  . AMPUTATION Left 01/01/2016   Procedure: AMPUTATION BELOW KNEE;  Surgeon: [name], MD;  Location: [place];  Service: Orthopedics;  Laterality: Left;  . APPLICATION OF WOUND VAC  04/26/2016  . FRACTURE SURGERY    . HERNIA REPAIR    . I & D EXTREMITY Left 04/26/2016   Procedure: IRRIGATION AND DEBRIDEMENT EXTREMITY/Left Leg/Possible Wound Vac;  Surgeon: [name], MD;  Location: [place];  Service: Orthopedics;  Laterality: Left;  . INCISE AND DRAIN ABCESS Left 04/26/2016  . KNEE ARTHROSCOPY Left ~ 2010  . LAPAROSCOPIC CHOLECYSTECTOMY  2015  . METACARPOPHALANGEAL JOINT ARTHRODESIS Left 06/2012  Fracture left index finger intra-articular MCP joint/notes 06/30/2012  . OPEN REDUCTION INTERNAL FIXATION (ORIF) PROXIMAL PHALANX Left 06/30/2012   Procedure: OPEN REDUCTION INTERNAL FIXATION (ORIF) LEFT INDEX FINGER PROXIMAL PHALANX FRACTURE WITH LIGAMENT REPAIR AS NECESSARY;  Surgeon: [name], MD;  Location: [facility];  Service: Orthopedics;  Laterality: Left;  . UMBILICAL HERNIA REPAIR  2015   "w/gallbladder OR"    MEDICATIONS: . aspirin 81 MG chewable tablet  . BAYER MICROLET LANCETS lancets  . Blood Glucose Monitoring Suppl (ONE TOUCH ULTRA SYSTEM KIT) w/Device KIT  . Continuous Blood Gluc Receiver ([place]) [place]  . Continuous Blood Gluc Sensor (DEXCOM G6 SENSOR) MISC  . Continuous Blood Gluc  Transmit (DEXCOM G6 TRANSMITTER) MISC  . Glatiramer Acetate 40 MG/ML SOSY  . glucose blood (BAYER CONTOUR NEXT TEST) test strip  . HYDROcodone-acetaminophen (NORCO/VICODIN) 5-325 MG tablet  . ibuprofen (ADVIL) 200 MG tablet  . Insulin Disposable Pump (OMNIPOD DASH 5 PACK PODS) MISC  . insulin lispro ([name]) 200 UNIT/ML KwikPen  . Insulin Syringes, Disposable, U-100 1 ML MISC  . losartan (COZAAR) 50 MG tablet  . omeprazole (PRILOSEC) 20 MG capsule  . Vitamin D, Ergocalciferol, (DRISDOL) 1.25 MG (50000 UNIT) CAPS capsule   No current facility-administered medications for this encounter.     Maia Plan [facility] Pre-Surgical Testing [phone] 07/05/19  3:51 PM

## 2019-07-07 MED ORDER — DEXTROSE 5 % IV SOLN
3.0000 g | INTRAVENOUS | Status: DC
  Filled 2019-07-07: qty 3000

## 2019-07-08 ENCOUNTER — Telehealth: Payer: Self-pay | Admitting: Physician Assistant

## 2019-07-08 ENCOUNTER — Ambulatory Visit (HOSPITAL_COMMUNITY)
Admission: RE | Admit: 2019-07-08 | Discharge: 2019-07-08 | Disposition: A | Discharge status: Home / Self Care | Payer: BC Managed Care – PPO | Attending: Orthopedic Surgery | Admitting: Orthopedic Surgery

## 2019-07-08 ENCOUNTER — Encounter (HOSPITAL_COMMUNITY)
Admission: RE | Disposition: A | Discharge status: Home / Self Care | Payer: Self-pay | Source: Home / Self Care | Attending: Orthopedic Surgery

## 2019-07-08 DIAGNOSIS — K219 Gastro-esophageal reflux disease without esophagitis: Secondary | ICD-10-CM | POA: Diagnosis not present

## 2019-07-08 DIAGNOSIS — I10 Essential (primary) hypertension: Secondary | ICD-10-CM | POA: Diagnosis not present

## 2019-07-08 DIAGNOSIS — Z7982 Long term (current) use of aspirin: Secondary | ICD-10-CM | POA: Diagnosis not present

## 2019-07-08 DIAGNOSIS — Z794 Long term (current) use of insulin: Secondary | ICD-10-CM | POA: Insufficient documentation

## 2019-07-08 DIAGNOSIS — E109 Type 1 diabetes mellitus without complications: Secondary | ICD-10-CM | POA: Insufficient documentation

## 2019-07-08 DIAGNOSIS — Z791 Long term (current) use of non-steroidal anti-inflammatories (NSAID): Secondary | ICD-10-CM | POA: Diagnosis not present

## 2019-07-08 DIAGNOSIS — E7800 Pure hypercholesterolemia, unspecified: Secondary | ICD-10-CM | POA: Insufficient documentation

## 2019-07-08 DIAGNOSIS — Z9049 Acquired absence of other specified parts of digestive tract: Secondary | ICD-10-CM | POA: Insufficient documentation

## 2019-07-08 DIAGNOSIS — Z79899 Other long term (current) drug therapy: Secondary | ICD-10-CM | POA: Diagnosis not present

## 2019-07-08 DIAGNOSIS — Z9641 Presence of insulin pump (external) (internal): Secondary | ICD-10-CM | POA: Insufficient documentation

## 2019-07-08 DIAGNOSIS — Z89512 Acquired absence of left leg below knee: Secondary | ICD-10-CM | POA: Diagnosis not present

## 2019-07-08 DIAGNOSIS — Z888 Allergy status to other drugs, medicaments and biological substances status: Secondary | ICD-10-CM | POA: Insufficient documentation

## 2019-07-08 DIAGNOSIS — Z887 Allergy status to serum and vaccine status: Secondary | ICD-10-CM | POA: Diagnosis not present

## 2019-07-08 DIAGNOSIS — M25511 Pain in right shoulder: Secondary | ICD-10-CM | POA: Diagnosis present

## 2019-07-08 DIAGNOSIS — Z884 Allergy status to anesthetic agent status: Secondary | ICD-10-CM | POA: Insufficient documentation

## 2019-07-08 DIAGNOSIS — Z5309 Procedure and treatment not carried out because of other contraindication: Secondary | ICD-10-CM | POA: Insufficient documentation

## 2019-07-08 DIAGNOSIS — M19011 Primary osteoarthritis, right shoulder: Secondary | ICD-10-CM | POA: Insufficient documentation

## 2019-07-08 DIAGNOSIS — R009 Unspecified abnormalities of heart beat: Secondary | ICD-10-CM

## 2019-07-08 DIAGNOSIS — I152 Hypertension secondary to endocrine disorders: Secondary | ICD-10-CM

## 2019-07-08 LAB — GLUCOSE, CAPILLARY: Glucose-Capillary: 183 mg/dL — ABNORMAL HIGH (ref 70–99)

## 2019-07-08 SURGERY — SHOULDER ARTHROSCOPY WITH SUBACROMIAL DECOMPRESSION AND DISTAL CLAVICLE EXCISION
Anesthesia: General | Laterality: Right

## 2019-07-08 MED ORDER — FENTANYL CITRATE (PF) 100 MCG/2ML IJ SOLN
INTRAMUSCULAR | Status: AC
  Filled 2019-07-08: qty 2

## 2019-07-08 MED ORDER — FENTANYL CITRATE (PF) 100 MCG/2ML IJ SOLN
50.0000 ug | Freq: Once | INTRAMUSCULAR | Status: DC
  Filled 2019-07-08: qty 2

## 2019-07-08 MED ORDER — CHLORHEXIDINE GLUCONATE 0.12 % MT SOLN: 15.0000 mL | Freq: Once | OROMUCOSAL | Status: DC

## 2019-07-08 MED ORDER — MIDAZOLAM HCL 2 MG/2ML IJ SOLN
1.0000 mg | Freq: Once | INTRAMUSCULAR | Status: DC
  Filled 2019-07-08: qty 2

## 2019-07-08 MED ORDER — PROPOFOL 10 MG/ML IV BOLUS
INTRAVENOUS | Status: AC
  Filled 2019-07-08: qty 40

## 2019-07-08 MED ORDER — LIDOCAINE 2% (20 MG/ML) 5 ML SYRINGE
INTRAMUSCULAR | Status: AC
  Filled 2019-07-08: qty 5

## 2019-07-08 MED ORDER — SUCCINYLCHOLINE CHLORIDE 200 MG/10ML IV SOSY
PREFILLED_SYRINGE | INTRAVENOUS | Status: AC
  Filled 2019-07-08: qty 10

## 2019-07-08 MED ORDER — METOPROLOL SUCCINATE ER 50 MG PO TB24: 50.0000 mg | ORAL_TABLET | Freq: Every day | ORAL | 2 refills | Status: DC

## 2019-07-08 MED ORDER — ORAL CARE MOUTH RINSE: 15.0000 mL | Freq: Once | OROMUCOSAL | Status: DC

## 2019-07-08 MED ORDER — LACTATED RINGERS IV SOLN: INTRAVENOUS | Status: DC

## 2019-07-08 NOTE — Progress Notes (Signed)
Paged diabetic coordinator to notify of pt w insulin pump Awaiting return call back

## 2019-07-08 NOTE — Telephone Encounter (Signed)
Patient is aware of the below and of cardiology referral and verbalized understanding. AS, CMA

## 2019-07-08 NOTE — Progress Notes (Signed)
Per Dr. Okey Dupre, surgery cancelled due to HR 119 today ;  Dr. Okey Dupre Advised patient to follow up with PCP and possibly need to be put on beta blockers

## 2019-07-08 NOTE — Progress Notes (Signed)
Patient's pulse 120-125 at rest. BP remained elevated at 160/90 to 180/100. Spoke with patient that the safest thing to do was to cancel and reschedule he has been beta-blocked. Goal pulse of <100 at rest.  Dr. Ranell Patrick informed and agreed with plan, will reschedule

## 2019-07-08 NOTE — Telephone Encounter (Signed)
I will send in rx for Metoprolol Succinate 50 mg take 1 tablet PO once daily. Patient should take his BP and HR once daily to see if his BP and pulse are improving. I would like for him to call the office in 7 days and let me know what his BP and HR log have been to determine if dose needs to be adjusted.   Thank you, Kandis Cocking

## 2019-07-08 NOTE — Addendum Note (Signed)
Addended by: Sylvester Harder on: 07/08/2019 01:10 PM   Modules accepted: Orders

## 2019-07-08 NOTE — Progress Notes (Signed)
Spoke with [facility] diabetic coord whom is aware of pts arrival

## 2019-07-08 NOTE — Telephone Encounter (Signed)
Verbal order for cardiology referral per [facility]. This has been ordered.   Left message for patient to call back. AS< CMA

## 2019-07-08 NOTE — Telephone Encounter (Signed)
Patient was scheduled today to have shoulder surgery but it was canceled due to patients HR.   Patient states his HR today was 118.   At last office visit in May pt HR was 124.    Patient requesting advise or medication change.     [place] [facility]

## 2019-07-11 ENCOUNTER — Other Ambulatory Visit: Payer: Self-pay

## 2019-07-11 ENCOUNTER — Encounter: Payer: Self-pay | Admitting: Cardiovascular Disease

## 2019-07-11 ENCOUNTER — Ambulatory Visit (INDEPENDENT_AMBULATORY_CARE_PROVIDER_SITE_OTHER): Payer: BC Managed Care – PPO | Admitting: Cardiovascular Disease

## 2019-07-11 VITALS — BP 138/90 | HR 103 | Ht 72.0 in | Wt 372.0 lb

## 2019-07-11 DIAGNOSIS — E785 Hyperlipidemia, unspecified: Secondary | ICD-10-CM

## 2019-07-11 DIAGNOSIS — R0602 Shortness of breath: Secondary | ICD-10-CM | POA: Diagnosis not present

## 2019-07-11 DIAGNOSIS — Z01818 Encounter for other preprocedural examination: Secondary | ICD-10-CM | POA: Diagnosis not present

## 2019-07-11 DIAGNOSIS — I10 Essential (primary) hypertension: Secondary | ICD-10-CM

## 2019-07-11 DIAGNOSIS — R000 Tachycardia, unspecified: Secondary | ICD-10-CM | POA: Diagnosis not present

## 2019-07-11 NOTE — Patient Instructions (Signed)
Medication Instructions:  Your physician recommends that you continue on your current medications as directed. Please refer to the Current Medication list given to you today.  *If you need a refill on your cardiac medications before your next appointment, please call your pharmacy*   Lab Work: None ordered If you have labs (blood work) drawn today and your tests are completely normal, you will receive your results only by:  MyChart Message (if you have MyChart) OR  A paper copy in the mail If you have any lab test that is abnormal or we need to change your treatment, we will call you to review the results.   Testing/Procedures: Your physician has requested that you have an echocardiogram. Echocardiography is a painless test that uses sound waves to create images of your heart. It provides your doctor with information about the size and shape of your heart and how well your hearts chambers and valves are working. This procedure takes approximately one hour. There are no restrictions for this procedure.  Your physician has requested that you have a lexiscan myoview. For further information please visit [email or web address] Please follow instruction sheet, as given.     Follow-Up: At [facility], you and your health needs are our priority.  As part of our continuing mission to provide you with exceptional heart care, we have created designated Provider Care Teams.  These Care Teams include your primary Cardiologist (physician) and Advanced Practice Providers (APPs -  Physician Assistants and Nurse Practitioners) who all work together to provide you with the care you need, when you need it.  We recommend signing up for the patient portal called "MyChart".  Sign up information is provided on this After Visit Summary.  MyChart is used to connect with patients for Virtual Visits (Telemedicine).  Patients are able to view lab/test results, encounter notes, upcoming appointments, etc.   Non-urgent messages can be sent to your provider as well.   To learn more about what you can do with MyChart, go to [email or web address].    Your next appointment:   As needed   The format for your next appointment:   In Person  Provider:    You may see Dr. Kirke Corin or one of the following Advanced Practice Providers on your designated Care Team:    Nicolasa Ducking, NP  Eula Listen, PA-C  Marisue Ivan, PA-C    Other Instructions [facility] MYOVIEW  Your caregiver has ordered a Stress Test with nuclear imaging. The purpose of this test is to evaluate the blood supply to your heart muscle. This procedure is referred to as a "Non-Invasive Stress Test." This is because other than having an IV started in your vein, nothing is inserted or "invades" your body. Cardiac stress tests are done to find areas of poor blood flow to the heart by determining the extent of coronary artery disease (CAD). Some patients exercise on a treadmill, which naturally increases the blood flow to your heart, while others who are  unable to walk on a treadmill due to physical limitations have a pharmacologic/chemical stress agent called Lexiscan . This medicine will mimic walking on a treadmill by temporarily increasing your coronary blood flow.   Please note: these test may take anywhere between 2-4 hours to complete  PLEASE REPORT TO [facility] MEDICAL MALL ENTRANCE  THE VOLUNTEERS AT THE FIRST DESK WILL DIRECT YOU WHERE TO GO  Date of Procedure:_____________________________________  Arrival Time for Procedure:______________________________  Instructions regarding medication:   _X___ : Hold diabetes medication morning of  procedure  _X___:  Hold betablocker(Metoprolol) night before procedure and morning of procedure  PLEASE NOTIFY THE OFFICE AT LEAST 24 HOURS IN ADVANCE IF YOU ARE UNABLE TO KEEP YOUR APPOINTMENT.  [phone] AND  PLEASE NOTIFY NUCLEAR MEDICINE AT [facility] AT LEAST 24 HOURS IN ADVANCE IF YOU ARE  UNABLE TO KEEP YOUR APPOINTMENT. [phone]  How to prepare for your Myoview test:  1. Do not eat or drink after midnight 2. No caffeine for 24 hours prior to test 3. No smoking 24 hours prior to test. 4. Your medication may be taken with water.  If your doctor stopped a medication because of this test, do not take that medication. 5. Ladies, please do not wear dresses.  Skirts or pants are appropriate. Please wear a short sleeve shirt. 6. No perfume, cologne or lotion. 7. Wear comfortable walking shoes. No heels!

## 2019-07-11 NOTE — Progress Notes (Signed)
Cardiology Office Note   Date:  07/11/2019   ID:  Michael Hutchinson, DOB [birth year], MRN [id number]  PCP:  Lorrene Reid, PA-C  Cardiologist:   Kathlyn Sacramento, MD   Chief Complaint  Patient presents with   New Patient (Initial Visit)    Patient c.o elevated BP and HR. Me ds reviewed verbally with patient.       History of Present Illness: Michael Hutchinson is a 44 y.o. male who was referred by Lorrene Reid for evaluation of sinus tachycardia and preop cardiovascular evaluation for shoulder surgery.  He has no prior cardiac history.  He has prolonged history of type 1 diabetes for 40 years, essential hypertension, hyperlipidemia and Charcot foot.  He is status post left below the knee amputation after he had a fracture that never healed and was complicated by infection.   He was scheduled recently for right shoulder surgery but was noted to be tachycardic with a heart rate 124 bpm.  Due to that, the surgery was canceled pending further evaluation.  The patient was started on Toprol 50 mg once daily on Monday with improvement in heart rate.  He denies chest pain.  However, he does complain of exertional dyspnea and occasional palpitations.  He reports that his resting heart rate has always been on the high side close to 100 bpm.  He had routine labs done recently which showed normal basic metabolic profile and normal CBC.  He has no history of thyroid disease. He is not a smoker.  His father had myocardial infarction at the age of 82.    Past Medical History:  Diagnosis Date   Charcot's joint of foot 49/67/5916   Complication of anesthesia    "I wake up angry" (12/31/2015)   Diabetes mellitus type 1 ([place]) dx'd 1981   Diabetic ketoacidosis ([place])    Essential hypertension 05/03/2013   GERD (gastroesophageal reflux disease)    High cholesterol    Hx MRSA infection    Inner thigh and under arm- healed areas   Meniscus tear    Shortness of breath    with exertion only     Past Surgical History:  Procedure Laterality Date   AMPUTATION Left 01/01/2016   Procedure: AMPUTATION BELOW KNEE;  Surgeon: [name], MD;  Location: [place];  Service: Orthopedics;  Laterality: Left;   APPLICATION OF WOUND VAC  04/26/2016   FRACTURE SURGERY     HERNIA REPAIR     I & D EXTREMITY Left 04/26/2016   Procedure: IRRIGATION AND DEBRIDEMENT EXTREMITY/Left Leg/Possible Wound Vac;  Surgeon: [name], MD;  Location: [place];  Service: Orthopedics;  Laterality: Left;   INCISE AND DRAIN ABCESS Left 04/26/2016   KNEE ARTHROSCOPY Left ~ 2010   LAPAROSCOPIC CHOLECYSTECTOMY  2015   METACARPOPHALANGEAL JOINT ARTHRODESIS Left 06/2012   Fracture left index finger intra-articular MCP joint/notes 06/30/2012   OPEN REDUCTION INTERNAL FIXATION (ORIF) PROXIMAL PHALANX Left 06/30/2012   Procedure: OPEN REDUCTION INTERNAL FIXATION (ORIF) LEFT INDEX FINGER PROXIMAL PHALANX FRACTURE WITH LIGAMENT REPAIR AS NECESSARY;  Surgeon: [name], MD;  Location: [place];  Service: Orthopedics;  Laterality: Left;   UMBILICAL HERNIA REPAIR  2015   "w/gallbladder OR"     Current Outpatient Medications  Medication Sig Dispense Refill   aspirin 81 MG chewable tablet Chew 1 tablet (81 mg total) by mouth daily. 30 tablet 0   BAYER MICROLET LANCETS lancets Use as instructed to check 3 times daily. 100 each 12   Blood  Glucose Monitoring Suppl (ONE TOUCH ULTRA SYSTEM KIT) w/Device KIT 1 kit by Does not apply route once. Check blood sugar 3 times daily. Diagnosis Diabetes ICD-10 E11.8     Continuous Blood Gluc Receiver ([place]) [place] 1 each by Does not apply route See admin instructions. For use with continuous glucose monitoring system; E11.9 1 each 0   Continuous Blood Gluc Sensor (DEXCOM G6 SENSOR) MISC APPLY 1 SENSOR AS DIRECTED AND REPLACE EVERY 10 DAYS. 3 each 0   Continuous Blood Gluc Transmit (DEXCOM G6 TRANSMITTER) MISC FOR USE WITH CONTINUOUS GLUCOSE MONITORING SYSTEM 1 each  0   Glatiramer Acetate 40 MG/ML SOSY INJECT 40 MG (ONE SYRINGE) UNDER THE SKIN (SUBCUTANEOUS INJECTION) ON MONDAY WEDNESDAY AND FRIDAY (Patient taking differently: Inject 40 mg into the skin 3 (three) times a week. INJECT 40 MG (ONE SYRINGE) UNDER THE SKIN (SUBCUTANEOUS INJECTION) ON MONDAY WEDNESDAY AND FRIDAY) 12 mL 0   glucose blood (BAYER CONTOUR NEXT TEST) test strip 1 each by Other route 4 (four) times daily. And lancets 4/day 400 each 3   HYDROcodone-acetaminophen (NORCO/VICODIN) 5-325 MG tablet Take 1 tablet by mouth every 4 (four) hours as needed for severe pain.      ibuprofen (ADVIL) 200 MG tablet Take 400-600 mg by mouth every 8 (eight) hours as needed for moderate pain.     Insulin Disposable Pump (OMNIPOD DASH 5 PACK PODS) MISC 1 each by Does not apply route every 3 (three) days. E10.69 10 each 2   insulin lispro ([name]) 200 UNIT/ML KwikPen Inject 100 Units into the skin daily. For use in pump; Will provide 30 day supply. (Patient taking differently: Inject 100 Units into the skin See admin instructions. For use in pump; sliding scale) 30 mL 0   Insulin Syringes, Disposable, U-100 1 ML MISC 3x daily 50 each 2   losartan (COZAAR) 50 MG tablet 1 po qd (Patient taking differently: Take 50 mg by mouth daily. 1 po qd) 90 tablet 1   metoprolol succinate (TOPROL-XL) 50 MG 24 hr tablet Take 1 tablet (50 mg total) by mouth daily. Take with or immediately following a meal. 30 tablet 2   omeprazole (PRILOSEC) 20 MG capsule Take 1 capsule (20 mg total) by mouth daily. 90 capsule 1   Vitamin D, Ergocalciferol, (DRISDOL) 1.25 MG (50000 UNIT) CAPS capsule 1 tab every Wednesday and Sunday (Patient taking differently: Take 50,000 Units by mouth 2 (two) times a week. 1 tab every Wednesday and Sunday) 24 capsule 6   No current facility-administered medications for this visit.    Allergies:   Influenza virus vaccine split, Hydrochlorothiazide, and Lisinopril    Social History:  The  patient  reports that he has never smoked. He quit smokeless tobacco use about 8 years ago.  His smokeless tobacco use included snuff and chew. He reports current alcohol use of about 14.0 standard drinks of alcohol per week. He reports that he does not use drugs.   Family History:  The patient's family history includes Alcohol abuse in his paternal grandfather and sister; Cancer in his mother; Diabetes in his maternal grandfather and another family member; Heart attack in his father; Hyperlipidemia in his father; Hypertension in his father; Other in his mother; Stroke in his paternal grandmother.    ROS:  Please see the history of present illness.   Otherwise, review of systems are positive for none.   All other systems are reviewed and negative.    PHYSICAL EXAM: VS:  BP  138/90 (BP Location: Left Arm, Patient Position: Sitting, Cuff Size: Normal)    Pulse (!) 103    Ht 6' (1.829 m)    Wt (!) 372 lb (168.7 kg)    SpO2 98%    BMI 50.45 kg/m  , BMI Body mass index is 50.45 kg/m. GEN: Well nourished, well developed, in no acute distress  HEENT: normal  Neck: no JVD, carotid bruits, or masses Cardiac: RRR; no murmurs, rubs, or gallops,no edema  Respiratory:  clear to auscultation bilaterally, normal work of breathing GI: soft, nontender, nondistended, + BS MS: no deformity or atrophy  Skin: warm and dry, no rash Neuro:  Strength and sensation are intact Psych: euthymic mood, full affect   EKG:  EKG is ordered today. The ekg ordered today demonstrates sinus tachycardia with a heart rate 103 bpm.  No significant ST or T wave changes.   Recent Labs: 07/03/2019: BUN 18; Creatinine, Ser 1.13; Hemoglobin 13.4; Platelets 266; Potassium 5.0; Sodium 137    Lipid Panel    Component Value Date/Time   CHOL 218 (H) 10/28/2016 0819   TRIG 140 10/28/2016 0819   HDL 43 10/28/2016 0819   CHOLHDL 5.1 (H) 10/28/2016 0819   CHOLHDL 3.9 09/15/2015 0803   VLDL 22 09/15/2015 0803   LDLCALC 147 (H)  10/28/2016 0819      Wt Readings from Last 3 Encounters:  07/11/19 (!) 372 lb (168.7 kg)  07/03/19 (!) 372 lb (168.7 kg)  06/05/19 (!) 373 lb 8 oz (169.4 kg)      No flowsheet data found.    ASSESSMENT AND PLAN:  1.  Preop cardiovascular evaluation: The patient has prolonged history of type 1 diabetes.  Functional capacity is reduced due to previous left below the knee amputation.  His symptoms include exertional dyspnea without chest pain and in addition, he has underlying sinus tachycardia which might occasionally indicate underlying myocardial stress.  I recommend evaluation with a Lexiscan Myoview.  In addition, will obtain an echocardiogram to ensure no structural heart abnormalities.  If cardiac testing is unremarkable, the patient can proceed with surgery as planned.  I agree with the addition of metoprolol.  2.  Sinus tachycardia: Report prolonged history of elevated resting heart rate.  There is the possibility of underlying inappropriate sinus tachycardia.  I agree with the addition of Toprol which can be increased if needed.  3.  Essential hypertension: Blood pressure improved with addition of metoprolol.  4.  Hyperlipidemia: I do not see a recent lipid profile but 1 from 2018 showed an LDL of 147.  Given that he is diabetic, I recommend treatment with a statin to achieve an LDL below 70.    Disposition:   FU with me as needed  Signed,  Kathlyn Sacramento, MD  07/11/2019 10:27 AM    [place]

## 2019-07-17 ENCOUNTER — Other Ambulatory Visit: Payer: Self-pay

## 2019-07-17 ENCOUNTER — Encounter
Admission: RE | Admit: 2019-07-17 | Discharge: 2019-07-17 | Disposition: A | Discharge status: Home / Self Care | Payer: BC Managed Care – PPO | Source: Ambulatory Visit | Attending: Cardiovascular Disease | Admitting: Cardiovascular Disease

## 2019-07-17 ENCOUNTER — Other Ambulatory Visit: Payer: Self-pay | Admitting: Physician Assistant

## 2019-07-17 ENCOUNTER — Ambulatory Visit
Admission: RE | Admit: 2019-07-17 | Discharge: 2019-07-17 | Disposition: A | Discharge status: Home / Self Care | Payer: BC Managed Care – PPO | Source: Ambulatory Visit | Attending: Cardiovascular Disease | Admitting: Cardiovascular Disease

## 2019-07-17 DIAGNOSIS — Z01818 Encounter for other preprocedural examination: Secondary | ICD-10-CM | POA: Diagnosis not present

## 2019-07-17 DIAGNOSIS — E1065 Type 1 diabetes mellitus with hyperglycemia: Secondary | ICD-10-CM

## 2019-07-17 DIAGNOSIS — R0602 Shortness of breath: Secondary | ICD-10-CM

## 2019-07-17 LAB — NM MYOCAR MULTI W/SPECT W/WALL MOTION / EF
Estimated workload: 1 METS
Exercise duration (min): 0 min
Exercise duration (sec): 0 s
LV dias vol: 91 mL (ref 62–150)
LV sys vol: 32 mL
MPHR: 178 {beats}/min
Peak HR: 121 {beats}/min
Percent HR: 67 %
Rest HR: 103 {beats}/min
SDS: 0
SRS: 3
SSS: 3
TID: 1.09

## 2019-07-17 MED ORDER — REGADENOSON 0.4 MG/5ML IV SOLN
0.4000 mg | Freq: Once | INTRAVENOUS | Status: AC
  Administered 2019-07-17: 0.4 mg via INTRAVENOUS
  Filled 2019-07-17: qty 5

## 2019-07-17 MED ORDER — TECHNETIUM TC 99M TETROFOSMIN IV KIT
10.0000 | PACK | Freq: Once | INTRAVENOUS | Status: AC | PRN
  Administered 2019-07-17: 11.269 via INTRAVENOUS

## 2019-07-17 MED ORDER — TECHNETIUM TC 99M TETROFOSMIN IV KIT
33.5760 | PACK | Freq: Once | INTRAVENOUS | Status: AC | PRN
  Administered 2019-07-17: 33.576 via INTRAVENOUS

## 2019-07-18 ENCOUNTER — Telehealth: Payer: Self-pay | Admitting: Cardiovascular Disease

## 2019-07-18 NOTE — Telephone Encounter (Signed)
-----   Message from Iran Ouch, MD sent at 07/18/2019  1:30 PM EDT ----- Inform patient that  stress test was normal. He is at low risk for shoulder surgery.

## 2019-07-18 NOTE — Telephone Encounter (Signed)
Patient would like to be called regarding his stress test results  Please advise

## 2019-07-18 NOTE — Telephone Encounter (Signed)
See result note.  

## 2019-07-18 NOTE — Telephone Encounter (Signed)
Patient made aware of stress test results with verbalized understanding.  Patient rqst a copy of the results be sent to his Ortho physician Dr. Ranell Patrick.

## 2019-07-19 DIAGNOSIS — L97909 Non-pressure chronic ulcer of unspecified part of unspecified lower leg with unspecified severity: Secondary | ICD-10-CM | POA: Diagnosis not present

## 2019-07-19 DIAGNOSIS — L97929 Non-pressure chronic ulcer of unspecified part of left lower leg with unspecified severity: Secondary | ICD-10-CM | POA: Diagnosis not present

## 2019-07-19 DIAGNOSIS — Z89612 Acquired absence of left leg above knee: Secondary | ICD-10-CM | POA: Diagnosis not present

## 2019-07-25 NOTE — Progress Notes (Addendum)
NEUROLOGY FOLLOW UP OFFICE NOTE  ROLLIE Hutchinson [id number]  HISTORY OF PRESENT ILLNESS: Michael Hutchinson is a 44 year old right-handed Caucasian man with type 1 diabetes with history of DKA, hypertension, Charcot's joint of foot, left below the knee amputation, hyperlipidemia and GERD who follows up for multiple sclerosis.  UPDATE: Current disease modifying therapy:Copaxone Other current medications:D3 50,000 IU twice weekly (have missed some doses)  Vision:Vision has cleared. Motor: Sometimes notes tremors in either hand, primarily the right.   Sensory: No numbness. Pain: Pain in low back radiating down legs.  Hurts to stand. . Gait: No change Bowel/Bladder: No issues. Fatigue: yes.  He has no injury.  He wakes up 3:30 to 4:00 AM every morning due to bicep tendon rupture.  Sleep study negative for OSA.   Cognition: no Mood: No depression   HISTORY: In October 2016, he developed a right 6th cranial nerve palsy which was thought to be due to his uncontrolled type 1 diabetes.  In March 2017, he developed blurred vision in the left eye. He saw ophthalmologist Dr. Julian Hutchinson who diagnosed optic neuritis. MRI of the brain and orbits with and without contrast from 05/11/2015 revealed "2 T2hyperintense lesions within the left hemisphere adjacent to the posterior horn of the left lateral ventricle ", a nonspecific finding and without abnormal contrast enhancement.  In March 2019, he developed disconjugate gaze and droopy left eyelid. He saw the eye doctor who diagnosed a left 3rd nerve palsy. He was evaluated in the ED at [facility] on 03/28/2017. CTA of the head was negative for aneurysm. MRI of the brain with and without contrast demonstrated multiple T2/flair hyperintense foci involving the periventricular, deep and subcortical white matter, progressed compared to prior MRI from 2017. In-house neurology believed his findings were more likely due to a  diabetic 3rd nerve palsy rather than MS. He was advised to start aspirin 81 mg daily and follow-up with outpatient neurology. He subsequently underwent a work-up for multiple sclerosis. MRI of the cervical spine with and without contrast from 05/04/2017 revealed no cord lesions. He underwent lumbar puncture on 05/19/2017 for CSF analysis which revealed elevated IgG index of 0.79 and 4 oligoclonal bands not present in the corresponding serum. Cell count was 1, elevated protein 157, elevated glucose 100, negative myelin basic protein, ACE 4, Gram stain and culture negative.  Past disease modifying therapy: Tecfidera (GI upset)  Imaging: 05/11/2015 MRI BRAIN W WO:  Two T2 hyperintense lesions within the left hemisphere adjacent to the posterior horn of the left lateral ventricle. The finding is nonspecific but can be seen in the setting of chronic microvascular ischemia, a demyelinating process such as multiple sclerosis, vasculitis, complicated migraine headaches, or as the sequelae of a prior infectious or inflammatory process. 05/11/2015 MRI ORBITS W WO:  Within normal limits 03/28/2017 MRI BRAIN & ORBITS W WO:  1. Multiple T2/FLAIR hyperintense foci involving the periventricular, deep, and subcortical white matter of both cerebral hemispheres in a distribution highly suspicious for possible demyelinating disease/multiple sclerosis. Changes have progressed relative to 2017. No evidence for active demyelination.  2. Negative MRI of the orbits. No findings to suggest acute optic neuritis or other abnormality identified. 3. Bilateral mastoid effusions, left greater than right. 05/04/2017 MRI CERVICAL SPINE W WO:  Normal 02/25/2018 MRI BRAIN W WO: No change since the study of March 2019. Multiple foci of abnormal T2 and FLAIR signal within the cerebral hemispheric deep and subcortical white matter, consistent with the clinical diagnosis of  multiple sclerosis. No new or progressive lesions. No lesions  show contrast enhancement or restricted diffusion.  PAST MEDICAL HISTORY: Past Medical History:  Diagnosis Date  . Charcot's joint of foot 11/25/2013  . Complication of anesthesia    "I wake up angry" (12/31/2015)  . Diabetes mellitus type 1 ([place]) dx'd 1981  . Diabetic ketoacidosis ([place])   . Essential hypertension 05/03/2013  . GERD (gastroesophageal reflux disease)   . High cholesterol   . Hx MRSA infection    Inner thigh and under arm- healed areas  . Meniscus tear   . Shortness of breath    with exertion only    MEDICATIONS: Current Outpatient Medications on File Prior to Visit  Medication Sig Dispense Refill  . aspirin 81 MG chewable tablet Chew 1 tablet (81 mg total) by mouth daily. 30 tablet 0  . BAYER MICROLET LANCETS lancets Use as instructed to check 3 times daily. 100 each 12  . Blood Glucose Monitoring Suppl (ONE TOUCH ULTRA SYSTEM KIT) w/Device KIT 1 kit by Does not apply route once. Check blood sugar 3 times daily. Diagnosis Diabetes ICD-10 E11.8    . Continuous Blood Gluc Receiver ([place]) [place] 1 each by Does not apply route See admin instructions. For use with continuous glucose monitoring system; E11.9 1 each 0  . Continuous Blood Gluc Sensor (DEXCOM G6 SENSOR) MISC APPLY 1 SENSOR AS DIRECTED AND REPLACE EVERY 10 DAYS. 3 each 0  . Continuous Blood Gluc Transmit (DEXCOM G6 TRANSMITTER) MISC FOR USE WITH CONTINUOUS GLUCOSE MONITORING SYSTEM 1 each 0  . Glatiramer Acetate 40 MG/ML SOSY INJECT 40 MG (ONE SYRINGE) UNDER THE SKIN (SUBCUTANEOUS INJECTION) ON MONDAY WEDNESDAY AND FRIDAY (Patient taking differently: Inject 40 mg into the skin 3 (three) times a week. INJECT 40 MG (ONE SYRINGE) UNDER THE SKIN (SUBCUTANEOUS INJECTION) ON MONDAY WEDNESDAY AND FRIDAY) 12 mL 0  . glucose blood (BAYER CONTOUR NEXT TEST) test strip 1 each by Other route 4 (four) times daily. And lancets 4/day 400 each 3  . HYDROcodone-acetaminophen (NORCO/VICODIN) 5-325 MG tablet Take 1  tablet by mouth every 4 (four) hours as needed for severe pain.     Marland Kitchen ibuprofen (ADVIL) 200 MG tablet Take 400-600 mg by mouth every 8 (eight) hours as needed for moderate pain.    . Insulin Disposable Pump (OMNIPOD DASH 5 PACK PODS) MISC 1 each by Does not apply route every 3 (three) days. E10.69 10 each 2  . insulin lispro ([name]) 200 UNIT/ML KwikPen Inject 100 Units into the skin daily. For use in pump; Will provide 30 day supply. (Patient taking differently: Inject 100 Units into the skin See admin instructions. For use in pump; sliding scale) 30 mL 0  . Insulin Syringes, Disposable, U-100 1 ML MISC 3x daily 50 each 2  . losartan (COZAAR) 50 MG tablet 1 po qd (Patient taking differently: Take 50 mg by mouth daily. 1 po qd) 90 tablet 1  . metoprolol succinate (TOPROL-XL) 50 MG 24 hr tablet Take 1 tablet (50 mg total) by mouth daily. Take with or immediately following a meal. 30 tablet 2  . omeprazole (PRILOSEC) 20 MG capsule Take 1 capsule (20 mg total) by mouth daily. 90 capsule 1  . Vitamin D, Ergocalciferol, (DRISDOL) 1.25 MG (50000 UNIT) CAPS capsule 1 tab every Wednesday and Sunday (Patient taking differently: Take 50,000 Units by mouth 2 (two) times a week. 1 tab every Wednesday and Sunday) 24 capsule 6   No current facility-administered  medications on file prior to visit.    ALLERGIES: Allergies  Allergen Reactions  . Influenza Virus Vaccine Split Nausea And Vomiting  . Hydrochlorothiazide Other (See Comments)    Dizziness  . Lisinopril Cough    FAMILY HISTORY: Family History  Problem Relation Age of Onset  . Other Mother   . Cancer Mother        Breast / Bone  . Heart attack Father   . Hypertension Father   . Hyperlipidemia Father   . Diabetes Other   . Alcohol abuse Sister   . Diabetes Maternal Grandfather   . Stroke Paternal Grandmother   . Alcohol abuse Paternal Grandfather     SOCIAL HISTORY: Social History   Socioeconomic History  . Marital status:  Married    Spouse name: Estill Bamberg  . Number of children: 2  . Years of education: Not on file  . Highest education level: High school graduate  Occupational History  . Occupation: Dealer  Tobacco Use  . Smoking status: Never Smoker  . Smokeless tobacco: Former Systems developer    Types: Snuff, Chew  Vaping Use  . Vaping Use: Never used  Substance and Sexual Activity  . Alcohol use: Yes    Alcohol/week: 14.0 standard drinks    Types: 14 Cans of beer per week  . Drug use: No  . Sexual activity: Yes  Other Topics Concern  . Not on file  Social History Narrative   Pt is a right handed married man, lives with his wife and 2 children ages 37 and 27, in a single story house. Pt drinks 6-8, 16oz diet sodas a day.    Social Determinants of Health   Financial Resource Strain:   . Difficulty of Paying Living Expenses:   Food Insecurity:   . Worried About Charity fundraiser in the Last Year:   . Arboriculturist in the Last Year:   Transportation Needs:   . Film/video editor (Medical):   Marland Kitchen Lack of Transportation (Non-Medical):   Physical Activity:   . Days of Exercise per Week:   . Minutes of Exercise per Session:   Stress:   . Feeling of Stress :   Social Connections:   . Frequency of Communication with Friends and Family:   . Frequency of Social Gatherings with Friends and Family:   . Attends Religious Services:   . Active Member of Clubs or Organizations:   . Attends Archivist Meetings:   Marland Kitchen Marital Status:   Intimate Partner Violence:   . Fear of Current or Ex-Partner:   . Emotionally Abused:   Marland Kitchen Physically Abused:   . Sexually Abused:     PHYSICAL EXAM: Blood pressure (!) 178/89, pulse (!) 117, resp. rate 20, height 6' (1.829 m), weight (!) 376 lb (170.6 kg), SpO2 98 %. General: No acute distress.  Patient appears well-groomed.   Head:  Normocephalic/atraumatic Eyes:  Fundi examined but not visualized Neck: supple, no paraspinal tenderness, full range of  motion Heart:  Regular rate and rhythm Lungs:  Clear to auscultation bilaterally Back: No paraspinal tenderness Neurological Exam: alert and oriented to person, place, and time. Attention span and concentration intact, recent and remote memory intact, fund of knowledge intact.  Speech fluent and not dysarthric, language intact.  Slight reduced adduction of left eye.  Otherwise, CN II-XII intact. Bulk and tone normal, muscle strength 5/5 throughout.  Sensation reduced in foot.  Deep tendon reflexes absent throughout.  Finger to nose and  heel to shin testing intact.  Gait normal, Romberg negative.  IMPRESSION: Relapsing-remitting multiple sclerosis.  While ischemic cranial nerve palsies are seen in uncontrolled diabetes, given the 3 distinct episodes of various cranial nerve palsies, the appearance of white matter lesions on brain MRI, and CSF analysis demonstrated for oligoclonal bands not present in serum, I suspect MS. He does have significantly elevated CSF protein but does not exhibit any sign of infection. Diabetes may be contributor to this. Low-back pain, probable lumbar radiculopathy  PLAN: He doesn't feel well overall.  He feels fatigued and weak.  He would like to change DMT. 1.  We discussed options.  He will look into Ocrevus, Zeposia and [place].  He will contact me ASAP with his decision. 2.  In meantime, will check CBC with diff, CMP, hepatitis B panel, IgA/IgG/IgM, VZV. 3.  Continue D3 50,000 IU twice weekly 4.  Since we are changing DMT, will check another MRI of brain with and without contrast to establish new baseline. 5.  Will refer to physical therapy for low back and radicular pain. 6.  Follow up in 6 months.  Metta Clines, DO

## 2019-07-26 ENCOUNTER — Other Ambulatory Visit: Payer: Self-pay

## 2019-07-26 ENCOUNTER — Encounter: Payer: Self-pay | Admitting: Neurology

## 2019-07-26 ENCOUNTER — Ambulatory Visit (INDEPENDENT_AMBULATORY_CARE_PROVIDER_SITE_OTHER): Payer: BC Managed Care – PPO | Admitting: Neurology

## 2019-07-26 ENCOUNTER — Other Ambulatory Visit (INDEPENDENT_AMBULATORY_CARE_PROVIDER_SITE_OTHER): Payer: BC Managed Care – PPO

## 2019-07-26 VITALS — BP 178/89 | HR 117 | Resp 20 | Ht 72.0 in | Wt 376.0 lb

## 2019-07-26 DIAGNOSIS — G35 Multiple sclerosis: Secondary | ICD-10-CM | POA: Diagnosis not present

## 2019-07-26 DIAGNOSIS — M5416 Radiculopathy, lumbar region: Secondary | ICD-10-CM

## 2019-07-26 LAB — CBC WITH DIFFERENTIAL/PLATELET
Basophils Absolute: 0 10*3/uL (ref 0.0–0.1)
Basophils Relative: 0.5 % (ref 0.0–3.0)
Eosinophils Absolute: 0.1 10*3/uL (ref 0.0–0.7)
Eosinophils Relative: 2 % (ref 0.0–5.0)
HCT: 37.1 % — ABNORMAL LOW (ref 39.0–52.0)
Hemoglobin: 12.4 g/dL — ABNORMAL LOW (ref 13.0–17.0)
Lymphocytes Relative: 13.8 % (ref 12.0–46.0)
Lymphs Abs: 0.8 10*3/uL (ref 0.7–4.0)
MCHC: 33.4 g/dL (ref 30.0–36.0)
MCV: 92.9 fl (ref 78.0–100.0)
Monocytes Absolute: 0.6 10*3/uL (ref 0.1–1.0)
Monocytes Relative: 9.8 % (ref 3.0–12.0)
Neutro Abs: 4.2 10*3/uL (ref 1.4–7.7)
Neutrophils Relative %: 73.9 % (ref 43.0–77.0)
Platelets: 210 10*3/uL (ref 150.0–400.0)
RBC: 4 Mil/uL — ABNORMAL LOW (ref 4.22–5.81)
RDW: 13.7 % (ref 11.5–15.5)
WBC: 5.7 10*3/uL (ref 4.0–10.5)

## 2019-07-26 NOTE — Patient Instructions (Addendum)
1.  Continue Copaxone for now.  Continue D3 50,000 IU twice daily 2.  Check CBC with diff, CMP, hepatitis B panel, IgA/IgG/IgM, VZV.   3.  Consider the following MS drugs:  Zeposia, Mavenclad, Ocrevus.  Contact me ASAP with your choice. 4.  Since we are making a change in medication, I want to check a new MRI of brain with and without contrast to establish new baseline. 5.  Refer to physical therapy for low back pain.  1  ZEPOSIA: Ozanimod capsules What is this medicine? OZANIMOD (oh ZAN i mod) helps prevent relapses of multiple sclerosis. The medicine does not cure multiple sclerosis. This medicine may be used for other purposes; ask your health care provider or pharmacist if you have questions. COMMON BRAND NAME(S): ZEPOSIA What should I tell my health care provider before I take this medicine? They need to know if you have any of these conditions:  diabetes  heart disease  high blood pressure  history of irregular heartbeat  history of stroke  immune system problems  infection (especially a virus infection such as chickenpox, cold sores, or herpes)  liver disease  low blood counts, like low white cell, platelet, or red cell counts  lung or breathing disease, like asthma or sleep apnea  skin cancer  recently received or scheduled to receive a vaccine  uveitis  an unusual or allergic reaction to ozanimod, other medicines, foods, dyes, or preservatives  pregnant or trying to get pregnant  breast-feeding How should I use this medicine? Take this medicine by mouth with a glass of water. Follow the directions on the prescription label. Do not cut, crush or chew this medicine. Swallow the capsules whole. You can take it with or without food. If it upsets your stomach, take it with food. Take your medicine at regular intervals. Do not take it more often than directed. Do not stop taking except on your doctor's advice. A special MedGuide will be given to you by the  pharmacist with each prescription and refill. Be sure to read this information carefully each time. Talk to your pediatrician regarding the use of this medicine in children. Special care may be needed. Overdosage: If you think you have taken too much of this medicine contact a poison control center or emergency room at once. NOTE: This medicine is only for you. Do not share this medicine with others. What if I miss a dose? If you miss 1 or more doses during the first 14 days of treatment, talk to your healthcare provider. You will need to begin another starter pack. If you miss a dose after the first 14 days of treatment, skip it. Take your next dose at the normal time. Do not take extra or 2 doses at the same time to make up for the missed dose. What may interact with this medicine? Do not take this medicine with any of the following medications:  linezolid  MAOIs like Azilect, Carbex, Eldepryl, Marplan, Nardil, Parnate, and Xadago This medicine may also interact with the following medications:  alemtuzumab  certain medicines for blood pressure, heart disease, irregular heart beat  certain medicines for depression, anxiety  clopidogrel  cyclosporine  eltrombopag  gemfibrozil  live virus vaccines  medicines that lower your chance of fighting infection  other medicines that prolong the QT interval (abnormal heart rhythm)  narcotic medicines for pain  phenylephrine  pseudoephedrine  rifampin  stimulant medicines for attention disorders, weight loss, or staying awake  tryptophan  tyramine This list  may not describe all possible interactions. Give your health care provider a list of all the medicines, herbs, non-prescription drugs, or dietary supplements you use. Also tell them if you smoke, drink alcohol, or use illegal drugs. Some items may interact with your medicine. What should I watch for while using this medicine? Visit your health care professional for regular  checks on your progress. Tell your health care professional if your symptoms do not start to get better or if they get worse. You may need blood work done while you are taking this medicine. Foods that contain very high amounts of tyramine, such as aged, fermented, cured, smoked and pickled foods, should be avoided while taking this medicine. The combination may cause a dangerous rise in blood pressure. Ask your doctor or health care professional, pharmacist, or nutritionist for a complete listing of foods and beverages that are high in tyramine. If you consume a food or beverage very rich in tyramine and do not feel well soon after eating, contact your health care provider. Tell your doctor or health care professional right away if you have any change in your eyesight. Talk with your doctor if you have not had chickenpox or the vaccine for chickenpox. This medicine can make you more sensitive to the sun. Keep out of the sun. If you cannot avoid being in the sun, wear protective clothing and sunscreen. Do not use sun lamps or tanning beds/booths. Women should inform their doctor if they wish to become pregnant or think they might be pregnant. There is a potential for serious side effects to an unborn child. If you are a male who can become pregnant, you should use effective birth control during your treatment with this medicine and for at least 3 months after you stop taking it. Talk to your health care professional or pharmacist for more information. If you stop taking this medicine, your MS symptoms may get worse. You may have more weakness, trouble using your arms or legs, or changes in balance. Talk to your healthcare provider right away if your symptoms get worse. What side effects may I notice from receiving this medicine? Side effects that you should report to your doctor or health care professional as soon as possible:  allergic reactions like skin rash, itching or hives, swelling of the face,  lips, or tongue  breathing problems  changes in vision  confusion  headache with fever, neck stiffness, sensitivity to light, nausea, or confusion  high blood pressure  loss of memory  problems with balance, talking, or walking  signs and symptoms of a dangerous change in heartbeat or heart rhythm like chest pain; dizziness; fast or irregular heartbeat; palpitations; feeling faint or lightheaded, falls; breathing problems  signs and symptoms of infection like fever or chills; cough; sore throat; pain or trouble passing urine  signs and symptoms of liver injury like dark yellow or brown urine; general ill feeling or flu-like symptoms; light-colored stools; loss of appetite; nausea; right upper belly pain; unusually weak or tired; yellowing of the eyes or skin  change in your skin's appearance (color, change in a mole or freckle, new growth)  seizures  sudden numbness or weakness of the face, arm or leg  unusually slow heartbeat Side effects that usually do not require medical attention (report to your doctor or health care professional if they continue or are bothersome):  back pain  dizziness  sinus trouble This list may not describe all possible side effects. Call your doctor for medical advice  about side effects. You may report side effects to FDA at 1-800-FDA-1088. Where should I keep my medicine? Keep out of the reach of children. Store at room temperature between 20 and 25 degrees C (68 and 77 degrees F). Throw away any unused medicine after the expiration date. NOTE: This sheet is a summary. It may not cover all possible information. If you have questions about this medicine, talk to your doctor, pharmacist, or health care provider.  2020 Elsevier/Gold Standard (2018-06-12 14:27:29)   2  MAVENCLAD: Cladribine tablets What is this medicine? CLADRIBINE (KLA dri been) may help prevent relapses of multiple sclerosis. This medicine is not a cure. This medicine may be  used for other purposes; ask your health care provider or pharmacist if you have questions. COMMON BRAND NAME(S): MAVENCLAD What should I tell my health care provider before I take this medicine? They need to know if you have any of these conditions:  cancer  HIV or AIDS  immune system problems  infection (especially a viral infection such as chickenpox, cold sores, or herpes)  kidney disease  liver disease  low blood counts, like white cells, platelets, or red blood cells  recently received or scheduled to receive a vaccine  an unusual or allergic reaction to cladribine, other medicines, foods, dyes, or preservatives  pregnant or trying to get pregnant  breast-feeding How should I use this medicine? Take this medicine by mouth with a glass of water. Follow the directions on the prescription label. You can take it with or without food. If it upsets your stomach, take it with food. Take your medicine at regular intervals. Do not take it more often than directed. Do not stop taking except on your doctor's advice. A special MedGuide will be given to you by the pharmacist with each prescription and refill. Be sure to read this information carefully each time. Talk to your pediatrician about the use of this medicine in children. Special care may be needed. Overdosage: If you think you have taken too much of this medicine contact a poison control center or emergency room at once. NOTE: This medicine is only for you. Do not share this medicine with others. What if I miss a dose? It is important not to miss any doses. Talk to your healthcare professional about what to do if you miss a dose. What may interact with this medicine? Do not take this medicine with any of the following medications:  live virus vaccines This medicine may also interact with the following medications:  birth control pills  certain antivirals for HIV or  hepatitis  cilostazol  curcumin  cyclosporine  dilazep  dipyridamole  eltrombopag  medicines that lower your chance of fighting infection  nifedipine  nimodipine  reserpine  ribavirin  rifampicin  St. John's Wort  steroid medicines like prednisone or cortisone  sulindac This list may not describe all possible interactions. Give your health care provider a list of all the medicines, herbs, non-prescription drugs, or dietary supplements you use. Also tell them if you smoke, drink alcohol, or use illegal drugs. Some items may interact with your medicine. What should I watch for while using this medicine? Visit your healthcare professional for regular checks on your progress. Tell your healthcare professional if your symptoms do not start to get better or if they get worse. You may need blood work done while you are taking this medicine. This medicine may increase your risk of getting an infection. Call your healthcare professional for advice if  you get a fever, chills, or sore throat, or other symptoms of a cold or flu. Do not treat yourself. Try to avoid being around people who are sick. Call your healthcare professional if you are around anyone with measles, chickenpox, or if you develop blisters that do not heal properly. In some patients, this medicine may cause a serious brain infection that may cause death. If you have any problems seeing, thinking, speaking, walking, or standing, tell your healthcare professional right away. If you cannot reach your healthcare professional, urgently seek other source of medical care. Talk with your doctor if you have not had chickenpox or the vaccine for chickenpox. This medicine can decrease the response to a vaccine. If you need to get vaccinated, tell your healthcare professional if you have received this medicine within the last 4 weeks. Extra booster doses may be needed. Talk to your healthcare professional to see if a different  vaccination schedule is needed. Talk to your healthcare professional about your risk of cancer. You may be more at risk for certain types of cancer if you take this medicine. Do not become pregnant while taking this medicine or for 6 months after stopping it. Women should inform their healthcare professional if they wish to become pregnant or think they might be pregnant. Men should not father a child while taking this medicine and for 6 months after stopping it. There is potential for serious side effects to an unborn child. Talk to your healthcare professional for more information. What side effects may I notice from receiving this medicine? Side effects that you should report to your doctor or health care professional as soon as possible:  allergic reactions like skin rash, itching or hives, swelling of the face, lips, or tongue  changes in vision  confusion  loss of balance or coordination  loss of memory  signs and symptoms of infection like fever or chills; cough; sore throat; pain or trouble passing urine  signs and symptoms of liver injury like dark yellow or brown urine; general ill feeling or flu-like symptoms; light-colored stools; loss of appetite; nausea; right upper belly pain; unusually weak or tired; yellowing of the eyes or skin Side effects that usually do not require medical attention (report to your doctor or health care professional if they continue or are bothersome):  back pain  hair loss  headache  joint pain  nausea  trouble sleeping This list may not describe all possible side effects. Call your doctor for medical advice about side effects. You may report side effects to FDA at 1-800-FDA-1088. Where should I keep my medicine? Keep out of the reach of children. Store between 20 and 25 degrees C (68 and 77 degrees F). Keep this medicine in the original container. Throw away any unused medicine after the expiration date. NOTE: This sheet is a summary. It may  not cover all possible information. If you have questions about this medicine, talk to your doctor, pharmacist, or health care provider.  2020 Elsevier/Gold Standard (2017-04-18 01:52:02)  3  OCREVUS: Ocrelizumab injection What is this medicine? OCRELIZUMAB (ok re LIZ ue mab) treats multiple sclerosis. It helps to decrease the number of multiple sclerosis relapses. It is not a cure. This medicine may be used for other purposes; ask your health care provider or pharmacist if you have questions. COMMON BRAND NAME(S): OCREVUS What should I tell my health care provider before I take this medicine? They need to know if you have any of these conditions:  cancer  hepatitis B infection  other infection (especially a virus infection such as chickenpox, cold sores, or herpes)  an unusual or allergic reaction to ocrelizumab, other medicines, foods, dyes or preservatives  pregnant or trying to get pregnant  breast-feeding How should I use this medicine? This medicine is for infusion into a vein. It is given by a health care professional in a hospital or clinic setting. A special MedGuide will be given to you before each treatment. Be sure to read this information carefully each time. Talk to your pediatrician regarding the use of this medicine in children. Special care may be needed. Overdosage: If you think you have taken too much of this medicine contact a poison control center or emergency room at once. NOTE: This medicine is only for you. Do not share this medicine with others. What if I miss a dose? Keep appointments for follow-up doses as directed. It is important not to miss your dose. Call your doctor or health care professional if you are unable to keep an appointment. What may interact with this medicine?  alemtuzumab  daclizumab  dimethyl fumarate  fingolimod  glatiramer  interferon beta  live virus vaccines  mitoxantrone  natalizumab  peginterferon  beta  rituximab  steroid medicines like prednisone or cortisone  teriflunomide This list may not describe all possible interactions. Give your health care provider a list of all the medicines, herbs, non-prescription drugs, or dietary supplements you use. Also tell them if you smoke, drink alcohol, or use illegal drugs. Some items may interact with your medicine. What should I watch for while using this medicine? Tell your doctor or healthcare professional if your symptoms do not start to get better or if they get worse. This medicine can cause serious allergic reactions. To reduce your risk you may need to take medicine before treatment with this medicine. Take your medicine as directed. Women should inform their doctor if they wish to become pregnant or think they might be pregnant. There is a potential for serious side effects to an unborn child. Talk to your health care professional or pharmacist for more information. Male patients should use effective birth control methods while receiving this medicine and for 6 months after the last dose. Call your doctor or health care professional for advice if you get a fever, chills or sore throat, or other symptoms of a cold or flu. Do not treat yourself. This drug decreases your body's ability to fight infections. Try to avoid being around people who are sick. If you have a hepatitis B infection or a history of a hepatitis B infection, talk to your doctor. The symptoms of hepatitis B may get worse if you take this medicine. In some patients, this medicine may cause a serious brain infection that may cause death. If you have any problems seeing, thinking, speaking, walking, or standing, tell your doctor right away. If you cannot reach your doctor, urgently seek other source of medical care. This medicine can decrease the response to a vaccine. If you need to get vaccinated, tell your healthcare professional if you have received this medicine. Extra  booster doses may be needed. Talk to your doctor to see if a different vaccination schedule is needed. Talk to your doctor about your risk of cancer. You may be more at risk for certain types of cancers if you take this medicine. What side effects may I notice from receiving this medicine? Side effects that you should report to your doctor or health care professional as  soon as possible:  allergic reactions like skin rash, itching or hives, swelling of the face, lips, or tongue  breathing problems  facial flushing  fast, irregular heartbeat  lump or soreness in the breast  signs and symptoms of herpes such as cold sore, shingles, or genital sores  signs and symptoms of infection like fever or chills, cough, sore throat, pain or trouble passing urine  signs and symptoms of low blood pressure like dizziness; feeling faint or lightheaded, falls; unusually weak or tired  signs and symptoms of progressive multifocal leukoencephalopathy (PML) like changes in vision; clumsiness; confusion; personality changes; weakness on one side of the body  swelling of the ankles, feet, hands Side effects that usually do not require medical attention (report these to your doctor or health care professional if they continue or are bothersome):  back pain  depressed mood  diarrhea  pain, redness, or irritation at site where injected This list may not describe all possible side effects. Call your doctor for medical advice about side effects. You may report side effects to FDA at 1-800-FDA-1088. Where should I keep my medicine? This drug is given in a hospital or clinic and will not be stored at home. NOTE: This sheet is a summary. It may not cover all possible information. If you have questions about this medicine, talk to your doctor, pharmacist, or health care provider.  2020 Elsevier/Gold Standard (2018-01-01 07:41:53)   Your provider has requested that you have labwork completed today. Please go  to [facility] ([id number]) on the second floor of this building before leaving the office today. You do not need to check in. If you are not called within 15 minutes please check with the front desk. We have sent a referral to [facility] for your MRI and they will call you directly to schedule your appointment. They are located at [address] [facility]. If you need to contact them directly please call [phone].

## 2019-07-27 LAB — IGG, IGA, IGM
IgG (Immunoglobin G), Serum: 1094 mg/dL (ref 600–1640)
IgM, Serum: 64 mg/dL (ref 50–300)
Immunoglobulin A: 313 mg/dL — ABNORMAL HIGH (ref 47–310)

## 2019-07-29 ENCOUNTER — Telehealth: Payer: Self-pay | Admitting: Neurology

## 2019-07-29 LAB — ACUTE HEP PANEL AND HEP B SURFACE AB
HEPATITIS C ANTIBODY REFILL$(REFL): NONREACTIVE
Hep A IgM: NONREACTIVE
Hep B C IgM: NONREACTIVE
Hepatitis B Surface Ag: NONREACTIVE
SIGNAL TO CUT-OFF: 0.01 (ref <1.00)

## 2019-07-29 LAB — REFLEX TIQ

## 2019-07-29 NOTE — Telephone Encounter (Signed)
Labs look okay.  We can proceed with setting up for Ocrevus

## 2019-07-29 NOTE — Telephone Encounter (Signed)
We discussed 3 MS drugs:  Ocrevus, Zeposia and Mavenclad.  Has he decided on one?

## 2019-07-29 NOTE — Telephone Encounter (Signed)
Patient called in to see if the results from his blood work have come in from last week.

## 2019-07-29 NOTE — Telephone Encounter (Signed)
Can you review and advise when you get a chance on Labs.

## 2019-07-29 NOTE — Telephone Encounter (Signed)
Patient wants to start Home infusion for Ocrevus. Please advise on directions. Thanks

## 2019-07-30 LAB — VARICELLA ZOSTER ABS, IGG/IGM
Varicella IgM: <0.91 index (ref 0.00–0.90)
Varicella zoster IgG: 3363 index (ref >165)

## 2019-07-30 NOTE — Telephone Encounter (Signed)
There is a start form that needs to be filled out.

## 2019-08-01 DIAGNOSIS — M25511 Pain in right shoulder: Secondary | ICD-10-CM | POA: Diagnosis not present

## 2019-08-01 DIAGNOSIS — M25521 Pain in right elbow: Secondary | ICD-10-CM | POA: Diagnosis not present

## 2019-08-02 ENCOUNTER — Other Ambulatory Visit: Payer: Self-pay | Admitting: Neurology

## 2019-08-02 DIAGNOSIS — L97822 Non-pressure chronic ulcer of other part of left lower leg with fat layer exposed: Secondary | ICD-10-CM | POA: Diagnosis not present

## 2019-08-02 DIAGNOSIS — M5416 Radiculopathy, lumbar region: Secondary | ICD-10-CM

## 2019-08-02 DIAGNOSIS — G35 Multiple sclerosis: Secondary | ICD-10-CM

## 2019-08-02 DIAGNOSIS — Z89512 Acquired absence of left leg below knee: Secondary | ICD-10-CM | POA: Diagnosis not present

## 2019-08-05 ENCOUNTER — Ambulatory Visit: Payer: BC Managed Care – PPO | Admitting: Cardiology

## 2019-08-07 DIAGNOSIS — M542 Cervicalgia: Secondary | ICD-10-CM | POA: Insufficient documentation

## 2019-08-08 ENCOUNTER — Ambulatory Visit: Payer: BC Managed Care – PPO | Attending: Neurology

## 2019-08-10 DIAGNOSIS — E1140 Type 2 diabetes mellitus with diabetic neuropathy, unspecified: Secondary | ICD-10-CM | POA: Diagnosis not present

## 2019-08-10 DIAGNOSIS — G5621 Lesion of ulnar nerve, right upper limb: Secondary | ICD-10-CM | POA: Diagnosis not present

## 2019-08-10 DIAGNOSIS — G5601 Carpal tunnel syndrome, right upper limb: Secondary | ICD-10-CM | POA: Diagnosis not present

## 2019-08-12 ENCOUNTER — Other Ambulatory Visit: Payer: Self-pay | Admitting: Physician Assistant

## 2019-08-12 DIAGNOSIS — E1065 Type 1 diabetes mellitus with hyperglycemia: Secondary | ICD-10-CM

## 2019-08-12 DIAGNOSIS — M542 Cervicalgia: Secondary | ICD-10-CM | POA: Diagnosis not present

## 2019-08-16 ENCOUNTER — Other Ambulatory Visit: Payer: BC Managed Care – PPO

## 2019-08-16 DIAGNOSIS — Z89612 Acquired absence of left leg above knee: Secondary | ICD-10-CM | POA: Diagnosis not present

## 2019-08-16 DIAGNOSIS — L97909 Non-pressure chronic ulcer of unspecified part of unspecified lower leg with unspecified severity: Secondary | ICD-10-CM | POA: Diagnosis not present

## 2019-08-16 DIAGNOSIS — Z471 Aftercare following joint replacement surgery: Secondary | ICD-10-CM | POA: Diagnosis not present

## 2019-08-16 DIAGNOSIS — L97929 Non-pressure chronic ulcer of unspecified part of left lower leg with unspecified severity: Secondary | ICD-10-CM | POA: Diagnosis not present

## 2019-08-22 DIAGNOSIS — Z89512 Acquired absence of left leg below knee: Secondary | ICD-10-CM | POA: Diagnosis not present

## 2019-08-23 DIAGNOSIS — M25521 Pain in right elbow: Secondary | ICD-10-CM | POA: Diagnosis not present

## 2019-08-23 DIAGNOSIS — M25511 Pain in right shoulder: Secondary | ICD-10-CM | POA: Diagnosis not present

## 2019-08-26 ENCOUNTER — Other Ambulatory Visit: Payer: Self-pay | Admitting: Neurology

## 2019-08-26 DIAGNOSIS — M5416 Radiculopathy, lumbar region: Secondary | ICD-10-CM

## 2019-08-26 DIAGNOSIS — G35 Multiple sclerosis: Secondary | ICD-10-CM

## 2019-08-30 NOTE — Telephone Encounter (Signed)
This is already is progress with [employer]. Waiting on pt insurance company. I think I already spoke to the pt about this before I started the [employer] process. Last month.

## 2019-08-30 NOTE — Telephone Encounter (Signed)
Please see Dr.jaffe's Note to start Ocrevus. See me on this. thanks

## 2019-09-02 ENCOUNTER — Telehealth: Payer: Self-pay | Admitting: Physician Assistant

## 2019-09-02 DIAGNOSIS — E079 Disorder of thyroid, unspecified: Secondary | ICD-10-CM

## 2019-09-02 NOTE — Telephone Encounter (Signed)
Please advise if you would like additional labs-etc?

## 2019-09-02 NOTE — Telephone Encounter (Signed)
TSH, Free T4, T3  Thyroid ultrasound  Get copy of MRI done at [facility]

## 2019-09-02 NOTE — Telephone Encounter (Signed)
Patient called saying his ortho provider at [facility] did an MRI of his neck for his ongoing neck pain. They happened to notice some irregularities with his thyroid and he was advised to sch an appt with his PCP. We did not have an appt until 9/24 which he scheduled. He wants to speak with nurse about the findings and possibly doing some additional labs/imaging prior to OV. Please advise.

## 2019-09-03 ENCOUNTER — Telehealth: Payer: Self-pay | Admitting: Nutrition

## 2019-09-03 NOTE — Telephone Encounter (Signed)
[place] 3 pods fell of due to heat/sweating.  Requested a few pods and a vial of U-200 Humalog, because he will be short, before end of month.  Told him that I have the pods but no insulin, and he requested U-100 vial, and will double his basal rate and ISF, and 1/2 his carb ratio.

## 2019-09-03 NOTE — Addendum Note (Signed)
Addended by: Sylvester Harder on: 09/03/2019 08:47 AM   Modules accepted: Orders

## 2019-09-03 NOTE — Telephone Encounter (Signed)
Labs and [place] ordered per [place]. Patient is aware and has been schedule for lab apt. AS, CMA

## 2019-09-04 ENCOUNTER — Encounter: Payer: Self-pay | Admitting: Neurology

## 2019-09-04 NOTE — Progress Notes (Signed)
Received fax from [facility] with approval for patient's Ocrevus 300mg - effective 09/02/19 to 08/31/20. 1200 Units approved.   Sent paperwork to scanning.

## 2019-09-05 ENCOUNTER — Telehealth: Payer: Self-pay | Admitting: Neurology

## 2019-09-05 ENCOUNTER — Other Ambulatory Visit: Payer: Self-pay | Admitting: Neurology

## 2019-09-05 MED ORDER — DIAZEPAM 5 MG PO TABS: ORAL_TABLET | ORAL | 0 refills | Status: DC

## 2019-09-05 NOTE — Telephone Encounter (Signed)
Done.  Will need driver to and from the MRI facility as it causes drowsiness.

## 2019-09-05 NOTE — Telephone Encounter (Signed)
Patient called and requested "something to help with anxiety during the test" related to having his MRI this Saturday, 09/07/19.  [facility] in [place]

## 2019-09-06 ENCOUNTER — Other Ambulatory Visit: Payer: Self-pay

## 2019-09-06 ENCOUNTER — Other Ambulatory Visit: Payer: BC Managed Care – PPO

## 2019-09-06 DIAGNOSIS — E079 Disorder of thyroid, unspecified: Secondary | ICD-10-CM | POA: Diagnosis not present

## 2019-09-06 NOTE — Telephone Encounter (Signed)
LMOVM for pt 

## 2019-09-07 ENCOUNTER — Other Ambulatory Visit: Payer: BC Managed Care – PPO

## 2019-09-07 LAB — T3: T3, Total: 107 ng/dL (ref 71–180)

## 2019-09-07 LAB — TSH: TSH: 1.6 u[IU]/mL (ref 0.450–4.500)

## 2019-09-07 LAB — T4, FREE: Free T4: 1.37 ng/dL (ref 0.82–1.77)

## 2019-09-09 DIAGNOSIS — G5601 Carpal tunnel syndrome, right upper limb: Secondary | ICD-10-CM | POA: Diagnosis not present

## 2019-09-09 DIAGNOSIS — G5610 Other lesions of median nerve, unspecified upper limb: Secondary | ICD-10-CM | POA: Insufficient documentation

## 2019-09-09 DIAGNOSIS — G5611 Other lesions of median nerve, right upper limb: Secondary | ICD-10-CM | POA: Diagnosis not present

## 2019-09-09 DIAGNOSIS — G5621 Lesion of ulnar nerve, right upper limb: Secondary | ICD-10-CM | POA: Diagnosis not present

## 2019-09-09 DIAGNOSIS — G5620 Lesion of ulnar nerve, unspecified upper limb: Secondary | ICD-10-CM | POA: Insufficient documentation

## 2019-09-09 DIAGNOSIS — M25521 Pain in right elbow: Secondary | ICD-10-CM | POA: Diagnosis not present

## 2019-09-10 ENCOUNTER — Other Ambulatory Visit: Payer: BC Managed Care – PPO

## 2019-09-12 ENCOUNTER — Other Ambulatory Visit: Payer: BC Managed Care – PPO

## 2019-09-13 ENCOUNTER — Other Ambulatory Visit: Payer: BC Managed Care – PPO

## 2019-09-27 ENCOUNTER — Other Ambulatory Visit: Payer: BC Managed Care – PPO

## 2019-10-01 ENCOUNTER — Telehealth (INDEPENDENT_AMBULATORY_CARE_PROVIDER_SITE_OTHER): Payer: BC Managed Care – PPO | Admitting: Medical-Surgical

## 2019-10-01 ENCOUNTER — Other Ambulatory Visit: Payer: BC Managed Care – PPO

## 2019-10-01 ENCOUNTER — Other Ambulatory Visit: Payer: Self-pay

## 2019-10-01 ENCOUNTER — Encounter: Payer: Self-pay | Admitting: Medical-Surgical

## 2019-10-01 DIAGNOSIS — J069 Acute upper respiratory infection, unspecified: Secondary | ICD-10-CM

## 2019-10-01 DIAGNOSIS — Z20822 Contact with and (suspected) exposure to covid-19: Secondary | ICD-10-CM

## 2019-10-01 MED ORDER — AMOXICILLIN-POT CLAVULANATE 875-125 MG PO TABS: 1.0000 | ORAL_TABLET | Freq: Two times a day (BID) | ORAL | 0 refills | Status: AC

## 2019-10-01 NOTE — Progress Notes (Signed)
Virtual Visit via Video Note  I connected with Michael Hutchinson on 10/01/19 at  9:10 AM EDT by a video enabled telemedicine application and verified that I am speaking with the correct person using two identifiers.   I discussed the limitations of evaluation and management by telemedicine and the availability of in person appointments. The patient expressed understanding and agreed to proceed.  Patient location: home Provider locations: office  Subjective:    CC: URI symptoms  HPI: Pleasant 44 year old male presenting initially via MyChart video visit but we had significant connectivity issues. Visit switched to telephone after several minutes of trying to work on troubleshooting. Patient reports ~5 days of upper respiratory symptoms including rhinorrhea, sinus congestion, cough productive of thick white/yellow mucus, and sore throat. Taking a deep breath causes significant coughing spells. Denies fever, chills, chest pain, shortness of breath, nausea, vomiting, and diarrhea. Has been taking Alka Seltzer Cough/Cold and Mucinex DM, minimally helpful. Unfortunately, his sugars have been running high from the OTC medications. Reports he had the Phizer COVID vaccines in January.  Past medical history, Surgical history, Family history not pertinant except as noted below, Social history, Allergies, and medications have been entered into the medical record, reviewed, and corrections made.   Review of Systems: See HPI for pertinent positives and negatives.   Objective:    General: Speaking clearly in complete sentences without any shortness of breath.  Alert and oriented x3.  Normal judgment. No apparent acute distress.  Impression and Recommendations:    1. Upper respiratory tract infection, unspecified type Recommend COVID testing. Will go ahead and treat empirically with Augmentin BID x 7 days with the patients multiple comorbidities. Increase PO fluids. Rest when possible. Ok to continue OTC  cold/cough medications but try to steer clear of ones that will increase glucose and blood pressure. Advised to look for cold/flu medications formulated specifically for diabetic patients as these are available in [facility] pharmacies.   Return if symptoms worsen or fail to improve.  20 minutes of non face-to-face time was provided during this encounter.  I discussed the assessment and treatment plan with the patient. The patient was provided an opportunity to ask questions and all were answered. The patient agreed with the plan and demonstrated an understanding of the instructions.   The patient was advised to call back or seek an in-person evaluation if the symptoms worsen or if the condition fails to improve as anticipated.  Thayer Ohm, DNP, APRN, FNP-BC [facility]

## 2019-10-01 NOTE — Addendum Note (Signed)
Addended byChristen Butter on: 10/01/2019 03:23 PM   Modules accepted: Orders

## 2019-10-02 ENCOUNTER — Other Ambulatory Visit: Payer: BC Managed Care – PPO

## 2019-10-03 ENCOUNTER — Other Ambulatory Visit: Payer: BC Managed Care – PPO

## 2019-10-03 LAB — NOVEL CORONAVIRUS, NAA: SARS-CoV-2, NAA: DETECTED — AB

## 2019-10-03 LAB — SARS-COV-2, NAA 2 DAY TAT

## 2019-10-04 ENCOUNTER — Other Ambulatory Visit: Payer: Self-pay

## 2019-10-04 ENCOUNTER — Other Ambulatory Visit: Payer: BC Managed Care – PPO

## 2019-10-04 ENCOUNTER — Encounter: Payer: Self-pay | Admitting: Physician Assistant

## 2019-10-04 ENCOUNTER — Ambulatory Visit (INDEPENDENT_AMBULATORY_CARE_PROVIDER_SITE_OTHER): Payer: BC Managed Care – PPO | Admitting: Physician Assistant

## 2019-10-04 VITALS — Ht 72.0 in | Wt 370.0 lb

## 2019-10-04 DIAGNOSIS — Z89512 Acquired absence of left leg below knee: Secondary | ICD-10-CM

## 2019-10-04 DIAGNOSIS — E1069 Type 1 diabetes mellitus with other specified complication: Secondary | ICD-10-CM

## 2019-10-04 DIAGNOSIS — E1159 Type 2 diabetes mellitus with other circulatory complications: Secondary | ICD-10-CM | POA: Diagnosis not present

## 2019-10-04 DIAGNOSIS — U071 COVID-19: Secondary | ICD-10-CM

## 2019-10-04 DIAGNOSIS — E1065 Type 1 diabetes mellitus with hyperglycemia: Secondary | ICD-10-CM | POA: Diagnosis not present

## 2019-10-04 DIAGNOSIS — E782 Mixed hyperlipidemia: Secondary | ICD-10-CM

## 2019-10-04 DIAGNOSIS — G35 Multiple sclerosis: Secondary | ICD-10-CM | POA: Diagnosis not present

## 2019-10-04 DIAGNOSIS — I152 Hypertension secondary to endocrine disorders: Secondary | ICD-10-CM

## 2019-10-04 DIAGNOSIS — R9389 Abnormal findings on diagnostic imaging of other specified body structures: Secondary | ICD-10-CM

## 2019-10-04 DIAGNOSIS — I10 Essential (primary) hypertension: Secondary | ICD-10-CM

## 2019-10-04 MED ORDER — DEXCOM G6 SENSOR MISC: 0 refills | Status: DC

## 2019-10-04 NOTE — Progress Notes (Signed)
Telehealth office visit note for Michael Reid, PA-C- at Primary Care at [facility]   I connected with current patient today by telephone and verified that I am speaking with the correct person   . Location of the patient: Home . Location of the provider: Office - This visit type was conducted due to national recommendations for restrictions regarding the COVID-19 Pandemic (e.g. social distancing) in an effort to limit this patient's exposure and mitigate transmission in our community.    - No physical exam could be performed with this format, beyond that communicated to [place] by the patient/ family members as noted.   - Additionally my office staff/ schedulers were to discuss with the patient that there may be a monetary charge related to this service, depending on their medical insurance.  My understanding is that patient understood and consented to proceed.     _________________________________________________________________________________   History of Present Illness: Pt calls in to follow up on hypertension, diabetes mellitus, and thyroid disorder. Pt is recovering from Covid-19 infection. Reports a mild cough and runny nose. Symptoms started Wednesday of last week. Patient had video visit with Samuel Bouche, NP and was given an antibiotic which he feels helped improve his symptoms. Denies dyspnea or fever.  Diabetes mellitus type 1: Pt denies increased urination or thirst. Pt reports medication compliance. No hypoglycemic events. Has dexcom sensor/device and FBS range 185-250. States his sugar spikes at night. Still in the process of establishing with a new endocrinologist. Requesting refill of dexcom sensors.  HTN: Pt denies new onset of chest pain, palpitations, dizziness or lower extremity swelling. Taking medication as directed without side effects. Reports difficult to get accurate BP readings due to finding an appropriate size of BP cuff. On average BP range 132/86-90 and pulse  has improved once started on beta blocker. On average pulse 108-110 and asymptomatic. States he usually runs in higher range.  Thyroid: Patient had MRI per his orthopedist which revealed some abnormalities of thyroid. Labs were ordered and were wnl. Pt reports fatigue and inability to loose weight, which he thought could possibly be related to thyroid.     GAD 7 : Generalized Anxiety Score 06/05/2018  Nervous, Anxious, on Edge 0  Control/stop worrying 0  Worry too much - different things 0  Trouble relaxing 0  Restless 0  Easily annoyed or irritable 0  Afraid - awful might happen 0  Total GAD 7 Score 0    Depression screen [facility] 2/9 10/04/2019 06/05/2018 11/22/2017 10/09/2017 08/08/2017  Decreased Interest 0 0 1 1 0  Down, Depressed, Hopeless 0 0 0 0 0  PHQ - 2 Score 0 0 1 1 0  Altered sleeping 1 2 0 0 0  Tired, decreased energy 1 0 _0 Change in appetite 0 0 0 0 1  Feeling bad or failure about yourself  0 0 0 0 0  Trouble concentrating 0 0 1 0 0  Moving slowly or fidgety/restless 0 0 0 0 0  Suicidal thoughts 0 0 0 0 0  PHQ-9 Score _1 Difficult doing work/chores Not difficult at all - - Not difficult at all Not difficult at all  Some recent data might be hidden      Impression and Recommendations:     1. Type 1 diabetes mellitus with hyperglycemia (HCC)   2. Hypertension associated with diabetes ([place])   3. Obesity, Class III, BMI 40-49.9 (morbid obesity) ([place])  4. COVID-19 virus infection   5. Abnormal imaging of thyroid   6. Hx of BKA, left - with subsequent cellulitis and treatment for osteomylitis   7. Mixed diabetic hyperlipidemia associated with type 1 diabetes mellitus (HCC)     Type 1 DM with hyperglycemia: -Followed by endocrinology. Pt is in the process of establishing with new endocrinologist. -Continue current medication regimen. -Recommend to monitor carbohydrates and glucose.  Hypertension associated with diabetes: -Discussed with patient BP goal  is <130/80 and pulse <100 so recommend to purchase appropriate cuff size and can probably find one on amazon. Pt is also recovering from Covid-19 which can possibly be contributing to tachycardia. -Advised to continue ambulatory monitoring and if BP and pulse consistently above goal then should notify the clinic and will make medication adjustments (increase Metoprolol).  -Follow a low sodium diet. -Will continue to monitor.  Abnormal imaging of thyroid gland: -Recent thyroid labs were wnl -A copy of MRI report has been requested. -Discussed with patient will repeat TSH to confirm no thyroid disorder. -An order for thyroid ultrasound was placed and cancelled.  Hyperlipidemia associated with type 1 diabetes mellitus: -Advised patient to schedule lab visit for FBW to repeat lipid panel w/in next few weeks. -Pt has history of statin intolerance. -Follow a heart healthy diet low in saturated and trans fats. -Will continue to monitor.  Covid-19 infection: -Pt recovering well and has no concerning signs or symptoms. Pt aware of red flag signs and symptoms to monitor for and seek immediate medical care. -Recommend to continue with home supportive therapy.  History of BKA, left: -Followed by Orthopedics.  Obesity, class III: -Associated with diabetes mellitus type 1, hypertension, and hyperlipidemia. -Encourage to continue with weight loss efforts, dietary and lifestyle modifications.    - As part of my medical decision making, I reviewed the following data within the [place] History obtained from pt /family, CMA notes reviewed and incorporated if applicable, Labs reviewed, Radiograph/ tests reviewed if applicable and OV notes from prior OV's with me, as well as any other specialists she/he has seen since seeing me last, were all reviewed and used in my medical decision making process today.    - Additionally, when appropriate, discussion had with patient regarding our  treatment plan, and their biases/concerns about that plan were used in my medical decision making today.    - The patient agreed with the plan and demonstrated an understanding of the instructions.   No barriers to understanding were identified.     - The patient was advised to call back or seek an in-person evaluation if the symptoms worsen or if the condition fails to improve as anticipated.   Return for DM, HTN in 3-4 months; lab visit in 6-8 weeks (lipid panel, cmp, tsh, a1c).    No orders of the defined types were placed in this encounter.   Meds ordered this encounter  Medications  . Continuous Blood Gluc Sensor (DEXCOM G6 SENSOR) MISC    Sig: Use to check blood sugar continuously. Change every 10 days    Dispense:  3 each    Refill:  0    Medications Discontinued During This Encounter  Medication Reason  . Continuous Blood Gluc Sensor (DEXCOM G6 SENSOR) MISC Reorder       Time spent on visit including pre-visit chart review and post-visit care was 18 minutes.      The [place] was signed into law in 2016 which includes the topic  of electronic health records.  This provides immediate access to information in MyChart.  This includes consultation notes, operative notes, office notes, lab results and pathology reports.  If you have any questions about what you read please let us know at your next visit or call us at the office.  We are right here with you.   __________________________________________________________________________________     Patient Care Team    Relationship Specialty Notifications Start End  Michael Hutchinson, [place] PCP - General   05/12/19   Fayette Pho, PA Attending Physician Orthopedic Surgery  06/01/16   [name], MD Consulting Physician Orthopedic Surgery  07/04/16   Campbell Riches, MD Consulting Physician Infectious Diseases  07/04/16   Pieter Partridge, DO Consulting Physician Neurology  05/29/17   Renato Shin, MD Consulting  Physician Endocrinology  05/29/17    Comment: Treatment of diabetes  Philemon Kingdom, MD Consulting Physician Internal Medicine  07/11/17    Comment: Endocrinologist     -Vitals obtained; medications/ allergies reconciled;  personal medical, social, Sx etc.histories were updated by CMA, reviewed by me and are reflected in chart   Patient Active Problem List   Diagnosis Date Noted  . Carpal tunnel syndrome of right wrist 09/09/2019  . Cubital tunnel syndrome 09/09/2019  . Pronator syndrome 09/09/2019  . Cervical spine pain 08/07/2019  . Amputated below knee ([place]) 06/28/2019  . Cellulitis 06/28/2019  . Sacral back pain 12/26/2018  . Lumbar spondylosis 12/26/2018  . Chronic pain syndrome 11/07/2018  . Adhesive capsulitis of right shoulder 07/27/2018  . Tendinosis of right shoulder 04/09/2018  . Pain in joint of right shoulder 02/14/2018  . Burning in the chest 09/20/2017  . Statin intolerance 08/08/2017  . Multiple sclerosis ([place]) 07/11/2017  . Low hemoglobin 07/11/2017  . Lateral epicondylitis of right elbow 03/20/2017  . Pain in joint of right elbow 03/20/2017  . Chronic fatigue 07/04/2016  . Impaired exercise tolerance 07/04/2016  . Acute left otitis media 06/09/2016  . Hx of BKA, left - with subsequent cellulitis and treatment for osteomylitis 06/01/2016  . Abscess of left leg 04/26/2016  . Normocytic anemia 01/01/2016  . Osteomyelitis ([place]) 12/31/2015  . Diabetic ulcer of left heel associated with type 1 diabetes mellitus, with necrosis of bone ([place]) 12/31/2015  . Ankle wound 12/31/2015  . Chronic osteomyelitis of left ankle with draining sinus ([place])   . Type 1 diabetes, uncontrolled, with Charcot's joint of foot ([place]) 10/12/2015  . Low HDL (under 40) 09/22/2015  . Obesity, Class III, BMI 40-49.9 (morbid obesity) ([place]) 09/22/2015  . H/O noncompliance with medical treatment, presenting hazards to health 09/22/2015  . Hx MRSA infection 09/22/2015  . Vitamin D deficiency  09/22/2015  . Right foot injury, sequela 09/26/2014  . Charcot's joint of foot 11/25/2013  . Hypertension associated with diabetes ([place]) 05/03/2013  . Type I diabetes mellitus ([place]) 05/03/2013  . Body mass index (BMI) of 40.0-44.9 in adult ([place]) 05/03/2013  . Mixed diabetic hyperlipidemia associated with type 1 diabetes mellitus ([place]) 07/26/2012  . Hypercholesterolemia 07/26/2012  . Diabetic ketoacidosis ([place])   . GERD (gastroesophageal reflux disease)   . Meniscus tear   . Epigastric pain 12/10/2011  . Vomiting 12/10/2011  . Problems influencing health status 11/21/2011     Current Meds  Medication Sig  . [EXPIRED] amoxicillin-clavulanate (AUGMENTIN) 875-125 MG tablet Take 1 tablet by mouth 2 (two) times daily for 10 days.  Marland Kitchen aspirin 81 MG chewable tablet Chew 1 tablet (81 mg total) by mouth daily.  Marland Kitchen  BAYER MICROLET LANCETS lancets Use as instructed to check 3 times daily.  . Blood Glucose Monitoring Suppl (ONE TOUCH ULTRA SYSTEM KIT) w/Device KIT 1 kit by Does not apply route once. Check blood sugar 3 times daily. Diagnosis Diabetes ICD-10 E11.8  . Continuous Blood Gluc Receiver ([place]) [place] 1 each by Does not apply route See admin instructions. For use with continuous glucose monitoring system; E11.9  . Continuous Blood Gluc Sensor (DEXCOM G6 SENSOR) MISC Use to check blood sugar continuously. Change every 10 days  . Continuous Blood Gluc Transmit (DEXCOM G6 TRANSMITTER) MISC FOR USE WITH CONTINUOUS GLUCOSE MONITORING SYSTEM  . Glatiramer Acetate 40 MG/ML SOSY INJECT 40 MG (ONE SYRINGE) UNDER THE SKIN (SUBCUTANEOUS INJECTION) ON MONDAY WEDNESDAY AND FRIDAY (Patient taking differently: Inject 40 mg into the skin 3 (three) times a week. INJECT 40 MG (ONE SYRINGE) UNDER THE SKIN (SUBCUTANEOUS INJECTION) ON MONDAY WEDNESDAY AND FRIDAY)  . glucose blood (BAYER CONTOUR NEXT TEST) test strip 1 each by Other route 4 (four) times daily. And lancets 4/day  .  HYDROcodone-acetaminophen (NORCO/VICODIN) 5-325 MG tablet Take 1 tablet by mouth every 4 (four) hours as needed for severe pain.   . Insulin Disposable Pump (OMNIPOD DASH 5 PACK PODS) MISC 1 each by Does not apply route every 3 (three) days. E10.69  . insulin lispro ([name]) 200 UNIT/ML KwikPen Inject 100 Units into the skin daily. For use in pump; Will provide 30 day supply. (Patient taking differently: Inject 100 Units into the skin See admin instructions. For use in pump; sliding scale)  . Insulin Syringes, Disposable, U-100 1 ML MISC 3x daily  . losartan (COZAAR) 50 MG tablet 1 po qd (Patient taking differently: Take 50 mg by mouth daily. 1 po qd)  . metoprolol succinate (TOPROL-XL) 50 MG 24 hr tablet Take 1 tablet (50 mg total) by mouth daily. Take with or immediately following a meal.  . omeprazole (PRILOSEC) 20 MG capsule Take 1 capsule (20 mg total) by mouth daily.  . Vitamin D, Ergocalciferol, (DRISDOL) 1.25 MG (50000 UNIT) CAPS capsule 1 tab every Wednesday and Sunday (Patient taking differently: Take 50,000 Units by mouth 2 (two) times a week. 1 tab every Wednesday and Sunday)  . [DISCONTINUED] Continuous Blood Gluc Sensor (DEXCOM G6 SENSOR) MISC APPLY 1 SENSOR AS DIRECTED AND REPLACE EVERY 10 DAYS.     Allergies:  Allergies  Allergen Reactions  . Influenza Virus Vaccine Split Nausea And Vomiting  . Hydrochlorothiazide Other (See Comments)    Dizziness  . Lisinopril Cough     ROS:  See above HPI for pertinent positives and negatives   Objective:   Height 6' (1.829 m), weight (!) 370 lb (167.8 kg).  (if some vitals are omitted, this means that patient was UNABLE to obtain them even though they were asked to get them prior to OV today.  They were asked to call us at their earliest convenience with these once obtained. ) General: A & O * 3; sounds in no acute distress Respiratory: speaking in full sentences, no conversational dyspnea Psych: insight appears good, mood-  appears full

## 2019-10-18 DIAGNOSIS — G35 Multiple sclerosis: Secondary | ICD-10-CM | POA: Diagnosis not present

## 2019-10-21 ENCOUNTER — Encounter (HOSPITAL_COMMUNITY): Payer: Self-pay | Admitting: Orthopedic Surgery

## 2019-10-21 ENCOUNTER — Other Ambulatory Visit: Payer: Self-pay

## 2019-10-21 NOTE — Progress Notes (Signed)
Michael Hutchinson denies chest pain or shortness of breath.  Michael Hutchinson tested positive for Covid in September of 2021, patient said he has a rare cough. Michael Hutchinson has type I diabetes, patient reports that CBGs 1354 -220.  Patient has a continvous blood sugar monitor.  Patient is currently between Endocrinologist.  I instructed patient to decrease Basal rate by 20% at midnight. I instructed patient to check CBG after awaking and every 2 hours until arrival  to the hospital.  I Instructed patient if CBG is less than 70 to drink 1/2 cup of a clear juice. Patient will suspend Insulin delivery from Insulin pump. Recheck CBG in 15 minutes then call pre- op desk at [phone] for further instructions. I instructed patient to bring Insulin and pump and all supplies , incase pump should be discontinued.

## 2019-10-22 ENCOUNTER — Encounter (HOSPITAL_COMMUNITY)
Admission: RE | Disposition: A | Discharge status: Home / Self Care | Payer: Self-pay | Source: Home / Self Care | Attending: Orthopedic Surgery

## 2019-10-22 ENCOUNTER — Encounter (HOSPITAL_COMMUNITY): Payer: Self-pay | Admitting: Orthopedic Surgery

## 2019-10-22 ENCOUNTER — Ambulatory Visit (HOSPITAL_COMMUNITY)
Admission: RE | Admit: 2019-10-22 | Discharge: 2019-10-22 | Disposition: A | Discharge status: Home / Self Care | Payer: BC Managed Care – PPO | Attending: Orthopedic Surgery | Admitting: Orthopedic Surgery

## 2019-10-22 ENCOUNTER — Other Ambulatory Visit: Payer: Self-pay

## 2019-10-22 ENCOUNTER — Ambulatory Visit (HOSPITAL_COMMUNITY): Payer: BC Managed Care – PPO | Admitting: Anesthesiology

## 2019-10-22 DIAGNOSIS — M1990 Unspecified osteoarthritis, unspecified site: Secondary | ICD-10-CM | POA: Insufficient documentation

## 2019-10-22 DIAGNOSIS — M7711 Lateral epicondylitis, right elbow: Secondary | ICD-10-CM | POA: Insufficient documentation

## 2019-10-22 DIAGNOSIS — I10 Essential (primary) hypertension: Secondary | ICD-10-CM | POA: Diagnosis not present

## 2019-10-22 DIAGNOSIS — Z823 Family history of stroke: Secondary | ICD-10-CM | POA: Insufficient documentation

## 2019-10-22 DIAGNOSIS — Z981 Arthrodesis status: Secondary | ICD-10-CM | POA: Insufficient documentation

## 2019-10-22 DIAGNOSIS — Z9049 Acquired absence of other specified parts of digestive tract: Secondary | ICD-10-CM | POA: Insufficient documentation

## 2019-10-22 DIAGNOSIS — Z833 Family history of diabetes mellitus: Secondary | ICD-10-CM | POA: Insufficient documentation

## 2019-10-22 DIAGNOSIS — M8660 Other chronic osteomyelitis, unspecified site: Secondary | ICD-10-CM | POA: Insufficient documentation

## 2019-10-22 DIAGNOSIS — M778 Other enthesopathies, not elsewhere classified: Secondary | ICD-10-CM | POA: Diagnosis not present

## 2019-10-22 DIAGNOSIS — G5611 Other lesions of median nerve, right upper limb: Secondary | ICD-10-CM | POA: Insufficient documentation

## 2019-10-22 DIAGNOSIS — G5601 Carpal tunnel syndrome, right upper limb: Secondary | ICD-10-CM | POA: Insufficient documentation

## 2019-10-22 DIAGNOSIS — Z89512 Acquired absence of left leg below knee: Secondary | ICD-10-CM | POA: Insufficient documentation

## 2019-10-22 DIAGNOSIS — U099 Post covid-19 condition, unspecified: Secondary | ICD-10-CM | POA: Insufficient documentation

## 2019-10-22 DIAGNOSIS — R059 Cough, unspecified: Secondary | ICD-10-CM | POA: Insufficient documentation

## 2019-10-22 DIAGNOSIS — Z8614 Personal history of Methicillin resistant Staphylococcus aureus infection: Secondary | ICD-10-CM | POA: Diagnosis not present

## 2019-10-22 DIAGNOSIS — G5621 Lesion of ulnar nerve, right upper limb: Secondary | ICD-10-CM | POA: Diagnosis not present

## 2019-10-22 DIAGNOSIS — Z887 Allergy status to serum and vaccine status: Secondary | ICD-10-CM | POA: Insufficient documentation

## 2019-10-22 DIAGNOSIS — Z888 Allergy status to other drugs, medicaments and biological substances status: Secondary | ICD-10-CM | POA: Insufficient documentation

## 2019-10-22 DIAGNOSIS — D649 Anemia, unspecified: Secondary | ICD-10-CM | POA: Insufficient documentation

## 2019-10-22 DIAGNOSIS — M67823 Other specified disorders of tendon, right elbow: Secondary | ICD-10-CM | POA: Insufficient documentation

## 2019-10-22 DIAGNOSIS — E109 Type 1 diabetes mellitus without complications: Secondary | ICD-10-CM | POA: Insufficient documentation

## 2019-10-22 DIAGNOSIS — Z794 Long term (current) use of insulin: Secondary | ICD-10-CM | POA: Diagnosis not present

## 2019-10-22 DIAGNOSIS — Z87891 Personal history of nicotine dependence: Secondary | ICD-10-CM | POA: Insufficient documentation

## 2019-10-22 DIAGNOSIS — R000 Tachycardia, unspecified: Secondary | ICD-10-CM | POA: Insufficient documentation

## 2019-10-22 DIAGNOSIS — Z803 Family history of malignant neoplasm of breast: Secondary | ICD-10-CM | POA: Insufficient documentation

## 2019-10-22 DIAGNOSIS — E7800 Pure hypercholesterolemia, unspecified: Secondary | ICD-10-CM | POA: Insufficient documentation

## 2019-10-22 DIAGNOSIS — Z8349 Family history of other endocrine, nutritional and metabolic diseases: Secondary | ICD-10-CM | POA: Insufficient documentation

## 2019-10-22 DIAGNOSIS — Z8249 Family history of ischemic heart disease and other diseases of the circulatory system: Secondary | ICD-10-CM | POA: Diagnosis not present

## 2019-10-22 DIAGNOSIS — Z811 Family history of alcohol abuse and dependence: Secondary | ICD-10-CM | POA: Insufficient documentation

## 2019-10-22 DIAGNOSIS — Z791 Long term (current) use of non-steroidal anti-inflammatories (NSAID): Secondary | ICD-10-CM | POA: Insufficient documentation

## 2019-10-22 DIAGNOSIS — Z808 Family history of malignant neoplasm of other organs or systems: Secondary | ICD-10-CM | POA: Insufficient documentation

## 2019-10-22 DIAGNOSIS — K219 Gastro-esophageal reflux disease without esophagitis: Secondary | ICD-10-CM | POA: Diagnosis not present

## 2019-10-22 DIAGNOSIS — Z79899 Other long term (current) drug therapy: Secondary | ICD-10-CM | POA: Insufficient documentation

## 2019-10-22 HISTORY — PX: CARPAL TUNNEL WITH CUBITAL TUNNEL: SHX5608

## 2019-10-22 HISTORY — DX: Pneumonia, unspecified organism: J18.9

## 2019-10-22 LAB — SURGICAL PCR SCREEN
MRSA, PCR: NEGATIVE
Staphylococcus aureus: NEGATIVE

## 2019-10-22 LAB — COMPREHENSIVE METABOLIC PANEL
ALT: 40 U/L (ref 0–44)
AST: 39 U/L (ref 15–41)
Albumin: 3.6 g/dL (ref 3.5–5.0)
Alkaline Phosphatase: 78 U/L (ref 38–126)
Anion gap: 13 (ref 5–15)
BUN: 19 mg/dL (ref 6–20)
CO2: 22 mmol/L (ref 22–32)
Calcium: 9.2 mg/dL (ref 8.9–10.3)
Chloride: 95 mmol/L — ABNORMAL LOW (ref 98–111)
Creatinine, Ser: 1.04 mg/dL (ref 0.61–1.24)
GFR, Estimated: >60 mL/min (ref >60)
Glucose, Bld: 156 mg/dL — ABNORMAL HIGH (ref 70–99)
Potassium: 4.9 mmol/L (ref 3.5–5.1)
Sodium: 130 mmol/L — ABNORMAL LOW (ref 135–145)
Total Bilirubin: 1.7 mg/dL — ABNORMAL HIGH (ref 0.3–1.2)
Total Protein: 6.7 g/dL (ref 6.5–8.1)

## 2019-10-22 LAB — GLUCOSE, CAPILLARY
Glucose-Capillary: 157 mg/dL — ABNORMAL HIGH (ref 70–99)
Glucose-Capillary: 177 mg/dL — ABNORMAL HIGH (ref 70–99)
Glucose-Capillary: 169 mg/dL — ABNORMAL HIGH (ref 70–99)

## 2019-10-22 LAB — CBC
HCT: 41 % (ref 39.0–52.0)
Hemoglobin: 13.5 g/dL (ref 13.0–17.0)
MCH: 30.8 pg (ref 26.0–34.0)
MCHC: 32.9 g/dL (ref 30.0–36.0)
MCV: 93.6 fL (ref 80.0–100.0)
Platelets: 291 10*3/uL (ref 150–400)
RBC: 4.38 MIL/uL (ref 4.22–5.81)
RDW: 11.9 % (ref 11.5–15.5)
WBC: 5.3 10*3/uL (ref 4.0–10.5)
nRBC: 0 % (ref 0.0–0.2)

## 2019-10-22 SURGERY — RELEASE, CARPAL TUNNEL AND CUBITAL TUNNEL
Anesthesia: Monitor Anesthesia Care | Site: Arm Lower | Laterality: Right

## 2019-10-22 MED ORDER — LIDOCAINE HCL (CARDIAC) PF 100 MG/5ML IV SOSY
PREFILLED_SYRINGE | INTRAVENOUS | Status: DC | PRN
  Administered 2019-10-22: 50 mg via INTRAVENOUS

## 2019-10-22 MED ORDER — PROPOFOL 500 MG/50ML IV EMUL
INTRAVENOUS | Status: DC | PRN
  Administered 2019-10-22: 30 mg via INTRAVENOUS
  Administered 2019-10-22: 50 mg via INTRAVENOUS

## 2019-10-22 MED ORDER — CHLORHEXIDINE GLUCONATE 0.12 % MT SOLN: 15.0000 mL | Freq: Once | OROMUCOSAL | Status: AC

## 2019-10-22 MED ORDER — ONDANSETRON HCL 4 MG/2ML IJ SOLN
INTRAMUSCULAR | Status: AC
  Filled 2019-10-22: qty 2

## 2019-10-22 MED ORDER — METOPROLOL TARTRATE 5 MG/5ML IV SOLN
INTRAVENOUS | Status: AC
  Filled 2019-10-22: qty 5

## 2019-10-22 MED ORDER — OXYCODONE HCL 5 MG PO TABS: 5.0000 mg | ORAL_TABLET | ORAL | 0 refills | Status: DC | PRN

## 2019-10-22 MED ORDER — ONDANSETRON HCL 4 MG/2ML IJ SOLN
INTRAMUSCULAR | Status: DC | PRN
  Administered 2019-10-22: 4 mg via INTRAVENOUS

## 2019-10-22 MED ORDER — MIDAZOLAM HCL 2 MG/2ML IJ SOLN
INTRAMUSCULAR | Status: DC | PRN
  Administered 2019-10-22 (×2): 1 mg via INTRAVENOUS

## 2019-10-22 MED ORDER — DEXTROSE 5 % IV SOLN
3.0000 g | INTRAVENOUS | Status: AC
  Administered 2019-10-22: 3 g via INTRAVENOUS
  Filled 2019-10-22: qty 3

## 2019-10-22 MED ORDER — MIDAZOLAM HCL 2 MG/2ML IJ SOLN: 2.0000 mg | Freq: Once | INTRAMUSCULAR | Status: AC

## 2019-10-22 MED ORDER — MIDAZOLAM HCL 2 MG/2ML IJ SOLN
INTRAMUSCULAR | Status: AC
  Administered 2019-10-22: 2 mg via INTRAVENOUS
  Filled 2019-10-22: qty 2

## 2019-10-22 MED ORDER — FENTANYL CITRATE (PF) 100 MCG/2ML IJ SOLN: 50.0000 ug | Freq: Once | INTRAMUSCULAR | Status: AC

## 2019-10-22 MED ORDER — ROPIVACAINE HCL 5 MG/ML IJ SOLN
INTRAMUSCULAR | Status: DC | PRN
  Administered 2019-10-22: 30 mL via EPIDURAL

## 2019-10-22 MED ORDER — LACTATED RINGERS IV SOLN: INTRAVENOUS | Status: DC

## 2019-10-22 MED ORDER — FENTANYL CITRATE (PF) 250 MCG/5ML IJ SOLN
INTRAMUSCULAR | Status: AC
  Filled 2019-10-22: qty 5

## 2019-10-22 MED ORDER — CHLORHEXIDINE GLUCONATE 0.12 % MT SOLN
OROMUCOSAL | Status: AC
  Administered 2019-10-22: 15 mL via OROMUCOSAL
  Filled 2019-10-22: qty 15

## 2019-10-22 MED ORDER — MIDAZOLAM HCL 2 MG/2ML IJ SOLN
INTRAMUSCULAR | Status: AC
  Filled 2019-10-22: qty 2

## 2019-10-22 MED ORDER — PROPOFOL 500 MG/50ML IV EMUL: INTRAVENOUS | Status: DC | PRN

## 2019-10-22 MED ORDER — PROPOFOL 10 MG/ML IV BOLUS
INTRAVENOUS | Status: AC
  Filled 2019-10-22: qty 20

## 2019-10-22 MED ORDER — FENTANYL CITRATE (PF) 100 MCG/2ML IJ SOLN: 25.0000 ug | INTRAMUSCULAR | Status: DC | PRN

## 2019-10-22 MED ORDER — PROPOFOL 500 MG/50ML IV EMUL
INTRAVENOUS | Status: DC | PRN
  Administered 2019-10-22: 60 ug/kg/min via INTRAVENOUS

## 2019-10-22 MED ORDER — ORAL CARE MOUTH RINSE: 15.0000 mL | Freq: Once | OROMUCOSAL | Status: AC

## 2019-10-22 MED ORDER — 0.9 % SODIUM CHLORIDE (POUR BTL) OPTIME
TOPICAL | Status: DC | PRN
  Administered 2019-10-22: 1000 mL

## 2019-10-22 MED ORDER — PROMETHAZINE HCL 25 MG/ML IJ SOLN: 6.2500 mg | INTRAMUSCULAR | Status: DC | PRN

## 2019-10-22 MED ORDER — DOXYCYCLINE HYCLATE 50 MG PO CAPS: 100.0000 mg | ORAL_CAPSULE | Freq: Two times a day (BID) | ORAL | 0 refills | Status: AC

## 2019-10-22 MED ORDER — METHOCARBAMOL 500 MG PO TABS: 500.0000 mg | ORAL_TABLET | Freq: Four times a day (QID) | ORAL | 0 refills | Status: DC

## 2019-10-22 MED ORDER — METOPROLOL TARTRATE 5 MG/5ML IV SOLN
5.0000 mg | Freq: Once | INTRAVENOUS | Status: AC
  Administered 2019-10-22: 5 mg via INTRAVENOUS

## 2019-10-22 MED ORDER — PROPOFOL 1000 MG/100ML IV EMUL
INTRAVENOUS | Status: AC
  Filled 2019-10-22: qty 100

## 2019-10-22 MED ORDER — FENTANYL CITRATE (PF) 100 MCG/2ML IJ SOLN
INTRAMUSCULAR | Status: AC
  Administered 2019-10-22: 50 ug via INTRAVENOUS
  Filled 2019-10-22: qty 2

## 2019-10-22 SURGICAL SUPPLY — 50 items
BNDG ELASTIC 3X5.8 VLCR STR LF (GAUZE/BANDAGES/DRESSINGS) ×2 IMPLANT
BNDG ELASTIC 4X5.8 VLCR STR LF (GAUZE/BANDAGES/DRESSINGS) ×5 IMPLANT
BNDG GAUZE ELAST 4 BULKY (GAUZE/BANDAGES/DRESSINGS) ×6 IMPLANT
CORD BIPOLAR FORCEPS 12FT (ELECTRODE) ×2 IMPLANT
COVER SURGICAL LIGHT HANDLE (MISCELLANEOUS) ×2 IMPLANT
COVER WAND RF STERILE (DRAPES) ×2 IMPLANT
CUFF TOURN SGL QUICK 18X4 (TOURNIQUET CUFF) ×1 IMPLANT
CUFF TOURN SGL QUICK 24 (TOURNIQUET CUFF) ×2
CUFF TRNQT CYL 24X4X16.5-23 (TOURNIQUET CUFF) IMPLANT
DRAPE SURG 17X23 STRL (DRAPES) ×2 IMPLANT
DRSG ADAPTIC 3X8 NADH LF (GAUZE/BANDAGES/DRESSINGS) ×1 IMPLANT
EVACUATOR 1/8 PVC DRAIN (DRAIN) IMPLANT
GAUZE SPONGE 4X4 12PLY STRL (GAUZE/BANDAGES/DRESSINGS) ×2 IMPLANT
GAUZE XEROFORM 1X8 LF (GAUZE/BANDAGES/DRESSINGS) ×1 IMPLANT
GAUZE XEROFORM 5X9 LF (GAUZE/BANDAGES/DRESSINGS) ×1 IMPLANT
GLOVE BIOGEL M 8.0 STRL (GLOVE) ×2 IMPLANT
GLOVE SS BIOGEL STRL SZ 8 (GLOVE) ×1 IMPLANT
GLOVE SUPERSENSE BIOGEL SZ 8 (GLOVE) ×1
GOWN STRL REUS W/ TWL LRG LVL3 (GOWN DISPOSABLE) ×1 IMPLANT
GOWN STRL REUS W/ TWL XL LVL3 (GOWN DISPOSABLE) ×1 IMPLANT
GOWN STRL REUS W/TWL LRG LVL3 (GOWN DISPOSABLE) ×2
GOWN STRL REUS W/TWL XL LVL3 (GOWN DISPOSABLE) ×2
KIT BASIN OR (CUSTOM PROCEDURE TRAY) ×2 IMPLANT
KIT TURNOVER KIT B (KITS) ×2 IMPLANT
LOOP VESSEL MAXI BLUE (MISCELLANEOUS) IMPLANT
NDL HYPO 25GX1X1/2 BEV (NEEDLE) IMPLANT
NEEDLE HYPO 25GX1X1/2 BEV (NEEDLE) IMPLANT
NS IRRIG 1000ML POUR BTL (IV SOLUTION) ×2 IMPLANT
PACK ORTHO EXTREMITY (CUSTOM PROCEDURE TRAY) ×2 IMPLANT
PAD ARMBOARD 7.5X6 YLW CONV (MISCELLANEOUS) ×4 IMPLANT
PAD CAST 4YDX4 CTTN HI CHSV (CAST SUPPLIES) ×2 IMPLANT
PADDING CAST COTTON 4X4 STRL (CAST SUPPLIES) ×4
PASSER SUT SWANSON 36MM LOOP (INSTRUMENTS) ×1 IMPLANT
SLING ARM FOAM STRAP XLG (SOFTGOODS) ×1 IMPLANT
SOL PREP POV-IOD 4OZ 10% (MISCELLANEOUS) ×6 IMPLANT
SUCTION FRAZIER HANDLE 10FR (MISCELLANEOUS)
SUCTION TUBE FRAZIER 10FR DISP (MISCELLANEOUS) IMPLANT
SUT PROLENE 3 0 PS 2 (SUTURE) ×5 IMPLANT
SUT PROLENE 4 0 PS 2 18 (SUTURE) ×2 IMPLANT
SUT VIC AB 2-0 CT1 27 (SUTURE)
SUT VIC AB 2-0 CT1 TAPERPNT 27 (SUTURE) IMPLANT
SUT VIC AB 3-0 FS2 27 (SUTURE) IMPLANT
SYR CONTROL 10ML LL (SYRINGE) IMPLANT
SYSTEM CHEST DRAIN TLS 7FR (DRAIN) IMPLANT
TOWEL GREEN STERILE (TOWEL DISPOSABLE) ×2 IMPLANT
TOWEL GREEN STERILE FF (TOWEL DISPOSABLE) ×2 IMPLANT
TUBE CONNECTING 12X1/4 (SUCTIONS) IMPLANT
TUBE EVACUATION TLS (MISCELLANEOUS) ×2 IMPLANT
UNDERPAD 30X36 HEAVY ABSORB (UNDERPADS AND DIAPERS) ×2 IMPLANT
WATER STERILE IRR 1000ML POUR (IV SOLUTION) ×2 IMPLANT

## 2019-10-22 NOTE — Progress Notes (Signed)
Inpatient Diabetes Program Recommendations  AACE/ADA: New Consensus Statement on Inpatient Glycemic Control (2015)  Target Ranges:  Prepandial:   less than 140 mg/dL      Peak postprandial:   less than 180 mg/dL (1-2 hours)      Critically ill patients:  140 - 180 mg/dL   Lab Results  Component Value Date   GLUCAP 157 (H) 10/22/2019   HGBA1C 7.5 (H) 07/03/2019    Review of Glycemic Control Results for Michael Hutchinson, Michael Hutchinson "Michael Hutchinson" (MRN [id number]) as of 10/22/2019 11:16  Ref. Range 10/22/2019 11:02  Glucose-Capillary Latest Ref Range: 70 - 99 mg/dL 403 (H)   Diabetes history: Type 1 DM Outpatient Diabetes medications:  Omni Pod insulin pump/Dexcom sensor  Current orders for Inpatient glycemic control:  Insulin pump/  Inpatient Diabetes Program Recommendations:    Patient with history of Type 1 DM and therefore needs insulin pump.  Patient may wear insulin pump during surgery if okay with anesthesia.  Recommend checking blood sugar hourly while in surgery and until patient is alert and oriented.   Thanks,  Beryl Meager, RN, BC-ADM Inpatient Diabetes Coordinator Pager [phone] (8a-5p)

## 2019-10-22 NOTE — Anesthesia Postprocedure Evaluation (Signed)
Anesthesia Post Note  Patient: Michael Hutchinson  Procedure(s) Performed: Right carpal tunnel release, right cubital tunnel release in situ, Right pronator/proximal median nerve release, Right elbow ECRB debridement with partial ostectomy and repair as necessary (Right Arm Lower)     Patient location during evaluation: PACU Anesthesia Type: Regional and MAC Level of consciousness: awake and alert Pain management: pain level controlled Vital Signs Assessment: post-procedure vital signs reviewed and stable Respiratory status: spontaneous breathing, nonlabored ventilation, respiratory function stable and patient connected to nasal cannula oxygen Cardiovascular status: stable and blood pressure returned to baseline Postop Assessment: no apparent nausea or vomiting Anesthetic complications: no   No complications documented.  Last Vitals:  Vitals:   10/22/19 1555 10/22/19 1610  BP: 120/83 105/66  Pulse: 99 99  Resp: 20 18  Temp:  37.1 C  SpO2: 97% 97%    Last Pain:  Vitals:   10/22/19 1555  TempSrc:   PainSc: 0-No pain                 Belenda Cruise P Falen Lehrmann

## 2019-10-22 NOTE — Anesthesia Procedure Notes (Signed)
Anesthesia Regional Block: Supraclavicular block   Pre-Anesthetic Checklist: ,, timeout performed, Correct Patient, Correct Site, Correct Laterality, Correct Procedure, Correct Position, site marked, Risks and benefits discussed,  Surgical consent,  Pre-op evaluation,  At surgeon's request and post-op pain management  Laterality: Right  Prep: chloraprep       Needles:  Injection technique: Single-shot  Needle Type: Echogenic Stimulator Needle     Needle Length: 10cm  Needle Gauge: 20     Additional Needles:   Procedures:,,,, ultrasound used (permanent image in chart),,,,  Narrative:  Start time: 10/22/2019 11:55 AM End time: 10/22/2019 12:00 PM Injection made incrementally with aspirations every 5 mL.  Performed by: Personally  Anesthesiologist: Mellody Dance, MD  Additional Notes: Standard monitors applied. Skin prepped. Good needle visualization with ultrasound. Injection made in 5cc increments with no resistance to injection. Patient tolerated the procedure well.

## 2019-10-22 NOTE — Op Note (Signed)
Operative note 10/22/2019  Michael Severin MD  Preoperative diagnosis: Right carpal tunnel syndrome.  Right cubital tunnel syndrome.  Right proximal median nerve compression neuropathy.  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  Once taken to the operative theater he underwent Hibiclens scrub x2 performed by myself followed by 10-minute surgical Betadine scrub and paint.  Arm was elevated tourniquet was insufflated and a timeout was observed.  At this time we verified antibiotic prophylaxis and other measures.  The patient was comfortable and lightly sedated but awake for the entire procedure.  The operation commenced with a 1 inch incision about the transverse carpal ligament region of the palm under 250 mm of tourniquet control.  Dissection was carried out through the skin with knife blade palmar fascia was incised retractors placed distal edge of transverse carpal ligament was identified and released under 4.5 loupe magnification.  Fat  Nicely superficial palmar arch verified and protected.  Following this distal proximal dissection was carried out until adequate room was available to slide seizures more proximally.  The patient had very tight bands about the antebrachial fascia and thus I made a small transverse incision in the distal third of the wrist and released additional antebrachial fascia.  I inspected the area.   Median nerve was hyperemic intact and fully decompressed.  We irrigated copiously and closed wound with 3-0 Prolene.  Following this attention was turned towards the posterior medial elbow.  3 inch incision was made dissection was carried down and the ulnar nerve was identified proximally.  We perform release about the arcade of Struthers medial muscular septum cubital tunnel Osborne's ligament and the 2 heads of the FCU both superficial and deep.  The patient had very tight confines about the ligament of Struthers and cubital tunnel distally.  The nerve did not subluxate look stable.  The medial antebrachial cutaneous nerve branch was not injured.  There were no complications.  We irrigated copiously and closed wound with 3-0 Prolene.  Following this I made a extensile incision lazy S in nature about the antecubital fossa region dissection was carried down brachial artery was identified as was median nerve and the biceps.  I released the lacertus fibrosis followed by release of the proximal portion of the pronator and the fibrous arch of the FDS.  I made sure the median nerve was completely decompressed.  Following this we irrigated copiously and closed wound after the tourniquet was deflated with 3-0 Prolene.  The median nerve was completely decompressed.  Thus the median nerve at the elbow and wrist and the ulnar nerve at the elbow were released without difficulty.  His preoperative nerve studies were quite dramatic and his preoperative symptoms followed suit with significant sensory disturbance.  Once this was complete the lateral elbow was addressed.  The lateral elbow of course has chronic ECRB tearing significant nature.  Dissection was carried down with an incision following this I then performed a debridement of the ECRB  tendon after incising and creating a plane with retraction of the ECU and ECRL tendons.  Partial ostectomy was performed.  The bone was then peppered with a drill bit to allow  for angioblastic response to healing.  I made sure that all of the abnormal tissue was removed and following this irrigated copiously followed by pants over vest closure to staff to help the ECRL muscle fibers against the bony bed.  The patient tolerated this well there are no complicating features there is no instability.  We irrigated copiously with the tourniquet deflated and ultimately closed the wound with Prolene.  The patient tolerated this well.  Adaptic Xeroform and a sterile bandage were placed followed by a long-arm splint.  The patient was awake alert and oriented.  All sponge needle and instrument counts reported correct.  I discussed all issues with his wife and I will see him back in the office in 14 days for standard postop algorithm.  Michael Saling MD

## 2019-10-22 NOTE — Discharge Instructions (Signed)
We will call for your follow-up appointment to be seen in 14 days.  Please elevate move massage her fingers.  Please call Dr. Amanda Pea for any problems.  We recommend vitamin B 6 200 mg a day and vitamin C 1000 mg a day to promote nerve health and wound healing.  Please watch your blood sugars closely.  Please notify should any problems occur.  We have dispensed pain medicine and a muscle relaxer as well as something for nausea and an antibiotic for 7 days.  Please take these as directed and as instructed.  Please keep your bandage clean and dry at all times.

## 2019-10-22 NOTE — Anesthesia Preprocedure Evaluation (Addendum)
Anesthesia Evaluation  Patient identified by MRN, date of birth, ID band Patient awake    Reviewed: Allergy & Precautions, NPO status , Patient's Chart, lab work & pertinent test results  Airway Mallampati: II  TM Distance: >3 FB Neck ROM: Full   Comment: Large neck circumference Dental no notable dental hx.    Pulmonary  COVID 19 + at the end of September. + residual cough   Pulmonary exam normal breath sounds clear to auscultation       Cardiovascular Exercise Tolerance: Poor hypertension,  Rhythm:Regular Rate:Tachycardia  EKG Sinus tachycardia   Neuro/Psych  Neuromuscular disease (carpal tunnell) negative psych ROS   GI/Hepatic Neg liver ROS, GERD  ,  Endo/Other  diabetes, Poorly Controlled, Type 1, Insulin Dependent  Renal/GU negative Renal ROS     Musculoskeletal  (+) Arthritis ,   Abdominal   Peds negative pediatric ROS (+)  Hematology  (+) anemia ,   Anesthesia Other Findings Chronic osteomyelitis H/o BKA  Reproductive/Obstetrics                           Anesthesia Physical Anesthesia Plan  ASA: III  Anesthesia Plan: Regional and MAC   Post-op Pain Management:  Regional for Post-op pain   Induction: Intravenous  PONV Risk Score and Plan: 1 and Propofol infusion, TIVA, Treatment may vary due to age or medical condition and Ondansetron  Airway Management Planned: Simple Face Mask and Natural Airway  Additional Equipment:   Intra-op Plan:   Post-operative Plan:   Informed Consent:   Plan Discussed with:   Anesthesia Plan Comments:        Anesthesia Quick Evaluation

## 2019-10-22 NOTE — Transfer of Care (Signed)
Immediate Anesthesia Transfer of Care Note  Patient: Michael Hutchinson  Procedure(s) Performed: Right carpal tunnel release, right cubital tunnel release in situ, Right pronator/proximal median nerve release, Right elbow ECRB debridement with partial ostectomy and repair as necessary (Right Arm Lower)  Patient Location: PACU  Anesthesia Type:MAC combined with regional for post-op pain  Level of Consciousness: awake, alert  and oriented  Airway & Oxygen Therapy: Patient Spontanous Breathing  Post-op Assessment: Report given to RN and Post -op Vital signs reviewed and stable  Post vital signs: Reviewed and stable  Last Vitals:  Vitals Value Taken Time  BP 161/109 10/22/19 1538  Temp    Pulse 97 10/22/19 1542  Resp 17 10/22/19 1542  SpO2 97 % 10/22/19 1542  Vitals shown include unvalidated device data.  Last Pain:  Vitals:   10/22/19 1100  TempSrc: Oral  PainSc: 3       Patients Stated Pain Goal: 3 (95/74/73 4037)  Complications: No complications documented.

## 2019-10-22 NOTE — Anesthesia Procedure Notes (Addendum)
Procedure Name: MAC Date/Time: 10/22/2019 2:03 PM Performed by: Michele Rockers, CRNA Pre-anesthesia Checklist: Patient identified, Emergency Drugs available, Suction available, Timeout performed and Patient being monitored Patient Re-evaluated:Patient Re-evaluated prior to induction Oxygen Delivery Method: Simple face mask

## 2019-10-22 NOTE — H&P (Signed)
Michael Hutchinson is an 44 y.o. male.   Chief Complaint: Patient presents for surgical reconstruction right upper extremity as outlined HPI: Patient presents for evaluation and treatment of the of their upper extremity predicament. The patient denies neck, back, chest or  abdominal pain. The patient notes that they have no lower extremity problems. The patients primary complaint is noted. We are planning surgical care pathway for the upper extremity.  Past Medical History:  Diagnosis Date  . Charcot's joint of foot 11/25/2013  . Complication of anesthesia    "I wake up angry" (12/31/2015)  . Diabetes mellitus type 1 (HCC) dx'd 1981  . Diabetic ketoacidosis (HCC)   . Essential hypertension 05/03/2013  . GERD (gastroesophageal reflux disease)   . High cholesterol   . Hx MRSA infection    Inner thigh and under arm- healed areas  . Meniscus tear   . Pneumonia    2016 ish  . Shortness of breath    with exertion only    Past Surgical History:  Procedure Laterality Date  . AMPUTATION Left 01/01/2016   Procedure: AMPUTATION BELOW KNEE;  Surgeon: Toni Arthurs, MD;  Location: [facility] OR;  Service: Orthopedics;  Laterality: Left;  . APPLICATION OF WOUND VAC  04/26/2016  . HERNIA REPAIR    . I & D EXTREMITY Left 04/26/2016   Procedure: IRRIGATION AND DEBRIDEMENT EXTREMITY/Left Leg/Possible Wound Vac;  Surgeon: Toni Arthurs, MD;  Location: [facility] OR;  Service: Orthopedics;  Laterality: Left;  . INCISE AND DRAIN ABCESS Left 04/26/2016  . KNEE ARTHROSCOPY Left ~ 2010  . LAPAROSCOPIC CHOLECYSTECTOMY  2015  . METACARPOPHALANGEAL JOINT ARTHRODESIS Left 06/2012   Fracture left index finger intra-articular MCP joint/notes 06/30/2012  . OPEN REDUCTION INTERNAL FIXATION (ORIF) PROXIMAL PHALANX Left 06/30/2012   Procedure: OPEN REDUCTION INTERNAL FIXATION (ORIF) LEFT INDEX FINGER PROXIMAL PHALANX FRACTURE WITH LIGAMENT REPAIR AS NECESSARY;  Surgeon: Dominica Severin, MD;  Location: [facility] OR;  Service: Orthopedics;   Laterality: Left;  . UMBILICAL HERNIA REPAIR  2015   "w/gallbladder OR"    Family History  Problem Relation Age of Onset  . Other Mother   . Cancer Mother        Breast / Bone  . Heart attack Father   . Hypertension Father   . Hyperlipidemia Father   . Diabetes Other   . Alcohol abuse Sister   . Diabetes Maternal Grandfather   . Stroke Paternal Grandmother   . Alcohol abuse Paternal Grandfather    Social History:  reports that he has never smoked. He quit smokeless tobacco use about 9 years ago.  His smokeless tobacco use included snuff and chew. He reports current alcohol use of about 12.0 standard drinks of alcohol per week. He reports that he does not use drugs.  Allergies:  Allergies  Allergen Reactions  . Influenza Virus Vaccine Split Nausea And Vomiting  . Hydrochlorothiazide Other (See Comments)    Dizziness  . Lisinopril Cough    Medications Prior to Admission  Medication Sig Dispense Refill  . gabapentin (NEURONTIN) 100 MG capsule Take 100-300 mg by mouth at bedtime as needed (pain.).     Marland Kitchen Glatiramer Acetate 40 MG/ML SOSY INJECT 40 MG (ONE SYRINGE) UNDER THE SKIN (SUBCUTANEOUS INJECTION) ON MONDAY WEDNESDAY AND FRIDAY (Patient taking differently: Inject 40 mg into the skin every Monday, Wednesday, and Friday. INJECT 40 MG (ONE SYRINGE) UNDER THE SKIN (SUBCUTANEOUS INJECTION) ON MONDAY WEDNESDAY AND FRIDAY) 12 mL 0  . HYDROcodone-acetaminophen (NORCO/VICODIN) 5-325 MG tablet Take 1  tablet by mouth every 4 (four) hours as needed for severe pain.     Marland Kitchen ibuprofen (ADVIL) 200 MG tablet Take 400-600 mg by mouth every 8 (eight) hours as needed for moderate pain.     Marland Kitchen insulin lispro ([name]) 200 UNIT/ML KwikPen Inject 100 Units into the skin daily. For use in pump; Will provide 30 day supply. (Patient taking differently: Inject 100 Units into the skin See admin instructions. For use in pump; sliding scale) 30 mL 0  . losartan (COZAAR) 50 MG tablet 1 po qd (Patient  taking differently: Take 50 mg by mouth at bedtime. ) 90 tablet 1  . metoprolol succinate (TOPROL-XL) 50 MG 24 hr tablet Take 1 tablet (50 mg total) by mouth daily. Take with or immediately following a meal. (Patient taking differently: Take 50 mg by mouth at bedtime. Take with or immediately following a meal.) 30 tablet 2  . omeprazole (PRILOSEC) 20 MG capsule Take 1 capsule (20 mg total) by mouth daily. (Patient taking differently: Take 20-40 mg by mouth daily as needed (indigestion/acid reflux.). ) 90 capsule 1  . SANTYL ointment Apply 1 application topically every other day. Affected area on leg    . Vitamin D, Ergocalciferol, (DRISDOL) 1.25 MG (50000 UNIT) CAPS capsule 1 tab every Wednesday and Sunday (Patient taking differently: Take 50,000 Units by mouth 2 (two) times a week. 1 tab every Wednesday and Sunday) 24 capsule 6  . BAYER MICROLET LANCETS lancets Use as instructed to check 3 times daily. 100 each 12  . Blood Glucose Monitoring Suppl (ONE TOUCH ULTRA SYSTEM KIT) w/Device KIT 1 kit by Does not apply route once. Check blood sugar 3 times daily. Diagnosis Diabetes ICD-10 E11.8    . Continuous Blood Gluc Receiver ([place]) [place] 1 each by Does not apply route See admin instructions. For use with continuous glucose monitoring system; E11.9 1 each 0  . Continuous Blood Gluc Sensor (DEXCOM G6 SENSOR) MISC Use to check blood sugar continuously. Change every 10 days 3 each 0  . Continuous Blood Gluc Transmit (DEXCOM G6 TRANSMITTER) MISC FOR USE WITH CONTINUOUS GLUCOSE MONITORING SYSTEM 1 each 0  . diazepam (VALIUM) 5 MG tablet Take 1 tablet 40 to 60 minutes prior to MRI (Patient not taking: Reported on 10/04/2019) 1 tablet 0  . glucose blood (BAYER CONTOUR NEXT TEST) test strip 1 each by Other route 4 (four) times daily. And lancets 4/day 400 each 3  . Insulin Disposable Pump (OMNIPOD DASH 5 PACK PODS) MISC 1 each by Does not apply route every 3 (three) days. E10.69 10 each 2  . Insulin  Syringes, Disposable, U-100 1 ML MISC 3x daily 50 each 2    No results found for this or any previous visit (from the past 48 hour(s)). No results found.  Review of Systems  Cardiovascular: Negative.   Gastrointestinal: Negative.   Endocrine: Negative.   Genitourinary: Negative.     Temperature 99.6 F (37.6 C), temperature source Oral, resp. rate 18, height 6' (1.829 m), weight (!) 163.3 kg. Physical Exam  Patient has notable compression about the ulnar nerve at the elbow as well as median nerve at the elbow and wrist.  We will plan for release.  He has a chronic ECRB tendinosis with documented abnormality.  I reviewed his nerve studies and pertinent radiologic studies.  We will move forward with surgical algorithm of care as he desires.  He denies other issues regarding his shoulder or neck at present time.  I reviewed this with him in detail.  The patient is alert and oriented in no acute distress. The patient complains of pain in the affected upper extremity.  The patient is noted to have a normal HEENT exam. Lung fields show equal chest expansion and no shortness of breath. Abdomen exam is nontender without distention. Lower extremity examination does not show any fracture dislocation or blood clot symptoms. Pelvis is stable and the neck and back are stable and nontender. Assessment/Plan We will plan for carpal tunnel release, median nerve release at the elbow.  We will also plan for ulnar nerve release and ECRB debridement with partial ostectomy about the elbow right upper extremity.  We are planning surgery for your upper extremity. The risk and benefits of surgery to include risk of bleeding, infection, anesthesia,  damage to normal structures and failure of the surgery to accomplish its intended goals of relieving symptoms and restoring function have been discussed in detail. With this in mind we plan to proceed. I have specifically discussed with the patient the pre-and  postoperative regime and the dos and don'ts and risk and benefits in great detail. Risk and benefits of surgery also include risk of dystrophy(CRPS), chronic nerve pain, failure of the healing process to go onto completion and other inherent risks of surgery The relavent the pathophysiology of the disease/injury process, as well as the alternatives for treatment and postoperative course of action has been discussed in great detail with the patient who desires to proceed.  We will do everything in our power to help you (the patient) restore function to the upper extremity. It is a pleasure to see this patient today.   Willa Frater III, MD 10/22/2019, 11:01 AM

## 2019-10-23 ENCOUNTER — Encounter (HOSPITAL_COMMUNITY): Payer: Self-pay | Admitting: Orthopedic Surgery

## 2019-10-24 ENCOUNTER — Other Ambulatory Visit: Payer: Self-pay | Admitting: Physician Assistant

## 2019-10-24 DIAGNOSIS — E1159 Type 2 diabetes mellitus with other circulatory complications: Secondary | ICD-10-CM

## 2019-10-24 DIAGNOSIS — I10 Essential (primary) hypertension: Secondary | ICD-10-CM

## 2019-10-24 DIAGNOSIS — R009 Unspecified abnormalities of heart beat: Secondary | ICD-10-CM

## 2019-10-24 DIAGNOSIS — I152 Hypertension secondary to endocrine disorders: Secondary | ICD-10-CM

## 2019-10-28 DIAGNOSIS — M25521 Pain in right elbow: Secondary | ICD-10-CM | POA: Diagnosis not present

## 2019-10-29 ENCOUNTER — Other Ambulatory Visit: Payer: BC Managed Care – PPO

## 2019-10-30 ENCOUNTER — Other Ambulatory Visit: Payer: Self-pay | Admitting: Endocrinology

## 2019-10-30 DIAGNOSIS — E1065 Type 1 diabetes mellitus with hyperglycemia: Secondary | ICD-10-CM

## 2019-10-30 DIAGNOSIS — E1051 Type 1 diabetes mellitus with diabetic peripheral angiopathy without gangrene: Secondary | ICD-10-CM

## 2019-10-30 MED ORDER — OMNIPOD DASH PODS (GEN 4) MISC: 1.0000 | 2 refills | Status: DC

## 2019-10-30 MED ORDER — HUMALOG KWIKPEN 200 UNIT/ML ~~LOC~~ SOPN: 100.0000 [IU] | PEN_INJECTOR | SUBCUTANEOUS | 0 refills | Status: DC

## 2019-10-30 NOTE — Progress Notes (Signed)
KeyFreddy Jaksch - Rx #: [id number] Need help? Call us at [phone] Status Additional Information Required Drug [name] 200UNIT/ML pen-injectors Form [employer] account executive Form (CB) Original Claim Info 70 NDC Not Covered

## 2019-10-30 NOTE — Telephone Encounter (Signed)
Refills updated 

## 2019-10-30 NOTE — Progress Notes (Signed)
Key: [id number] - Rx #: [id number] Need help? Call us at [phone] Status Additional Information Required Drug OmniPod Dash 5 Pack Pods Form [employer] [place] Commercial Electronic Request Form (CB) Original Claim Info 75 PA REQUIRED--QUANTITY LIMIT MAY APPLY

## 2019-10-30 NOTE — Telephone Encounter (Signed)
Medication Refill Request  Did you call your pharmacy and request this refill first? Yes - patient said his pharmacy told him they need a PA for both of these prescriptions   If patient has not contacted pharmacy first, instruct them to do so for future refills.   Remind them that contacting the pharmacy for their refill is the quickest method to get the refill.   Refill policy also stated that it will take anywhere between 24-72 hours to receive the refill.   Name of medication? humalog U 200 and omnipod dash pods  Name and location of pharmacy?   HUMALOG NEED TO GO TO: [facility] - [place], [place] - [address] Phone:  [phone]  Fax:  [phone]      DASH PODS NEED TO GO TO: [facility] [id number] - [place], [place] - [address]  [address], [place] [address]  Phone:  [phone] Fax:  [phone]   --- Patient also wanted to transfer Dr's and its already been approved, but before I knew that, I informed him Dr Everardo All would probably not be able to fill Rx without an appointment scheduled - due to the fact he has not been seen since Feb of this year. So we scheduled a follow up appointment for Nov 2. ---

## 2019-10-31 ENCOUNTER — Telehealth: Payer: Self-pay | Admitting: Endocrinology

## 2019-10-31 NOTE — Telephone Encounter (Signed)
[facility] called stating the PA for Humalog was approved and they faxed that to [place] yesterday. FYI

## 2019-11-01 ENCOUNTER — Other Ambulatory Visit: Payer: Self-pay | Admitting: Endocrinology

## 2019-11-01 DIAGNOSIS — E1051 Type 1 diabetes mellitus with diabetic peripheral angiopathy without gangrene: Secondary | ICD-10-CM

## 2019-11-06 DIAGNOSIS — M25521 Pain in right elbow: Secondary | ICD-10-CM | POA: Diagnosis not present

## 2019-11-12 ENCOUNTER — Ambulatory Visit: Payer: BC Managed Care – PPO | Admitting: Endocrinology

## 2019-11-12 DIAGNOSIS — E1065 Type 1 diabetes mellitus with hyperglycemia: Secondary | ICD-10-CM

## 2019-11-12 NOTE — Progress Notes (Deleted)
Patient no-showed today's appointment; provider notified for review of record, unable to reach patient to reschedule.

## 2019-11-20 ENCOUNTER — Other Ambulatory Visit: Payer: Self-pay | Admitting: Physician Assistant

## 2019-11-20 DIAGNOSIS — E1065 Type 1 diabetes mellitus with hyperglycemia: Secondary | ICD-10-CM

## 2019-11-25 ENCOUNTER — Telehealth: Payer: Self-pay | Admitting: Physician Assistant

## 2019-11-25 DIAGNOSIS — E1065 Type 1 diabetes mellitus with hyperglycemia: Secondary | ICD-10-CM

## 2019-11-25 NOTE — Addendum Note (Signed)
Addended by: Sylvester Harder on: 11/25/2019 10:44 AM   Modules accepted: Orders

## 2019-11-25 NOTE — Telephone Encounter (Signed)
Referral for new endo has been placed.   Patient rescheduled appointment for last endo office and then NS appointment. They refuse to reschedule him. AS, CMA

## 2019-11-25 NOTE — Telephone Encounter (Signed)
Patient missed his referral that was placed on 11/2. It was for diabetes I believe he said. He would like a new referral as they won't see him since he missed his referral. Thank you

## 2019-12-20 ENCOUNTER — Other Ambulatory Visit: Payer: Self-pay | Admitting: Physician Assistant

## 2019-12-20 DIAGNOSIS — E1065 Type 1 diabetes mellitus with hyperglycemia: Secondary | ICD-10-CM

## 2019-12-25 DIAGNOSIS — G894 Chronic pain syndrome: Secondary | ICD-10-CM | POA: Diagnosis not present

## 2019-12-25 DIAGNOSIS — M25511 Pain in right shoulder: Secondary | ICD-10-CM | POA: Diagnosis not present

## 2019-12-27 ENCOUNTER — Telehealth: Payer: Self-pay | Admitting: Cardiovascular Disease

## 2019-12-27 ENCOUNTER — Telehealth: Payer: Self-pay | Admitting: *Deleted

## 2019-12-27 NOTE — Telephone Encounter (Signed)
Decline for me please  

## 2019-12-27 NOTE — Telephone Encounter (Signed)
The echo was ordered as the part of the patients preop cardiovascular examination back in July 2021. He did complete a lexiscan stress test at that time that was normal and he was cleared for sx from a cardiac stand point by Dr. Kirke Corin. He f/u is prn.

## 2019-12-27 NOTE — Telephone Encounter (Signed)
Patient dismissal form--sign by Dr. Everardo All and given to [facility]

## 2019-12-27 NOTE — Telephone Encounter (Signed)
Ok

## 2019-12-27 NOTE — Telephone Encounter (Signed)
Pt requesting to change provider Dr. Everardo All to Dr. Lonzo Cloud. Please advise

## 2019-12-27 NOTE — Telephone Encounter (Signed)
Notified pt the office received the Disability form-pt only being seen for diabetes and not disable for that reason per Dr. Everardo All. Pt understood without questions.

## 2019-12-27 NOTE — Telephone Encounter (Signed)
Order for echo in scheduling wq   Is this still needed ?

## 2019-12-30 ENCOUNTER — Other Ambulatory Visit: Payer: Self-pay | Admitting: Physician Assistant

## 2019-12-30 DIAGNOSIS — E1159 Type 2 diabetes mellitus with other circulatory complications: Secondary | ICD-10-CM

## 2019-12-30 DIAGNOSIS — I152 Hypertension secondary to endocrine disorders: Secondary | ICD-10-CM

## 2019-12-31 ENCOUNTER — Telehealth: Payer: Self-pay

## 2019-12-31 NOTE — Telephone Encounter (Signed)
Please call pt to schedule appt.  No further refills until pt is seen.  T. Areta Terwilliger, CMA  

## 2019-12-31 NOTE — Telephone Encounter (Signed)
Patient scheduled for 01/23/19

## 2020-01-01 ENCOUNTER — Telehealth: Payer: Self-pay | Admitting: Endocrinology

## 2020-01-01 NOTE — Telephone Encounter (Signed)
Patient dismissed from [facility] by Romero Belling, MD, effective 12/27/19. Dismissal Letter sent out by 1st class mail. [employer]

## 2020-01-02 DIAGNOSIS — G35 Multiple sclerosis: Secondary | ICD-10-CM | POA: Diagnosis not present

## 2020-01-06 NOTE — Progress Notes (Signed)
Update:  Below is a recap for Michael Hutchinson and his dates of attempted infusion.  . The initial start of care was set with [name] RN for 09/27/19 and the second infusion was set for 10/11/19.  As you know the first infusion is broken up into two infusions approx. two weeks apart. . However, on 9/16 the patient then changed the dates to 10/04/19 and 10/18/19 . Late September the patient called and reported the need to quarantine due to having Covid-19, also having surgery and wanted to wait till after the surgery set for 10/22/19. Patient was to contact [facility] after speaking to his surgeon. . 12/17/19 Patient called [facility] to set up infusion . 12/27/19 verified fist infusion to take place on 01/02/20 and second 01/18/20 then every six months . 12/23 attempted infusion, RN PIV placement unsuccessful after two attempts due to poor venous access/pt dehydrated.  Nurse to check availability to infuse at later date, educated on how to hydrate . New infusion date is set for 01/17/20 and second 02/01/20

## 2020-01-06 NOTE — Progress Notes (Signed)
Refill Status: Call Completed Called On: 01/06/2020 Contact With: nursing Carollee Herter Little Scheduled Delivery Date: 01/08/2020 Scheduled Infusion Date: 01/17/2020 Deliver To: [address] [place], [place] [address] New Medications: No Change New Drug Allergies: No Change Current Infections: No Change Insurance Changes: No Change Weight Changes: No Change Quarterly Assessment: Not Required SUPPLIES ONLY: INFUSION WAS POSTPONED DUE TO PT DEHYDRATED. PLEASE SHIP x4 24GA x 1/2" ANGOCATH IV START NEEDLES BEFORE NID*  SECOND INFUSION FOR RAMP UP ON 02/01/20. PT CONTACT WILL BE MADE BY PN PRIOR TO ENSURE PT HAS ALL SUPPLIES AND MEDICATION AND TOLERATED INFUSION ON 01/17/2020.

## 2020-01-07 ENCOUNTER — Other Ambulatory Visit: Payer: Self-pay | Admitting: Endocrinology

## 2020-01-07 DIAGNOSIS — E1065 Type 1 diabetes mellitus with hyperglycemia: Secondary | ICD-10-CM

## 2020-01-17 DIAGNOSIS — G35 Multiple sclerosis: Secondary | ICD-10-CM | POA: Diagnosis not present

## 2020-01-20 ENCOUNTER — Telehealth: Payer: Self-pay | Admitting: Neurology

## 2020-01-20 NOTE — Telephone Encounter (Signed)
Tried calling pt to see how he is doing today after his infusion and the reaction he had. No answer. LMOVM to call us back.

## 2020-01-22 NOTE — Telephone Encounter (Signed)
Called patient back and he said, "I ended up with hives all over my body and the infusion was stopped early. I haven't had any other problems since then. I'd just like to know what the next step is?"

## 2020-01-22 NOTE — Telephone Encounter (Signed)
Please advise 

## 2020-01-23 ENCOUNTER — Ambulatory Visit (INDEPENDENT_AMBULATORY_CARE_PROVIDER_SITE_OTHER): Payer: BC Managed Care – PPO | Admitting: Physician Assistant

## 2020-01-23 ENCOUNTER — Encounter: Payer: Self-pay | Admitting: Physician Assistant

## 2020-01-23 VITALS — Ht 72.0 in | Wt 370.0 lb

## 2020-01-23 DIAGNOSIS — G35 Multiple sclerosis: Secondary | ICD-10-CM

## 2020-01-23 DIAGNOSIS — Z89512 Acquired absence of left leg below knee: Secondary | ICD-10-CM

## 2020-01-23 DIAGNOSIS — E1065 Type 1 diabetes mellitus with hyperglycemia: Secondary | ICD-10-CM | POA: Diagnosis not present

## 2020-01-23 DIAGNOSIS — E559 Vitamin D deficiency, unspecified: Secondary | ICD-10-CM

## 2020-01-23 DIAGNOSIS — E1159 Type 2 diabetes mellitus with other circulatory complications: Secondary | ICD-10-CM

## 2020-01-23 DIAGNOSIS — I152 Hypertension secondary to endocrine disorders: Secondary | ICD-10-CM

## 2020-01-23 MED ORDER — DEXCOM G6 RECEIVER DEVI: 1.0000 | 1 refills | Status: DC

## 2020-01-23 MED ORDER — DEXCOM G6 SENSOR MISC: 2 refills | Status: DC

## 2020-01-23 MED ORDER — DEXCOM G6 TRANSMITTER MISC: 1 refills | Status: DC

## 2020-01-23 MED ORDER — LOSARTAN POTASSIUM 50 MG PO TABS: ORAL_TABLET | ORAL | 0 refills | Status: DC

## 2020-01-23 MED ORDER — METOPROLOL SUCCINATE ER 100 MG PO TB24: 100.0000 mg | ORAL_TABLET | Freq: Every day | ORAL | 0 refills | Status: DC

## 2020-01-23 NOTE — Progress Notes (Signed)
Telehealth office visit note for Michael Reid, PA-C- at [facility]   I connected with current patient today by telephone and verified that I am speaking with the correct person   . Location of the patient: Home . Location of the provider: Office - This visit type was conducted due to national recommendations for restrictions regarding the COVID-19 Pandemic (e.g. social distancing) in an effort to limit this patient's exposure and mitigate transmission in our community.    - No physical exam could be performed with this format, beyond that communicated to [place] by the patient/ family members as noted.   - Additionally my office staff/ schedulers were to discuss with the patient that there may be a monetary charge related to this service, depending on their medical insurance.  My understanding is that patient understood and consented to proceed.     _________________________________________________________________________________   History of Present Illness: Patient calls in to follow up on diabetes mellitus and hypertension.   HTN: Pt denies chest pain, dizziness or feeling lightheaded. Has chronic right leg swelling. Reports has started noticing BP increasing and has been consistently in 150s/86-89. Pulse averages from 105-110. Taking medication as directed without side effects. Patient follows a low salt diet.  Diabetes mellitus: Pt denies increased urination or thirst. Pt reports medication compliance. Has been trying to get established with a new endocrinologist and has not been able to.  States his mother goes to endocrinology office in [place] with [facility].  Feels like insulin pump is not working properly because his sugar fluctuates significantly, which he has tried to explain to endocrinologist.  Weight: Patient has made several attempts for > 1 year to lose weight. He tried the Lose It App, diet changes by reducing fried foods and sodas, and  endocrinologist placed him on Farxiga (01/2019) for diabetes and weight loss which he did not tolerate well.         GAD 7 : Generalized Anxiety Score 06/05/2018  Nervous, Anxious, on Edge 0  Control/stop worrying 0  Worry too much - different things 0  Trouble relaxing 0  Restless 0  Easily annoyed or irritable 0  Afraid - awful might happen 0  Total GAD 7 Score 0    Depression screen [facility] 2/9 01/23/2020 10/04/2019 06/05/2018 11/22/2017 10/09/2017  Decreased Interest 0 0 0 1 1  Down, Depressed, Hopeless 0 0 0 0 0  PHQ - 2 Score 0 0 0 1 1  Altered sleeping $RemoveBeforeDE'1 1 2 'tixGJedbKxXULvi$ 0 0  Tired, decreased energy 1 1 0 1 1  Change in appetite 0 0 0 0 0  Feeling bad or failure about yourself  0 0 0 0 0  Trouble concentrating 0 0 0 1 0  Moving slowly or fidgety/restless 0 0 0 0 0  Suicidal thoughts 0 0 0 0 0  PHQ-9 Score $RemoveBef'2 2 2 3 2  'cxiPnSEGtt$ Difficult doing work/chores Not difficult at all Not difficult at all - - Not difficult at all  Some recent data might be hidden      Impression and Recommendations:     1. Type 1 diabetes mellitus with hyperglycemia (HCC)   2. Hypertension associated with diabetes ([place])   3. Multiple sclerosis (HCC)   4. Obesity, Class III, BMI 40-49.9 (morbid obesity) ([place])   5. Hx of BKA, left - with subsequent cellulitis and treatment for osteomylitis     Type 1 diabetes mellitus with hyperglycemia: -Will place new endocrinology  referral (external). Patient needs to get established with endocrinology to improve diabetes mellitus.  Last A1c 7.5, above goal.  -Recommend to continue current medication regimen and ambulatory glucose monitoring. Provided refills for Dexcom. -Continue to monitor carbohydrates and glucose. -Advised to schedule lab visit within the next few weeks for fasting blood work including A1c.  Hypertension associated with diabetes: -Ambulatory BP and pulse are above goal so we will increase metoprolol to 100 mg.  Advised to continue ambulatory BP and pulse  monitoring and to notify the clinic if BP continues to be consistently >135/85 and pulse >100 for further medication adjustments.  Patient verbalized understanding. -Continue low-sodium diet and stay well-hydrated. -Will repeat CMP with lab visit.  Multiple sclerosis: -Followed by neurology.  Obesity, class III, BMI 40-49.9 (morbid obesity): -Encouraged to continue with weight loss efforts with dietary changes.  Recommend to consider enrolling in a weight loss program such as weight watchers. Had a thorough discussion with patient will likely benefit from consultation for bariatric surgery.    Hx of BKA, left: -Followed by Orthopedics.   Vitamin D deficiency: -Last Vit D 45.6, due for repeat lab check. -On Vitamin D 50,000 units twice weekly. -Pending Vit D results will make adjustment to treatment therapy if indicated.    - As part of my medical decision making, I reviewed the following data within the [place] History obtained from pt /family, CMA notes reviewed and incorporated if applicable, Labs reviewed, Radiograph/ tests reviewed if applicable and OV notes from prior OV's with me, as well as any other specialists she/he has seen since seeing me last, were all reviewed and used in my medical decision making process today.    - Additionally, when appropriate, discussion had with patient regarding our treatment plan, and their biases/concerns about that plan were used in my medical decision making today.    - The patient agreed with the plan and demonstrated an understanding of the instructions.   No barriers to understanding were identified.     - The patient was advised to call back or seek an in-person evaluation if the symptoms worsen or if the condition fails to improve as anticipated.   Return in about 3 months (around 04/22/2020) for DM, HTN, Wt; lab visit for FBW including Vit D in 2-6 weeks .    Orders Placed This Encounter  Procedures  . Ambulatory  referral to Endocrinology    Meds ordered this encounter  Medications  . Continuous Blood Gluc Receiver ([place]) DEVI    Sig: 1 each by Does not apply route See admin instructions. For use with continuous glucose monitoring system; E11.9    Dispense:  1 each    Refill:  1    Order Specific Question:   Supervising Provider    Answer:   Beatrice Lecher D [[id number]]  . Continuous Blood Gluc Sensor (DEXCOM G6 SENSOR) MISC    Sig: Use to check blood sugar continuously. Change every 10 days.    Dispense:  3 each    Refill:  2    Order Specific Question:   Supervising Provider    Answer:   Beatrice Lecher D [[id number]]  . Continuous Blood Gluc Transmit (DEXCOM G6 TRANSMITTER) MISC    Sig: FOR USE WITH CONTINUOUS GLUCOSE MONITORING SYSTEM    Dispense:  1 each    Refill:  1    Order Specific Question:   Supervising Provider    Answer:   Beatrice Lecher D [[id number]]  .  metoprolol succinate (TOPROL-XL) 100 MG 24 hr tablet    Sig: Take 1 tablet (100 mg total) by mouth daily. Take with or immediately following a meal.    Dispense:  90 tablet    Refill:  0    Order Specific Question:   Supervising Provider    Answer:   Beatrice Lecher D [[id number]]  . losartan (COZAAR) 50 MG tablet    Sig: TAKE 1 TABLET BY MOUTH DAILY.    Dispense:  90 tablet    Refill:  0    Order Specific Question:   Supervising Provider    Answer:   Beatrice Lecher D [[id number]]    Medications Discontinued During This Encounter  Medication Reason  . doxycycline (VIBRAMYCIN) 100 MG capsule Completed Course  . metoprolol succinate (TOPROL-XL) 50 MG 24 hr tablet Dose change  . Continuous Blood Gluc Receiver ([place]) [place] Reorder  . Continuous Blood Gluc Transmit (DEXCOM G6 TRANSMITTER) MISC Reorder  . Continuous Blood Gluc Sensor (DEXCOM G6 SENSOR) MISC Reorder  . losartan (COZAAR) 50 MG tablet Reorder       Time spent on visit including pre-visit chart review and post-visit care was 25  minutes.      The [place] was signed into law in 2016 which includes the topic of electronic health records.  This provides immediate access to information in MyChart.  This includes consultation notes, operative notes, office notes, lab results and pathology reports.  If you have any questions about what you read please let us know at your next visit or call us at the office.  We are right here with you.   __________________________________________________________________________________     Patient Care Team    Relationship Specialty Notifications Start End  Michael Hutchinson, [place] PCP - General   05/12/19   Fayette Pho, PA Attending Physician Orthopedic Surgery  06/01/16   [name], MD Consulting Physician Orthopedic Surgery  07/04/16   Campbell Riches, MD Consulting Physician Infectious Diseases  07/04/16   Pieter Partridge, DO Consulting Physician Neurology  05/29/17   Renato Shin, MD Consulting Physician Endocrinology  05/29/17    Comment: Treatment of diabetes  Philemon Kingdom, MD Consulting Physician Internal Medicine  07/11/17    Comment: Endocrinologist     -Vitals obtained; medications/ allergies reconciled;  personal medical, social, Sx etc.histories were updated by CMA, reviewed by me and are reflected in chart   Patient Active Problem List   Diagnosis Date Noted  . Carpal tunnel syndrome of right wrist 09/09/2019  . Cubital tunnel syndrome 09/09/2019  . Pronator syndrome 09/09/2019  . Cervical spine pain 08/07/2019  . Amputated below knee ([place]) 06/28/2019  . Cellulitis 06/28/2019  . Sacral back pain 12/26/2018  . Lumbar spondylosis 12/26/2018  . Chronic pain syndrome 11/07/2018  . Adhesive capsulitis of right shoulder 07/27/2018  . Tendinosis of right shoulder 04/09/2018  . Pain in joint of right shoulder 02/14/2018  . Burning in the chest 09/20/2017  . Statin intolerance 08/08/2017  . Multiple sclerosis ([place]) 07/11/2017  . Low hemoglobin  07/11/2017  . Lateral epicondylitis of right elbow 03/20/2017  . Pain in joint of right elbow 03/20/2017  . Chronic fatigue 07/04/2016  . Impaired exercise tolerance 07/04/2016  . Acute left otitis media 06/09/2016  . Hx of BKA, left - with subsequent cellulitis and treatment for osteomylitis 06/01/2016  . Abscess of left leg 04/26/2016  . Normocytic anemia 01/01/2016  . Osteomyelitis ([place]) 12/31/2015  . Diabetic ulcer of  left heel associated with type 1 diabetes mellitus, with necrosis of bone ([place]) 12/31/2015  . Ankle wound 12/31/2015  . Chronic osteomyelitis of left ankle with draining sinus ([place])   . Type 1 diabetes, uncontrolled, with Charcot's joint of foot ([place]) 10/12/2015  . Low HDL (under 40) 09/22/2015  . Obesity, Class III, BMI 40-49.9 (morbid obesity) ([place]) 09/22/2015  . H/O noncompliance with medical treatment, presenting hazards to health 09/22/2015  . Hx MRSA infection 09/22/2015  . Vitamin D deficiency 09/22/2015  . Right foot injury, sequela 09/26/2014  . Charcot's joint of foot 11/25/2013  . Hypertension associated with diabetes ([place]) 05/03/2013  . Type I diabetes mellitus ([place]) 05/03/2013  . Body mass index (BMI) of 40.0-44.9 in adult ([place]) 05/03/2013  . Mixed diabetic hyperlipidemia associated with type 1 diabetes mellitus ([place]) 07/26/2012  . Hypercholesterolemia 07/26/2012  . Diabetic ketoacidosis ([place])   . GERD (gastroesophageal reflux disease)   . Meniscus tear   . Epigastric pain 12/10/2011  . Vomiting 12/10/2011  . Problems influencing health status 11/21/2011     Current Meds  Medication Sig  . BAYER MICROLET LANCETS lancets Use as instructed to check 3 times daily.  . Blood Glucose Monitoring Suppl (ONE TOUCH ULTRA SYSTEM KIT) w/Device KIT 1 kit by Does not apply route once. Check blood sugar 3 times daily. Diagnosis Diabetes ICD-10 E11.8  . gabapentin (NEURONTIN) 100 MG capsule Take 100-300 mg by mouth at bedtime as needed (pain.).   Marland Kitchen glucose blood  (BAYER CONTOUR NEXT TEST) test strip 1 each by Other route 4 (four) times daily. And lancets 4/day  . Insulin Disposable Pump (OMNIPOD DASH 5 PACK PODS) MISC REPLACE POD EVERY 72 HOURS  . insulin lispro ([name]) 200 UNIT/ML KwikPen Inject 100 Units into the skin See admin instructions. For use in pump; sliding scale  . Insulin Syringes, Disposable, U-100 1 ML MISC 3x daily  . metoprolol succinate (TOPROL-XL) 100 MG 24 hr tablet Take 1 tablet (100 mg total) by mouth daily. Take with or immediately following a meal.  . omeprazole (PRILOSEC) 20 MG capsule Take 1 capsule (20 mg total) by mouth daily. (Patient taking differently: Take 20-40 mg by mouth daily as needed (indigestion/acid reflux.).)  . oxyCODONE (ROXICODONE) 5 MG immediate release tablet Take 1 tablet (5 mg total) by mouth every 4 (four) hours as needed.  Marland Kitchen SANTYL ointment Apply 1 application topically every other day. Affected area on leg  . Vitamin D, Ergocalciferol, (DRISDOL) 1.25 MG (50000 UNIT) CAPS capsule 1 tab every Wednesday and Sunday (Patient taking differently: Take 50,000 Units by mouth 2 (two) times a week. 1 tab every Wednesday and Sunday)  . [DISCONTINUED] Continuous Blood Gluc Receiver ([place]) [place] 1 each by Does not apply route See admin instructions. For use with continuous glucose monitoring system; E11.9  . [DISCONTINUED] Continuous Blood Gluc Sensor (DEXCOM G6 SENSOR) MISC USE TO CHECK BLOOD SUGAR CONTINUOUSLY. CHANGE EVERY 10 DAYS  . [DISCONTINUED] Continuous Blood Gluc Transmit (DEXCOM G6 TRANSMITTER) MISC FOR USE WITH CONTINUOUS GLUCOSE MONITORING SYSTEM  . [DISCONTINUED] losartan (COZAAR) 50 MG tablet TAKE 1 TABLET BY MOUTH DAILY.  OFFICE VISIT REQUIRED PRIOR TO ANY FURTHER REFILLS OR 90 DAY SUPPLY  . [DISCONTINUED] metoprolol succinate (TOPROL-XL) 50 MG 24 hr tablet TAKE 1 TABLET (50 MG TOTAL) BY MOUTH DAILY. TAKE WITH OR IMMEDIATELY FOLLOWING A MEAL.     Allergies:  Allergies  Allergen  Reactions  . Influenza Virus Vaccine Split Nausea And Vomiting  . Hydrochlorothiazide  Other (See Comments)    Dizziness  . Lisinopril Cough     ROS:  See above HPI for pertinent positives and negatives   Objective:   Height 6' (1.829 m), weight (!) 370 lb (167.8 kg).  (if some vitals are omitted, this means that patient was UNABLE to obtain.) General: A & O * 3; sounds in no acute distress Respiratory: speaking in full sentences, no conversational dyspnea Psych: insight appears good, mood- appears full

## 2020-01-23 NOTE — Telephone Encounter (Signed)
He has a follow up next week.  We can discuss then.

## 2020-01-27 NOTE — Progress Notes (Signed)
Virtual Visit via Video Note The purpose of this virtual visit is to provide medical care while limiting exposure to the novel coronavirus.    Consent was obtained for video visit:  Yes Answered questions that patient had about telehealth interaction:  Yes I discussed the limitations, risks, security and privacy concerns of performing an evaluation and management service by telemedicine. I also discussed with the patient that there may be a patient responsible charge related to this service. The patient expressed understanding and agreed to proceed.  Pt location: Home Physician Location: office Name of referring provider:  Lorrene Reid, PA-C I connected with Harl Favor at patients initiation/request on 01/28/2020 at  3:30 PM EST by video enabled telemedicine application and verified that I am speaking with the correct person using two identifiers. Pt MRN:  [id number] Pt DOB:  02-Nov-1975 Video Participants:  Harl Favor   History of Present Illness:  Michael Hutchinson is a 45 year old right-handed Caucasian man with type 1 diabetes with history of DKA, hypertension, Charcot's joint of foot, left below the knee amputation, hyperlipidemia and GERD who follows up for multiple sclerosis  UPDATE: Current disease modifying therapy:Ocrevus Other current medications:D3 3000 IU daily  Last week, he had his first Ocrevus infusion.  He declined the Benadryl because he doesn't like the way it makes him feel, but still took the methylprednisolone.  About 25 minutes into the infusion, he developed hives all over his body.  The infusion stopped early and he was given benadryl which prevented the rash from progressing. He is doing okay now.   Vision:Vision has cleared. Motor: Sometimes notes tremors in either hand, primarily the right. Sensory: No numbness. Pain: Pain in low back radiating down legs.  Hurts to stand. . Gait: No change Bowel/Bladder: No issues. Fatigue: yes.    Sleep study negative for OSA.   Cognition: no Mood: No depression   HISTORY: In October 2016, he developed a right 6th cranial nerve palsy which was thought to be due to his uncontrolled type 1 diabetes.  In March 2017, he developed blurred vision in the left eye. He saw ophthalmologist Dr. Julian Reil who diagnosed optic neuritis. MRI of the brain and orbits with and without contrast from 05/11/2015 revealed "2 T2hyperintense lesions within the left hemisphere adjacent to the posterior horn of the left lateral ventricle ", a nonspecific finding and without abnormal contrast enhancement.  In March 2019, he developed disconjugate gaze and droopy left eyelid. He saw the eye doctor who diagnosed a left 3rd nerve palsy. He was evaluated in the ED at [facility] on 03/28/2017. CTA of the head was negative for aneurysm. MRI of the brain with and without contrast demonstrated multiple T2/flair hyperintense foci involving the periventricular, deep and subcortical white matter, progressed compared to prior MRI from 2017. In-house neurology believed his findings were more likely due to a diabetic 3rd nerve palsy rather than MS. He was advised to start aspirin 81 mg daily and follow-up with outpatient neurology. He subsequently underwent a work-up for multiple sclerosis. MRI of the cervical spine with and without contrast from 05/04/2017 revealed no cord lesions. He underwent lumbar puncture on 05/19/2017 for CSF analysis which revealed elevated IgG index of 0.79 and 4 oligoclonal bands not present in the corresponding serum. Cell count was 1, elevated protein 157, elevated glucose 100, negative myelin basic protein, ACE 4, Gram stain and culture negative.  Past disease modifying therapy: Tecfidera (GI upset)  Imaging: 05/11/2015 MRI BRAIN W  WO:  Two T2 hyperintense lesions within the left hemisphere adjacent to the posterior horn of the left lateral ventricle. The finding is  nonspecific but can be seen in the setting of chronic microvascular ischemia, a demyelinating process such as multiple sclerosis, vasculitis, complicated migraine headaches, or as the sequelae of a prior infectious or inflammatory process. 05/11/2015 MRI ORBITS W WO:  Within normal limits 03/28/2017 MRI BRAIN & ORBITS W WO:  1. Multiple T2/FLAIR hyperintense foci involving the periventricular, deep, and subcortical white matter of both cerebral hemispheres in a distribution highly suspicious for possible demyelinating disease/multiple sclerosis. Changes have progressed relative to 2017. No evidence for active demyelination.  2. Negative MRI of the orbits. No findings to suggest acute optic neuritis or other abnormality identified. 3. Bilateral mastoid effusions, left greater than right. 05/04/2017 MRI CERVICAL SPINE W WO:  Normal 02/25/2018 MRI BRAIN W WO: No change since the study of March 2019. Multiple foci of abnormal T2 and FLAIR signal within the cerebral hemispheric deep and subcortical white matter, consistent with the clinical diagnosis of multiple sclerosis. No new or progressive lesions. No lesions show contrast enhancement or restricted diffusion.  Past Medical History: Past Medical History:  Diagnosis Date  . Charcot's joint of foot 11/25/2013  . Complication of anesthesia    "I wake up angry" (12/31/2015)  . Diabetes mellitus type 1 ([place]) dx'd 1981  . Diabetic ketoacidosis ([place])   . Essential hypertension 05/03/2013  . GERD (gastroesophageal reflux disease)   . High cholesterol   . Hx MRSA infection    Inner thigh and under arm- healed areas  . Meniscus tear   . Pneumonia    2016 ish  . Shortness of breath    with exertion only    Medications: Outpatient Encounter Medications as of 01/28/2020  Medication Sig  . BAYER MICROLET LANCETS lancets Use as instructed to check 3 times daily.  . Blood Glucose Monitoring Suppl (ONE TOUCH ULTRA SYSTEM KIT) w/Device KIT 1 kit by Does  not apply route once. Check blood sugar 3 times daily. Diagnosis Diabetes ICD-10 E11.8  . Continuous Blood Gluc Receiver ([place]) [place] 1 each by Does not apply route See admin instructions. For use with continuous glucose monitoring system; E11.9  . Continuous Blood Gluc Sensor (DEXCOM G6 SENSOR) MISC Use to check blood sugar continuously. Change every 10 days.  . Continuous Blood Gluc Transmit (DEXCOM G6 TRANSMITTER) MISC FOR USE WITH CONTINUOUS GLUCOSE MONITORING SYSTEM  . gabapentin (NEURONTIN) 100 MG capsule Take 100-300 mg by mouth at bedtime as needed (pain.).   Marland Kitchen glucose blood (BAYER CONTOUR NEXT TEST) test strip 1 each by Other route 4 (four) times daily. And lancets 4/day  . Insulin Disposable Pump (OMNIPOD DASH 5 PACK PODS) MISC REPLACE POD EVERY 72 HOURS  . insulin lispro ([name]) 200 UNIT/ML KwikPen Inject 100 Units into the skin See admin instructions. For use in pump; sliding scale  . Insulin Syringes, Disposable, U-100 1 ML MISC 3x daily  . losartan (COZAAR) 50 MG tablet TAKE 1 TABLET BY MOUTH DAILY.  . methocarbamol (ROBAXIN) 500 MG tablet Take 1 tablet (500 mg total) by mouth 4 (four) times daily.  . metoprolol succinate (TOPROL-XL) 100 MG 24 hr tablet Take 1 tablet (100 mg total) by mouth daily. Take with or immediately following a meal.  . omeprazole (PRILOSEC) 20 MG capsule Take 1 capsule (20 mg total) by mouth daily. (Patient taking differently: Take 20-40 mg by mouth daily as needed (indigestion/acid  reflux.).)  . oxyCODONE (ROXICODONE) 5 MG immediate release tablet Take 1 tablet (5 mg total) by mouth every 4 (four) hours as needed.  Marland Kitchen SANTYL ointment Apply 1 application topically every other day. Affected area on leg  . Vitamin D, Ergocalciferol, (DRISDOL) 1.25 MG (50000 UNIT) CAPS capsule 1 tab every Wednesday and Sunday (Patient taking differently: Take 50,000 Units by mouth 2 (two) times a week. 1 tab every Wednesday and Sunday)  . Glatiramer Acetate 40  MG/ML SOSY INJECT 40 MG (ONE SYRINGE) UNDER THE SKIN (SUBCUTANEOUS INJECTION) ON MONDAY WEDNESDAY AND FRIDAY (Patient not taking: No sig reported)   No facility-administered encounter medications on file as of 01/28/2020.    Allergies: Allergies  Allergen Reactions  . Influenza Virus Vaccine Split Nausea And Vomiting  . Hydrochlorothiazide Other (See Comments)    Dizziness  . Lisinopril Cough    Family History: Family History  Problem Relation Age of Onset  . Other Mother   . Cancer Mother        Breast / Bone  . Heart attack Father   . Hypertension Father   . Hyperlipidemia Father   . Diabetes Other   . Alcohol abuse Sister   . Diabetes Maternal Grandfather   . Stroke Paternal Grandmother   . Alcohol abuse Paternal Grandfather     Social History: Social History   Socioeconomic History  . Marital status: Married    Spouse name: Estill Bamberg  . Number of children: 2  . Years of education: Not on file  . Highest education level: High school graduate  Occupational History  . Occupation: Dealer  Tobacco Use  . Smoking status: Never Smoker  . Smokeless tobacco: Former Systems developer    Types: Snuff, Chew  Vaping Use  . Vaping Use: Never used  Substance and Sexual Activity  . Alcohol use: Yes    Alcohol/week: 12.0 standard drinks    Types: 12 Cans of beer per week  . Drug use: No  . Sexual activity: Yes  Other Topics Concern  . Not on file  Social History Narrative   Pt is a right handed married man, lives with his wife and 2 children ages 63 and 25, in a single story house. Pt drinks 6-8, 16oz diet sodas a day.    Social Determinants of Health   Financial Resource Strain: Not on file  Food Insecurity: Not on file  Transportation Needs: Not on file  Physical Activity: Not on file  Stress: Not on file  Social Connections: Not on file  Intimate Partner Violence: Not on file    Observations/Objective:   There were no vitals taken for this visit. No acute distress.  Alert  and oriented.  Speech fluent and not dysarthric.  Language intact.    Assessment and Plan:   Relpasing-remitting multiple sclerosis.  While ischemic cranial nerve palsies are seen in uncontrolled diabetes, given the 3 distinct episodes of various cranial nerve palsies, the appearance of white matter lesions on brain MRI, and CSF analysis demonstrated for oligoclonal bands not present in serum, I suspect MS. He does have significantly elevated CSF protein but does not exhibit any sign of infection. Diabetes may be contributor to this.  1.  We can retry Ocrevus where he is fully premedicated.  However, I would like for him to try an alternative medication for now.  We will try Aubagio.   2.  Check CBC with diff, CMP and vit D and repeat in 6 months.  Will need to have  a hepatic panel checked every month for 6 months. 3.  D3 3000 IU daily 4.  Follow up in 6 months  Follow Up Instructions:    -I discussed the assessment and treatment plan with the patient. The patient was provided an opportunity to ask questions and all were answered. The patient agreed with the plan and demonstrated an understanding of the instructions.   The patient was advised to call back or seek an in-person evaluation if the symptoms worsen or if the condition fails to improve as anticipated.  Metta Clines, DO  CC: Lorrene Reid, PA-C

## 2020-01-28 ENCOUNTER — Other Ambulatory Visit: Payer: Self-pay

## 2020-01-28 ENCOUNTER — Encounter: Payer: Self-pay | Admitting: Neurology

## 2020-01-28 ENCOUNTER — Telehealth (INDEPENDENT_AMBULATORY_CARE_PROVIDER_SITE_OTHER): Payer: BC Managed Care – PPO | Admitting: Neurology

## 2020-01-28 DIAGNOSIS — G35 Multiple sclerosis: Secondary | ICD-10-CM | POA: Diagnosis not present

## 2020-01-28 DIAGNOSIS — E1065 Type 1 diabetes mellitus with hyperglycemia: Secondary | ICD-10-CM

## 2020-01-28 DIAGNOSIS — M5416 Radiculopathy, lumbar region: Secondary | ICD-10-CM

## 2020-01-28 DIAGNOSIS — E10610 Type 1 diabetes mellitus with diabetic neuropathic arthropathy: Secondary | ICD-10-CM | POA: Diagnosis not present

## 2020-01-28 DIAGNOSIS — IMO0002 Reserved for concepts with insufficient information to code with codable children: Secondary | ICD-10-CM

## 2020-01-28 NOTE — Addendum Note (Signed)
Addended by: Leida Lauth on: 01/28/2020 04:35 PM   Modules accepted: Orders

## 2020-02-03 ENCOUNTER — Encounter: Payer: Self-pay | Admitting: Neurology

## 2020-02-03 NOTE — Progress Notes (Signed)
Alvira Philips Key: [facility] help? Call us at [phone] Outcome Approvedon January 21 Effective from 01/29/2020 through 01/27/2021. Drug Aubagio 14MG  tablets Form Blue Form (CB)

## 2020-02-06 ENCOUNTER — Telehealth: Payer: Self-pay

## 2020-02-06 NOTE — Telephone Encounter (Signed)
Telephone call from [facility], Please clarify Infusion.  Per last note pt and Dr.Jaffe agreed to let the pt retry Ocrevus if pt has another reaction even after give premedication pt to stop and start Aubagio.    Advise rep pt ok to do the first dose of 300 mg again since pt was unable to finish the first time. With premedication give before had.   In two weeks if pt did ok on first infusion then he can do the 2 nd dose of 300 mg.

## 2020-02-13 DIAGNOSIS — G35 Multiple sclerosis: Secondary | ICD-10-CM | POA: Diagnosis not present

## 2020-02-24 ENCOUNTER — Telehealth: Payer: Self-pay | Admitting: Neurology

## 2020-02-24 DIAGNOSIS — E1059 Type 1 diabetes mellitus with other circulatory complications: Secondary | ICD-10-CM | POA: Diagnosis not present

## 2020-02-24 DIAGNOSIS — E1065 Type 1 diabetes mellitus with hyperglycemia: Secondary | ICD-10-CM | POA: Diagnosis not present

## 2020-02-24 DIAGNOSIS — E1029 Type 1 diabetes mellitus with other diabetic kidney complication: Secondary | ICD-10-CM | POA: Diagnosis not present

## 2020-02-24 DIAGNOSIS — E1040 Type 1 diabetes mellitus with diabetic neuropathy, unspecified: Secondary | ICD-10-CM | POA: Diagnosis not present

## 2020-02-24 DIAGNOSIS — R809 Proteinuria, unspecified: Secondary | ICD-10-CM | POA: Diagnosis not present

## 2020-02-24 NOTE — Telephone Encounter (Signed)
Patient called and said [facility] needs a new order for his second infusion for Ocrevus sent to them.

## 2020-02-24 NOTE — Telephone Encounter (Signed)
No answer with patient, refaxed [facility] [phone].

## 2020-02-24 NOTE — Telephone Encounter (Signed)
Patient need pre meds first before Ocrevus infusion.

## 2020-02-28 DIAGNOSIS — G35 Multiple sclerosis: Secondary | ICD-10-CM | POA: Diagnosis not present

## 2020-03-04 ENCOUNTER — Encounter: Payer: Self-pay | Admitting: Neurology

## 2020-03-04 NOTE — Progress Notes (Signed)
Alvira Philips Key: [id number] help? Call us at [phone] Outcome Approvedon February 22 Effective from 03/03/2020 through 03/02/2021. Drug Ocrevus 300MG solution Form Janine [facility] [employer] Benefit Electronic Request Form (CB)

## 2020-03-13 DIAGNOSIS — E109 Type 1 diabetes mellitus without complications: Secondary | ICD-10-CM | POA: Diagnosis not present

## 2020-03-13 DIAGNOSIS — Z794 Long term (current) use of insulin: Secondary | ICD-10-CM | POA: Diagnosis not present

## 2020-03-23 ENCOUNTER — Telehealth: Payer: Self-pay | Admitting: Cardiovascular Disease

## 2020-03-23 DIAGNOSIS — R0600 Dyspnea, unspecified: Secondary | ICD-10-CM

## 2020-03-23 NOTE — Telephone Encounter (Signed)
Patient spouse calling  Last year patient no showed for ECHO - would like to know if new order can be placed  Please call to discuss

## 2020-03-24 NOTE — Telephone Encounter (Signed)
New order echo placed.

## 2020-04-07 ENCOUNTER — Ambulatory Visit
Admission: RE | Admit: 2020-04-07 | Discharge: 2020-04-07 | Disposition: A | Discharge status: Home / Self Care | Payer: BC Managed Care – PPO | Source: Ambulatory Visit | Attending: Physician Assistant | Admitting: Physician Assistant

## 2020-04-07 DIAGNOSIS — E042 Nontoxic multinodular goiter: Secondary | ICD-10-CM | POA: Diagnosis not present

## 2020-04-07 DIAGNOSIS — E079 Disorder of thyroid, unspecified: Secondary | ICD-10-CM

## 2020-04-08 ENCOUNTER — Telehealth: Payer: Self-pay | Admitting: Physician Assistant

## 2020-04-08 ENCOUNTER — Other Ambulatory Visit: Payer: Self-pay | Admitting: Physician Assistant

## 2020-04-08 DIAGNOSIS — E041 Nontoxic single thyroid nodule: Secondary | ICD-10-CM

## 2020-04-08 NOTE — Telephone Encounter (Signed)
Please see notes on US imaging report. AS, CMA

## 2020-04-08 NOTE — Telephone Encounter (Signed)
-----   Message from Mayer Masker, [place] sent at 04/07/2020  6:00 PM EDT ----- Please call Michael Hutchinson and notify thyroid ultrasound revealed 5 nodules and 2 out of the 5 meet criteria for fine needle aspiration for biopsy for further evaluation. If patient is agreeable will place order. Ultrasound should be repeated in 1 year to monitor remaining nodules.  Thank you, Kandis Cocking

## 2020-04-08 NOTE — Telephone Encounter (Signed)
Patient's wife called in liking to know more ultrasound results about his thyroid and where to go from there. Please advise, thanks.

## 2020-04-09 ENCOUNTER — Other Ambulatory Visit: Payer: BC Managed Care – PPO

## 2020-04-15 DIAGNOSIS — E1029 Type 1 diabetes mellitus with other diabetic kidney complication: Secondary | ICD-10-CM | POA: Diagnosis not present

## 2020-04-15 DIAGNOSIS — I152 Hypertension secondary to endocrine disorders: Secondary | ICD-10-CM | POA: Diagnosis not present

## 2020-04-15 DIAGNOSIS — E1065 Type 1 diabetes mellitus with hyperglycemia: Secondary | ICD-10-CM | POA: Diagnosis not present

## 2020-04-15 DIAGNOSIS — R809 Proteinuria, unspecified: Secondary | ICD-10-CM | POA: Diagnosis not present

## 2020-04-15 DIAGNOSIS — E1059 Type 1 diabetes mellitus with other circulatory complications: Secondary | ICD-10-CM | POA: Diagnosis not present

## 2020-04-15 DIAGNOSIS — E1040 Type 1 diabetes mellitus with diabetic neuropathy, unspecified: Secondary | ICD-10-CM | POA: Diagnosis not present

## 2020-04-20 ENCOUNTER — Other Ambulatory Visit: Payer: Self-pay | Admitting: Physician Assistant

## 2020-04-20 DIAGNOSIS — I152 Hypertension secondary to endocrine disorders: Secondary | ICD-10-CM

## 2020-04-21 ENCOUNTER — Telehealth: Payer: Self-pay | Admitting: Physician Assistant

## 2020-04-21 NOTE — Telephone Encounter (Signed)
Spoke with patient who denies SOB or difficulty breathing. Pt states he is just "wheezing" sometimes and he thinks that it is because of his weight. Per Anne Ng scheduling patient for follow up and evaluation of symptoms. Patient scheduled for 4/20 (offered sooner but that's what worked for his schedule) Pt advised should he start having SOB or difficulty breathing to go to ER for evaluation and treatment. Patient verbalized understanding. AS, CMA

## 2020-04-21 NOTE — Telephone Encounter (Signed)
Patient's wife called in stating patient feels winded when he walks such as going to the car etc. He is breathing fine other when he is walking. Patient would like an inhaler. Patient has a cold and cough. He is coughing, but more of an allergy cough. He coughs because he is trying to get his breath. [facility] is the pharmacy. Please advise, thanks.   Patient has spoken about gastric bypass before. He is still doing the weight watchers program and it is not working. Patient has gained weight. Patient's wife thinks if he can get the weight off he may no longer need an inhaler. Please advise, thanks.

## 2020-04-28 ENCOUNTER — Other Ambulatory Visit (HOSPITAL_COMMUNITY)
Admission: RE | Admit: 2020-04-28 | Discharge: 2020-04-28 | Disposition: A | Discharge status: Home / Self Care | Payer: BC Managed Care – PPO | Source: Ambulatory Visit | Attending: Student | Admitting: Student

## 2020-04-28 ENCOUNTER — Ambulatory Visit
Admission: RE | Admit: 2020-04-28 | Discharge: 2020-04-28 | Disposition: A | Discharge status: Home / Self Care | Payer: BC Managed Care – PPO | Source: Ambulatory Visit | Attending: Physician Assistant | Admitting: Physician Assistant

## 2020-04-28 DIAGNOSIS — E041 Nontoxic single thyroid nodule: Secondary | ICD-10-CM

## 2020-04-28 DIAGNOSIS — E042 Nontoxic multinodular goiter: Secondary | ICD-10-CM | POA: Diagnosis not present

## 2020-04-29 ENCOUNTER — Other Ambulatory Visit: Payer: Self-pay | Admitting: Physician Assistant

## 2020-04-29 ENCOUNTER — Ambulatory Visit (INDEPENDENT_AMBULATORY_CARE_PROVIDER_SITE_OTHER): Payer: BC Managed Care – PPO | Admitting: Physician Assistant

## 2020-04-29 ENCOUNTER — Other Ambulatory Visit: Payer: Self-pay

## 2020-04-29 ENCOUNTER — Encounter: Payer: Self-pay | Admitting: Physician Assistant

## 2020-04-29 VITALS — BP 147/68 | HR 86 | Temp 98.8°F | Ht 73.0 in | Wt >= 6400 oz

## 2020-04-29 DIAGNOSIS — Z8616 Personal history of COVID-19: Secondary | ICD-10-CM

## 2020-04-29 DIAGNOSIS — R0602 Shortness of breath: Secondary | ICD-10-CM

## 2020-04-29 DIAGNOSIS — I152 Hypertension secondary to endocrine disorders: Secondary | ICD-10-CM

## 2020-04-29 DIAGNOSIS — E1065 Type 1 diabetes mellitus with hyperglycemia: Secondary | ICD-10-CM

## 2020-04-29 DIAGNOSIS — E1159 Type 2 diabetes mellitus with other circulatory complications: Secondary | ICD-10-CM

## 2020-04-29 DIAGNOSIS — Z0000 Encounter for general adult medical examination without abnormal findings: Secondary | ICD-10-CM

## 2020-04-29 DIAGNOSIS — E1069 Type 1 diabetes mellitus with other specified complication: Secondary | ICD-10-CM

## 2020-04-29 DIAGNOSIS — E042 Nontoxic multinodular goiter: Secondary | ICD-10-CM

## 2020-04-29 DIAGNOSIS — E559 Vitamin D deficiency, unspecified: Secondary | ICD-10-CM

## 2020-04-29 MED ORDER — ALBUTEROL SULFATE HFA 108 (90 BASE) MCG/ACT IN AERS: 1.0000 | INHALATION_SPRAY | RESPIRATORY_TRACT | 2 refills | Status: DC | PRN

## 2020-04-29 MED ORDER — AMLODIPINE BESYLATE 5 MG PO TABS: 5.0000 mg | ORAL_TABLET | Freq: Every day | ORAL | 0 refills | Status: DC

## 2020-04-29 NOTE — Progress Notes (Signed)
[facility]  Established Patient Office Visit  Subjective:  Patient ID: Michael Hutchinson, male    DOB: 1975/10/28  Age: 45 y.o. MRN: [id number]  CC:  Chief Complaint  Patient presents with  . Shortness of Breath    HPI Michael Hutchinson presents for follow up on diabetes mellitus, hypertension and has concerns of shortness of breath.  Patient reports shortness of breath started around when he had his COVID infection last September 2021.  Feels like gets short winded easily.  Also reports noticing some wheezing and chest congestion.  Denies fever, cough, chills, chest pain, palpitations or increased lower extremity swelling of right foot from baseline.  Diabetes: Patient has established care with new endocrinologist, Dr. Hartford Poli. Endocrinologist resumed Medtronic 670G insulin pump and Guardian 3 continues glucose sensors.  Patient keeps pump in auto mode.  Was started on metformin ER 500 mg and reports is tolerating medication without significant GI issues as in the past. Pt denies increased urination or thirst. Pt reports sugars range from 130-170 with some low readings at night 60-75.  HTN: Pt denies chest pain, palpitations, dizziness or headache.  Right lower extremity edema has been stable. Taking medication as directed without side effects.  Patient does not check blood pressure at home, has not found an upper arm cuff suitable for his arm size.   Weight: Patient has tried weight watchers, the lose it app and Mediterranean diet to help with weight loss but has been unsuccessful.  Is interested in bariatric surgery.    Past Medical History:  Diagnosis Date  . Charcot's joint of foot 11/25/2013  . Complication of anesthesia    "I wake up angry" (12/31/2015)  . Diabetes mellitus type 1 ([place]) dx'd 1981  . Diabetic ketoacidosis ([place])   . Essential hypertension 05/03/2013  . GERD (gastroesophageal reflux disease)   . High cholesterol   . Hx MRSA infection    Inner thigh and under arm- healed areas   . Meniscus tear   . Pneumonia    2016 ish  . Shortness of breath    with exertion only    Past Surgical History:  Procedure Laterality Date  . AMPUTATION Left 01/01/2016   Procedure: AMPUTATION BELOW KNEE;  Surgeon: [name], MD;  Location: [facility];  Service: Orthopedics;  Laterality: Left;  . APPLICATION OF WOUND VAC  04/26/2016  . CARPAL TUNNEL WITH CUBITAL TUNNEL Right 10/22/2019   Procedure: Right carpal tunnel release, right cubital tunnel release in situ, Right pronator/proximal median nerve release, Right elbow ECRB debridement with partial ostectomy and repair as necessary;  Surgeon: [name], MD;  Location: [facility];  Service: Orthopedics;  Laterality: Right;  156mn  . HERNIA REPAIR    . I & D EXTREMITY Left 04/26/2016   Procedure: IRRIGATION AND DEBRIDEMENT EXTREMITY/Left Leg/Possible Wound Vac;  Surgeon: JWylene Simmer MD;  Location: [facility]  Service: Orthopedics;  Laterality: Left;  . INCISE AND DRAIN ABCESS Left 04/26/2016  . KNEE ARTHROSCOPY Left ~ 2010  . LAPAROSCOPIC CHOLECYSTECTOMY  2015  . METACARPOPHALANGEAL JOINT ARTHRODESIS Left 06/2012   Fracture left index finger intra-articular MCP joint/notes 06/30/2012  . OPEN REDUCTION INTERNAL FIXATION (ORIF) PROXIMAL PHALANX Left 06/30/2012   Procedure: OPEN REDUCTION INTERNAL FIXATION (ORIF) LEFT INDEX FINGER PROXIMAL PHALANX FRACTURE WITH LIGAMENT REPAIR AS NECESSARY;  Surgeon: WRoseanne Kaufman MD;  Location: [facility]  Service: Orthopedics;  Laterality: Left;  . UMBILICAL HERNIA REPAIR  2015   "w/gallbladder OR"    Family History  Problem Relation  Age of Onset  . Other Mother   . Cancer Mother        Breast / Bone  . Heart attack Father   . Hypertension Father   . Hyperlipidemia Father   . Diabetes Other   . Alcohol abuse Sister   . Diabetes Maternal Grandfather   . Stroke Paternal Grandmother   . Alcohol abuse Paternal Grandfather     Social History   Socioeconomic History  . Marital status: Married     Spouse name: Estill Bamberg  . Number of children: 2  . Years of education: Not on file  . Highest education level: High school graduate  Occupational History  . Occupation: Dealer  Tobacco Use  . Smoking status: Never Smoker  . Smokeless tobacco: Former Systems developer    Types: Snuff, Chew  Vaping Use  . Vaping Use: Never used  Substance and Sexual Activity  . Alcohol use: Yes    Alcohol/week: 12.0 standard drinks    Types: 12 Cans of beer per week  . Drug use: No  . Sexual activity: Yes  Other Topics Concern  . Not on file  Social History Narrative   Pt is a right handed married man, lives with his wife and 2 children ages 52 and 45, in a single story house. Pt drinks 6-8, 16oz diet sodas a day.    Social Determinants of Health   Financial Resource Strain: Not on file  Food Insecurity: Not on file  Transportation Needs: Not on file  Physical Activity: Not on file  Stress: Not on file  Social Connections: Not on file  Intimate Partner Violence: Not on file    Outpatient Medications Prior to Visit  Medication Sig Dispense Refill  . BAYER MICROLET LANCETS lancets Use as instructed to check 3 times daily. 100 each 12  . Blood Glucose Monitoring Suppl (ONE TOUCH ULTRA SYSTEM KIT) w/Device KIT 1 kit by Does not apply route once. Check blood sugar 3 times daily. Diagnosis Diabetes ICD-10 E11.8    . gabapentin (NEURONTIN) 100 MG capsule Take 100-300 mg by mouth at bedtime as needed (pain.).     Marland Kitchen Glatiramer Acetate 40 MG/ML SOSY INJECT 40 MG (ONE SYRINGE) UNDER THE SKIN (SUBCUTANEOUS INJECTION) ON MONDAY WEDNESDAY AND FRIDAY 12 mL 0  . glucose blood (BAYER CONTOUR NEXT TEST) test strip 1 each by Other route 4 (four) times daily. And lancets 4/day 400 each 3  . Insulin Disposable Pump (OMNIPOD DASH 5 PACK PODS) MISC REPLACE POD EVERY 72 HOURS 30 each 2  . Insulin Syringes, Disposable, U-100 1 ML MISC 3x daily 50 each 2  . losartan (COZAAR) 50 MG tablet TAKE 1 TABLET BY MOUTH DAILY. 90 tablet 0  .  methocarbamol (ROBAXIN) 500 MG tablet Take 1 tablet (500 mg total) by mouth 4 (four) times daily. 40 tablet 0  . metoprolol succinate (TOPROL-XL) 100 MG 24 hr tablet TAKE 1 TABLET BY MOUTH DAILY. TAKE WITH OR IMMEDIATELY FOLLOWING A MEAL. 90 tablet 0  . SANTYL ointment Apply 1 application topically every other day. Affected area on leg    . Vitamin D, Ergocalciferol, (DRISDOL) 1.25 MG (50000 UNIT) CAPS capsule 1 tab every Wednesday and Sunday (Patient taking differently: Take 50,000 Units by mouth 2 (two) times a week. 1 tab every Wednesday and Sunday) 24 capsule 6  . Continuous Blood Gluc Receiver ([place]) [place] 1 each by Does not apply route See admin instructions. For use with continuous glucose monitoring system; E11.9 (Patient not  taking: Reported on 04/29/2020) 1 each 1  . Continuous Blood Gluc Sensor (DEXCOM G6 SENSOR) MISC Use to check blood sugar continuously. Change every 10 days. (Patient not taking: Reported on 04/29/2020) 3 each 2  . Continuous Blood Gluc Transmit (DEXCOM G6 TRANSMITTER) MISC FOR USE WITH CONTINUOUS GLUCOSE MONITORING SYSTEM (Patient not taking: Reported on 04/29/2020) 1 each 1  . insulin lispro ([name]) 200 UNIT/ML KwikPen Inject 100 Units into the skin See admin instructions. For use in pump; sliding scale (Patient not taking: Reported on 04/29/2020) 9 mL 0  . omeprazole (PRILOSEC) 20 MG capsule Take 1 capsule (20 mg total) by mouth daily. (Patient not taking: Reported on 04/29/2020) 90 capsule 1  . oxyCODONE (ROXICODONE) 5 MG immediate release tablet Take 1 tablet (5 mg total) by mouth every 4 (four) hours as needed. (Patient not taking: Reported on 04/29/2020) 40 tablet 0   No facility-administered medications prior to visit.    Allergies  Allergen Reactions  . Influenza Virus Vaccine Split Nausea And Vomiting  . Influenza Virus Vaccine Split Nausea And Vomiting  . Hydrochlorothiazide Other (See Comments)    Dizziness  . Lisinopril Cough     ROS Review of Systems A fourteen system review of systems was performed and found to be positive as per HPI.    Objective:    Physical Exam General:  Pleasant and cooperative, in no acute distress  Neuro:  Alert and oriented,  extra-ocular muscles intact  HEENT:  Normocephalic, atraumatic, neck supple Skin:  no gross rash, warm, pink. Cardiac:  RRR, S1 S2, distant heart sounds due to body habitus  Respiratory: Mild scattered rhonchi noted, no wheezing, crackles or rales. Not using accessory muscles, speaking in full sentences- unlabored. Vascular:  Ext warm, no cyanosis apprec.; cap RF less 2 sec. +edema  Psych:  No HI/SI, judgement and insight good, Euthymic mood. Full Affect.   BP (!) 147/68   Pulse 86   Temp 98.8 F (37.1 C)   Ht _0  (1.854 m)   Wt (!) 433 lb (196.4 kg)   SpO2 96%   BMI 57.13 kg/m  Wt Readings from Last 3 Encounters:  04/29/20 (!) 433 lb (196.4 kg)  01/23/20 (!) 370 lb (167.8 kg)  10/22/19 (!) 360 lb (163.3 kg)     Health Maintenance Due  Topic Date Due  . OPHTHALMOLOGY EXAM  Never done  . TETANUS/TDAP  01/12/2015    There are no preventive care reminders to display for this patient.  Lab Results  Component Value Date   TSH 1.600 09/06/2019   Lab Results  Component Value Date   WBC 5.3 10/22/2019   HGB 13.5 10/22/2019   HCT 41.0 10/22/2019   MCV 93.6 10/22/2019   PLT 291 10/22/2019   Lab Results  Component Value Date   NA 130 (L) 10/22/2019   K 4.9 10/22/2019   CO2 22 10/22/2019   GLUCOSE 156 (H) 10/22/2019   BUN 19 10/22/2019   CREATININE 1.04 10/22/2019   BILITOT 1.7 (H) 10/22/2019   ALKPHOS 78 10/22/2019   AST 39 10/22/2019   ALT 40 10/22/2019   PROT 6.7 10/22/2019   ALBUMIN 3.6 10/22/2019   CALCIUM 9.2 10/22/2019   ANIONGAP 13 10/22/2019   GFR 73.74 07/02/2018   Lab Results  Component Value Date   CHOL 218 (H) 10/28/2016   Lab Results  Component Value Date   HDL 43 10/28/2016   Lab Results  Component  Value Date   LDLCALC 147 (H)  10/28/2016   Lab Results  Component Value Date   TRIG 140 10/28/2016   Lab Results  Component Value Date   CHOLHDL 5.1 (H) 10/28/2016   Lab Results  Component Value Date   HGBA1C 7.5 (H) 07/03/2019      Assessment & Plan:   Problem List Items Addressed This Visit      Cardiovascular and Mediastinum   Hypertension associated with diabetes ([place]) (Chronic)   Relevant Medications   amLODipine (NORVASC) 5 MG tablet     Endocrine   Type I diabetes mellitus ([facility]) - Primary     Other   Obesity, Class III, BMI 40-49.9 (morbid obesity) ([facility]) (Chronic)   Relevant Orders   Amb Referral to Bariatric Surgery    Other Visit Diagnoses    Shortness of breath       Relevant Medications   albuterol (VENTOLIN HFA) 108 (90 Base) MCG/ACT inhaler   Other Relevant Orders   DG Chest 2 View   History of COVID-19       Relevant Orders   DG Chest 2 View     Type 1 diabetes mellitus: -Followed by Endocrinology. Recent A1c has improved from 8.6 to 7.6. -Continue current medication regimen. -Monitor for hypoglycemia. -Will continue to monitor alongside Endocrinology.  Hypertension associated with diabetes: -BP elevated, BP recheck improved but remains above goal. Patient has med allergies to lisinopril and HCTZ, is agreeable to trial CCB- amlodipine. Discussed most common side effects including peripheral edema and advised to let me know if unable to tolerate med. -Recommend to monitor sodium intake and stay well hydrated. -Will repeat BP when patient returns for fasting blood work.   Shortness of breath: -Discussed with patient potential etiologies including post Covid syndrome. Will place orders for chest xray and patient will schedule lab visit this week for fasting blood work including CMP, BNP, CBC and thyroid panel for further evaluation. -Recommend to use albuterol as needed for shortness of breath/wheezing. -Obesity and deconditioning could possibly be  contributing to symptoms.  Morbid obesity: -Associated with hypertension, hyperlipidemia, and diabetes mellitus. -Patient has failed multiple treatment options and reasonable to consider bariatric surgery so will place referral.  Multiple thyroid nodules: -Pathology report of recent FNA bx (04/28/2020) pending and will notify patient of results once available.  -US thyroid 04/07/2020: IMPRESSION:  1. Nodule 3 (TI-RADS 3) located in the inferior right thyroid lobe  measuring 3.5 x 3.0 x 2.0 cm, meets criteria for FNA.  2. Nodule 5 (TI-RADS 5) located in the inferior left thyroid lobe  measuring 2.5 x 2.1 x 1.8 cm, meets criteria for FNA.  3. Follow-up ultrasound recommended in 1 year to document stability  of remaining nodules.    Meds ordered this encounter  Medications  . albuterol (VENTOLIN HFA) 108 (90 Base) MCG/ACT inhaler    Sig: Inhale 1-2 puffs into the lungs every 4 (four) hours as needed for wheezing or shortness of breath.    Dispense:  8 g    Refill:  2    Order Specific Question:   Supervising Provider    Answer:   Beatrice Lecher D [[id number]]  . amLODipine (NORVASC) 5 MG tablet    Sig: Take 1 tablet (5 mg total) by mouth daily.    Dispense:  30 tablet    Refill:  0    Order Specific Question:   Supervising Provider    Answer:   Beatrice Lecher D [[id number]]    Follow-up: Return in about 3 months (around 07/29/2020)  for DM, HTN, HLD; lab visit Friday for FBW, Vit D, BNP, thyroid panel, and BP recheck .   Note:  This note was prepared with assistance of Dragon voice recognition software. Occasional wrong-word or sound-a-like substitutions may have occurred due to the inherent limitations of voice recognition software.  Lorrene Reid, PA-C

## 2020-04-29 NOTE — Patient Instructions (Signed)

## 2020-04-30 LAB — CYTOLOGY - NON PAP

## 2020-05-01 ENCOUNTER — Other Ambulatory Visit: Payer: Self-pay

## 2020-05-01 ENCOUNTER — Other Ambulatory Visit: Payer: BC Managed Care – PPO

## 2020-05-01 ENCOUNTER — Ambulatory Visit (INDEPENDENT_AMBULATORY_CARE_PROVIDER_SITE_OTHER): Payer: BC Managed Care – PPO

## 2020-05-01 DIAGNOSIS — E1065 Type 1 diabetes mellitus with hyperglycemia: Secondary | ICD-10-CM

## 2020-05-01 DIAGNOSIS — E782 Mixed hyperlipidemia: Secondary | ICD-10-CM | POA: Diagnosis not present

## 2020-05-01 DIAGNOSIS — E1159 Type 2 diabetes mellitus with other circulatory complications: Secondary | ICD-10-CM | POA: Diagnosis not present

## 2020-05-01 DIAGNOSIS — I152 Hypertension secondary to endocrine disorders: Secondary | ICD-10-CM

## 2020-05-01 DIAGNOSIS — E1069 Type 1 diabetes mellitus with other specified complication: Secondary | ICD-10-CM | POA: Diagnosis not present

## 2020-05-01 DIAGNOSIS — Z0000 Encounter for general adult medical examination without abnormal findings: Secondary | ICD-10-CM

## 2020-05-01 DIAGNOSIS — E559 Vitamin D deficiency, unspecified: Secondary | ICD-10-CM | POA: Diagnosis not present

## 2020-05-01 DIAGNOSIS — R0600 Dyspnea, unspecified: Secondary | ICD-10-CM | POA: Diagnosis not present

## 2020-05-01 LAB — ECHOCARDIOGRAM COMPLETE
AR max vel: 4.21 cm2
AV Area VTI: 4.59 cm2
AV Area mean vel: 4.43 cm2
AV Mean grad: 6 mmHg
AV Peak grad: 11.4 mmHg
Ao pk vel: 1.69 m/s
Area-P 1/2: 5.84 cm2

## 2020-05-02 LAB — COMPREHENSIVE METABOLIC PANEL
ALT: 72 IU/L — ABNORMAL HIGH (ref 0–44)
AST: 46 IU/L — ABNORMAL HIGH (ref 0–40)
Albumin/Globulin Ratio: 1.4 (ref 1.2–2.2)
Albumin: 4 g/dL (ref 4.0–5.0)
Alkaline Phosphatase: 111 IU/L (ref 44–121)
BUN/Creatinine Ratio: 11 (ref 9–20)
BUN: 11 mg/dL (ref 6–24)
Bilirubin Total: 0.4 mg/dL (ref 0.0–1.2)
CO2: 22 mmol/L (ref 20–29)
Calcium: 9.1 mg/dL (ref 8.7–10.2)
Chloride: 93 mmol/L — ABNORMAL LOW (ref 96–106)
Creatinine, Ser: 0.96 mg/dL (ref 0.76–1.27)
Globulin, Total: 2.9 g/dL (ref 1.5–4.5)
Glucose: 179 mg/dL — ABNORMAL HIGH (ref 65–99)
Potassium: 4.9 mmol/L (ref 3.5–5.2)
Sodium: 134 mmol/L (ref 134–144)
Total Protein: 6.9 g/dL (ref 6.0–8.5)
eGFR: 101 mL/min/{1.73_m2} (ref >59)

## 2020-05-02 LAB — CBC
Hematocrit: 35.8 % — ABNORMAL LOW (ref 37.5–51.0)
Hemoglobin: 11.8 g/dL — ABNORMAL LOW (ref 13.0–17.7)
MCH: 32.1 pg (ref 26.6–33.0)
MCHC: 33 g/dL (ref 31.5–35.7)
MCV: 97 fL (ref 79–97)
Platelets: 223 10*3/uL (ref 150–450)
RBC: 3.68 x10E6/uL — ABNORMAL LOW (ref 4.14–5.80)
RDW: 12.4 % (ref 11.6–15.4)
WBC: 5.5 10*3/uL (ref 3.4–10.8)

## 2020-05-02 LAB — T3: T3, Total: 112 ng/dL (ref 71–180)

## 2020-05-02 LAB — LIPID PANEL
Chol/HDL Ratio: 4.6 ratio (ref 0.0–5.0)
Cholesterol, Total: 203 mg/dL — ABNORMAL HIGH (ref 100–199)
HDL: 44 mg/dL (ref >39)
LDL Chol Calc (NIH): 129 mg/dL — ABNORMAL HIGH (ref 0–99)
Triglycerides: 167 mg/dL — ABNORMAL HIGH (ref 0–149)
VLDL Cholesterol Cal: 30 mg/dL (ref 5–40)

## 2020-05-02 LAB — VITAMIN D 25 HYDROXY (VIT D DEFICIENCY, FRACTURES): Vit D, 25-Hydroxy: 27.6 ng/mL — ABNORMAL LOW (ref 30.0–100.0)

## 2020-05-02 LAB — T4, FREE: Free T4: 1.18 ng/dL (ref 0.82–1.77)

## 2020-05-02 LAB — TSH: TSH: 2.31 u[IU]/mL (ref 0.450–4.500)

## 2020-05-02 LAB — BRAIN NATRIURETIC PEPTIDE: BNP: 8.6 pg/mL (ref 0.0–100.0)

## 2020-05-04 ENCOUNTER — Telehealth: Payer: Self-pay | Admitting: *Deleted

## 2020-05-04 NOTE — Telephone Encounter (Signed)
-----   Message from Iran Ouch, MD sent at 05/04/2020  7:55 AM EDT ----- Inform patient that echo was fine.  Normal ejection fraction with no significant valvular abnormalities.  Forwarding to primary care physician as an FYI.

## 2020-05-04 NOTE — Telephone Encounter (Signed)
Left voicemail message to call back for review of results.  

## 2020-05-05 ENCOUNTER — Telehealth: Payer: Self-pay | Admitting: Physician Assistant

## 2020-05-05 DIAGNOSIS — E559 Vitamin D deficiency, unspecified: Secondary | ICD-10-CM

## 2020-05-05 MED ORDER — VITAMIN D (ERGOCALCIFEROL) 1.25 MG (50000 UNIT) PO CAPS: 50000.0000 [IU] | ORAL_CAPSULE | ORAL | 6 refills | Status: DC

## 2020-05-05 NOTE — Telephone Encounter (Signed)
Patient made aware of echo results with verbalized understanding. 

## 2020-05-05 NOTE — Telephone Encounter (Signed)
Patient calling to discuss recent testing results  ° °Please call  ° °

## 2020-05-05 NOTE — Telephone Encounter (Signed)
-----   Message from Mayer Masker, [place] sent at 05/04/2020  2:56 PM EDT ----- Please call Mr. Farrior and notify most labs are essentially within normal limits or stable from prior with exception of lipid panel and Vitamin D. Vitamin D is mildly low and recommend to take Vitamin D 50,000 units once a week for 8 weeks and then start taking Vit D3 2000 units daily. If patient is agreeable, please send rx for Vit D and discontinue current rx on med list. Bad cholesterol and triglycerides are mildly elevated and recommend to reduce simple carbohydrates, saturated and trans fats. Liver enzymes are elevated and recommend to avoid liver toxic substances such as Tylenol or alcohol. Will continue to monitor liver function and recommend repeating in 6 weeks. If they remain elevated recommend further evaluation with liver ultrasound. CBC shows hemoglobin and hematocrit are mildly low indicating anemia, recommend further workup with iron panel and retic count and repeating CBC in 6 weeks. Recommend to ensure getting adequate iron through diet. Thyroid labs are normal. Lab test BNP which is to evaluate for possible heart failure is normal.   Thank you,  Kandis Cocking

## 2020-05-06 ENCOUNTER — Telehealth: Payer: Self-pay | Admitting: Physician Assistant

## 2020-05-06 ENCOUNTER — Encounter: Payer: Self-pay | Admitting: Neurology

## 2020-05-06 DIAGNOSIS — R0602 Shortness of breath: Secondary | ICD-10-CM

## 2020-05-06 NOTE — Telephone Encounter (Signed)
I placed order for chest xray and looks like order is still pending and recommend obtaining imaging. Patient recently had echocardiogram which revealed a good ejection fraction (heart pump function is good). Recommend to follow up with cardiology, we can also place an order for Holter monitor to evaluate palpitations, cardiology will likely do additional work up. We can also place referral to pulmonology.

## 2020-05-06 NOTE — Telephone Encounter (Signed)
Left voicemail message to call back for review of any questions.

## 2020-05-06 NOTE — Telephone Encounter (Signed)
Patient is agreeable to [facility] referral. Pt states Cardiologist gave him a "green light" and that he doesn't need further cardiac work up. Pt states she will go get xray tomorrow or Friday. Referral to Pulm has been placed. AS, CMA

## 2020-05-06 NOTE — Telephone Encounter (Signed)
Patient is already established with endocrinologist with [facility]. Advised to discuss ultrasound and pathology report. Treatment for thyroid nodules is usually monitoring and repeating imaging. There are certain situations where starting thyroid medication can help reduce size of thyroid nodules, however, patient's recent thyroid labs are normal which is why I am asking for his endocrinologist recommendation who also specializes with thyroid disorders including nodules. Patient's thyroid levels are normal and starting thyroid medication could potentially create issues with making it overactive.  Thank you, Kandis Cocking

## 2020-05-06 NOTE — Progress Notes (Signed)
Received fax approval for ocrevus at [facility] valid from 03/03/20 to 03/02/21. REF#: [id number]. Scanned into chart.

## 2020-05-06 NOTE — Telephone Encounter (Signed)
Spoke with patients wife per release form and reviewed if she had any questions. She states her husband has had shortness of breath and they wanted to review results. Reviewed echocardiogram results and stress test that was done last year which were normal. She then discussed about his thyroid and previous COVID infection. Reviewed that based on his testing there was no cardiac indications for any persistent symptoms. She states that his primary care provider did order chest xray and they are planning to get that done tomorrow. Advised that she may want to follow up with his primary provider if his symptoms persist and she could always call back if they have any further questions. She verbalized understanding with no further concerns at this time.

## 2020-05-06 NOTE — Telephone Encounter (Signed)
Patient was referred to an endocrinologist and cannot get in until July 13 th. Patient would like to know where he can get treatment before then. Please advise, thanks.

## 2020-05-06 NOTE — Telephone Encounter (Signed)
Pt states he is unable to walk 20 ft without being SOB and is having heart palpitations and tachycardia and extreme fatigue. He is requesting further work up to advise what is causing these issue. AS, CMA

## 2020-05-06 NOTE — Telephone Encounter (Signed)
Patient and patient spouse calling  Wants to clarify results from ECHO and stress test Please call to discuss at [phone]

## 2020-05-06 NOTE — Addendum Note (Signed)
Addended by: Sylvester Harder on: 05/06/2020 03:18 PM   Modules accepted: Orders

## 2020-05-08 ENCOUNTER — Ambulatory Visit
Admission: RE | Admit: 2020-05-08 | Discharge: 2020-05-08 | Disposition: A | Discharge status: Home / Self Care | Payer: BC Managed Care – PPO | Source: Ambulatory Visit | Attending: Physician Assistant | Admitting: Physician Assistant

## 2020-05-08 ENCOUNTER — Telehealth: Payer: Self-pay | Admitting: Physician Assistant

## 2020-05-08 ENCOUNTER — Other Ambulatory Visit: Payer: Self-pay

## 2020-05-08 DIAGNOSIS — R0602 Shortness of breath: Secondary | ICD-10-CM

## 2020-05-08 DIAGNOSIS — Z8616 Personal history of COVID-19: Secondary | ICD-10-CM

## 2020-05-08 NOTE — Telephone Encounter (Signed)
Patient advised that xray is completed. Thank you

## 2020-05-08 NOTE — Telephone Encounter (Signed)
Patient called to inquire where to go for xray per PCP- Provided [facility] information to patient.   Thank you

## 2020-05-11 ENCOUNTER — Telehealth: Payer: Self-pay | Admitting: Physician Assistant

## 2020-05-11 NOTE — Telephone Encounter (Signed)
Dear Mr. Fina,   Your chest x-ray is normal. Both lungs are clear and heart size is within normal range. Recommend proceeding with pulmonology referral for further evaluation of shortness of breath.    Please feel free to contact the office if you have any further questions or concerns.   Warm regards, Mayer Masker, PA-C   Pt is aware of the results and verbalized understanding. AS, CMA

## 2020-05-11 NOTE — Telephone Encounter (Signed)
Patient called and would like a call back for his 4/29 chest xray results. Thank you

## 2020-05-13 ENCOUNTER — Ambulatory Visit: Payer: BC Managed Care – PPO | Admitting: Pulmonary Disease

## 2020-05-13 ENCOUNTER — Other Ambulatory Visit: Payer: Self-pay

## 2020-05-13 ENCOUNTER — Encounter: Payer: Self-pay | Admitting: Pulmonary Disease

## 2020-05-13 VITALS — BP 130/84 | HR 111 | Temp 98.0°F | Ht 73.0 in | Wt >= 6400 oz

## 2020-05-13 DIAGNOSIS — R053 Chronic cough: Secondary | ICD-10-CM

## 2020-05-13 DIAGNOSIS — U099 Post covid-19 condition, unspecified: Secondary | ICD-10-CM

## 2020-05-13 DIAGNOSIS — J4550 Severe persistent asthma, uncomplicated: Secondary | ICD-10-CM

## 2020-05-13 LAB — CBC WITH DIFFERENTIAL/PLATELET
Basophils Absolute: 0 10*3/uL (ref 0.0–0.1)
Basophils Relative: 0.5 % (ref 0.0–3.0)
Eosinophils Absolute: 0.2 10*3/uL (ref 0.0–0.7)
Eosinophils Relative: 3.2 % (ref 0.0–5.0)
HCT: 35.6 % — ABNORMAL LOW (ref 39.0–52.0)
Hemoglobin: 12 g/dL — ABNORMAL LOW (ref 13.0–17.0)
Lymphocytes Relative: 13.9 % (ref 12.0–46.0)
Lymphs Abs: 0.7 10*3/uL (ref 0.7–4.0)
MCHC: 33.6 g/dL (ref 30.0–36.0)
MCV: 95.3 fl (ref 78.0–100.0)
Monocytes Absolute: 0.7 10*3/uL (ref 0.1–1.0)
Monocytes Relative: 12.8 % — ABNORMAL HIGH (ref 3.0–12.0)
Neutro Abs: 3.7 10*3/uL (ref 1.4–7.7)
Neutrophils Relative %: 69.6 % (ref 43.0–77.0)
Platelets: 224 10*3/uL (ref 150.0–400.0)
RBC: 3.74 Mil/uL — ABNORMAL LOW (ref 4.22–5.81)
RDW: 13.5 % (ref 11.5–15.5)
WBC: 5.4 10*3/uL (ref 4.0–10.5)

## 2020-05-13 MED ORDER — ARNUITY ELLIPTA 100 MCG/ACT IN AEPB: 1.0000 | INHALATION_SPRAY | Freq: Every day | RESPIRATORY_TRACT | 5 refills | Status: DC

## 2020-05-13 NOTE — Progress Notes (Signed)
[facility]  Chief Complaint  Patient presents with  . Consult    Referred by PCP for SOB for the past 6 months. Notices the SOB with exertion. Has been using an albuterol inhaler for relief. Also stated that he has gained weight in the past 6 months.     Constitutional:  BP 130/84   Pulse (!) 111   Temp 98 F (36.7 C) (Temporal)   Ht $R'6\' 1"'XP$  (1.854 m)   Wt (!) 421 lb (191 kg)   SpO2 96% Comment: on RA  BMI 55.54 kg/m   Past Medical History:  DM type 1, DKA, HTN, GERD, HLD, MRSA cellulitis, Pneumonia, Charcot foot  Past Surgical History:  He  has a past surgical history that includes Knee arthroscopy (Left, ~ 2010); Open reduction internal fixation (orif) proximal phalanx (Left, 06/30/2012); Laparoscopic cholecystectomy (2015); Hernia repair; Umbilical hernia repair (2015); Metacarpophalangeal joint arthrodesis (Left, 06/2012); Amputation (Left, 01/01/2016); I & D extremity (Left, 04/26/2016); Incise and drain abcess (Left, 04/26/2016); Application if wound vac (04/26/2016); and Carpal tunnel with cubital tunnel (Right, 10/22/2019).  Brief Summary:  Michael Hutchinson is a 45 y.o. male with dyspnea.  He had COVID 19 infection in September 2021.      Subjective:   He didn't have any significant respiratory symptoms prior to developing COVID infection in September 2021.  He didn't have any specific therapy at the time.  Since then he gets episodes of dyspnea with activity.  He also has cough with intermittent clear sputum, and wheezing.  He was prescribed albuterol inhaler.  This helps when he uses it, but doesn't last.  He gets occasional allergies symptoms, but these have been more troublesome as he has gotten older.  He doesn't smoke cigarettes.  Doesn't feel like he has any trouble breathing while asleep, but does snore some.  Chest xray from 05/10/20 was normal.  Recent echocardiogram was unremarkable.  Physical Exam:   Appearance - well  kempt   ENMT - no sinus tenderness, no oral exudate, no LAN, Mallampati 4 airway, no stridor, scalloped tongue  Respiratory - equal breath sounds bilaterally, no wheezing or rales  CV - s1s2 regular rate and rhythm, no murmurs  Ext - no clubbing, no edema, Lt lower leg prosthesis in place  Skin - no rashes  Psych - normal mood and affect   Pulmonary testing:    Chest Imaging:    Cardiac Tests:   Echo 05/01/20 >> EF 60 to 65%, grade 1 DD  Social History:  He  reports that he has never smoked. He quit smokeless tobacco use about 9 years ago.  His smokeless tobacco use included snuff and chew. He reports current alcohol use of about 12.0 standard drinks of alcohol per week. He reports that he does not use drugs.  Family History:  His family history includes Alcohol abuse in his paternal grandfather and sister; Cancer in his mother; Diabetes in his maternal grandfather and another family member; Heart attack in his father; Hyperlipidemia in his father; Hypertension in his father; Other in his mother; Stroke in his paternal grandmother.    Discussion:  He has persistent dyspnea with exertion with intermittent cough and wheezing.  These all developed after he had COVID 19 infection in September 2021.  I am concerned he could now have asthma.  Assessment/Plan:   Persistent asthma. - will have him try arnuity - continue prn albuterol - will arrange for CBC with diff, RAST with  IgE, and PFT  Snoring with morbid obesity. - he doesn't think his sleep is an issue at present - asked him to monitor for symptoms/signs of sleep apnea  Time Spent Involved in Patient Care on Day of Examination:  48 minutes  Follow up:  Patient Instructions  Arnuity one puff daily, and rinse your mouth after each use  Albuterol two puffs every 6 hours as needed for cough, wheeze, chest congestion, or shortness of breath  Will arrange for pulmonary function test  Lab tests today  Follow up in 6  weeks with Dr. Halford Chessman or nurse practitioner   Medication List:   Allergies as of 05/13/2020      Reactions   Influenza Virus Vaccine Split Nausea And Vomiting   Influenza Virus Vaccine Split Nausea And Vomiting   Hydrochlorothiazide Other (See Comments)   Dizziness   Lisinopril Cough      Medication List       Accurate as of May 13, 2020 10:20 AM. If you have any questions, ask your nurse or doctor.        STOP taking these medications   omeprazole 20 MG capsule Commonly known as: PRILOSEC Stopped by: Chesley Mires, MD   oxyCODONE 5 MG immediate release tablet Commonly known as: Roxicodone Stopped by: Chesley Mires, MD     TAKE these medications   albuterol 108 (90 Base) MCG/ACT inhaler Commonly known as: VENTOLIN HFA Inhale 1-2 puffs into the lungs every 4 (four) hours as needed for wheezing or shortness of breath.   amLODipine 5 MG tablet Commonly known as: NORVASC Take 1 tablet (5 mg total) by mouth daily.   Arnuity Ellipta 100 MCG/ACT Aepb Generic drug: Fluticasone Furoate Inhale 1 puff into the lungs daily. Started by: Chesley Mires, MD   Bayer Microlet Lancets lancets Use as instructed to check 3 times daily.   gabapentin 100 MG capsule Commonly known as: NEURONTIN Take 100-300 mg by mouth at bedtime as needed (pain.).   Glatiramer Acetate 40 MG/ML Sosy INJECT 40 MG (ONE SYRINGE) UNDER THE SKIN (SUBCUTANEOUS INJECTION) ON MONDAY WEDNESDAY AND FRIDAY   glucose blood test strip Commonly known as: Bayer Contour Next Test 1 each by Other route 4 (four) times daily. And lancets 4/day   Insulin Syringes (Disposable) U-100 1 ML Misc 3x daily   losartan 50 MG tablet Commonly known as: COZAAR TAKE 1 TABLET BY MOUTH DAILY.   methocarbamol 500 MG tablet Commonly known as: Robaxin Take 1 tablet (500 mg total) by mouth 4 (four) times daily.   metoprolol succinate 100 MG 24 hr tablet Commonly known as: TOPROL-XL TAKE 1 TABLET BY MOUTH DAILY. TAKE WITH OR  IMMEDIATELY FOLLOWING A MEAL.   Omnipod DASH Pods (Gen 4) Misc REPLACE POD EVERY 72 HOURS   ONE TOUCH ULTRA SYSTEM KIT w/Device Kit 1 kit by Does not apply route once. Check blood sugar 3 times daily. Diagnosis Diabetes ICD-10 E11.8   Santyl ointment Generic drug: collagenase Apply 1 application topically every other day. Affected area on leg   Vitamin D (Ergocalciferol) 1.25 MG (50000 UNIT) Caps capsule Commonly known as: DRISDOL Take 1 capsule (50,000 Units total) by mouth every 7 (seven) days.       Signature:  Chesley Mires, MD [place] Pager - [phone] 05/13/2020, 10:20 AM

## 2020-05-13 NOTE — Patient Instructions (Signed)
Arnuity one puff daily, and rinse your mouth after each use  Albuterol two puffs every 6 hours as needed for cough, wheeze, chest congestion, or shortness of breath  Will arrange for pulmonary function test  Lab tests today  Follow up in 6 weeks with Dr. Craige Cotta or nurse practitioner

## 2020-05-15 ENCOUNTER — Telehealth: Payer: Self-pay | Admitting: Pulmonary Disease

## 2020-05-15 NOTE — Telephone Encounter (Signed)
Pt's pharmacist is unable to fill Arnuity rx. Pt needs a PA on Arnuity.   Pharmacy name: [facility] requested: Arnuity CMM?: yes Key: [id number] Covered alternatives: none given Tried and failed: on file Decision: sent to plan, determination expected within 3 business days. Will hold in triage for follow up.

## 2020-05-19 ENCOUNTER — Encounter: Payer: Self-pay | Admitting: *Deleted

## 2020-05-19 LAB — ALLERGEN PROFILE, PERENNIAL ALLERGEN IGE

## 2020-05-19 NOTE — Telephone Encounter (Signed)
Form received from [employer]  [employer] does not need PA on this members plan.  The pharmacy stated that pt picked up RX on 05/13/20.  Nothing further is needed.

## 2020-05-20 DIAGNOSIS — E1040 Type 1 diabetes mellitus with diabetic neuropathy, unspecified: Secondary | ICD-10-CM | POA: Diagnosis not present

## 2020-05-20 DIAGNOSIS — E1059 Type 1 diabetes mellitus with other circulatory complications: Secondary | ICD-10-CM | POA: Diagnosis not present

## 2020-05-20 DIAGNOSIS — R5382 Chronic fatigue, unspecified: Secondary | ICD-10-CM | POA: Diagnosis not present

## 2020-05-20 DIAGNOSIS — I152 Hypertension secondary to endocrine disorders: Secondary | ICD-10-CM | POA: Diagnosis not present

## 2020-05-20 DIAGNOSIS — E042 Nontoxic multinodular goiter: Secondary | ICD-10-CM | POA: Insufficient documentation

## 2020-05-20 DIAGNOSIS — E1065 Type 1 diabetes mellitus with hyperglycemia: Secondary | ICD-10-CM | POA: Diagnosis not present

## 2020-06-02 ENCOUNTER — Other Ambulatory Visit: Payer: Self-pay | Admitting: Physician Assistant

## 2020-06-02 DIAGNOSIS — I152 Hypertension secondary to endocrine disorders: Secondary | ICD-10-CM

## 2020-06-02 DIAGNOSIS — E1159 Type 2 diabetes mellitus with other circulatory complications: Secondary | ICD-10-CM

## 2020-06-03 DIAGNOSIS — M25562 Pain in left knee: Secondary | ICD-10-CM | POA: Diagnosis not present

## 2020-06-05 DIAGNOSIS — Z794 Long term (current) use of insulin: Secondary | ICD-10-CM | POA: Diagnosis not present

## 2020-06-05 DIAGNOSIS — E109 Type 1 diabetes mellitus without complications: Secondary | ICD-10-CM | POA: Diagnosis not present

## 2020-06-11 ENCOUNTER — Encounter: Payer: Self-pay | Admitting: Physician Assistant

## 2020-06-16 ENCOUNTER — Other Ambulatory Visit: Payer: Self-pay | Admitting: Physician Assistant

## 2020-06-16 DIAGNOSIS — R899 Unspecified abnormal finding in specimens from other organs, systems and tissues: Secondary | ICD-10-CM

## 2020-06-16 DIAGNOSIS — Z0000 Encounter for general adult medical examination without abnormal findings: Secondary | ICD-10-CM

## 2020-06-17 ENCOUNTER — Other Ambulatory Visit: Payer: BC Managed Care – PPO

## 2020-07-20 ENCOUNTER — Other Ambulatory Visit (HOSPITAL_COMMUNITY): Payer: BC Managed Care – PPO

## 2020-07-20 ENCOUNTER — Other Ambulatory Visit (HOSPITAL_COMMUNITY): Payer: Self-pay | Admitting: Surgery

## 2020-07-20 ENCOUNTER — Other Ambulatory Visit: Payer: Self-pay | Admitting: Surgery

## 2020-07-22 ENCOUNTER — Ambulatory Visit: Payer: BC Managed Care – PPO | Admitting: Pulmonary Disease

## 2020-07-22 ENCOUNTER — Other Ambulatory Visit: Payer: Self-pay | Admitting: Physician Assistant

## 2020-07-22 DIAGNOSIS — I152 Hypertension secondary to endocrine disorders: Secondary | ICD-10-CM | POA: Diagnosis not present

## 2020-07-22 DIAGNOSIS — E1029 Type 1 diabetes mellitus with other diabetic kidney complication: Secondary | ICD-10-CM | POA: Diagnosis not present

## 2020-07-22 DIAGNOSIS — E1065 Type 1 diabetes mellitus with hyperglycemia: Secondary | ICD-10-CM | POA: Diagnosis not present

## 2020-07-22 DIAGNOSIS — E1069 Type 1 diabetes mellitus with other specified complication: Secondary | ICD-10-CM | POA: Diagnosis not present

## 2020-07-22 DIAGNOSIS — E1040 Type 1 diabetes mellitus with diabetic neuropathy, unspecified: Secondary | ICD-10-CM | POA: Diagnosis not present

## 2020-07-22 DIAGNOSIS — E785 Hyperlipidemia, unspecified: Secondary | ICD-10-CM | POA: Diagnosis not present

## 2020-07-22 DIAGNOSIS — E1059 Type 1 diabetes mellitus with other circulatory complications: Secondary | ICD-10-CM | POA: Diagnosis not present

## 2020-07-22 DIAGNOSIS — E042 Nontoxic multinodular goiter: Secondary | ICD-10-CM | POA: Diagnosis not present

## 2020-07-29 ENCOUNTER — Ambulatory Visit (INDEPENDENT_AMBULATORY_CARE_PROVIDER_SITE_OTHER): Payer: BC Managed Care – PPO | Admitting: Physician Assistant

## 2020-07-29 ENCOUNTER — Encounter: Payer: Self-pay | Admitting: Physician Assistant

## 2020-07-29 ENCOUNTER — Other Ambulatory Visit: Payer: Self-pay

## 2020-07-29 VITALS — BP 139/82 | HR 94 | Temp 98.7°F | Ht 72.0 in | Wt 395.0 lb

## 2020-07-29 DIAGNOSIS — E1159 Type 2 diabetes mellitus with other circulatory complications: Secondary | ICD-10-CM

## 2020-07-29 DIAGNOSIS — E782 Mixed hyperlipidemia: Secondary | ICD-10-CM

## 2020-07-29 DIAGNOSIS — E559 Vitamin D deficiency, unspecified: Secondary | ICD-10-CM

## 2020-07-29 DIAGNOSIS — E1069 Type 1 diabetes mellitus with other specified complication: Secondary | ICD-10-CM | POA: Diagnosis not present

## 2020-07-29 DIAGNOSIS — E1065 Type 1 diabetes mellitus with hyperglycemia: Secondary | ICD-10-CM

## 2020-07-29 DIAGNOSIS — I152 Hypertension secondary to endocrine disorders: Secondary | ICD-10-CM | POA: Diagnosis not present

## 2020-07-29 NOTE — Assessment & Plan Note (Addendum)
-  Most recent A1c improved from 7.5 to 7.1. -Continue to follow-up with endocrinology and current medication regimen. -Recommend to continue monitoring for hypoglycemia and advised if having recurrent events to notify endocrinologist for modifications with insulin pump. -Continue weight loss efforts. -Advised to contact insurance and inquire in-network ophthalmology locations and notify the clinic if referral is needed. -Will continue to monitor.

## 2020-07-29 NOTE — Assessment & Plan Note (Signed)
-  Stable. -Continue current medication regimen. -Will collect CMP for medication monitoring. -Will continue to monitor. 

## 2020-07-29 NOTE — Assessment & Plan Note (Addendum)
-  Last lipid panel: Total cholesterol 203, triglycerides 167, HDL 44, LDL 129 -Patient has a history of statin intolerance, recently started on pravastatin and recommend to continue medication, monitor for potential side effects.  Recommend to stay off well-hydrated. -Continue weight loss efforts. -Will repeat lipid panel and hepatic function today.

## 2020-07-29 NOTE — Progress Notes (Signed)
Established Patient Office Visit  Subjective:  Patient ID: Michael Hutchinson, male    DOB: [birth year]  Age: 45 y.o. MRN: [id number]  CC:  Chief Complaint  Patient presents with   Follow-up   Diabetes   Hypertension   Hyperlipidemia    HPI Michael Hutchinson presents for follow up on diabetes mellitus, hypertension and hyperlipidemia. Has no acute concerns today.  Diabetes mellitus type 1: Pt denies increased urination or thirst. Pt reports medication compliance. Reports a few hypoglycemic events at night with sugar readings in 42. Patient monitors carbohydrate/glucose intake.   HTN: Pt denies chest pain, palpitations, dizziness or increased lower extremity swelling. Taking medication as directed without side effects. Does not check BP at home. Pt tries to follow low sodium diet.  HLD: Pt reports statin intolerance due to severe joint pain, states couldn't even walk. Patient was recently started on pravastatin by endocrinologist and reports has not experienced significant side effects. Patient does try to limit fried foods.   Weight: Patient is undergoing the process for bariatric surgery. Will be seeing a nutritionist soon. States has tried to be a little more active. Is eating smaller portions.  Past Medical History:  Diagnosis Date   Charcot's joint of foot 18/29/9371   Complication of anesthesia    "I wake up angry" (12/31/2015)   Diabetes mellitus type 1 ([place]) dx'd 1981   Diabetic ketoacidosis ([place])    Essential hypertension 05/03/2013   GERD (gastroesophageal reflux disease)    High cholesterol    Hx MRSA infection    Inner thigh and under arm- healed areas   Meniscus tear    Pneumonia    2016 ish   Shortness of breath    with exertion only    Past Surgical History:  Procedure Laterality Date   AMPUTATION Left 01/01/2016   Procedure: AMPUTATION BELOW KNEE;  Surgeon: [name], MD;  Location: [facility];  Service: Orthopedics;  Laterality: Left;   APPLICATION OF WOUND VAC   04/26/2016   CARPAL TUNNEL WITH CUBITAL TUNNEL Right 10/22/2019   Procedure: Right carpal tunnel release, right cubital tunnel release in situ, Right pronator/proximal median nerve release, Right elbow ECRB debridement with partial ostectomy and repair as necessary;  Surgeon: [name], MD;  Location: [facility];  Service: Orthopedics;  Laterality: Right;  166mn   HERNIA REPAIR     I & D EXTREMITY Left 04/26/2016   Procedure: IRRIGATION AND DEBRIDEMENT EXTREMITY/Left Leg/Possible Wound Vac;  Surgeon: JWylene Simmer MD;  [facility]  Service: Orthopedics;  Laterality: Left;   INCISE AND DRAIN ABCESS Left 04/26/2016   KNEE ARTHROSCOPY Left ~ 2010   LAPAROSCOPIC CHOLECYSTECTOMY  2015   METACARPOPHALANGEAL JOINT ARTHRODESIS Left 06/2012   Fracture left index finger intra-articular MCP joint/notes 06/30/2012   OPEN REDUCTION INTERNAL FIXATION (ORIF) PROXIMAL PHALANX Left 06/30/2012   Procedure: OPEN REDUCTION INTERNAL FIXATION (ORIF) LEFT INDEX FINGER PROXIMAL PHALANX FRACTURE WITH LIGAMENT REPAIR AS NECESSARY;  Surgeon: WRoseanne Kaufman MD;  Location[facility]  Service: Orthopedics;  Laterality: Left;   UMBILICAL HERNIA REPAIR  2015   "w/gallbladder OR"    Family History  Problem Relation Age of Onset   Other Mother    Cancer Mother        Breast / Bone   Heart attack Father    Hypertension Father    Hyperlipidemia Father    Diabetes Other    Alcohol abuse Sister    Diabetes Maternal Grandfather    Stroke Paternal Grandmother  Alcohol abuse Paternal Grandfather     Social History   Socioeconomic History   Marital status: Married    Spouse name: Estill Bamberg   Number of children: 2   Years of education: Not on file   Highest education level: High school graduate  Occupational History   Occupation: Dealer  Tobacco Use   Smoking status: Never   Smokeless tobacco: Former    Types: Snuff, Chew    Quit date: 08/25/2010  Vaping Use   Vaping Use: Never used  Substance and Sexual  Activity   Alcohol use: Yes    Alcohol/week: 12.0 standard drinks    Types: 12 Cans of beer per week   Drug use: No   Sexual activity: Yes  Other Topics Concern   Not on file  Social History Narrative   Pt is a right handed married man, lives with his wife and 2 children ages 17 and 3, in a single story house. Pt drinks 6-8, 16oz diet sodas a day.    Social Determinants of Health   Financial Resource Strain: Not on file  Food Insecurity: Not on file  Transportation Needs: Not on file  Physical Activity: Not on file  Stress: Not on file  Social Connections: Not on file  Intimate Partner Violence: Not on file    Outpatient Medications Prior to Visit  Medication Sig Dispense Refill   albuterol (VENTOLIN HFA) 108 (90 Base) MCG/ACT inhaler Inhale 1-2 puffs into the lungs every 4 (four) hours as needed for wheezing or shortness of breath. 8 g 2   amLODipine (NORVASC) 5 MG tablet TAKE 1 TABLET BY MOUTH DAILY. 90 tablet 0   BAYER MICROLET LANCETS lancets Use as instructed to check 3 times daily. 100 each 12   Blood Glucose Monitoring Suppl (ONE TOUCH ULTRA SYSTEM KIT) w/Device KIT 1 kit by Does not apply route once. Check blood sugar 3 times daily. Diagnosis Diabetes ICD-10 E11.8     Fluticasone Furoate (ARNUITY ELLIPTA) 100 MCG/ACT AEPB Inhale 1 puff into the lungs daily. 30 each 5   gabapentin (NEURONTIN) 100 MG capsule Take 100-300 mg by mouth at bedtime as needed (pain.).      Glucagon (BAQSIMI ONE PACK) 3 MG/DOSE POWD Baqsimi 3 mg/actuation nasal spray  USE NASALLY AS NEEDED     glucose blood (BAYER CONTOUR NEXT TEST) test strip 1 each by Other route 4 (four) times daily. And lancets 4/day 400 each 3   Insulin Syringes, Disposable, U-100 1 ML MISC 3x daily 50 each 2   losartan (COZAAR) 50 MG tablet TAKE 1 TABLET BY MOUTH DAILY. 90 tablet 0   metFORMIN (GLUCOPHAGE-XR) 500 MG 24 hr tablet Take 1 tablet by mouth 2 (two) times daily.     methocarbamol (ROBAXIN) 500 MG tablet Take 1  tablet (500 mg total) by mouth 4 (four) times daily. 40 tablet 0   metoprolol succinate (TOPROL-XL) 100 MG 24 hr tablet TAKE 1 TABLET BY MOUTH DAILY. TAKE WITH OR IMMEDIATELY FOLLOWING A MEAL. 90 tablet 0   pravastatin (PRAVACHOL) 10 MG tablet Take 10 mg by mouth daily.     SANTYL ointment Apply 1 application topically every other day. Affected area on leg     Vitamin D, Ergocalciferol, (DRISDOL) 1.25 MG (50000 UNIT) CAPS capsule Take 1 capsule (50,000 Units total) by mouth every 7 (seven) days. 8 capsule 6   Glatiramer Acetate 40 MG/ML SOSY INJECT 40 MG (ONE SYRINGE) UNDER THE SKIN (SUBCUTANEOUS INJECTION) ON MONDAY WEDNESDAY AND FRIDAY 12 mL  0   Insulin Disposable Pump (OMNIPOD DASH 5 PACK PODS) MISC REPLACE POD EVERY 72 HOURS 30 each 2   No facility-administered medications prior to visit.    Allergies  Allergen Reactions   Influenza Virus Vaccine Split Nausea And Vomiting   Influenza Virus Vaccine Split Nausea And Vomiting   Hydrochlorothiazide Other (See Comments)    Dizziness   Lisinopril Cough    ROS Review of Systems Review of Systems:  A fourteen system review of systems was performed and found to be positive as per HPI.   Objective:    Physical Exam General:  Pleasant and cooperative, in no acute distress  Neuro:  Alert and oriented,  extra-ocular muscles intact  HEENT:  Normocephalic, atraumatic, neck supple Skin:  no gross rash, warm, pink. Cardiac:  RRR Respiratory:  ECTA B/L, Not using accessory muscles, speaking in full sentences- unlabored. Vascular:  Ext warm, no cyanosis apprec.; no gross edema Psych:  No HI/SI, judgement and insight good, Euthymic mood. Full Affect.  BP 139/82   Pulse 94   Temp 98.7 F (37.1 C)   Ht 6' (1.829 m)   Wt (!) 395 lb (179.2 kg)   SpO2 97%   BMI 53.57 kg/m  Wt Readings from Last 3 Encounters:  07/29/20 (!) 395 lb (179.2 kg)  05/13/20 (!) 421 lb (191 kg)  04/29/20 (!) 433 lb (196.4 kg)     Health Maintenance Due  Topic  Date Due   COVID-19 Vaccine (1) Never done   OPHTHALMOLOGY EXAM  Never done   Pneumococcal Vaccine 85-15 Years old (2 - PCV) 01/11/2011   TETANUS/TDAP  01/12/2015    There are no preventive care reminders to display for this patient.  Lab Results  Component Value Date   TSH 2.310 05/01/2020   Lab Results  Component Value Date   WBC 5.4 05/13/2020   HGB 12.0 (L) 05/13/2020   HCT 35.6 (L) 05/13/2020   MCV 95.3 05/13/2020   PLT 224.0 05/13/2020   Lab Results  Component Value Date   NA 134 05/01/2020   K 4.9 05/01/2020   CO2 22 05/01/2020   GLUCOSE 179 (H) 05/01/2020   BUN 11 05/01/2020   CREATININE 0.96 05/01/2020   BILITOT 0.4 05/01/2020   ALKPHOS 111 05/01/2020   AST 46 (H) 05/01/2020   ALT 72 (H) 05/01/2020   PROT 6.9 05/01/2020   ALBUMIN 4.0 05/01/2020   CALCIUM 9.1 05/01/2020   ANIONGAP 13 10/22/2019   EGFR 101 05/01/2020   GFR 73.74 07/02/2018   Lab Results  Component Value Date   CHOL 203 (H) 05/01/2020   Lab Results  Component Value Date   HDL 44 05/01/2020   Lab Results  Component Value Date   LDLCALC 129 (H) 05/01/2020   Lab Results  Component Value Date   TRIG 167 (H) 05/01/2020   Lab Results  Component Value Date   CHOLHDL 4.6 05/01/2020   Lab Results  Component Value Date   HGBA1C 7.5 (H) 07/03/2019      Assessment & Plan:   Problem List Items Addressed This Visit       Cardiovascular and Mediastinum   Hypertension associated with diabetes ([place]) (Chronic)    -Stable. -Continue current medication regimen. Will collect CMP for medication monitoring. -Will continue to monitor.       Relevant Medications   Glucagon (BAQSIMI ONE PACK) 3 MG/DOSE POWD   metFORMIN (GLUCOPHAGE-XR) 500 MG 24 hr tablet   pravastatin (PRAVACHOL) 10 MG tablet  Other Relevant Orders   Comp Met (CMET)     Endocrine   Mixed diabetic hyperlipidemia associated with type 1 diabetes mellitus ([facility]) (Chronic)    -Last lipid panel: Total cholesterol 203,  triglycerides 167, HDL 44, LDL 129 -Patient has a history of statin intolerance, recently started on pravastatin and recommend to continue medication, monitor for potential side effects.  Recommend to stay off well-hydrated. -Continue weight loss efforts. -Will repeat lipid panel and hepatic function today.        Relevant Medications   Glucagon (BAQSIMI ONE PACK) 3 MG/DOSE POWD   metFORMIN (GLUCOPHAGE-XR) 500 MG 24 hr tablet   pravastatin (PRAVACHOL) 10 MG tablet   Other Relevant Orders   Comp Met (CMET)   Lipid Profile   Type I diabetes mellitus ([place]) - Primary    -Most recent A1c improved from 7.5 to 7.1. -Continue to follow-up with endocrinology and current medication regimen. -Recommend to continue monitoring for hypoglycemia and advised if having recurrent events to notify endocrinologist for modifications with insulin pump. -Continue weight loss efforts. -Will continue to monitor.       Relevant Medications   Glucagon (BAQSIMI ONE PACK) 3 MG/DOSE POWD   metFORMIN (GLUCOPHAGE-XR) 500 MG 24 hr tablet   pravastatin (PRAVACHOL) 10 MG tablet   Other Relevant Orders   Comp Met (CMET)     Other   Vitamin D deficiency (Chronic)   Relevant Orders   Vitamin D (25 hydroxy)   Other Visit Diagnoses     Morbid obesity ([place])       Relevant Medications   Glucagon (BAQSIMI ONE PACK) 3 MG/DOSE POWD   metFORMIN (GLUCOPHAGE-XR) 500 MG 24 hr tablet      Morbid obesity: -Associated with hypertension, hyperlipidemia, diabetes mellitus. -Praised patient for weight loss, has approx. 33 pounds since last visit in 04/29/20. -Encourage to continue with weight loss efforts, reduced calorie intake and dietary changes.  Vitamin D deficiency: -Last Vitamin D 27.6, patient on once a week Vitamin D supplement. Will repeat Vitamin D today and adjust treatment plan if indicated.  No orders of the defined types were placed in this encounter.   Follow-up: Return in about 4 months (around  11/29/2020) for DM, HTN, HLD.   Note:  This note was prepared with assistance of Dragon voice recognition software. Occasional wrong-word or sound-a-like substitutions may have occurred due to the inherent limitations of voice recognition software.  Lorrene Reid, PA-C

## 2020-07-29 NOTE — Patient Instructions (Signed)
https://www.nhlbi.nih.gov/files/docs/public/heart/dash_brief.pdf">  DASH Eating Plan DASH stands for Dietary Approaches to Stop Hypertension. The DASH eating plan is a healthy eating plan that has been shown to: Reduce high blood pressure (hypertension). Reduce your risk for type 2 diabetes, heart disease, and stroke. Help with weight loss. What are tips for following this plan? Reading food labels Check food labels for the amount of salt (sodium) per serving. Choose foods with less than 5 percent of the Daily Value of sodium. Generally, foods with less than 300 milligrams (mg) of sodium per serving fit into this eating plan. To find whole grains, look for the word "whole" as the first word in the ingredient list. Shopping Buy products labeled as "low-sodium" or "no salt added." Buy fresh foods. Avoid canned foods and pre-made or frozen meals. Cooking Avoid adding salt when cooking. Use salt-free seasonings or herbs instead of table salt or sea salt. Check with your health care provider or pharmacist before using salt substitutes. Do not fry foods. Cook foods using healthy methods such as baking, boiling, grilling, roasting, and broiling instead. Cook with heart-healthy oils, such as olive, canola, avocado, soybean, or sunflower oil. Meal planning  Eat a balanced diet that includes: 4 or more servings of fruits and 4 or more servings of vegetables each day. Try to fill one-half of your plate with fruits and vegetables. 6-8 servings of whole grains each day. Less than 6 oz (170 g) of lean meat, poultry, or fish each day. A 3-oz (85-g) serving of meat is about the same size as a deck of cards. One egg equals 1 oz (28 g). 2-3 servings of low-fat dairy each day. One serving is 1 cup (237 mL). 1 serving of nuts, seeds, or beans 5 times each week. 2-3 servings of heart-healthy fats. Healthy fats called omega-3 fatty acids are found in foods such as walnuts, flaxseeds, fortified milks, and eggs.  These fats are also found in cold-water fish, such as sardines, salmon, and mackerel. Limit how much you eat of: Canned or prepackaged foods. Food that is high in trans fat, such as some fried foods. Food that is high in saturated fat, such as fatty meat. Desserts and other sweets, sugary drinks, and other foods with added sugar. Full-fat dairy products. Do not salt foods before eating. Do not eat more than 4 egg yolks a week. Try to eat at least 2 vegetarian meals a week. Eat more home-cooked food and less restaurant, buffet, and fast food.  Lifestyle When eating at a restaurant, ask that your food be prepared with less salt or no salt, if possible. If you drink alcohol: Limit how much you use to: 0-1 drink a day for women who are not pregnant. 0-2 drinks a day for men. Be aware of how much alcohol is in your drink. In the [place] one drink equals one 12 oz bottle of beer (355 mL), one 5 oz glass of wine (148 mL), or one 1 oz glass of hard liquor (44 mL). General information Avoid eating more than 2,300 mg of salt a day. If you have hypertension, you may need to reduce your sodium intake to 1,500 mg a day. Work with your health care provider to maintain a healthy body weight or to lose weight. Ask what an ideal weight is for you. Get at least 30 minutes of exercise that causes your heart to beat faster (aerobic exercise) most days of the week. Activities may include walking, swimming, or biking. Work with your health care provider   or dietitian to adjust your eating plan to your individual calorie needs. What foods should I eat? Fruits All fresh, dried, or frozen fruit. Canned fruit in natural juice (without addedsugar). Vegetables Fresh or frozen vegetables (raw, steamed, roasted, or grilled). Low-sodium or reduced-sodium tomato and vegetable juice. Low-sodium or reduced-sodium tomatosauce and tomato paste. Low-sodium or reduced-sodium canned vegetables. Grains Whole-grain or  whole-wheat bread. Whole-grain or whole-wheat pasta. Brown rice. Oatmeal. Quinoa. Bulgur. Whole-grain and low-sodium cereals. Pita bread.Low-fat, low-sodium crackers. Whole-wheat flour tortillas. Meats and other proteins Skinless chicken or turkey. Ground chicken or turkey. Pork with fat trimmed off. Fish and seafood. Egg whites. Dried beans, peas, or lentils. Unsalted nuts, nut butters, and seeds. Unsalted canned beans. Lean cuts of beef with fat trimmed off. Low-sodium, lean precooked or cured meat, such as sausages or meatloaves. Dairy Low-fat (1%) or fat-free (skim) milk. Reduced-fat, low-fat, or fat-free cheeses. Nonfat, low-sodium ricotta or cottage cheese. Low-fat or nonfatyogurt. Low-fat, low-sodium cheese. Fats and oils Soft margarine without trans fats. Vegetable oil. Reduced-fat, low-fat, or light mayonnaise and salad dressings (reduced-sodium). Canola, safflower, olive, avocado, soybean, andsunflower oils. Avocado. Seasonings and condiments Herbs. Spices. Seasoning mixes without salt. Other foods Unsalted popcorn and pretzels. Fat-free sweets. The items listed above may not be a complete list of foods and beverages you can eat. Contact a dietitian for more information. What foods should I avoid? Fruits Canned fruit in a light or heavy syrup. Fried fruit. Fruit in cream or buttersauce. Vegetables Creamed or fried vegetables. Vegetables in a cheese sauce. Regular canned vegetables (not low-sodium or reduced-sodium). Regular canned tomato sauce and paste (not low-sodium or reduced-sodium). Regular tomato and vegetable juice(not low-sodium or reduced-sodium). Pickles. Olives. Grains Baked goods made with fat, such as croissants, muffins, or some breads. Drypasta or rice meal packs. Meats and other proteins Fatty cuts of meat. Ribs. Fried meat. Bacon. Bologna, salami, and other precooked or cured meats, such as sausages or meat loaves. Fat from the back of a pig (fatback). Bratwurst.  Salted nuts and seeds. Canned beans with added salt. Canned orsmoked fish. Whole eggs or egg yolks. Chicken or turkey with skin. Dairy Whole or 2% milk, cream, and half-and-half. Whole or full-fat cream cheese. Whole-fat or sweetened yogurt. Full-fat cheese. Nondairy creamers. Whippedtoppings. Processed cheese and cheese spreads. Fats and oils Butter. Stick margarine. Lard. Shortening. Ghee. Bacon fat. Tropical oils, suchas coconut, palm kernel, or palm oil. Seasonings and condiments Onion salt, garlic salt, seasoned salt, table salt, and sea salt. Worcestershire sauce. Tartar sauce. Barbecue sauce. Teriyaki sauce. Soy sauce, including reduced-sodium. Steak sauce. Canned and packaged gravies. Fish sauce. Oyster sauce. Cocktail sauce. Store-bought horseradish. Ketchup. Mustard. Meat flavorings and tenderizers. Bouillon cubes. Hot sauces. Pre-made or packaged marinades. Pre-made or packaged taco seasonings. Relishes. Regular saladdressings. Other foods Salted popcorn and pretzels. The items listed above may not be a complete list of foods and beverages you should avoid. Contact a dietitian for more information. Where to find more information National Heart, Lung, and Blood Institute: www.nhlbi.nih.gov American Heart Association: www.heart.org Academy of Nutrition and Dietetics: www.eatright.org National Kidney Foundation: www.kidney.org Summary The DASH eating plan is a healthy eating plan that has been shown to reduce high blood pressure (hypertension). It may also reduce your risk for type 2 diabetes, heart disease, and stroke. When on the DASH eating plan, aim to eat more fresh fruits and vegetables, whole grains, lean proteins, low-fat dairy, and heart-healthy fats. With the DASH eating plan, you should limit salt (sodium) intake to 2,300   mg a day. If you have hypertension, you may need to reduce your sodium intake to 1,500 mg a day. Work with your health care provider or dietitian to adjust  your eating plan to your individual calorie needs. This information is not intended to replace advice given to you by your health care provider. Make sure you discuss any questions you have with your healthcare provider. Document Revised: 11/30/2018 Document Reviewed: 11/30/2018 Elsevier Patient Education  2022 Elsevier Inc.  

## 2020-07-30 ENCOUNTER — Telehealth: Payer: Self-pay | Admitting: Physician Assistant

## 2020-07-30 LAB — COMPREHENSIVE METABOLIC PANEL
ALT: 15 IU/L (ref 0–44)
AST: 13 IU/L (ref 0–40)
Albumin/Globulin Ratio: 1.7 (ref 1.2–2.2)
Albumin: 4.3 g/dL (ref 4.0–5.0)
Alkaline Phosphatase: 97 IU/L (ref 44–121)
BUN/Creatinine Ratio: 16 (ref 9–20)
BUN: 18 mg/dL (ref 6–24)
Bilirubin Total: 0.4 mg/dL (ref 0.0–1.2)
CO2: 26 mmol/L (ref 20–29)
Calcium: 9.4 mg/dL (ref 8.7–10.2)
Chloride: 98 mmol/L (ref 96–106)
Creatinine, Ser: 1.16 mg/dL (ref 0.76–1.27)
Globulin, Total: 2.6 g/dL (ref 1.5–4.5)
Glucose: 216 mg/dL — ABNORMAL HIGH (ref 65–99)
Potassium: 5.4 mmol/L — ABNORMAL HIGH (ref 3.5–5.2)
Sodium: 137 mmol/L (ref 134–144)
Total Protein: 6.9 g/dL (ref 6.0–8.5)
eGFR: 80 mL/min/{1.73_m2} (ref >59)

## 2020-07-30 LAB — LIPID PANEL
Chol/HDL Ratio: 4.9 ratio (ref 0.0–5.0)
Cholesterol, Total: 175 mg/dL (ref 100–199)
HDL: 36 mg/dL — ABNORMAL LOW (ref >39)
LDL Chol Calc (NIH): 118 mg/dL — ABNORMAL HIGH (ref 0–99)
Triglycerides: 116 mg/dL (ref 0–149)
VLDL Cholesterol Cal: 21 mg/dL (ref 5–40)

## 2020-07-30 LAB — VITAMIN D 25 HYDROXY (VIT D DEFICIENCY, FRACTURES): Vit D, 25-Hydroxy: 36.1 ng/mL (ref 30.0–100.0)

## 2020-07-30 NOTE — Telephone Encounter (Signed)
Please advise patient provider has not yet reviewed labs since they were drawn yesterday and just resulted this morning and we will contact once she has had time to review them. AS, CMA

## 2020-07-30 NOTE — Telephone Encounter (Signed)
Patient would like to inquire about his his recent labs. Please advise, thanks.

## 2020-07-31 ENCOUNTER — Ambulatory Visit: Payer: BC Managed Care – PPO

## 2020-07-31 NOTE — Progress Notes (Signed)
NEUROLOGY FOLLOW UP OFFICE NOTE  KENDON SEDENO [id number]  Assessment/Plan:   Multiple sclerosis:  While ischemic cranial nerve palsies are seen in uncontrolled diabetes, given the 3 distinct episodes of various cranial nerve palsies, the appearance of white matter lesions on brain MRI, and CSF analysis demonstrated for oligoclonal bands not present in serum, I suspect MS.  He does have significantly elevated CSF protein but does not exhibit any sign of infection.  Diabetes may be contributor to this. Type 1 diabetes mellitus  DMT:  Ocrevus Increase D3 to 6000 IU daily and repeat vit D level in 6 months Check quantitative immunoglobulin panel today and in 6 months. Check MRI of brain with and without contrast Follow up 6 months  Subjective:  Harl Favor is a 45 year old right-handed Caucasian man with type 1 diabetes with history of DKA, hypertension, Charcot's joint of foot, left below the knee amputation, hyperlipidemia and GERD who follows up for multiple sclerosis   UPDATE: Current disease modifying therapy: Ocrevus (status post 1 infusion - next infusion next month) Other current medications: D3 3000 IU daily  He wanted to retry Ocrevus.  Scheduled for next month.  Hepatic panel this month was normal. Vit D level was 27.6.  Hgb A1c was 7.1.    Vision:  OK Motor:  Sometimes notes tremors in either hand, primarily the right.   Sensory:  No numbness. Pain:  Pain in low back radiating down legs.  Hurts to stand. . Gait:  No change Bowel/Bladder:  No issues. Fatigue:  yes.    Sleep study negative for OSA.   Cognition:  no Mood:  No depression     HISTORY:  In October 2016, he developed a right 6th cranial nerve palsy which was thought to be due to his uncontrolled type 1 diabetes.   In March 2017, he developed blurred vision in the left eye.  He saw ophthalmologist Dr. Julian Reil who diagnosed optic neuritis.  MRI of the brain and orbits with and without contrast  from 05/11/2015 revealed "2 T2 hyperintense lesions within the left hemisphere adjacent to the posterior horn of the left lateral ventricle ", a nonspecific finding and without abnormal contrast enhancement.   In March 2019, he developed disconjugate gaze and droopy left eyelid.  He saw the eye doctor who diagnosed a left 3rd nerve palsy.  He was evaluated in the ED at [facility] on 03/28/2017.  CTA of the head was negative for aneurysm.  MRI of the brain with and without contrast demonstrated multiple T2/flair hyperintense foci involving the periventricular, deep and subcortical white matter, progressed compared to prior MRI from 2017.  In-house neurology believed his findings were more likely due to a diabetic 3rd nerve palsy rather than MS.  He was advised to start aspirin 81 mg daily and follow-up with outpatient neurology.  He subsequently underwent a work-up for multiple sclerosis.  MRI of the cervical spine with and without contrast from 05/04/2017 revealed no cord lesions.  He underwent lumbar puncture on 05/19/2017 for CSF analysis which revealed elevated IgG index of 0.79 and 4 oligoclonal bands not present in the corresponding serum.  Cell count was 1, elevated protein 157, elevated glucose 100, negative myelin basic protein, ACE 4, Gram stain and culture negative.   Past disease modifying therapy: Tecfidera (GI upset), Ocrevus (hives but declined premedicated Benadryl)   Imaging: 05/11/2015 MRI BRAIN W WO:  Two T2 hyperintense lesions within the left hemisphere adjacent to the posterior horn  of the left lateral ventricle. The finding is nonspecific but can be seen in the setting of chronic microvascular ischemia, a demyelinating process such as multiple sclerosis, vasculitis, complicated migraine headaches, or as the sequelae of a prior infectious or inflammatory process. 05/11/2015 MRI ORBITS W WO:  Within normal limits 03/28/2017 MRI BRAIN & ORBITS W WO:  1. Multiple T2/FLAIR hyperintense  foci involving the periventricular, deep, and subcortical white matter of both cerebral hemispheres in a distribution highly suspicious for possible demyelinating disease/multiple sclerosis. Changes have progressed relative to 2017. No evidence for active demyelination.  2. Negative MRI of the orbits. No findings to suggest acute optic neuritis or other abnormality identified. 3. Bilateral mastoid effusions, left greater than right. 05/04/2017 MRI CERVICAL SPINE W WO:  Normal 02/25/2018 MRI BRAIN W WO: No change since the study of March 2019. Multiple foci of abnormal T2 and FLAIR signal within the cerebral hemispheric deep and subcortical white matter, consistent with the clinical diagnosis of multiple sclerosis. No new or progressive lesions. No lesions show contrast enhancement or restricted diffusion.  PAST MEDICAL HISTORY: Past Medical History:  Diagnosis Date   Charcot's joint of foot 62/86/3817   Complication of anesthesia    "I wake up angry" (12/31/2015)   Diabetes mellitus type 1 ([place]) dx'd 1981   Diabetic ketoacidosis ([place])    Essential hypertension 05/03/2013   GERD (gastroesophageal reflux disease)    High cholesterol    Hx MRSA infection    Inner thigh and under arm- healed areas   Meniscus tear    Pneumonia    2016 ish   Shortness of breath    with exertion only    MEDICATIONS: Current Outpatient Medications on File Prior to Visit  Medication Sig Dispense Refill   albuterol (VENTOLIN HFA) 108 (90 Base) MCG/ACT inhaler Inhale 1-2 puffs into the lungs every 4 (four) hours as needed for wheezing or shortness of breath. 8 g 2   amLODipine (NORVASC) 5 MG tablet TAKE 1 TABLET BY MOUTH DAILY. 90 tablet 0   BAYER MICROLET LANCETS lancets Use as instructed to check 3 times daily. 100 each 12   Blood Glucose Monitoring Suppl (ONE TOUCH ULTRA SYSTEM KIT) w/Device KIT 1 kit by Does not apply route once. Check blood sugar 3 times daily. Diagnosis Diabetes ICD-10 E11.8      Fluticasone Furoate (ARNUITY ELLIPTA) 100 MCG/ACT AEPB Inhale 1 puff into the lungs daily. 30 each 5   gabapentin (NEURONTIN) 100 MG capsule Take 100-300 mg by mouth at bedtime as needed (pain.).      Glucagon (BAQSIMI ONE PACK) 3 MG/DOSE POWD Baqsimi 3 mg/actuation nasal spray  USE NASALLY AS NEEDED     glucose blood (BAYER CONTOUR NEXT TEST) test strip 1 each by Other route 4 (four) times daily. And lancets 4/day 400 each 3   Insulin Syringes, Disposable, U-100 1 ML MISC 3x daily 50 each 2   losartan (COZAAR) 50 MG tablet TAKE 1 TABLET BY MOUTH DAILY. 90 tablet 0   metFORMIN (GLUCOPHAGE-XR) 500 MG 24 hr tablet Take 1 tablet by mouth 2 (two) times daily.     methocarbamol (ROBAXIN) 500 MG tablet Take 1 tablet (500 mg total) by mouth 4 (four) times daily. 40 tablet 0   metoprolol succinate (TOPROL-XL) 100 MG 24 hr tablet TAKE 1 TABLET BY MOUTH DAILY. TAKE WITH OR IMMEDIATELY FOLLOWING A MEAL. 90 tablet 0   pravastatin (PRAVACHOL) 10 MG tablet Take 10 mg by mouth daily.     SANTYL  ointment Apply 1 application topically every other day. Affected area on leg     Vitamin D, Ergocalciferol, (DRISDOL) 1.25 MG (50000 UNIT) CAPS capsule Take 1 capsule (50,000 Units total) by mouth every 7 (seven) days. 8 capsule 6   No current facility-administered medications on file prior to visit.    ALLERGIES: Allergies  Allergen Reactions   Influenza Virus Vaccine Split Nausea And Vomiting   Influenza Virus Vaccine Split Nausea And Vomiting   Hydrochlorothiazide Other (See Comments)    Dizziness   Lisinopril Cough    FAMILY HISTORY: Family History  Problem Relation Age of Onset   Other Mother    Cancer Mother        Breast / Bone   Heart attack Father    Hypertension Father    Hyperlipidemia Father    Diabetes Other    Alcohol abuse Sister    Diabetes Maternal Grandfather    Stroke Paternal Grandmother    Alcohol abuse Paternal Grandfather       Objective:  Blood pressure (!) 142/87, pulse  89, height 6' (1.829 m), weight (!) 395 lb (179.2 kg), SpO2 99 %. General: No acute distress.  Patient appears well-groomed.   Head:  Normocephalic/atraumatic Eyes:  Fundi examined but not visualized Neck: supple, no paraspinal tenderness, full range of motion Heart:  Regular rate and rhythm Lungs:  Clear to auscultation bilaterally Back: No paraspinal tenderness Neurological Exam: alert and oriented to person, place, and time. Speech fluent and not dysarthric, language intact.  Slight reduced adduction of left eye.  Otherwise, CN II-XII intact. Bulk and tone normal, muscle strength 5/5 throughout.  Sensation temperature and vibration reduced in foot.  Deep tendon reflexes absent throughout.  Finger to nose and heel to shin testing intact.  Gait stable, Romberg negative.   Metta Clines, DO  CC: Lorrene Reid, PA-C

## 2020-08-03 ENCOUNTER — Encounter: Payer: Self-pay | Admitting: Neurology

## 2020-08-03 ENCOUNTER — Other Ambulatory Visit: Payer: Self-pay

## 2020-08-03 ENCOUNTER — Ambulatory Visit (INDEPENDENT_AMBULATORY_CARE_PROVIDER_SITE_OTHER): Payer: BC Managed Care – PPO | Admitting: Neurology

## 2020-08-03 ENCOUNTER — Other Ambulatory Visit (INDEPENDENT_AMBULATORY_CARE_PROVIDER_SITE_OTHER): Payer: BC Managed Care – PPO

## 2020-08-03 VITALS — BP 142/87 | HR 89 | Ht 72.0 in | Wt 395.0 lb

## 2020-08-03 DIAGNOSIS — E10610 Type 1 diabetes mellitus with diabetic neuropathic arthropathy: Secondary | ICD-10-CM | POA: Diagnosis not present

## 2020-08-03 DIAGNOSIS — E1065 Type 1 diabetes mellitus with hyperglycemia: Secondary | ICD-10-CM | POA: Diagnosis not present

## 2020-08-03 DIAGNOSIS — G35 Multiple sclerosis: Secondary | ICD-10-CM

## 2020-08-03 DIAGNOSIS — IMO0002 Reserved for concepts with insufficient information to code with codable children: Secondary | ICD-10-CM

## 2020-08-03 LAB — VITAMIN D 25 HYDROXY (VIT D DEFICIENCY, FRACTURES): VITD: 34.89 ng/mL (ref 30.00–100.00)

## 2020-08-03 NOTE — Patient Instructions (Signed)
Continue Ocrevus Increase D3 to 6000 IU daily Check quantitative immunoglobulin now and again in 6 months.  Check vitamin D level in 6 months Check MRI of brain with and without contrast now Follow up 6 months.

## 2020-08-04 LAB — IGG, IGA, IGM
IgG (Immunoglobin G), Serum: 1200 mg/dL (ref 600–1640)
IgM, Serum: 50 mg/dL (ref 50–300)
Immunoglobulin A: 322 mg/dL — ABNORMAL HIGH (ref 47–310)

## 2020-08-07 ENCOUNTER — Other Ambulatory Visit: Payer: Self-pay

## 2020-08-07 ENCOUNTER — Ambulatory Visit
Admission: RE | Admit: 2020-08-07 | Discharge: 2020-08-07 | Disposition: A | Discharge status: Home / Self Care | Payer: BC Managed Care – PPO | Source: Ambulatory Visit | Attending: Surgery | Admitting: Surgery

## 2020-08-07 DIAGNOSIS — K219 Gastro-esophageal reflux disease without esophagitis: Secondary | ICD-10-CM | POA: Diagnosis not present

## 2020-08-10 NOTE — Progress Notes (Signed)
PA APPROVAL  insurance - [employer] of [place] Method of Status - Phone  Status Phone number - [phone] Rep name - Bonita Quin Drug name - OCREVUS [phone] RN / Supply codes on this PA -N/A Case - [id number] Status of PA- Approved  Effective dates - 08/03/2020 - 08/02/2021. Site [place] NPI [id number] TIN [id number]  Additional comments- Requested copy of approval letter to be faxed

## 2020-08-19 ENCOUNTER — Encounter: Payer: Self-pay | Admitting: Skilled Nursing Facility1

## 2020-08-19 ENCOUNTER — Encounter: Payer: BC Managed Care – PPO | Attending: Surgery | Admitting: Skilled Nursing Facility1

## 2020-08-19 ENCOUNTER — Other Ambulatory Visit: Payer: Self-pay

## 2020-08-19 DIAGNOSIS — E1051 Type 1 diabetes mellitus with diabetic peripheral angiopathy without gangrene: Secondary | ICD-10-CM | POA: Diagnosis not present

## 2020-08-19 NOTE — Progress Notes (Signed)
Nutrition Assessment for Bariatric Surgery Medical Nutrition Therapy Appt Start Time: 11:00    End Time: 12:10  Patient was seen on 08/19/2020 for Pre-Operative Nutrition Assessment. Letter of approval faxed to [facility] bariatric surgery program coordinator on 08/19/2020.   Referral stated Supervised Weight Loss (SWL) visits needed: 0  Pt completed visits.   Pt has cleared nutrition requirements.      Planned surgery: Sleeve gastrectomy Pt expectation of surgery: to lose weight Pt expectation of dietitian: none stated    NUTRITION ASSESSMENT   Anthropometrics  Start weight at [facility]: 395.6 lbs (date: 08/19/2020)  Height: 72 in BMI: 53.65 kg/m2     Clinical  Medical hx: BKA, Type 1 diabetes on insulin pump/CGM, GERD Medications: beef supplements   Labs: vitmain D 34.89, HDL 36, A1C 7.1, RBC 3.74, Hemoglobin 12, HCT 35.6 Notable signs/symptoms: in wheel chair due to ill fitting prosthesis  Any previous deficiencies? No  Micronutrient Nutrition Focused Physical Exam: Hair: No issues observed Eyes: No issues observed Mouth: No issues observed Neck: No issues observed Nails: No issues observed Skin: No issues observed  Lifestyle & Dietary Hx  Pt arrives with his wife in a wheel chair.  Pt states he recently gave up alcohol.   Pt states he has been working on his portion control which has reduced his weight by 40 pounds strategy being less food on the plate in the beginning.  Pt states he has not had as much of an appetite for the past 3 years.  Pt states they ave also cut out fried foods.  Pt states he is recognizing needs to eat even though he does not feel much of an appetite.  Pt states he has a low blood sugar once a week.   Pts wife is seemingly very supportive.   24-Hr Dietary Recall First Meal: chicken biscuit or 2 sausage biscuit  Snack:  Second Meal: skipped or subway or burger Wake or chicken or noodles + cheese + meat Snack:  Third Meal:  chicken or frozen pizza  Snack:  Beverages: diet soda, sugar free green tea, water   Estimated Energy Needs Calories: 1500   NUTRITION DIAGNOSIS  Overweight/obesity ([place]-3.3) related to past poor dietary habits and physical inactivity as evidenced by patient w/ planned sleeve gastrectomy surgery following dietary guidelines for continued weight loss.    NUTRITION INTERVENTION  Nutrition counseling (C-1) and education (E-2) to facilitate bariatric surgery goals.  Educated pt on micronutrient deficiencies post surgery and strategies to mitigate that risk   Pre-Op Goals Reviewed with the Patient Track food and beverage intake (pen and paper, MyFitness Pal, Baritastic app, etc.) Make healthy food choices while monitoring portion sizes Consume 3 meals per day or try to eat every 3-5 hours Avoid concentrated sugars and fried foods Keep sugar & fat in the single digits per serving on food labels Practice CHEWING your food (aim for applesauce consistency) Practice not drinking 15 minutes before, during, and 30 minutes after each meal and snack Avoid all carbonated beverages (ex: soda, sparkling beverages)  Limit caffeinated beverages (ex: coffee, tea, energy drinks) Avoid all sugar-sweetened beverages (ex: regular soda, sports drinks)  Avoid alcohol  Aim for 64-100 ounces of FLUID daily (with at least half of fluid intake being plain water)  Aim for at least 60-80 grams of PROTEIN daily Look for a liquid protein source that contains ?15 g protein and ?5 g carbohydrate (ex: shakes, drinks, shots) Make a list of non-food related activities Physical activity is  an important part of a healthy lifestyle so keep it moving! The goal is to reach 150 minutes of exercise per week, including cardiovascular and weight baring activity.  *Goals that are bolded indicate the pt would like to start working towards these  Handouts Provided Include  Bariatric Surgery handouts (Nutrition Visits, Pre-Op  Goals, Protein Shakes, Vitamins & Minerals)  Learning Style & Readiness for Change Teaching method utilized: Visual & Auditory  Demonstrated degree of understanding via: Teach Back  Readiness Level: Action Barriers to learning/adherence to lifestyle change: none identified      MONITORING & EVALUATION Dietary intake, weekly physical activity, body weight, and pre-op goals reached at next nutrition visit.    Next Steps  Patient is to follow up at [facility] for Pre-Op Class >2 weeks before surgery for further nutrition education.  Pt has completed visits. No further supervised visits required/recomended

## 2020-08-26 DIAGNOSIS — G35 Multiple sclerosis: Secondary | ICD-10-CM | POA: Diagnosis not present

## 2020-09-01 ENCOUNTER — Other Ambulatory Visit: Payer: Self-pay | Admitting: Physician Assistant

## 2020-09-01 DIAGNOSIS — I152 Hypertension secondary to endocrine disorders: Secondary | ICD-10-CM

## 2020-09-01 DIAGNOSIS — E1159 Type 2 diabetes mellitus with other circulatory complications: Secondary | ICD-10-CM

## 2020-09-03 DIAGNOSIS — Z794 Long term (current) use of insulin: Secondary | ICD-10-CM | POA: Diagnosis not present

## 2020-09-03 DIAGNOSIS — E109 Type 1 diabetes mellitus without complications: Secondary | ICD-10-CM | POA: Diagnosis not present

## 2020-09-07 DIAGNOSIS — M5459 Other low back pain: Secondary | ICD-10-CM | POA: Diagnosis not present

## 2020-09-11 DIAGNOSIS — Z89512 Acquired absence of left leg below knee: Secondary | ICD-10-CM | POA: Diagnosis not present

## 2020-09-17 ENCOUNTER — Telehealth: Payer: Self-pay | Admitting: Neurology

## 2020-09-17 ENCOUNTER — Other Ambulatory Visit: Payer: Self-pay | Admitting: Neurology

## 2020-09-17 MED ORDER — DIAZEPAM 5 MG PO TABS: ORAL_TABLET | ORAL | 0 refills | Status: DC

## 2020-09-17 NOTE — Telephone Encounter (Signed)
Pt wife called, her husband is having an MRI tomorrow and he needs something to calm him down. She would like soemthing called in for him.

## 2020-09-18 ENCOUNTER — Other Ambulatory Visit: Payer: BC Managed Care – PPO

## 2020-10-01 ENCOUNTER — Telehealth: Payer: Self-pay

## 2020-10-01 NOTE — Telephone Encounter (Signed)
Fax and Phone call received from [facility].  Please send last ov notes for pt and last labs 07/2020.  Last OV note 07/2523 and labs faxed to [facility] [phone]

## 2020-10-05 ENCOUNTER — Ambulatory Visit (INDEPENDENT_AMBULATORY_CARE_PROVIDER_SITE_OTHER): Payer: BC Managed Care – PPO | Admitting: Psychology

## 2020-10-05 DIAGNOSIS — F509 Eating disorder, unspecified: Secondary | ICD-10-CM | POA: Diagnosis not present

## 2020-10-19 ENCOUNTER — Ambulatory Visit (INDEPENDENT_AMBULATORY_CARE_PROVIDER_SITE_OTHER): Payer: BC Managed Care – PPO | Admitting: Psychology

## 2020-10-19 DIAGNOSIS — F509 Eating disorder, unspecified: Secondary | ICD-10-CM

## 2020-10-28 ENCOUNTER — Other Ambulatory Visit: Payer: Self-pay | Admitting: Physician Assistant

## 2020-10-28 DIAGNOSIS — E1159 Type 2 diabetes mellitus with other circulatory complications: Secondary | ICD-10-CM

## 2020-10-28 DIAGNOSIS — I152 Hypertension secondary to endocrine disorders: Secondary | ICD-10-CM

## 2020-10-29 MED ORDER — METOPROLOL SUCCINATE ER 100 MG PO TB24: 100.0000 mg | ORAL_TABLET | Freq: Every day | ORAL | 0 refills | Status: DC

## 2020-11-19 NOTE — Progress Notes (Signed)
Sent message, via epic in basket, requesting orders in epic from surgeon.  

## 2020-11-23 ENCOUNTER — Ambulatory Visit: Payer: Self-pay | Admitting: Surgery

## 2020-11-23 NOTE — Progress Notes (Addendum)
Anesthesia Review:  PCP: DR Jiles Harold - [facility] 07/29/20  Endocrinologist- DR Shawnee Knapp- [facility]  07/22/20  Cardiologist : DR Colin Broach- 07/11/2019  Pulmonology- Dr Craige Cotta- [facility]- 05/23/20.  Chest x-ray :05/10/20  EKG :11/24/20  Echo : 05/01/20  Stress test:07/17/19  Cardiac Cath :  Activity level: cannot do a flight of stairs without difficulty  Sleep Study/ CPAP : none  Fasting Blood Sugar :      / Checks Blood Sugar -- times a day:   Blood Thinner/ Instructions /Last Dose: ASA / Instructions/ Last Dose :   DM- type 1 Insulin Pump  Also calibrates with glucometer at home  Hgba1c- 11/24/20- 7.6 - routed to DR Daphine Deutscher.  Left above knee amputation  Instructed pt to talk with DR Joaquin Bend regarding surgery and Insulin Pump guidelines for surgery.  PT voiced understanding.  PT has copy of Insulin Pump contract with him .  Alson on front of chart along with Insulin Pump Policy. PT aware to bring Insulin Pump supplies day of surgery  Blood pressure at preop  Right upper arm- 155/111 Right lower arm was 168/105.   LEft lower arm- 164/96.  PT reported taking blood pressure meds am of preop.  PT denies any chest pain, headache , dizziness or shortness of breath.  Instructed pt to contact PCp, DR Abonza in regards to blood pressure readings at preop appt.  PT voiced understanding.   HX of MRSA  COVID TEST - 12/11/2020

## 2020-11-23 NOTE — Progress Notes (Addendum)
DUE TO COVID-19 ONLY ONE VISITOR IS ALLOWED TO COME WITH YOU AND STAY IN THE WAITING ROOM ONLY DURING PRE OP AND PROCEDURE DAY OF SURGERY. THE 1 VISITOR  MAY VISIT WITH YOU AFTER SURGERY IN YOUR PRIVATE ROOM DURING VISITING HOURS ONLY!  YOU NEED TO HAVE A COVID 19 TEST ON__12/02/2020 _____ @_______ , THIS TEST MUST BE DONE BEFORE SURGERY,  COVID TESTING SITE IS AT [address] [place]. PLEASE REMAIN IN YOUR CAR THIS IS A DRIVER UP TEST. AFTER YOUR COVID TEST PLEASE WEAR A MASK OUT IN PUBLIC AND SOCIAL DISTANCE AND WASH YOUR HANDS FREQUENTLY. PLEASE ASK ALL YOUR CLOSE CONTACTS TO WEAR A MASK OUT IN PUBLIC AND SOCIAL DISTANCE AND WASH HANDS FREQUENTLY ALSO.               Michael Hutchinson  11/23/2020   Your procedure is scheduled on:    12/14/2020   Report to [facility] Main  Entrance   Report to admitting at   0515 AM     Call this number if you have problems the morning of surgery [phone]    Remember: Do not eat food , candy gum or mints :After Midnight. You may have clear liquids from midnight until __  0430am  Please complete G2 Lower sugar drink by 0430am morning of surgery.    CLEAR LIQUID DIET   Foods Allowed                                                                       Coffee and tea, regular and decaf                              Plain Jell-O any favor except red or purple                                            Fruit ices (not with fruit pulp)                                      Iced Popsicles                                     Carbonated beverages, regular and diet                                    Cranberry, grape and apple juices Sports drinks like Gatorade Lightly seasoned clear broth or consume(fat free) Sugar   _____________________________________________________________________    BRUSH YOUR TEETH MORNING OF SURGERY AND RINSE YOUR MOUTH OUT, NO CHEWING GUM CANDY OR MINTS.     Take these medicines the morning of surgery with A  SIP OF WATER:    Inhalers as usual and bring amloidpine, toprol  DO NOT TAKE ANY DIABETIC MEDICATIONS DAY OF YOUR SURGERY  You may not have any metal on your body including hair pins and              piercings  Do not wear jewelry, make-up, lotions, powders or perfumes, deodorant             Do not wear nail polish on your fingernails.  Do not shave  48 hours prior to surgery.              Men may shave face and neck.   Do not bring valuables to the hospital. [place].  Contacts, dentures or bridgework may not be worn into surgery.  Leave suitcase in the car. After surgery it may be brought to your room.     Patients discharged the day of surgery will not be allowed to drive home. IF YOU ARE HAVING SURGERY AND GOING HOME THE SAME DAY, YOU MUST HAVE AN ADULT TO DRIVE YOU HOME AND BE WITH YOU FOR 24 HOURS. YOU MAY GO HOME BY TAXI OR UBER OR ORTHERWISE, BUT AN ADULT MUST ACCOMPANY YOU HOME AND STAY WITH YOU FOR 24 HOURS.  Name and phone number of your driver:  Special Instructions: N/A              Please read over the following fact sheets you were given: _____________________________________________________________________  [facility] - Preparing for Surgery Before surgery, you can play an important role.  Because skin is not sterile, your skin needs to be as free of germs as possible.  You can reduce the number of germs on your skin by washing with CHG (chlorahexidine gluconate) soap before surgery.  CHG is an antiseptic cleaner which kills germs and bonds with the skin to continue killing germs even after washing. Please DO NOT use if you have an allergy to CHG or antibacterial soaps.  If your skin becomes reddened/irritated stop using the CHG and inform your nurse when you arrive at Short Stay. Do not shave (including legs and underarms) for at least 48 hours prior to the first CHG shower.  You may shave your  face/neck. Please follow these instructions carefully:  1.  Shower with CHG Soap the night before surgery and the  morning of Surgery.  2.  If you choose to wash your hair, wash your hair first as usual with your  normal  shampoo.  3.  After you shampoo, rinse your hair and body thoroughly to remove the  shampoo.                           4.  Use CHG as you would any other liquid soap.  You can apply chg directly  to the skin and wash                       Gently with a scrungie or clean washcloth.  5.  Apply the CHG Soap to your body ONLY FROM THE NECK DOWN.   Do not use on face/ open                           Wound or open sores. Avoid contact with eyes, ears mouth and genitals (private parts).  Wash face,  Genitals (private parts) with your normal soap.             6.  Wash thoroughly, paying special attention to the area where your surgery  will be performed.  7.  Thoroughly rinse your body with warm water from the neck down.  8.  DO NOT shower/wash with your normal soap after using and rinsing off  the CHG Soap.                9.  Pat yourself dry with a clean towel.            10.  Wear clean pajamas.            11.  Place clean sheets on your bed the night of your first shower and do not  sleep with pets. Day of Surgery : Do not apply any lotions/deodorants the morning of surgery.  Please wear clean clothes to the hospital/surgery center.  FAILURE TO FOLLOW THESE INSTRUCTIONS MAY RESULT IN THE CANCELLATION OF YOUR SURGERY PATIENT SIGNATURE_________________________________  NURSE SIGNATURE__________________________________  ________________________________________________________________________

## 2020-11-24 ENCOUNTER — Other Ambulatory Visit: Payer: Self-pay

## 2020-11-24 ENCOUNTER — Encounter (HOSPITAL_COMMUNITY)
Admission: RE | Admit: 2020-11-24 | Discharge: 2020-11-24 | Disposition: A | Discharge status: Home / Self Care | Payer: BC Managed Care – PPO | Source: Ambulatory Visit | Attending: Surgery | Admitting: Surgery

## 2020-11-24 ENCOUNTER — Encounter (HOSPITAL_COMMUNITY): Payer: Self-pay

## 2020-11-24 VITALS — BP 164/96 | HR 96 | Temp 98.4°F | Resp 16

## 2020-11-24 DIAGNOSIS — Z01818 Encounter for other preprocedural examination: Secondary | ICD-10-CM | POA: Diagnosis not present

## 2020-11-24 DIAGNOSIS — E139 Other specified diabetes mellitus without complications: Secondary | ICD-10-CM | POA: Diagnosis not present

## 2020-11-24 HISTORY — DX: Unspecified asthma, uncomplicated: J45.909

## 2020-11-24 LAB — CBC WITH DIFFERENTIAL/PLATELET
Abs Immature Granulocytes: 0.03 10*3/uL (ref 0.00–0.07)
Basophils Absolute: 0 10*3/uL (ref 0.0–0.1)
Basophils Relative: 1 %
Eosinophils Absolute: 0.2 10*3/uL (ref 0.0–0.5)
Eosinophils Relative: 2 %
HCT: 42.6 % (ref 39.0–52.0)
Hemoglobin: 13.4 g/dL (ref 13.0–17.0)
Immature Granulocytes: 1 %
Lymphocytes Relative: 13 %
Lymphs Abs: 0.8 10*3/uL (ref 0.7–4.0)
MCH: 28.6 pg (ref 26.0–34.0)
MCHC: 31.5 g/dL (ref 30.0–36.0)
MCV: 90.8 fL (ref 80.0–100.0)
Monocytes Absolute: 0.7 10*3/uL (ref 0.1–1.0)
Monocytes Relative: 11 %
Neutro Abs: 4.6 10*3/uL (ref 1.7–7.7)
Neutrophils Relative %: 72 %
Platelets: 239 10*3/uL (ref 150–400)
RBC: 4.69 MIL/uL (ref 4.22–5.81)
RDW: 13.8 % (ref 11.5–15.5)
WBC: 6.3 10*3/uL (ref 4.0–10.5)
nRBC: 0 % (ref 0.0–0.2)

## 2020-11-24 LAB — COMPREHENSIVE METABOLIC PANEL
ALT: 21 U/L (ref 0–44)
AST: 20 U/L (ref 15–41)
Albumin: 3.8 g/dL (ref 3.5–5.0)
Alkaline Phosphatase: 91 U/L (ref 38–126)
Anion gap: 7 (ref 5–15)
BUN: 15 mg/dL (ref 6–20)
CO2: 29 mmol/L (ref 22–32)
Calcium: 8.8 mg/dL — ABNORMAL LOW (ref 8.9–10.3)
Chloride: 98 mmol/L (ref 98–111)
Creatinine, Ser: 1.03 mg/dL (ref 0.61–1.24)
GFR, Estimated: >60 mL/min (ref >60)
Glucose, Bld: 214 mg/dL — ABNORMAL HIGH (ref 70–99)
Potassium: 4.5 mmol/L (ref 3.5–5.1)
Sodium: 134 mmol/L — ABNORMAL LOW (ref 135–145)
Total Bilirubin: 0.8 mg/dL (ref 0.3–1.2)
Total Protein: 7.2 g/dL (ref 6.5–8.1)

## 2020-11-24 LAB — SURGICAL PCR SCREEN
MRSA, PCR: NEGATIVE
Staphylococcus aureus: NEGATIVE

## 2020-11-24 LAB — HEMOGLOBIN A1C
Hgb A1c MFr Bld: 7.6 % — ABNORMAL HIGH (ref 4.8–5.6)
Mean Plasma Glucose: 171.42 mg/dL

## 2020-11-24 LAB — GLUCOSE, CAPILLARY: Glucose-Capillary: 200 mg/dL — ABNORMAL HIGH (ref 70–99)

## 2020-11-25 DIAGNOSIS — G35 Multiple sclerosis: Secondary | ICD-10-CM | POA: Diagnosis not present

## 2020-11-25 DIAGNOSIS — E1040 Type 1 diabetes mellitus with diabetic neuropathy, unspecified: Secondary | ICD-10-CM | POA: Diagnosis not present

## 2020-11-25 NOTE — H&P (Signed)
PROVIDER:  Katha Cabal, MD   MRN: [id number] DOB: 1975/09/17 DATE OF ENCOUNTER: 11/25/2020 Subjective    Chief Complaint: Pre-op Exam for roux en Y gastric bypass       History of Present Illness: Michael Hutchinson is a 45 y.o. male who is seen today for preop for his bariatric surgery under December 5.  During his initial visit he had indicated an interest in having a sleeve gastrectomy.  However his upper GI series showed severe gastroesophageal reflux up to the thoracic inlet although there were no mucosal changes or strictures..  They did not notice a hiatal hernia.  .  However in light of this I think it would behoove [place] to reexamine performing a Roux-en-Y gastric bypass upon him.   It seems that much of his weight gain has occurred since he has been on this insulin pump for his type 1 diabetes.  I think the bypass would hopefully make his cells more sensitive to the insulin thereby requiring less and also allowing him to lose weight.  More importantly with severe gastroesophageal reflux disease it would prevent him from having to have a two-stage operation will be to a sleeve first and then converted to a bypass.  He and his wife are going to talk about this this night tonight and then talk to April tomorrow.       Review of Systems: See HPI as well for other ROS.   ROS      Medical History: Past Medical History      Past Medical History:  Diagnosis Date   Arthritis     Asthma, unspecified asthma severity, unspecified whether complicated, unspecified whether persistent     Diabetes mellitus type 2, uncomplicated (CMS-HCC)     GERD (gastroesophageal reflux disease)     Hypertension             Patient Active Problem List  Diagnosis   Charcot's joint of foot, right   Right foot pain   Non morbid obesity due to excess calories      Past Surgical History       Past Surgical History:  Procedure Laterality Date   AMPUTATION LEG BELOW KNEE BKA Left      CHOLECYSTECTOMY       KNEE ARTHROSCOPY Left 01/10/2009        Allergies      Allergies  Allergen Reactions   Hydrochlorothiazide Unknown   Influenza Virus Vaccines Nausea and Vomiting   Lisinopril Cough              Current Outpatient Medications on File Prior to Visit  Medication Sig Dispense Refill   albuterol 90 mcg/actuation inhaler albuterol sulfate HFA 90 mcg/actuation aerosol inhaler  INHALE 1 TO 2 PUFFS INTO THE LUNGS EVERY 4 HOURS AS NEEDED FOR WHEEZING OR SHORTNESS OF BREATH.       amLODIPine (NORVASC) 5 MG tablet amlodipine 5 mg tablet  TAKE 1 TABLET BY MOUTH DAILY.       atorvastatin (LIPITOR) 40 MG tablet     0   blood glucose diagnostic test strip Use as instructed to check 3 times daily.       cholecalciferol, vitamin D3, 5,000 unit Tab 5,000 IU OTC vitamin D3 daily.       ergocalciferol, vitamin D2, 1,250 mcg (50,000 unit) capsule Take 50,000 Units by mouth every 7 (seven) days       ergocalciferol, vitamin D2, 50,000 unit capsule Take by mouth.  fluticasone furoate 100 mcg/actuation DsDv Arnuity Ellipta 100 mcg/actuation powder for inhalation  INHALE 1 PUFF INTO THE LUNGS DAILY.       gabapentin (NEURONTIN) 300 MG capsule Take 300 mg by mouth 3 (three) times daily       glucagon (GLUCAGON) 1 mg Kit Inject into the muscle.       hydroCHLOROthiazide (HYDRODIURIL) 25 MG tablet Take by mouth.       HYDROcodone-acetaminophen (NORCO) 5-325 mg tablet Take by mouth.       INSULIN LISPRO (HUMALOG SUBQ) Inject subcutaneously as directed.       lancets Use as instructed to check 3 times daily.       LANTUS SOLOSTAR pen injector (concentration 100 units/mL)     1   LISINOPRIL ORAL Take by mouth as directed.       losartan (COZAAR) 50 MG tablet Take by mouth.       losartan (COZAAR) 50 MG tablet Take 50 mg by mouth once daily       meloxicam (MOBIC) 15 MG tablet     3   metoprolol succinate (TOPROL-XL) 100 MG XL tablet metoprolol succinate ER 100 mg tablet,extended  release 24 hr  TAKE 1 TABLET BY MOUTH DAILY. TAKE WITH OR IMMEDIATELY FOLLOWING A MEAL.       PANTOPRAZOLE SODIUM (PROTONIX ORAL) Take by mouth as directed.       pen needle, diabetic 32 gauge x 5/32" Ndle Use.        No current facility-administered medications on file prior to visit.      Family History       Family History  Problem Relation Age of Onset   Breast cancer Mother     Diabetes Father     Myocardial Infarction (Heart attack) Father     Hyperlipidemia (Elevated cholesterol) Father     High blood pressure (Hypertension) Father     Breast cancer Other          Social History        Tobacco Use  Smoking Status Never  Smokeless Tobacco Former   Quit date: 12/14/1999      Social History  Social History         Socioeconomic History   Marital status: Married  Tobacco Use   Smoking status: Never   Smokeless tobacco: Former      Quit date: 12/14/1999  Vaping Use   Vaping Use: Never used  Substance and Sexual Activity   Alcohol use: No   Drug use: No   Sexual activity: Yes      Partners: Female        Objective:         Vitals:    11/25/20 1502  BP: 126/80  Pulse: 96  SpO2: 96%  Weight: (!) 182.4 kg (402 lb 3.2 oz)  Height: 182.9 cm (6')    Body mass index is 54.55 kg/m.   Physical Exam General:          Stocky white male no acute distress.  I suspect he is somewhat stoic HEENT:           Unremarkable Chest               : Clear Heart:              Sinus rhythm Breast:             Not examined Abdomen:        Protuberant with prior lap chole  and also umbilical hernia repair GU                   not examined Rectal              not performed Extremities      and prior left BKA with probable Charcot joint on the right Neuro              alert and oriented x3.  Motor and sensory function grossly intact.  Evidence of diabetes peripheral neuropathy requiring amputation of the left leg below the knee.   Labs, Imaging and Diagnostic Testing:    Upper GI report examined     Assessment and Plan:  Diagnoses and all orders for this visit:   Morbid (severe) obesity due to excess calories (CMS-HCC)   Type 1 diabetes mellitus with diabetic neuropathy (CMS-HCC)   Multiple sclerosis (CMS-HCC)       I think will likely change his surgery from a sleeve to a Roux-en-Y gastric bypass. We discussed this again in the holding area and he agrees and understands the need for roux en Y gastric bypass.   No follow-ups on file.   Nicha Hemann Charna Busman, MD

## 2020-11-27 DIAGNOSIS — Z794 Long term (current) use of insulin: Secondary | ICD-10-CM | POA: Diagnosis not present

## 2020-11-27 DIAGNOSIS — E109 Type 1 diabetes mellitus without complications: Secondary | ICD-10-CM | POA: Diagnosis not present

## 2020-11-30 ENCOUNTER — Ambulatory Visit: Payer: BC Managed Care – PPO

## 2020-11-30 ENCOUNTER — Encounter: Payer: BC Managed Care – PPO | Attending: Surgery | Admitting: Skilled Nursing Facility1

## 2020-11-30 ENCOUNTER — Ambulatory Visit: Payer: BC Managed Care – PPO | Admitting: Physician Assistant

## 2020-11-30 ENCOUNTER — Other Ambulatory Visit: Payer: Self-pay

## 2020-11-30 DIAGNOSIS — E1051 Type 1 diabetes mellitus with diabetic peripheral angiopathy without gangrene: Secondary | ICD-10-CM | POA: Diagnosis not present

## 2020-11-30 NOTE — Progress Notes (Signed)
Pre-Operative Nutrition Class:    Patient was seen on 11/30/2020 for Pre-Operative Bariatric Surgery Education at [facility].    Surgery date: 12/14/2020 Surgery type: RYGB Start weight at [facility]: 395.6 Weight today: 404.9 (with prosthesis)  Samples given per MNT protocol. Patient educated on appropriate usage: Ensure max exp: March 10, 2021 Ensure max lot: [id number]  Chewable bariatric advantage: advanced multi EA exp: 08/23 Chewable bariatric advantage: advanced multi EA lot: [id number]  Bariatric advantage calcium citrate exp: 02/23 Bariatric advantage calcium citrate lot: [id number]   The following the learning objectives were met by the patient during this course: Identify Pre-Op Dietary Goals and will begin 2 weeks pre-operatively Identify appropriate sources of fluids and proteins  State protein recommendations and appropriate sources pre and post-operatively Identify Post-Operative Dietary Goals and will follow for 2 weeks post-operatively Identify appropriate multivitamin and calcium sources Describe the need for physical activity post-operatively and will follow MD recommendations State when to call healthcare provider regarding medication questions or post-operative complications When having a diagnosis of diabetes understanding hypoglycemia symptoms and the inclusion of 1 complex carbohydrate per meal  Handouts given during class include: Pre-Op Bariatric Surgery Diet Handout Protein Shake Handout Post-Op Bariatric Surgery Nutrition Handout BELT Program Information Flyer Support Group Information Flyer [facility] Bariatric Supplements Price List  Follow-Up Plan: Patient will follow-up at [facility] 2 weeks post operatively for diet advancement per MD.

## 2020-12-01 ENCOUNTER — Other Ambulatory Visit: Payer: Self-pay

## 2020-12-01 ENCOUNTER — Other Ambulatory Visit: Payer: Self-pay | Admitting: Physician Assistant

## 2020-12-01 ENCOUNTER — Telehealth: Payer: Self-pay | Admitting: Physician Assistant

## 2020-12-01 DIAGNOSIS — I152 Hypertension secondary to endocrine disorders: Secondary | ICD-10-CM

## 2020-12-01 DIAGNOSIS — E1159 Type 2 diabetes mellitus with other circulatory complications: Secondary | ICD-10-CM

## 2020-12-01 NOTE — Telephone Encounter (Signed)
Patient called requesting his amlodipine he will not have a dose for tonight. Please advise [phone]

## 2020-12-02 DIAGNOSIS — Z794 Long term (current) use of insulin: Secondary | ICD-10-CM | POA: Diagnosis not present

## 2020-12-02 NOTE — Progress Notes (Signed)
Anesthesia Chart Review   Case: [id number] Date/Time: 12/14/20 0715   Procedure: LAPAROSCOPIC ROUX-EN-Y GASTRIC BYPASS WITH UPPER ENDOSCOPY   Anesthesia type: General   Pre-op diagnosis: MORBID OBESITY   Location: [facility] ROOM 05 / [facility]   Surgeons: Luretha Murphy, MD       DISCUSSION:45 y.o. never smoker with h/o MS, GERD, DM I (insulin pump in place), HTN, left AKA, morbid obesity scheduled for above procedure 12/14/2020 with Dr. Luretha Murphy.   Last seen by PCP 07/29/2020.   Anticipate pt can proceed with planned procedure barring acute status change.   VS: BP (!) 164/96   Pulse 96   Temp 36.9 C (Oral)   Resp 16   SpO2 100%   PROVIDERS: Mayer Masker, PA-C is PCP    LABS: Labs reviewed: Acceptable for surgery. (all labs ordered are listed, but only abnormal results are displayed)  Labs Reviewed  HEMOGLOBIN A1C - Abnormal; Notable for the following components:      Result Value   Hgb A1c MFr Bld 7.6 (*)    All other components within normal limits  COMPREHENSIVE METABOLIC PANEL - Abnormal; Notable for the following components:   Sodium 134 (*)    Glucose, Bld 214 (*)    Calcium 8.8 (*)    All other components within normal limits  GLUCOSE, CAPILLARY - Abnormal; Notable for the following components:   Glucose-Capillary 200 (*)    All other components within normal limits  SURGICAL PCR SCREEN  CBC WITH DIFFERENTIAL/PLATELET     IMAGES:   EKG: 11/24/2020 Rate 97 bpm  Normal sinus rhythm Low voltage QRS Cannot rule out Anterior infarct , age undetermined Abnormal ECG Since last tracing rate slower  CV: Echo 05/01/20  1. Challenging images.   2. Left ventricular ejection fraction, by estimation, is 60 to 65%. The  left ventricle has grossly normal function though not well visualized.  Unable to exclude regional wall motion abnormalities. Left ventricular  diastolic parameters are consistent  with Grade I diastolic dysfunction (impaired relaxation).   3.  Right ventricular systolic function is grossly normal. The right  ventricular size is grossly normal.   Stress Test 07/17/2019 There was no ST segment deviation noted during stress. The study is normal. This is a low risk study. The left ventricular ejection fraction is normal (55-65%). There is no evidence for ischemia Past Medical History:  Diagnosis Date   Asthma    Charcot's joint of foot 11/25/2013   Complication of anesthesia    "I wake up angry" (12/31/2015)   Diabetes mellitus type 1 (HCC) dx'd 1981   Diabetic ketoacidosis (HCC)    Essential hypertension 05/03/2013   GERD (gastroesophageal reflux disease)    High cholesterol    Hx MRSA infection    Inner thigh and under arm- healed areas   Meniscus tear    Pneumonia    2016 ish    Past Surgical History:  Procedure Laterality Date   AMPUTATION Left 01/01/2016   Procedure: AMPUTATION BELOW KNEE;  Surgeon: Toni Arthurs, MD;  Location: [facility];  Service: Orthopedics;  Laterality: Left;   APPLICATION OF WOUND VAC  04/26/2016   CARPAL TUNNEL WITH CUBITAL TUNNEL Right 10/22/2019   Procedure: Right carpal tunnel release, right cubital tunnel release in situ, Right pronator/proximal median nerve release, Right elbow ECRB debridement with partial ostectomy and repair as necessary;  Surgeon: Dominica Severin, MD;  Location: [facility] OR;  Service: Orthopedics;  Laterality: Right;    HERNIA REPAIR  I & D EXTREMITY Left 04/26/2016   Procedure: IRRIGATION AND DEBRIDEMENT EXTREMITY/Left Leg/Possible Wound Vac;  Surgeon: [name], MD;  Location: [facility];  Service: Orthopedics;  Laterality: Left;   INCISE AND DRAIN ABCESS Left 04/26/2016   KNEE ARTHROSCOPY Left ~ 2010   LAPAROSCOPIC CHOLECYSTECTOMY  2015   METACARPOPHALANGEAL JOINT ARTHRODESIS Left 06/2012   Fracture left index finger intra-articular MCP joint/notes 06/30/2012   OPEN REDUCTION INTERNAL FIXATION (ORIF) PROXIMAL PHALANX Left 06/30/2012   Procedure: OPEN REDUCTION INTERNAL  FIXATION (ORIF) LEFT INDEX FINGER PROXIMAL PHALANX FRACTURE WITH LIGAMENT REPAIR AS NECESSARY;  Surgeon: [name], MD;  Location: [place];  Service: Orthopedics;  Laterality: Left;   UMBILICAL HERNIA REPAIR  2015   "w/gallbladder OR"    MEDICATIONS:  albuterol (VENTOLIN HFA) 108 (90 Base) MCG/ACT inhaler   amLODipine (NORVASC) 5 MG tablet   aspirin-sod bicarb-citric acid (ALKA-SELTZER) 325 MG TBEF tablet   BAYER MICROLET LANCETS lancets   bismuth subsalicylate (PEPTO BISMOL) 262 MG/15ML suspension   Blood Glucose Monitoring Suppl (ONE TOUCH ULTRA SYSTEM KIT) w/Device KIT   diazepam (VALIUM) 5 MG tablet   Fluticasone Furoate (ARNUITY ELLIPTA) 100 MCG/ACT AEPB   glucose blood (BAYER CONTOUR NEXT TEST) test strip   HYDROcodone-acetaminophen (NORCO/VICODIN) 5-325 MG tablet   ibuprofen (ADVIL) 200 MG tablet   insulin lispro (HUMALOG) 100 UNIT/ML injection   Insulin Syringes, Disposable, U-100 1 ML MISC   losartan (COZAAR) 50 MG tablet   metFORMIN (GLUCOPHAGE-XR) 500 MG 24 hr tablet   metoprolol succinate (TOPROL-XL) 100 MG 24 hr tablet   Ocrelizumab (OCREVUS IV)   Oxymetazoline HCl (MUCINEX NASAL SPRAY FULL FORCE NA)   Vitamin D, Ergocalciferol, (DRISDOL) 1.25 MG (50000 UNIT) CAPS capsule   No current facility-administered medications for this encounter.    Konrad Felix Ward, PA-C [facility] [phone]

## 2020-12-02 NOTE — Telephone Encounter (Signed)
Patient was sent another 30 day supply of medications to get him to follow up scheduled in December. He was strongly encouraged to make his appointment. No more medication refills will be provided if patient does not make appointment.

## 2020-12-11 ENCOUNTER — Other Ambulatory Visit: Payer: Self-pay | Admitting: Surgery

## 2020-12-12 LAB — SARS CORONAVIRUS 2 (TAT 6-24 HRS): SARS Coronavirus 2: NEGATIVE

## 2020-12-14 ENCOUNTER — Encounter (HOSPITAL_COMMUNITY)
Admission: RE | Disposition: A | Discharge status: Home / Self Care | Payer: Self-pay | Source: Ambulatory Visit | Attending: Surgery

## 2020-12-14 ENCOUNTER — Inpatient Hospital Stay (HOSPITAL_COMMUNITY)
Admission: RE | Admit: 2020-12-14 | Discharge: 2020-12-15 | DRG: 621 | Disposition: A | Discharge status: Home / Self Care | Payer: BC Managed Care – PPO | Source: Ambulatory Visit | Attending: Surgery | Admitting: Surgery

## 2020-12-14 ENCOUNTER — Other Ambulatory Visit: Payer: Self-pay

## 2020-12-14 ENCOUNTER — Inpatient Hospital Stay (HOSPITAL_COMMUNITY): Payer: BC Managed Care – PPO | Admitting: Physician Assistant

## 2020-12-14 ENCOUNTER — Inpatient Hospital Stay (HOSPITAL_COMMUNITY): Payer: BC Managed Care – PPO | Admitting: Certified Registered"

## 2020-12-14 ENCOUNTER — Encounter (HOSPITAL_COMMUNITY): Payer: Self-pay | Admitting: Surgery

## 2020-12-14 DIAGNOSIS — G35 Multiple sclerosis: Secondary | ICD-10-CM | POA: Diagnosis present

## 2020-12-14 DIAGNOSIS — M1990 Unspecified osteoarthritis, unspecified site: Secondary | ICD-10-CM | POA: Diagnosis not present

## 2020-12-14 DIAGNOSIS — Z9884 Bariatric surgery status: Secondary | ICD-10-CM

## 2020-12-14 DIAGNOSIS — Z01818 Encounter for other preprocedural examination: Secondary | ICD-10-CM

## 2020-12-14 DIAGNOSIS — Z888 Allergy status to other drugs, medicaments and biological substances status: Secondary | ICD-10-CM

## 2020-12-14 DIAGNOSIS — Z6841 Body Mass Index (BMI) 40.0 and over, adult: Secondary | ICD-10-CM | POA: Diagnosis not present

## 2020-12-14 DIAGNOSIS — Z791 Long term (current) use of non-steroidal anti-inflammatories (NSAID): Secondary | ICD-10-CM | POA: Diagnosis not present

## 2020-12-14 DIAGNOSIS — E7800 Pure hypercholesterolemia, unspecified: Secondary | ICD-10-CM | POA: Diagnosis not present

## 2020-12-14 DIAGNOSIS — Z887 Allergy status to serum and vaccine status: Secondary | ICD-10-CM

## 2020-12-14 DIAGNOSIS — Z833 Family history of diabetes mellitus: Secondary | ICD-10-CM

## 2020-12-14 DIAGNOSIS — J45909 Unspecified asthma, uncomplicated: Secondary | ICD-10-CM | POA: Diagnosis not present

## 2020-12-14 DIAGNOSIS — K219 Gastro-esophageal reflux disease without esophagitis: Secondary | ICD-10-CM | POA: Diagnosis present

## 2020-12-14 DIAGNOSIS — Z89512 Acquired absence of left leg below knee: Secondary | ICD-10-CM | POA: Diagnosis not present

## 2020-12-14 DIAGNOSIS — Z9641 Presence of insulin pump (external) (internal): Secondary | ICD-10-CM | POA: Diagnosis present

## 2020-12-14 DIAGNOSIS — Z8249 Family history of ischemic heart disease and other diseases of the circulatory system: Secondary | ICD-10-CM | POA: Diagnosis not present

## 2020-12-14 DIAGNOSIS — M14671 Charcot's joint, right ankle and foot: Secondary | ICD-10-CM | POA: Diagnosis not present

## 2020-12-14 DIAGNOSIS — Z87891 Personal history of nicotine dependence: Secondary | ICD-10-CM

## 2020-12-14 DIAGNOSIS — Z794 Long term (current) use of insulin: Secondary | ICD-10-CM

## 2020-12-14 DIAGNOSIS — Z79899 Other long term (current) drug therapy: Secondary | ICD-10-CM | POA: Diagnosis not present

## 2020-12-14 DIAGNOSIS — E1042 Type 1 diabetes mellitus with diabetic polyneuropathy: Secondary | ICD-10-CM | POA: Diagnosis not present

## 2020-12-14 DIAGNOSIS — E109 Type 1 diabetes mellitus without complications: Secondary | ICD-10-CM | POA: Diagnosis not present

## 2020-12-14 DIAGNOSIS — I10 Essential (primary) hypertension: Secondary | ICD-10-CM | POA: Diagnosis not present

## 2020-12-14 HISTORY — PX: GASTRIC ROUX-EN-Y: SHX5262

## 2020-12-14 LAB — TYPE AND SCREEN
ABO/RH(D): B POS
Antibody Screen: NEGATIVE

## 2020-12-14 LAB — CBC
HCT: 39.6 % (ref 39.0–52.0)
Hemoglobin: 13.2 g/dL (ref 13.0–17.0)
MCH: 29.5 pg (ref 26.0–34.0)
MCHC: 33.3 g/dL (ref 30.0–36.0)
MCV: 88.6 fL (ref 80.0–100.0)
Platelets: 265 10*3/uL (ref 150–400)
RBC: 4.47 MIL/uL (ref 4.22–5.81)
RDW: 13.3 % (ref 11.5–15.5)
WBC: 13.5 10*3/uL — ABNORMAL HIGH (ref 4.0–10.5)
nRBC: 0 % (ref 0.0–0.2)

## 2020-12-14 LAB — GLUCOSE, CAPILLARY
Glucose-Capillary: 223 mg/dL — ABNORMAL HIGH (ref 70–99)
Glucose-Capillary: 271 mg/dL — ABNORMAL HIGH (ref 70–99)
Glucose-Capillary: 279 mg/dL — ABNORMAL HIGH (ref 70–99)
Glucose-Capillary: 282 mg/dL — ABNORMAL HIGH (ref 70–99)

## 2020-12-14 LAB — CREATININE, SERUM
Creatinine, Ser: 1.12 mg/dL (ref 0.61–1.24)
GFR, Estimated: >60 mL/min (ref >60)

## 2020-12-14 SURGERY — LAPAROSCOPIC ROUX-EN-Y GASTRIC BYPASS WITH UPPER ENDOSCOPY
Anesthesia: General | Site: Abdomen

## 2020-12-14 MED ORDER — PROPOFOL 10 MG/ML IV BOLUS
INTRAVENOUS | Status: AC
  Filled 2020-12-14: qty 20

## 2020-12-14 MED ORDER — KETAMINE HCL-SODIUM CHLORIDE 100-0.9 MG/10ML-% IV SOSY
PREFILLED_SYRINGE | INTRAVENOUS | Status: AC
  Filled 2020-12-14: qty 10

## 2020-12-14 MED ORDER — FENTANYL CITRATE (PF) 100 MCG/2ML IJ SOLN
INTRAMUSCULAR | Status: DC | PRN
  Administered 2020-12-14: 100 ug via INTRAVENOUS
  Administered 2020-12-14 (×2): 50 ug via INTRAVENOUS

## 2020-12-14 MED ORDER — SODIUM CHLORIDE 0.9 % IV SOLN
INTRAVENOUS | Status: DC | PRN
  Administered 2020-12-14: 30 mL

## 2020-12-14 MED ORDER — LIDOCAINE 2% (20 MG/ML) 5 ML SYRINGE
INTRAMUSCULAR | Status: DC | PRN
  Administered 2020-12-14: 100 mg via INTRAVENOUS

## 2020-12-14 MED ORDER — ENOXAPARIN (LOVENOX) PATIENT EDUCATION KIT
PACK | Freq: Once | Status: AC
  Filled 2020-12-14: qty 1

## 2020-12-14 MED ORDER — ONDANSETRON HCL 4 MG/2ML IJ SOLN: 4.0000 mg | INTRAMUSCULAR | Status: DC | PRN

## 2020-12-14 MED ORDER — ORAL CARE MOUTH RINSE: 15.0000 mL | Freq: Once | OROMUCOSAL | Status: AC

## 2020-12-14 MED ORDER — HEPARIN SODIUM (PORCINE) 5000 UNIT/ML IJ SOLN
5000.0000 [IU] | INTRAMUSCULAR | Status: AC
  Administered 2020-12-14: 5000 [IU] via SUBCUTANEOUS
  Filled 2020-12-14: qty 1

## 2020-12-14 MED ORDER — HEPARIN SODIUM (PORCINE) 5000 UNIT/ML IJ SOLN
5000.0000 [IU] | Freq: Three times a day (TID) | INTRAMUSCULAR | Status: DC
  Administered 2020-12-14 – 2020-12-15 (×4): 5000 [IU] via SUBCUTANEOUS
  Filled 2020-12-14 (×4): qty 1

## 2020-12-14 MED ORDER — OXYCODONE HCL 5 MG/5ML PO SOLN
5.0000 mg | Freq: Four times a day (QID) | ORAL | Status: DC | PRN
  Administered 2020-12-14 – 2020-12-15 (×4): 5 mg via ORAL
  Filled 2020-12-14 (×4): qty 5

## 2020-12-14 MED ORDER — TISSEEL VH 10 ML EX KIT
PACK | CUTANEOUS | Status: AC
  Filled 2020-12-14: qty 1

## 2020-12-14 MED ORDER — PANTOPRAZOLE SODIUM 40 MG IV SOLR
40.0000 mg | Freq: Every day | INTRAVENOUS | Status: DC
  Administered 2020-12-14: 40 mg via INTRAVENOUS
  Filled 2020-12-14 (×2): qty 40

## 2020-12-14 MED ORDER — KCL IN DEXTROSE-NACL 20-5-0.45 MEQ/L-%-% IV SOLN
INTRAVENOUS | Status: DC
  Filled 2020-12-14 (×2): qty 1000

## 2020-12-14 MED ORDER — HYDROMORPHONE HCL 1 MG/ML IJ SOLN
0.2500 mg | INTRAMUSCULAR | Status: DC | PRN
  Administered 2020-12-14: 0.5 mg via INTRAVENOUS

## 2020-12-14 MED ORDER — PHENYLEPHRINE HCL-NACL 20-0.9 MG/250ML-% IV SOLN
INTRAVENOUS | Status: DC | PRN
  Administered 2020-12-14: 50 ug/min via INTRAVENOUS

## 2020-12-14 MED ORDER — MIDAZOLAM HCL 2 MG/2ML IJ SOLN
INTRAMUSCULAR | Status: DC | PRN
Start: 2020-12-14 — End: 2020-12-14
  Administered 2020-12-14: 2 mg via INTRAVENOUS

## 2020-12-14 MED ORDER — SUGAMMADEX SODIUM 500 MG/5ML IV SOLN
INTRAVENOUS | Status: AC
  Filled 2020-12-14: qty 5

## 2020-12-14 MED ORDER — LACTATED RINGERS IV SOLN: INTRAVENOUS | Status: DC

## 2020-12-14 MED ORDER — 0.9 % SODIUM CHLORIDE (POUR BTL) OPTIME
TOPICAL | Status: DC | PRN
  Administered 2020-12-14: 1000 mL

## 2020-12-14 MED ORDER — BUPIVACAINE LIPOSOME 1.3 % IJ SUSP: 20.0000 mL | Freq: Once | INTRAMUSCULAR | Status: DC

## 2020-12-14 MED ORDER — PHENYLEPHRINE 40 MCG/ML (10ML) SYRINGE FOR IV PUSH (FOR BLOOD PRESSURE SUPPORT)
PREFILLED_SYRINGE | INTRAVENOUS | Status: DC | PRN
  Administered 2020-12-14 (×2): 80 ug via INTRAVENOUS
  Administered 2020-12-14: 40 ug via INTRAVENOUS
  Administered 2020-12-14: 80 ug via INTRAVENOUS

## 2020-12-14 MED ORDER — SODIUM CHLORIDE (PF) 0.9 % IJ SOLN
INTRAMUSCULAR | Status: AC
  Filled 2020-12-14: qty 20

## 2020-12-14 MED ORDER — HYDROMORPHONE HCL 1 MG/ML IJ SOLN
INTRAMUSCULAR | Status: AC
  Filled 2020-12-14: qty 2

## 2020-12-14 MED ORDER — LACTATED RINGERS IR SOLN
Status: DC | PRN
  Administered 2020-12-14: 1

## 2020-12-14 MED ORDER — SUGAMMADEX SODIUM 500 MG/5ML IV SOLN
INTRAVENOUS | Status: DC | PRN
  Administered 2020-12-14: 400 mg via INTRAVENOUS

## 2020-12-14 MED ORDER — TISSEEL VH 10 ML EX KIT
PACK | CUTANEOUS | Status: DC | PRN
  Administered 2020-12-14: 2

## 2020-12-14 MED ORDER — APREPITANT 40 MG PO CAPS
40.0000 mg | ORAL_CAPSULE | ORAL | Status: AC
  Administered 2020-12-14: 40 mg via ORAL
  Filled 2020-12-14: qty 1

## 2020-12-14 MED ORDER — FENTANYL CITRATE (PF) 250 MCG/5ML IJ SOLN
INTRAMUSCULAR | Status: AC
  Filled 2020-12-14: qty 5

## 2020-12-14 MED ORDER — ONDANSETRON HCL 4 MG/2ML IJ SOLN
INTRAMUSCULAR | Status: DC | PRN
  Administered 2020-12-14: 4 mg via INTRAVENOUS

## 2020-12-14 MED ORDER — ACETAMINOPHEN 500 MG PO TABS
1000.0000 mg | ORAL_TABLET | ORAL | Status: AC
  Administered 2020-12-14: 1000 mg via ORAL
  Filled 2020-12-14: qty 2

## 2020-12-14 MED ORDER — MIDAZOLAM HCL 2 MG/2ML IJ SOLN
INTRAMUSCULAR | Status: AC
  Filled 2020-12-14: qty 2

## 2020-12-14 MED ORDER — ENSURE MAX PROTEIN PO LIQD
2.0000 [oz_av] | ORAL | Status: DC
  Administered 2020-12-15 (×4): 2 [oz_av] via ORAL

## 2020-12-14 MED ORDER — CHLORHEXIDINE GLUCONATE 0.12 % MT SOLN
15.0000 mL | Freq: Once | OROMUCOSAL | Status: AC
  Administered 2020-12-14: 15 mL via OROMUCOSAL

## 2020-12-14 MED ORDER — KETAMINE HCL 10 MG/ML IJ SOLN
INTRAMUSCULAR | Status: DC | PRN
  Administered 2020-12-14: 20 mg via INTRAVENOUS
  Administered 2020-12-14: 15 mg via INTRAVENOUS

## 2020-12-14 MED ORDER — OXYCODONE HCL 5 MG/5ML PO SOLN: 5.0000 mg | Freq: Once | ORAL | Status: DC | PRN

## 2020-12-14 MED ORDER — ACETAMINOPHEN 160 MG/5ML PO SOLN
1000.0000 mg | Freq: Three times a day (TID) | ORAL | Status: DC
  Administered 2020-12-14: 1000 mg via ORAL
  Filled 2020-12-14: qty 40.6

## 2020-12-14 MED ORDER — LIDOCAINE HCL (PF) 2 % IJ SOLN
INTRAMUSCULAR | Status: DC | PRN
  Administered 2020-12-14: 1.5 mg/kg/h via INTRADERMAL

## 2020-12-14 MED ORDER — ACETAMINOPHEN 500 MG PO TABS
1000.0000 mg | ORAL_TABLET | Freq: Three times a day (TID) | ORAL | Status: DC
  Administered 2020-12-14 – 2020-12-15 (×3): 1000 mg via ORAL
  Filled 2020-12-14 (×3): qty 2

## 2020-12-14 MED ORDER — SUCCINYLCHOLINE CHLORIDE 200 MG/10ML IV SOSY
PREFILLED_SYRINGE | INTRAVENOUS | Status: DC | PRN
  Administered 2020-12-14: 200 mg via INTRAVENOUS

## 2020-12-14 MED ORDER — OXYCODONE HCL 5 MG PO TABS: 5.0000 mg | ORAL_TABLET | Freq: Once | ORAL | Status: DC | PRN

## 2020-12-14 MED ORDER — PROMETHAZINE HCL 25 MG/ML IJ SOLN: 6.2500 mg | INTRAMUSCULAR | Status: DC | PRN

## 2020-12-14 MED ORDER — SODIUM CHLORIDE 0.9 % IV SOLN
2.0000 g | INTRAVENOUS | Status: AC
  Administered 2020-12-14: 2 g via INTRAVENOUS
  Filled 2020-12-14: qty 2

## 2020-12-14 MED ORDER — SCOPOLAMINE 1 MG/3DAYS TD PT72
1.0000 | MEDICATED_PATCH | TRANSDERMAL | Status: DC
  Administered 2020-12-14: 1.5 mg via TRANSDERMAL
  Filled 2020-12-14: qty 1

## 2020-12-14 MED ORDER — PROPOFOL 10 MG/ML IV BOLUS
INTRAVENOUS | Status: DC | PRN
  Administered 2020-12-14: 270 mg via INTRAVENOUS

## 2020-12-14 MED ORDER — SIMETHICONE 80 MG PO CHEW
80.0000 mg | CHEWABLE_TABLET | Freq: Four times a day (QID) | ORAL | Status: DC | PRN
  Administered 2020-12-14 – 2020-12-15 (×4): 80 mg via ORAL
  Filled 2020-12-14 (×4): qty 1

## 2020-12-14 MED ORDER — MORPHINE SULFATE (PF) 2 MG/ML IV SOLN: 1.0000 mg | INTRAVENOUS | Status: DC | PRN

## 2020-12-14 MED ORDER — CHLORHEXIDINE GLUCONATE CLOTH 2 % EX PADS: 6.0000 | MEDICATED_PAD | Freq: Once | CUTANEOUS | Status: DC

## 2020-12-14 MED ORDER — BUPIVACAINE LIPOSOME 1.3 % IJ SUSP
INTRAMUSCULAR | Status: AC
  Filled 2020-12-14: qty 20

## 2020-12-14 MED ORDER — ROCURONIUM BROMIDE 10 MG/ML (PF) SYRINGE
PREFILLED_SYRINGE | INTRAVENOUS | Status: DC | PRN
  Administered 2020-12-14: 20 mg via INTRAVENOUS
  Administered 2020-12-14: 10 mg via INTRAVENOUS
  Administered 2020-12-14 (×2): 20 mg via INTRAVENOUS
  Administered 2020-12-14: 50 mg via INTRAVENOUS
  Administered 2020-12-14: 10 mg via INTRAVENOUS

## 2020-12-14 MED ORDER — LIDOCAINE HCL (PF) 2 % IJ SOLN
INTRAMUSCULAR | Status: AC
  Filled 2020-12-14: qty 10

## 2020-12-14 SURGICAL SUPPLY — 86 items
ADH SKN CLS APL DERMABOND .7 (GAUZE/BANDAGES/DRESSINGS) ×1
APL SRG 32X5 SNPLK LF DISP (MISCELLANEOUS) ×1
APL SWBSTK 6 STRL LF DISP (MISCELLANEOUS) ×2
APPLICATOR COTTON TIP 6 STRL (MISCELLANEOUS) ×2 IMPLANT
APPLICATOR COTTON TIP 6IN STRL (MISCELLANEOUS) ×4
APPLIER CLIP ROT 10 11.4 M/L (STAPLE)
APPLIER CLIP ROT 13.4 12 LRG (CLIP)
APR CLP LRG 13.4X12 ROT 20 MLT (CLIP)
APR CLP MED LRG 11.4X10 (STAPLE)
BAG COUNTER SPONGE SURGICOUNT (BAG) IMPLANT
BAG SPNG CNTER NS LX DISP (BAG)
BLADE SURG 15 STRL LF DISP TIS (BLADE) ×1 IMPLANT
BLADE SURG 15 STRL SS (BLADE) ×2
CABLE HIGH FREQUENCY MONO STRZ (ELECTRODE) IMPLANT
CLIP APPLIE ROT 10 11.4 M/L (STAPLE) IMPLANT
CLIP APPLIE ROT 13.4 12 LRG (CLIP) IMPLANT
CLIP SUT LAPRA TY ABSORB (SUTURE) ×2 IMPLANT
DERMABOND ADVANCED (GAUZE/BANDAGES/DRESSINGS) ×1
DERMABOND ADVANCED .7 DNX12 (GAUZE/BANDAGES/DRESSINGS) IMPLANT
DEVICE SUT QUICK LOAD TK 5 (STAPLE) IMPLANT
DEVICE SUT TI-KNOT TK 5X26 (MISCELLANEOUS) IMPLANT
DEVICE SUTURE ENDOST 10MM (ENDOMECHANICALS) ×2 IMPLANT
DISSECTOR BLUNT TIP ENDO 5MM (MISCELLANEOUS) IMPLANT
DRAIN PENROSE 0.25X18 (DRAIN) ×2 IMPLANT
GAUZE 4X4 16PLY ~~LOC~~+RFID DBL (SPONGE) ×2 IMPLANT
GLOVE SURG ENC MOIS LTX SZ6.5 (GLOVE) ×1 IMPLANT
GLOVE SURG ENC MOIS LTX SZ7 (GLOVE) ×1 IMPLANT
GLOVE SURG ENC TEXT LTX SZ8 (GLOVE) ×2 IMPLANT
GLOVE SURG POLY ORTHO LF SZ7.5 (GLOVE) ×3 IMPLANT
GLOVE SURG POLYISO LF SZ7 (GLOVE) ×2 IMPLANT
GLOVE SURG UNDER POLY LF SZ6 (GLOVE) ×1 IMPLANT
GLOVE SURG UNDER POLY LF SZ7 (GLOVE) ×2 IMPLANT
GOWN STRL REUS W/TWL LRG LVL3 (GOWN DISPOSABLE) ×1 IMPLANT
GOWN STRL REUS W/TWL XL LVL3 (GOWN DISPOSABLE) ×5 IMPLANT
HANDLE STAPLE EGIA 4 XL (STAPLE) ×2 IMPLANT
IRRIG SUCT STRYKERFLOW 2 WTIP (MISCELLANEOUS) ×2
IRRIGATION SUCT STRKRFLW 2 WTP (MISCELLANEOUS) ×1 IMPLANT
IV LACTATED RINGERS 1000ML (IV SOLUTION) ×1 IMPLANT
KIT BASIN OR (CUSTOM PROCEDURE TRAY) ×2 IMPLANT
KIT GASTRIC LAVAGE 34FR ADT (SET/KITS/TRAYS/PACK) ×2 IMPLANT
KIT TURNOVER KIT A (KITS) IMPLANT
MARKER SKIN DUAL TIP RULER LAB (MISCELLANEOUS) ×2 IMPLANT
MAT PREVALON FULL STRYKER (MISCELLANEOUS) ×2 IMPLANT
NDL SPNL 22GX3.5 QUINCKE BK (NEEDLE) ×1 IMPLANT
NEEDLE SPNL 22GX3.5 QUINCKE BK (NEEDLE) ×2 IMPLANT
NS IRRIG 1000ML POUR BTL (IV SOLUTION) ×2 IMPLANT
PACK CARDIOVASCULAR III (CUSTOM PROCEDURE TRAY) ×2 IMPLANT
PENCIL SMOKE EVACUATOR (MISCELLANEOUS) IMPLANT
RELOAD EGIA 45 MED/THCK PURPLE (STAPLE) ×1 IMPLANT
RELOAD EGIA 45 TAN VASC (STAPLE) IMPLANT
RELOAD EGIA 60 MED/THCK PURPLE (STAPLE) IMPLANT
RELOAD EGIA 60 TAN VASC (STAPLE) ×1 IMPLANT
RELOAD ENDO STITCH 2.0 (ENDOMECHANICALS) ×18
RELOAD STAPLE 45 PURP MED/THCK (STAPLE) IMPLANT
RELOAD STAPLE 60 MED/THCK ART (STAPLE) IMPLANT
RELOAD SUT SNGL STCH BLK 2-0 (ENDOMECHANICALS) ×4 IMPLANT
RELOAD TRI 45 ART MED THCK PUR (STAPLE) IMPLANT
RELOAD TRI 60 ART MED THCK PUR (STAPLE) ×5 IMPLANT
SCISSORS LAP 5X45 EPIX DISP (ENDOMECHANICALS) ×2 IMPLANT
SEALANT SURGICAL APPL DUAL CAN (MISCELLANEOUS) ×2 IMPLANT
SET TUBE SMOKE EVAC HIGH FLOW (TUBING) ×2 IMPLANT
SHEARS HARMONIC ACE PLUS 45CM (MISCELLANEOUS) ×2 IMPLANT
SLEEVE ADV FIXATION 12X100MM (TROCAR) ×4 IMPLANT
SLEEVE ADV FIXATION 5X100MM (TROCAR) IMPLANT
SOL ANTI FOG 6CC (MISCELLANEOUS) ×1 IMPLANT
SOLUTION ANTI FOG 6CC (MISCELLANEOUS) ×1
STAPLER VISISTAT 35W (STAPLE) IMPLANT
SUT MNCRL AB 4-0 PS2 18 (SUTURE) ×5 IMPLANT
SUT RELOAD ENDO STITCH 2 48X1 (ENDOMECHANICALS) ×5
SUT RELOAD ENDO STITCH 2.0 (ENDOMECHANICALS) ×4
SUT SURGIDAC NAB ES-9 0 48 120 (SUTURE) IMPLANT
SUT VIC AB 2-0 SH 27 (SUTURE) ×2
SUT VIC AB 2-0 SH 27X BRD (SUTURE) ×1 IMPLANT
SUTURE RELOAD END STTCH 2 48X1 (ENDOMECHANICALS) ×5 IMPLANT
SUTURE RELOAD ENDO STITCH 2.0 (ENDOMECHANICALS) ×4 IMPLANT
SYR 10ML ECCENTRIC (SYRINGE) ×2 IMPLANT
SYR 20ML LL LF (SYRINGE) ×4 IMPLANT
SYR 50ML LL SCALE MARK (SYRINGE) ×2 IMPLANT
TOWEL OR 17X26 10 PK STRL BLUE (TOWEL DISPOSABLE) ×2 IMPLANT
TOWEL OR NON WOVEN STRL DISP B (DISPOSABLE) ×2 IMPLANT
TRAY FOLEY MTR SLVR 16FR STAT (SET/KITS/TRAYS/PACK) ×2 IMPLANT
TROCAR ADV FIXATION 12X100MM (TROCAR) ×2 IMPLANT
TROCAR ADV FIXATION 5X100MM (TROCAR) ×2 IMPLANT
TROCAR BLADELESS OPT 5 100 (ENDOMECHANICALS) ×2 IMPLANT
TROCAR XCEL 12X100 BLDLESS (ENDOMECHANICALS) ×2 IMPLANT
TUBING CONNECTING 10 (TUBING) ×4 IMPLANT

## 2020-12-14 NOTE — Anesthesia Procedure Notes (Signed)
Procedure Name: Intubation Date/Time: 12/14/2020 7:44 AM Performed by: Nelle Don, CRNA Pre-anesthesia Checklist: Patient identified, Emergency Drugs available, Suction available and Patient being monitored Oxygen Delivery Method: Circle system utilized Preoxygenation: Pre-oxygenation with 100% oxygen Induction Type: IV induction Ventilation: Oral airway inserted - appropriate to patient size and Mask ventilation with difficulty Laryngoscope Size: Glidescope and 4 Grade View: Grade I Tube type: Oral Tube size: 7.5 mm Number of attempts: 1 Airway Equipment and Method: Stylet and Video-laryngoscopy Placement Confirmation: ETT inserted through vocal cords under direct vision, positive ETCO2 and breath sounds checked- equal and bilateral Secured at: 23 cm Tube secured with: Tape Dental Injury: Teeth and Oropharynx as per pre-operative assessment  Comments: Elective glidescope given airway exam. Oral airway in place, difficult two hand mask, minimal tidal volumes.

## 2020-12-14 NOTE — Progress Notes (Signed)
PHARMACY CONSULT FOR:  Risk Assessment for Post-Discharge VTE Following Bariatric Surgery  Post-Discharge VTE Risk Assessment: This patient's probability of 30-day post-discharge VTE is increased due to the factors marked: X Male   Age >/=60 years  X BMI >/=50 kg/m2    CHF    Dyspnea at Rest    Paraplegia  X  Non-gastric-band surgery    Operation Time >/=3 hr    Return to OR     Length of Stay >/= 3 d   Hx of VTE   Hypercoagulable condition   Significant venous stasis    Predicted probability of 30-day post-discharge VTE: 0.51%  Recommendation for Discharge: Enoxaparin 60 mg  q12h x 2 weeks post-discharge  Michael Hutchinson is a 45 y.o. male who underwent laparoscopic Roux-en-Y gastric bypass 12/14/2020   Allergies  Allergen Reactions   Influenza Vac Split Quad Nausea And Vomiting   Influenza Virus Vaccine Split Nausea And Vomiting   Hydrochlorothiazide Other (See Comments)    Dizziness   Lisinopril Cough    Patient Measurements: Height: 6' (182.9 cm) Weight: (!) 178.6 kg (393 lb 12.8 oz) IBW/kg (Calculated) : 77.6 Body mass index is 53.41 kg/m.  No results for input(s): WBC, HGB, HCT, PLT, APTT, CREATININE, LABCREA, CREATININE, CREAT24HRUR, MG, PHOS, ALBUMIN, PROT, ALBUMIN, AST, ALT, ALKPHOS, BILITOT, BILIDIR, IBILI in the last 72 hours. Estimated Creatinine Clearance: 154.3 mL/min (by C-G formula based on SCr of 1.03 mg/dL).    Past Medical History:  Diagnosis Date   Asthma    Charcot's joint of foot 93/23/5573   Complication of anesthesia    "I wake up angry" (12/31/2015)   Diabetes mellitus type 1 ([place]) dx'd 1981   Diabetic ketoacidosis ([place])    Essential hypertension 05/03/2013   GERD (gastroesophageal reflux disease)    High cholesterol    Hx MRSA infection    Inner thigh and under arm- healed areas   Meniscus tear    Pneumonia    2016 ish     Medications Prior to Admission  Medication Sig Dispense Refill Last Dose   albuterol (VENTOLIN HFA)  108 (90 Base) MCG/ACT inhaler Inhale 1-2 puffs into the lungs every 4 (four) hours as needed for wheezing or shortness of breath. 8 g 2 Past Week   amLODipine (NORVASC) 5 MG tablet Take 1 tablet (5 mg total) by mouth daily. **NEEDS APT FOR REFILLS** 30 tablet 0 12/13/2020   Fluticasone Furoate (ARNUITY ELLIPTA) 100 MCG/ACT AEPB Inhale 1 puff into the lungs daily. 30 each 5 12/13/2020   HYDROcodone-acetaminophen (NORCO/VICODIN) 5-325 MG tablet Take 1 tablet by mouth every 4 (four) hours as needed for pain.   12/13/2020   insulin lispro (HUMALOG) 100 UNIT/ML injection Inject 60-70 Units into the skin daily. Use via insulin pump   12/14/2020   losartan (COZAAR) 50 MG tablet **NEEDS APT**TAKE 1 TABLET BY MOUTH DAILY. 30 tablet 0 12/13/2020   metoprolol succinate (TOPROL-XL) 100 MG 24 hr tablet Take 1 tablet (100 mg total) by mouth daily. TAKE WITH OR IMMEDIATELY FOLLOWING A MEAL. 90 tablet 0 12/14/2020 at 0550   Oxymetazoline HCl (MUCINEX NASAL SPRAY FULL FORCE NA) Place 1 spray into the nose daily as needed (congestion).   Past Month   Vitamin D, Ergocalciferol, (DRISDOL) 1.25 MG (50000 UNIT) CAPS capsule Take 1 capsule (50,000 Units total) by mouth every 7 (seven) days. 8 capsule 6 Past Month   aspirin-sod bicarb-citric acid (ALKA-SELTZER) 325 MG TBEF tablet Take 650 mg by mouth every 6 (six) hours  as needed (indigestion).   More than a month   BAYER MICROLET LANCETS lancets Use as instructed to check 3 times daily. 100 each 12    bismuth subsalicylate (PEPTO BISMOL) 262 MG/15ML suspension Take 30 mLs by mouth every 6 (six) hours as needed for diarrhea or loose stools or indigestion.   More than a month   Blood Glucose Monitoring Suppl (ONE TOUCH ULTRA SYSTEM KIT) w/Device KIT 1 kit by Does not apply route once. Check blood sugar 3 times daily. Diagnosis Diabetes ICD-10 E11.8      diazepam (VALIUM) 5 MG tablet Take 1 tablet 40 to 60 minutes prior to MRI 1 tablet 0 More than a month   glucose blood (BAYER  CONTOUR NEXT TEST) test strip 1 each by Other route 4 (four) times daily. And lancets 4/day 400 each 3    ibuprofen (ADVIL) 200 MG tablet Take 400 mg by mouth every 6 (six) hours as needed.   More than a month   Insulin Syringes, Disposable, U-100 1 ML MISC 3x daily 50 each 2    metFORMIN (GLUCOPHAGE-XR) 500 MG 24 hr tablet Take 500 mg by mouth daily with breakfast.   More than a month   Ocrelizumab (OCREVUS IV) Inject 1 Dose into the vein every 6 (six) months.   More than a month    Eudelia Bunch, Pharm.D 12/14/2020 12:16 PM

## 2020-12-14 NOTE — TOC Benefit Eligibility Note (Signed)
Transition of Care [facility]) Benefit Eligibility Note    Patient Details  Name: Michael Hutchinson MRN: [id number] Date of Birth: May 23, 1975   Medication/Dose: Enoxaparin 60 mg SQ q 12hr x 2 weeks  Covered?: Yes  Tier: Other  Prescription Coverage Preferred Pharmacy: local  Spoke with Person/Company/Phone Number:: Francee Piccolo [phone]  Co-Pay: $0  Prior Approval: No  Deductible: Met       Kerin Salen Phone Number: 12/14/2020, 2:08 PM

## 2020-12-14 NOTE — Transfer of Care (Signed)
Immediate Anesthesia Transfer of Care Note  Patient: Michael Hutchinson  Procedure(s) Performed: LAPAROSCOPIC ROUX-EN-Y GASTRIC BYPASS WITH UPPER ENDOSCOPY (Abdomen)  Patient Location: PACU  Anesthesia Type:General  Level of Consciousness: awake, alert  and oriented  Airway & Oxygen Therapy: Patient Spontanous Breathing and Patient connected to face mask oxygen  Post-op Assessment: Report given to RN, Post -op Vital signs reviewed and stable and Patient moving all extremities X 4  Post vital signs: Reviewed and stable  Last Vitals:  Vitals Value Taken Time  BP 126/84 12/14/20 1119  Temp    Pulse 101 12/14/20 1122  Resp 19 12/14/20 1122  SpO2 94 % 12/14/20 1122  Vitals shown include unvalidated device data.  Last Pain:  Vitals:   12/14/20 0627  TempSrc: Oral  PainSc:          Complications: No notable events documented.

## 2020-12-14 NOTE — TOC Initial Note (Signed)
Transition of Care [facility]) - Initial/Assessment Note   Patient Details  Name: Michael Hutchinson MRN: [id number] Date of Birth: Feb 28, 1975  Transition of [facility]) CM/SW Contact:    Ewing Schlein, LCSW Phone Number: 12/14/2020, 2:26 PM  Clinical Narrative: [facility] consulted for a benefits check for enoxaparin 60 mg SQ q 12hr x 2 weeks. Patient will have a $0 copay. CSW notified patient.  Expected Discharge Plan: Home/Self Care Barriers to Discharge: Continued Medical Work up  Patient Goals and CMS Choice Choice offered to / list presented to : NA  Expected Discharge Plan and Services Expected Discharge Plan: Home/Self Care In-house Referral: Clinical Social Work Post Acute Care Choice: NA Living arrangements for the past 2 months: Single Family Home          DME Arranged: N/A DME Agency: NA  Prior Living Arrangements/Services Living arrangements for the past 2 months: Single Family Home Lives with:: Spouse Patient language and need for interpreter reviewed:: Yes Need for Family Participation in Patient Care: No (Comment) Care giver support system in place?: Yes (comment) Criminal Activity/Legal Involvement Pertinent to Current Situation/Hospitalization: No - Comment as needed  Activities of Daily Living Home Assistive Devices/Equipment: Prosthesis, Insulin Pump, Walker (specify type), Wheelchair, Shower chair with back, Bedside commode/3-in-1, CBG Meter ADL Screening (condition at time of admission) Patient's cognitive ability adequate to safely complete daily activities?: Yes Is the patient deaf or have difficulty hearing?: No Does the patient have difficulty seeing, even when wearing glasses/contacts?: No Does the patient have difficulty concentrating, remembering, or making decisions?: No Patient able to express need for assistance with ADLs?: Yes Does the patient have difficulty dressing or bathing?: No Independently performs ADLs?: No Does the patient have difficulty walking or  climbing stairs?: No Weakness of Legs: Left Weakness of Arms/Hands: Right  Emotional Assessment Attitude/Demeanor/Rapport: Engaged Affect (typically observed): Accepting Orientation: : Oriented to Self, Oriented to Place, Oriented to  Time, Oriented to Situation Alcohol / Substance Use: Not Applicable  Admission diagnosis:  History of Roux-en-Y gastric bypass [Z98.84] Patient Active Problem List   Diagnosis Date Noted   History of Roux-en-Y gastric bypass 12/14/2020   Carpal tunnel syndrome of right wrist 09/09/2019   Cubital tunnel syndrome 09/09/2019   Pronator syndrome 09/09/2019   Cervical spine pain 08/07/2019   Amputated below knee (HCC) 06/28/2019   Cellulitis 06/28/2019   Sacral back pain 12/26/2018   Lumbar spondylosis 12/26/2018   Chronic pain syndrome 11/07/2018   Adhesive capsulitis of right shoulder 07/27/2018   Tendinosis of right shoulder 04/09/2018   Pain in joint of right shoulder 02/14/2018   Burning in the chest 09/20/2017   Statin intolerance 08/08/2017   Multiple sclerosis (HCC) 07/11/2017   Low hemoglobin 07/11/2017   Lateral epicondylitis of right elbow 03/20/2017   Pain in joint of right elbow 03/20/2017   Chronic fatigue 07/04/2016   Impaired exercise tolerance 07/04/2016   Acute left otitis media 06/09/2016   Hx of BKA, left - with subsequent cellulitis and treatment for osteomylitis 06/01/2016   Abscess of left leg 04/26/2016   Normocytic anemia 01/01/2016   Osteomyelitis (HCC) 12/31/2015   Diabetic ulcer of left heel associated with type 1 diabetes mellitus, with necrosis of bone (HCC) 12/31/2015   Ankle wound 12/31/2015   Chronic osteomyelitis of left ankle with draining sinus (HCC)    Type 1 diabetes, uncontrolled, with Charcot's joint of foot 10/12/2015   Low HDL (under 40) 09/22/2015   Obesity, Class III, BMI 40-49.9 (morbid obesity) (HCC)  09/22/2015   H/O noncompliance with medical treatment, presenting hazards to health 09/22/2015   Hx  MRSA infection 09/22/2015   Vitamin D deficiency 09/22/2015   Right foot injury, sequela 09/26/2014   Charcot's joint of foot 11/25/2013   Hypertension associated with diabetes (HCC) 05/03/2013   Type I diabetes mellitus (HCC) 05/03/2013   Body mass index (BMI) of 40.0-44.9 in adult [facility]) 05/03/2013   Mixed diabetic hyperlipidemia associated with type 1 diabetes mellitus (HCC) 07/26/2012   Hypercholesterolemia 07/26/2012   Diabetic ketoacidosis (HCC)    GERD (gastroesophageal reflux disease)    Meniscus tear    Epigastric pain 12/10/2011   Vomiting 12/10/2011   Problems influencing health status 11/21/2011   PCP:  Mayer Masker, PA-C Pharmacy:   [facility] - [place], [place] - [address] [place] [address] Phone: [phone] Fax: [phone]  [facility] [phone] - [place], [place] - [address] [place] [address] Phone: [phone] Fax: [phone]  Readmission Risk Interventions No flowsheet data found.

## 2020-12-14 NOTE — Op Note (Signed)
Preoperative diagnosis: Roux-en-Y gastric bypass  Postoperative diagnosis: Same   Procedure: Upper endoscopy   Surgeon: Berna Bue, M.D.  Anesthesia: Gen.   Description of procedure: The endoscope was placed in the mouth and oropharynx and under endoscopic vision it was advanced to the esophagogastric junction which was identified at 42cm from the teeth.  The pouch was tensely insufflated while the upper abdomen was flooded with irrigation to perform a leak test, which was negative. No bubbles were seen.  The staple line was hemostatic and the anastomotis is widely patent at 46cm from the teeth. The lumen was decompressed and the scope was withdrawn without difficulty.    Berna Bue, M.D. General, Bariatric, & Minimally Invasive Surgery [facility]

## 2020-12-14 NOTE — Op Note (Signed)
14 December 2020 Surgeon: Susy Frizzle B. Daphine Deutscher, MD, FACS Asst:  Phylliss Blakes, MD, FACS Anesthesia: General endotracheal Drains: None  Procedure: Laparoscopic Roux en Y gastric bypass with 40 cm BP limb and 100 cm Roux limb, antecolic, antegastric, candy cane to the left.  Closure of Peterson's defect. Upper endoscopy.   Description of Procedure:  The patient was taken to OR 1 at [facility] and given general anesthesia.  The abdomen was prepped with chloroprep including the glucose monitor and draped sterilely.  A time out was performed.    The operation began by identifying the ligament of Treitz. I measured 40 cm downstream and divided the bowel with a 6 cm Covidian stapler.  I sutured a Penrose drain along the Roux limb end.  I measured a 1 meter (100 cm) Roux limb and then placed the distal bowels to the BP limb side by side and performed a stapled jejunojejunostomy. The common defect was closed from either end with 4-0 Vicryl using the Endo Stitch. The mesenteric defect was closed with a running 2-0 silk using the Endo Stitch. Tisseel was applied to the suture line.  The omentum was divided with the harmonic scalpel.  The Nathanson retractor was inserted in the left lateral segment of liver was retracted. The foregut dissection ensued.  6 cm pouch was described by measuring along the lessor curvature.  A staple pouch was created with the Covidien stapler using the purple load including TRS for three to completion of pouch.    The Roux limb was then brought up with the candycane pointed left and a back row of sutures of 2-0 Vicryl were placed. I opened along the right side of each structure and inserted the 4.5 cm stapler to create the gastrojejunostomy. The common defect was closed from either end with 2-0 Vicryl and a second row was placed anterior to that the Ewald tube acting as a stent across the anastomosis. The Penrose drain was removed. Peterson's defect was closed with 2-0 silk.   Endoscopy was  performed by Dr Fredricka Bonine and the 6 cm pouch with a patent anastomosis.  The incisions were injected with Exparel as a TAP and were closed with 4-0 Monocryl and Dermabond.  The patient was taken to the recovery room in satisfactory condition.  Matt B. Daphine Deutscher, MD, FACS

## 2020-12-14 NOTE — Interval H&P Note (Signed)
History and Physical Interval Note:  12/14/2020 7:22 AM  Michael Hutchinson  has presented today for surgery, with the diagnosis of MORBID OBESITY.  The various methods of treatment have been discussed with the patient and family. After consideration of risks, benefits and other options for treatment, the patient has consented to  Procedure(s): LAPAROSCOPIC ROUX-EN-Y GASTRIC BYPASS WITH UPPER ENDOSCOPY (N/A) as a surgical intervention.  The patient's history has been reviewed, patient examined, no change in status, stable for surgery.  I have reviewed the patient's chart and labs.  Questions were answered to the patient's satisfaction.     [name]

## 2020-12-14 NOTE — Progress Notes (Signed)

## 2020-12-14 NOTE — Progress Notes (Signed)
Patient arrived to the floor and walked from stretcher to chair independently. Patient stated he started drinking water in the PACU. PT demonstrated successful use of IS reaching 2500. Pt complains of gas in his chest. MD ordered Simethicone, will continue to monitor.

## 2020-12-14 NOTE — Progress Notes (Signed)
Inpatient Diabetes Program Recommendations  AACE/ADA: New Consensus Statement on Inpatient Glycemic Control (2015)  Target Ranges:  Prepandial:   less than 140 mg/dL      Peak postprandial:   less than 180 mg/dL (1-2 hours)      Critically ill patients:  140 - 180 mg/dL   Lab Results  Component Value Date   GLUCAP 223 (H) 12/14/2020   HGBA1C 7.6 (H) 11/24/2020    Review of Glycemic Control  Diabetes history: DM1 since age 45 Outpatient Diabetes medications: Insulin pump (average approx 60-70 units/day), metformin 500 mg QAM (not taking) Current orders for Inpatient glycemic control: CBGs Q4H  HgbA1C - 7.5%  Total basal - 50.4 units/day CHO ratio: 1:6 CF - 40  Has temporary settings for no insulin < 150 mg/dL. Pt spoke with Endo about what he should do with pump after bariatric surgery, and he said he did not have an answer. MD said to keep pump on for basal and correction. Discussed hypoglycemia s/s and treatment and pt has alarms set on pump and has awareness of hypoglycemia.  Inpatient Diabetes Program Recommendations:    CBGs Q4H.  Pt has CGM on pump.  Sign insulin pump contract.   Discussed with Bariatric RN and bedside RN.  Continue to follow glucose trends.  Thank you. Ailene Ards, RD, LDN, CDE Inpatient Diabetes Coordinator [phone]

## 2020-12-14 NOTE — Anesthesia Preprocedure Evaluation (Signed)
Anesthesia Evaluation  Patient identified by MRN, date of birth, ID band Patient awake    Reviewed: Allergy & Precautions, H&P , NPO status , Patient's Chart, lab work & pertinent test results  Airway Mallampati: II  TM Distance: >3 FB Neck ROM: Full    Dental  (+) Poor Dentition, Chipped, Missing, Dental Advisory Given   Pulmonary neg pulmonary ROS,    Pulmonary exam normal breath sounds clear to auscultation       Cardiovascular hypertension, + Peripheral Vascular Disease  Normal cardiovascular exam Rhythm:Regular Rate:Normal     Neuro/Psych negative neurological ROS  negative psych ROS   GI/Hepatic negative GI ROS, Neg liver ROS,   Endo/Other  diabetes, Type 1, Insulin DependentMorbid obesity  Renal/GU negative Renal ROS  negative genitourinary   Musculoskeletal negative musculoskeletal ROS (+)   Abdominal   Peds negative pediatric ROS (+)  Hematology negative hematology ROS (+)   Anesthesia Other Findings   Reproductive/Obstetrics negative OB ROS                             Anesthesia Physical Anesthesia Plan  ASA: 3  Anesthesia Plan: General   Post-op Pain Management: Tylenol PO (pre-op)   Induction: Intravenous  PONV Risk Score and Plan: 2 and Ondansetron, Treatment may vary due to age or medical condition and Midazolam  Airway Management Planned: Oral ETT  Additional Equipment:   Intra-op Plan:   Post-operative Plan: Extubation in OR  Informed Consent: I have reviewed the patients History and Physical, chart, labs and discussed the procedure including the risks, benefits and alternatives for the proposed anesthesia with the patient or authorized representative who has indicated his/her understanding and acceptance.     Dental advisory given  Plan Discussed with: CRNA and Surgeon  Anesthesia Plan Comments:         Anesthesia Quick Evaluation

## 2020-12-15 ENCOUNTER — Encounter (HOSPITAL_COMMUNITY): Payer: Self-pay | Admitting: Surgery

## 2020-12-15 LAB — CBC WITH DIFFERENTIAL/PLATELET
Abs Immature Granulocytes: 0.03 10*3/uL (ref 0.00–0.07)
Basophils Absolute: 0 10*3/uL (ref 0.0–0.1)
Basophils Relative: 0 %
Eosinophils Absolute: 0 10*3/uL (ref 0.0–0.5)
Eosinophils Relative: 0 %
HCT: 38.5 % — ABNORMAL LOW (ref 39.0–52.0)
Hemoglobin: 12.9 g/dL — ABNORMAL LOW (ref 13.0–17.0)
Immature Granulocytes: 0 %
Lymphocytes Relative: 6 %
Lymphs Abs: 0.6 10*3/uL — ABNORMAL LOW (ref 0.7–4.0)
MCH: 29.9 pg (ref 26.0–34.0)
MCHC: 33.5 g/dL (ref 30.0–36.0)
MCV: 89.3 fL (ref 80.0–100.0)
Monocytes Absolute: 1 10*3/uL (ref 0.1–1.0)
Monocytes Relative: 10 %
Neutro Abs: 8.2 10*3/uL — ABNORMAL HIGH (ref 1.7–7.7)
Neutrophils Relative %: 84 %
Platelets: 236 10*3/uL (ref 150–400)
RBC: 4.31 MIL/uL (ref 4.22–5.81)
RDW: 13.4 % (ref 11.5–15.5)
WBC: 9.8 10*3/uL (ref 4.0–10.5)
nRBC: 0 % (ref 0.0–0.2)

## 2020-12-15 LAB — GLUCOSE, CAPILLARY
Glucose-Capillary: 285 mg/dL — ABNORMAL HIGH (ref 70–99)
Glucose-Capillary: 297 mg/dL — ABNORMAL HIGH (ref 70–99)
Glucose-Capillary: 221 mg/dL — ABNORMAL HIGH (ref 70–99)

## 2020-12-15 MED ORDER — PANTOPRAZOLE SODIUM 40 MG PO TBEC: 40.0000 mg | DELAYED_RELEASE_TABLET | Freq: Every day | ORAL | Status: DC

## 2020-12-15 MED ORDER — OXYCODONE HCL 5 MG PO TABS: 5.0000 mg | ORAL_TABLET | Freq: Four times a day (QID) | ORAL | 0 refills | Status: DC | PRN

## 2020-12-15 MED ORDER — PANTOPRAZOLE SODIUM 40 MG PO TBEC: 40.0000 mg | DELAYED_RELEASE_TABLET | Freq: Every day | ORAL | 0 refills | Status: DC

## 2020-12-15 MED ORDER — ONDANSETRON 4 MG PO TBDP: 4.0000 mg | ORAL_TABLET | Freq: Four times a day (QID) | ORAL | 0 refills | Status: DC | PRN

## 2020-12-15 MED ORDER — ENOXAPARIN (LOVENOX) PATIENT EDUCATION KIT
PACK | Freq: Once | Status: AC
  Filled 2020-12-15: qty 1

## 2020-12-15 MED ORDER — ENOXAPARIN SODIUM 60 MG/0.6ML IJ SOSY: 60.0000 mg | PREFILLED_SYRINGE | Freq: Two times a day (BID) | INTRAMUSCULAR | 0 refills | Status: DC

## 2020-12-15 NOTE — Progress Notes (Signed)
Patient alert and oriented. Reviewed Gastric Bypass discharge instructions with patient and wife and both able to articulate understanding. Provided information on BELT program, Support Group and [facility] outpatient pharmacy. All questions answered, will continue to monitor.

## 2020-12-15 NOTE — Progress Notes (Signed)
24hr fluid recall: 480mL. Per dehydration protocol, will call pt to f/u within one week post op. 

## 2020-12-15 NOTE — Discharge Summary (Signed)
Physician Discharge Summary  Patient ID: Michael Hutchinson MRN: [id number] DOB/AGE: 03-17-1975 45 y.o.  PCP: Lorrene Reid, PA-C  Admit date: 12/14/2020 Discharge date: 12/15/2020  Admission Diagnoses:  obesity and type I diabetes mellitus  Discharge Diagnoses:  same  Principal Problem:   History of Roux-en-Y gastric bypass   Surgery:  laparoscopic roux en Y gastric bypass  Discharged Condition: improved  Hospital Course:   Had surgery on Monday and started and continued liquids and was ready for discharge on Tuesday.  Doing well  Consults: diabetes coordinator for the pump  Significant Diagnostic Studies: none    Discharge Exam: Blood pressure (!) 157/77, pulse 95, temperature 98.3 F (36.8 C), temperature source Oral, resp. rate 16, height 6' (1.829 m), weight (!) 178.6 kg, SpO2 98 %. Incisions bland  Disposition: Discharge disposition: 01-Home or Self Care       Discharge Instructions     Ambulate hourly while awake   Complete by: As directed    Call MD for:  difficulty breathing, headache or visual disturbances   Complete by: As directed    Call MD for:  persistant dizziness or light-headedness   Complete by: As directed    Call MD for:  persistant nausea and vomiting   Complete by: As directed    Call MD for:  redness, tenderness, or signs of infection (pain, swelling, redness, odor or green/yellow discharge around incision site)   Complete by: As directed    Call MD for:  severe uncontrolled pain   Complete by: As directed    Call MD for:  temperature >101 F   Complete by: As directed    Diet bariatric full liquid   Complete by: As directed    Discharge wound care:   Complete by: As directed    May shower and shampoo upon discharge   Incentive spirometry   Complete by: As directed    Perform hourly while awake      Allergies as of 12/15/2020       Reactions   Influenza Vac Split Quad Nausea And Vomiting   Influenza Virus Vaccine Split Nausea  And Vomiting   Hydrochlorothiazide Other (See Comments)   Dizziness   Lisinopril Cough        Medication List     STOP taking these medications    aspirin-sod bicarb-citric acid 325 MG Tbef tablet Commonly known as: ALKA-SELTZER   bismuth subsalicylate 654 YT/03TW suspension Commonly known as: PEPTO BISMOL   diazepam 5 MG tablet Commonly known as: Valium   HYDROcodone-acetaminophen 5-325 MG tablet Commonly known as: NORCO/VICODIN   ibuprofen 200 MG tablet Commonly known as: ADVIL   OCREVUS IV       TAKE these medications    albuterol 108 (90 Base) MCG/ACT inhaler Commonly known as: VENTOLIN HFA Inhale 1-2 puffs into the lungs every 4 (four) hours as needed for wheezing or shortness of breath.   amLODipine 5 MG tablet Commonly known as: NORVASC Take 1 tablet (5 mg total) by mouth daily. **NEEDS APT FOR REFILLS** Notes to patient: Monitor Blood Pressure Daily and keep a log for primary care physician.  You may need to make changes to your medications with rapid weight loss.     Arnuity Ellipta 100 MCG/ACT Aepb Generic drug: Fluticasone Furoate Inhale 1 puff into the lungs daily.   Bayer Microlet Lancets lancets Use as instructed to check 3 times daily.   enoxaparin 60 MG/0.6ML injection Commonly known as: LOVENOX Inject 0.6 mLs (60 mg total)  into the skin 2 (two) times daily for 14 days.   glucose blood test strip Commonly known as: Visual merchandiser Next Test 1 each by Other route 4 (four) times daily. And lancets 4/day   insulin lispro 100 UNIT/ML injection Commonly known as: HUMALOG Inject 60-70 Units into the skin daily. Use via insulin pump Notes to patient: Monitor Blood Sugar Frequently and keep a log for primary care physician, you may need to adjust medication dosage with rapid weight loss.     Insulin Syringes (Disposable) U-100 1 ML Misc 3x daily   losartan 50 MG tablet Commonly known as: COZAAR **NEEDS APT**TAKE 1 TABLET BY MOUTH  DAILY. Notes to patient: Monitor Blood Pressure Daily and keep a log for primary care physician.  You may need to make changes to your medications with rapid weight loss.     metFORMIN 500 MG 24 hr tablet Commonly known as: GLUCOPHAGE-XR Take 500 mg by mouth daily with breakfast. Notes to patient: Monitor Blood Sugar Frequently and keep a log for primary care physician, you may need to adjust medication dosage with rapid weight loss.     metoprolol succinate 100 MG 24 hr tablet Commonly known as: TOPROL-XL Take 1 tablet (100 mg total) by mouth daily. TAKE WITH OR IMMEDIATELY FOLLOWING A MEAL. Notes to patient: Monitor Blood Pressure Daily and keep a log for primary care physician.  You may need to make changes to your medications with rapid weight loss.     MUCINEX NASAL SPRAY FULL FORCE NA Place 1 spray into the nose daily as needed (congestion).   ondansetron 4 MG disintegrating tablet Commonly known as: ZOFRAN-ODT Take 1 tablet (4 mg total) by mouth every 6 (six) hours as needed for nausea or vomiting.   ONE TOUCH ULTRA SYSTEM KIT w/Device Kit 1 kit by Does not apply route once. Check blood sugar 3 times daily. Diagnosis Diabetes ICD-10 E11.8   oxyCODONE 5 MG immediate release tablet Commonly known as: Oxy IR/ROXICODONE Take 1 tablet (5 mg total) by mouth every 6 (six) hours as needed for severe pain.   pantoprazole 40 MG tablet Commonly known as: PROTONIX Take 1 tablet (40 mg total) by mouth daily.   Vitamin D (Ergocalciferol) 1.25 MG (50000 UNIT) Caps capsule Commonly known as: DRISDOL Take 1 capsule (50,000 Units total) by mouth every 7 (seven) days.               Discharge Care Instructions  (From admission, onward)           Start     Ordered   12/15/20 0000  Discharge wound care:       Comments: May shower and shampoo upon discharge   12/15/20 1357            Follow-up Information     Gosai, Puja, PA-C. Go on 01/06/2021.   Specialty: General  Surgery Why: at 9:30am for Dr. Hassell Done.  Please arrive 15 minutes prior to your appointment.  Thank you. Contact information: [place]  [id number] [phone]         Surgery, [facility]. Go on 02/03/2021.   Specialty: General Surgery Why: at 9am with Dr. Hassell Done.  Please arrive 15 minutes prior to your appointment time.   Thank you. Contact information: [address] [place] [address] [phone]                 Signed: Pedro Earls 12/15/2020, 1:57 PM

## 2020-12-15 NOTE — Discharge Instructions (Signed)

## 2020-12-15 NOTE — Progress Notes (Signed)
Inpatient Diabetes Program Recommendations  AACE/ADA: New Consensus Statement on Inpatient Glycemic Control (2015)  Target Ranges:  Prepandial:   less than 140 mg/dL      Peak postprandial:   less than 180 mg/dL (1-2 hours)      Critically ill patients:  140 - 180 mg/dL   Lab Results  Component Value Date   GLUCAP 221 (H) 12/15/2020   HGBA1C 7.6 (H) 11/24/2020    Review of Glycemic Control  CBGs today: 297, 221 mg/dL To be discharged this afternoon.  Inpatient Diabetes Program Recommendations:    Continue with insulin pump per Endo. Blood sugars should improve with pain control and going home.   To call Endo if blood sugars continue > 200 mg/dL.   Answered questions.  Thank you. Ailene Ards, RD, LDN, CDE Inpatient Diabetes Coordinator [phone]

## 2020-12-15 NOTE — Plan of Care (Signed)
  Problem: Education: Goal: Ability to state signs and symptoms to report to health care provider will improve Outcome: Adequate for Discharge Goal: Knowledge of the prescribed self-care regimen will improve Outcome: Adequate for Discharge Goal: Knowledge of discharge needs will improve Outcome: Adequate for Discharge   Problem: Activity: Goal: Ability to tolerate increased activity will improve Outcome: Adequate for Discharge   Problem: Bowel/Gastric: Goal: Gastrointestinal status for postoperative course will improve Outcome: Adequate for Discharge Goal: Occurrences of nausea will decrease Outcome: Adequate for Discharge   Problem: Coping: Goal: Development of coping mechanisms to deal with changes in body function or appearance will improve Outcome: Adequate for Discharge   Problem: Fluid Volume: Goal: Maintenance of adequate hydration will improve Outcome: Adequate for Discharge   Problem: Nutritional: Goal: Nutritional status will improve Outcome: Adequate for Discharge   Problem: Clinical Measurements: Goal: Will show no signs or symptoms of venous thromboembolism Outcome: Adequate for Discharge Goal: Will remain free from infection Outcome: Adequate for Discharge Goal: Will show no signs of GI Leak Outcome: Adequate for Discharge   Problem: Respiratory: Goal: Will regain and/or maintain adequate ventilation Outcome: Adequate for Discharge   Problem: Pain Management: Goal: Pain level will decrease Outcome: Adequate for Discharge   Problem: Skin Integrity: Goal: Demonstration of wound healing without infection will improve Outcome: Adequate for Discharge   

## 2020-12-15 NOTE — Progress Notes (Signed)
Patient alert and oriented, Post op day 1.  Provided support and encouragement.  Encouraged pulmonary toilet, ambulation and small sips of liquids.  Reviewed Lovenox teaching kit and pt reviewed Lovenox "Patient-Self Injection Video".  All questions answered.  Will continue to monitor.

## 2020-12-15 NOTE — Anesthesia Postprocedure Evaluation (Signed)
Anesthesia Post Note  Patient: Michael Hutchinson  Procedure(Hutchinson) Performed: LAPAROSCOPIC ROUX-EN-Y GASTRIC BYPASS WITH UPPER ENDOSCOPY (Abdomen)     Patient location during evaluation: PACU Anesthesia Type: General Level of consciousness: awake and alert Pain management: pain level controlled Vital Signs Assessment: post-procedure vital signs reviewed and stable Respiratory status: spontaneous breathing, nonlabored ventilation, respiratory function stable and patient connected to nasal cannula oxygen Cardiovascular status: blood pressure returned to baseline and stable Postop Assessment: no apparent nausea or vomiting Anesthetic complications: no   No notable events documented.  Last Vitals:  Vitals:   12/15/20 0327 12/15/20 0520  BP: 138/80 (!) 142/82  Pulse: (!) 110 (!) 109  Resp: 18 15  Temp: 37.4 C 37.4 C  SpO2: 95% 97%    Last Pain:  Vitals:   12/15/20 0607  TempSrc:   PainSc: 4                  Michael Hutchinson

## 2020-12-18 ENCOUNTER — Telehealth (HOSPITAL_COMMUNITY): Payer: Self-pay | Admitting: *Deleted

## 2020-12-18 NOTE — Telephone Encounter (Addendum)
1.  Tell me about your pain and pain management? Pt c/o of some intermittent mid abdominal incisional pain.  Pt states that it's tolerable and has discussed it with the MD. Pt instructed to call [facility] if pain worsens.  2.  Let's talk about fluid intake.  How much total fluid are you taking in? Pt states that he is getting in more than 70oz of fluid including protein shakes and bottled water.  Pt instructed to assess status and suggestions daily utilizing Hydration Action Plan on discharge folder and to call [facility] if in the "red zone".   3.  How much protein have you taken in the last 2 days? Pt states that he is working to meet goal of goal of 80g of protein today.  Pt has already consumed one protein shake.  Pt plans to drink remainder of protein throughout the rest of the day to meet goal. Pt has purchased protein to add to his water.  4.  Have you had nausea?  Tell me about when have experienced nausea and what you did to help? Pt denies nausea.   5.  Has the frequency or color changed with your urine? Pt states that he is urinating a lot, but stated that he was experiencing some discomfort when urinating after discharge, but states that he no longer feels any discomfort.  Denies any odor, frequency or urgency.  Pt instructed to contact MD or PCP is symptoms reoccur or worsen.   6.  Tell me what your incisions look like? "Incisions look fine". Pt denies a fever, chills.  Pt states incisions are not swollen, open, or draining.  Pt encouraged to call [facility] if incisions change.   7.  Have you been passing gas? BM? Pt states that he has not had a BM.  Pt instructed to take either Miralax or MoM as instructed per "Gastric Bypass/Sleeve Discharge Home Care Instructions".  Pt to call surgeon's office if not able to have BM with medication.   8.  If a problem or question were to arise who would you call?  Do you know contact numbers for [facility], [facility], and [facility]? Pt denies dehydration symptoms.  Pt can describe  s/sx of dehydration.  Pt knows to call [facility] for surgical, [facility] for nutrition, and [facility] for non-urgent questions or concerns.   9.  How has the walking going? Pt states he is walking around and able to be active without difficulty.   10. Are you still using your incentive spirometer?  If so, how often? Pt states that he has not done the I.S. today. Pt encouraged to use incentive spirometer, at least 10x every hour while awake until he sees the surgeon.  11.  How are your vitamins and calcium going?  How are you taking them? Pt states that he purchased his TUMS for calcium today and has started taking them.  Pt has not purchased the bariatric MVI, but plans to do so soon.  Pt instructed to started MVI as soon as possible.  Reinforced education to take them at least 2 hours apart.  Pt states that he is taking his Lovenox injections without any difficulty.  Reminded patient that the first 30 days post-operatively are important for successful recovery.  Practice good hand hygiene, wearing a mask when appropriate (since optional in most places), and minimizing exposure to people who live outside of the home, especially if they are exhibiting any respiratory, GI, or illness-like symptoms.

## 2020-12-22 ENCOUNTER — Encounter: Payer: Self-pay | Admitting: Physician Assistant

## 2020-12-22 ENCOUNTER — Other Ambulatory Visit: Payer: Self-pay

## 2020-12-22 ENCOUNTER — Ambulatory Visit (INDEPENDENT_AMBULATORY_CARE_PROVIDER_SITE_OTHER): Payer: BC Managed Care – PPO | Admitting: Physician Assistant

## 2020-12-22 VITALS — BP 140/79 | HR 94 | Temp 98.0°F | Ht 72.0 in | Wt 381.0 lb

## 2020-12-22 DIAGNOSIS — E782 Mixed hyperlipidemia: Secondary | ICD-10-CM

## 2020-12-22 DIAGNOSIS — E1159 Type 2 diabetes mellitus with other circulatory complications: Secondary | ICD-10-CM | POA: Diagnosis not present

## 2020-12-22 DIAGNOSIS — I152 Hypertension secondary to endocrine disorders: Secondary | ICD-10-CM

## 2020-12-22 DIAGNOSIS — E1065 Type 1 diabetes mellitus with hyperglycemia: Secondary | ICD-10-CM

## 2020-12-22 DIAGNOSIS — Z9884 Bariatric surgery status: Secondary | ICD-10-CM

## 2020-12-22 DIAGNOSIS — E1069 Type 1 diabetes mellitus with other specified complication: Secondary | ICD-10-CM

## 2020-12-22 DIAGNOSIS — R0602 Shortness of breath: Secondary | ICD-10-CM

## 2020-12-22 DIAGNOSIS — E559 Vitamin D deficiency, unspecified: Secondary | ICD-10-CM

## 2020-12-22 MED ORDER — VITAMIN D (ERGOCALCIFEROL) 1.25 MG (50000 UNIT) PO CAPS: 50000.0000 [IU] | ORAL_CAPSULE | ORAL | 0 refills | Status: DC

## 2020-12-22 NOTE — Patient Instructions (Signed)
Vitamin D Deficiency Vitamin D deficiency is when your body does not have enough vitamin D. Vitamin D is important to your body because: It helps your body use other minerals. It helps to keep your bones strong and healthy. It may help to prevent some diseases. It helps your heart and other muscles work well. Not getting enough vitamin D can make your bones soft. It can also cause other health problems. What are the causes? This condition may be caused by: Not eating enough foods that contain vitamin D. Not getting enough sun. Having diseases that make it hard for your body to absorb vitamin D. Having a surgery in which a part of the stomach or a part of the small intestine is removed. Having kidney disease or liver disease. What increases the risk? You are more likely to get this condition if: You are older. You do not spend much time outdoors. You live in a nursing home. You have had broken bones. You have weak or thin bones (osteoporosis). You have a disease or condition that changes how your body absorbs vitamin D. You have dark skin. You take certain medicines. You are overweight or obese. What are the signs or symptoms? In mild cases, there may not be any symptoms. If the condition is very bad, symptoms may include: Bone pain. Muscle pain. Falling often. Broken bones caused by a minor injury. How is this treated? Treatment may include taking supplements as told by your doctor. Your doctor will tell you what dose is best for you. Supplements may include: Vitamin D. Calcium. Follow these instructions at home: Eating and drinking  Eat foods that contain vitamin D, such as: Dairy products, cereals, or juices with added vitamin D. Check the label. Fish, such as salmon or trout. Eggs. Oysters. Mushrooms. The items listed above may not be a complete list of what you can eat and drink. Contact a dietitian for more options. General instructions Take medicines and  supplements only as told by your doctor. Get regular, safe exposure to natural sunlight. Do not use a tanning bed. Maintain a healthy weight. Lose weight if needed. Keep all follow-up visits as told by your doctor. This is important. How is this prevented? You can get vitamin D by: Eating foods that naturally contain vitamin D. Eating or drinking products that have vitamin D added to them, such as cereals, juices, and milk. Taking vitamin D or a multivitamin that contains vitamin D. Being in the sun. Your body makes vitamin D when your skin is exposed to sunlight. Your body changes the sunlight into a form of the vitamin that it can use. Contact a doctor if: Your symptoms do not go away. You feel sick to your stomach (nauseous). You throw up (vomit). You poop less often than normal, or you have trouble pooping (constipation). Summary Vitamin D deficiency is when your body does not have enough vitamin D. Vitamin D helps to keep your bones strong and healthy. This condition is often treated by taking a supplement. Your doctor will tell you what dose is best for you. This information is not intended to replace advice given to you by your health care provider. Make sure you discuss any questions you have with your health care provider. Document Revised: 09/04/2017 Document Reviewed: 09/04/2017 Elsevier Patient Education  2022 [employer]  

## 2020-12-22 NOTE — Assessment & Plan Note (Signed)
-  Followed by  Endocrinology.  -11/24/2020 A1c 7.6. -Patient reports no longer taking Metformin. On insulin lispro and Medtronic 670G pump. -Continue with ambulatory glucose monitoring. -Follow up with endocrinology as scheduled.

## 2020-12-22 NOTE — Progress Notes (Signed)
Established Patient Office Visit  Subjective:  Patient ID: Michael Hutchinson, male    DOB: 1975-11-28  Age: 45 y.o. MRN: [id number]  CC:  Chief Complaint  Patient presents with   Follow-up   Diabetes   Hyperlipidemia   Hypertension    HPI [name] presents for follow up on hypertension, hyperlipidemia and type 1 diabetes mellitus. Patient is s/p gastric bypass surgery. C/o of abdominal pain especially with deep breathing and some shortness of breath since surgery. Denies chest pain, palpitations, dizziness, fever, cough or altered mental status.    HTN: Pt denies chest pain, palpitations, dizziness or lower extremity swelling. Taking medication as directed without side effects.   HLD: Pt did not tolerate Lipitor and Crestor in the past due to myalgias/cramps. Patient working on weight loss and dietary changes.   Diabetes mellitus type 1: Followed by endocrinology. Reports his sugars have been running around 150-180. States is in the process of waiting for his new insulin pump.    Past Medical History:  Diagnosis Date   Asthma    Charcot's joint of foot 09/38/1829   Complication of anesthesia    "I wake up angry" (12/31/2015)   Diabetes mellitus type 1 ([facility]) dx'd 1981   Diabetic ketoacidosis ([place])    Essential hypertension 05/03/2013   GERD (gastroesophageal reflux disease)    High cholesterol    Hx MRSA infection    Inner thigh and under arm- healed areas   Meniscus tear    Pneumonia    2016 ish    Past Surgical History:  Procedure Laterality Date   AMPUTATION Left 01/01/2016   Procedure: AMPUTATION BELOW KNEE;  Surgeon: [name], MD;  Location: [facility];  Service: Orthopedics;  Laterality: Left;   APPLICATION OF WOUND VAC  04/26/2016   CARPAL TUNNEL WITH CUBITAL TUNNEL Right 10/22/2019   Procedure: Right carpal tunnel release, right cubital tunnel release in situ, Right pronator/proximal median nerve release, Right elbow ECRB debridement with partial ostectomy  and repair as necessary;  Surgeon: [name], MD;  Location: [facility];  Service: Orthopedics;  Laterality: Right;  185min   GASTRIC ROUX-EN-Y N/A 12/14/2020   Procedure: LAPAROSCOPIC ROUX-EN-Y GASTRIC BYPASS WITH UPPER ENDOSCOPY;  Surgeon: [name], MD;  Location: [facility];  Service: General;  Laterality: N/A;   HERNIA REPAIR     I & D EXTREMITY Left 04/26/2016   Procedure: IRRIGATION AND DEBRIDEMENT EXTREMITY/Left Leg/Possible Wound Vac;  Surgeon: [name], MD;  Location: [facility];  Service: Orthopedics;  Laterality: Left;   INCISE AND DRAIN ABCESS Left 04/26/2016   KNEE ARTHROSCOPY Left ~ 2010   LAPAROSCOPIC CHOLECYSTECTOMY  2015   METACARPOPHALANGEAL JOINT ARTHRODESIS Left 06/2012   Fracture left index finger intra-articular MCP joint/notes 06/30/2012   OPEN REDUCTION INTERNAL FIXATION (ORIF) PROXIMAL PHALANX Left 06/30/2012   Procedure: OPEN REDUCTION INTERNAL FIXATION (ORIF) LEFT INDEX FINGER PROXIMAL PHALANX FRACTURE WITH LIGAMENT REPAIR AS NECESSARY;  Surgeon: [name], MD;  Location: [facility];  Service: Orthopedics;  Laterality: Left;   UMBILICAL HERNIA REPAIR  2015   "w/gallbladder OR"    Family History  Problem Relation Age of Onset   Other Mother    Cancer Mother        Breast / Bone   Heart attack Father    Hypertension Father    Hyperlipidemia Father    Diabetes Other    Alcohol abuse Sister    Diabetes Maternal Grandfather    Stroke Paternal Grandmother    Alcohol  abuse Paternal Grandfather     Social History   Socioeconomic History   Marital status: Married    Spouse name: Marchelle Folks   Number of children: 2   Years of education: Not on file   Highest education level: High school graduate  Occupational History   Occupation: Curator  Tobacco Use   Smoking status: Never   Smokeless tobacco: Former    Types: Snuff, Chew    Quit date: 08/25/2010  Vaping Use   Vaping Use: Never used  Substance and Sexual Activity   Alcohol use: Yes    Alcohol/week: 12.0  standard drinks    Types: 12 Cans of beer per week    Comment: mixed drink every 1-2 weeks   Drug use: No   Sexual activity: Yes  Other Topics Concern   Not on file  Social History Narrative   Pt is a right handed married man, lives with his wife and 2 children ages 56 and 12, in a single story house. Pt drinks 6-8, 16oz diet sodas a day.    Social Determinants of Health   Financial Resource Strain: Not on file  Food Insecurity: Not on file  Transportation Needs: Not on file  Physical Activity: Not on file  Stress: Not on file  Social Connections: Not on file  Intimate Partner Violence: Not on file    Outpatient Medications Prior to Visit  Medication Sig Dispense Refill   albuterol (VENTOLIN HFA) 108 (90 Base) MCG/ACT inhaler Inhale 1-2 puffs into the lungs every 4 (four) hours as needed for wheezing or shortness of breath. 8 g 2   amLODipine (NORVASC) 5 MG tablet Take 1 tablet (5 mg total) by mouth daily. **NEEDS APT FOR REFILLS** 30 tablet 0   BAYER MICROLET LANCETS lancets Use as instructed to check 3 times daily. 100 each 12   Blood Glucose Monitoring Suppl (ONE TOUCH ULTRA SYSTEM KIT) w/Device KIT 1 kit by Does not apply route once. Check blood sugar 3 times daily. Diagnosis Diabetes ICD-10 E11.8     enoxaparin (LOVENOX) 60 MG/0.6ML injection Inject 0.6 mLs (60 mg total) into the skin 2 (two) times daily for 14 days. 16.8 mL 0   Fluticasone Furoate (ARNUITY ELLIPTA) 100 MCG/ACT AEPB Inhale 1 puff into the lungs daily. 30 each 5   glucose blood (BAYER CONTOUR NEXT TEST) test strip 1 each by Other route 4 (four) times daily. And lancets 4/day 400 each 3   insulin lispro (HUMALOG) 100 UNIT/ML injection Inject 60-70 Units into the skin daily. Use via insulin pump     Insulin Syringes, Disposable, U-100 1 ML MISC 3x daily 50 each 2   losartan (COZAAR) 50 MG tablet **NEEDS APT**TAKE 1 TABLET BY MOUTH DAILY. 30 tablet 0   metoprolol succinate (TOPROL-XL) 100 MG 24 hr tablet Take 1  tablet (100 mg total) by mouth daily. TAKE WITH OR IMMEDIATELY FOLLOWING A MEAL. 90 tablet 0   ondansetron (ZOFRAN-ODT) 4 MG disintegrating tablet Take 1 tablet (4 mg total) by mouth every 6 (six) hours as needed for nausea or vomiting. 20 tablet 0   oxyCODONE (OXY IR/ROXICODONE) 5 MG immediate release tablet Take 1 tablet (5 mg total) by mouth every 6 (six) hours as needed for severe pain. 10 tablet 0   metFORMIN (GLUCOPHAGE-XR) 500 MG 24 hr tablet Take 500 mg by mouth daily with breakfast.     Oxymetazoline HCl (MUCINEX NASAL SPRAY FULL FORCE NA) Place 1 spray into the nose daily as needed (congestion).  pantoprazole (PROTONIX) 40 MG tablet Take 1 tablet (40 mg total) by mouth daily. 90 tablet 0   Vitamin D, Ergocalciferol, (DRISDOL) 1.25 MG (50000 UNIT) CAPS capsule Take 1 capsule (50,000 Units total) by mouth every 7 (seven) days. 8 capsule 6   No facility-administered medications prior to visit.    Allergies  Allergen Reactions   Influenza Vac Split Quad Nausea And Vomiting   Influenza Virus Vaccine Split Nausea And Vomiting   Hydrochlorothiazide Other (See Comments)    Dizziness   Lisinopril Cough    ROS Review of Systems A fourteen system review of systems was performed and found to be positive as per HPI.   Objective:    Physical Exam General:  Well Developed, well nourished, appropriate for stated age.  Neuro:  Alert and oriented,  extra-ocular muscles intact  HEENT:  Normocephalic, atraumatic, neck supple, no carotid bruits appreciated  Skin:  no gross rash, warm, pink. Cardiac:  RRR, S1 S2 Respiratory: CTA B/L w/o wheezing, crackles or rales, Not using accessory muscles, speaking in full sentences- unlabored. Vascular:  Ext warm, no cyanosis apprec.; cap RF less 2 sec. Psych:  No HI/SI, judgement and insight good, Euthymic mood. Full Affect.  BP 140/79    Pulse 94    Temp 98 F (36.7 C)    Ht 6' (1.829 m)    Wt (!) 381 lb (172.8 kg)    SpO2 98%    BMI 51.67 kg/m   Wt Readings from Last 3 Encounters:  12/22/20 (!) 381 lb (172.8 kg)  12/14/20 (!) 393 lb 12.8 oz (178.6 kg)  11/30/20 (!) 404 lb 14.4 oz (183.7 kg)     Health Maintenance Due  Topic Date Due   COVID-19 Vaccine (1) Never done   OPHTHALMOLOGY EXAM  Never done   Pneumococcal Vaccine 58-21 Years old (2 - PCV) 01/11/2011   TETANUS/TDAP  01/12/2015   INFLUENZA VACCINE  08/10/2020    There are no preventive care reminders to display for this patient.  Lab Results  Component Value Date   TSH 2.310 05/01/2020   Lab Results  Component Value Date   WBC 9.8 12/15/2020   HGB 12.9 (L) 12/15/2020   HCT 38.5 (L) 12/15/2020   MCV 89.3 12/15/2020   PLT 236 12/15/2020   Lab Results  Component Value Date   NA 134 (L) 11/24/2020   K 4.5 11/24/2020   CO2 29 11/24/2020   GLUCOSE 214 (H) 11/24/2020   BUN 15 11/24/2020   CREATININE 1.12 12/14/2020   BILITOT 0.8 11/24/2020   ALKPHOS 91 11/24/2020   AST 20 11/24/2020   ALT 21 11/24/2020   PROT 7.2 11/24/2020   ALBUMIN 3.8 11/24/2020   CALCIUM 8.8 (L) 11/24/2020   ANIONGAP 7 11/24/2020   EGFR 80 07/29/2020   GFR 73.74 07/02/2018   Lab Results  Component Value Date   CHOL 175 07/29/2020   Lab Results  Component Value Date   HDL 36 (L) 07/29/2020   Lab Results  Component Value Date   LDLCALC 118 (H) 07/29/2020   Lab Results  Component Value Date   TRIG 116 07/29/2020   Lab Results  Component Value Date   CHOLHDL 4.9 07/29/2020   Lab Results  Component Value Date   HGBA1C 7.6 (H) 11/24/2020      Assessment & Plan:   Problem List Items Addressed This Visit       Cardiovascular and Mediastinum   Hypertension associated with diabetes ([facility]) (Chronic)    -BP  mildly above goal (<135/80) and is s/p gastric surgery, weight loss will also provide benefits for blood pressure. Will continue current medication regimen. Continue weight loss efforts. -Will continue to monitor. Will collect CMP with lab visit for medication  monitoring.        Endocrine   Mixed diabetic hyperlipidemia associated with type 1 diabetes mellitus ([facility]) (Chronic)    -Patient has a history of statin intolerance. -Will repeat lipid panel with fasting lab visit. -Continue weight loss efforts and follow a heart healthy diet low in saturated fats. -Will continue to monitor.      Type I diabetes mellitus ([place]) - Primary    -Followed by  Endocrinology.  -11/24/2020 A1c 7.6. -Patient reports no longer taking Metformin. On insulin lispro and Medtronic 670G pump. -Continue with ambulatory glucose monitoring. -Follow up with endocrinology as scheduled.        Other   Vitamin D deficiency (Chronic)   Relevant Medications   Vitamin D, Ergocalciferol, (DRISDOL) 1.25 MG (50000 UNIT) CAPS capsule   History of Roux-en-Y gastric bypass   Other Visit Diagnoses     Shortness of breath          Shortness of breath: -Discussed with patient no adventitious sounds on exam suggestive of pneumonia and oxygen saturation 98% RA, symptoms likely secondary to surgery and deconditioning post-surgery. Recommend to increase activity as tolerated. If symptoms fail to improve, worsen or develops new symptoms such as fever or cough then recommend obtaining chest-xray. Patient verbalized understanding. Follow up with general surgery as scheduled.   History of Roux-en-Y gastric bypass: -Followed by general surgery. -Abdominal pain most likely secondary to incisions from surgery.   Vitamin D deficiency: -Last Vitamin D 34.89, recommend to schedule lab visit for FBW including Vit D. -Continue once a week Vitamin D. Pending lab results will make treatment adjustments if indicated.  Meds ordered this encounter  Medications   Vitamin D, Ergocalciferol, (DRISDOL) 1.25 MG (50000 UNIT) CAPS capsule    Sig: Take 1 capsule (50,000 Units total) by mouth every 7 (seven) days.    Dispense:  12 capsule    Refill:  0    Follow-up: Return in about 4 months  (around 04/22/2021) for DM, HTN, HLD; lab visit later this week or next for FBW (lipid panel, cmp, cbc, vit d) .    Lorrene Reid, PA-C

## 2020-12-22 NOTE — Assessment & Plan Note (Signed)
-  Patient has a history of statin intolerance. -Will repeat lipid panel with fasting lab visit. -Continue weight loss efforts and follow a heart healthy diet low in saturated fats. -Will continue to monitor.

## 2020-12-22 NOTE — Assessment & Plan Note (Signed)
-  BP mildly above goal (<135/80) and is s/p gastric surgery, weight loss will also provide benefits for blood pressure. Will continue current medication regimen. Continue weight loss efforts. -Will continue to monitor. Will collect CMP with lab visit for medication monitoring.

## 2020-12-23 DIAGNOSIS — G894 Chronic pain syndrome: Secondary | ICD-10-CM | POA: Diagnosis not present

## 2020-12-24 ENCOUNTER — Telehealth (HOSPITAL_COMMUNITY): Payer: Self-pay | Admitting: *Deleted

## 2020-12-24 NOTE — Telephone Encounter (Signed)
Pt called the office today to inform me that he is no longer taking his Lovenox injections.  He stated that they "burned" and he is "walking around and moving enough".  I discussed the rationale for taking them (chronic co-morbidies, recent surgery, etc) and completing the 14 day regimen, but pt has already stopped taking them.  I have made MD aware.

## 2020-12-28 ENCOUNTER — Other Ambulatory Visit: Payer: Self-pay

## 2020-12-28 DIAGNOSIS — E1069 Type 1 diabetes mellitus with other specified complication: Secondary | ICD-10-CM

## 2020-12-28 DIAGNOSIS — E1065 Type 1 diabetes mellitus with hyperglycemia: Secondary | ICD-10-CM

## 2020-12-28 DIAGNOSIS — E559 Vitamin D deficiency, unspecified: Secondary | ICD-10-CM

## 2020-12-28 DIAGNOSIS — E1159 Type 2 diabetes mellitus with other circulatory complications: Secondary | ICD-10-CM

## 2020-12-29 ENCOUNTER — Other Ambulatory Visit: Payer: BC Managed Care – PPO

## 2020-12-29 ENCOUNTER — Encounter: Payer: BC Managed Care – PPO | Attending: Surgery | Admitting: Skilled Nursing Facility1

## 2020-12-29 ENCOUNTER — Other Ambulatory Visit: Payer: Self-pay

## 2020-12-29 DIAGNOSIS — E1051 Type 1 diabetes mellitus with diabetic peripheral angiopathy without gangrene: Secondary | ICD-10-CM | POA: Diagnosis not present

## 2020-12-29 DIAGNOSIS — E782 Mixed hyperlipidemia: Secondary | ICD-10-CM

## 2020-12-29 DIAGNOSIS — E559 Vitamin D deficiency, unspecified: Secondary | ICD-10-CM

## 2020-12-29 DIAGNOSIS — E1159 Type 2 diabetes mellitus with other circulatory complications: Secondary | ICD-10-CM | POA: Diagnosis not present

## 2020-12-29 DIAGNOSIS — E1069 Type 1 diabetes mellitus with other specified complication: Secondary | ICD-10-CM | POA: Diagnosis not present

## 2020-12-29 DIAGNOSIS — E1065 Type 1 diabetes mellitus with hyperglycemia: Secondary | ICD-10-CM

## 2020-12-29 DIAGNOSIS — I152 Hypertension secondary to endocrine disorders: Secondary | ICD-10-CM | POA: Diagnosis not present

## 2020-12-30 LAB — COMPREHENSIVE METABOLIC PANEL
ALT: 16 IU/L (ref 0–44)
AST: 14 IU/L (ref 0–40)
Albumin/Globulin Ratio: 1.5 (ref 1.2–2.2)
Albumin: 4.1 g/dL (ref 4.0–5.0)
Alkaline Phosphatase: 79 IU/L (ref 44–121)
BUN/Creatinine Ratio: 13 (ref 9–20)
BUN: 12 mg/dL (ref 6–24)
Bilirubin Total: 0.4 mg/dL (ref 0.0–1.2)
CO2: 25 mmol/L (ref 20–29)
Calcium: 9.6 mg/dL (ref 8.7–10.2)
Chloride: 97 mmol/L (ref 96–106)
Creatinine, Ser: 0.9 mg/dL (ref 0.76–1.27)
Globulin, Total: 2.7 g/dL (ref 1.5–4.5)
Glucose: 166 mg/dL — ABNORMAL HIGH (ref 70–99)
Potassium: 4.9 mmol/L (ref 3.5–5.2)
Sodium: 137 mmol/L (ref 134–144)
Total Protein: 6.8 g/dL (ref 6.0–8.5)
eGFR: 109 mL/min/{1.73_m2} (ref >59)

## 2020-12-30 LAB — CBC WITH DIFFERENTIAL/PLATELET
Basophils Absolute: 0 10*3/uL (ref 0.0–0.2)
Basos: 1 %
EOS (ABSOLUTE): 0.1 10*3/uL (ref 0.0–0.4)
Eos: 2 %
Hematocrit: 39.4 % (ref 37.5–51.0)
Hemoglobin: 12.9 g/dL — ABNORMAL LOW (ref 13.0–17.7)
Immature Grans (Abs): 0.1 10*3/uL (ref 0.0–0.1)
Immature Granulocytes: 2 %
Lymphocytes Absolute: 1.1 10*3/uL (ref 0.7–3.1)
Lymphs: 20 %
MCH: 29.5 pg (ref 26.6–33.0)
MCHC: 32.7 g/dL (ref 31.5–35.7)
MCV: 90 fL (ref 79–97)
Monocytes Absolute: 0.6 10*3/uL (ref 0.1–0.9)
Monocytes: 11 %
Neutrophils Absolute: 3.5 10*3/uL (ref 1.4–7.0)
Neutrophils: 64 %
Platelets: 312 10*3/uL (ref 150–450)
RBC: 4.38 x10E6/uL (ref 4.14–5.80)
RDW: 12.4 % (ref 11.6–15.4)
WBC: 5.4 10*3/uL (ref 3.4–10.8)

## 2020-12-30 LAB — LIPID PANEL
Chol/HDL Ratio: 6.2 ratio — ABNORMAL HIGH (ref 0.0–5.0)
Cholesterol, Total: 156 mg/dL (ref 100–199)
HDL: 25 mg/dL — ABNORMAL LOW (ref >39)
LDL Chol Calc (NIH): 102 mg/dL — ABNORMAL HIGH (ref 0–99)
Triglycerides: 166 mg/dL — ABNORMAL HIGH (ref 0–149)
VLDL Cholesterol Cal: 29 mg/dL (ref 5–40)

## 2020-12-30 LAB — VITAMIN D 25 HYDROXY (VIT D DEFICIENCY, FRACTURES): Vit D, 25-Hydroxy: 33.6 ng/mL (ref 30.0–100.0)

## 2020-12-30 NOTE — Progress Notes (Signed)
2 Week Post-Operative Nutrition Class   Patient was seen on 12/29/2020 for Post-Operative Nutrition education at the [facility].    Surgery date: 12/14/2020 Surgery type: RYGB Start weight at [facility]: 395.6 Weight today: 378(with prosthesis) Bowel Habits: Every day to every other day no complaints      The following the learning objectives were met by the patient during this course: Identifies Phase 3 (Soft, High Proteins) Dietary Goals and will begin from 2 weeks post-operatively to 2 months post-operatively Identifies appropriate sources of fluids and proteins  Identifies appropriate fat sources and healthy verses unhealthy fat types   States protein recommendations and appropriate sources post-operatively Identifies the need for appropriate texture modifications, mastication, and bite sizes when consuming solids Identifies appropriate fat consumption and sources Identifies appropriate multivitamin and calcium sources post-operatively Describes the need for physical activity post-operatively and will follow MD recommendations States when to call healthcare provider regarding medication questions or post-operative complications   Handouts given during class include: Phase 3A: Soft, High Protein Diet Handout Phase 3 High Protein Meals Healthy Fats   Follow-Up Plan: Patient will follow-up at [facility] in 6 weeks for 2 month post-op nutrition visit for diet advancement per MD.

## 2020-12-31 ENCOUNTER — Other Ambulatory Visit: Payer: Self-pay | Admitting: Physician Assistant

## 2020-12-31 DIAGNOSIS — E1159 Type 2 diabetes mellitus with other circulatory complications: Secondary | ICD-10-CM

## 2021-01-05 ENCOUNTER — Telehealth: Payer: Self-pay | Admitting: Skilled Nursing Facility1

## 2021-01-05 NOTE — Telephone Encounter (Signed)
RD called pt to verify fluid intake once starting soft, solid proteins 2 week post-bariatric surgery.   Daily Fluid intake: 64 oz Daily Protein intake: 80 g Bowel Habits: every 3 days with strain; advised to use mirilax   Concerns/issues:   Pt states he has been using protein shakes to bridge the gap with meeting his protein shakes.   Dietitian advised pt to measure his proteins to ensure how much he is actually getting in.   Pt states when he swallows he needs liquid due to a previous injury.

## 2021-01-09 ENCOUNTER — Other Ambulatory Visit: Payer: Self-pay | Admitting: Physician Assistant

## 2021-01-09 DIAGNOSIS — R0602 Shortness of breath: Secondary | ICD-10-CM

## 2021-01-09 DIAGNOSIS — E1159 Type 2 diabetes mellitus with other circulatory complications: Secondary | ICD-10-CM

## 2021-01-12 ENCOUNTER — Emergency Department (HOSPITAL_COMMUNITY): Payer: BC Managed Care – PPO

## 2021-01-12 ENCOUNTER — Encounter (HOSPITAL_COMMUNITY): Payer: Self-pay | Admitting: *Deleted

## 2021-01-12 ENCOUNTER — Emergency Department (HOSPITAL_COMMUNITY)
Admission: EM | Admit: 2021-01-12 | Discharge: 2021-01-12 | Disposition: A | Discharge status: Home / Self Care | Payer: BC Managed Care – PPO | Attending: Emergency Medicine | Admitting: Emergency Medicine

## 2021-01-12 ENCOUNTER — Other Ambulatory Visit: Payer: Self-pay

## 2021-01-12 DIAGNOSIS — R109 Unspecified abdominal pain: Secondary | ICD-10-CM | POA: Insufficient documentation

## 2021-01-12 DIAGNOSIS — R110 Nausea: Secondary | ICD-10-CM | POA: Insufficient documentation

## 2021-01-12 DIAGNOSIS — Z5321 Procedure and treatment not carried out due to patient leaving prior to being seen by health care provider: Secondary | ICD-10-CM | POA: Diagnosis not present

## 2021-01-12 LAB — CBC WITH DIFFERENTIAL/PLATELET
Abs Immature Granulocytes: 0.07 10*3/uL (ref 0.00–0.07)
Basophils Absolute: 0 10*3/uL (ref 0.0–0.1)
Basophils Relative: 0 %
Eosinophils Absolute: 0 10*3/uL (ref 0.0–0.5)
Eosinophils Relative: 0 %
HCT: 41.6 % (ref 39.0–52.0)
Hemoglobin: 13.3 g/dL (ref 13.0–17.0)
Immature Granulocytes: 1 %
Lymphocytes Relative: 8 %
Lymphs Abs: 0.7 10*3/uL (ref 0.7–4.0)
MCH: 29.4 pg (ref 26.0–34.0)
MCHC: 32 g/dL (ref 30.0–36.0)
MCV: 91.8 fL (ref 80.0–100.0)
Monocytes Absolute: 0.8 10*3/uL (ref 0.1–1.0)
Monocytes Relative: 8 %
Neutro Abs: 8 10*3/uL — ABNORMAL HIGH (ref 1.7–7.7)
Neutrophils Relative %: 83 %
Platelets: 255 10*3/uL (ref 150–400)
RBC: 4.53 MIL/uL (ref 4.22–5.81)
RDW: 13.8 % (ref 11.5–15.5)
WBC: 9.7 10*3/uL (ref 4.0–10.5)
nRBC: 0 % (ref 0.0–0.2)

## 2021-01-12 LAB — COMPREHENSIVE METABOLIC PANEL
ALT: 22 U/L (ref 0–44)
AST: 16 U/L (ref 15–41)
Albumin: 4 g/dL (ref 3.5–5.0)
Alkaline Phosphatase: 59 U/L (ref 38–126)
Anion gap: 13 (ref 5–15)
BUN: 13 mg/dL (ref 6–20)
CO2: 24 mmol/L (ref 22–32)
Calcium: 9.3 mg/dL (ref 8.9–10.3)
Chloride: 101 mmol/L (ref 98–111)
Creatinine, Ser: 0.92 mg/dL (ref 0.61–1.24)
GFR, Estimated: >60 mL/min (ref >60)
Glucose, Bld: 189 mg/dL — ABNORMAL HIGH (ref 70–99)
Potassium: 4.2 mmol/L (ref 3.5–5.1)
Sodium: 138 mmol/L (ref 135–145)
Total Bilirubin: 1 mg/dL (ref 0.3–1.2)
Total Protein: 7.1 g/dL (ref 6.5–8.1)

## 2021-01-12 LAB — URINALYSIS, ROUTINE W REFLEX MICROSCOPIC
Bilirubin Urine: NEGATIVE
Glucose, UA: 50 mg/dL — AB
Hgb urine dipstick: NEGATIVE
Ketones, ur: 80 mg/dL — AB
Leukocytes,Ua: NEGATIVE
Nitrite: NEGATIVE
Protein, ur: 30 mg/dL — AB
Specific Gravity, Urine: 1.021 (ref 1.005–1.030)
pH: 5 (ref 5.0–8.0)

## 2021-01-12 LAB — LIPASE, BLOOD: Lipase: 21 U/L (ref 11–51)

## 2021-01-12 MED ORDER — IOHEXOL 350 MG/ML SOLN
100.0000 mL | Freq: Once | INTRAVENOUS | Status: AC | PRN
  Administered 2021-01-12: 100 mL via INTRAVENOUS

## 2021-01-12 MED ORDER — LACTATED RINGERS IV BOLUS: 1000.0000 mL | Freq: Once | INTRAVENOUS | Status: DC

## 2021-01-12 NOTE — ED Triage Notes (Signed)
Gastric by pass Dec 5th, abd pain started last night, several BMs since pain started. Nausea without vomiting. Surgeon Dr Daphine Deutscher

## 2021-01-12 NOTE — ED Notes (Signed)
Pt let registration know he was leaving. Labels returned

## 2021-01-15 DIAGNOSIS — R197 Diarrhea, unspecified: Secondary | ICD-10-CM | POA: Diagnosis not present

## 2021-01-15 DIAGNOSIS — Z9889 Other specified postprocedural states: Secondary | ICD-10-CM | POA: Diagnosis not present

## 2021-01-15 DIAGNOSIS — Z9884 Bariatric surgery status: Secondary | ICD-10-CM | POA: Diagnosis not present

## 2021-01-15 DIAGNOSIS — R100 Acute abdomen: Secondary | ICD-10-CM | POA: Diagnosis not present

## 2021-01-25 ENCOUNTER — Telehealth: Payer: Self-pay | Admitting: Skilled Nursing Facility1

## 2021-01-25 ENCOUNTER — Telehealth (HOSPITAL_COMMUNITY): Payer: Self-pay | Admitting: *Deleted

## 2021-01-25 NOTE — Telephone Encounter (Signed)
Pts wife states he is having a Very rough time with diarrhea.  Pt states the antibiotic has not been helpful. Pts wife states the last time he had a bowel movement it was not lose which was Thursday and that was also the last time he had a bowel movement. Pts wife reports no nausea or vomiting but does feel queasy. Pt states he has been taking his medications. Stating his blood sugars have been about 120-180 averaging about 155.  Pt states he is able to tolerate his liquids Pt states he feels eggs get stuck.   24 hr recall:  2 squares cheese   2 chicken nuggets  1 hard boiled   Beverages: powerade zero, water, unsweet tea: 40 ounces    Advised not to eat fast fried food  Try broth  Try hot tea Try adding unflavored protein powder: meet your protein needs with the supplement and think of eating as for fun not to meet your needs for today and tomorrow  Common causes of dirrahea: pre made protein shakes sometimes eggs and definitely fast food Do not eat eggs  Dietitian will call you Wendsday to make sure you were able to tolerate everything

## 2021-01-25 NOTE — Telephone Encounter (Signed)
I have spoken with Michael Hutchinson, Michael Hutchinson this morning.  She emailed me over the weekend with concerns about Michael Hutchinson.  He was recently seen by Puja at the office on 01/15/21 for N/V/D after his ED visit on 01/12/21.  Pt was prescribed Flagyl for 10 days (pt is still taking medication). Per Hutchinson and pt, he is still experience N/V/D (intermittently).  Last BM was 01/22/21.  Pt has only been able to consume approx 40oz of fluid (water, Powerade, tea) daily.  Pt is not meeting protein goals (states he can no longer tolerate protein shakes). We did discuss options to increase fluid and protein intake and to reach out to [facility] for f/u.  Pt and Hutchinson states that he is "barely peeing" and feels weak.  Pt denies feeling dizzy or lightheaded.  Message sent to [facility] and Dr. Daphine Deutscher.  Will continue to monitor as needed.

## 2021-01-27 ENCOUNTER — Other Ambulatory Visit: Payer: Self-pay | Admitting: Physician Assistant

## 2021-01-27 ENCOUNTER — Other Ambulatory Visit: Payer: BC Managed Care – PPO

## 2021-01-27 DIAGNOSIS — I152 Hypertension secondary to endocrine disorders: Secondary | ICD-10-CM

## 2021-01-27 DIAGNOSIS — E1159 Type 2 diabetes mellitus with other circulatory complications: Secondary | ICD-10-CM

## 2021-01-29 ENCOUNTER — Other Ambulatory Visit: Payer: Self-pay | Admitting: Student

## 2021-01-29 DIAGNOSIS — R1310 Dysphagia, unspecified: Secondary | ICD-10-CM

## 2021-02-08 ENCOUNTER — Encounter: Payer: Self-pay | Admitting: Physician Assistant

## 2021-02-08 ENCOUNTER — Other Ambulatory Visit: Payer: Self-pay

## 2021-02-08 ENCOUNTER — Ambulatory Visit (INDEPENDENT_AMBULATORY_CARE_PROVIDER_SITE_OTHER): Payer: BC Managed Care – PPO | Admitting: Physician Assistant

## 2021-02-08 VITALS — BP 108/73 | HR 88 | Ht 72.0 in | Wt 347.0 lb

## 2021-02-08 DIAGNOSIS — H6093 Unspecified otitis externa, bilateral: Secondary | ICD-10-CM | POA: Diagnosis not present

## 2021-02-08 MED ORDER — HYDROCORTISONE-ACETIC ACID 1-2 % OT SOLN: 5.0000 [drp] | Freq: Four times a day (QID) | OTIC | 0 refills | Status: DC

## 2021-02-08 NOTE — Progress Notes (Signed)
Established patient acute visit   Patient: Michael Hutchinson   DOB: 23-Aug-1975   46 y.o. Male  MRN: [id number] Visit Date: 02/08/2021  Chief Complaint  Patient presents with   Acute Visit   Subjective    HPI  Patient presents with c/o bilateral earache. States his right ear started hurting first about 2 weeks ago. His left ear recently started hurting recently. Pain is a dull ache. Has tried OTC ear drops and ofloxacin drops from his wife which have been ineffective. No fever, runny nose, nasal congestion or otorrhea, and cough. Does report mild sinus pressure and sore throat.    Medications: Outpatient Medications Prior to Visit  Medication Sig   albuterol (VENTOLIN HFA) 108 (90 Base) MCG/ACT inhaler INHALE 1 TO 2 PUFFS INTO THE LUNGS EVERY 4 HOURS AS NEEDED FOR WHEEZING OR SHORTNESS OF BREATH.   amLODipine (NORVASC) 5 MG tablet Take 1 tablet (5 mg total) by mouth daily.   BAYER MICROLET LANCETS lancets Use as instructed to check 3 times daily.   Blood Glucose Monitoring Suppl (ONE TOUCH ULTRA SYSTEM KIT) w/Device KIT 1 kit by Does not apply route once. Check blood sugar 3 times daily. Diagnosis Diabetes ICD-10 E11.8   glucose blood (BAYER CONTOUR NEXT TEST) test strip 1 each by Other route 4 (four) times daily. And lancets 4/day   HYDROcodone-acetaminophen (NORCO/VICODIN) 5-325 MG tablet Take 1 tablet by mouth every 4 (four) hours as needed.   insulin lispro (HUMALOG) 100 UNIT/ML injection Inject 60-70 Units into the skin daily. Use via insulin pump   Insulin Syringes, Disposable, U-100 1 ML MISC 3x daily   losartan (COZAAR) 50 MG tablet TAKE 1 TABLET BY MOUTH ONCE DAILY   metoprolol succinate (TOPROL-XL) 100 MG 24 hr tablet TAKE 1 TABLET (100 MG TOTAL) BY MOUTH DAILY. TAKE WITH OR IMMEDIATELY FOLLOWING A MEAL.   ondansetron (ZOFRAN-ODT) 4 MG disintegrating tablet Take 1 tablet (4 mg total) by mouth every 6 (six) hours as needed for nausea or vomiting.   Vitamin D, Ergocalciferol,  (DRISDOL) 1.25 MG (50000 UNIT) CAPS capsule Take 1 capsule (50,000 Units total) by mouth every 7 (seven) days.   [DISCONTINUED] enoxaparin (LOVENOX) 60 MG/0.6ML injection Inject 0.6 mLs (60 mg total) into the skin 2 (two) times daily for 14 days.   [DISCONTINUED] Fluticasone Furoate (ARNUITY ELLIPTA) 100 MCG/ACT AEPB Inhale 1 puff into the lungs daily.   [DISCONTINUED] oxyCODONE (OXY IR/ROXICODONE) 5 MG immediate release tablet Take 1 tablet (5 mg total) by mouth every 6 (six) hours as needed for severe pain.   No facility-administered medications prior to visit.    Review of Systems Review of Systems:  A fourteen system review of systems was performed and found to be positive as per HPI.     Objective    BP 108/73    Pulse 88    Ht 6' (1.829 m)    Wt (!) 347 lb (157.4 kg)    SpO2 98%    BMI 47.06 kg/m    Physical Exam  General:  Well Developed, well nourished, appropriate for stated age.  Neuro:  Alert and oriented,  extra-ocular muscles intact  HEENT:  Normocephalic, atraumatic, mild tenderness at temples, no frontal or maxillary sinus tenderness, mild cerumen impaction of left ear, normal TM of left ear, mild fluid of right TM, non bulging or erythematous. Positive Tragus and Pinna signs of right ear, no adenopathy  Skin:  no gross rash, warm, pink. Cardiac:  RRR, S1 S2 Respiratory: CTA  B/L  Vascular:  Ext warm, no cyanosis apprec.; cap RF less 2 sec. Psych:  No HI/SI, judgement and insight good, Euthymic mood. Full Affect.   No results found for any visits on 02/08/21.  Assessment & Plan     Discussed with patient has s/sx consistent with otitis externa so will start acetic acid-hydrocortisone otic solution for 7-10 days. Recommend to use nasal spray such as Flonase or Nasacort. Follow up if symptoms worsen or fail to improve.    Return if symptoms worsen or fail to improve.        Lorrene Reid, PA-C  [facility] [phone]  (phone) [phone] (fax)  [place]

## 2021-02-09 NOTE — Progress Notes (Signed)
NEUROLOGY FOLLOW UP OFFICE NOTE  Michael Hutchinson [id number]  Assessment/Plan:   1  Multiple sclerosis:  While ischemic cranial nerve palsies are seen in uncontrolled diabetes, given the 3 distinct episodes of various cranial nerve palsies, the appearance of white matter lesions on brain MRI, and CSF analysis demonstrated for oligoclonal bands not present in serum, I suspect MS.  He does have significantly elevated CSF protein but does not exhibit any sign of infection.  Diabetes may be contributor to this. 2  Possible episodic cluster headaches 3  Type 1 diabetes mellitus 4  Asymmetric pupils, clouding of left pupil - suspect diabetic-related but also possibly a cataract?  He denies routine eye exams and given his uncontrolled type 1 diabetes, he should be followed by ophthalmology anyway.    DMT:  Ocrevus Check quantitative immunoglobulin panel and vit D today and in 6 months. Check MRI of brain with and without contrast - prescribed diazepam (aware that he needs a driver to and from the facility). For headache abortive therapy, will try rizatriptan $RemoveBeforeD'10mg'[id number]$ .   Refer to ophthalmology Follow up 6 months   Subjective:  Michael Hutchinson is a 46 year old right-handed Caucasian man with type 1 diabetes with history of DKA, hypertension, Charcot's joint of foot, left below the knee amputation, hyperlipidemia and GERD who follows up for multiple sclerosis   UPDATE: Current disease modifying therapy: Ocrevus (status post 1 infusion - next infusion next month) Other current medications: D3 6000 IU daily   MRI of brain was ordered last visit but was not performed.  He was feeling anxiety.  Labs 08/03/2020: IgA 322, IgG 1200, IgM 50; vit D 34.89 11/24/2020:  Hgb A1c 7.6 12/29/2020: vitamin D 33.6 01/12/2021:  CBC with WBC 9.7, ALC 0.7; Hepatic panel with t bili 1, ALP 59, AST 16, ALT 22  Usually 2 weeks before his next Ocrevus infusion, he experiences eye pain.  He describes stabbing left eye  pain associated with blurred vision, lacrimation, conjunctival injection, ptosis, photophobia, phonopobia and sometimes nausea.  May last several hours and occur several times for that time period.       Vision:  OK Motor:  Sometimes notes tremors in either hand, primarily the right.   Sensory:  No numbness. Pain:  Pain in low back radiating down legs.  Hurts to stand. . Gait:  No change Bowel/Bladder:  No issues. Fatigue:  yes.    Sleep study negative for OSA.   Cognition:  no Mood:  No depression     HISTORY:  In October 2016, he developed a right 6th cranial nerve palsy which was thought to be due to his uncontrolled type 1 diabetes.   In March 2017, he developed blurred vision in the left eye.  He saw ophthalmologist Dr. Julian Hutchinson who diagnosed optic neuritis.  MRI of the brain and orbits with and without contrast from 05/11/2015 revealed "2 T2 hyperintense lesions within the left hemisphere adjacent to the posterior horn of the left lateral ventricle ", a nonspecific finding and without abnormal contrast enhancement.   In March 2019, he developed disconjugate gaze and droopy left eyelid.  He saw the eye doctor who diagnosed a left 3rd nerve palsy.  He was evaluated in the ED at [facility] on 03/28/2017.  CTA of the head was negative for aneurysm.  MRI of the brain with and without contrast demonstrated multiple T2/flair hyperintense foci involving the periventricular, deep and subcortical white matter, progressed compared to prior MRI from 2017.  In-house neurology believed his findings were more likely due to a diabetic 3rd nerve palsy rather than MS.  He was advised to start aspirin 81 mg daily and follow-up with outpatient neurology.  He subsequently underwent a work-up for multiple sclerosis.  MRI of the cervical spine with and without contrast from 05/04/2017 revealed no cord lesions.  He underwent lumbar puncture on 05/19/2017 for CSF analysis which revealed elevated IgG index  of 0.79 and 4 oligoclonal bands not present in the corresponding serum.  Cell count was 1, elevated protein 157, elevated glucose 100, negative myelin basic protein, ACE 4, Gram stain and culture negative.   Past disease modifying therapy: Tecfidera (GI upset), Ocrevus (hives but declined premedicated Benadryl)   Imaging: 05/11/2015 MRI BRAIN W WO:  Two T2 hyperintense lesions within the left hemisphere adjacent to the posterior horn of the left lateral ventricle. The finding is nonspecific but can be seen in the setting of chronic microvascular ischemia, a demyelinating process such as multiple sclerosis, vasculitis, complicated migraine headaches, or as the sequelae of a prior infectious or inflammatory process. 05/11/2015 MRI ORBITS W WO:  Within normal limits 03/28/2017 MRI BRAIN & ORBITS W WO:  1. Multiple T2/FLAIR hyperintense foci involving the periventricular, deep, and subcortical white matter of both cerebral hemispheres in a distribution highly suspicious for possible demyelinating disease/multiple sclerosis. Changes have progressed relative to 2017. No evidence for active demyelination.  2. Negative MRI of the orbits. No findings to suggest acute optic neuritis or other abnormality identified. 3. Bilateral mastoid effusions, left greater than right. 05/04/2017 MRI CERVICAL SPINE W WO:  Normal 02/25/2018 MRI BRAIN W WO: No change since the study of March 2019. Multiple foci of abnormal T2 and FLAIR signal within the cerebral hemispheric deep and subcortical white matter, consistent with the clinical diagnosis of multiple sclerosis. No new or progressive lesions. No lesions show contrast enhancement or restricted diffusion.  PAST MEDICAL HISTORY: Past Medical History:  Diagnosis Date   Asthma    Charcot's joint of foot 70/96/2836   Complication of anesthesia    "I wake up angry" (12/31/2015)   Diabetes mellitus type 1 ([place]) dx'd 1981   Diabetic ketoacidosis ([place])    Essential  hypertension 05/03/2013   GERD (gastroesophageal reflux disease)    High cholesterol    Hx MRSA infection    Inner thigh and under arm- healed areas   Meniscus tear    Pneumonia    2016 ish    MEDICATIONS: Current Outpatient Medications on File Prior to Visit  Medication Sig Dispense Refill   acetic acid-hydrocortisone (VOSOL-HC) OTIC solution Place 5 drops into both ears 4 (four) times daily. 10 mL 0   albuterol (VENTOLIN HFA) 108 (90 Base) MCG/ACT inhaler INHALE 1 TO 2 PUFFS INTO THE LUNGS EVERY 4 HOURS AS NEEDED FOR WHEEZING OR SHORTNESS OF BREATH. 8.5 g 2   amLODipine (NORVASC) 5 MG tablet Take 1 tablet (5 mg total) by mouth daily. 90 tablet 1   BAYER MICROLET LANCETS lancets Use as instructed to check 3 times daily. 100 each 12   Blood Glucose Monitoring Suppl (ONE TOUCH ULTRA SYSTEM KIT) w/Device KIT 1 kit by Does not apply route once. Check blood sugar 3 times daily. Diagnosis Diabetes ICD-10 E11.8     glucose blood (BAYER CONTOUR NEXT TEST) test strip 1 each by Other route 4 (four) times daily. And lancets 4/day 400 each 3   HYDROcodone-acetaminophen (NORCO/VICODIN) 5-325 MG tablet Take 1 tablet by mouth every 4 (four)  hours as needed.     insulin lispro (HUMALOG) 100 UNIT/ML injection Inject 60-70 Units into the skin daily. Use via insulin pump     Insulin Syringes, Disposable, U-100 1 ML MISC 3x daily 50 each 2   losartan (COZAAR) 50 MG tablet TAKE 1 TABLET BY MOUTH ONCE DAILY 90 tablet 1   metoprolol succinate (TOPROL-XL) 100 MG 24 hr tablet TAKE 1 TABLET (100 MG TOTAL) BY MOUTH DAILY. TAKE WITH OR IMMEDIATELY FOLLOWING A MEAL. 90 tablet 0   ondansetron (ZOFRAN-ODT) 4 MG disintegrating tablet Take 1 tablet (4 mg total) by mouth every 6 (six) hours as needed for nausea or vomiting. 20 tablet 0   Vitamin D, Ergocalciferol, (DRISDOL) 1.25 MG (50000 UNIT) CAPS capsule Take 1 capsule (50,000 Units total) by mouth every 7 (seven) days. 12 capsule 0   No current facility-administered  medications on file prior to visit.    ALLERGIES: Allergies  Allergen Reactions   Influenza Vac Split Quad Nausea And Vomiting   Influenza Virus Vaccine Split Nausea And Vomiting   Hydrochlorothiazide Other (See Comments)    Dizziness   Lisinopril Cough    FAMILY HISTORY: Family History  Problem Relation Age of Onset   Other Mother    Cancer Mother        Breast / Bone   Heart attack Father    Hypertension Father    Hyperlipidemia Father    Diabetes Other    Alcohol abuse Sister    Diabetes Maternal Grandfather    Stroke Paternal Grandmother    Alcohol abuse Paternal Grandfather       Objective:  Blood pressure 125/71, pulse (!) 120, height 6' (1.829 m), weight (!) 342 lb (155.1 kg), SpO2 97 %. General: No acute distress.  Patient appears well-groomed.   Head:  Normocephalic/atraumatic Eyes:  Fundi examined but not visualized Neck: supple, no paraspinal tenderness, full range of motion Heart:  Regular rate and rhythm Lungs:  Clear to auscultation bilaterally Back: No paraspinal tenderness Neurological Exam:  alert and oriented to person, place, and time. Speech fluent and not dysarthric, language intact.  Asymmetric pupils (OD > OS), OS poorly reactive with clouding.  Otherwise, CN II-XII intact. Bulk and tone normal, muscle strength 5/5 throughout (left BTK amputation); vibration reduced in foot.  Deep tendon reflexes absent throughout.  Finger to nose and heel to shin testing intact.  Gait stable, Romberg negative.   Metta Clines, DO  CC: Lorrene Reid, PA-C

## 2021-02-10 ENCOUNTER — Encounter: Payer: Self-pay | Admitting: Neurology

## 2021-02-10 ENCOUNTER — Other Ambulatory Visit (INDEPENDENT_AMBULATORY_CARE_PROVIDER_SITE_OTHER): Payer: BC Managed Care – PPO

## 2021-02-10 ENCOUNTER — Other Ambulatory Visit: Payer: Self-pay

## 2021-02-10 ENCOUNTER — Ambulatory Visit (INDEPENDENT_AMBULATORY_CARE_PROVIDER_SITE_OTHER): Payer: BC Managed Care – PPO | Admitting: Neurology

## 2021-02-10 ENCOUNTER — Encounter: Payer: BC Managed Care – PPO | Attending: Surgery | Admitting: Skilled Nursing Facility1

## 2021-02-10 VITALS — BP 125/71 | HR 120 | Ht 72.0 in | Wt 342.0 lb

## 2021-02-10 DIAGNOSIS — G35 Multiple sclerosis: Secondary | ICD-10-CM

## 2021-02-10 DIAGNOSIS — E669 Obesity, unspecified: Secondary | ICD-10-CM | POA: Diagnosis not present

## 2021-02-10 DIAGNOSIS — E1051 Type 1 diabetes mellitus with diabetic peripheral angiopathy without gangrene: Secondary | ICD-10-CM | POA: Insufficient documentation

## 2021-02-10 DIAGNOSIS — R9082 White matter disease, unspecified: Secondary | ICD-10-CM

## 2021-02-10 DIAGNOSIS — H4902 Third [oculomotor] nerve palsy, left eye: Secondary | ICD-10-CM | POA: Diagnosis not present

## 2021-02-10 DIAGNOSIS — E1069 Type 1 diabetes mellitus with other specified complication: Secondary | ICD-10-CM | POA: Diagnosis not present

## 2021-02-10 DIAGNOSIS — G44019 Episodic cluster headache, not intractable: Secondary | ICD-10-CM | POA: Diagnosis not present

## 2021-02-10 MED ORDER — RIZATRIPTAN BENZOATE 10 MG PO TBDP: ORAL_TABLET | ORAL | 5 refills | Status: DC

## 2021-02-10 MED ORDER — DIAZEPAM 10 MG PO TABS: ORAL_TABLET | ORAL | 0 refills | Status: DC

## 2021-02-10 NOTE — Progress Notes (Signed)
Bariatric Nutrition Follow-Up Visit Medical Nutrition Therapy    NUTRITION ASSESSMENT   Anthropometrics   Surgery date: 12/14/2020 Surgery type: RYGB Start weight at [facility]: 395.6 Weight today: 342.3  Clinical  Medical hx: BKA, Type 1 diabetes on insulin pump/CGM, GERD Medications: beef supplements   Labs: vitmain D 34.89, HDL 36, A1C 7.1, RBC 3.74, Hemoglobin 12, HCT 35.6 Notable signs/symptoms: in wheel chair due to ill fitting prosthesis  Any previous deficiencies? No   Lifestyle & Dietary Hx  Pt states she has the beginning stages of an ear infection so he is having trouble hearing.   Pt states anything he eats he has nausea and vomits about 2 times a week.   Pt states he does not throw up or feel nausea with shrimp. Pt states he hates fish but states he will try it.  Shrimp, refried, beans, cheese are easily tolerated.   Pt states the below definitely make him sick: Protein shakes Yogurt (does not throw it up but has regurgitation with it) Eggs  Pt states he has been having headaches and migraines stating his blood sugars and blood pressures have been normal.   Estimated daily fluid intake: 52 oz Estimated daily protein intake: 14 g Supplements: multivitamin and calcium Current average weekly physical activity: ADL's   24-Hr Dietary Recall: wakes 4:30am First Meal 7:30am : cheesetick and some swallows of V8 juice Snack:   Second Meal:  Snack:   Third Meal:  Snack: cheeses tick Beverages: original V8 juice, water, unsweet tea peach  Post-Op Goals/ Signs/ Symptoms Using straws: no Drinking while eating: yes Chewing/swallowing difficulties: no Changes in vision: no Changes to mood/headaches: yes Hair loss/changes to skin/nails: no Difficulty focusing/concentrating: no Sweating: no Limb weakness: no Dizziness/lightheadedness: no Palpitations: no  Carbonated/caffeinated beverages: no N/V/D/C/Gas: yes Abdominal pain: yes Dumping syndrome: no     NUTRITION DIAGNOSIS  Overweight/obesity ([place]-3.3) related to past poor dietary habits and physical inactivity as evidenced by completed bariatric surgery and following dietary guidelines for continued weight loss and healthy nutrition status.     NUTRITION INTERVENTION Nutrition counseling (C-1) and education (E-2) to facilitate bariatric surgery goals, including: Diet advancement to the next phase (phase 4) now including non starchy vegatbles  The importance of consuming adequate calories as well as certain nutrients daily due to the body's need for essential vitamins, minerals, and fats The importance of daily physical activity and to reach a goal of at least 150 minutes of moderate to vigorous physical activity weekly (or as directed by their physician) due to benefits such as increased musculature and improved lab values The importance of intuitive eating specifically learning hunger-satiety cues and understanding the importance of learning a new body: The importance of mindful eating to avoid grazing behaviors   Goals: -sit up straight when eating -make each bite the size of a dime -chew each dime sized bite until applesauce consistency  -eat at the table not in front of the TV -do not drink even a sip 15 minutes before your meal nothing with your meal and wait until 30 minutes after your meal before you drink again  -do Mirilax once in the morning and once int eh evening daily  -moisture is extremely important so be sure to make your foods veery moist -take your multivitamin after your have eaten lunch or dinner; try the capsule  -Eat the proteins you can tolerate every 3 hours 2-3 ounces at a time -try the unflavored protein powder in whatever; such as V8 juice -meet  80 grams of protein and 64 fluid ounces daily  Handouts Provided Include  Phase 4  Learning Style & Readiness for Change Teaching method utilized: Visual & Auditory  Demonstrated degree of understanding via: Teach  Back  Readiness Level: action Barriers to learning/adherence to lifestyle change: GI intolerances   RD's Notes for Next Visit Assess adherence to pt chosen goals     MONITORING & EVALUATION Dietary intake, weekly physical activity, body weight  Next Steps Patient is to follow-up in 6 weeks: please call if any further issues or intolerances

## 2021-02-10 NOTE — Patient Instructions (Addendum)
°  Continue Ocrevus Take rizatriptan at earliest onset of headache.  May repeat dose once in 2 hours if needed.  Maximum 2 tablets in 24 hours. Check quantitative immunoglobulin panel and vit D level Check MRI of brain with and without contrast.We have sent a referral to [facility] for your MRI and they will call you directly to schedule your appointment. They are located at [place]. If you need to contact them directly please call [phone].  Refer to ophthalmology Follow up in 6 months.

## 2021-02-11 LAB — IGG, IGA, IGM
IgG (Immunoglobin G), Serum: 1085 mg/dL (ref 600–1640)
IgM, Serum: 46 mg/dL — ABNORMAL LOW (ref 50–300)
Immunoglobulin A: 354 mg/dL — ABNORMAL HIGH (ref 47–310)

## 2021-02-11 LAB — VITAMIN D 25 HYDROXY (VIT D DEFICIENCY, FRACTURES): VITD: 42.82 ng/mL (ref 30.00–100.00)

## 2021-02-12 ENCOUNTER — Ambulatory Visit
Admission: RE | Admit: 2021-02-12 | Discharge: 2021-02-12 | Disposition: A | Discharge status: Home / Self Care | Payer: BC Managed Care – PPO | Source: Ambulatory Visit | Attending: Student | Admitting: Student

## 2021-02-12 ENCOUNTER — Other Ambulatory Visit: Payer: Self-pay | Admitting: Student

## 2021-02-12 DIAGNOSIS — R1310 Dysphagia, unspecified: Secondary | ICD-10-CM | POA: Diagnosis not present

## 2021-02-12 DIAGNOSIS — K224 Dyskinesia of esophagus: Secondary | ICD-10-CM | POA: Diagnosis not present

## 2021-02-12 NOTE — Progress Notes (Signed)
Pt advised of his lab results. Per pt he hasn't been taken 6000 IU. Advised please start.

## 2021-02-22 DIAGNOSIS — G35 Multiple sclerosis: Secondary | ICD-10-CM | POA: Diagnosis not present

## 2021-02-23 DIAGNOSIS — Z794 Long term (current) use of insulin: Secondary | ICD-10-CM | POA: Diagnosis not present

## 2021-02-23 DIAGNOSIS — E1040 Type 1 diabetes mellitus with diabetic neuropathy, unspecified: Secondary | ICD-10-CM | POA: Diagnosis not present

## 2021-02-24 DIAGNOSIS — G35 Multiple sclerosis: Secondary | ICD-10-CM | POA: Diagnosis not present

## 2021-03-01 ENCOUNTER — Other Ambulatory Visit: Payer: BC Managed Care – PPO

## 2021-03-15 ENCOUNTER — Ambulatory Visit (INDEPENDENT_AMBULATORY_CARE_PROVIDER_SITE_OTHER): Payer: BC Managed Care – PPO | Admitting: Nurse Practitioner

## 2021-03-15 ENCOUNTER — Encounter: Payer: Self-pay | Admitting: Nurse Practitioner

## 2021-03-15 ENCOUNTER — Other Ambulatory Visit: Payer: Self-pay

## 2021-03-15 VITALS — BP 106/70 | HR 82 | Temp 98.3°F | Ht 72.0 in | Wt 328.1 lb

## 2021-03-15 DIAGNOSIS — H60311 Diffuse otitis externa, right ear: Secondary | ICD-10-CM | POA: Diagnosis not present

## 2021-03-15 MED ORDER — CIPROFLOXACIN-DEXAMETHASONE 0.3-0.1 % OT SUSP: 4.0000 [drp] | Freq: Two times a day (BID) | OTIC | 0 refills | Status: DC

## 2021-03-15 NOTE — Progress Notes (Signed)
Established patient visit ? ? ?Patient: Michael Hutchinson   DOB: Apr 06, 1975   46 y.o. Male  MRN: [id number] ?Visit Date: 03/15/2021 ? ?Chief Complaint  ?Patient presents with  ? Ear Fullness  ? ?Subjective  ?  ?Ear Fullness  ?There is pain in the right ear. This is a chronic problem. The current episode started 1 to 4 weeks ago. The problem occurs constantly. The problem has been unchanged. Associated symptoms include hearing loss. Pertinent negatives include no abdominal pain, coughing, diarrhea, ear discharge, headaches, rash, rhinorrhea, sore throat or vomiting. Treatments tried: had previous prescription forear driops which didn't seem to help.   ? ? ?Medications: ?Outpatient Medications Prior to Visit  ?Medication Sig  ? albuterol (VENTOLIN HFA) 108 (90 Base) MCG/ACT inhaler INHALE 1 TO 2 PUFFS INTO THE LUNGS EVERY 4 HOURS AS NEEDED FOR WHEEZING OR SHORTNESS OF BREATH.  ? amLODipine (NORVASC) 5 MG tablet Take 1 tablet (5 mg total) by mouth daily.  ? BAYER MICROLET LANCETS lancets Use as instructed to check 3 times daily.  ? Blood Glucose Monitoring Suppl (ONE TOUCH ULTRA SYSTEM KIT) w/Device KIT 1 kit by Does not apply route once. Check blood sugar 3 times daily. Diagnosis Diabetes ICD-10 E11.8  ? diazepam (VALIUM) 10 MG tablet Take 1 tablet 30 to 40 minutes prior to MRI.  May take 1 tablet just before MRI if needed.  ? glucose blood (BAYER CONTOUR NEXT TEST) test strip 1 each by Other route 4 (four) times daily. And lancets 4/day  ? HYDROcodone-acetaminophen (NORCO/VICODIN) 5-325 MG tablet Take 1 tablet by mouth every 4 (four) hours as needed.  ? insulin lispro (HUMALOG) 100 UNIT/ML injection Inject 60-70 Units into the skin daily. Use via insulin pump  ? Insulin Syringes, Disposable, U-100 1 ML MISC 3x daily  ? losartan (COZAAR) 50 MG tablet TAKE 1 TABLET BY MOUTH ONCE DAILY  ? metoprolol succinate (TOPROL-XL) 100 MG 24 hr tablet TAKE 1 TABLET (100 MG TOTAL) BY MOUTH DAILY. TAKE WITH OR IMMEDIATELY FOLLOWING A  MEAL.  ? ondansetron (ZOFRAN-ODT) 4 MG disintegrating tablet Take 1 tablet (4 mg total) by mouth every 6 (six) hours as needed for nausea or vomiting.  ? rizatriptan (MAXALT-MLT) 10 MG disintegrating tablet Take 1 tablet earliest onset of headache.  May repeat after 2 hours if needed. Maximum 2 tablets in 24 hours  ? Vitamin D, Ergocalciferol, (DRISDOL) 1.25 MG (50000 UNIT) CAPS capsule Take 1 capsule (50,000 Units total) by mouth every 7 (seven) days.  ? [DISCONTINUED] acetic acid-hydrocortisone (VOSOL-HC) OTIC solution Place 5 drops into both ears 4 (four) times daily.  ? ?No facility-administered medications prior to visit.  ? ? ?Review of Systems  ?Constitutional:  Negative for activity change, chills, fatigue and fever.  ?HENT:  Positive for hearing loss. Negative for congestion, ear discharge, postnasal drip, rhinorrhea, sinus pressure, sinus pain, sneezing and sore throat.   ?Eyes: Negative.   ?Respiratory:  Negative for cough, shortness of breath and wheezing.   ?Cardiovascular:  Negative for chest pain and palpitations.  ?Gastrointestinal:  Negative for abdominal pain, constipation, diarrhea, nausea and vomiting.  ?Endocrine: Negative for cold intolerance, heat intolerance, polydipsia and polyuria.  ?Genitourinary:  Negative for dysuria, frequency and urgency.  ?Musculoskeletal:  Negative for back pain and myalgias.  ?Skin:  Negative for rash.  ?Allergic/Immunologic: Negative for environmental allergies.  ?Neurological:  Negative for dizziness, weakness and headaches.  ?Psychiatric/Behavioral:  The patient is not nervous/anxious.   ? ? Objective  ?  ? ?Today's  Vitals  ? 03/15/21 1357  ?BP: 106/70  ?Pulse: 82  ?Temp: 98.3 ?F (36.8 ?C)  ?SpO2: 97%  ?Weight: (!) 328 lb 1.9 oz (148.8 kg)  ?Height: 6' (1.829 m)  ? ?Body mass index is 44.5 kg/m?.  ? ?Physical Exam ?Vitals and nursing note reviewed.  ?Constitutional:   ?   Appearance: Normal appearance. He is well-developed.  ?HENT:  ?   Head: Normocephalic.  ?    Right Ear: Ear canal and external ear normal. Swelling and tenderness present. Tympanic membrane is erythematous and bulging.  ?   Left Ear: Hearing, ear canal and external ear normal. Tympanic membrane is bulging.  ?Eyes:  ?   Pupils: Pupils are equal, round, and reactive to light.  ?Cardiovascular:  ?   Rate and Rhythm: Normal rate and regular rhythm.  ?   Pulses: Normal pulses.  ?   Heart sounds: Normal heart sounds.  ?Pulmonary:  ?   Effort: Pulmonary effort is normal.  ?   Breath sounds: Normal breath sounds.  ?Abdominal:  ?   Palpations: Abdomen is soft.  ?Musculoskeletal:     ?   General: Normal range of motion.  ?   Cervical back: Normal range of motion and neck supple.  ?Lymphadenopathy:  ?   Cervical: No cervical adenopathy.  ?Skin: ?   General: Skin is warm and dry.  ?   Capillary Refill: Capillary refill takes less than 2 seconds.  ?Neurological:  ?   General: No focal deficit present.  ?   Mental Status: He is alert and oriented to person, place, and time.  ?Psychiatric:     ?   Mood and Affect: Mood normal.     ?   Behavior: Behavior normal.     ?   Thought Content: Thought content normal.     ?   Judgment: Judgment normal.  ?  ? ? Assessment & Plan  ?  ? ?1. Acute diffuse otitis externa of right ear ?Ciprodex eardrops should be used.  4 drops into the right ear twice daily for next 7 days.  Advised patient to contact the office if no improvement over next 2 to 3 days.  Consider referral to ENT. ?- ciprofloxacin-dexamethasone (CIPRODEX) OTIC suspension; Place 4 drops into the right ear 2 (two) times daily.  Dispense: 7.5 mL; Refill: 0  ? ?Return for prn worsening or persistent symptoms.  ?   ? ? ? ?Ronnell Freshwater, NP  ?[facility] ?[phone] (phone) ?[phone] (fax) ? ?[facility] ?

## 2021-03-17 ENCOUNTER — Other Ambulatory Visit: Payer: BC Managed Care – PPO

## 2021-03-21 DIAGNOSIS — H60311 Diffuse otitis externa, right ear: Secondary | ICD-10-CM | POA: Insufficient documentation

## 2021-03-25 ENCOUNTER — Ambulatory Visit: Payer: BC Managed Care – PPO | Admitting: Skilled Nursing Facility1

## 2021-03-26 ENCOUNTER — Other Ambulatory Visit: Payer: BC Managed Care – PPO

## 2021-03-29 ENCOUNTER — Encounter: Payer: BC Managed Care – PPO | Attending: Surgery | Admitting: Skilled Nursing Facility1

## 2021-03-29 DIAGNOSIS — E1051 Type 1 diabetes mellitus with diabetic peripheral angiopathy without gangrene: Secondary | ICD-10-CM | POA: Insufficient documentation

## 2021-03-29 DIAGNOSIS — E108 Type 1 diabetes mellitus with unspecified complications: Secondary | ICD-10-CM | POA: Insufficient documentation

## 2021-03-29 DIAGNOSIS — E1065 Type 1 diabetes mellitus with hyperglycemia: Secondary | ICD-10-CM | POA: Insufficient documentation

## 2021-03-31 ENCOUNTER — Other Ambulatory Visit: Payer: BC Managed Care – PPO

## 2021-04-05 ENCOUNTER — Other Ambulatory Visit: Payer: Self-pay

## 2021-04-05 ENCOUNTER — Encounter: Payer: BC Managed Care – PPO | Admitting: Skilled Nursing Facility1

## 2021-04-05 DIAGNOSIS — E1051 Type 1 diabetes mellitus with diabetic peripheral angiopathy without gangrene: Secondary | ICD-10-CM | POA: Diagnosis not present

## 2021-04-05 DIAGNOSIS — E108 Type 1 diabetes mellitus with unspecified complications: Secondary | ICD-10-CM | POA: Diagnosis not present

## 2021-04-05 DIAGNOSIS — E1065 Type 1 diabetes mellitus with hyperglycemia: Secondary | ICD-10-CM | POA: Diagnosis not present

## 2021-04-05 NOTE — Progress Notes (Signed)
Bariatric Nutrition Follow-Up Visit ?Medical Nutrition Therapy  ? ? ?NUTRITION ASSESSMENT ?  ?Anthropometrics  ? ?Surgery date: 12/14/2020 ?Surgery type: RYGB ?Start weight at [facility]: 395.6 ?Weight today: 321 pounds ? ?Clinical  ?Medical hx: BKA, Type 1 diabetes on insulin pump/CGM, GERD ?Medications: beef supplements   ?Labs: vitmain D 42.82 ?Notable signs/symptoms: in wheel chair due to ill fitting prosthesis  ?Any previous deficiencies? No ?  ?Lifestyle & Dietary Hx ? ?Pt states he is no longer experiencing any GI issues and no longer having any headaches or migraines. Pt states he will have random days not often anymore where he cannot tolerate anything. Pt states he has daily acid reflux stating the medicine does not touch it. Pt states he feels most things are always getting stuck and feels hot and burning in his chest; stating he wakes with this sensation.  ? ?Pt states he has had low blood sugars every night: 50-60's 4-5 nights a week (has an upcomming appt with Pump educator for this). Pt states his numbers are usually about 150. Pt states when his low is occuring he does not correct he suspends his insulin and stays up to watch it.  ?Pt states liquids go down fine but chicken does not go well at all.  ?Pt states he will add unflavored protein powder to V8 juice but has not recently.  ? ? ?Estimated daily fluid intake: 52 oz ?Estimated daily protein intake: 14 g ?Supplements: multivitamin and calcium ?Current average weekly physical activity: ADL's  ? ?24-Hr Dietary Recall: wakes 4:30am ?First Meal 8 am: 1 egg or 1/2 eggs or half sausage biscuit ?Snack:   ?Second Meal: skipped ?Snack:   ?Third Meal 6-7pm: steak and cheese + mustard + garlic or chicken in the air fryer + BBQ sauce + green beans or collards or chicken in the crockpot with cream of mushroom soup ?Snack: cheese ?Beverages: original V8 juice, water, unsweet tea peach, powerade zero ? ?Post-Op Goals/ Signs/ Symptoms ?Using straws: no ?Drinking  while eating: yes ?Chewing/swallowing difficulties: no ?Changes in vision: no ?Changes to mood/headaches: yes ?Hair loss/changes to skin/nails: no ?Difficulty focusing/concentrating: no ?Sweating: no ?Limb weakness: no ?Dizziness/lightheadedness: no ?Palpitations: no  ?Carbonated/caffeinated beverages: no ?N/V/D/C/Gas: yes ?Abdominal pain: yes ?Dumping syndrome: no ? ?  ?NUTRITION DIAGNOSIS  ?Overweight/obesity ([place]-3.3) related to past poor dietary habits and physical inactivity as evidenced by completed bariatric surgery and following dietary guidelines for continued weight loss and healthy nutrition status. ?  ?  ?NUTRITION INTERVENTION ?Nutrition counseling (C-1) and education (E-2) to facilitate bariatric surgery goals, including: ?The importance of consuming adequate calories as well as certain nutrients daily due to the body's need for essential vitamins, minerals, and fats ?The importance of daily physical activity and to reach a goal of at least 150 minutes of moderate to vigorous physical activity weekly (or as directed by their physician) due to benefits such as increased musculature and improved lab values ?The importance of intuitive eating specifically learning hunger-satiety cues and understanding the importance of learning a new body: The importance of mindful eating to avoid grazing behaviors  ? ?Goals: Reiterated  ?-sit up straight when eating ?-make each bite the size of a dime ?-chew each dime sized bite until applesauce consistency  ?-eat at the table not in front of the TV ?-do not drink even a sip 15 minutes before your meal nothing with your meal and wait until 30 minutes after your meal before you drink again  ?-do Mirilax once in the morning  and once int eh evening daily  ?-moisture is extremely important so be sure to make your foods very moist ?-take your multivitamin after your have eaten lunch or dinner; try the capsule  ?-Eat the proteins you can tolerate every 3 hours 2-3 ounces at a  time ?-try the unflavored protein powder in whatever; such as V8 juice ?-meet 80 grams of protein and 64 fluid ounces daily ?-try alkaline water pH 8.8 ?-follow the tools given in the handout for dysphagia type issues ? ?Handouts Provided Include  ?Moist foods: Dysphagic issues  ? ?Learning Style & Readiness for Change ?Teaching method utilized: Visual & Auditory  ?Demonstrated degree of understanding via: Teach Back  ?Readiness Level: contemplative  ?Barriers to learning/adherence to lifestyle change: Acid reflux ? ?RD's Notes for Next Visit ?Assess adherence to pt chosen goals ? ? ?MONITORING & EVALUATION ?Dietary intake, weekly physical activity, body weight ? ?Next Steps ?Patient is to follow-up with Cristy Folks Diabetes/insulin pump Educator  ?

## 2021-04-06 ENCOUNTER — Encounter: Payer: BC Managed Care – PPO | Admitting: Nutrition

## 2021-04-06 DIAGNOSIS — E1051 Type 1 diabetes mellitus with diabetic peripheral angiopathy without gangrene: Secondary | ICD-10-CM | POA: Diagnosis not present

## 2021-04-06 DIAGNOSIS — E108 Type 1 diabetes mellitus with unspecified complications: Secondary | ICD-10-CM | POA: Diagnosis not present

## 2021-04-06 DIAGNOSIS — E1065 Type 1 diabetes mellitus with hyperglycemia: Secondary | ICD-10-CM | POA: Diagnosis not present

## 2021-04-06 NOTE — Progress Notes (Signed)
Pateint is here today because he is having 4-5 low blood sugars per week.  Says must go to bed with blood sugar above 160, or will drop low.  Last night blood sugar at HS 8:30PM: 165.  At 2AM his blood sugar was alarming at 65.  He put the pump in suspend all night, ate1 pop tart, and FBS today was 104    He is also not bolusing before any meals.  Bfast 2eggs with cheese, and water to drink, and lunch is mash potatoes.  He ate 3 hours ago, and blood sugar is now 121 with no boluses ?He has been out of auto mode  since surgery, due to problems with lows.   ?Current basal rate:  1.25u/hr/  30u/24 hour  I/C: 12, ISF: 50.   ?New basal rate: MN: 0.9u/hr,  5AM:  1.1, 10PM: 0.9  25u/24 hours.   ?Pt. Was instructed to bolus for all carbs eaten now, and to remain out of auto mode for 7 days.  He agreed to do this, and had no final questions.  He will call me Monday with blood sugar readings.  User name: [email or web address]  PW: [email or web address],  ?

## 2021-04-06 NOTE — Patient Instructions (Signed)
Bolus for all meals eaten ?Keep pump in manual mode for 7 days ?Call if blood sugar readings continue to drop low or remain over 160.   ?

## 2021-04-07 DIAGNOSIS — M25561 Pain in right knee: Secondary | ICD-10-CM | POA: Diagnosis not present

## 2021-04-08 DIAGNOSIS — M25561 Pain in right knee: Secondary | ICD-10-CM | POA: Diagnosis not present

## 2021-04-09 DIAGNOSIS — M25561 Pain in right knee: Secondary | ICD-10-CM | POA: Diagnosis not present

## 2021-04-21 ENCOUNTER — Ambulatory Visit: Payer: BC Managed Care – PPO | Admitting: Physician Assistant

## 2021-04-21 DIAGNOSIS — M25561 Pain in right knee: Secondary | ICD-10-CM | POA: Diagnosis not present

## 2021-04-21 DIAGNOSIS — Z89512 Acquired absence of left leg below knee: Secondary | ICD-10-CM | POA: Diagnosis not present

## 2021-04-21 NOTE — Progress Notes (Signed)
?Established patient visit ? ? ?Patient: Michael Hutchinson   DOB: 21-Aug-1975   46 y.o. Male  MRN: [id number] ?Visit Date: 04/23/2021 ? ?Chief Complaint  ?Patient presents with  ? Follow-up  ? ?Subjective  ?  ?HPI  ?Patient presents for follow-up on diabetes, hypertension and hyperlipidemia.  ? ?Diabetes mellitus type 1: Followed by Endocrinology. Pt denies increased urination or thirst. Pt reports medication compliance but having issues with insulin pump, plans to discuss with endocrinologist. Does have hypoglycemic events. Continues to see nutritionist post his bariatric surgery and recently saw diabetes nutritionist. ? ?HTN: Pt denies chest pain, palpitations, dizziness or severe lower extremity swelling. Taking medication as directed without side effects.  ? ?HLD: Pt reports unable to tolerate statin medications. Continues to work on weight loss and follows a nutritionist. With current weight loss which he accounts 92 pounds has felt much better and is more active around the house helping with chores and yardwork.  ? ? ?Medications: ?Outpatient Medications Prior to Visit  ?Medication Sig  ? albuterol (VENTOLIN HFA) 108 (90 Base) MCG/ACT inhaler INHALE 1 TO 2 PUFFS INTO THE LUNGS EVERY 4 HOURS AS NEEDED FOR WHEEZING OR SHORTNESS OF BREATH.  ? amLODipine (NORVASC) 5 MG tablet Take 1 tablet (5 mg total) by mouth daily.  ? BAYER MICROLET LANCETS lancets Use as instructed to check 3 times daily.  ? Blood Glucose Monitoring Suppl (ONE TOUCH ULTRA SYSTEM KIT) w/Device KIT 1 kit by Does not apply route once. Check blood sugar 3 times daily. Diagnosis Diabetes ICD-10 E11.8  ? glucose blood (BAYER CONTOUR NEXT TEST) test strip 1 each by Other route 4 (four) times daily. And lancets 4/day  ? HYDROcodone-acetaminophen (NORCO/VICODIN) 5-325 MG tablet Take 1 tablet by mouth every 4 (four) hours as needed.  ? insulin lispro (HUMALOG) 100 UNIT/ML injection Inject 60-70 Units into the skin daily. Use via insulin pump  ? Insulin  Syringes, Disposable, U-100 1 ML MISC 3x daily  ? losartan (COZAAR) 50 MG tablet TAKE 1 TABLET BY MOUTH ONCE DAILY  ? metoprolol succinate (TOPROL-XL) 100 MG 24 hr tablet TAKE 1 TABLET (100 MG TOTAL) BY MOUTH DAILY. TAKE WITH OR IMMEDIATELY FOLLOWING A MEAL.  ? rizatriptan (MAXALT-MLT) 10 MG disintegrating tablet Take 1 tablet earliest onset of headache.  May repeat after 2 hours if needed. Maximum 2 tablets in 24 hours  ? Vitamin D, Ergocalciferol, (DRISDOL) 1.25 MG (50000 UNIT) CAPS capsule Take 1 capsule (50,000 Units total) by mouth every 7 (seven) days.  ? diazepam (VALIUM) 10 MG tablet Take 1 tablet 30 to 40 minutes prior to MRI.  May take 1 tablet just before MRI if needed. (Patient not taking: Reported on 04/23/2021)  ? ondansetron (ZOFRAN-ODT) 4 MG disintegrating tablet Take 1 tablet (4 mg total) by mouth every 6 (six) hours as needed for nausea or vomiting. (Patient not taking: Reported on 04/23/2021)  ? [DISCONTINUED] ciprofloxacin-dexamethasone (CIPRODEX) OTIC suspension Place 4 drops into the right ear 2 (two) times daily. (Patient not taking: Reported on 04/23/2021)  ? ?No facility-administered medications prior to visit.  ? ? ?Review of Systems ?Review of Systems:  ?A fourteen system review of systems was performed and found to be positive as per HPI. ? ? ? ?  Objective  ?  ?BP 137/87   Pulse 82   Temp 98.1 ?F (36.7 ?C)   Ht 5' 2" (1.575 m)   Wt (!) 312 lb (141.5 kg)   SpO2 99%   BMI 57.07 kg/m?  ?BP  Readings from Last 3 Encounters:  ?04/23/21 137/87  ?03/15/21 106/70  ?02/10/21 125/71  ? ?Wt Readings from Last 3 Encounters:  ?04/23/21 (!) 312 lb (141.5 kg)  ?04/05/21 (!) 321 lb 14.4 oz (146 kg)  ?03/15/21 (!) 328 lb 1.9 oz (148.8 kg)  ? ? ?Physical Exam  ?General:  Well Developed, well nourished, answers questions appropriately  ?Neuro:  Alert and oriented,  extra-ocular muscles intact  ?HEENT:  Normocephalic, atraumatic, neck supple  ?Skin:  no gross rash, warm, pink. ?Cardiac:  RRR, S1  S2 ?Respiratory: CTA B/L w/o wheezing. ?Vascular:  Ext warm, no cyanosis apprec.; cap RF less 2 sec. ?Psych:  No HI/SI, judgement and insight good, Euthymic mood. Full Affect. ? ? ?Results for orders placed or performed in visit on 04/23/21  ?POCT glycosylated hemoglobin (Hb A1C)  ?Result Value Ref Range  ? Hemoglobin A1C 7.0 (A) 4.0 - 5.6 %  ? HbA1c POC (<> result, manual entry)    ? HbA1c, POC (prediabetic range)    ? HbA1c, POC (controlled diabetic range)    ? ? Assessment & Plan  ?  ? ? ?Problem List Items Addressed This Visit   ? ?  ? Cardiovascular and Mediastinum  ? Hypertension associated with diabetes ([place]) (Chronic)  ?  -BP has been better controlled since his weight loss journey and s/p gastric bypass. Will continue Losartan 50 mg daily, Metoprolol succinate 100 mg daily, and amlodipine 5 mg daily. Discussed with patient as he continues with weight loss efforts and if BP continues to improve and remain well controlled, then I will consider medication adjustments. Pt verbalized understanding. Advised to schedule lab visit for fasting blood-work including CMP for medication monitoring.  ? ?  ?  ? Relevant Orders  ? POCT glycosylated hemoglobin (Hb A1C) (Completed)  ?  ? Endocrine  ? Mixed diabetic hyperlipidemia associated with type 1 diabetes mellitus ([facility]) (Chronic)  ?  -Hx of statin intolerance. Last lipid panel: HDL 25, LDL 102 ?-Will repeat lipid panel with lab visit. ?-Discussed heart healthy diet, continue to follow-up with nutritionist.  ?-Will continue to monitor. ? ?  ?  ? Type I diabetes mellitus ([place]) - Primary  ?  -Followed by Endocrinology. A1c has improved from 7.6 to 7.0.  ?-Follow diabetes nutritionist recommendations to avoid low blood sugars. ?-Discussed with patient as he continues to lose weight his insulin pump settings will likely need to be adjusted as well. Recommend to f/up with Dr. Hartford Poli. ? ?  ?  ? ?Other Visit Diagnoses   ? ? Morbid obesity ([place])      ? ?  ? ?Morbid  obesity: ?-Associated with hypertension, hyperlipidemia and T1DM. ?-Patient is s/p gastric bypass sx. Pt has lost 69 pounds since last visit in 12/22/2020. Encourage to continue nutritionist sessions, diet and lifestyle changes.  ? ? ?Return in about 4 months (around 08/23/2021) for DM, HTN, HLD; lab visit for FBW (not A1c) next week.  ?   ? ? ? ?Lorrene Reid, PA-C  ?[facility] ?[phone] (phone) ?[phone] (fax) ? ?[facility] ?

## 2021-04-23 ENCOUNTER — Ambulatory Visit (INDEPENDENT_AMBULATORY_CARE_PROVIDER_SITE_OTHER): Payer: BC Managed Care – PPO | Admitting: Physician Assistant

## 2021-04-23 ENCOUNTER — Other Ambulatory Visit: Payer: Self-pay | Admitting: Physician Assistant

## 2021-04-23 ENCOUNTER — Encounter: Payer: Self-pay | Admitting: Physician Assistant

## 2021-04-23 VITALS — BP 137/87 | HR 82 | Temp 98.1°F | Ht 62.0 in | Wt 312.0 lb

## 2021-04-23 DIAGNOSIS — E1069 Type 1 diabetes mellitus with other specified complication: Secondary | ICD-10-CM

## 2021-04-23 DIAGNOSIS — E1065 Type 1 diabetes mellitus with hyperglycemia: Secondary | ICD-10-CM

## 2021-04-23 DIAGNOSIS — I152 Hypertension secondary to endocrine disorders: Secondary | ICD-10-CM

## 2021-04-23 DIAGNOSIS — E1159 Type 2 diabetes mellitus with other circulatory complications: Secondary | ICD-10-CM

## 2021-04-23 DIAGNOSIS — E782 Mixed hyperlipidemia: Secondary | ICD-10-CM

## 2021-04-23 LAB — POCT GLYCOSYLATED HEMOGLOBIN (HGB A1C): Hemoglobin A1C: 7 % — AB (ref 4.0–5.6)

## 2021-04-23 NOTE — Patient Instructions (Signed)
Managing Your Hypertension Hypertension, also called high blood pressure, is when the force of the blood pressing against the walls of the arteries is too strong. Arteries are blood vessels that carry blood from your heart throughout your body. Hypertension forces the heart to work harder to pump blood and may cause the arteries to become narrow or stiff. Understanding blood pressure readings A blood pressure reading includes a higher number over a lower number: The first, or top, number is called the systolic pressure. It is a measure of the pressure in your arteries as your heart beats. The second, or bottom number, is called the diastolic pressure. It is a measure of the pressure in your arteries as the heart relaxes. For most people, a normal blood pressure is below 120/80. Your personal target blood pressure may vary depending on your medical conditions, your age, and other factors. Blood pressure is classified into four stages. Based on your blood pressure reading, your health care provider may use the following stages to determine what type of treatment you need, if any. Systolic pressure and diastolic pressure are measured in a unit called millimeters of mercury (mmHg). Normal Systolic pressure: below 120. Diastolic pressure: below 80. Elevated Systolic pressure: 120-129. Diastolic pressure: below 80. Hypertension stage 1 Systolic pressure: 130-139. Diastolic pressure: 80-89. Hypertension stage 2 Systolic pressure: 140 or above. Diastolic pressure: 90 or above. How can this condition affect me? Managing your hypertension is very important. Over time, hypertension can damage the arteries and decrease blood flow to parts of the body, including the brain, heart, and kidneys. Having untreated or uncontrolled hypertension can lead to: A heart attack. A stroke. A weakened blood vessel (aneurysm). Heart failure. Kidney damage. Eye damage. Memory and concentration problems. Vascular  dementia. What actions can I take to manage this condition? Hypertension can be managed by making lifestyle changes and possibly by taking medicines. Your health care provider will help you make a plan to bring your blood pressure within a normal range. You may be referred for counseling on a healthy diet and physical activity. Nutrition  Eat a diet that is high in fiber and potassium, and low in salt (sodium), added sugar, and fat. An example eating plan is called the DASH diet. DASH stands for Dietary Approaches to Stop Hypertension. To eat this way: Eat plenty of fresh fruits and vegetables. Try to fill one-half of your plate at each meal with fruits and vegetables. Eat whole grains, such as whole-wheat pasta, brown rice, or whole-grain bread. Fill about one-fourth of your plate with whole grains. Eat low-fat dairy products. Avoid fatty cuts of meat, processed or cured meats, and poultry with skin. Fill about one-fourth of your plate with lean proteins such as fish, chicken without skin, beans, eggs, and tofu. Avoid pre-made and processed foods. These tend to be higher in sodium, added sugar, and fat. Reduce your daily sodium intake. Many people with hypertension should eat less than 1,500 mg of sodium a day. Lifestyle  Work with your health care provider to maintain a healthy body weight or to lose weight. Ask what an ideal weight is for you. Get at least 30 minutes of exercise that causes your heart to beat faster (aerobic exercise) most days of the week. Activities may include walking, swimming, or biking. Include exercise to strengthen your muscles (resistance exercise), such as weight lifting, as part of your weekly exercise routine. Try to do these types of exercises for 30 minutes at least 3 days a week. Do   not use any products that contain nicotine or tobacco. These products include cigarettes, chewing tobacco, and vaping devices, such as e-cigarettes. If you need help quitting, ask your  health care provider. Control any long-term (chronic) conditions you have, such as high cholesterol or diabetes. Identify your sources of stress and find ways to manage stress. This may include meditation, deep breathing, or making time for fun activities. Alcohol use Do not drink alcohol if: Your health care provider tells you not to drink. You are pregnant, may be pregnant, or are planning to become pregnant. If you drink alcohol: Limit how much you have to: 0-1 drink a day for women. 0-2 drinks a day for men. Know how much alcohol is in your drink. In the [place] one drink equals one 12 oz bottle of beer (355 mL), one 5 oz glass of wine (148 mL), or one 1 oz glass of hard liquor (44 mL). Medicines Your health care provider may prescribe medicine if lifestyle changes are not enough to get your blood pressure under control and if: Your systolic blood pressure is 130 or higher. Your diastolic blood pressure is 80 or higher. Take medicines only as told by your health care provider. Follow the directions carefully. Blood pressure medicines must be taken as told by your health care provider. The medicine does not work as well when you skip doses. Skipping doses also puts you at risk for problems. Monitoring Before you monitor your blood pressure: Do not smoke, drink caffeinated beverages, or exercise within 30 minutes before taking a measurement. Use the bathroom and empty your bladder (urinate). Sit quietly for at least 5 minutes before taking measurements. Monitor your blood pressure at home as told by your health care provider. To do this: Sit with your back straight and supported. Place your feet flat on the floor. Do not cross your legs. Support your arm on a flat surface, such as a table. Make sure your upper arm is at heart level. Each time you measure, take two or three readings one minute apart and record the results. You may also need to have your blood pressure checked regularly by  your health care provider. General information Talk with your health care provider about your diet, exercise habits, and other lifestyle factors that may be contributing to hypertension. Review all the medicines you take with your health care provider because there may be side effects or interactions. Keep all follow-up visits. Your health care provider can help you create and adjust your plan for managing your high blood pressure. Where to find more information National Heart, Lung, and Blood Institute: www.nhlbi.nih.gov American Heart Association: www.heart.org Contact a health care provider if: You think you are having a reaction to medicines you have taken. You have repeated (recurrent) headaches. You feel dizzy. You have swelling in your ankles. You have trouble with your vision. Get help right away if: You develop a severe headache or confusion. You have unusual weakness or numbness, or you feel faint. You have severe pain in your chest or abdomen. You vomit repeatedly. You have trouble breathing. These symptoms may be an emergency. Get help right away. Call 911. Do not wait to see if the symptoms will go away. Do not drive yourself to the hospital. Summary Hypertension is when the force of blood pumping through your arteries is too strong. If this condition is not controlled, it may put you at risk for serious complications. Your personal target blood pressure may vary depending on your medical conditions,   your age, and other factors. For most people, a normal blood pressure is less than 120/80. Hypertension is managed by lifestyle changes, medicines, or both. Lifestyle changes to help manage hypertension include losing weight, eating a healthy, low-sodium diet, exercising more, stopping smoking, and limiting alcohol. This information is not intended to replace advice given to you by your health care provider. Make sure you discuss any questions you have with your health care  provider. Document Revised: 09/10/2020 Document Reviewed: 09/10/2020 Elsevier Patient Education  2023 [employer]vier Inc.  

## 2021-04-23 NOTE — Assessment & Plan Note (Signed)
-  BP has been better controlled since his weight loss journey and s/p gastric bypass. Will continue Losartan 50 mg daily, Metoprolol succinate 100 mg daily, and amlodipine 5 mg daily. Discussed with patient as he continues with weight loss efforts and if BP continues to improve and remain well controlled, then I will consider medication adjustments. Pt verbalized understanding. Advised to schedule lab visit for fasting blood-work including CMP for medication monitoring.  ?

## 2021-04-23 NOTE — Assessment & Plan Note (Addendum)
-  Followed by Endocrinology. A1c has improved from 7.6 to 7.0.  ?-Follow diabetes nutritionist recommendations to avoid low blood sugars. ?-Discussed with patient as he continues to lose weight his insulin pump settings will likely need to be adjusted as well. Recommend to f/up with Dr. Shawnee Knapp. ?

## 2021-04-23 NOTE — Assessment & Plan Note (Signed)
-  Hx of statin intolerance. Last lipid panel: HDL 25, LDL 102 ?-Will repeat lipid panel with lab visit. ?-Discussed heart healthy diet, continue to follow-up with nutritionist.  ?-Will continue to monitor. ?

## 2021-04-26 ENCOUNTER — Other Ambulatory Visit: Payer: BC Managed Care – PPO

## 2021-04-26 DIAGNOSIS — E1069 Type 1 diabetes mellitus with other specified complication: Secondary | ICD-10-CM

## 2021-04-26 DIAGNOSIS — E1159 Type 2 diabetes mellitus with other circulatory complications: Secondary | ICD-10-CM | POA: Diagnosis not present

## 2021-04-26 DIAGNOSIS — Z130 Encounter for screening for diseases of the blood and blood-forming organs and certain disorders involving the immune mechanism: Secondary | ICD-10-CM

## 2021-04-26 DIAGNOSIS — I152 Hypertension secondary to endocrine disorders: Secondary | ICD-10-CM | POA: Diagnosis not present

## 2021-04-26 DIAGNOSIS — E1065 Type 1 diabetes mellitus with hyperglycemia: Secondary | ICD-10-CM

## 2021-04-27 ENCOUNTER — Telehealth: Payer: Self-pay | Admitting: Physician Assistant

## 2021-04-27 LAB — COMPREHENSIVE METABOLIC PANEL
ALT: 8 IU/L (ref 0–44)
AST: 12 IU/L (ref 0–40)
Albumin/Globulin Ratio: 1.6 (ref 1.2–2.2)
Albumin: 3.9 g/dL — ABNORMAL LOW (ref 4.0–5.0)
Alkaline Phosphatase: 69 IU/L (ref 44–121)
BUN/Creatinine Ratio: 10 (ref 9–20)
BUN: 8 mg/dL (ref 6–24)
Bilirubin Total: 0.5 mg/dL (ref 0.0–1.2)
CO2: 23 mmol/L (ref 20–29)
Calcium: 9.3 mg/dL (ref 8.7–10.2)
Chloride: 102 mmol/L (ref 96–106)
Creatinine, Ser: 0.82 mg/dL (ref 0.76–1.27)
Globulin, Total: 2.5 g/dL (ref 1.5–4.5)
Glucose: 175 mg/dL — ABNORMAL HIGH (ref 70–99)
Potassium: 4.8 mmol/L (ref 3.5–5.2)
Sodium: 139 mmol/L (ref 134–144)
Total Protein: 6.4 g/dL (ref 6.0–8.5)
eGFR: 112 mL/min/{1.73_m2} (ref >59)

## 2021-04-27 LAB — LIPID PANEL
Chol/HDL Ratio: 5 ratio (ref 0.0–5.0)
Cholesterol, Total: 186 mg/dL (ref 100–199)
HDL: 37 mg/dL — ABNORMAL LOW (ref >39)
LDL Chol Calc (NIH): 130 mg/dL — ABNORMAL HIGH (ref 0–99)
Triglycerides: 104 mg/dL (ref 0–149)
VLDL Cholesterol Cal: 19 mg/dL (ref 5–40)

## 2021-04-27 LAB — CBC WITH DIFFERENTIAL/PLATELET
Basophils Absolute: 0 10*3/uL (ref 0.0–0.2)
Basos: 0 %
EOS (ABSOLUTE): 0.1 10*3/uL (ref 0.0–0.4)
Eos: 3 %
Hematocrit: 39.8 % (ref 37.5–51.0)
Hemoglobin: 12.9 g/dL — ABNORMAL LOW (ref 13.0–17.7)
Immature Grans (Abs): 0 10*3/uL (ref 0.0–0.1)
Immature Granulocytes: 0 %
Lymphocytes Absolute: 1 10*3/uL (ref 0.7–3.1)
Lymphs: 19 %
MCH: 29.3 pg (ref 26.6–33.0)
MCHC: 32.4 g/dL (ref 31.5–35.7)
MCV: 90 fL (ref 79–97)
Monocytes Absolute: 0.5 10*3/uL (ref 0.1–0.9)
Monocytes: 10 %
Neutrophils Absolute: 3.5 10*3/uL (ref 1.4–7.0)
Neutrophils: 68 %
Platelets: 225 10*3/uL (ref 150–450)
RBC: 4.41 x10E6/uL (ref 4.14–5.80)
RDW: 11.8 % (ref 11.6–15.4)
WBC: 5.2 10*3/uL (ref 3.4–10.8)

## 2021-04-27 LAB — TSH: TSH: 1.31 u[IU]/mL (ref 0.450–4.500)

## 2021-04-27 NOTE — Telephone Encounter (Signed)
Patient called with results.

## 2021-04-27 NOTE — Telephone Encounter (Signed)
Patient left a vm stating he got test results on Mychart however he doesn't know what it means. Please call him. [phone] ?

## 2021-04-30 ENCOUNTER — Ambulatory Visit
Admission: RE | Admit: 2021-04-30 | Discharge: 2021-04-30 | Disposition: A | Discharge status: Home / Self Care | Payer: BC Managed Care – PPO | Source: Ambulatory Visit | Attending: Physician Assistant | Admitting: Physician Assistant

## 2021-04-30 ENCOUNTER — Ambulatory Visit (INDEPENDENT_AMBULATORY_CARE_PROVIDER_SITE_OTHER): Payer: BC Managed Care – PPO | Admitting: Physician Assistant

## 2021-04-30 ENCOUNTER — Encounter: Payer: Self-pay | Admitting: Physician Assistant

## 2021-04-30 VITALS — BP 124/78 | HR 88 | Temp 97.8°F | Ht 72.0 in | Wt 318.0 lb

## 2021-04-30 DIAGNOSIS — R1903 Right lower quadrant abdominal swelling, mass and lump: Secondary | ICD-10-CM | POA: Diagnosis not present

## 2021-04-30 DIAGNOSIS — M25561 Pain in right knee: Secondary | ICD-10-CM | POA: Diagnosis not present

## 2021-04-30 NOTE — Progress Notes (Signed)
?Established patient acute visit ? ? ?Patient: Michael Hutchinson   DOB: 1975-08-05   46 y.o. Male  MRN: [id number] ?Visit Date: 04/30/2021 ? ?Chief Complaint  ?Patient presents with  ? Acute Visit  ? ?Subjective  ?  ?HPI  ?Patient presents with c/o lump of lower abdomen. Reports noticed the lump started hurting yesterday. Currently rates pain 4-5/10 but yesterday it was 8-9/10. Describes pain as a sharp-stabbing pain. No fever or skin discoloration. States was working on wrenches when the pain started.  ? ?Medications: ?Outpatient Medications Prior to Visit  ?Medication Sig  ? albuterol (VENTOLIN HFA) 108 (90 Base) MCG/ACT inhaler INHALE 1 TO 2 PUFFS INTO THE LUNGS EVERY 4 HOURS AS NEEDED FOR WHEEZING OR SHORTNESS OF BREATH.  ? amLODipine (NORVASC) 5 MG tablet Take 1 tablet (5 mg total) by mouth daily.  ? BAYER MICROLET LANCETS lancets Use as instructed to check 3 times daily.  ? Blood Glucose Monitoring Suppl (ONE TOUCH ULTRA SYSTEM KIT) w/Device KIT 1 kit by Does not apply route once. Check blood sugar 3 times daily. Diagnosis Diabetes ICD-10 E11.8  ? Continuous Blood Gluc Receiver ([place]) DEVI See admin instructions.  ? Continuous Blood Gluc Sensor (DEXCOM G6 SENSOR) MISC SMARTSIG:1 Topical Every 10 Days  ? Continuous Blood Gluc Transmit (DEXCOM G6 TRANSMITTER) MISC DISPENSE AND USE AS DIRECTED  ? diazepam (VALIUM) 10 MG tablet Take 1 tablet 30 to 40 minutes prior to MRI.  May take 1 tablet just before MRI if needed.  ? glucose blood (BAYER CONTOUR NEXT TEST) test strip 1 each by Other route 4 (four) times daily. And lancets 4/day  ? HYDROcodone-acetaminophen (NORCO/VICODIN) 5-325 MG tablet Take 1 tablet by mouth every 4 (four) hours as needed.  ? insulin lispro (HUMALOG) 100 UNIT/ML injection Inject 60-70 Units into the skin daily. Use via insulin pump  ? Insulin Syringes, Disposable, U-100 1 ML MISC 3x daily  ? losartan (COZAAR) 50 MG tablet TAKE 1 TABLET BY MOUTH ONCE DAILY  ? metoprolol succinate  (TOPROL-XL) 100 MG 24 hr tablet TAKE 1 TABLET (100 MG TOTAL) BY MOUTH DAILY. TAKE WITH OR IMMEDIATELY FOLLOWING A MEAL.  ? ondansetron (ZOFRAN-ODT) 4 MG disintegrating tablet Take 1 tablet (4 mg total) by mouth every 6 (six) hours as needed for nausea or vomiting.  ? rizatriptan (MAXALT-MLT) 10 MG disintegrating tablet Take 1 tablet earliest onset of headache.  May repeat after 2 hours if needed. Maximum 2 tablets in 24 hours  ? Vitamin D, Ergocalciferol, (DRISDOL) 1.25 MG (50000 UNIT) CAPS capsule Take 1 capsule (50,000 Units total) by mouth every 7 (seven) days.  ? ?No facility-administered medications prior to visit.  ? ? ?Review of Systems ?Review of Systems:  ?A fourteen system review of systems was performed and found to be positive as per HPI. ? ? ?  Objective  ?  ?BP 124/78   Pulse 88   Temp 97.8 ?F (36.6 ?C)   Ht 6' (1.829 m)   Wt (!) 318 lb (144.2 kg)   SpO2 98%   BMI 43.13 kg/m?  ? ? ?Physical Exam  ?General:  Well Developed, well nourished, appropriate for stated age.  ?Neuro:  Alert and oriented,  extra-ocular muscles intact  ?HEENT:  Normocephalic, atraumatic, neck supple  ?Abdomen: tender mass to palpation of right lower abdomen which is more prominent with standing position, non-reducible  ?Skin:  no gross rash, warm, pink. ?Cardiac:  RRR, S1 S2 ?Respiratory: CTA B/L  ?Vascular:  Ext warm, no  cyanosis apprec.; cap RF less 2 sec. ?Psych:  No HI/SI, judgement and insight good, Euthymic mood. Full Affect. ? ? ?No results found for any visits on 04/30/21. ? Assessment & Plan  ?  ? ?Patient presenting with right lower abdominal mass suspicious for ventral hernia. Will place order for stat ultrasound for further evaluation. Discussed red flag s/sx to monitor for and should seek immediate medical care.  ? ? ?No follow-ups on file.  ?   ? ? ? ?Lorrene Reid, PA-C  ?[facility] ?[phone] (phone) ?[phone] (fax) ? ?[facility] ?

## 2021-04-30 NOTE — Patient Instructions (Signed)

## 2021-05-03 ENCOUNTER — Ambulatory Visit: Payer: BC Managed Care – PPO | Admitting: Physician Assistant

## 2021-05-07 DIAGNOSIS — S83411A Sprain of medial collateral ligament of right knee, initial encounter: Secondary | ICD-10-CM | POA: Diagnosis not present

## 2021-05-07 DIAGNOSIS — S83419A Sprain of medial collateral ligament of unspecified knee, initial encounter: Secondary | ICD-10-CM | POA: Insufficient documentation

## 2021-05-11 ENCOUNTER — Other Ambulatory Visit: Payer: Self-pay | Admitting: Physician Assistant

## 2021-05-11 ENCOUNTER — Telehealth: Payer: Self-pay | Admitting: Physician Assistant

## 2021-05-11 DIAGNOSIS — K429 Umbilical hernia without obstruction or gangrene: Secondary | ICD-10-CM

## 2021-05-11 NOTE — Telephone Encounter (Signed)
Patient is aware and given information. ?

## 2021-05-11 NOTE — Telephone Encounter (Signed)
Patient called stating he was waiting on a call from a surgeon for his hernia but has not heard anything and is asking if a referral was sent over? Please advise.  ?

## 2021-05-11 NOTE — Telephone Encounter (Signed)
Referral to [facility] has been placed. Please give patient the information so they can contact that office to schedule. AS, CMA ?

## 2021-05-12 DIAGNOSIS — M25561 Pain in right knee: Secondary | ICD-10-CM | POA: Diagnosis not present

## 2021-05-18 DIAGNOSIS — Z794 Long term (current) use of insulin: Secondary | ICD-10-CM | POA: Diagnosis not present

## 2021-05-19 ENCOUNTER — Ambulatory Visit: Payer: BC Managed Care – PPO | Admitting: Surgery

## 2021-05-19 DIAGNOSIS — R1909 Other intra-abdominal and pelvic swelling, mass and lump: Secondary | ICD-10-CM | POA: Diagnosis not present

## 2021-05-19 DIAGNOSIS — Z9884 Bariatric surgery status: Secondary | ICD-10-CM | POA: Diagnosis not present

## 2021-05-28 ENCOUNTER — Other Ambulatory Visit: Payer: Self-pay | Admitting: Surgery

## 2021-05-28 ENCOUNTER — Other Ambulatory Visit (HOSPITAL_COMMUNITY): Payer: Self-pay | Admitting: Surgery

## 2021-05-28 DIAGNOSIS — R1032 Left lower quadrant pain: Secondary | ICD-10-CM

## 2021-06-04 ENCOUNTER — Ambulatory Visit (HOSPITAL_COMMUNITY): Payer: BC Managed Care – PPO

## 2021-06-14 ENCOUNTER — Encounter (HOSPITAL_COMMUNITY): Payer: Self-pay

## 2021-06-14 ENCOUNTER — Ambulatory Visit (HOSPITAL_COMMUNITY)
Admission: RE | Admit: 2021-06-14 | Discharge: 2021-06-14 | Disposition: A | Discharge status: Home / Self Care | Payer: BC Managed Care – PPO | Source: Ambulatory Visit | Attending: Surgery | Admitting: Surgery

## 2021-06-14 DIAGNOSIS — R1032 Left lower quadrant pain: Secondary | ICD-10-CM

## 2021-06-16 DIAGNOSIS — E1059 Type 1 diabetes mellitus with other circulatory complications: Secondary | ICD-10-CM | POA: Diagnosis not present

## 2021-06-16 DIAGNOSIS — E1069 Type 1 diabetes mellitus with other specified complication: Secondary | ICD-10-CM | POA: Diagnosis not present

## 2021-06-16 DIAGNOSIS — E785 Hyperlipidemia, unspecified: Secondary | ICD-10-CM | POA: Diagnosis not present

## 2021-06-16 DIAGNOSIS — I152 Hypertension secondary to endocrine disorders: Secondary | ICD-10-CM | POA: Diagnosis not present

## 2021-07-01 ENCOUNTER — Telehealth: Payer: Self-pay | Admitting: Physician Assistant

## 2021-07-01 NOTE — Telephone Encounter (Signed)
Patient stating he is having a CT with contrast done tomorrow and was told to call his PCP for labs. I'm assuming bun and creatinine but they didn't tell him. Can you please advise.

## 2021-07-02 ENCOUNTER — Ambulatory Visit (HOSPITAL_COMMUNITY): Payer: BC Managed Care – PPO

## 2021-07-06 ENCOUNTER — Other Ambulatory Visit: Payer: Self-pay | Admitting: Physician Assistant

## 2021-07-06 DIAGNOSIS — E1159 Type 2 diabetes mellitus with other circulatory complications: Secondary | ICD-10-CM

## 2021-07-07 NOTE — Telephone Encounter (Signed)
Patient aware.

## 2021-07-14 ENCOUNTER — Ambulatory Visit (HOSPITAL_COMMUNITY): Admission: RE | Admit: 2021-07-14 | Payer: BC Managed Care – PPO | Source: Ambulatory Visit

## 2021-07-28 ENCOUNTER — Other Ambulatory Visit: Payer: Self-pay | Admitting: Physician Assistant

## 2021-07-28 DIAGNOSIS — I152 Hypertension secondary to endocrine disorders: Secondary | ICD-10-CM

## 2021-08-06 ENCOUNTER — Telehealth: Payer: Self-pay

## 2021-08-06 NOTE — Telephone Encounter (Signed)
PA needed for Ocrevus

## 2021-08-09 NOTE — Telephone Encounter (Signed)
Submitted a Prior Authorization request to [facility] for  Ocrevus  via CoverMyMeds. Will update once we receive a response.   Key: [id number]

## 2021-08-10 DIAGNOSIS — Z794 Long term (current) use of insulin: Secondary | ICD-10-CM | POA: Diagnosis not present

## 2021-08-11 NOTE — Telephone Encounter (Signed)
Patient Advocate Encounter  Prior Authorization for Ocrevus 300MG /10ML solution has been approved.     Effective dates: 08/09/2021 through 08/08/2022      08/10/2022, CPhT Pharmacy Patient Advocate Specialist [facility] Patient Advocate Team Direct Number: [phone]  Fax: [phone]

## 2021-08-17 NOTE — Progress Notes (Signed)
[facility] infusion form filled out for ocrevus, Waiting on signature form dr.Jaffe. He is out of the office for the last week.

## 2021-08-24 NOTE — Progress Notes (Deleted)
NEUROLOGY FOLLOW UP OFFICE NOTE  KEYONDRE HEPBURN [id number]  Assessment/Plan:   1  Multiple sclerosis:  While ischemic cranial nerve palsies are seen in uncontrolled diabetes, given the 3 distinct episodes of various cranial nerve palsies, the appearance of white matter lesions on brain MRI, and CSF analysis demonstrated for oligoclonal bands not present in serum, I suspect MS.  He does have significantly elevated CSF protein but does not exhibit any sign of infection.  Diabetes may be contributor to this. 2  Possible episodic cluster headaches 3  Type 1 diabetes mellitus      DMT:  Ocrevus Check quantitative immunoglobulin panel and vit D today and in 6 months. Check MRI of brain with and without contrast - prescribed diazepam (aware that he needs a driver to and from the facility). For headache abortive therapy, will try rizatriptan $RemoveBeforeD'10mg'LOGkuzxkgHfCgU$ .   Refer to ophthalmology Follow up 6 months   Subjective:  Harl Favor is a 46 year old right-handed Caucasian man with type 1 diabetes with history of DKA, hypertension, Charcot's joint of foot, left below the knee amputation, hyperlipidemia and GERD who follows up for multiple sclerosis   UPDATE: Current disease modifying therapy: Ocrevus (status post 1 infusion - next infusion next month) Other current medications: D3 6000 IU daily   MRI of brain was ordered last visit but was not performed.  He was feeling anxiety.   Labs 02/10/2021:  IgA 354, IgG 1085, IgM 46; Vit D 42.82.  Endorsed that he hadn't been taking D3.  Advised to increase D3 to 6000 IU daily 04/26/2021:  CBC with WBC 5.2, HGB 12.9, HCT 39.8, PLT 225, ALC 1; CMP with Na 139, K 4.8, Cl 102, CO2 23, Ca 9.3, glucose 175, BUN 8, Cr 0.82, t bili 0.5, ALP 69, AST 12, ALT 8   Usually 2 weeks before his next Ocrevus infusion, he experiences eye pain.  He describes stabbing left eye pain associated with blurred vision, lacrimation, conjunctival injection, ptosis, photophobia,  phonopobia and sometimes nausea.  May last several hours and occur several times for that time period.        Vision:  OK Motor:  Sometimes notes tremors in either hand, primarily the right.   Sensory:  No numbness. Pain:  Pain in low back radiating down legs.  Hurts to stand. . Gait:  No change Bowel/Bladder:  No issues. Fatigue:  yes.    Sleep study negative for OSA.   Cognition:  no Mood:  No depression     HISTORY:  In October 2016, he developed a right 6th cranial nerve palsy which was thought to be due to his uncontrolled type 1 diabetes.   In March 2017, he developed blurred vision in the left eye.  He saw ophthalmologist Dr. Julian Reil who diagnosed optic neuritis.  MRI of the brain and orbits with and without contrast from 05/11/2015 revealed "2 T2 hyperintense lesions within the left hemisphere adjacent to the posterior horn of the left lateral ventricle ", a nonspecific finding and without abnormal contrast enhancement.   In March 2019, he developed disconjugate gaze and droopy left eyelid.  He saw the eye doctor who diagnosed a left 3rd nerve palsy.  He was evaluated in the ED at [facility] on 03/28/2017.  CTA of the head was negative for aneurysm.  MRI of the brain with and without contrast demonstrated multiple T2/flair hyperintense foci involving the periventricular, deep and subcortical white matter, progressed compared to prior MRI from 2017.  In-house neurology believed his findings were more likely due to a diabetic 3rd nerve palsy rather than MS.  He was advised to start aspirin 81 mg daily and follow-up with outpatient neurology.  He subsequently underwent a work-up for multiple sclerosis.  MRI of the cervical spine with and without contrast from 05/04/2017 revealed no cord lesions.  He underwent lumbar puncture on 05/19/2017 for CSF analysis which revealed elevated IgG index of 0.79 and 4 oligoclonal bands not present in the corresponding serum.  Cell count was 1,  elevated protein 157, elevated glucose 100, negative myelin basic protein, ACE 4, Gram stain and culture negative.   Past disease modifying therapy: Tecfidera (GI upset), Ocrevus (hives but declined premedicated Benadryl)   Imaging: 05/11/2015 MRI BRAIN W WO:  Two T2 hyperintense lesions within the left hemisphere adjacent to the posterior horn of the left lateral ventricle. The finding is nonspecific but can be seen in the setting of chronic microvascular ischemia, a demyelinating process such as multiple sclerosis, vasculitis, complicated migraine headaches, or as the sequelae of a prior infectious or inflammatory process. 05/11/2015 MRI ORBITS W WO:  Within normal limits 03/28/2017 MRI BRAIN & ORBITS W WO:  1. Multiple T2/FLAIR hyperintense foci involving the periventricular, deep, and subcortical white matter of both cerebral hemispheres in a distribution highly suspicious for possible demyelinating disease/multiple sclerosis. Changes have progressed relative to 2017. No evidence for active demyelination.  2. Negative MRI of the orbits. No findings to suggest acute optic neuritis or other abnormality identified. 3. Bilateral mastoid effusions, left greater than right. 05/04/2017 MRI CERVICAL SPINE W WO:  Normal 02/25/2018 MRI BRAIN W WO: No change since the study of March 2019. Multiple foci of abnormal T2 and FLAIR signal within the cerebral hemispheric deep and subcortical white matter, consistent with the clinical diagnosis of multiple sclerosis. No new or progressive lesions. No lesions show contrast enhancement or restricted diffusion.  PAST MEDICAL HISTORY: Past Medical History:  Diagnosis Date   Asthma    Charcot's joint of foot 11/91/4782   Complication of anesthesia    "I wake up angry" (12/31/2015)   Diabetes mellitus type 1 ([place]) dx'd 1981   Diabetic ketoacidosis ([place])    Essential hypertension 05/03/2013   GERD (gastroesophageal reflux disease)    High cholesterol    Hx MRSA  infection    Inner thigh and under arm- healed areas   Meniscus tear    Pneumonia    2016 ish    MEDICATIONS: Current Outpatient Medications on File Prior to Visit  Medication Sig Dispense Refill   albuterol (VENTOLIN HFA) 108 (90 Base) MCG/ACT inhaler INHALE 1 TO 2 PUFFS INTO THE LUNGS EVERY 4 HOURS AS NEEDED FOR WHEEZING OR SHORTNESS OF BREATH. 8.5 g 2   amLODipine (NORVASC) 5 MG tablet TAKE 1 TABLET BY MOUTH DAILY. 90 tablet 0   BAYER MICROLET LANCETS lancets Use as instructed to check 3 times daily. 100 each 12   Blood Glucose Monitoring Suppl (ONE TOUCH ULTRA SYSTEM KIT) w/Device KIT 1 kit by Does not apply route once. Check blood sugar 3 times daily. Diagnosis Diabetes ICD-10 E11.8     Continuous Blood Gluc Receiver ([place]) DEVI See admin instructions.     Continuous Blood Gluc Sensor (DEXCOM G6 SENSOR) MISC SMARTSIG:1 Topical Every 10 Days     Continuous Blood Gluc Transmit (DEXCOM G6 TRANSMITTER) MISC DISPENSE AND USE AS DIRECTED     diazepam (VALIUM) 10 MG tablet Take 1 tablet 30 to 40 minutes  prior to MRI.  May take 1 tablet just before MRI if needed. 2 tablet 0   glucose blood (BAYER CONTOUR NEXT TEST) test strip 1 each by Other route 4 (four) times daily. And lancets 4/day 400 each 3   HYDROcodone-acetaminophen (NORCO/VICODIN) 5-325 MG tablet Take 1 tablet by mouth every 4 (four) hours as needed.     insulin lispro (HUMALOG) 100 UNIT/ML injection Inject 60-70 Units into the skin daily. Use via insulin pump     Insulin Syringes, Disposable, U-100 1 ML MISC 3x daily 50 each 2   losartan (COZAAR) 50 MG tablet TAKE 1 TABLET BY MOUTH ONCE DAILY 90 tablet 1   metoprolol succinate (TOPROL-XL) 100 MG 24 hr tablet TAKE 1 TABLET (100 MG TOTAL) BY MOUTH DAILY. TAKE WITH OR IMMEDIATELY FOLLOWING A MEAL. 90 tablet 1   ondansetron (ZOFRAN-ODT) 4 MG disintegrating tablet Take 1 tablet (4 mg total) by mouth every 6 (six) hours as needed for nausea or vomiting. 20 tablet 0    rizatriptan (MAXALT-MLT) 10 MG disintegrating tablet Take 1 tablet earliest onset of headache.  May repeat after 2 hours if needed. Maximum 2 tablets in 24 hours 10 tablet 5   Vitamin D, Ergocalciferol, (DRISDOL) 1.25 MG (50000 UNIT) CAPS capsule Take 1 capsule (50,000 Units total) by mouth every 7 (seven) days. 12 capsule 0   No current facility-administered medications on file prior to visit.    ALLERGIES: Allergies  Allergen Reactions   Influenza Vac Split Quad Nausea And Vomiting   Influenza Virus Vaccine Split Nausea And Vomiting   Hydrochlorothiazide Other (See Comments)    Dizziness   Lisinopril Cough    FAMILY HISTORY: Family History  Problem Relation Age of Onset   Other Mother    Cancer Mother        Breast / Bone   Heart attack Father    Hypertension Father    Hyperlipidemia Father    Diabetes Other    Alcohol abuse Sister    Diabetes Maternal Grandfather    Stroke Paternal Grandmother    Alcohol abuse Paternal Grandfather       Objective:  *** General: No acute distress.  Patient appears ***-groomed.   Head:  Normocephalic/atraumatic Eyes:  Fundi examined but not visualized Neck: supple, no paraspinal tenderness, full range of motion Heart:  Regular rate and rhythm Lungs:  Clear to auscultation bilaterally Back: No paraspinal tenderness Neurological Exam: alert and oriented to person, place, and time.  Speech fluent and not dysarthric, language intact.  Asymmetric pupils (OD >OS), OS poorly reactive with clouding.  Otherwise, CN II-XII intact. Bulk and tone normal, muscle strength 5/5 throughout (left BTK amputation).  Sensation to vibration reduced in foot  Deep tendon reflexes absent throughout, toes downgoing.  Finger to nose testing intact.  Gait stable.   Metta Clines, DO  CC: Lorrene Reid, PA-C

## 2021-08-25 ENCOUNTER — Ambulatory Visit: Payer: BC Managed Care – PPO | Admitting: Physician Assistant

## 2021-08-25 ENCOUNTER — Ambulatory Visit: Payer: BC Managed Care – PPO | Admitting: Neurology

## 2021-08-25 DIAGNOSIS — G35 Multiple sclerosis: Secondary | ICD-10-CM | POA: Diagnosis not present

## 2021-08-26 ENCOUNTER — Telehealth: Payer: Self-pay | Admitting: Neurology

## 2021-08-26 NOTE — Telephone Encounter (Signed)
Per Michael Hutchinson from [place], patient had hives during Ocreavus infusion yesterday and was given oral and IV benadryl. He was able to complete the infusion.  Does Dr. Everlena Cooper to want to adjust orders going forward to include benadryl.

## 2021-08-27 ENCOUNTER — Encounter: Payer: Self-pay | Admitting: Physician Assistant

## 2021-08-27 ENCOUNTER — Ambulatory Visit (INDEPENDENT_AMBULATORY_CARE_PROVIDER_SITE_OTHER): Payer: BC Managed Care – PPO | Admitting: Physician Assistant

## 2021-08-27 VITALS — BP 138/81 | HR 59 | Temp 97.7°F | Ht 72.0 in | Wt 314.0 lb

## 2021-08-27 DIAGNOSIS — E782 Mixed hyperlipidemia: Secondary | ICD-10-CM

## 2021-08-27 DIAGNOSIS — E1159 Type 2 diabetes mellitus with other circulatory complications: Secondary | ICD-10-CM

## 2021-08-27 DIAGNOSIS — E1065 Type 1 diabetes mellitus with hyperglycemia: Secondary | ICD-10-CM

## 2021-08-27 DIAGNOSIS — E1069 Type 1 diabetes mellitus with other specified complication: Secondary | ICD-10-CM | POA: Diagnosis not present

## 2021-08-27 DIAGNOSIS — I152 Hypertension secondary to endocrine disorders: Secondary | ICD-10-CM | POA: Diagnosis not present

## 2021-08-27 LAB — POCT GLYCOSYLATED HEMOGLOBIN (HGB A1C): Hemoglobin A1C: 6.7 % — AB (ref 4.0–5.6)

## 2021-08-27 NOTE — Telephone Encounter (Signed)
Theodoro Grist called informed that Dr Elenora Gamma the premedication IV benadryl. Theodoro Grist is going to fax over an order for Dr Everlena Cooper to sign.

## 2021-08-27 NOTE — Assessment & Plan Note (Signed)
-  A1c today 6.7, increased from 6.2 but remains at goal. Continue to follow-up with endocrinology and current medication regimen.  Will continue to monitor.

## 2021-08-27 NOTE — Assessment & Plan Note (Signed)
-  Stable. -Continue current medication regimen.  -Will continue to monitor. 

## 2021-08-27 NOTE — Assessment & Plan Note (Signed)
-  Last lipid panel: HDL 37, LDL 130 -Advised patient to schedule lab visit for fasting labs to repeat lipid panel since starting Zetia 10 mg.  -Will continue to monitor.

## 2021-08-27 NOTE — Progress Notes (Signed)
Established patient visit   Patient: Michael Hutchinson   DOB: 26-Aug-1975   46 y.o. Male  MRN: [id number] Visit Date: 08/27/2021  Chief Complaint  Patient presents with   Diabetes   Subjective    HPI  Patient presents for chronic follow-up.  Diabetes mellitus: Followed by endocrinology. No increased urination or thirst. Pt reports medication compliance. 1-2 hypoglycemic events. Checking glucose at home. Patient reports average sugars from 120-180 with few 200s. Patient continues with small portions.   HTN: No chest pain, palpitations, dizziness or lower extremity swelling. Taking medication as directed without side effects.   HLD: Pt unable to tolerate statins. Started on Zetia 10 mg by endocrinology. Patient reports has been taking medication every other day because did not like how medication made him feel taking it daily.    Medications: Outpatient Medications Prior to Visit  Medication Sig   albuterol (VENTOLIN HFA) 108 (90 Base) MCG/ACT inhaler INHALE 1 TO 2 PUFFS INTO THE LUNGS EVERY 4 HOURS AS NEEDED FOR WHEEZING OR SHORTNESS OF BREATH.   amLODipine (NORVASC) 5 MG tablet TAKE 1 TABLET BY MOUTH DAILY.   BAYER MICROLET LANCETS lancets Use as instructed to check 3 times daily.   Blood Glucose Monitoring Suppl (ONE TOUCH ULTRA SYSTEM KIT) w/Device KIT 1 kit by Does not apply route once. Check blood sugar 3 times daily. Diagnosis Diabetes ICD-10 E11.8   Continuous Blood Gluc Receiver (DEXCOM G6 RECEIVER) DEVI See admin instructions.   Continuous Blood Gluc Sensor (DEXCOM G6 SENSOR) MISC SMARTSIG:1 Topical Every 10 Days   Continuous Blood Gluc Transmit (DEXCOM G6 TRANSMITTER) MISC DISPENSE AND USE AS DIRECTED   diazepam (VALIUM) 10 MG tablet Take 1 tablet 30 to 40 minutes prior to MRI.  May take 1 tablet just before MRI if needed.   glucose blood (BAYER CONTOUR NEXT TEST) test strip 1 each by Other route 4 (four) times daily. And lancets 4/day   HYDROcodone-acetaminophen  (NORCO/VICODIN) 5-325 MG tablet Take 1 tablet by mouth every 4 (four) hours as needed.   insulin lispro (HUMALOG) 100 UNIT/ML injection Inject 60-70 Units into the skin daily. Use via insulin pump   Insulin Syringes, Disposable, U-100 1 ML MISC 3x daily   losartan (COZAAR) 50 MG tablet TAKE 1 TABLET BY MOUTH ONCE DAILY   metoprolol succinate (TOPROL-XL) 100 MG 24 hr tablet TAKE 1 TABLET (100 MG TOTAL) BY MOUTH DAILY. TAKE WITH OR IMMEDIATELY FOLLOWING A MEAL.   ondansetron (ZOFRAN-ODT) 4 MG disintegrating tablet Take 1 tablet (4 mg total) by mouth every 6 (six) hours as needed for nausea or vomiting.   rizatriptan (MAXALT-MLT) 10 MG disintegrating tablet Take 1 tablet earliest onset of headache.  May repeat after 2 hours if needed. Maximum 2 tablets in 24 hours   Vitamin D, Ergocalciferol, (DRISDOL) 1.25 MG (50000 UNIT) CAPS capsule Take 1 capsule (50,000 Units total) by mouth every 7 (seven) days.   No facility-administered medications prior to visit.    Review of Systems Review of Systems:  A fourteen system review of systems was performed and found to be positive as per HPI.  Last CBC Lab Results  Component Value Date   WBC 5.2 04/26/2021   HGB 12.9 (L) 04/26/2021   HCT 39.8 04/26/2021   MCV 90 04/26/2021   MCH 29.3 04/26/2021   RDW 11.8 04/26/2021   PLT 225 04/26/2021   Last metabolic panel Lab Results  Component Value Date   GLUCOSE 175 (H) 04/26/2021   NA 139 04/26/2021  K 4.8 04/26/2021   CL 102 04/26/2021   CO2 23 04/26/2021   BUN 8 04/26/2021   CREATININE 0.82 04/26/2021   EGFR 112 04/26/2021   CALCIUM 9.3 04/26/2021   PROT 6.4 04/26/2021   ALBUMIN 3.9 (L) 04/26/2021   LABGLOB 2.5 04/26/2021   AGRATIO 1.6 04/26/2021   BILITOT 0.5 04/26/2021   ALKPHOS 69 04/26/2021   AST 12 04/26/2021   ALT 8 04/26/2021   ANIONGAP 13 01/12/2021   Last lipids Lab Results  Component Value Date   CHOL 186 04/26/2021   HDL 37 (L) 04/26/2021   LDLCALC 130 (H) 04/26/2021    TRIG 104 04/26/2021   CHOLHDL 5.0 04/26/2021   Last hemoglobin A1c Lab Results  Component Value Date   HGBA1C 7.0 (A) 04/23/2021   Last thyroid functions Lab Results  Component Value Date   TSH 1.310 04/26/2021   T3TOTAL 112 05/01/2020   Last vitamin D Lab Results  Component Value Date   VD25OH 42.82 02/10/2021     Objective    BP 138/81   Pulse (!) 59   Temp 97.7 F (36.5 C)   Ht 6' (1.829 m)   Wt (!) 314 lb (142.4 kg)   SpO2 100%   BMI 42.59 kg/m  BP Readings from Last 3 Encounters:  08/27/21 138/81  04/30/21 124/78  04/23/21 137/87   Wt Readings from Last 3 Encounters:  08/27/21 (!) 314 lb (142.4 kg)  04/30/21 (!) 318 lb (144.2 kg)  04/23/21 (!) 312 lb (141.5 kg)    Physical Exam  General:  Pleasant and cooperative, appropriate for stated age.  Neuro:  Alert and oriented,  extra-ocular muscles intact  HEENT:  Normocephalic, atraumatic, neck supple  Skin:  no gross rash, warm, pink. Cardiac:  RRR, S1 S2 Respiratory: CTA B/L  Vascular:  Ext warm, no cyanosis apprec.; prosthesis (L) Psych:  No HI/SI, judgement and insight good, Euthymic mood. Full Affect.   No results found for any visits on 08/27/21.  Assessment & Plan      Problem List Items Addressed This Visit       Cardiovascular and Mediastinum   Hypertension associated with diabetes ([place]) (Chronic)    -Stable. Continue current medication regimen. Will continue to monitor.        Endocrine   Mixed diabetic hyperlipidemia associated with type 1 diabetes mellitus ([facility]) (Chronic)    -Last lipid panel: HDL 37, LDL 130 -Advised patient to schedule lab visit for fasting labs to repeat lipid panel since starting Zetia 10 mg.  -Will continue to monitor.      Type I diabetes mellitus ([facility]) - Primary    -A1c today 6.7, increased from 6.2 but remains at goal. Continue to follow-up with endocrinology and current medication regimen.  Will continue to monitor.      Relevant Orders   POCT  glycosylated hemoglobin (Hb A1C)   Other Visit Diagnoses     Morbid obesity ([place])          Morbid obesity: -S/p bariatric surgery. Patient has lost 4 pounds since last visit. Recommend to continue with reduced calories, diet and lifestyle changes.  Return in about 4 months (around 12/27/2021) for DM, HTN, HLD; lab visit next week for FBW (lipid panel, cmp).        Lorrene Reid, PA-C  [facility] at [facility] [phone] (phone) [phone] (fax)  [place]

## 2021-08-27 NOTE — Patient Instructions (Signed)
Diabetes Mellitus and Foot Care Foot care is an important part of your health, especially when you have diabetes. Diabetes may cause you to have problems because of poor blood flow (circulation) to your feet and legs, which can cause your skin to: Become thinner and drier. Break more easily. Heal more slowly. Peel and crack. You may also have nerve damage (neuropathy) in your legs and feet, causing decreased feeling in them. This means that you may not notice minor injuries to your feet that could lead to more serious problems. Noticing and addressing any potential problems early is the best way to prevent future foot problems. How to care for your feet Foot hygiene  Wash your feet daily with warm water and mild soap. Do not use hot water. Then, pat your feet and the areas between your toes until they are completely dry. Do not soak your feet as this can dry your skin. Trim your toenails straight across. Do not dig under them or around the cuticle. File the edges of your nails with an emery board or nail file. Apply a moisturizing lotion or petroleum jelly to the skin on your feet and to dry, brittle toenails. Use lotion that does not contain alcohol and is unscented. Do not apply lotion between your toes. Shoes and socks Wear clean socks or stockings every day. Make sure they are not too tight. Do not wear knee-high stockings since they may decrease blood flow to your legs. Wear shoes that fit properly and have enough cushioning. Always look in your shoes before you put them on to be sure there are no objects inside. To break in new shoes, wear them for just a few hours a day. This prevents injuries on your feet. Wounds, scrapes, corns, and calluses  Check your feet daily for blisters, cuts, bruises, sores, and redness. If you cannot see the bottom of your feet, use a mirror or ask someone for help. Do not cut corns or calluses or try to remove them with medicine. If you find a minor scrape,  cut, or break in the skin on your feet, keep it and the skin around it clean and dry. You may clean these areas with mild soap and water. Do not clean the area with peroxide, alcohol, or iodine. If you have a wound, scrape, corn, or callus on your foot, look at it several times a day to make sure it is healing and not infected. Check for: Redness, swelling, or pain. Fluid or blood. Warmth. Pus or a bad smell. General tips Do not cross your legs. This may decrease blood flow to your feet. Do not use heating pads or hot water bottles on your feet. They may burn your skin. If you have lost feeling in your feet or legs, you may not know this is happening until it is too late. Protect your feet from hot and cold by wearing shoes, such as at the beach or on hot pavement. Schedule a complete foot exam at least once a year (annually) or more often if you have foot problems. Report any cuts, sores, or bruises to your health care provider immediately. Where to find more information American Diabetes Association: www.diabetes.org Association of Diabetes Care & Education Specialists: www.diabeteseducator.org Contact a health care provider if: You have a medical condition that increases your risk of infection and you have any cuts, sores, or bruises on your feet. You have an injury that is not healing. You have redness on your legs or feet. You   feel burning or tingling in your legs or feet. You have pain or cramps in your legs and feet. Your legs or feet are numb. Your feet always feel cold. You have pain around any toenails. Get help right away if: You have a wound, scrape, corn, or callus on your foot and: You have pain, swelling, or redness that gets worse. You have fluid or blood coming from the wound, scrape, corn, or callus. Your wound, scrape, corn, or callus feels warm to the touch. You have pus or a bad smell coming from the wound, scrape, corn, or callus. You have a fever. You have a red  line going up your leg. Summary Check your feet every day for blisters, cuts, bruises, sores, and redness. Apply a moisturizing lotion or petroleum jelly to the skin on your feet and to dry, brittle toenails. Wear shoes that fit properly and have enough cushioning. If you have foot problems, report any cuts, sores, or bruises to your health care provider immediately. Schedule a complete foot exam at least once a year (annually) or more often if you have foot problems. This information is not intended to replace advice given to you by your health care provider. Make sure you discuss any questions you have with your health care provider. Document Revised: 07/18/2019 Document Reviewed: 07/18/2019 Elsevier Patient Education  2023 Elsevier Inc.  

## 2021-09-01 ENCOUNTER — Other Ambulatory Visit: Payer: BC Managed Care – PPO

## 2021-09-03 ENCOUNTER — Other Ambulatory Visit: Payer: BC Managed Care – PPO

## 2021-09-03 DIAGNOSIS — E1065 Type 1 diabetes mellitus with hyperglycemia: Secondary | ICD-10-CM | POA: Diagnosis not present

## 2021-09-03 DIAGNOSIS — E1159 Type 2 diabetes mellitus with other circulatory complications: Secondary | ICD-10-CM

## 2021-09-03 DIAGNOSIS — E1069 Type 1 diabetes mellitus with other specified complication: Secondary | ICD-10-CM | POA: Diagnosis not present

## 2021-09-03 DIAGNOSIS — I152 Hypertension secondary to endocrine disorders: Secondary | ICD-10-CM | POA: Diagnosis not present

## 2021-09-04 LAB — COMPREHENSIVE METABOLIC PANEL
ALT: 15 IU/L (ref 0–44)
AST: 14 IU/L (ref 0–40)
Albumin/Globulin Ratio: 1.8 (ref 1.2–2.2)
Albumin: 4.2 g/dL (ref 4.1–5.1)
Alkaline Phosphatase: 98 IU/L (ref 44–121)
BUN/Creatinine Ratio: 11 (ref 9–20)
BUN: 10 mg/dL (ref 6–24)
Bilirubin Total: 0.5 mg/dL (ref 0.0–1.2)
CO2: 26 mmol/L (ref 20–29)
Calcium: 9.2 mg/dL (ref 8.7–10.2)
Chloride: 99 mmol/L (ref 96–106)
Creatinine, Ser: 0.87 mg/dL (ref 0.76–1.27)
Globulin, Total: 2.4 g/dL (ref 1.5–4.5)
Glucose: 127 mg/dL — ABNORMAL HIGH (ref 70–99)
Potassium: 4.8 mmol/L (ref 3.5–5.2)
Sodium: 139 mmol/L (ref 134–144)
Total Protein: 6.6 g/dL (ref 6.0–8.5)
eGFR: 109 mL/min/{1.73_m2} (ref >59)

## 2021-09-04 LAB — LIPID PANEL
Chol/HDL Ratio: 3.5 ratio (ref 0.0–5.0)
Cholesterol, Total: 156 mg/dL (ref 100–199)
HDL: 44 mg/dL (ref >39)
LDL Chol Calc (NIH): 92 mg/dL (ref 0–99)
Triglycerides: 107 mg/dL (ref 0–149)
VLDL Cholesterol Cal: 20 mg/dL (ref 5–40)

## 2021-10-19 ENCOUNTER — Ambulatory Visit
Admission: RE | Admit: 2021-10-19 | Discharge: 2021-10-19 | Disposition: A | Discharge status: Home / Self Care | Payer: BC Managed Care – PPO | Source: Ambulatory Visit | Attending: Surgery | Admitting: Surgery

## 2021-10-19 DIAGNOSIS — K760 Fatty (change of) liver, not elsewhere classified: Secondary | ICD-10-CM | POA: Diagnosis not present

## 2021-10-19 DIAGNOSIS — K439 Ventral hernia without obstruction or gangrene: Secondary | ICD-10-CM | POA: Diagnosis not present

## 2021-10-19 DIAGNOSIS — K8689 Other specified diseases of pancreas: Secondary | ICD-10-CM | POA: Diagnosis not present

## 2021-10-19 DIAGNOSIS — K429 Umbilical hernia without obstruction or gangrene: Secondary | ICD-10-CM | POA: Diagnosis not present

## 2021-10-19 DIAGNOSIS — R1032 Left lower quadrant pain: Secondary | ICD-10-CM

## 2021-10-19 MED ORDER — IOPAMIDOL (ISOVUE-300) INJECTION 61%
100.0000 mL | Freq: Once | INTRAVENOUS | Status: AC | PRN
  Administered 2021-10-19: 100 mL via INTRAVENOUS

## 2021-10-20 ENCOUNTER — Other Ambulatory Visit: Payer: Self-pay | Admitting: Physician Assistant

## 2021-10-20 DIAGNOSIS — I152 Hypertension secondary to endocrine disorders: Secondary | ICD-10-CM

## 2021-11-02 DIAGNOSIS — Z794 Long term (current) use of insulin: Secondary | ICD-10-CM | POA: Diagnosis not present

## 2021-11-08 ENCOUNTER — Other Ambulatory Visit: Payer: Self-pay | Admitting: Physician Assistant

## 2021-11-08 DIAGNOSIS — E1159 Type 2 diabetes mellitus with other circulatory complications: Secondary | ICD-10-CM

## 2021-11-12 NOTE — Progress Notes (Signed)
Sent message, via epic in basket, requesting orders in epic from surgeon.  

## 2021-11-15 ENCOUNTER — Ambulatory Visit: Payer: Self-pay | Admitting: Surgery

## 2021-11-16 NOTE — Progress Notes (Addendum)
Anesthesia Review:  PCP: Neila Gear LOV 08/27/21  Cardiologist : none  Endocrinologist- Dr Francetta Found with [facility]  Chest x-ray : EKG : 11/19/21  Echo : 05/01/20  Stress test: Cardiac Cath :  Activity level: can do a flight of stairs without difficutly  Sleep Study/ CPAP : Fasting Blood Sugar :      / Checks Blood Sugar -- times a day:   Blood Thinner/ Instructions /Last Dose: ASA / Instructions/ Last Dose :  Diabetes- type 1 - has Dexcom- checks approximately hourly per pt  Followed by Dr Hartford Poli at [facility] Insulin Pump - Omnipod  Hgba1c-11/19/21- 6.9  Dexcom   Insulin PUmp Policy on front of chart PT signed Insulin  Pump Contract Policy at preop.  PT has copy Diabetes Coordinator, Jeanine contacted at preop and was made aware of pt and date and time of surgery and that pt was contacting Endocrinologist for preop instructions for Insulin Pump for surgery.   PT aware to contact Endocrinologist for Insulin Pump Instructions in regards to surgery.   Wife present at time of preop appt.  AFter he receives instructions pt has been given phone number for preop nurse at [phone] to let her know what instructions he has received.   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  PT stated that he had not been able to get in contact with DR Francetta Found, endocrinologfist.  Pt stated that for other surgeries that Insulin Pump had been left alone.  Informed pt that I would let Lyndon Code, PAC be aware that he had not been able to get in touch with Endocrinologist.  Lyndon Code, [facility] aware .  NO new orders given.  Called pt back and pt will have insulin pump at basal rate like he has done for previous surgeries.  .  Insulin pump will be on one arm and glucose monitor will be on other arm.

## 2021-11-17 NOTE — Patient Instructions (Signed)
SURGICAL WAITING ROOM VISITATION Patients having surgery or a procedure may have no more than 2 support people in the waiting area - these visitors may rotate.   Children under the age of 71 must have an adult with them who is not the patient. If the patient needs to stay at the hospital during part of their recovery, the visitor guidelines for inpatient rooms apply. Pre-op nurse will coordinate an appropriate time for 1 support person to accompany patient in pre-op.  This support person may not rotate.    Please refer to the [facility] website for the visitor guidelines for Inpatients (after your surgery is over and you are in a regular room).       Your procedure is scheduled on:  11/29/2021    Report to [facility] Main Entrance    Report to admitting at   0830AM   Call this number if you have problems the morning of surgery [phone]   Do not eat food :After Midnight.   After Midnight you may have the following liquids until  0730 AM DAY OF SURGERY  Water Non-Citrus Juices (without pulp, NO RED) Carbonated Beverages Black Coffee (NO MILK/CREAM OR CREAMERS, sugar ok)  Clear Tea (NO MILK/CREAM OR CREAMERS, sugar ok) regular and decaf                             Plain Jell-O (NO RED)                                           Fruit ices (not with fruit pulp, NO RED)                                     Popsicles (NO RED)                                                               Sports drinks like Gatorade (NO RED)                    The day of surgery:  Drink ONE (1) Pre-Surgery Clear Ensure or G2 at   0730AM ( have completed by )  the morning of surgery. Drink in one sitting. Do not sip.  This drink was given to you during your hospital  pre-op appointment visit. Nothing else to drink after completing the  Pre-Surgery Clear Ensure or G2.          If you have questions, please contact your surgeon's office.       Oral Hygiene is also important to reduce  your risk of infection.                                    Remember - BRUSH YOUR TEETH THE MORNING OF SURGERY WITH YOUR REGULAR TOOTHPASTE   Do NOT smoke after Midnight   Take these medicines the morning of surgery with A SIP OF WATER:  amlodipine, toprol , omeprazole if due  to take   Contact endocrinologist regarding Insulin Pump Instrudtions.  Bring Insulin Pump supplies with you.   DO NOT TAKE ANY ORAL DIABETIC MEDICATIONS DAY OF YOUR SURGERY  Bring CPAP mask and tubing day of surgery.                              You may not have any metal on your body including hair pins, jewelry, and body piercing             Do not wear make-up, lotions, powders, perfumes/cologne, or deodorant  Do not wear nail polish including gel and S&S, artificial/acrylic nails, or any other type of covering on natural nails including finger and toenails. If you have artificial nails, gel coating, etc. that needs to be removed by a nail salon please have this removed prior to surgery or surgery may need to be canceled/ delayed if the surgeon/ anesthesia feels like they are unable to be safely monitored.   Do not shave  48 hours prior to surgery.               Men may shave face and neck.   Do not bring valuables to the hospital. [place].   Contacts, dentures or bridgework may not be worn into surgery.   Bring small overnight bag day of surgery.   DO NOT [place]. PHARMACY WILL DISPENSE MEDICATIONS LISTED ON YOUR MEDICATION LIST TO YOU DURING YOUR ADMISSION [place]!    Patients discharged on the day of surgery will not be allowed to drive home.  Someone NEEDS to stay with you for the first 24 hours after anesthesia.   Special Instructions: Bring a copy of your healthcare power of attorney and living will documents the day of surgery if you haven't scanned them before.              Please read over the following  fact sheets you were given: IF [place] [phone]   If you received a COVID test during your pre-op visit  it is requested that you wear a mask when out in public, stay away from anyone that may not be feeling well and notify your surgeon if you develop symptoms. If you test positive for Covid or have been in contact with anyone that has tested positive in the last 10 days please notify you surgeon.    [place] - Preparing for Surgery Before surgery, you can play an important role.  Because skin is not sterile, your skin needs to be as free of germs as possible.  You can reduce the number of germs on your skin by washing with CHG (chlorahexidine gluconate) soap before surgery.  CHG is an antiseptic cleaner which kills germs and bonds with the skin to continue killing germs even after washing. Please DO NOT use if you have an allergy to CHG or antibacterial soaps.  If your skin becomes reddened/irritated stop using the CHG and inform your nurse when you arrive at Short Stay. Do not shave (including legs and underarms) for at least 48 hours prior to the first CHG shower.  You may shave your face/neck. Please follow these instructions carefully:  1.  Shower with CHG Soap the night before surgery and the  morning  of Surgery.  2.  If you choose to wash your hair, wash your hair first as usual with your  normal  shampoo.  3.  After you shampoo, rinse your hair and body thoroughly to remove the  shampoo.                           4.  Use CHG as you would any other liquid soap.  You can apply chg directly  to the skin and wash                       Gently with a scrungie or clean washcloth.  5.  Apply the CHG Soap to your body ONLY FROM THE NECK DOWN.   Do not use on face/ open                           Wound or open sores. Avoid contact with eyes, ears mouth and genitals (private parts).                       Wash face,  Genitals (private parts)  with your normal soap.             6.  Wash thoroughly, paying special attention to the area where your surgery  will be performed.  7.  Thoroughly rinse your body with warm water from the neck down.  8.  DO NOT shower/wash with your normal soap after using and rinsing off  the CHG Soap.                9.  Pat yourself dry with a clean towel.            10.  Wear clean pajamas.            11.  Place clean sheets on your bed the night of your first shower and do not  sleep with pets. Day of Surgery : Do not apply any lotions/deodorants the morning of surgery.  Please wear clean clothes to the hospital/surgery center.  FAILURE TO FOLLOW THESE INSTRUCTIONS MAY RESULT IN THE CANCELLATION OF YOUR SURGERY PATIENT SIGNATURE_________________________________  NURSE SIGNATURE__________________________________  ________________________________________________________________________

## 2021-11-19 ENCOUNTER — Encounter (HOSPITAL_COMMUNITY)
Admission: RE | Admit: 2021-11-19 | Discharge: 2021-11-19 | Disposition: A | Discharge status: Home / Self Care | Payer: BC Managed Care – PPO | Source: Ambulatory Visit | Attending: Surgery | Admitting: Surgery

## 2021-11-19 ENCOUNTER — Encounter (HOSPITAL_COMMUNITY): Payer: Self-pay

## 2021-11-19 ENCOUNTER — Other Ambulatory Visit: Payer: Self-pay

## 2021-11-19 VITALS — BP 153/89 | HR 87 | Temp 98.6°F | Resp 16 | Ht 72.0 in | Wt 219.0 lb

## 2021-11-19 DIAGNOSIS — Z01818 Encounter for other preprocedural examination: Secondary | ICD-10-CM | POA: Insufficient documentation

## 2021-11-19 LAB — BASIC METABOLIC PANEL
Anion gap: 8 (ref 5–15)
BUN: 11 mg/dL (ref 6–20)
CO2: 25 mmol/L (ref 22–32)
Calcium: 8.4 mg/dL — ABNORMAL LOW (ref 8.9–10.3)
Chloride: 104 mmol/L (ref 98–111)
Creatinine, Ser: 0.86 mg/dL (ref 0.61–1.24)
GFR, Estimated: >60 mL/min (ref >60)
Glucose, Bld: 230 mg/dL — ABNORMAL HIGH (ref 70–99)
Potassium: 4 mmol/L (ref 3.5–5.1)
Sodium: 137 mmol/L (ref 135–145)

## 2021-11-19 LAB — CBC
HCT: 40 % (ref 39.0–52.0)
Hemoglobin: 13.3 g/dL (ref 13.0–17.0)
MCH: 31.7 pg (ref 26.0–34.0)
MCHC: 33.3 g/dL (ref 30.0–36.0)
MCV: 95.5 fL (ref 80.0–100.0)
Platelets: 253 10*3/uL (ref 150–400)
RBC: 4.19 MIL/uL — ABNORMAL LOW (ref 4.22–5.81)
RDW: 13 % (ref 11.5–15.5)
WBC: 6.2 10*3/uL (ref 4.0–10.5)
nRBC: 0 % (ref 0.0–0.2)

## 2021-11-19 LAB — HEMOGLOBIN A1C
Hgb A1c MFr Bld: 6.9 % — ABNORMAL HIGH (ref 4.8–5.6)
Mean Plasma Glucose: 151.33 mg/dL

## 2021-11-19 LAB — GLUCOSE, CAPILLARY: Glucose-Capillary: 221 mg/dL — ABNORMAL HIGH (ref 70–99)

## 2021-11-19 LAB — SURGICAL PCR SCREEN
MRSA, PCR: NEGATIVE
Staphylococcus aureus: NEGATIVE

## 2021-11-29 ENCOUNTER — Encounter (HOSPITAL_COMMUNITY)
Admission: RE | Disposition: A | Discharge status: Home / Self Care | Payer: Self-pay | Source: Ambulatory Visit | Attending: Surgery

## 2021-11-29 ENCOUNTER — Ambulatory Visit (HOSPITAL_COMMUNITY): Payer: BC Managed Care – PPO | Admitting: Physician Assistant

## 2021-11-29 ENCOUNTER — Ambulatory Visit (HOSPITAL_COMMUNITY): Payer: BC Managed Care – PPO | Admitting: Anesthesiology

## 2021-11-29 ENCOUNTER — Other Ambulatory Visit: Payer: Self-pay

## 2021-11-29 ENCOUNTER — Encounter (HOSPITAL_COMMUNITY): Payer: Self-pay | Admitting: Surgery

## 2021-11-29 ENCOUNTER — Ambulatory Visit (HOSPITAL_COMMUNITY)
Admission: RE | Admit: 2021-11-29 | Discharge: 2021-11-29 | Disposition: A | Discharge status: Home / Self Care | Payer: BC Managed Care – PPO | Source: Ambulatory Visit | Attending: Surgery | Admitting: Surgery

## 2021-11-29 DIAGNOSIS — K439 Ventral hernia without obstruction or gangrene: Secondary | ICD-10-CM | POA: Diagnosis not present

## 2021-11-29 DIAGNOSIS — Z9884 Bariatric surgery status: Secondary | ICD-10-CM | POA: Insufficient documentation

## 2021-11-29 DIAGNOSIS — M1990 Unspecified osteoarthritis, unspecified site: Secondary | ICD-10-CM | POA: Diagnosis not present

## 2021-11-29 DIAGNOSIS — G894 Chronic pain syndrome: Secondary | ICD-10-CM | POA: Insufficient documentation

## 2021-11-29 DIAGNOSIS — K436 Other and unspecified ventral hernia with obstruction, without gangrene: Secondary | ICD-10-CM | POA: Diagnosis not present

## 2021-11-29 DIAGNOSIS — E109 Type 1 diabetes mellitus without complications: Secondary | ICD-10-CM | POA: Insufficient documentation

## 2021-11-29 DIAGNOSIS — Z794 Long term (current) use of insulin: Secondary | ICD-10-CM | POA: Insufficient documentation

## 2021-11-29 DIAGNOSIS — Z9641 Presence of insulin pump (external) (internal): Secondary | ICD-10-CM | POA: Diagnosis not present

## 2021-11-29 DIAGNOSIS — Z01818 Encounter for other preprocedural examination: Secondary | ICD-10-CM

## 2021-11-29 DIAGNOSIS — I10 Essential (primary) hypertension: Secondary | ICD-10-CM | POA: Diagnosis not present

## 2021-11-29 DIAGNOSIS — K219 Gastro-esophageal reflux disease without esophagitis: Secondary | ICD-10-CM | POA: Diagnosis not present

## 2021-11-29 DIAGNOSIS — E1049 Type 1 diabetes mellitus with other diabetic neurological complication: Secondary | ICD-10-CM | POA: Diagnosis not present

## 2021-11-29 HISTORY — PX: VENTRAL HERNIA REPAIR: SHX424

## 2021-11-29 LAB — GLUCOSE, CAPILLARY: Glucose-Capillary: 227 mg/dL — ABNORMAL HIGH (ref 70–99)

## 2021-11-29 SURGERY — REPAIR, HERNIA, VENTRAL, LAPAROSCOPIC
Anesthesia: General

## 2021-11-29 MED ORDER — PHENYLEPHRINE 80 MCG/ML (10ML) SYRINGE FOR IV PUSH (FOR BLOOD PRESSURE SUPPORT)
PREFILLED_SYRINGE | INTRAVENOUS | Status: DC | PRN
  Administered 2021-11-29 (×5): 80 ug via INTRAVENOUS
  Administered 2021-11-29: 160 ug via INTRAVENOUS

## 2021-11-29 MED ORDER — PROPOFOL 10 MG/ML IV BOLUS
INTRAVENOUS | Status: DC | PRN
  Administered 2021-11-29: 200 mg via INTRAVENOUS

## 2021-11-29 MED ORDER — DEXAMETHASONE SODIUM PHOSPHATE 10 MG/ML IJ SOLN
INTRAMUSCULAR | Status: DC | PRN
  Administered 2021-11-29: 10 mg via INTRAVENOUS

## 2021-11-29 MED ORDER — DEXAMETHASONE SODIUM PHOSPHATE 10 MG/ML IJ SOLN
INTRAMUSCULAR | Status: AC
  Filled 2021-11-29: qty 1

## 2021-11-29 MED ORDER — KETAMINE HCL 10 MG/ML IJ SOLN
INTRAMUSCULAR | Status: DC | PRN
  Administered 2021-11-29 (×2): 10 mg via INTRAVENOUS
  Administered 2021-11-29: 30 mg via INTRAVENOUS

## 2021-11-29 MED ORDER — 0.9 % SODIUM CHLORIDE (POUR BTL) OPTIME
TOPICAL | Status: DC | PRN
  Administered 2021-11-29: 1000 mL

## 2021-11-29 MED ORDER — ACETAMINOPHEN 500 MG PO TABS
1000.0000 mg | ORAL_TABLET | Freq: Once | ORAL | Status: AC
  Administered 2021-11-29: 1000 mg via ORAL
  Filled 2021-11-29: qty 2

## 2021-11-29 MED ORDER — OXYCODONE HCL 5 MG/5ML PO SOLN: 5.0000 mg | Freq: Once | ORAL | Status: DC | PRN

## 2021-11-29 MED ORDER — FENTANYL CITRATE PF 50 MCG/ML IJ SOSY: 25.0000 ug | PREFILLED_SYRINGE | INTRAMUSCULAR | Status: DC | PRN

## 2021-11-29 MED ORDER — FENTANYL CITRATE (PF) 100 MCG/2ML IJ SOLN
INTRAMUSCULAR | Status: DC | PRN
  Administered 2021-11-29: 100 ug via INTRAVENOUS

## 2021-11-29 MED ORDER — OXYCODONE HCL 5 MG PO TABS: 5.0000 mg | ORAL_TABLET | Freq: Once | ORAL | Status: DC | PRN

## 2021-11-29 MED ORDER — SCOPOLAMINE 1 MG/3DAYS TD PT72
1.0000 | MEDICATED_PATCH | TRANSDERMAL | Status: DC
  Administered 2021-11-29: 1.5 mg via TRANSDERMAL
  Filled 2021-11-29: qty 1

## 2021-11-29 MED ORDER — ONDANSETRON HCL 4 MG/2ML IJ SOLN
INTRAMUSCULAR | Status: AC
  Filled 2021-11-29: qty 2

## 2021-11-29 MED ORDER — LIDOCAINE HCL 2 % IJ SOLN
INTRAMUSCULAR | Status: AC
  Filled 2021-11-29: qty 20

## 2021-11-29 MED ORDER — ACETAMINOPHEN 500 MG PO TABS: 1000.0000 mg | ORAL_TABLET | ORAL | Status: DC

## 2021-11-29 MED ORDER — ONDANSETRON HCL 4 MG/2ML IJ SOLN
INTRAMUSCULAR | Status: DC | PRN
  Administered 2021-11-29: 4 mg via INTRAVENOUS

## 2021-11-29 MED ORDER — CHLORHEXIDINE GLUCONATE CLOTH 2 % EX PADS: 6.0000 | MEDICATED_PAD | Freq: Once | CUTANEOUS | Status: DC

## 2021-11-29 MED ORDER — ROCURONIUM BROMIDE 10 MG/ML (PF) SYRINGE
PREFILLED_SYRINGE | INTRAVENOUS | Status: DC | PRN
  Administered 2021-11-29 (×2): 10 mg via INTRAVENOUS
  Administered 2021-11-29: 60 mg via INTRAVENOUS

## 2021-11-29 MED ORDER — HYDROCODONE-ACETAMINOPHEN 5-325 MG PO TABS: 1.0000 | ORAL_TABLET | ORAL | 0 refills | Status: AC | PRN

## 2021-11-29 MED ORDER — PROMETHAZINE HCL 25 MG/ML IJ SOLN: 6.2500 mg | INTRAMUSCULAR | Status: DC | PRN

## 2021-11-29 MED ORDER — MIDAZOLAM HCL 2 MG/2ML IJ SOLN
INTRAMUSCULAR | Status: AC
  Filled 2021-11-29: qty 2

## 2021-11-29 MED ORDER — PROPOFOL 10 MG/ML IV BOLUS
INTRAVENOUS | Status: AC
  Filled 2021-11-29: qty 20

## 2021-11-29 MED ORDER — LIDOCAINE 2% (20 MG/ML) 5 ML SYRINGE
INTRAMUSCULAR | Status: DC | PRN
  Administered 2021-11-29: 1.5 mg/kg/h via INTRAVENOUS
  Administered 2021-11-29: 80 mg via INTRAVENOUS

## 2021-11-29 MED ORDER — BUPIVACAINE LIPOSOME 1.3 % IJ SUSP
INTRAMUSCULAR | Status: DC | PRN
  Administered 2021-11-29: 20 mL

## 2021-11-29 MED ORDER — DEXMEDETOMIDINE HCL IN NACL 200 MCG/50ML IV SOLN
INTRAVENOUS | Status: DC | PRN
  Administered 2021-11-29 (×4): 4 ug via INTRAVENOUS

## 2021-11-29 MED ORDER — SODIUM CHLORIDE (PF) 0.9 % IJ SOLN
INTRAMUSCULAR | Status: DC | PRN
  Administered 2021-11-29: 10 mL

## 2021-11-29 MED ORDER — KETAMINE HCL 50 MG/5ML IJ SOSY
PREFILLED_SYRINGE | INTRAMUSCULAR | Status: AC
  Filled 2021-11-29: qty 5

## 2021-11-29 MED ORDER — SUGAMMADEX SODIUM 200 MG/2ML IV SOLN
INTRAVENOUS | Status: DC | PRN
  Administered 2021-11-29: 200 mg via INTRAVENOUS

## 2021-11-29 MED ORDER — LACTATED RINGERS IV SOLN: INTRAVENOUS | Status: DC

## 2021-11-29 MED ORDER — ORAL CARE MOUTH RINSE: 15.0000 mL | Freq: Once | OROMUCOSAL | Status: AC

## 2021-11-29 MED ORDER — CELECOXIB 200 MG PO CAPS
ORAL_CAPSULE | ORAL | Status: AC
  Administered 2021-11-29: 200 mg via ORAL
  Filled 2021-11-29: qty 1

## 2021-11-29 MED ORDER — BUPIVACAINE LIPOSOME 1.3 % IJ SUSP
INTRAMUSCULAR | Status: AC
  Filled 2021-11-29: qty 20

## 2021-11-29 MED ORDER — FENTANYL CITRATE (PF) 100 MCG/2ML IJ SOLN
INTRAMUSCULAR | Status: AC
  Filled 2021-11-29: qty 2

## 2021-11-29 MED ORDER — SODIUM CHLORIDE (PF) 0.9 % IJ SOLN
INTRAMUSCULAR | Status: AC
  Filled 2021-11-29: qty 10

## 2021-11-29 MED ORDER — CEFAZOLIN SODIUM-DEXTROSE 2-4 GM/100ML-% IV SOLN
2.0000 g | INTRAVENOUS | Status: AC
  Administered 2021-11-29: 2 g via INTRAVENOUS
  Filled 2021-11-29: qty 100

## 2021-11-29 MED ORDER — CHLORHEXIDINE GLUCONATE 0.12 % MT SOLN
15.0000 mL | Freq: Once | OROMUCOSAL | Status: AC
  Administered 2021-11-29: 15 mL via OROMUCOSAL

## 2021-11-29 MED ORDER — MIDAZOLAM HCL 5 MG/5ML IJ SOLN
INTRAMUSCULAR | Status: DC | PRN
  Administered 2021-11-29: 2 mg via INTRAVENOUS

## 2021-11-29 MED ORDER — CELECOXIB 200 MG PO CAPS
200.0000 mg | ORAL_CAPSULE | Freq: Once | ORAL | Status: AC
  Filled 2021-11-29: qty 1

## 2021-11-29 SURGICAL SUPPLY — 51 items
ADH SKN CLS APL DERMABOND .7 (GAUZE/BANDAGES/DRESSINGS) ×1
APL PRP STRL LF DISP 70% ISPRP (MISCELLANEOUS) ×1
BAG COUNTER SPONGE SURGICOUNT (BAG) IMPLANT
BAG SPNG CNTER NS LX DISP (BAG)
BINDER ABDOMINAL 12 ML 46-62 (SOFTGOODS) IMPLANT
CABLE HIGH FREQUENCY MONO STRZ (ELECTRODE) IMPLANT
CHLORAPREP W/TINT 26 (MISCELLANEOUS) ×1 IMPLANT
COVER SURGICAL LIGHT HANDLE (MISCELLANEOUS) ×1 IMPLANT
DERMABOND ADVANCED .7 DNX12 (GAUZE/BANDAGES/DRESSINGS) ×1 IMPLANT
DEVICE SECURE STRAP 25 ABSORB (INSTRUMENTS) IMPLANT
DEVICE TROCAR PUNCTURE CLOSURE (ENDOMECHANICALS) ×1 IMPLANT
DISSECTOR BLUNT TIP ENDO 5MM (MISCELLANEOUS) IMPLANT
ELECT L-HOOK LAP 45CM DISP (ELECTROSURGICAL)
ELECT PENCIL ROCKER SW 15FT (MISCELLANEOUS) IMPLANT
ELECT REM PT RETURN 15FT ADLT (MISCELLANEOUS) ×1 IMPLANT
ELECTRODE L-HOOK LAP 45CM DISP (ELECTROSURGICAL) IMPLANT
GLOVE SURG LX STRL 8.0 MICRO (GLOVE) ×1 IMPLANT
GOWN STRL REUS W/ TWL XL LVL3 (GOWN DISPOSABLE) ×3 IMPLANT
GOWN STRL REUS W/TWL XL LVL3 (GOWN DISPOSABLE) ×3
IRRIG SUCT STRYKERFLOW 2 WTIP (MISCELLANEOUS)
IRRIGATION SUCT STRKRFLW 2 WTP (MISCELLANEOUS) IMPLANT
KIT BASIN OR (CUSTOM PROCEDURE TRAY) ×1 IMPLANT
KIT TURNOVER KIT A (KITS) IMPLANT
MARKER SKIN DUAL TIP RULER LAB (MISCELLANEOUS) ×1 IMPLANT
MESH VENTRALEX ST 8CM LRG (Mesh General) IMPLANT
MESH VENTRALIGHT ST 4.5IN (Mesh General) IMPLANT
NDL SPNL 22GX3.5 QUINCKE BK (NEEDLE) ×1 IMPLANT
NEEDLE SPNL 22GX3.5 QUINCKE BK (NEEDLE) ×1 IMPLANT
SCISSORS LAP 5X45 EPIX DISP (ENDOMECHANICALS) ×1 IMPLANT
SCRUB TECHNI CARE 4 OZ NO DYE (MISCELLANEOUS) ×1 IMPLANT
SET TUBE SMOKE EVAC HIGH FLOW (TUBING) ×1 IMPLANT
SHEARS HARMONIC ACE PLUS 45CM (MISCELLANEOUS) IMPLANT
SLEEVE ADV FIXATION 5X100MM (TROCAR) IMPLANT
SLEEVE Z-THREAD 5X100MM (TROCAR) IMPLANT
SPIKE FLUID TRANSFER (MISCELLANEOUS) ×1 IMPLANT
STAPLER VISISTAT 35W (STAPLE) IMPLANT
SUT MNCRL AB 4-0 PS2 18 (SUTURE) ×1 IMPLANT
SUT NOVA NAB DX-16 0-1 5-0 T12 (SUTURE) ×1 IMPLANT
SUT VIC AB 3-0 SH 18 (SUTURE) IMPLANT
SUT VIC AB 3-0 SH 27 (SUTURE) ×1
SUT VIC AB 3-0 SH 27XBRD (SUTURE) IMPLANT
SYR 10ML ECCENTRIC (SYRINGE) IMPLANT
TACKER 5MM HERNIA 3.5CML NAB (ENDOMECHANICALS) IMPLANT
TOWEL OR 17X26 10 PK STRL BLUE (TOWEL DISPOSABLE) ×1 IMPLANT
TOWEL OR NON WOVEN STRL DISP B (DISPOSABLE) ×1 IMPLANT
TRAY FOLEY MTR SLVR 16FR STAT (SET/KITS/TRAYS/PACK) IMPLANT
TRAY LAPAROSCOPIC (CUSTOM PROCEDURE TRAY) ×1 IMPLANT
TROCAR 11X100 Z THREAD (TROCAR) IMPLANT
TROCAR ADV FIXATION 5X100MM (TROCAR) IMPLANT
TROCAR Z-THREAD OPTICAL 5X100M (TROCAR) ×1 IMPLANT
YANKAUER SUCT BULB TIP NO VENT (SUCTIONS) ×1 IMPLANT

## 2021-11-29 NOTE — Anesthesia Postprocedure Evaluation (Signed)
Anesthesia Post Note  Patient: Michael Hutchinson  Procedure(s) Performed: LAPAROSCOPIC VENTRAL HERNIA REPAIR WITH MESH     Patient location during evaluation: PACU Anesthesia Type: General Level of consciousness: awake and alert Pain management: pain level controlled Vital Signs Assessment: post-procedure vital signs reviewed and stable Respiratory status: spontaneous breathing, nonlabored ventilation and respiratory function stable Cardiovascular status: stable and blood pressure returned to baseline Anesthetic complications: no   No notable events documented.  Last Vitals:  Vitals:   11/29/21 1300 11/29/21 1316  BP: 124/68 (!) 145/73  Pulse: 78 81  Resp: 17 18  Temp: 37.5 C 36.8 C  SpO2: 98% 97%    Last Pain:  Vitals:   11/29/21 1316  TempSrc: Oral  PainSc: 0-No pain                 Beryle Lathe

## 2021-11-29 NOTE — H&P (Signed)
Chief Complaint:  ventral hernia at trocar site  History of Present Illness:  NORMAL RECINOS is an 46 y.o. male who had a lap roux en Y gastric bypass 1 year ago.  He has developed a ventral hernia around his umbilicus.    Past Medical History:  Diagnosis Date   Charcot's joint of foot 11/25/2013   Complication of anesthesia    "I wake up angry" (12/31/2015)   Diabetes mellitus type 1 (HCC) dx'd 1981   Diabetic ketoacidosis (HCC)    Essential hypertension 05/03/2013   GERD (gastroesophageal reflux disease)    High cholesterol    Hx MRSA infection    Inner thigh and under arm- healed areas   Meniscus tear     Past Surgical History:  Procedure Laterality Date   ABDOMINAL SURGERY     AMPUTATION Left 01/01/2016   Procedure: AMPUTATION BELOW KNEE;  Surgeon: Toni Arthurs, MD;  Location: [facility] OR;  Service: Orthopedics;  Laterality: Left;   APPLICATION OF WOUND VAC  04/26/2016   CARPAL TUNNEL WITH CUBITAL TUNNEL Right 10/22/2019   Procedure: Right carpal tunnel release, right cubital tunnel release in situ, Right pronator/proximal median nerve release, Right elbow ECRB debridement with partial ostectomy and repair as necessary;  Surgeon: Dominica Severin, MD;  Location: [facility] OR;  Service: Orthopedics;  Laterality: Right;    GASTRIC ROUX-EN-Y N/A 12/14/2020   Procedure: LAPAROSCOPIC ROUX-EN-Y GASTRIC BYPASS WITH UPPER ENDOSCOPY;  Surgeon: Luretha Murphy, MD;  Location: [facility] ORS;  Service: General;  Laterality: N/A;   HERNIA REPAIR     I & D EXTREMITY Left 04/26/2016   Procedure: IRRIGATION AND DEBRIDEMENT EXTREMITY/Left Leg/Possible Wound Vac;  Surgeon: Toni Arthurs, MD;  Location: [facility] OR;  Service: Orthopedics;  Laterality: Left;   INCISE AND DRAIN ABCESS Left 04/26/2016   KNEE ARTHROSCOPY Left ~ 2010   LAPAROSCOPIC CHOLECYSTECTOMY  2015   METACARPOPHALANGEAL JOINT ARTHRODESIS Left 06/2012   Fracture left index finger intra-articular MCP joint/notes 06/30/2012   OPEN REDUCTION INTERNAL  FIXATION (ORIF) PROXIMAL PHALANX Left 06/30/2012   Procedure: OPEN REDUCTION INTERNAL FIXATION (ORIF) LEFT INDEX FINGER PROXIMAL PHALANX FRACTURE WITH LIGAMENT REPAIR AS NECESSARY;  Surgeon: Dominica Severin, MD;  Location: [facility] OR;  Service: Orthopedics;  Laterality: Left;   UMBILICAL HERNIA REPAIR  2015   "w/gallbladder OR"    Current Facility-Administered Medications  Medication Dose Route Frequency Provider Last Rate Last Admin   ceFAZolin (ANCEF) IVPB 2g/100 mL premix  2 g Intravenous On Call to OR Luretha Murphy, MD       Chlorhexidine Gluconate Cloth 2 % PADS 6 each  6 each Topical Once Luretha Murphy, MD       And   Chlorhexidine Gluconate Cloth 2 % PADS 6 each  6 each Topical Once Luretha Murphy, MD       lactated ringers infusion   Intravenous Continuous Shelton Silvas, MD 10 mL/hr at 11/29/21 5400 Continued from Pre-op at 11/29/21 0812   scopolamine (TRANSDERM-SCOP) 1 MG/3DAYS 1.5 mg  1 patch Transdermal On Call to OR Luretha Murphy, MD   1.5 mg at 11/29/21 8676   Influenza vac split quad, Influenza virus vaccine split, Statins, Hydrochlorothiazide, and Lisinopril Family History  Problem Relation Age of Onset   Other Mother    Cancer Mother        Breast / Bone   Heart attack Father    Hypertension Father    Hyperlipidemia Father    Diabetes Other    Alcohol abuse Sister  Diabetes Maternal Grandfather    Stroke Paternal Grandmother    Alcohol abuse Paternal Grandfather    Social History:   reports that he has never smoked. He quit smokeless tobacco use about 11 years ago.  His smokeless tobacco use included snuff and chew. He reports current alcohol use of about 12.0 standard drinks of alcohol per week. He reports that he does not use drugs.   REVIEW OF SYSTEMS : Negative except for see problem list  Physical Exam:   Blood pressure 139/75, pulse 89, temperature 99.6 F (37.6 C), temperature source Oral, resp. rate 16, height 6' (1.829 m), weight 99.3 kg, SpO2 98  %. Body mass index is 29.7 kg/m.  Gen:  WDWN WM NAD  Neurological: Alert and oriented to person, place, and time. Motor and sensory function is grossly intact  Head: Normocephalic and atraumatic.  Eyes: Conjunctivae are normal. Pupils are equal, round, and reactive to light. No scleral icterus.  Neck: Normal range of motion. Neck supple. No tracheal deviation or thyromegaly present.  Cardiovascular:  SR without murmurs or gallops.  No carotid bruits Breast:  not examined Respiratory: Effort normal.  No respiratory distress. No chest wall tenderness. Breath sounds normal.  No wheezes, rales or rhonchi.  Abdomen:  obese-has lost about 150 lbs;  palpable mass is marked around the umbilicus GU:  not examined Musculoskeletal: Normal range of motion. Extremities left BKA from DM and MS.  Lymphadenopathy: No cervical, preauricular, postauricular or axillary adenopathy is present Skin: Skin is warm and dry. No rash noted. No diaphoresis. No erythema. No pallor. Pscyh: Normal mood and affect. Behavior is normal. Judgment and thought content normal.   LABORATORY RESULTS: Results for orders placed or performed during the hospital encounter of 11/29/21 (from the past 48 hour(s))  Glucose, capillary     Status: Abnormal   Collection Time: 11/29/21  7:48 AM  Result Value Ref Range   Glucose-Capillary 227 (H) 70 - 99 mg/dL    Comment: Glucose reference range applies only to samples taken after fasting for at least 8 hours.   Comment 1 Notify RN      RADIOLOGY RESULTS: No results found.  Problem List: Patient Active Problem List   Diagnosis Date Noted   Sprain of MCL (medial collateral ligament) of knee 05/07/2021   Acute diffuse otitis externa of right ear 03/21/2021   History of Roux-en-Y gastric bypass 12/14/2020   Multiple thyroid nodules 05/20/2020   Carpal tunnel syndrome of right wrist 09/09/2019   Cubital tunnel syndrome 09/09/2019   Pronator syndrome 09/09/2019   Cervical spine pain  08/07/2019   Amputated below knee (HCC) 06/28/2019   Cellulitis 06/28/2019   Sacral back pain 12/26/2018   Lumbar spondylosis 12/26/2018   Chronic pain syndrome 11/07/2018   Adhesive capsulitis of right shoulder 07/27/2018   Tendinosis of right shoulder 04/09/2018   Pain in joint of right shoulder 02/14/2018   Burning in the chest 09/20/2017   Statin intolerance 08/08/2017   Multiple sclerosis (HCC) 07/11/2017   Low hemoglobin 07/11/2017   Lateral epicondylitis of right elbow 03/20/2017   Pain in joint of right elbow 03/20/2017   Chronic fatigue 07/04/2016   Impaired exercise tolerance 07/04/2016   Acute left otitis media 06/09/2016   Hx of BKA, left - with subsequent cellulitis and treatment for osteomylitis 06/01/2016   Abscess of left leg 04/26/2016   Normocytic anemia 01/01/2016   Osteomyelitis (HCC) 12/31/2015   Diabetic ulcer of left heel associated with type 1 diabetes mellitus,  with necrosis of bone (HCC) 12/31/2015   Ankle wound 12/31/2015   Chronic osteomyelitis of left ankle with draining sinus (HCC)    Type 1 diabetes, uncontrolled, with Charcot's joint of foot 10/12/2015   Low HDL (under 40) 09/22/2015   Obesity, Class III, BMI 40-49.9 (morbid obesity) (HCC) 09/22/2015   H/O noncompliance with medical treatment, presenting hazards to health 09/22/2015   Hx MRSA infection 09/22/2015   Vitamin D deficiency 09/22/2015   Right foot injury, sequela 09/26/2014   Charcot's joint of foot 11/25/2013   Hypertension associated with diabetes (HCC) 05/03/2013   Type I diabetes mellitus (HCC) 05/03/2013   Body mass index (BMI) of 40.0-44.9 in adult (HCC) 05/03/2013   Morbid (severe) obesity due to excess calories (HCC) 05/03/2013   Mixed diabetic hyperlipidemia associated with type 1 diabetes mellitus (HCC) 07/26/2012   Hypercholesterolemia 07/26/2012   Diabetic ketoacidosis (HCC)    GERD (gastroesophageal reflux disease)    Meniscus tear    Epigastric pain 12/10/2011    Vomiting 12/10/2011   Problems influencing health status 11/21/2011    Assessment & Plan: Ventral hernia containing small bowel by CT. For lap assisted repair    Matt B. Daphine Deutscher, MD, [facility] [phone] beeper [phone]  11/29/2021 9:33 AM

## 2021-11-29 NOTE — Anesthesia Preprocedure Evaluation (Addendum)
Anesthesia Evaluation  Patient identified by MRN, date of birth, ID band Patient awake    Reviewed: Allergy & Precautions, NPO status , Patient's Chart, lab work & pertinent test results, reviewed documented beta blocker date and time   History of Anesthesia Complications Negative for: history of anesthetic complications  Airway Mallampati: II  TM Distance: >3 FB Neck ROM: Full    Dental  (+) Dental Advisory Given, Chipped, Missing   Pulmonary neg pulmonary ROS   Pulmonary exam normal        Cardiovascular hypertension, Pt. on home beta blockers and Pt. on medications Normal cardiovascular exam     Neuro/Psych  Neuromuscular disease  negative psych ROS   GI/Hepatic ,GERD  Medicated and Controlled,,(+)     substance abuse  alcohol use Gastric bypass    Endo/Other  diabetes, Type 1, Insulin Dependent   Insulin pump   Renal/GU negative Renal ROS     Musculoskeletal  (+) Arthritis ,  narcotic dependent  Abdominal   Peds  Hematology negative hematology ROS (+)   Anesthesia Other Findings Chronic pain syndrome   Reproductive/Obstetrics                             Anesthesia Physical Anesthesia Plan  ASA: 3  Anesthesia Plan: General   Post-op Pain Management: Ketamine IV*, Tylenol PO (pre-op)* and Celebrex PO (pre-op)*   Induction: Intravenous  PONV Risk Score and Plan: 2 and Treatment may vary due to age or medical condition, Ondansetron, Midazolam and Scopolamine patch - Pre-op  Airway Management Planned: Oral ETT  Additional Equipment: None  Intra-op Plan:   Post-operative Plan: Extubation in OR  Informed Consent: I have reviewed the patients History and Physical, chart, labs and discussed the procedure including the risks, benefits and alternatives for the proposed anesthesia with the patient or authorized representative who has indicated his/her understanding and  acceptance.     Dental advisory given  Plan Discussed with: CRNA and Anesthesiologist  Anesthesia Plan Comments:         Anesthesia Quick Evaluation

## 2021-11-29 NOTE — Op Note (Signed)
MARQUIE ADERHOLD  [birth year]   11/29/2021    PCP:  Mayer Masker, PA-C   Surgeon: Wenda Low, MD, FACS  Asst:  Doreatha Lew, MD  Anes:  general  Preop Dx: Ventral umbilical hernia Postop Dx: same  Procedure: Lap assisted ventral hernia repair with Ventralex patch and primary closure Location Surgery: [facility] 1 Complications: None noted  EBL:   minimal cc  Drains: none  Description of Procedure:  The patient was taken to OR 1 .  After anesthesia was administered and the patient was prepped  with ChloraPrep and a timeout was performed.  Optiview through the left upper quadrant without difficulty.  A second such 5 mm was placed on the left side.  The hernia was visible around the umbilical region and appeared to probably be a small umbilical hernia with a trocar hernia to the side of that on the right side.  There was no bowel stuck In it.  With the insufflation it was easily palpable in the outside.  A transverse incision was made and the sac was was dissected free from the surrounding tissue and so we eventually got down to the fascial level.  At that point it was opened and then removed from the fascia.  There was a little bump in the superiorly where the umbilical hernia was we closed that with a #1 Novafil and similarly there was one down on the left side that we similarly closed with a #1 Novafil.  The resultant fascial defect was less than 2 fingerbreadths or so.  I used the largest Ventralex patch that had the straps.  I placed that inside and then we secured it to the fascia with 4 sutures up through the inside of the mesh and tied this down.  We then closed the fascia primarily.  We insufflated the abdomen and looked at the lying of the mesh and it seemed to be reasonably held and so we secured that with multiple secure strap staples around the perimeter and on the inside.  It looked good at the completion.  The patient will need to use an abdominal binder.  He is still at risk for  recurrence because his weight is still 300 pounds.  Wound was closed with 3-0 interrupted Vicryl in the subcutaneous tissue and then a running 4-0 Monocryl.  Prior to closure we injected the area with 30 cc of diluted Exparel and then the port sites were also closed with Dermabond.  The patient tolerated the procedure well and was taken to the PACU in stable condition.     Matt B. Daphine Deutscher, MD, [facility], [place] [phone]

## 2021-11-29 NOTE — Anesthesia Procedure Notes (Signed)
Procedure Name: Intubation Date/Time: 11/29/2021 9:52 AM  Performed by: Shakesha Soltau D, CRNAPre-anesthesia Checklist: Patient identified, Emergency Drugs available, Suction available and Patient being monitored Patient Re-evaluated:Patient Re-evaluated prior to induction Oxygen Delivery Method: Circle system utilized Preoxygenation: Pre-oxygenation with 100% oxygen Induction Type: IV induction Ventilation: Mask ventilation without difficulty Laryngoscope Size: Mac and 4 Grade View: Grade III Tube type: Oral Number of attempts: 1 Airway Equipment and Method: Stylet and Oral airway Placement Confirmation: ETT inserted through vocal cords under direct vision, positive ETCO2 and breath sounds checked- equal and bilateral Secured at: 23 cm Tube secured with: Tape Dental Injury: Teeth and Oropharynx as per pre-operative assessment

## 2021-11-29 NOTE — Interval H&P Note (Signed)
History and Physical Interval Note:  11/29/2021 9:36 AM  Michael Hutchinson  has presented today for surgery, with the diagnosis of PERIUMBILICAL VENTRAL HERNIA.  The various methods of treatment have been discussed with the patient and family. After consideration of risks, benefits and other options for treatment, the patient has consented to  Procedure(s): LAPAROSCOPIC VENTRAL HERNIA (N/A) as a surgical intervention.  The patient's history has been reviewed, patient examined, no change in status, stable for surgery.  I have reviewed the patient's chart and labs.  Questions were answered to the patient's satisfaction.     [name]

## 2021-11-29 NOTE — Transfer of Care (Signed)
Immediate Anesthesia Transfer of Care Note  Patient: Michael Hutchinson  Procedure(s) Performed: LAPAROSCOPIC VENTRAL HERNIA REPAIR WITH MESH  Patient Location: PACUawake, alert , and oriented  Anesthesia Type:General  Level of Consciousness: awake, alert , and oriented  Airway & Oxygen Therapy: Patient Spontanous Breathing  Post-op Assessment: Report given to RN and Post -op Vital signs reviewed and stable  Post vital signs: Reviewed and stable  Last Vitals:  Vitals Value Taken Time  BP 154/101 11/29/21 1210  Temp    Pulse 85 11/29/21 1211  Resp 23 11/29/21 1211  SpO2 93 % 11/29/21 1211  Vitals shown include unvalidated device data.  Last Pain:  Vitals:   11/29/21 0807  TempSrc:   PainSc: 0-No pain      Patients Stated Pain Goal: 4 (11/29/21 0807)  Complications: No notable events documented.

## 2021-11-30 ENCOUNTER — Encounter (HOSPITAL_COMMUNITY): Payer: Self-pay | Admitting: Surgery

## 2021-12-08 ENCOUNTER — Telehealth: Payer: Self-pay

## 2021-12-08 NOTE — Patient Outreach (Signed)
  Care Coordination   Initial Visit Note   12/08/2021 Name: Michael Hutchinson MRN: [id number] DOB: 10-16-1975  Michael Hutchinson is a 46 y.o. year old male who sees Mayer Masker, [place] for primary care. I spoke with  Levy Pupa by phone today.  What matters to the patients health and wellness today?  none    Goals Addressed             This Visit's Progress    COMPLETED: Care Coordination Activities-No follow up required       Care Coordination Interventions: Advised patient to Schedule annual visits and follow ups.  Discussed [facility] services and support.            SDOH assessments and interventions completed:  Yes  SDOH Interventions Today    Flowsheet Row Most Recent Value  SDOH Interventions   Housing Interventions Intervention Not Indicated  Transportation Interventions Intervention Not Indicated        Care Coordination Interventions:  Yes, provided   Follow up plan: No further intervention required.   Encounter Outcome:  Pt. Visit Completed   Bary Leriche, RN, MSN [facility] Care Management Care Management Coordinator Direct Line [phone]

## 2021-12-08 NOTE — Patient Instructions (Signed)
Visit Information  Thank you for taking time to visit with me today. Please don't hesitate to contact me if I can be of assistance to you.   Following are the goals we discussed today:   Goals Addressed             This Visit's Progress    COMPLETED: Care Coordination Activities-No follow up required       Care Coordination Interventions: Advised patient to Schedule annual visits and follow ups.  Discussed [facility] services and support.             If you are experiencing a Mental Health or Behavioral Health Crisis or need someone to talk to, please call the Suicide and Crisis Lifeline: 988   Patient verbalizes understanding of instructions and care plan provided today and agrees to view in MyChart. Active MyChart status and patient understanding of how to access instructions and care plan via MyChart confirmed with patient.     No further follow up required: decline  Bary Leriche, RN, MSN [facility] Care Management Care Management Coordinator Direct Line [phone]

## 2021-12-13 DIAGNOSIS — M25532 Pain in left wrist: Secondary | ICD-10-CM | POA: Diagnosis not present

## 2021-12-13 DIAGNOSIS — G894 Chronic pain syndrome: Secondary | ICD-10-CM | POA: Diagnosis not present

## 2021-12-22 DIAGNOSIS — K436 Other and unspecified ventral hernia with obstruction, without gangrene: Secondary | ICD-10-CM | POA: Diagnosis not present

## 2021-12-22 DIAGNOSIS — Z8719 Personal history of other diseases of the digestive system: Secondary | ICD-10-CM | POA: Diagnosis not present

## 2021-12-22 DIAGNOSIS — Z9889 Other specified postprocedural states: Secondary | ICD-10-CM | POA: Diagnosis not present

## 2021-12-27 ENCOUNTER — Ambulatory Visit: Payer: BC Managed Care – PPO | Admitting: Nurse Practitioner

## 2021-12-27 ENCOUNTER — Ambulatory Visit: Payer: BC Managed Care – PPO | Admitting: Physician Assistant

## 2021-12-28 ENCOUNTER — Ambulatory Visit: Payer: BC Managed Care – PPO | Admitting: Physician Assistant

## 2022-01-05 ENCOUNTER — Other Ambulatory Visit: Payer: Self-pay | Admitting: Nurse Practitioner

## 2022-01-05 DIAGNOSIS — E1159 Type 2 diabetes mellitus with other circulatory complications: Secondary | ICD-10-CM

## 2022-02-02 ENCOUNTER — Other Ambulatory Visit: Payer: Self-pay | Admitting: Nurse Practitioner

## 2022-02-02 ENCOUNTER — Ambulatory Visit: Payer: BC Managed Care – PPO | Admitting: Nurse Practitioner

## 2022-02-02 DIAGNOSIS — I152 Hypertension secondary to endocrine disorders: Secondary | ICD-10-CM

## 2022-02-15 ENCOUNTER — Other Ambulatory Visit: Payer: Self-pay | Admitting: Nurse Practitioner

## 2022-02-15 DIAGNOSIS — E1159 Type 2 diabetes mellitus with other circulatory complications: Secondary | ICD-10-CM

## 2022-02-24 ENCOUNTER — Ambulatory Visit: Payer: BC Managed Care – PPO | Admitting: Nurse Practitioner

## 2022-03-01 IMAGING — CR DG UGI W/ HIGH DENSITY W/O KUB
7 of 14 series · 7 of 17 positions shown · non-contrast
Comparison: None.

CLINICAL DATA: MORBID OBESITY, PREOP SURGERY

EXAM:
UPPER GI SERIES WITH KUB
TECHNIQUE: After obtaining a scout radiograph a routine upper GI series was
performed using thin and high density barium.
FLUOROSCOPY TIME:  Fluoroscopy Time:  0.8 MINUTES
Radiation Exposure Index (if provided by the fluoroscopic device):
40.4 MGY
Number of Acquired Spot Images: 0

[t abdomen supine (1 of 2)]
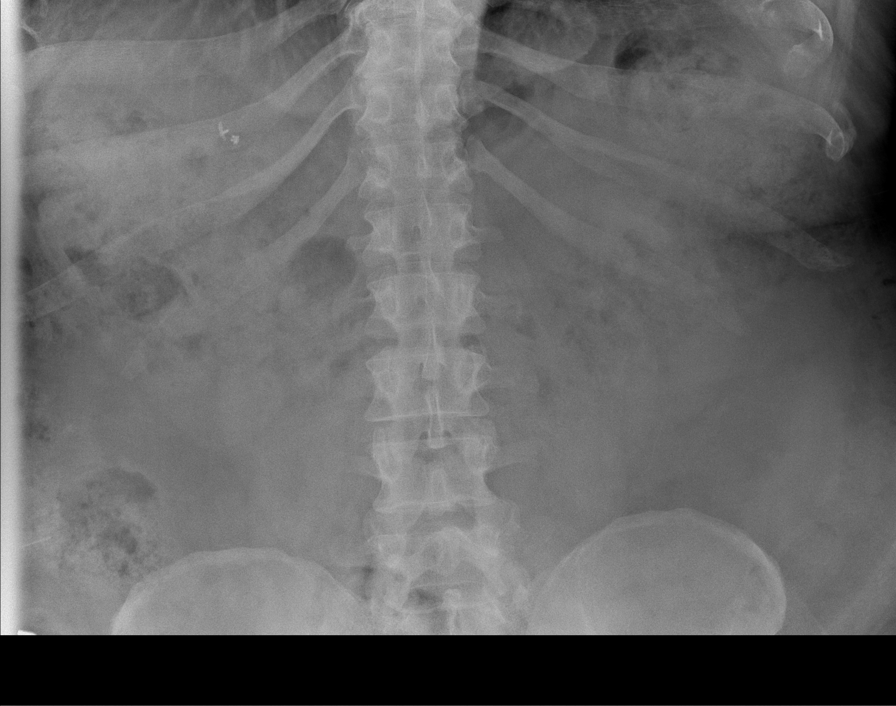

[t abdomen supine (2 of 2)]
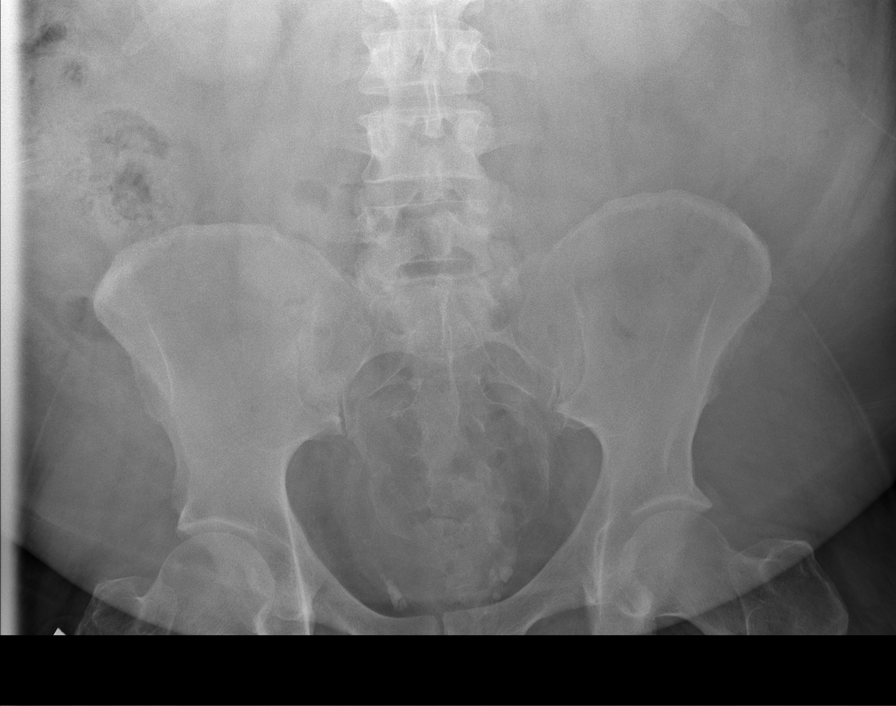

[cp_standard (1 of 4)]
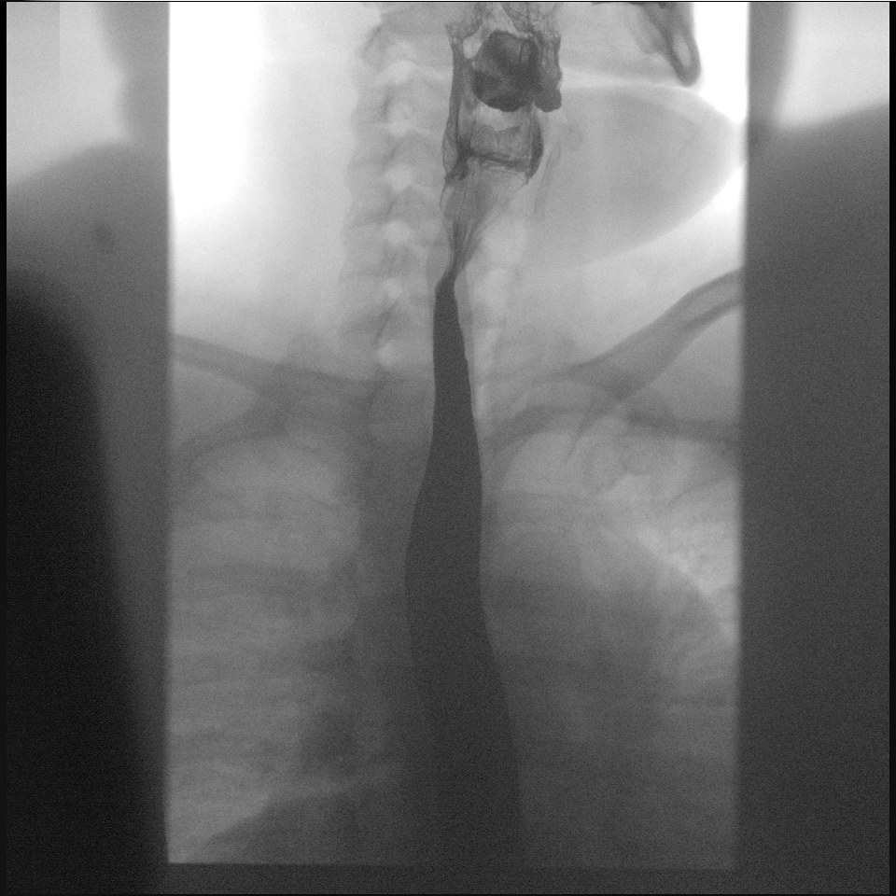

[cp_standard (2 of 4)]
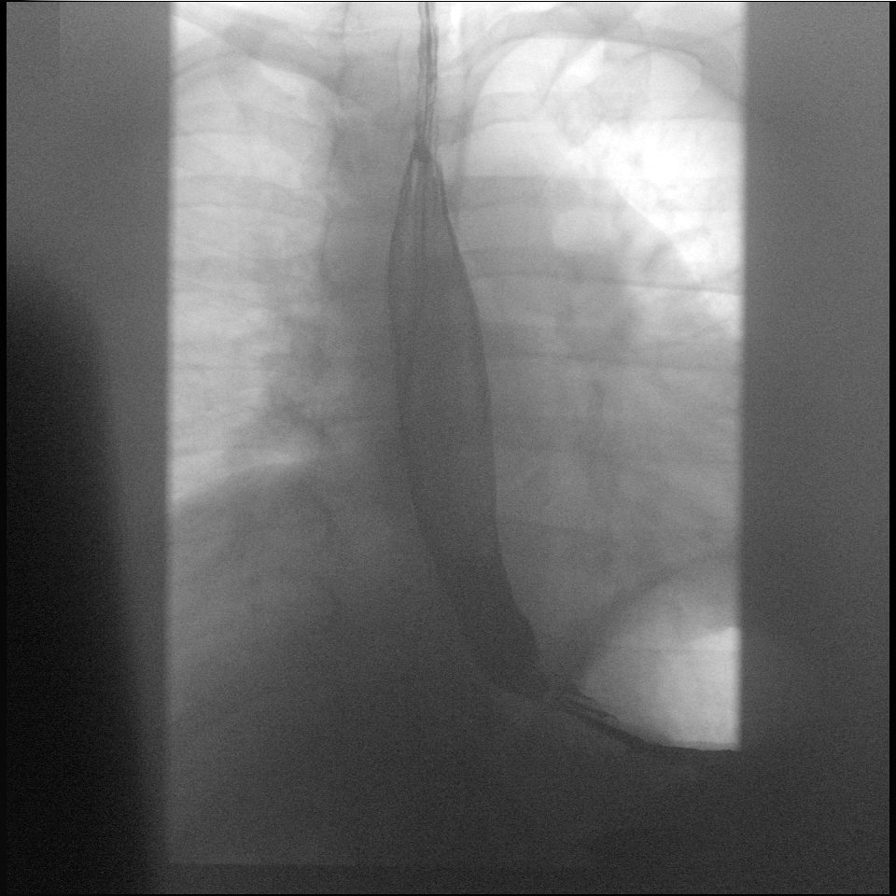

[cp_standard (3 of 4)]
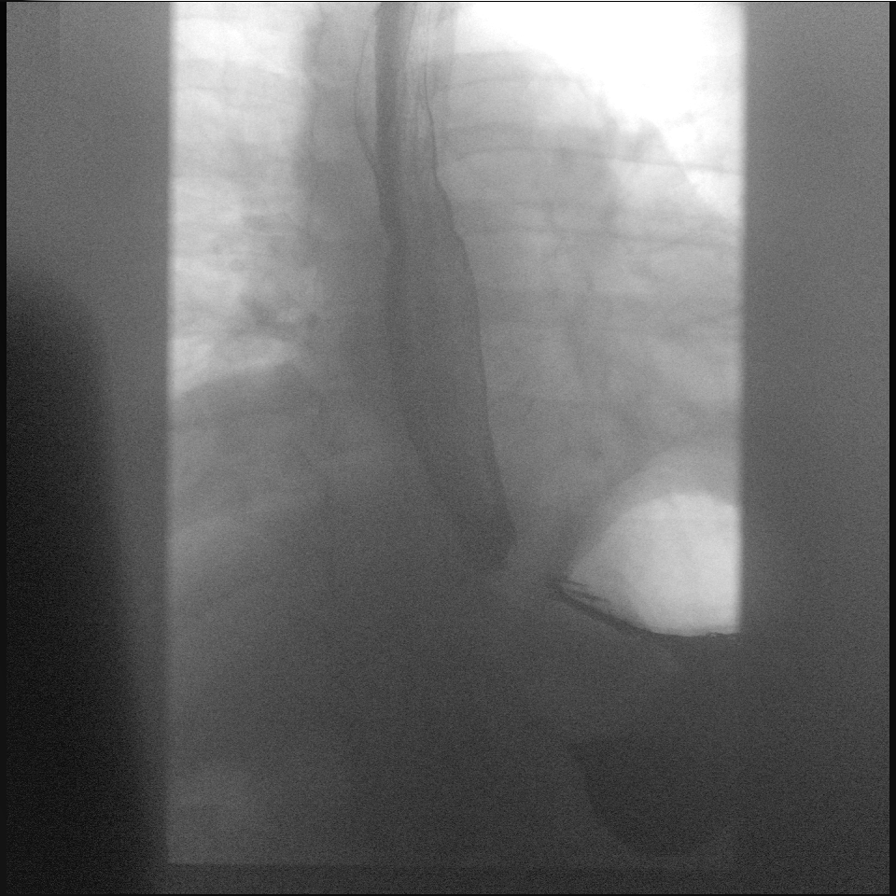

[fluoro_barium 2fps_bw]
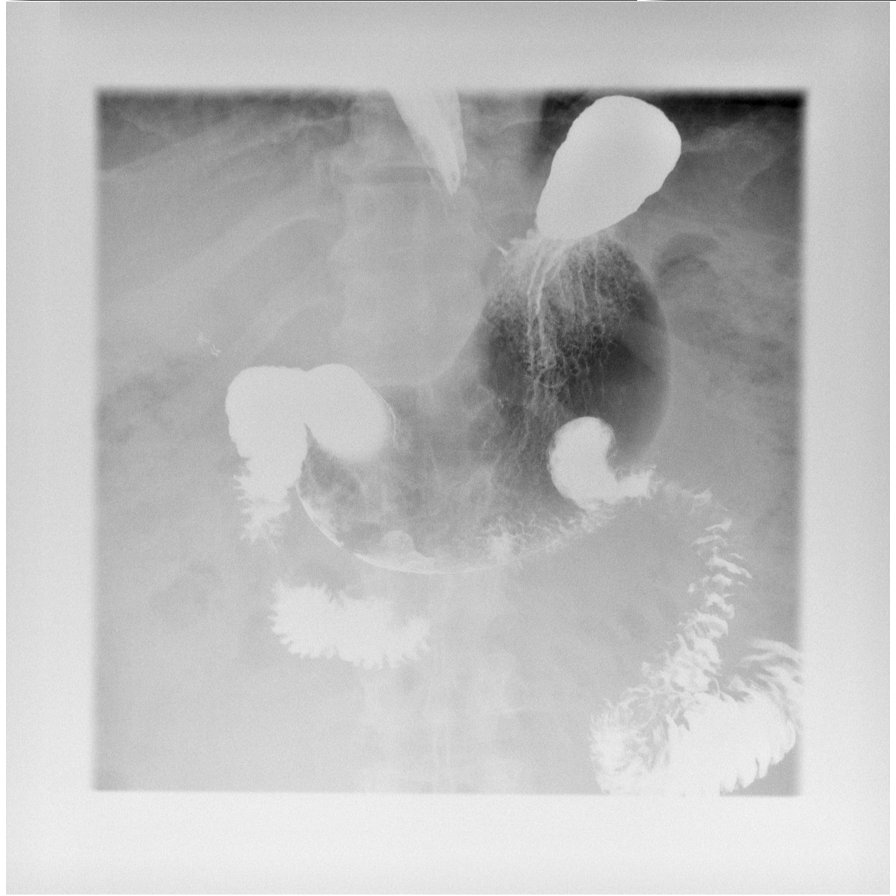

[cp_standard (4 of 4)]
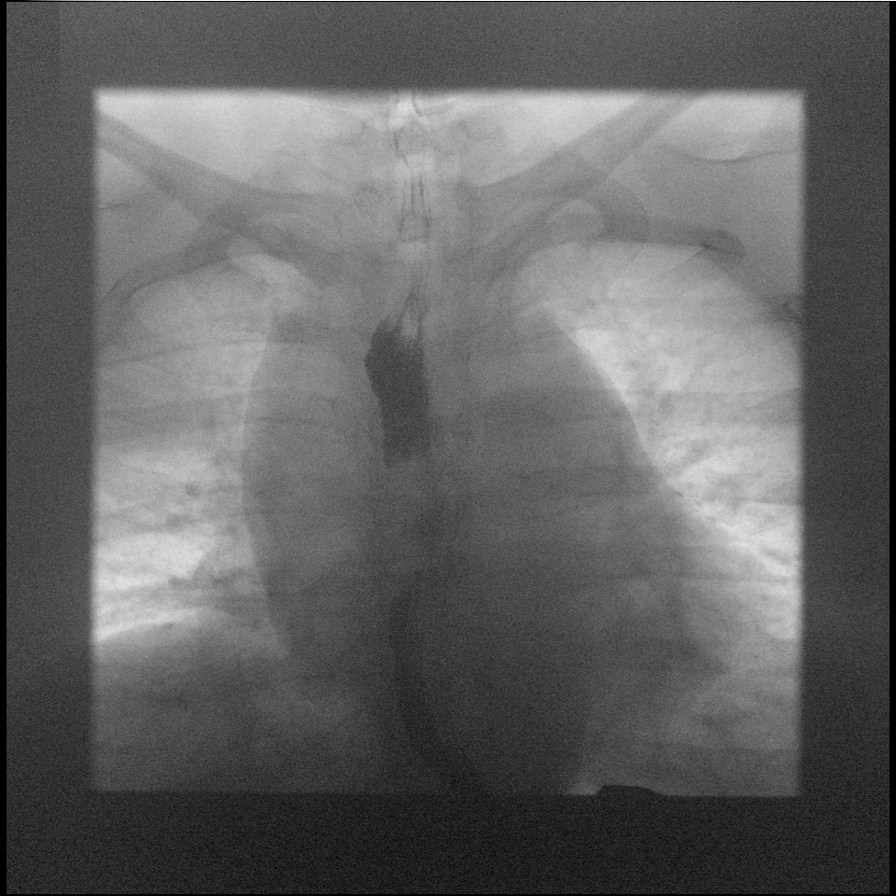

[7 of 17 positions shown; findings below may reference images not displayed]

FINDINGS: KUB:

No bowel dilatation to suggest obstruction. No evidence of
pneumoperitoneum, portal venous gas or pneumatosis.

No pathologic calcifications along the expected course of the
ureters.

No acute osseous abnormality.

UPPER GI SERIES:

Examination of the esophagus demonstrated normal esophageal
motility. Normal esophageal morphology without evidence of
esophagitis or ulceration. No esophageal stricture, diverticula, or
mass lesion. No evidence of hiatal hernia. Severe gastroesophageal
reflux extending to the thoracic inlet.

Examination of the stomach demonstrated normal rugal folds and areae
gastricae. Gastric mucosa appeared unremarkable without evidence of
ulceration, scarring, or mass lesion. Gastric motility and emptying
was normal. Fluoroscopic examination of the duodenum demonstrates
normal motility and morphology without evidence of ulceration or
mass lesion.
IMPRESSION: 1. Severe gastroesophageal reflux extending to the thoracic inlet.
2. Otherwise, normal upper GI.

## 2022-03-07 DIAGNOSIS — Z89512 Acquired absence of left leg below knee: Secondary | ICD-10-CM | POA: Diagnosis not present

## 2022-03-15 ENCOUNTER — Other Ambulatory Visit: Payer: Self-pay | Admitting: Nurse Practitioner

## 2022-03-15 DIAGNOSIS — E1159 Type 2 diabetes mellitus with other circulatory complications: Secondary | ICD-10-CM

## 2022-03-16 LAB — HEMOGLOBIN A1C
Hemoglobin A1C: 7.1
Hemoglobin A1C: 7.1

## 2022-03-17 ENCOUNTER — Encounter: Payer: Self-pay | Admitting: Family Medicine

## 2022-03-17 ENCOUNTER — Telehealth: Payer: Self-pay

## 2022-03-17 LAB — MICROALBUMIN / CREATININE URINE RATIO: Microalb Creat Ratio: 20

## 2022-03-17 LAB — PROTEIN / CREATININE RATIO, URINE
Albumin, U: 7.7
Creatinine, Urine: 37.8

## 2022-03-17 NOTE — Telephone Encounter (Signed)
Letter received from [facility]  Approved for Ocrevus 03/11/22-03/11/23.

## 2022-04-04 ENCOUNTER — Encounter: Payer: Self-pay | Admitting: Family Medicine

## 2022-04-04 ENCOUNTER — Ambulatory Visit (INDEPENDENT_AMBULATORY_CARE_PROVIDER_SITE_OTHER): Payer: Medicaid Other | Admitting: Family Medicine

## 2022-04-04 VITALS — BP 122/86 | HR 92 | Resp 18 | Ht 72.0 in | Wt 326.0 lb

## 2022-04-04 DIAGNOSIS — I152 Hypertension secondary to endocrine disorders: Secondary | ICD-10-CM

## 2022-04-04 DIAGNOSIS — E1065 Type 1 diabetes mellitus with hyperglycemia: Secondary | ICD-10-CM | POA: Diagnosis not present

## 2022-04-04 DIAGNOSIS — E1069 Type 1 diabetes mellitus with other specified complication: Secondary | ICD-10-CM

## 2022-04-04 DIAGNOSIS — E559 Vitamin D deficiency, unspecified: Secondary | ICD-10-CM

## 2022-04-04 DIAGNOSIS — E782 Mixed hyperlipidemia: Secondary | ICD-10-CM

## 2022-04-04 MED ORDER — BLOOD PRESSURE MONITOR/L CUFF MISC: 0 refills | Status: AC

## 2022-04-04 NOTE — Patient Instructions (Addendum)
Call the number on the back of your insurance card to ask how to get a blood pressure cuff covered by Medicaid. We are sending the prescription in as they direct [place] to.

## 2022-04-04 NOTE — Assessment & Plan Note (Signed)
Followed by endocrinology.  Due for eye exam, up-to-date on foot exam and kidney function testing.

## 2022-04-04 NOTE — Assessment & Plan Note (Addendum)
Stable. Continue current medication regimen. Will continue to monitor.  Rechecking CMP and CBC for medication monitoring with next set of labs.  Prescribed blood pressure cuff to check readings at home as patient's current cuff does not fit on his arm.

## 2022-04-04 NOTE — Assessment & Plan Note (Signed)
Last lipid panel: HDL 44, LDL 92.  Repeating lipid panel with lab work ordered today.  Continue Zetia 10 mg unless lab results indicate necessary change.  Will continue to monitor.

## 2022-04-04 NOTE — Progress Notes (Signed)
Established Patient Office Visit  Subjective   Patient ID: [name], male    DOB: November 14, 1975  Age: 47 y.o. MRN: [id number]  Chief Complaint  Patient presents with   Medical Management of Chronic Issues   Diabetes   Hyperlipidemia   Hypertension    HPI Michael Hutchinson is a 47 y.o. male presenting today for follow up of hypertension, hyperlipidemia, diabetes. Hypertension: Patient here for follow-up of elevated blood pressure.  Pt denies chest pain, SOB, dizziness, edema, syncope, fatigue or heart palpitations. Taking amlodipine, losartan, metoprolol, reports excellent compliance with treatment. Denies side effects. Hyperlipidemia: tolerating Zetia well with no myalgias or significant side effects.  The 10-year ASCVD risk score (Arnett DK, et al., 2019) is: 2.8% Diabetes: All diabetes related care is managed by [facility].  Denies hypoglycemic events, wounds or sores that are not healing well, increased thirst or urination. Denies vision problems, eye exam due, patient aware. Taking insulin lispro as prescribed without any side effects. Has been following low-carb diet.   ROS Negative unless otherwise noted in HPI   Objective:     BP 122/86 (BP Location: Right Arm, Patient Position: Sitting, Cuff Size: Large)   Pulse 92   Resp 18   Ht 6' (1.829 m)   Wt (!) 326 lb (147.9 kg)   SpO2 98%   BMI 44.21 kg/m   Physical Exam Constitutional:      General: He is not in acute distress.    Appearance: Normal appearance.  HENT:     Head: Normocephalic and atraumatic.  Cardiovascular:     Rate and Rhythm: Normal rate and regular rhythm.     Pulses: Normal pulses.     Heart sounds: Normal heart sounds. No murmur heard.    No friction rub. No gallop.  Pulmonary:     Effort: Pulmonary effort is normal.     Breath sounds: Normal breath sounds.  Skin:    General: Skin is warm and dry.  Neurological:     Mental Status: He is alert and oriented to person, place,  and time.  Psychiatric:        Mood and Affect: Mood normal.     Assessment & Plan:  Type 1 diabetes mellitus with hyperglycemia ([place]) Assessment & Plan: Followed by endocrinology.  Due for eye exam, up-to-date on foot exam and kidney function testing.  Orders: -     POCT UA - Microalbumin  Hypertension associated with diabetes ([place]) Assessment & Plan: Stable. Continue current medication regimen. Will continue to monitor.  Rechecking CMP and CBC for medication monitoring with next set of labs.  Prescribed blood pressure cuff to check readings at home as patient's current cuff does not fit on his arm.  Orders: -     Blood Pressure Monitor/L Cuff; Use blood pressure cuff and monitor to check blood pressure at home at least several times per week if not daily.  Dispense: 1 each; Refill: 0 -     CBC with Differential/Platelet; Future -     Comprehensive metabolic panel; Future  Mixed diabetic hyperlipidemia associated with type 1 diabetes mellitus ([place]) Assessment & Plan: Last lipid panel: HDL 44, LDL 92.  Repeating lipid panel with lab work ordered today.  Continue Zetia 10 mg unless lab results indicate necessary change.  Will continue to monitor.  Orders: -     Lipid panel; Future  Vitamin D deficiency -     VITAMIN D 25 Hydroxy (Vit-D Deficiency, Fractures); Future  Patient will come back 1 morning in the next week to have labs drawn.  Return in about 4 months (around 08/04/2022) for follow-up, fasting blood work 1 week before.    Velva Harman, PA

## 2022-04-22 ENCOUNTER — Other Ambulatory Visit: Payer: Self-pay | Admitting: Family Medicine

## 2022-04-22 DIAGNOSIS — I152 Hypertension secondary to endocrine disorders: Secondary | ICD-10-CM

## 2022-04-28 ENCOUNTER — Encounter: Payer: Self-pay | Admitting: Family Medicine

## 2022-04-28 ENCOUNTER — Ambulatory Visit (INDEPENDENT_AMBULATORY_CARE_PROVIDER_SITE_OTHER): Payer: Medicaid Other | Admitting: Family Medicine

## 2022-04-28 VITALS — BP 144/86 | HR 108 | Temp 98.1°F | Ht 72.0 in | Wt 323.0 lb

## 2022-04-28 DIAGNOSIS — R051 Acute cough: Secondary | ICD-10-CM | POA: Diagnosis not present

## 2022-04-28 MED ORDER — AZITHROMYCIN 500 MG PO TABS: 500.0000 mg | ORAL_TABLET | Freq: Every day | ORAL | 0 refills | Status: AC

## 2022-04-28 MED ORDER — AMOXICILLIN-POT CLAVULANATE 875-125 MG PO TABS: 1.0000 | ORAL_TABLET | Freq: Two times a day (BID) | ORAL | 0 refills | Status: AC

## 2022-04-28 NOTE — Assessment & Plan Note (Signed)
2 days of worsening dry cough, post tussive gagging, burning sensation in lungs with inhalation, diarrhea but no vomiting. Father in law with similar symptoms.  Would favor viral cause but d/t his MS and immunocompromising medications will treat with Abx in addition to testing for flu and covid.  Lung exam and HEENT exam are benign.  Mildly tachycardic, no hypoxia.   Advised on reasons to seek the emergency department.

## 2022-04-28 NOTE — Patient Instructions (Signed)
I have sent in a prescription for antibiotics. Take them for 5 days.    We will test you for covid and flu. We will calll you with the results.    If you develop fevers while on the antibiotic please go see the Emergency phsyician.    Have a great day,   Dr. Constance Goltz

## 2022-04-28 NOTE — Progress Notes (Signed)
   Acute Office Visit  Subjective:     Patient ID: Michael Hutchinson, male    DOB: 04-20-1975, 47 y.o.   MRN: [id number]  Chief Complaint  Patient presents with   Cough    Patient states that for the past 2 days he has had worsening nonproductive cough and burning sensation when he inhales.  This burning sensation in the lungs bilaterally.  Has a "pain" when he breathes out but not burning.  No wheezing.  No history of smoking.  Without vomiting.  Also having diarrhea.  Father-in-law had similar symptoms prior to this.  FIL is awaiting his COVID and flu test.  Patient has multiple sclerosis for which she takes a infusion twice a year.  Patient also complains that his "sinuses were burning yesterday" with a sinus pain behind his eyes.    ROS      Objective:    BP (!) 144/86   Pulse (!) 108   Temp 98.1 F (36.7 C) (Oral)   Ht 6' (1.829 m)   Wt (!) 323 lb (146.5 kg)   SpO2 100%   BMI 43.81 kg/m    Physical Exam General: Alert, oriented.  No acute distress CV: Tachycardic rate, regular rhythm Pulmonary: Lungs clear bilaterally no wheezes or crackles. GI: Soft, nontender. MSK: Left BKA  No results found for any visits on 04/28/22.      Assessment & Plan:   Problem List Items Addressed This Visit       Other   Acute cough - Primary    2 days of worsening dry cough, post tussive gagging, burning sensation in lungs with inhalation, diarrhea but no vomiting. Father in law with similar symptoms.  Would favor viral cause but d/t his MS and immunocompromising medications will treat with Abx in addition to testing for flu and covid.  Lung exam and HEENT exam are benign.  Mildly tachycardic, no hypoxia.   Advised on reasons to seek the emergency department.       Relevant Orders   COVID-19, Flu A+B and RSV    Meds ordered this encounter  Medications   amoxicillin-clavulanate (AUGMENTIN) 875-125 MG tablet    Sig: Take 1 tablet by mouth 2 (two) times daily for 5 days.     Dispense:  10 tablet    Refill:  0   azithromycin (ZITHROMAX) 500 MG tablet    Sig: Take 1 tablet (500 mg total) by mouth daily for 5 days.    Dispense:  5 tablet    Refill:  0    Return if symptoms worsen or fail to improve.  Sandre Kitty, MD

## 2022-04-30 LAB — COVID-19, FLU A+B AND RSV
Influenza A, NAA: NOT DETECTED
Influenza B, NAA: NOT DETECTED
RSV, NAA: NOT DETECTED
SARS-CoV-2, NAA: NOT DETECTED

## 2022-05-02 ENCOUNTER — Telehealth: Payer: Self-pay

## 2022-05-02 NOTE — Telephone Encounter (Signed)
Yes please I would like to see him.  In the meantime though, he can increase his amlodipine to 2 tablets daily for total of 10 mg each day.  Continue losartan 50 mg and metoprolol 100 mg as he has been already.

## 2022-05-02 NOTE — Telephone Encounter (Signed)
Patient called office to inform that his BP reading yesterday was 178/100, and he is currently taking 3 BP medications, patient would like to know if he needs to be seen,please advise, thanks.

## 2022-05-04 ENCOUNTER — Other Ambulatory Visit: Payer: Self-pay | Admitting: Nurse Practitioner

## 2022-05-04 DIAGNOSIS — E1159 Type 2 diabetes mellitus with other circulatory complications: Secondary | ICD-10-CM

## 2022-05-09 ENCOUNTER — Encounter: Payer: Self-pay | Admitting: Family Medicine

## 2022-05-09 ENCOUNTER — Ambulatory Visit (INDEPENDENT_AMBULATORY_CARE_PROVIDER_SITE_OTHER): Payer: Medicaid Other | Admitting: Family Medicine

## 2022-05-09 VITALS — BP 139/82 | HR 102 | Resp 18 | Ht 72.0 in | Wt 323.0 lb

## 2022-05-09 DIAGNOSIS — I152 Hypertension secondary to endocrine disorders: Secondary | ICD-10-CM | POA: Diagnosis not present

## 2022-05-09 DIAGNOSIS — E1159 Type 2 diabetes mellitus with other circulatory complications: Secondary | ICD-10-CM

## 2022-05-09 NOTE — Patient Instructions (Addendum)
Take your losartan, metoprolol, and one of the amlodipine pills tomorrow as you normally do before your blood pressure check visit.  If it is above your goal, then I will have you start taking 2 amlodipine daily.  If it is at the goal, then you can continue with the routine that you have been doing previously.  If you search online, look for an extra-large blood pressure cuff.  I did find one on [employer] and I am sure that if you went to somewhere like [facility] you would be able to find one as well.  The one that I found on [place] is $17.  [employer]: ELERA Extra Large Blood Pressure Cuff (9"-24"  22-60cm) - XL Replacement BP Cuff for [place], Compatible with [employer] BP Monitors, Adult Cuff Only - 6 Connectors : Health & Household

## 2022-05-09 NOTE — Assessment & Plan Note (Signed)
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  Patient returning tomorrow for nurse visit blood pressure check after returning to his regular medication routine of losartan 50 mg daily, amlodipine 5 mg daily and metoprolol 100 mg daily.  If still at goal, continue routine as is.  If blood pressure above goal, increase amlodipine to 10 mg daily.  Will continue to monitor.

## 2022-05-09 NOTE — Progress Notes (Signed)
   Acute Office Visit  Subjective:     Patient ID: Michael Hutchinson, male    DOB: [birth year], 47 y.o.   MRN: [id number]  Chief Complaint  Patient presents with   Hypertension    HPI Patient is in today for blood pressure concerns.  He called the office on 05/02/2022 stating that his blood pressure reading was 178/100.  Prior to when he was able to get an appointment, instructed to continue losartan 50 mg and metoprolol 100 mg as directed and to increase amlodipine to 10 mg daily.  The blood pressure cuff that he got with the insurance provided monitor and cuff is a large and he needs an extra-large.  In the office today, home blood pressure cuff which only fits on his forearm showed a reading of 174/101, in office properly sized cuff showed 139/82.  Review of Systems  Constitutional:  Negative for diaphoresis and malaise/fatigue.  HENT:  Negative for hearing loss.   Eyes:  Negative for blurred vision, double vision and photophobia.  Respiratory:  Negative for shortness of breath.   Cardiovascular:  Negative for chest pain and palpitations.  Neurological:  Negative for dizziness and headaches.     Objective:    BP 139/82   Pulse (!) 102   Resp 18   Ht 6' (1.829 m)   Wt (!) 323 lb (146.5 kg)   SpO2 98%   BMI 43.81 kg/m   Physical Exam Constitutional:      General: He is not in acute distress.    Appearance: Normal appearance.  HENT:     Head: Normocephalic and atraumatic.  Cardiovascular:     Rate and Rhythm: Normal rate and regular rhythm.     Heart sounds: Normal heart sounds. No murmur heard.    No friction rub. No gallop.  Pulmonary:     Effort: Pulmonary effort is normal.     Breath sounds: Normal breath sounds. No wheezing, rhonchi or rales.  Skin:    General: Skin is warm and dry.  Neurological:     Mental Status: He is alert and oriented to person, place, and time.  Psychiatric:        Mood and Affect: Mood normal.      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  Patient returning tomorrow for nurse visit blood pressure check after returning to his regular medication routine of losartan 50 mg daily, amlodipine 5 mg daily and metoprolol 100 mg daily.  If still at goal, continue routine as is.  If blood pressure above goal, increase amlodipine to 10 mg daily.  Will continue to monitor.   Return in about 1 day (around 05/10/2022) for BP check nurse visit.  Melida Quitter, PA

## 2022-05-10 ENCOUNTER — Ambulatory Visit (INDEPENDENT_AMBULATORY_CARE_PROVIDER_SITE_OTHER): Payer: Medicaid Other | Admitting: Family Medicine

## 2022-05-10 VITALS — BP 104/74 | HR 105 | Ht 72.0 in | Wt 323.0 lb

## 2022-05-10 DIAGNOSIS — I10 Essential (primary) hypertension: Secondary | ICD-10-CM

## 2022-05-10 NOTE — Progress Notes (Signed)
Pt denies CP, SOB, dizziness, or heart palpitations. taking meds as directed without problems. Denies med side effects. 5 min spent with pt. 

## 2022-06-07 ENCOUNTER — Other Ambulatory Visit: Payer: Self-pay | Admitting: Family Medicine

## 2022-06-07 DIAGNOSIS — E1159 Type 2 diabetes mellitus with other circulatory complications: Secondary | ICD-10-CM

## 2022-07-07 ENCOUNTER — Other Ambulatory Visit: Payer: Self-pay | Admitting: Family Medicine

## 2022-07-07 DIAGNOSIS — I152 Hypertension secondary to endocrine disorders: Secondary | ICD-10-CM

## 2022-07-27 ENCOUNTER — Other Ambulatory Visit: Payer: Medicaid Other

## 2022-07-29 ENCOUNTER — Other Ambulatory Visit: Payer: Medicaid Other

## 2022-08-01 ENCOUNTER — Other Ambulatory Visit: Payer: Self-pay | Admitting: Nurse Practitioner

## 2022-08-01 DIAGNOSIS — I152 Hypertension secondary to endocrine disorders: Secondary | ICD-10-CM

## 2022-08-04 ENCOUNTER — Ambulatory Visit: Payer: Medicaid Other | Admitting: Family Medicine

## 2022-08-20 ENCOUNTER — Telehealth: Payer: Medicaid Other | Admitting: Family Medicine

## 2022-08-20 DIAGNOSIS — H9201 Otalgia, right ear: Secondary | ICD-10-CM

## 2022-08-20 MED ORDER — AMOXICILLIN-POT CLAVULANATE 875-125 MG PO TABS: 1.0000 | ORAL_TABLET | Freq: Two times a day (BID) | ORAL | 0 refills | Status: DC

## 2022-08-20 NOTE — Progress Notes (Signed)

## 2022-09-26 ENCOUNTER — Other Ambulatory Visit: Payer: Medicaid Other

## 2022-09-26 DIAGNOSIS — E559 Vitamin D deficiency, unspecified: Secondary | ICD-10-CM

## 2022-09-26 DIAGNOSIS — E782 Mixed hyperlipidemia: Secondary | ICD-10-CM

## 2022-09-26 DIAGNOSIS — I152 Hypertension secondary to endocrine disorders: Secondary | ICD-10-CM

## 2022-09-27 LAB — COMPREHENSIVE METABOLIC PANEL
ALT: 48 IU/L — ABNORMAL HIGH (ref 0–44)
AST: 44 IU/L — ABNORMAL HIGH (ref 0–40)
Albumin: 3.9 g/dL — ABNORMAL LOW (ref 4.1–5.1)
Alkaline Phosphatase: 112 IU/L (ref 44–121)
BUN/Creatinine Ratio: 4 — ABNORMAL LOW (ref 9–20)
BUN: 4 mg/dL — ABNORMAL LOW (ref 6–24)
Bilirubin Total: 0.6 mg/dL (ref 0.0–1.2)
CO2: 26 mmol/L (ref 20–29)
Calcium: 9.1 mg/dL (ref 8.7–10.2)
Chloride: 98 mmol/L (ref 96–106)
Creatinine, Ser: 0.9 mg/dL (ref 0.76–1.27)
Globulin, Total: 2.4 g/dL (ref 1.5–4.5)
Glucose: 85 mg/dL (ref 70–99)
Potassium: 4.4 mmol/L (ref 3.5–5.2)
Sodium: 139 mmol/L (ref 134–144)
Total Protein: 6.3 g/dL (ref 6.0–8.5)
eGFR: 107 mL/min/{1.73_m2} (ref >59)

## 2022-09-27 LAB — CBC WITH DIFFERENTIAL/PLATELET
Basophils Absolute: 0 10*3/uL (ref 0.0–0.2)
Basos: 1 %
EOS (ABSOLUTE): 0.1 10*3/uL (ref 0.0–0.4)
Eos: 2 %
Hematocrit: 39.5 % (ref 37.5–51.0)
Hemoglobin: 13.1 g/dL (ref 13.0–17.7)
Immature Grans (Abs): 0 10*3/uL (ref 0.0–0.1)
Immature Granulocytes: 1 %
Lymphocytes Absolute: 0.6 10*3/uL — ABNORMAL LOW (ref 0.7–3.1)
Lymphs: 14 %
MCH: 32.6 pg (ref 26.6–33.0)
MCHC: 33.2 g/dL (ref 31.5–35.7)
MCV: 98 fL — ABNORMAL HIGH (ref 79–97)
Monocytes Absolute: 0.7 10*3/uL (ref 0.1–0.9)
Monocytes: 16 %
Neutrophils Absolute: 2.9 10*3/uL (ref 1.4–7.0)
Neutrophils: 66 %
Platelets: 234 10*3/uL (ref 150–450)
RBC: 4.02 x10E6/uL — ABNORMAL LOW (ref 4.14–5.80)
RDW: 12.4 % (ref 11.6–15.4)
WBC: 4.3 10*3/uL (ref 3.4–10.8)

## 2022-09-27 LAB — LIPID PANEL
Chol/HDL Ratio: 3.6 ratio (ref 0.0–5.0)
Cholesterol, Total: 215 mg/dL — ABNORMAL HIGH (ref 100–199)
HDL: 59 mg/dL (ref >39)
LDL Chol Calc (NIH): 124 mg/dL — ABNORMAL HIGH (ref 0–99)
Triglycerides: 181 mg/dL — ABNORMAL HIGH (ref 0–149)
VLDL Cholesterol Cal: 32 mg/dL (ref 5–40)

## 2022-09-27 LAB — VITAMIN D 25 HYDROXY (VIT D DEFICIENCY, FRACTURES): Vit D, 25-Hydroxy: 26 ng/mL — ABNORMAL LOW (ref 30.0–100.0)

## 2022-10-04 ENCOUNTER — Ambulatory Visit (INDEPENDENT_AMBULATORY_CARE_PROVIDER_SITE_OTHER): Payer: Medicaid Other | Admitting: Family Medicine

## 2022-10-04 ENCOUNTER — Encounter: Payer: Self-pay | Admitting: Family Medicine

## 2022-10-04 VITALS — BP 114/81 | HR 84 | Resp 18 | Ht 72.0 in | Wt 321.0 lb

## 2022-10-04 DIAGNOSIS — I152 Hypertension secondary to endocrine disorders: Secondary | ICD-10-CM

## 2022-10-04 DIAGNOSIS — E1065 Type 1 diabetes mellitus with hyperglycemia: Secondary | ICD-10-CM | POA: Diagnosis not present

## 2022-10-04 DIAGNOSIS — Z1211 Encounter for screening for malignant neoplasm of colon: Secondary | ICD-10-CM

## 2022-10-04 DIAGNOSIS — Z1212 Encounter for screening for malignant neoplasm of rectum: Secondary | ICD-10-CM

## 2022-10-04 DIAGNOSIS — E1069 Type 1 diabetes mellitus with other specified complication: Secondary | ICD-10-CM | POA: Diagnosis not present

## 2022-10-04 DIAGNOSIS — E1159 Type 2 diabetes mellitus with other circulatory complications: Secondary | ICD-10-CM

## 2022-10-04 DIAGNOSIS — Z794 Long term (current) use of insulin: Secondary | ICD-10-CM

## 2022-10-04 DIAGNOSIS — E782 Mixed hyperlipidemia: Secondary | ICD-10-CM

## 2022-10-04 LAB — POCT GLYCOSYLATED HEMOGLOBIN (HGB A1C): HbA1c POC (<> result, manual entry): 7.6 % (ref 4.0–5.6)

## 2022-10-04 MED ORDER — AMLODIPINE BESYLATE 5 MG PO TABS
5.0000 mg | ORAL_TABLET | Freq: Every day | ORAL | 3 refills | Status: DC
Start: 2022-10-04 — End: 2023-10-30

## 2022-10-04 MED ORDER — METOPROLOL SUCCINATE ER 100 MG PO TB24
100.0000 mg | ORAL_TABLET | Freq: Every day | ORAL | 1 refills | Status: DC
Start: 2022-10-04 — End: 2023-07-03

## 2022-10-04 MED ORDER — LOSARTAN POTASSIUM 50 MG PO TABS: ORAL_TABLET | ORAL | 1 refills | Status: DC

## 2022-10-04 MED ORDER — EZETIMIBE 10 MG PO TABS: 10.0000 mg | ORAL_TABLET | Freq: Every day | ORAL | 3 refills | Status: DC

## 2022-10-04 NOTE — Progress Notes (Signed)
Established Patient Office Visit  Subjective   Patient ID: Michael Hutchinson, male    DOB: [birth year]  Age: 47 y.o. MRN: [id number]  Chief Complaint  Patient presents with   Diabetes   Hypertension    HPI Michael Hutchinson is a 47 y.o. male presenting today for follow up of hypertension, hyperlipidemia, diabetes. Hypertension: Patient here for follow-up of elevated blood pressure. Pt denies chest pain, SOB, dizziness, edema, syncope, fatigue or heart palpitations. Taking amlodipine, losartan, metoprolol, reports excellent compliance with treatment. Denies side effects. Hyperlipidemia: tolerating ezetimibe well with no myalgias or significant side effects.  The 10-year ASCVD risk score (Arnett DK, et al., 2019) is: 3.4% Diabetes: denies hypoglycemic events, wounds or sores that are not healing well, increased thirst or urination. Denies vision problems, eye exam due. Taking insulin as prescribed without any side effects.  He is not surprised that his A1c has increased.  He states that the past few months, he has been eating more and drinking more alcohol than he knows he should, but he has cut back on both.  He has not followed up with endocrinology since his appointment with them in March.  He has been having issues with his insulin pump, the sensors do not always stick as they should.  He has also had MS flares and has needed steroids.  He is requesting a referral to a new endocrinologist that will be a better fit for him.  Outpatient Medications Prior to Visit  Medication Sig   Insulin Pen Needle (PEN NEEDLES) 32G X 4 MM MISC USE 5 TIMES DAILY AS DIRECTED   [DISCONTINUED] amLODipine (NORVASC) 5 MG tablet TAKE 1 TABLET BY MOUTH DAILY.    Blood Pressure Monitoring (BLOOD PRESSURE MONITOR/L CUFF) MISC Use blood pressure cuff and monitor to check blood pressure at home at least several times per week if not daily.   Continuous Blood Gluc Receiver (DEXCOM G6 RECEIVER) DEVI See admin instructions.    Continuous Blood Gluc Sensor (DEXCOM G6 SENSOR) MISC SMARTSIG:1 Topical Every 10 Days   Continuous Blood Gluc Transmit (DEXCOM G6 TRANSMITTER) MISC DISPENSE AND USE AS DIRECTED   glucose blood (BAYER CONTOUR NEXT TEST) test strip 1 each by Other route 4 (four) times daily. And lancets 4/day   HYDROcodone-acetaminophen (NORCO/VICODIN) 5-325 MG tablet Take 1 tablet by mouth every 4 (four) hours as needed.   Insulin Disposable Pump (OMNIPOD 5 G6 PODS, GEN 5,) MISC Inject 1 Device into the skin daily.   insulin lispro (HUMALOG) 100 UNIT/ML injection Inject 60-70 Units into the skin daily. Use via insulin pump   Insulin Syringes, Disposable, U-100 1 ML MISC 3x daily   Multiple Vitamins-Minerals (MULTIVITAMIN WITH MINERALS) tablet Take 1 tablet by mouth daily.   omeprazole (PRILOSEC OTC) 20 MG tablet Take 20 mg by mouth every other day.   rizatriptan (MAXALT-MLT) 10 MG disintegrating tablet Take 1 tablet earliest onset of headache.  May repeat after 2 hours if needed. Maximum 2 tablets in 24 hours   [DISCONTINUED] amoxicillin-clavulanate (AUGMENTIN) 875-125 MG tablet Take 1 tablet by mouth 2 (two) times daily.   [DISCONTINUED] ezetimibe (ZETIA) 10 MG tablet Take 10 mg by mouth daily.   [DISCONTINUED] losartan (COZAAR) 50 MG tablet TAKE 1 TABLET BY MOUTH ONCE DAILY   [DISCONTINUED] metoprolol succinate (TOPROL-XL) 100 MG 24 hr tablet TAKE 1 TABLET (100 MG TOTAL) BY MOUTH DAILY. TAKE WITH OR IMMEDIATELY FOLLOWING A MEAL.   No facility-administered medications prior to visit.   ROS Negative unless  otherwise noted in HPI   Objective:     BP 114/81 (BP Location: Left Arm, Patient Position: Sitting, Cuff Size: Large)   Pulse 84   Resp 18   Ht 6' (1.829 m)   Wt (!) 321 lb (145.6 kg)   SpO2 99%   BMI 43.54 kg/m   Physical Exam Constitutional:      General: He is not in acute distress.    Appearance: Normal appearance.  HENT:     Head: Normocephalic and atraumatic.  Cardiovascular:     Rate  and Rhythm: Normal rate and regular rhythm.     Heart sounds: Normal heart sounds. No murmur heard.    No friction rub. No gallop.  Pulmonary:     Effort: Pulmonary effort is normal. No respiratory distress.     Breath sounds: Normal breath sounds. No wheezing, rhonchi or rales.  Skin:    General: Skin is warm and dry.  Neurological:     Mental Status: He is alert and oriented to person, place, and time.  Psychiatric:        Mood and Affect: Mood normal.    Results for orders placed or performed in visit on 10/04/22  Hemoglobin A1c  Result Value Ref Range   Hemoglobin A1C 7.1   POCT glycosylated hemoglobin (Hb A1C)  Result Value Ref Range   Hemoglobin A1C     HbA1c POC (<> result, manual entry) 7.6 4.0 - 5.6 %   HbA1c, POC (prediabetic range)     HbA1c, POC (controlled diabetic range)       Assessment & Plan:  Hypertension associated with diabetes (HCC) Assessment & Plan: BP goal <130/80.  Essentially at goal.  Continue amlodipine 5 mg daily, losartan 50 mg daily, metoprolol succinate 100 mg daily.  Continue ambulatory blood pressure monitoring.  CMP revealed decreased BUN and BUN/creatinine ratio, decreased albumin, increased AST and ALT.  Suspect that this is likely secondary to poor blood sugar control.  Will recheck CMP in 3 months.  Orders: -     amLODIPine Besylate; Take 1 tablet (5 mg total) by mouth daily.  Dispense: 90 tablet; Refill: 3 -     Losartan Potassium; TAKE 1 TABLET BY MOUTH ONCE DAILY  Dispense: 90 tablet; Refill: 1 -     Metoprolol Succinate ER; Take 1 tablet (100 mg total) by mouth daily. TAKE WITH OR IMMEDIATELY FOLLOWING A MEAL.  Dispense: 90 tablet; Refill: 1 -     CBC with Differential/Platelet; Future -     Comprehensive metabolic panel; Future  Mixed diabetic hyperlipidemia associated with type 1 diabetes mellitus (HCC) Assessment & Plan: Last lipid panel: LDL 124, HDL 59, triglycerides 181.  Suspect increase in LDL and triglycerides likely  secondary to increased blood sugar levels and increased alcohol consumption.  Patient has cut back on alcohol consumption, discussed the importance of getting blood sugars under control.  Continue Xarelto 10 mg daily.  Repeat lipid panel and CMP in 3 months and will adjust medication or refer to lipid clinic if needed.  Orders: -     Ezetimibe; Take 1 tablet (10 mg total) by mouth daily.  Dispense: 90 tablet; Refill: 3 -     Comprehensive metabolic panel; Future -     Lipid panel; Future  Type 1 diabetes mellitus with hyperglycemia [facility]) Assessment & Plan: Followed by endocrinology, last appointment in March.  Due for eye exam, up-to-date on foot exam and kidney function testing. CMP revealed decreased BUN and BUN/creatinine ratio, decreased  albumin, increased AST and ALT.A1c 7.6 today.  We discussed the importance of maintaining good glucose control, encouraged to cut back on sugar/carb consumption and be vigilant in insulin routine.  Sending referral to new endocrinologist.  Orders: -     POCT glycosylated hemoglobin (Hb A1C) -     Ambulatory referral to Endocrinology  Screening for colorectal cancer -     Cologuard  Declines flu vaccine.  Discussed colorectal cancer screening, he is agreeable to Cologuard.  Return in about 3 months (around 01/03/2023) for follow-up for HTN, HLD, DM, fasting blood work 1 week before.    Melida Quitter, PA

## 2022-10-04 NOTE — Assessment & Plan Note (Signed)
BP goal <130/80.  Essentially at goal.  Continue amlodipine 5 mg daily, losartan 50 mg daily, metoprolol succinate 100 mg daily.  Continue ambulatory blood pressure monitoring.  CMP revealed decreased BUN and BUN/creatinine ratio, decreased albumin, increased AST and ALT.  Suspect that this is likely secondary to poor blood sugar control.  Will recheck CMP in 3 months.

## 2022-10-04 NOTE — Assessment & Plan Note (Addendum)
Followed by endocrinology, last appointment in March.  Due for eye exam, up-to-date on foot exam and kidney function testing. CMP revealed decreased BUN and BUN/creatinine ratio, decreased albumin, increased AST and ALT.A1c 7.6 today.  We discussed the importance of maintaining good glucose control, encouraged to cut back on sugar/carb consumption and be vigilant in insulin routine.  Sending referral to new endocrinologist.

## 2022-10-04 NOTE — Assessment & Plan Note (Signed)
Last lipid panel: LDL 124, HDL 59, triglycerides 181.  Suspect increase in LDL and triglycerides likely secondary to increased blood sugar levels and increased alcohol consumption.  Patient has cut back on alcohol consumption, discussed the importance of getting blood sugars under control.  Continue Xarelto 10 mg daily.  Repeat lipid panel and CMP in 3 months and will adjust medication or refer to lipid clinic if needed.

## 2022-11-03 ENCOUNTER — Encounter: Payer: Self-pay | Admitting: Family Medicine

## 2022-11-03 ENCOUNTER — Ambulatory Visit: Payer: Medicaid Other | Admitting: Family Medicine

## 2022-11-03 VITALS — BP 132/89 | HR 106 | Ht 72.0 in | Wt 328.1 lb

## 2022-11-03 DIAGNOSIS — R051 Acute cough: Secondary | ICD-10-CM

## 2022-11-03 MED ORDER — AZITHROMYCIN 500 MG PO TABS: 500.0000 mg | ORAL_TABLET | Freq: Every day | ORAL | 0 refills | Status: AC

## 2022-11-03 MED ORDER — AMOXICILLIN-POT CLAVULANATE 875-125 MG PO TABS: 1.0000 | ORAL_TABLET | Freq: Two times a day (BID) | ORAL | 0 refills | Status: DC

## 2022-11-03 NOTE — Patient Instructions (Signed)
It was nice to see you today,  We addressed the following topics today: I sent on the same prescription last time Augmentin and azithromycin.  Take it for 5 days.  If symptoms do not improve please let us know.  Have a great day,  Frederic Jericho, MD

## 2022-11-03 NOTE — Assessment & Plan Note (Signed)
Patient presents today with similar symptoms to those he had in April.  At that time he responded well to antibiotics.  Cough is present but also patient has sinusitis.  Will send in Augmentin and azithromycin for 5 days, which is what he received in April.Michael Hutchinson

## 2022-11-03 NOTE — Progress Notes (Signed)
   Acute Office Visit  Subjective:     Patient ID: Michael Hutchinson, male    DOB: 12/26/1975, 47 y.o.   MRN: [id number]  Chief Complaint  Patient presents with   Facial Pain   Dizziness    HPI  Patient states that since Monday he has had similar symptoms to what he had in April.  This includes a burning sensation in his sinuses, sinus pressure, sneezing, nasal congestion, and dry cough.  Last time we gave him Augmentin azithromycin because of the medication he was taking for MS.  He states the symptoms improved after taking the antibiotics last time.  Denies fever, chills, nausea vomiting, abdominal pain.  No known sick contacts. ROS      Objective:    BP 132/89   Pulse (!) 106   Ht 6' (1.829 m)   Wt (!) 328 lb 1.9 oz (148.8 kg)   SpO2 96%   BMI 44.50 kg/m    Physical Exam General: Alert, oriented HEENT: Enlarged nasal turbinates.  Frontal sinus tenderness to palpation.  Normal oral mucosa. CV: Regular rate and rhythm Pulmonary: Lungs clear bilaterally  No results found for any visits on 11/03/22.      Assessment & Plan:   Acute cough Assessment & Plan: Patient presents today with similar symptoms to those he had in April.  At that time he responded well to antibiotics.  Cough is present but also patient has sinusitis.  Will send in Augmentin and azithromycin for 5 days, which is what he received in April..   Other orders -     Amoxicillin-Pot Clavulanate; Take 1 tablet by mouth 2 (two) times daily for 5 days.  Dispense: 10 tablet; Refill: 0 -     Azithromycin; Take 1 tablet (500 mg total) by mouth daily for 5 days.  Dispense: 5 tablet; Refill: 0     Return if symptoms worsen or fail to improve.  Sandre Kitty, MD

## 2022-11-07 ENCOUNTER — Other Ambulatory Visit: Payer: Self-pay

## 2022-11-07 ENCOUNTER — Telehealth: Payer: Self-pay | Admitting: *Deleted

## 2022-11-07 ENCOUNTER — Emergency Department
Admission: EM | Admit: 2022-11-07 | Discharge: 2022-11-07 | Disposition: A | Discharge status: Home / Self Care | Payer: Medicaid Other | Attending: Emergency Medicine | Admitting: Emergency Medicine

## 2022-11-07 DIAGNOSIS — E109 Type 1 diabetes mellitus without complications: Secondary | ICD-10-CM | POA: Diagnosis not present

## 2022-11-07 DIAGNOSIS — R197 Diarrhea, unspecified: Secondary | ICD-10-CM | POA: Insufficient documentation

## 2022-11-07 DIAGNOSIS — I10 Essential (primary) hypertension: Secondary | ICD-10-CM | POA: Insufficient documentation

## 2022-11-07 DIAGNOSIS — E860 Dehydration: Secondary | ICD-10-CM | POA: Insufficient documentation

## 2022-11-07 LAB — LIPASE, BLOOD: Lipase: 21 U/L (ref 11–51)

## 2022-11-07 LAB — COMPREHENSIVE METABOLIC PANEL
ALT: 36 U/L (ref 0–44)
AST: 51 U/L — ABNORMAL HIGH (ref 15–41)
Albumin: 3.9 g/dL (ref 3.5–5.0)
Alkaline Phosphatase: 85 U/L (ref 38–126)
Anion gap: 12 (ref 5–15)
BUN: 11 mg/dL (ref 6–20)
CO2: 25 mmol/L (ref 22–32)
Calcium: 8.7 mg/dL — ABNORMAL LOW (ref 8.9–10.3)
Chloride: 98 mmol/L (ref 98–111)
Creatinine, Ser: 0.77 mg/dL (ref 0.61–1.24)
GFR, Estimated: >60 mL/min (ref >60)
Glucose, Bld: 141 mg/dL — ABNORMAL HIGH (ref 70–99)
Potassium: 4 mmol/L (ref 3.5–5.1)
Sodium: 135 mmol/L (ref 135–145)
Total Bilirubin: 1.1 mg/dL (ref 0.3–1.2)
Total Protein: 7.1 g/dL (ref 6.5–8.1)

## 2022-11-07 LAB — CBC
HCT: 40.5 % (ref 39.0–52.0)
Hemoglobin: 13.8 g/dL (ref 13.0–17.0)
MCH: 32.6 pg (ref 26.0–34.0)
MCHC: 34.1 g/dL (ref 30.0–36.0)
MCV: 95.7 fL (ref 80.0–100.0)
Platelets: 205 10*3/uL (ref 150–400)
RBC: 4.23 MIL/uL (ref 4.22–5.81)
RDW: 13.2 % (ref 11.5–15.5)
WBC: 8.3 10*3/uL (ref 4.0–10.5)
nRBC: 0 % (ref 0.0–0.2)

## 2022-11-07 LAB — URINALYSIS, ROUTINE W REFLEX MICROSCOPIC
Bacteria, UA: NONE SEEN
Bilirubin Urine: NEGATIVE
Glucose, UA: NEGATIVE mg/dL
Hgb urine dipstick: NEGATIVE
Ketones, ur: 5 mg/dL — AB
Leukocytes,Ua: NEGATIVE
Nitrite: NEGATIVE
Protein, ur: 100 mg/dL — AB
Specific Gravity, Urine: 1.02 (ref 1.005–1.030)
pH: 8 (ref 5.0–8.0)

## 2022-11-07 LAB — MAGNESIUM: Magnesium: 1.9 mg/dL (ref 1.7–2.4)

## 2022-11-07 MED ORDER — ONDANSETRON HCL 4 MG/2ML IJ SOLN
4.0000 mg | Freq: Once | INTRAMUSCULAR | Status: AC
  Administered 2022-11-07: 4 mg via INTRAVENOUS
  Filled 2022-11-07: qty 2

## 2022-11-07 MED ORDER — SODIUM CHLORIDE 0.9 % IV BOLUS
1000.0000 mL | Freq: Once | INTRAVENOUS | Status: AC
  Administered 2022-11-07: 1000 mL via INTRAVENOUS

## 2022-11-07 NOTE — ED Notes (Signed)
Pt A&O x4, no obvious distress noted, respirations regular/unlabored. Pt verbalizes understanding of discharge instructions. Pt able to ambulate from ED independently.   

## 2022-11-07 NOTE — ED Notes (Addendum)
Pt woke up with chills last night, couldn't stop shaking and felt like "crap." Pt stated that they didn't sleep last night. Pt took tylenol around 0800 this morning and is not currently running a fever. Pt also has c/o diarrhea (about 4 times since this morning) and took antidiarrheal medicine this morning as well. Pt has a hx of MS.

## 2022-11-07 NOTE — ED Provider Notes (Signed)
[facility] Provider Note    Event Date/Time   First MD Initiated Contact with Patient 11/07/22 1023     (approximate)   History   Diarrhea   HPI  Michael Hutchinson is a 47 y.o. male   Past medical history of type I diabetes, GERD, hypertension, hyperlipidemia presents emergency department with diarrhea.  Feeling chills, shaky and lightheaded.  Denies abdominal pain or urinary symptoms.  No GI bleeding.  Of note he was prescribed Augmentin and azithromycin approximately 3 days ago for a cough.   External Medical Documents Reviewed: Office visit dated 11/04/2022 documenting a cough for which she was treated with Augmentin and azithromycin prescription.      Physical Exam   Triage Vital Signs: ED Triage Vitals  Encounter Vitals Group     BP 11/07/22 1019 (!) 155/85     Systolic BP Percentile --      Diastolic BP Percentile --      Pulse Rate 11/07/22 1019 (!) 120     Resp 11/07/22 1019 20     Temp 11/07/22 1018 98.4 F (36.9 C)     Temp src --      SpO2 11/07/22 1019 96 %     Weight 11/07/22 1019 (!) 326 lb 4.5 oz (148 kg)     Height 11/07/22 1019 6' (1.829 m)     Head Circumference --      Peak Flow --      Pain Score 11/07/22 1019 2     Pain Loc --      Pain Education --      Exclude from Growth Chart --     Most recent vital signs: Vitals:   11/07/22 1230 11/07/22 1300  BP: (!) 159/88 (!) 156/81  Pulse: (!) 112 (!) 103  Resp: 19 (!) 24  Temp:    SpO2: 97% 97%    General: Awake, no distress.  CV:  Good peripheral perfusion.  Resp:  Normal effort.  Abd:  No distention.  Other:  Tachycardic hypertensive, soft nontender abdomen, appears slightly dehydrated with dry mucous membranes.   ED Results / Procedures / Treatments   Labs (all labs ordered are listed, but only abnormal results are displayed) Labs Reviewed  COMPREHENSIVE METABOLIC PANEL - Abnormal; Notable for the following components:      Result Value   Glucose,  Bld 141 (*)    Calcium 8.7 (*)    AST 51 (*)    All other components within normal limits  URINALYSIS, ROUTINE W REFLEX MICROSCOPIC - Abnormal; Notable for the following components:   Color, Urine YELLOW (*)    APPearance CLEAR (*)    Ketones, ur 5 (*)    Protein, ur 100 (*)    All other components within normal limits  C DIFFICILE QUICK SCREEN W PCR REFLEX    GASTROINTESTINAL PANEL BY PCR, STOOL (REPLACES STOOL CULTURE)  LIPASE, BLOOD  CBC  MAGNESIUM     I ordered and reviewed the above labs they are notable for normal cell counts and electrolytes.  Urinalysis without sign of infection.     PROCEDURES:  Critical Care performed: No  Procedures   MEDICATIONS ORDERED IN ED: Medications  sodium chloride 0.9 % bolus 1,000 mL (0 mLs Intravenous Stopped 11/07/22 1220)  ondansetron (ZOFRAN) injection 4 mg (4 mg Intravenous Given 11/07/22 1037)     IMPRESSION / MDM / ASSESSMENT AND PLAN / ED COURSE  I reviewed the triage vital signs and the  nursing notes.                                Patient's presentation is most consistent with acute presentation with potential threat to life or bodily function.  Differential diagnosis includes, but is not limited to, hydration, electrolyte disturbance, colitis, infectious colitis, C. difficile colitis, adverse reaction of medication recent antibiotics, intra-abdominal infection or obstruction, DKA   The patient is on the cardiac monitor to evaluate for evidence of arrhythmia and/or significant heart rate changes.  MDM:    Profuse watery diarrhea in this patient who appears dehydrated, also type I diabetic.  Will give IV crystalloid for hydration, check electrolytes labs rule out DKA, check stool studies including C. difficile given his recent antibiotic use leading to profuse watery diarrhea.  May also be a adverse effect of this antibiotic use.  Soft nontender abdomen rules against surgical abdominal pathologies at this  time.  -- Labs unremarkable, patient without further diarrheal episodes in the emergency department.  Given his stability, no further episodes, unremarkable workup, patient will be discharged with close PMD follow-up.        FINAL CLINICAL IMPRESSION(S) / ED DIAGNOSES   Final diagnoses:  Diarrhea, unspecified type  Dehydration     Rx / DC Orders   ED Discharge Orders     None        Note:  This document was prepared using Dragon voice recognition software and may include unintentional dictation errors.    Pilar Jarvis, MD 11/07/22 [phone]

## 2022-11-07 NOTE — Telephone Encounter (Signed)
If he is that ill he should go to the emergency department.  If his diarrhea is severe he could be dehydrated.  I would advise him to hold off on taking any of his blood pressure medications until the ED can evaluate him.  The diarrhea could cause his blood pressure to become too low, especially if he is taking blood pressure medications and this could lead to an acute kidney injury.

## 2022-11-07 NOTE — ED Triage Notes (Signed)
Pt to ED for chills, diarrhea started this am.

## 2022-11-07 NOTE — Discharge Instructions (Signed)
Drink plenty of fluids to stay well-hydrated.  Find Pedialyte or similar electrolyte rehydration formulas at your local pharmacy.   Continue taking Imodium or other antidiarrheals as needed.  Call your doctor for follow-up appointment this week.  Thank you for choosing [place] for your health care today!  Please see your primary doctor this week for a follow up appointment.   If you have any new, worsening, or unexpected symptoms call your doctor right away or come back to the emergency department for reevaluation.  It was my pleasure to care for you today.   Daneil Dan Modesto Charon, MD

## 2022-11-07 NOTE — Telephone Encounter (Signed)
Pt wife calling to say that pt was to call provider if he did not get better or if things got worse.  Stated that pt has uncontrollable diarrhea, shaking all over, sweating and balance is off.  Wife gave pt tylenol about 30 minutes ago and pt is still shaking she says.  Please advise.

## 2022-11-21 LAB — COLOGUARD: COLOGUARD: NEGATIVE

## 2022-11-28 ENCOUNTER — Other Ambulatory Visit: Payer: Medicaid Other

## 2022-11-28 DIAGNOSIS — E1069 Type 1 diabetes mellitus with other specified complication: Secondary | ICD-10-CM

## 2022-11-28 DIAGNOSIS — E1159 Type 2 diabetes mellitus with other circulatory complications: Secondary | ICD-10-CM

## 2022-11-29 LAB — CBC WITH DIFFERENTIAL/PLATELET
Basophils Absolute: 0 10*3/uL (ref 0.0–0.2)
Basos: 1 %
EOS (ABSOLUTE): 0.1 10*3/uL (ref 0.0–0.4)
Eos: 3 %
Hematocrit: 39.5 % (ref 37.5–51.0)
Hemoglobin: 12.7 g/dL — ABNORMAL LOW (ref 13.0–17.7)
Immature Grans (Abs): 0 10*3/uL (ref 0.0–0.1)
Immature Granulocytes: 1 %
Lymphocytes Absolute: 0.7 10*3/uL (ref 0.7–3.1)
Lymphs: 17 %
MCH: 31.3 pg (ref 26.6–33.0)
MCHC: 32.2 g/dL (ref 31.5–35.7)
MCV: 97 fL (ref 79–97)
Monocytes Absolute: 0.6 10*3/uL (ref 0.1–0.9)
Monocytes: 13 %
Neutrophils Absolute: 2.8 10*3/uL (ref 1.4–7.0)
Neutrophils: 65 %
Platelets: 250 10*3/uL (ref 150–450)
RBC: 4.06 x10E6/uL — ABNORMAL LOW (ref 4.14–5.80)
RDW: 11.9 % (ref 11.6–15.4)
WBC: 4.2 10*3/uL (ref 3.4–10.8)

## 2022-11-29 LAB — LIPID PANEL
Chol/HDL Ratio: 3.8 ratio (ref 0.0–5.0)
Cholesterol, Total: 149 mg/dL (ref 100–199)
HDL: 39 mg/dL — ABNORMAL LOW (ref >39)
LDL Chol Calc (NIH): 88 mg/dL (ref 0–99)
Triglycerides: 124 mg/dL (ref 0–149)
VLDL Cholesterol Cal: 22 mg/dL (ref 5–40)

## 2022-11-29 LAB — COMPREHENSIVE METABOLIC PANEL
ALT: 25 [IU]/L (ref 0–44)
AST: 23 [IU]/L (ref 0–40)
Albumin: 3.8 g/dL — ABNORMAL LOW (ref 4.1–5.1)
Alkaline Phosphatase: 83 [IU]/L (ref 44–121)
BUN/Creatinine Ratio: 9 (ref 9–20)
BUN: 8 mg/dL (ref 6–24)
Bilirubin Total: 0.5 mg/dL (ref 0.0–1.2)
CO2: 22 mmol/L (ref 20–29)
Calcium: 9 mg/dL (ref 8.7–10.2)
Chloride: 103 mmol/L (ref 96–106)
Creatinine, Ser: 0.86 mg/dL (ref 0.76–1.27)
Globulin, Total: 2.1 g/dL (ref 1.5–4.5)
Glucose: 147 mg/dL — ABNORMAL HIGH (ref 70–99)
Potassium: 5.3 mmol/L — ABNORMAL HIGH (ref 3.5–5.2)
Sodium: 138 mmol/L (ref 134–144)
Total Protein: 5.9 g/dL — ABNORMAL LOW (ref 6.0–8.5)
eGFR: 109 mL/min/{1.73_m2} (ref >59)

## 2022-12-05 ENCOUNTER — Ambulatory Visit (INDEPENDENT_AMBULATORY_CARE_PROVIDER_SITE_OTHER): Payer: Medicaid Other | Admitting: Family Medicine

## 2022-12-05 ENCOUNTER — Encounter: Payer: Self-pay | Admitting: Family Medicine

## 2022-12-05 VITALS — BP 119/75 | HR 103 | Resp 18 | Ht 72.0 in | Wt 325.0 lb

## 2022-12-05 DIAGNOSIS — E875 Hyperkalemia: Secondary | ICD-10-CM | POA: Diagnosis not present

## 2022-12-05 DIAGNOSIS — D649 Anemia, unspecified: Secondary | ICD-10-CM

## 2022-12-05 DIAGNOSIS — E1069 Type 1 diabetes mellitus with other specified complication: Secondary | ICD-10-CM

## 2022-12-05 DIAGNOSIS — E1159 Type 2 diabetes mellitus with other circulatory complications: Secondary | ICD-10-CM

## 2022-12-05 DIAGNOSIS — I152 Hypertension secondary to endocrine disorders: Secondary | ICD-10-CM

## 2022-12-05 DIAGNOSIS — E782 Mixed hyperlipidemia: Secondary | ICD-10-CM

## 2022-12-05 DIAGNOSIS — E1065 Type 1 diabetes mellitus with hyperglycemia: Secondary | ICD-10-CM

## 2022-12-05 DIAGNOSIS — Z0000 Encounter for general adult medical examination without abnormal findings: Secondary | ICD-10-CM | POA: Diagnosis not present

## 2022-12-05 NOTE — Assessment & Plan Note (Signed)
Last lipid panel: LDL 88, HDL 39, triglycerides 124.  Continue Zetia 10 mg daily, continue Xarelto 10 mg daily.  Lipid panel within normal limits.  Will continue to monitor.

## 2022-12-05 NOTE — Patient Instructions (Addendum)
Refills of medications were sent to [place] [facility] on 10/04/2022, let me know if they did not receive those.  We will recheck your potassium and hemoglobin at your lab appointment scheduled for December, but we can cancel the in person appointment to save you a trip.

## 2022-12-05 NOTE — Progress Notes (Signed)
Complete physical exam  Patient: Michael Hutchinson   DOB: 1975/01/11   47 y.o. Male  MRN: [id number]  Subjective:    Chief Complaint  Patient presents with   Annual Exam    Michael Hutchinson is a 47 y.o. male who presents today for a complete physical exam. He reports consuming a general diet. He generally feels fairly well.  He does note that he was very sick with gastroenteritis shortly before his most recent labs were drawn on 11/28/2022.  He reports sleeping fairly well. He does not have additional problems to discuss today.    Most recent fall risk assessment:    04/04/2022    3:01 PM  Fall Risk   Falls in the past year? 1  Number falls in past yr: 0  Injury with Fall? 1  Risk for fall due to : Impaired mobility     Most recent depression and anxiety screenings:    12/05/2022    3:12 PM 11/03/2022    1:39 PM  PHQ 2/9 Scores  PHQ - 2 Score 0 0  PHQ- 9 Score 1 2      08/27/2021    8:34 AM 04/23/2021   11:04 AM 03/15/2021    1:59 PM 02/08/2021    2:30 PM  GAD 7 : Generalized Anxiety Score  Nervous, Anxious, on Edge 0 0 0 0  Control/stop worrying 0 0 0 0  Worry too much - different things 0 0 0 0  Trouble relaxing 0 0 0 0  Restless 0 0 0 0  Easily annoyed or irritable 0 0 0 0  Afraid - awful might happen 0 0 0 0  Total GAD 7 Score 0 0 0 0  Anxiety Difficulty Not difficult at all   Not difficult at all    Patient Active Problem List   Diagnosis Date Noted   Acute cough 04/28/2022   History of Roux-en-Y gastric bypass 12/14/2020   Multiple thyroid nodules 05/20/2020   Carpal tunnel syndrome of right wrist 09/09/2019   Pronator syndrome 09/09/2019   Cervical spine pain 08/07/2019   Amputated below knee (HCC) 06/28/2019   Sacral back pain 12/26/2018   Lumbar spondylosis 12/26/2018   Chronic pain syndrome 11/07/2018   Adhesive capsulitis of right shoulder 07/27/2018   Tendinosis of right shoulder 04/09/2018   Statin intolerance 08/08/2017   Multiple sclerosis  (HCC) 07/11/2017   Lateral epicondylitis of right elbow 03/20/2017   Chronic fatigue 07/04/2016   Diabetic ulcer of left heel associated with type 1 diabetes mellitus, with necrosis of bone (HCC) 12/31/2015   Ankle wound 12/31/2015   Chronic osteomyelitis of left ankle with draining sinus (HCC)    Type 1 diabetes mellitus with hyperglycemia (HCC) 10/12/2015   Low HDL (under 40) 09/22/2015   Vitamin D deficiency 09/22/2015   Right foot injury, sequela 09/26/2014   Charcot's joint of foot 11/25/2013   Hypertension associated with diabetes (HCC) 05/03/2013   Body mass index (BMI) of 40.0-44.9 in adult (HCC) 05/03/2013   Morbid (severe) obesity due to excess calories (HCC) 05/03/2013   Mixed diabetic hyperlipidemia associated with type 1 diabetes mellitus (HCC) 07/26/2012   GERD (gastroesophageal reflux disease)    Tear of meniscus of knee     Past Surgical History:  Procedure Laterality Date   ABDOMINAL SURGERY     AMPUTATION Left 01/01/2016   Procedure: AMPUTATION BELOW KNEE;  Surgeon: Toni Arthurs, MD;  Location: [facility] OR;  Service: Orthopedics;  Laterality: Left;  APPLICATION OF WOUND VAC  04/26/2016   CARPAL TUNNEL WITH CUBITAL TUNNEL Right 10/22/2019   Procedure: Right carpal tunnel release, right cubital tunnel release in situ, Right pronator/proximal median nerve release, Right elbow ECRB debridement with partial ostectomy and repair as necessary;  Surgeon: Dominica Severin, MD;  Location: [facility] OR;  Service: Orthopedics;  Laterality: Right;    GASTRIC ROUX-EN-Y N/A 12/14/2020   Procedure: LAPAROSCOPIC ROUX-EN-Y GASTRIC BYPASS WITH UPPER ENDOSCOPY;  Surgeon: Luretha Murphy, MD;  Location: [facility];  Service: General;  Laterality: N/A;   HERNIA REPAIR     I & D EXTREMITY Left 04/26/2016   Procedure: IRRIGATION AND DEBRIDEMENT EXTREMITY/Left Leg/Possible Wound Vac;  Surgeon: Toni Arthurs, MD;  Location: [facility] OR;  Service: Orthopedics;  Laterality: Left;   INCISE AND DRAIN ABCESS Left  04/26/2016   KNEE ARTHROSCOPY Left ~ 2010   LAPAROSCOPIC CHOLECYSTECTOMY  2015   METACARPOPHALANGEAL JOINT ARTHRODESIS Left 06/2012   Fracture left index finger intra-articular MCP joint/notes 06/30/2012   OPEN REDUCTION INTERNAL FIXATION (ORIF) PROXIMAL PHALANX Left 06/30/2012   Procedure: OPEN REDUCTION INTERNAL FIXATION (ORIF) LEFT INDEX FINGER PROXIMAL PHALANX FRACTURE WITH LIGAMENT REPAIR AS NECESSARY;  Surgeon: Dominica Severin, MD;  Location: [facility];  Service: Orthopedics;  Laterality: Left;   UMBILICAL HERNIA REPAIR  2015   "w/gallbladder OR"   VENTRAL HERNIA REPAIR N/A 11/29/2021   Procedure: LAPAROSCOPIC VENTRAL HERNIA REPAIR WITH MESH;  Surgeon: Luretha Murphy, MD;  Location: [facility];  Service: General;  Laterality: N/A;   Social History   Tobacco Use   Smoking status: Never    Passive exposure: Never   Smokeless tobacco: Former    Types: Snuff, Chew    Quit date: 08/25/2010  Vaping Use   Vaping status: Never Used  Substance Use Topics   Alcohol use: Yes    Alcohol/week: 12.0 standard drinks of alcohol    Types: 12 Cans of beer per week    Comment: rare   Drug use: No   Family History  Problem Relation Age of Onset   Other Mother    Cancer Mother        Breast / Bone   Heart attack Father    Hypertension Father    Hyperlipidemia Father    Diabetes Other    Alcohol abuse Sister    Diabetes Maternal Grandfather    Stroke Paternal Grandmother    Alcohol abuse Paternal Grandfather    Allergies  Allergen Reactions   Influenza Vac Split Quad Nausea And Vomiting   Influenza Virus Vaccine Split Nausea And Vomiting   Statins Other (See Comments)    Myalgias   Hydrochlorothiazide Other (See Comments)    Dizziness   Lisinopril Cough     Patient Care Team: Melida Quitter, PA as PCP - General (Family Medicine) Michael Boston, [place] as Attending Physician (Orthopedic Surgery) Toni Arthurs, MD as Consulting Physician (Orthopedic Surgery) Ginnie Smart, MD as  Consulting Physician (Infectious Diseases) Drema Dallas, DO as Consulting Physician (Neurology) Romero Belling, MD (Inactive) as Consulting Physician (Endocrinology) Carlus Pavlov, MD as Consulting Physician (Internal Medicine)   Outpatient Medications Prior to Visit  Medication Sig   amLODipine (NORVASC) 5 MG tablet Take 1 tablet (5 mg total) by mouth daily.   Blood Pressure Monitoring (BLOOD PRESSURE MONITOR/L CUFF) MISC Use blood pressure cuff and monitor to check blood pressure at home at least several times per week if not daily.   Continuous Blood Gluc Receiver (DEXCOM G6 RECEIVER) DEVI See admin instructions.  Continuous Blood Gluc Sensor (DEXCOM G6 SENSOR) MISC SMARTSIG:1 Topical Every 10 Days   Continuous Blood Gluc Transmit (DEXCOM G6 TRANSMITTER) MISC DISPENSE AND USE AS DIRECTED   ezetimibe (ZETIA) 10 MG tablet Take 1 tablet (10 mg total) by mouth daily.   glucose blood (BAYER CONTOUR NEXT TEST) test strip 1 each by Other route 4 (four) times daily. And lancets 4/day   HYDROcodone-acetaminophen (NORCO/VICODIN) 5-325 MG tablet Take 1 tablet by mouth every 4 (four) hours as needed.   Insulin Disposable Pump (OMNIPOD 5 G6 PODS, GEN 5,) MISC Inject 1 Device into the skin daily.   insulin lispro (HUMALOG) 100 UNIT/ML injection Inject 60-70 Units into the skin daily. Use via insulin pump   Insulin Pen Needle (PEN NEEDLES) 32G X 4 MM MISC USE 5 TIMES DAILY AS DIRECTED   Insulin Syringes, Disposable, U-100 1 ML MISC 3x daily   losartan (COZAAR) 50 MG tablet TAKE 1 TABLET BY MOUTH ONCE DAILY   metoprolol succinate (TOPROL-XL) 100 MG 24 hr tablet Take 1 tablet (100 mg total) by mouth daily. TAKE WITH OR IMMEDIATELY FOLLOWING A MEAL.   Multiple Vitamins-Minerals (MULTIVITAMIN WITH MINERALS) tablet Take 1 tablet by mouth daily.   omeprazole (PRILOSEC OTC) 20 MG tablet Take 20 mg by mouth every other day.   rizatriptan (MAXALT-MLT) 10 MG disintegrating tablet Take 1 tablet earliest onset  of headache.  May repeat after 2 hours if needed. Maximum 2 tablets in 24 hours   No facility-administered medications prior to visit.    Review of Systems  Constitutional:  Negative for chills, fever and malaise/fatigue.  HENT:  Negative for congestion and hearing loss.   Eyes:  Negative for blurred vision and double vision.  Respiratory:  Negative for cough and shortness of breath.   Cardiovascular:  Negative for chest pain, palpitations and leg swelling.  Gastrointestinal:  Positive for abdominal pain and diarrhea. Negative for constipation and heartburn.  Genitourinary:  Negative for frequency and urgency.  Musculoskeletal:  Negative for myalgias and neck pain.  Neurological:  Negative for headaches.  Endo/Heme/Allergies:  Negative for polydipsia.  Psychiatric/Behavioral:  Negative for depression. The patient is not nervous/anxious.       Objective:    BP 119/75 (BP Location: Left Arm, Patient Position: Sitting, Cuff Size: Large)   Pulse (!) 103   Resp 18   Ht 6' (1.829 m)   Wt (!) 325 lb (147.4 kg)   SpO2 97%   BMI 44.08 kg/m    Physical Exam Constitutional:      General: He is not in acute distress.    Appearance: Normal appearance.  HENT:     Head: Normocephalic and atraumatic.     Right Ear: Tympanic membrane, ear canal and external ear normal. There is no impacted cerumen.     Left Ear: Tympanic membrane, ear canal and external ear normal. There is no impacted cerumen.     Nose: Nose normal.     Mouth/Throat:     Mouth: Mucous membranes are moist.     Pharynx: Oropharynx is clear. No posterior oropharyngeal erythema.  Eyes:     Extraocular Movements: Extraocular movements intact.     Conjunctiva/sclera: Conjunctivae normal.     Pupils: Pupils are equal, round, and reactive to light.  Neck:     Thyroid: No thyroid mass, thyromegaly or thyroid tenderness.  Cardiovascular:     Rate and Rhythm: Normal rate and regular rhythm.     Heart sounds: Normal heart  sounds. No murmur  heard.    No friction rub. No gallop.  Pulmonary:     Effort: Pulmonary effort is normal. No respiratory distress.     Breath sounds: Normal breath sounds. No stridor. No wheezing or rales.  Abdominal:     General: Bowel sounds are normal.     Palpations: Abdomen is soft. There is no mass.     Tenderness: There is abdominal tenderness in the right upper quadrant and left upper quadrant.  Musculoskeletal:        General: Normal range of motion.     Cervical back: Normal range of motion and neck supple.     Comments: Prosthetic left leg in place  Lymphadenopathy:     Cervical: No cervical adenopathy.  Skin:    General: Skin is warm and dry.  Neurological:     Mental Status: He is alert and oriented to person, place, and time.     Cranial Nerves: No cranial nerve deficit.     Motor: No weakness.     Deep Tendon Reflexes: Reflexes normal.  Psychiatric:        Mood and Affect: Mood normal.        Assessment & Plan:    Routine Health Maintenance and Physical Exam  Immunization History  Administered Date(s) Administered   Influenza Nasal 11/21/2012   Influenza Split 11/21/2011   Pneumococcal Polysaccharide-23 01/10/2010   Tdap 01/11/2005, 01/16/2005, 01/12/2015    Health Maintenance  Topic Date Due   OPHTHALMOLOGY EXAM  Never done   COVID-19 Vaccine (1 - 2023-24 season) 12/21/2022 (Originally 09/11/2022)   INFLUENZA VACCINE  04/10/2023 (Originally 08/11/2022)   Diabetic kidney evaluation - Urine ACR  03/17/2023   HEMOGLOBIN A1C  04/03/2023   FOOT EXAM  04/04/2023   Diabetic kidney evaluation - eGFR measurement  11/28/2023   DTaP/Tdap/Td (4 - Td or Tdap) 01/11/2025   Fecal DNA (Cologuard)  11/11/2025   Hepatitis C Screening  Completed   HIV Screening  Completed   HPV VACCINES  Aged Out    Reviewed most recent labs including CBC, CMP, lipid panel, A1C, TSH, and vitamin D. All within normal limits/stable from last check other than HDL borderline at 39,  potassium slightly high 5.3, hemoglobin low 12.7.  Recheck BMP and CBC at upcoming lab appointment in December.  Discussed health benefits of physical activity, and encouraged him to engage in regular exercise appropriate for his age and condition.  Wellness examination  Hypertension associated with diabetes (HCC) Assessment & Plan: BP goal <130/80.  Stable, at goal.  Continue amlodipine 5 mg daily, losartan 50 mg daily, metoprolol succinate 100 mg daily.  Continue ambulatory blood pressure monitoring.  Kidney and liver function have normalized but CMP did show mild hyperkalemia.  Recheck in 1 month.   Mixed diabetic hyperlipidemia associated with type 1 diabetes mellitus (HCC) Assessment & Plan: Last lipid panel: LDL 88, HDL 39, triglycerides 124.  Continue Zetia 10 mg daily, continue Xarelto 10 mg daily.  Lipid panel within normal limits.  Will continue to monitor.   Type 1 diabetes mellitus with hyperglycemia [facility]) Assessment & Plan: Established with endocrinology through [facility], last appointment 11/16/2022 with follow-up scheduled for 02/16/2023.  Due for eye exam, patient is aware and plans to schedule soon.  Kidney function within normal limits.  Will coordinate with endocrinology regarding frequency of blood work.     Return in about 6 months (around 06/04/2023) for follow-up for HTN, HLD, fasting blood work 1 week before.    Lequita Halt  Garrel Ridgel, PA

## 2022-12-05 NOTE — Assessment & Plan Note (Signed)
BP goal <130/80.  Stable, at goal.  Continue amlodipine 5 mg daily, losartan 50 mg daily, metoprolol succinate 100 mg daily.  Continue ambulatory blood pressure monitoring.  Kidney and liver function have normalized but CMP did show mild hyperkalemia.  Recheck in 1 month.

## 2022-12-05 NOTE — Assessment & Plan Note (Signed)
Established with endocrinology through [facility] clinic, last appointment 11/16/2022 with follow-up scheduled for 02/16/2023.  Due for eye exam, patient is aware and plans to schedule soon.  Kidney function within normal limits.  Will coordinate with endocrinology regarding frequency of blood work.

## 2022-12-11 ENCOUNTER — Telehealth: Payer: Medicaid Other | Admitting: Physician Assistant

## 2022-12-11 DIAGNOSIS — J069 Acute upper respiratory infection, unspecified: Secondary | ICD-10-CM | POA: Diagnosis not present

## 2022-12-11 MED ORDER — FLUTICASONE PROPIONATE 50 MCG/ACT NA SUSP
2.0000 | Freq: Every day | NASAL | 6 refills | Status: DC
Start: 2022-12-11 — End: 2023-01-06

## 2022-12-11 MED ORDER — CETIRIZINE HCL 10 MG PO TABS: 10.0000 mg | ORAL_TABLET | Freq: Every day | ORAL | 0 refills | Status: DC

## 2022-12-11 NOTE — Progress Notes (Signed)
Virtual Visit Consent   Michael Hutchinson, you are scheduled for a virtual visit with a [facility] provider today. Just as with appointments in the office, your consent must be obtained to participate. Your consent will be active for this visit and any virtual visit you may have with one of our providers in the next 365 days. If you have a MyChart account, a copy of this consent can be sent to you electronically.  As this is a virtual visit, video technology does not allow for your provider to perform a traditional examination. This may limit your provider's ability to fully assess your condition. If your provider identifies any concerns that need to be evaluated in person or the need to arrange testing (such as labs, EKG, etc.), we will make arrangements to do so. Although advances in technology are sophisticated, we cannot ensure that it will always work on either your end or our end. If the connection with a video visit is poor, the visit may have to be switched to a telephone visit. With either a video or telephone visit, we are not always able to ensure that we have a secure connection.  By engaging in this virtual visit, you consent to the provision of healthcare and authorize for your insurance to be billed (if applicable) for the services provided during this visit. Depending on your insurance coverage, you may receive a charge related to this service.  I need to obtain your verbal consent now. Are you willing to proceed with your visit today? Michael Hutchinson has provided verbal consent on 12/11/2022 for a virtual visit (video or telephone). Laure Kidney, [place]  Date: 12/11/2022 3:08 PM  Virtual Visit via Video Note   I, Laure Kidney, connected with  Michael Hutchinson  ([id number], December 22, 1975) on 12/11/22 at  3:00 PM EST by a video-enabled telemedicine application and verified that I am speaking with the correct person using two identifiers.  Location: Patient: Virtual Visit Location Patient:  Home Provider: Virtual Visit Location Provider: Home Office   I discussed the limitations of evaluation and management by telemedicine and the availability of in person appointments. The patient expressed understanding and agreed to proceed.    History of Present Illness: Michael Hutchinson is a 47 y.o. who identifies as a male who was assigned male at birth, and is being seen today for sinus congestion.  HPI: URI  This is a new problem. Episode onset: 2 days prior. The problem has been unchanged. There has been no fever. Associated symptoms include congestion and sinus pain. Treatments tried: OTC meds.    Problems:  Patient Active Problem List   Diagnosis Date Noted   Acute cough 04/28/2022   History of Roux-en-Y gastric bypass 12/14/2020   Multiple thyroid nodules 05/20/2020   Carpal tunnel syndrome of right wrist 09/09/2019   Pronator syndrome 09/09/2019   Cervical spine pain 08/07/2019   Amputated below knee (HCC) 06/28/2019   Sacral back pain 12/26/2018   Lumbar spondylosis 12/26/2018   Chronic pain syndrome 11/07/2018   Adhesive capsulitis of right shoulder 07/27/2018   Tendinosis of right shoulder 04/09/2018   Statin intolerance 08/08/2017   Multiple sclerosis (HCC) 07/11/2017   Lateral epicondylitis of right elbow 03/20/2017   Chronic fatigue 07/04/2016   Diabetic ulcer of left heel associated with type 1 diabetes mellitus, with necrosis of bone (HCC) 12/31/2015   Ankle wound 12/31/2015   Chronic osteomyelitis of left ankle with draining sinus (HCC)    Type 1 diabetes  mellitus with hyperglycemia (HCC) 10/12/2015   Low HDL (under 40) 09/22/2015   Vitamin D deficiency 09/22/2015   Right foot injury, sequela 09/26/2014   Charcot's joint of foot 11/25/2013   Hypertension associated with diabetes (HCC) 05/03/2013   Body mass index (BMI) of 40.0-44.9 in adult [facility]) 05/03/2013   Morbid (severe) obesity due to excess calories (HCC) 05/03/2013   Mixed diabetic hyperlipidemia  associated with type 1 diabetes mellitus (HCC) 07/26/2012   GERD (gastroesophageal reflux disease)    Tear of meniscus of knee     Allergies:  Allergies  Allergen Reactions   Influenza Vac Split Quad Nausea And Vomiting   Influenza Virus Vaccine Split Nausea And Vomiting   Statins Other (See Comments)    Myalgias   Hydrochlorothiazide Other (See Comments)    Dizziness   Lisinopril Cough   Medications:  Current Outpatient Medications:    cetirizine (ZYRTEC ALLERGY) 10 MG tablet, Take 1 tablet (10 mg total) by mouth daily., Disp: 30 tablet, Rfl: 0   fluticasone (FLONASE) 50 MCG/ACT nasal spray, Place 2 sprays into both nostrils daily., Disp: 16 g, Rfl: 6   amLODipine (NORVASC) 5 MG tablet, Take 1 tablet (5 mg total) by mouth daily., Disp: 90 tablet, Rfl: 3   Blood Pressure Monitoring (BLOOD PRESSURE MONITOR/L CUFF) MISC, Use blood pressure cuff and monitor to check blood pressure at home at least several times per week if not daily., Disp: 1 each, Rfl: 0   Continuous Blood Gluc Receiver (DEXCOM G6 RECEIVER) DEVI, See admin instructions., Disp: , Rfl:    Continuous Blood Gluc Sensor (DEXCOM G6 SENSOR) MISC, SMARTSIG:1 Topical Every 10 Days, Disp: , Rfl:    Continuous Blood Gluc Transmit (DEXCOM G6 TRANSMITTER) MISC, DISPENSE AND USE AS DIRECTED, Disp: , Rfl:    ezetimibe (ZETIA) 10 MG tablet, Take 1 tablet (10 mg total) by mouth daily., Disp: 90 tablet, Rfl: 3   glucose blood (BAYER CONTOUR NEXT TEST) test strip, 1 each by Other route 4 (four) times daily. And lancets 4/day, Disp: 400 each, Rfl: 3   HYDROcodone-acetaminophen (NORCO/VICODIN) 5-325 MG tablet, Take 1 tablet by mouth every 4 (four) hours as needed., Disp: 30 tablet, Rfl: 0   Insulin Disposable Pump (OMNIPOD 5 G6 PODS, GEN 5,) MISC, Inject 1 Device into the skin daily., Disp: , Rfl:    insulin lispro (HUMALOG) 100 UNIT/ML injection, Inject 60-70 Units into the skin daily. Use via insulin pump, Disp: , Rfl:    Insulin Pen Needle  (PEN NEEDLES) 32G X 4 MM MISC, USE 5 TIMES DAILY AS DIRECTED, Disp: , Rfl:    Insulin Syringes, Disposable, U-100 1 ML MISC, 3x daily, Disp: 50 each, Rfl: 2   losartan (COZAAR) 50 MG tablet, TAKE 1 TABLET BY MOUTH ONCE DAILY, Disp: 90 tablet, Rfl: 1   metoprolol succinate (TOPROL-XL) 100 MG 24 hr tablet, Take 1 tablet (100 mg total) by mouth daily. TAKE WITH OR IMMEDIATELY FOLLOWING A MEAL., Disp: 90 tablet, Rfl: 1   Multiple Vitamins-Minerals (MULTIVITAMIN WITH MINERALS) tablet, Take 1 tablet by mouth daily., Disp: , Rfl:    omeprazole (PRILOSEC OTC) 20 MG tablet, Take 20 mg by mouth every other day., Disp: , Rfl:    rizatriptan (MAXALT-MLT) 10 MG disintegrating tablet, Take 1 tablet earliest onset of headache.  May repeat after 2 hours if needed. Maximum 2 tablets in 24 hours, Disp: 10 tablet, Rfl: 5  Observations/Objective: Patient is well-developed, well-nourished in no acute distress.  Resting comfortably  at home.  Head is normocephalic, atraumatic.  No labored breathing.  Speech is clear and coherent with logical content.  Patient is alert and oriented at baseline.    Assessment and Plan: 1. Viral URI  Patient presents with symptoms suspicious for likely viral upper respiratory infection. Differential includes bacterial pneumonia, sinusitis, allergic rhinitis. Do not suspect underlying cardiopulmonary process. I considered, but think unlikely, dangerous causes of this patient's symptoms to include ACS, CHF or COPD exacerbations, pneumonia, pneumothorax. Patient is nontoxic appearing and not in need of emergent medical intervention.  Plan: reassurance, reassessment, over the counter medications, discharge with PCP followup   Follow Up Instructions: I discussed the assessment and treatment plan with the patient. The patient was provided an opportunity to ask questions and all were answered. The patient agreed with the plan and demonstrated an understanding of the instructions.  A copy  of instructions were sent to the patient via MyChart unless otherwise noted below.     The patient was advised to call back or seek an in-person evaluation if the symptoms worsen or if the condition fails to improve as anticipated.    Laure Kidney, PA-C

## 2022-12-12 ENCOUNTER — Other Ambulatory Visit: Payer: Self-pay

## 2022-12-12 ENCOUNTER — Ambulatory Visit: Payer: Self-pay | Admitting: Family Medicine

## 2022-12-12 ENCOUNTER — Emergency Department: Payer: Medicaid Other

## 2022-12-12 ENCOUNTER — Emergency Department
Admission: EM | Admit: 2022-12-12 | Discharge: 2022-12-12 | Disposition: A | Discharge status: Home / Self Care | Payer: Medicaid Other | Attending: Emergency Medicine | Admitting: Emergency Medicine

## 2022-12-12 DIAGNOSIS — E109 Type 1 diabetes mellitus without complications: Secondary | ICD-10-CM | POA: Insufficient documentation

## 2022-12-12 DIAGNOSIS — J9801 Acute bronchospasm: Secondary | ICD-10-CM | POA: Diagnosis not present

## 2022-12-12 DIAGNOSIS — R0602 Shortness of breath: Secondary | ICD-10-CM | POA: Diagnosis present

## 2022-12-12 LAB — CBC
HCT: 40.2 % (ref 39.0–52.0)
Hemoglobin: 13.8 g/dL (ref 13.0–17.0)
MCH: 32.3 pg (ref 26.0–34.0)
MCHC: 34.3 g/dL (ref 30.0–36.0)
MCV: 94.1 fL (ref 80.0–100.0)
Platelets: 234 10*3/uL (ref 150–400)
RBC: 4.27 MIL/uL (ref 4.22–5.81)
RDW: 12.8 % (ref 11.5–15.5)
WBC: 6.1 10*3/uL (ref 4.0–10.5)
nRBC: 0 % (ref 0.0–0.2)

## 2022-12-12 LAB — BASIC METABOLIC PANEL
Anion gap: 14 (ref 5–15)
BUN: 12 mg/dL (ref 6–20)
CO2: 24 mmol/L (ref 22–32)
Calcium: 8.8 mg/dL — ABNORMAL LOW (ref 8.9–10.3)
Chloride: 98 mmol/L (ref 98–111)
Creatinine, Ser: 1.09 mg/dL (ref 0.61–1.24)
GFR, Estimated: >60 mL/min (ref >60)
Glucose, Bld: 138 mg/dL — ABNORMAL HIGH (ref 70–99)
Potassium: 4 mmol/L (ref 3.5–5.1)
Sodium: 136 mmol/L (ref 135–145)

## 2022-12-12 MED ORDER — PREDNISONE 20 MG PO TABS: 40.0000 mg | ORAL_TABLET | Freq: Every day | ORAL | 0 refills | Status: AC

## 2022-12-12 MED ORDER — IPRATROPIUM-ALBUTEROL 0.5-2.5 (3) MG/3ML IN SOLN
3.0000 mL | Freq: Once | RESPIRATORY_TRACT | Status: AC
  Administered 2022-12-12: 3 mL via RESPIRATORY_TRACT
  Filled 2022-12-12: qty 3

## 2022-12-12 MED ORDER — ALBUTEROL SULFATE HFA 108 (90 BASE) MCG/ACT IN AERS: 2.0000 | INHALATION_SPRAY | Freq: Four times a day (QID) | RESPIRATORY_TRACT | 2 refills | Status: DC | PRN

## 2022-12-12 NOTE — ED Triage Notes (Signed)
Pt comes with c/o sob. Pt states this started last night and on to this morning. Pt states some chest tightness. Pt states he fells he can't get a deep breath.   Pt states cough and cold symptoms that started week ago.

## 2022-12-12 NOTE — Telephone Encounter (Signed)
Copied from CRM [id number]. Topic: Clinical - Red Word Triage >> Dec 12, 2022  8:18 AM Almira Coaster wrote: Red Word that prompted transfer to Nurse Triage: Patient's wife called to advise that patient is experiencing difficulty breathing, when he is finally able to catch his breath he smells a weird smell and mild coughing.   Chief Complaint: shortness of breath Symptoms: cough and burning sensation in nose with deep breath Frequency: constant since Wednesday 12/06/22 Pertinent Negatives: Patient denies fever, having to speak in short phrases  Disposition: [] ED /[x] Urgent Care (no appt availability in office) / [] Appointment(In office/virtual)/ []  [facility]/ [] Home Care/ [] Refused Recommended Disposition /[] [facility]/ []  Follow-up with PCP Additional Notes: The patient's wife called and reported that the patient was experiencing shortness of breath and coughing since Wednesday 12/06/22.  The patient had a sinus injection that she believes is "still lingering."  He had a virtual visit yesterday that she felt was unhelpful.  The patient reported that he feels like he can't get a good breath and it feels like gasoline in his nose when he tries to take a deep breath.  The patient's heart rate was 101 and pulse ox was 92%.  No same day appointments were available. Referred the patient to urgent care for further evaluation.  Reason for Disposition  [1] MILD difficulty breathing (e.g., minimal/no SOB at rest, SOB with walking, pulse <100) AND [2] NEW-onset or WORSE than normal  Answer Assessment - Initial Assessment Questions 1. RESPIRATORY STATUS: "Describe your breathing?" (e.g., wheezing, shortness of breath, unable to speak, severe coughing)      Every time he tries to take a deep breath he starts coughing 2. ONSET: "When did this breathing problem begin?"      Wednesday 3. PATTERN "Does the difficult breathing come and go, or has it been constant since it started?"       Constant 4. SEVERITY: "How bad is your breathing?" (e.g., mild, moderate, severe)    - MILD: No SOB at rest, mild SOB with walking, speaks normally in sentences, can lie down, no retractions, pulse < 100.    - MODERATE: SOB at rest, SOB with minimal exertion and prefers to sit, cannot lie down flat, speaks in phrases, mild retractions, audible wheezing, pulse 100-120.    - SEVERE: Very SOB at rest, speaks in single words, struggling to breathe, sitting hunched forward, retractions, pulse > 120      Moderate - feels like he's not getting a full breath 5. RECURRENT SYMPTOM: "Have you had difficulty breathing before?" If Yes, ask: "When was the last time?" and "What happened that time?"      Everytime he gets sick with a sinus infection or cold 6. CARDIAC HISTORY: "Do you have any history of heart disease?" (e.g., heart attack, angina, bypass surgery, angioplasty)      No 7. LUNG HISTORY: "Do you have any history of lung disease?"  (e.g., pulmonary embolus, asthma, emphysema)    Exercise induced Asthma 8. CAUSE: "What do you think is causing the breathing problem?"      Previous sinus infection that the patient hasn't gotten over  9. OTHER SYMPTOMS: "Do you have any other symptoms? (e.g., dizziness, runny nose, cough, chest pain, fever)     Around his eyes were swollen yesterday and seems to be improving but still slightly  10. O2 SATURATION MONITOR:  "Do you use an oxygen saturation monitor (pulse oximeter) at home?" If Yes, ask: "What is your reading (  oxygen level) today?" "What is your usual oxygen saturation reading?" (e.g., 95%)       101 bpm 92%  Protocols used: Breathing Difficulty-A-AH

## 2022-12-12 NOTE — ED Provider Notes (Signed)
[facility] Provider Note    Event Date/Time   First MD Initiated Contact with Patient 12/12/22 [phone]     (approximate)   History   Shortness of Breath   HPI  Michael Hutchinson is a 47 y.o. male with history of type 1 diabetes, GERD who presents with complaints of shortness of breath, chest tightness.  Patient reports he had upper respiratory infection last week, seem to worsen and now feels that he cannot take a deep breath.  He reports when he went to cold air earlier it did help significantly.  Denies fevers or chills.  Intermittent cough.  No lower extremity swelling or edema     Physical Exam   Triage Vital Signs: ED Triage Vitals [12/12/22 0935]  Encounter Vitals Group     BP 135/78     Systolic BP Percentile      Diastolic BP Percentile      Pulse Rate (!) 104     Resp 20     Temp 98.8 F (37.1 C)     Temp src      SpO2 99 %     Weight (!) 147.4 kg (325 lb)     Height 1.829 m (6')     Head Circumference      Peak Flow      Pain Score 7     Pain Loc      Pain Education      Exclude from Growth Chart     Most recent vital signs: Vitals:   12/12/22 0935 12/12/22 1040  BP: 135/78 138/68  Pulse: (!) 104 99  Resp: 20 20  Temp: 98.8 F (37.1 C)   SpO2: 99% 99%     General: Awake, no distress.  CV:  Good peripheral perfusion.  Resp:  Normal effort.  Scattered mild wheezes on exhalation Abd:  No distention.  Other:     ED Results / Procedures / Treatments   Labs (all labs ordered are listed, but only abnormal results are displayed) Labs Reviewed  BASIC METABOLIC PANEL - Abnormal; Notable for the following components:      Result Value   Glucose, Bld 138 (*)    Calcium 8.8 (*)    All other components within normal limits  CBC     EKG  ED ECG REPORT I, Jene Every, the attending physician, personally viewed and interpreted this ECG.  Date: 12/12/2022  Rhythm: Sinus tachycardia QRS Axis: normal Intervals:  normal ST/T Wave abnormalities: normal Narrative Interpretation: no evidence of acute ischemia    RADIOLOGY Chest x-ray viewed interpret by me, no pneumonia    PROCEDURES:  Critical Care performed:   Procedures   MEDICATIONS ORDERED IN ED: Medications  ipratropium-albuterol (DUONEB) 0.5-2.5 (3) MG/3ML nebulizer solution 3 mL (3 mLs Nebulization Given 12/12/22 1053)  ipratropium-albuterol (DUONEB) 0.5-2.5 (3) MG/3ML nebulizer solution 3 mL (3 mLs Nebulization Given 12/12/22 1053)  ipratropium-albuterol (DUONEB) 0.5-2.5 (3) MG/3ML nebulizer solution 3 mL (3 mLs Nebulization Given 12/12/22 1200)     IMPRESSION / MDM / ASSESSMENT AND PLAN / ED COURSE  I reviewed the triage vital signs and the nursing notes. Patient's presentation is most consistent with acute presentation with potential threat to life or bodily function.  Patient presents with shortness of breath as detailed above.  Differential includes pneumonia, bronchospasm secondary to viral URI,  Overall well-appearing and in no acute distress, mild tachycardia noted, no pleurisy, no recent travel, no lower extremity swelling or edema, low risk  for PE  Chest x-ray without evidence of pneumonia, lab work is reassuring, will treat with DuoNebs and reevaluate  Patient feeling significantly better after DuoNebs, lung exam much improved as well.  Discussed with him outpatient treatment with prednisone, he is diabetic, he is comfortable controlling his sugar as he has been doing so for 40 years.  He would like to take the prednisone, will also prescribe albuterol inhaler.  No indication for admission at this time      FINAL CLINICAL IMPRESSION(S) / ED DIAGNOSES   Final diagnoses:  Bronchospasm     Rx / DC Orders   ED Discharge Orders          Ordered    predniSONE (DELTASONE) 20 MG tablet  Daily with breakfast        12/12/22 1152    albuterol (VENTOLIN HFA) 108 (90 Base) MCG/ACT inhaler  Every 6 hours PRN         12/12/22 1152             Note:  This document was prepared using Dragon voice recognition software and may include unintentional dictation errors.   Jene Every, MD 12/12/22 [phone]

## 2022-12-12 NOTE — ED Notes (Signed)
See triage note  Presents with some SOB   States he noticed this last pm  and thinks it is worse this am  Also having some chest discomfort

## 2022-12-12 NOTE — Telephone Encounter (Signed)
Agree with recommendation to refer to urgent care for in person evaluation due to no availability with PCP

## 2022-12-21 ENCOUNTER — Other Ambulatory Visit: Payer: Medicaid Other

## 2022-12-28 ENCOUNTER — Ambulatory Visit: Payer: Medicaid Other | Admitting: Family Medicine

## 2023-01-01 ENCOUNTER — Emergency Department: Payer: Medicaid Other

## 2023-01-01 ENCOUNTER — Other Ambulatory Visit: Payer: Self-pay

## 2023-01-01 ENCOUNTER — Emergency Department
Admission: EM | Admit: 2023-01-01 | Discharge: 2023-01-02 | Disposition: A | Discharge status: Home / Self Care | Payer: Medicaid Other | Source: Home / Self Care | Attending: Emergency Medicine | Admitting: Emergency Medicine

## 2023-01-01 DIAGNOSIS — E860 Dehydration: Secondary | ICD-10-CM | POA: Insufficient documentation

## 2023-01-01 DIAGNOSIS — B349 Viral infection, unspecified: Secondary | ICD-10-CM | POA: Insufficient documentation

## 2023-01-01 DIAGNOSIS — Z20822 Contact with and (suspected) exposure to covid-19: Secondary | ICD-10-CM | POA: Insufficient documentation

## 2023-01-01 DIAGNOSIS — R531 Weakness: Secondary | ICD-10-CM

## 2023-01-01 LAB — COMPREHENSIVE METABOLIC PANEL
ALT: 29 U/L (ref 0–44)
AST: 32 U/L (ref 15–41)
Albumin: 3.6 g/dL (ref 3.5–5.0)
Alkaline Phosphatase: 93 U/L (ref 38–126)
Anion gap: 17 — ABNORMAL HIGH (ref 5–15)
BUN: 20 mg/dL (ref 6–20)
CO2: 20 mmol/L — ABNORMAL LOW (ref 22–32)
Calcium: 8 mg/dL — ABNORMAL LOW (ref 8.9–10.3)
Chloride: 99 mmol/L (ref 98–111)
Creatinine, Ser: 1 mg/dL (ref 0.61–1.24)
GFR, Estimated: >60 mL/min (ref >60)
Glucose, Bld: 164 mg/dL — ABNORMAL HIGH (ref 70–99)
Potassium: 3.7 mmol/L (ref 3.5–5.1)
Sodium: 136 mmol/L (ref 135–145)
Total Bilirubin: 1.2 mg/dL — ABNORMAL HIGH (ref <1.2)
Total Protein: 6.6 g/dL (ref 6.5–8.1)

## 2023-01-01 LAB — CBC
HCT: 44.3 % (ref 39.0–52.0)
Hemoglobin: 15 g/dL (ref 13.0–17.0)
MCH: 31.8 pg (ref 26.0–34.0)
MCHC: 33.9 g/dL (ref 30.0–36.0)
MCV: 93.9 fL (ref 80.0–100.0)
Platelets: 296 10*3/uL (ref 150–400)
RBC: 4.72 MIL/uL (ref 4.22–5.81)
RDW: 13.1 % (ref 11.5–15.5)
WBC: 6.9 10*3/uL (ref 4.0–10.5)
nRBC: 0 % (ref 0.0–0.2)

## 2023-01-01 LAB — RESP PANEL BY RT-PCR (RSV, FLU A&B, COVID)  RVPGX2
Influenza A by PCR: NEGATIVE
Influenza B by PCR: NEGATIVE
Resp Syncytial Virus by PCR: NEGATIVE
SARS Coronavirus 2 by RT PCR: NEGATIVE

## 2023-01-01 MED ORDER — ONDANSETRON 4 MG PO TBDP: 4.0000 mg | ORAL_TABLET | Freq: Three times a day (TID) | ORAL | 0 refills | Status: DC | PRN

## 2023-01-01 MED ORDER — ONDANSETRON HCL 4 MG/2ML IJ SOLN
4.0000 mg | Freq: Once | INTRAMUSCULAR | Status: AC
  Administered 2023-01-01: 4 mg via INTRAVENOUS
  Filled 2023-01-01: qty 2

## 2023-01-01 MED ORDER — LACTATED RINGERS IV BOLUS
1000.0000 mL | Freq: Once | INTRAVENOUS | Status: AC
  Administered 2023-01-01: 1000 mL via INTRAVENOUS

## 2023-01-01 NOTE — ED Triage Notes (Signed)
Pt reports weakness, fatigue, SOB, cough, sneezing, and eye swelling x 3 days. No known exposure to anyone sick. Reports subjective fever at home but afebrile when checking it. Afebrile in triage. Pt alert and oriented. Breathing unlabored speaking in full sentences.

## 2023-01-01 NOTE — ED Provider Notes (Signed)
[facility] Provider Note    Event Date/Time   First MD Initiated Contact with Patient 01/01/23 2117     (approximate)   History   Chief Complaint Weakness   HPI  Michael Hutchinson is a 47 y.o. male with past medical history of hypertension, diabetes, multiple sclerosis, and chronic pain syndrome who presents to the ED complaining of weakness.  Patient reports that he has been feeling generally weak with malaise for the past 3 days.  He describes cough, congestion, mild difficulty breathing, diarrhea, and nausea.  He states that he has had a very poor appetite recently with minimal oral intake, now describes feeling dehydrated.  He is not aware of any fevers or sick contacts.  He has not had any vomiting, dysuria, abdominal pain, or flank pain.  He also denies any pain in his chest.     Physical Exam   Triage Vital Signs: ED Triage Vitals  Encounter Vitals Group     BP 01/01/23 2058 115/79     Systolic BP Percentile --      Diastolic BP Percentile --      Pulse Rate 01/01/23 2058 (!) 101     Resp 01/01/23 2058 20     Temp 01/01/23 2058 98.8 F (37.1 C)     Temp Source 01/01/23 2058 Oral     SpO2 01/01/23 2058 96 %     Weight --      Height --      Head Circumference --      Peak Flow --      Pain Score 01/01/23 2057 2     Pain Loc --      Pain Education --      Exclude from Growth Chart --     Most recent vital signs: Vitals:   01/01/23 2058  BP: 115/79  Pulse: (!) 101  Resp: 20  Temp: 98.8 F (37.1 C)  SpO2: 96%    Constitutional: Alert and oriented. Eyes: Conjunctivae are normal. Head: Atraumatic. Nose: No congestion/rhinnorhea. Mouth/Throat: Mucous membranes are dry.  Cardiovascular: Normal rate, regular rhythm. Grossly normal heart sounds.  2+ radial pulses bilaterally. Respiratory: Normal respiratory effort.  No retractions. Lungs CTAB. Gastrointestinal: Soft and nontender. No distention. Musculoskeletal: No lower extremity  tenderness nor edema.  Neurologic:  Normal speech and language. No gross focal neurologic deficits are appreciated.    ED Results / Procedures / Treatments   Labs (all labs ordered are listed, but only abnormal results are displayed) Labs Reviewed  COMPREHENSIVE METABOLIC PANEL - Abnormal; Notable for the following components:      Result Value   CO2 20 (*)    Glucose, Bld 164 (*)    Calcium 8.0 (*)    Total Bilirubin 1.2 (*)    Anion gap 17 (*)    All other components within normal limits  RESP PANEL BY RT-PCR (RSV, FLU A&B, COVID)  RVPGX2  CBC  URINALYSIS, ROUTINE W REFLEX MICROSCOPIC  CBG MONITORING, ED     EKG  ED ECG REPORT I, Chesley Noon, the attending physician, personally viewed and interpreted this ECG.   Date: 01/01/2023  EKG Time: 21:05  Rate: 108  Rhythm: sinus tachycardia  Axis: Normal  Intervals:none  ST&T Change: None  RADIOLOGY Chest x-ray reviewed and interpreted by me with no infiltrate, edema, or effusion.  PROCEDURES:  Critical Care performed: No  Procedures   MEDICATIONS ORDERED IN ED: Medications  lactated ringers bolus 1,000 mL (1,000 mLs  Intravenous New Bag/Given 01/01/23 2157)  ondansetron [facility]) injection 4 mg (4 mg Intravenous Given 01/01/23 2158)     IMPRESSION / MDM / ASSESSMENT AND PLAN / ED COURSE  I reviewed the triage vital signs and the nursing notes.                              47 y.o. male with past medical history of hypertension, diabetes, multiple sclerosis, and chronic pain syndrome who presents to the ED with generalized weakness, fatigue, cough, congestion, and diarrhea with nausea for the past 3 days.  Patient's presentation is most consistent with acute presentation with potential threat to life or bodily function.  Differential diagnosis includes, but is not limited to, sepsis, viral syndrome, pneumonia, UTI, dehydration, electrolyte abnormality, AKI.  Patient well-appearing and in no acute distress,  vital signs are unremarkable.  He appears clinically dehydrated, likely due to diarrhea, but he has a benign abdominal exam.  EKG shows sinus tachycardia with no ischemic changes, labs are reassuring with no significant anemia, leukocytosis, electrolyte abnormality, or AKI.  LFTs are unremarkable, suspect viral illness and testing is pending at this time.  We will treat with dose of IV Zofran and hydrate with IV fluids, reassess.  Viral testing is unremarkable, patient denies any symptoms of UTI.  He reports feeling better on reassessment and is tolerating oral intake without difficulty.  He is appropriate for discharge home with outpatient follow-up, was counseled to return to the ED for new or worsening symptoms.  Patient agrees with plan.      FINAL CLINICAL IMPRESSION(S) / ED DIAGNOSES   Final diagnoses:  Generalized weakness  Dehydration  Viral syndrome     Rx / DC Orders   ED Discharge Orders          Ordered    ondansetron (ZOFRAN-ODT) 4 MG disintegrating tablet  Every 8 hours PRN        01/01/23 2259             Note:  This document was prepared using Dragon voice recognition software and may include unintentional dictation errors.   Chesley Noon, MD 01/01/23 2300

## 2023-01-03 ENCOUNTER — Inpatient Hospital Stay
Admission: EM | Admit: 2023-01-03 | Discharge: 2023-01-06 | DRG: 638 | Disposition: A | Discharge status: Home / Self Care | Payer: Medicaid Other | Attending: Hospitalist | Admitting: Hospitalist

## 2023-01-03 ENCOUNTER — Emergency Department: Payer: Medicaid Other

## 2023-01-03 ENCOUNTER — Other Ambulatory Visit: Payer: Self-pay

## 2023-01-03 DIAGNOSIS — G894 Chronic pain syndrome: Secondary | ICD-10-CM | POA: Diagnosis present

## 2023-01-03 DIAGNOSIS — Z823 Family history of stroke: Secondary | ICD-10-CM | POA: Diagnosis not present

## 2023-01-03 DIAGNOSIS — E1010 Type 1 diabetes mellitus with ketoacidosis without coma: Secondary | ICD-10-CM | POA: Diagnosis not present

## 2023-01-03 DIAGNOSIS — Z79899 Other long term (current) drug therapy: Secondary | ICD-10-CM | POA: Diagnosis not present

## 2023-01-03 DIAGNOSIS — R0602 Shortness of breath: Secondary | ICD-10-CM | POA: Diagnosis present

## 2023-01-03 DIAGNOSIS — Z808 Family history of malignant neoplasm of other organs or systems: Secondary | ICD-10-CM | POA: Diagnosis not present

## 2023-01-03 DIAGNOSIS — Z9884 Bariatric surgery status: Secondary | ICD-10-CM

## 2023-01-03 DIAGNOSIS — R197 Diarrhea, unspecified: Secondary | ICD-10-CM | POA: Diagnosis present

## 2023-01-03 DIAGNOSIS — J45909 Unspecified asthma, uncomplicated: Secondary | ICD-10-CM | POA: Diagnosis present

## 2023-01-03 DIAGNOSIS — E7800 Pure hypercholesterolemia, unspecified: Secondary | ICD-10-CM | POA: Diagnosis present

## 2023-01-03 DIAGNOSIS — Z83438 Family history of other disorder of lipoprotein metabolism and other lipidemia: Secondary | ICD-10-CM | POA: Diagnosis not present

## 2023-01-03 DIAGNOSIS — Z6841 Body Mass Index (BMI) 40.0 and over, adult: Secondary | ICD-10-CM

## 2023-01-03 DIAGNOSIS — I10 Essential (primary) hypertension: Secondary | ICD-10-CM | POA: Diagnosis not present

## 2023-01-03 DIAGNOSIS — E860 Dehydration: Secondary | ICD-10-CM | POA: Diagnosis present

## 2023-01-03 DIAGNOSIS — Z9641 Presence of insulin pump (external) (internal): Secondary | ICD-10-CM | POA: Diagnosis present

## 2023-01-03 DIAGNOSIS — E1110 Type 2 diabetes mellitus with ketoacidosis without coma: Secondary | ICD-10-CM | POA: Diagnosis present

## 2023-01-03 DIAGNOSIS — G35 Multiple sclerosis: Secondary | ICD-10-CM | POA: Diagnosis present

## 2023-01-03 DIAGNOSIS — G35D Multiple sclerosis, unspecified: Secondary | ICD-10-CM | POA: Diagnosis present

## 2023-01-03 DIAGNOSIS — Z794 Long term (current) use of insulin: Secondary | ICD-10-CM

## 2023-01-03 DIAGNOSIS — Z803 Family history of malignant neoplasm of breast: Secondary | ICD-10-CM | POA: Diagnosis not present

## 2023-01-03 DIAGNOSIS — Z8614 Personal history of Methicillin resistant Staphylococcus aureus infection: Secondary | ICD-10-CM

## 2023-01-03 DIAGNOSIS — E8889 Other specified metabolic disorders: Secondary | ICD-10-CM | POA: Diagnosis present

## 2023-01-03 DIAGNOSIS — E1069 Type 1 diabetes mellitus with other specified complication: Secondary | ICD-10-CM | POA: Diagnosis present

## 2023-01-03 DIAGNOSIS — I152 Hypertension secondary to endocrine disorders: Secondary | ICD-10-CM | POA: Diagnosis present

## 2023-01-03 DIAGNOSIS — Z833 Family history of diabetes mellitus: Secondary | ICD-10-CM

## 2023-01-03 DIAGNOSIS — Z89512 Acquired absence of left leg below knee: Secondary | ICD-10-CM | POA: Diagnosis not present

## 2023-01-03 DIAGNOSIS — K219 Gastro-esophageal reflux disease without esophagitis: Secondary | ICD-10-CM | POA: Diagnosis present

## 2023-01-03 DIAGNOSIS — E1310 Other specified diabetes mellitus with ketoacidosis without coma: Secondary | ICD-10-CM | POA: Diagnosis not present

## 2023-01-03 DIAGNOSIS — E1159 Type 2 diabetes mellitus with other circulatory complications: Secondary | ICD-10-CM | POA: Diagnosis present

## 2023-01-03 DIAGNOSIS — Z8249 Family history of ischemic heart disease and other diseases of the circulatory system: Secondary | ICD-10-CM

## 2023-01-03 DIAGNOSIS — E1065 Type 1 diabetes mellitus with hyperglycemia: Secondary | ICD-10-CM

## 2023-01-03 DIAGNOSIS — Z9049 Acquired absence of other specified parts of digestive tract: Secondary | ICD-10-CM

## 2023-01-03 LAB — BASIC METABOLIC PANEL
Anion gap: 17 — ABNORMAL HIGH (ref 5–15)
Anion gap: 15 (ref 5–15)
Anion gap: 13 (ref 5–15)
Anion gap: 12 (ref 5–15)
BUN: 17 mg/dL (ref 6–20)
BUN: 16 mg/dL (ref 6–20)
BUN: 15 mg/dL (ref 6–20)
BUN: 14 mg/dL (ref 6–20)
CO2: 18 mmol/L — ABNORMAL LOW (ref 22–32)
CO2: 20 mmol/L — ABNORMAL LOW (ref 22–32)
CO2: 24 mmol/L (ref 22–32)
CO2: 24 mmol/L (ref 22–32)
Calcium: 8.3 mg/dL — ABNORMAL LOW (ref 8.9–10.3)
Calcium: 8.1 mg/dL — ABNORMAL LOW (ref 8.9–10.3)
Calcium: 8.2 mg/dL — ABNORMAL LOW (ref 8.9–10.3)
Calcium: 8.4 mg/dL — ABNORMAL LOW (ref 8.9–10.3)
Chloride: 96 mmol/L — ABNORMAL LOW (ref 98–111)
Chloride: 94 mmol/L — ABNORMAL LOW (ref 98–111)
Chloride: 96 mmol/L — ABNORMAL LOW (ref 98–111)
Chloride: 96 mmol/L — ABNORMAL LOW (ref 98–111)
Creatinine, Ser: 0.99 mg/dL (ref 0.61–1.24)
Creatinine, Ser: 0.98 mg/dL (ref 0.61–1.24)
Creatinine, Ser: 1 mg/dL (ref 0.61–1.24)
Creatinine, Ser: 0.9 mg/dL (ref 0.61–1.24)
GFR, Estimated: >60 mL/min (ref >60)
GFR, Estimated: >60 mL/min (ref >60)
GFR, Estimated: >60 mL/min (ref >60)
GFR, Estimated: >60 mL/min (ref >60)
Glucose, Bld: 140 mg/dL — ABNORMAL HIGH (ref 70–99)
Glucose, Bld: 142 mg/dL — ABNORMAL HIGH (ref 70–99)
Glucose, Bld: 153 mg/dL — ABNORMAL HIGH (ref 70–99)
Glucose, Bld: 119 mg/dL — ABNORMAL HIGH (ref 70–99)
Potassium: 4.6 mmol/L (ref 3.5–5.1)
Potassium: 4.4 mmol/L (ref 3.5–5.1)
Potassium: 4.7 mmol/L (ref 3.5–5.1)
Potassium: 4.2 mmol/L (ref 3.5–5.1)
Sodium: 131 mmol/L — ABNORMAL LOW (ref 135–145)
Sodium: 129 mmol/L — ABNORMAL LOW (ref 135–145)
Sodium: 133 mmol/L — ABNORMAL LOW (ref 135–145)
Sodium: 132 mmol/L — ABNORMAL LOW (ref 135–145)

## 2023-01-03 LAB — RESP PANEL BY RT-PCR (RSV, FLU A&B, COVID)  RVPGX2
Influenza A by PCR: NEGATIVE
Influenza B by PCR: NEGATIVE
Resp Syncytial Virus by PCR: NEGATIVE
SARS Coronavirus 2 by RT PCR: NEGATIVE

## 2023-01-03 LAB — BLOOD GAS, VENOUS
Acid-base deficit: 8.1 mmol/L — ABNORMAL HIGH (ref 0.0–2.0)
Bicarbonate: 17.8 mmol/L — ABNORMAL LOW (ref 20.0–28.0)
O2 Saturation: 67 %
Patient temperature: 37
pCO2, Ven: 37 mm[Hg] — ABNORMAL LOW (ref 44–60)
pH, Ven: 7.29 (ref 7.25–7.43)
pO2, Ven: 42 mm[Hg] (ref 32–45)

## 2023-01-03 LAB — COMPREHENSIVE METABOLIC PANEL
ALT: 32 U/L (ref 0–44)
AST: 49 U/L — ABNORMAL HIGH (ref 15–41)
Albumin: 3.4 g/dL — ABNORMAL LOW (ref 3.5–5.0)
Alkaline Phosphatase: 81 U/L (ref 38–126)
Anion gap: 21 — ABNORMAL HIGH (ref 5–15)
BUN: 17 mg/dL (ref 6–20)
CO2: 15 mmol/L — ABNORMAL LOW (ref 22–32)
Calcium: 7.9 mg/dL — ABNORMAL LOW (ref 8.9–10.3)
Chloride: 94 mmol/L — ABNORMAL LOW (ref 98–111)
Creatinine, Ser: 1.04 mg/dL (ref 0.61–1.24)
GFR, Estimated: >60 mL/min (ref >60)
Glucose, Bld: 147 mg/dL — ABNORMAL HIGH (ref 70–99)
Potassium: 4.8 mmol/L (ref 3.5–5.1)
Sodium: 130 mmol/L — ABNORMAL LOW (ref 135–145)
Total Bilirubin: 1.9 mg/dL — ABNORMAL HIGH (ref <1.2)
Total Protein: 6.3 g/dL — ABNORMAL LOW (ref 6.5–8.1)

## 2023-01-03 LAB — URINALYSIS, ROUTINE W REFLEX MICROSCOPIC
Bacteria, UA: NONE SEEN
Bilirubin Urine: NEGATIVE
Glucose, UA: NEGATIVE mg/dL
Ketones, ur: 20 mg/dL — AB
Leukocytes,Ua: NEGATIVE
Nitrite: NEGATIVE
Protein, ur: 30 mg/dL — AB
Specific Gravity, Urine: 1.013 (ref 1.005–1.030)
Squamous Epithelial / HPF: 0 /[HPF] (ref 0–5)
pH: 6 (ref 5.0–8.0)

## 2023-01-03 LAB — CBG MONITORING, ED
Glucose-Capillary: 107 mg/dL — ABNORMAL HIGH (ref 70–99)
Glucose-Capillary: 115 mg/dL — ABNORMAL HIGH (ref 70–99)
Glucose-Capillary: 132 mg/dL — ABNORMAL HIGH (ref 70–99)
Glucose-Capillary: 133 mg/dL — ABNORMAL HIGH (ref 70–99)
Glucose-Capillary: 138 mg/dL — ABNORMAL HIGH (ref 70–99)
Glucose-Capillary: 142 mg/dL — ABNORMAL HIGH (ref 70–99)
Glucose-Capillary: 144 mg/dL — ABNORMAL HIGH (ref 70–99)
Glucose-Capillary: 146 mg/dL — ABNORMAL HIGH (ref 70–99)
Glucose-Capillary: 150 mg/dL — ABNORMAL HIGH (ref 70–99)

## 2023-01-03 LAB — T4, FREE: Free T4: 0.81 ng/dL (ref 0.61–1.12)

## 2023-01-03 LAB — BETA-HYDROXYBUTYRIC ACID
Beta-Hydroxybutyric Acid: 5.51 mmol/L — ABNORMAL HIGH (ref 0.05–0.27)
Beta-Hydroxybutyric Acid: 4.22 mmol/L — ABNORMAL HIGH (ref 0.05–0.27)
Beta-Hydroxybutyric Acid: 2.45 mmol/L — ABNORMAL HIGH (ref 0.05–0.27)

## 2023-01-03 LAB — TROPONIN I (HIGH SENSITIVITY)
Troponin I (High Sensitivity): 3 ng/L (ref <18)
Troponin I (High Sensitivity): 3 ng/L (ref <18)

## 2023-01-03 LAB — CBC
HCT: 43.6 % (ref 39.0–52.0)
Hemoglobin: 13.9 g/dL (ref 13.0–17.0)
MCH: 31.2 pg (ref 26.0–34.0)
MCHC: 31.9 g/dL (ref 30.0–36.0)
MCV: 98 fL (ref 80.0–100.0)
Platelets: 256 10*3/uL (ref 150–400)
RBC: 4.45 MIL/uL (ref 4.22–5.81)
RDW: 13.5 % (ref 11.5–15.5)
WBC: 10.4 10*3/uL (ref 4.0–10.5)
nRBC: 0 % (ref 0.0–0.2)

## 2023-01-03 LAB — LACTIC ACID, PLASMA
Lactic Acid, Venous: 5.3 mmol/L (ref 0.5–1.9)
Lactic Acid, Venous: 2.8 mmol/L (ref 0.5–1.9)

## 2023-01-03 LAB — HIV ANTIBODY (ROUTINE TESTING W REFLEX): HIV Screen 4th Generation wRfx: NONREACTIVE

## 2023-01-03 MED ORDER — POTASSIUM CHLORIDE 10 MEQ/100ML IV SOLN
10.0000 meq | INTRAVENOUS | Status: DC
  Administered 2023-01-03 (×2): 10 meq via INTRAVENOUS
  Filled 2023-01-03 (×2): qty 100

## 2023-01-03 MED ORDER — ONDANSETRON HCL 4 MG PO TABS: 4.0000 mg | ORAL_TABLET | Freq: Four times a day (QID) | ORAL | Status: DC | PRN

## 2023-01-03 MED ORDER — LACTATED RINGERS IV BOLUS
1000.0000 mL | Freq: Once | INTRAVENOUS | Status: AC
  Administered 2023-01-03: 1000 mL via INTRAVENOUS

## 2023-01-03 MED ORDER — DEXTROSE 50 % IV SOLN: 0.0000 mL | INTRAVENOUS | Status: DC | PRN

## 2023-01-03 MED ORDER — DEXTROSE IN LACTATED RINGERS 5 % IV SOLN: INTRAVENOUS | Status: AC

## 2023-01-03 MED ORDER — LACTATED RINGERS IV SOLN: INTRAVENOUS | Status: AC

## 2023-01-03 MED ORDER — INSULIN REGULAR(HUMAN) IN NACL 100-0.9 UT/100ML-% IV SOLN
INTRAVENOUS | Status: DC
  Administered 2023-01-03: 2.2 [IU]/h via INTRAVENOUS
  Administered 2023-01-04: 5 [IU]/h via INTRAVENOUS
  Administered 2023-01-05: 3.4 [IU]/h via INTRAVENOUS
  Filled 2023-01-03: qty 100

## 2023-01-03 MED ORDER — IPRATROPIUM-ALBUTEROL 0.5-2.5 (3) MG/3ML IN SOLN
3.0000 mL | Freq: Once | RESPIRATORY_TRACT | Status: AC
  Administered 2023-01-03: 3 mL via RESPIRATORY_TRACT
  Filled 2023-01-03: qty 3

## 2023-01-03 MED ORDER — ENOXAPARIN SODIUM 40 MG/0.4ML IJ SOSY: 40.0000 mg | PREFILLED_SYRINGE | INTRAMUSCULAR | Status: DC

## 2023-01-03 MED ORDER — LACTATED RINGERS IV BOLUS: 20.0000 mL/kg | Freq: Once | INTRAVENOUS | Status: DC

## 2023-01-03 MED ORDER — HYDROCODONE-ACETAMINOPHEN 5-325 MG PO TABS
1.0000 | ORAL_TABLET | Freq: Four times a day (QID) | ORAL | Status: DC | PRN
  Administered 2023-01-03 – 2023-01-06 (×4): 1 via ORAL
  Filled 2023-01-03 (×4): qty 1

## 2023-01-03 MED ORDER — LACTATED RINGERS IV SOLN: INTRAVENOUS | Status: DC

## 2023-01-03 MED ORDER — POTASSIUM CHLORIDE 10 MEQ/100ML IV SOLN
10.0000 meq | INTRAVENOUS | Status: AC
  Administered 2023-01-03 (×2): 10 meq via INTRAVENOUS
  Filled 2023-01-03 (×2): qty 100

## 2023-01-03 MED ORDER — DEXTROSE IN LACTATED RINGERS 5 % IV SOLN: INTRAVENOUS | Status: DC

## 2023-01-03 MED ORDER — INSULIN REGULAR(HUMAN) IN NACL 100-0.9 UT/100ML-% IV SOLN
INTRAVENOUS | Status: DC
  Administered 2023-01-03: 2.4 [IU]/h via INTRAVENOUS
  Filled 2023-01-03: qty 100

## 2023-01-03 MED ORDER — ONDANSETRON HCL 4 MG/2ML IJ SOLN
4.0000 mg | Freq: Four times a day (QID) | INTRAMUSCULAR | Status: DC | PRN
  Administered 2023-01-04: 4 mg via INTRAVENOUS
  Filled 2023-01-03: qty 2

## 2023-01-03 MED ORDER — LACTATED RINGERS IV BOLUS
20.0000 mL/kg | Freq: Once | INTRAVENOUS | Status: AC
Start: 2023-01-03 — End: 2023-01-03
  Administered 2023-01-03: 2948 mL via INTRAVENOUS

## 2023-01-03 MED ORDER — ENOXAPARIN SODIUM 80 MG/0.8ML IJ SOSY
75.0000 mg | PREFILLED_SYRINGE | INTRAMUSCULAR | Status: DC
  Administered 2023-01-03 – 2023-01-05 (×3): 75 mg via SUBCUTANEOUS
  Filled 2023-01-03 (×2): qty 0.75
  Filled 2023-01-03 (×2): qty 0.8

## 2023-01-03 NOTE — H&P (Addendum)
History and Physical    Patient: Michael Hutchinson WUJ:[id number] DOB: 11-25-1975 DOA: 01/03/2023 DOS: the patient was seen and examined on 01/03/2023 PCP: Melida Quitter, PA  Patient coming from: Home  Chief Complaint:  Chief Complaint  Patient presents with   Shortness of Breath   HPI: Michael Hutchinson is a 47 y.o. male with medical history significant of obesity, HTN, type 1 diabetes presenting w/ euglycemic DKA.  Patient reports recurrent nausea vomiting diarrhea over the past 3 to 4 days.  No reported sick contacts.  No fevers or chills.  Minimal abdominal pain.  No chest pain or shortness of breath.  Baseline type 1 diabetes on insulin pump.  States that insulin pump has had low batteries over the past few days.  Blood sugars have remained stable in the 130s.  No reported change in diet.  Diarrhea nonbilious nonbloody.  No reported recent medication change.  No reported alcohol or tobacco use. Presented to the ER afebrile, heart rate 100s, BP stable.  Satting well on room air.  White count 10.4, hemoglobin 13.9, platelets 256, lactate 5.3-2.8.  Creatinine 0.99.  Glucose 140s.  Bicarb 15.  VBG with compensated metabolic acidosis.  Troponin negative x 1.  BHB 5.5.  COVID flu and RSV negative.  Urinalysis pending. Review of Systems: As mentioned in the history of present illness. All other systems reviewed and are negative. Past Medical History:  Diagnosis Date   Charcot's joint of foot 11/25/2013   Complication of anesthesia    "I wake up angry" (12/31/2015)   Diabetes mellitus type 1 (HCC) dx'd 1981   Diabetic ketoacidosis (HCC)    Essential hypertension 05/03/2013   GERD (gastroesophageal reflux disease)    High cholesterol    Hx MRSA infection    Inner thigh and under arm- healed areas   Meniscus tear    Past Surgical History:  Procedure Laterality Date   ABDOMINAL SURGERY     AMPUTATION Left 01/01/2016   Procedure: AMPUTATION BELOW KNEE;  Surgeon: Toni Arthurs, MD;  Location:  [facility];  Service: Orthopedics;  Laterality: Left;   APPLICATION OF WOUND VAC  04/26/2016   CARPAL TUNNEL WITH CUBITAL TUNNEL Right 10/22/2019   Procedure: Right carpal tunnel release, right cubital tunnel release in situ, Right pronator/proximal median nerve release, Right elbow ECRB debridement with partial ostectomy and repair as necessary;  Surgeon: Dominica Severin, MD;  Location: [facility] OR;  Service: Orthopedics;  Laterality: Right;    GASTRIC ROUX-EN-Y N/A 12/14/2020   Procedure: LAPAROSCOPIC ROUX-EN-Y GASTRIC BYPASS WITH UPPER ENDOSCOPY;  Surgeon: Luretha Murphy, MD;  Location: [facility];  Service: General;  Laterality: N/A;   HERNIA REPAIR     I & D EXTREMITY Left 04/26/2016   Procedure: IRRIGATION AND DEBRIDEMENT EXTREMITY/Left Leg/Possible Wound Vac;  Surgeon: Toni Arthurs, MD;  Location: [facility];  Service: Orthopedics;  Laterality: Left;   INCISE AND DRAIN ABCESS Left 04/26/2016   KNEE ARTHROSCOPY Left ~ 2010   LAPAROSCOPIC CHOLECYSTECTOMY  2015   METACARPOPHALANGEAL JOINT ARTHRODESIS Left 06/2012   Fracture left index finger intra-articular MCP joint/notes 06/30/2012   OPEN REDUCTION INTERNAL FIXATION (ORIF) PROXIMAL PHALANX Left 06/30/2012   Procedure: OPEN REDUCTION INTERNAL FIXATION (ORIF) LEFT INDEX FINGER PROXIMAL PHALANX FRACTURE WITH LIGAMENT REPAIR AS NECESSARY;  Surgeon: Dominica Severin, MD;  Location: [facility];  Service: Orthopedics;  Laterality: Left;   UMBILICAL HERNIA REPAIR  2015   "w/gallbladder OR"   VENTRAL HERNIA REPAIR N/A 11/29/2021   Procedure: LAPAROSCOPIC VENTRAL HERNIA  REPAIR WITH MESH;  Surgeon: Luretha Murphy, MD;  Location: [facility];  Service: General;  Laterality: N/A;   Social History:  reports that he has never smoked. He has never been exposed to tobacco smoke. He quit smokeless tobacco use about 12 years ago.  His smokeless tobacco use included snuff and chew. He reports current alcohol use of about 12.0 standard drinks of alcohol per week. He reports that he  does not use drugs.  Allergies  Allergen Reactions   Influenza Vac Split Quad Nausea And Vomiting   Influenza Virus Vaccine Split Nausea And Vomiting   Statins Other (See Comments)    Myalgias   Hydrochlorothiazide Other (See Comments)    Dizziness   Lisinopril Cough    Family History  Problem Relation Age of Onset   Other Mother    Cancer Mother        Breast / Bone   Heart attack Father    Hypertension Father    Hyperlipidemia Father    Diabetes Other    Alcohol abuse Sister    Diabetes Maternal Grandfather    Stroke Paternal Grandmother    Alcohol abuse Paternal Grandfather     Prior to Admission medications   Medication Sig Start Date End Date Taking? Authorizing Provider  albuterol (VENTOLIN HFA) 108 (90 Base) MCG/ACT inhaler Inhale 2 puffs into the lungs every 6 (six) hours as needed for wheezing or shortness of breath. 12/12/22  Yes Jene Every, MD  amLODipine (NORVASC) 5 MG tablet Take 1 tablet (5 mg total) by mouth daily. 10/04/22  Yes Melida Quitter, PA  cetirizine (ZYRTEC ALLERGY) 10 MG tablet Take 1 tablet (10 mg total) by mouth daily. 12/11/22 01/10/23 Yes McLean, Darius, PA-C  ezetimibe (ZETIA) 10 MG tablet Take 1 tablet (10 mg total) by mouth daily. 10/04/22  Yes Saralyn Pilar A, PA  fluticasone (FLONASE) 50 MCG/ACT nasal spray Place 2 sprays into both nostrils daily. 12/11/22  Yes McLean, Darius, PA-C  [name] 200 UNIT/ML KwikPen Inject 0-40 Units into the skin daily. 12/29/22  Yes [provider]  HYDROcodone-acetaminophen (NORCO/VICODIN) 5-325 MG tablet Take 1 tablet by mouth every 4 (four) hours as needed. 11/29/21  Yes Luretha Murphy, MD  losartan (COZAAR) 50 MG tablet TAKE 1 TABLET BY MOUTH ONCE DAILY 10/04/22  Yes Saralyn Pilar A, PA  metoprolol succinate (TOPROL-XL) 100 MG 24 hr tablet Take 1 tablet (100 mg total) by mouth daily. TAKE WITH OR IMMEDIATELY FOLLOWING A MEAL. 10/04/22  Yes Saralyn Pilar A, PA  OCREVUS 300 MG/10ML  injection Inject 300 mg into the vein every 6 (six) months. 09/01/22  Yes [provider]  ondansetron (ZOFRAN-ODT) 4 MG disintegrating tablet Take 1 tablet (4 mg total) by mouth every 8 (eight) hours as needed for nausea or vomiting. 01/01/23  Yes Chesley Noon, MD  rizatriptan (MAXALT-MLT) 10 MG disintegrating tablet Take 1 tablet earliest onset of headache.  May repeat after 2 hours if needed. Maximum 2 tablets in 24 hours 02/10/21  Yes Jaffe, Adam R, DO  Blood Pressure Monitoring (BLOOD PRESSURE MONITOR/L CUFF) MISC Use blood pressure cuff and monitor to check blood pressure at home at least several times per week if not daily. 04/04/22   Melida Quitter, PA  Continuous Blood Gluc Receiver (DEXCOM G6 RECEIVER) DEVI See admin instructions. 04/28/21   [provider]  Continuous Blood Gluc Sensor (DEXCOM G6 SENSOR) MISC SMARTSIG:1 Topical Every 10 Days 04/27/21   [provider]  Continuous Blood Gluc Transmit (  DEXCOM G6 TRANSMITTER) MISC DISPENSE AND USE AS DIRECTED 04/27/21   [provider]  glucose blood (BAYER CONTOUR NEXT TEST) test strip 1 each by Other route 4 (four) times daily. And lancets 4/day 10/19/16   Romero Belling, MD  Insulin Disposable Pump (OMNIPOD 5 G6 PODS, GEN 5,) MISC Inject 1 Device into the skin daily. 04/13/22   [provider]  Insulin Pen Needle (PEN NEEDLES) 32G X 4 MM MISC USE 5 TIMES DAILY AS DIRECTED 07/07/22   [provider]  Insulin Syringes, Disposable, U-100 1 ML MISC 3x daily 06/28/18   Shamleffer, Konrad Dolores, MD  Multiple Vitamins-Minerals (MULTIVITAMIN WITH MINERALS) tablet Take 1 tablet by mouth daily.    [provider]  omeprazole (PRILOSEC OTC) 20 MG tablet Take 20 mg by mouth every other day. Patient not taking: Reported on 01/03/2023    [provider]    Physical Exam: Vitals:   01/03/23 0710 01/03/23 0712  BP:  (!) 95/47  Pulse: (!) 101   Resp: 18   Temp: 98.7 F (37.1 C)    TempSrc: Oral   SpO2: 99%   Weight: (!) 147.4 kg   Height: 6' (1.829 m)    Physical Exam Constitutional:      Appearance: He is obese.  HENT:     Head: Normocephalic and atraumatic.     Nose: Nose normal.  Eyes:     Pupils: Pupils are equal, round, and reactive to light.  Cardiovascular:     Rate and Rhythm: Normal rate and regular rhythm.  Pulmonary:     Effort: Pulmonary effort is normal.  Abdominal:     General: Bowel sounds are normal.  Musculoskeletal:        General: Normal range of motion.     Comments: S/p L foot amputation    Skin:    General: Skin is dry.  Neurological:     General: No focal deficit present.  Psychiatric:        Mood and Affect: Mood normal.     Data Reviewed:  There are no new results to review at this time.  DG Chest 2 View CLINICAL DATA:  47 year old male with history of shortness of breath.  EXAM: CHEST - 2 VIEW  COMPARISON:  Chest x-ray 01/01/2023.  FINDINGS: Lung volumes are low. No consolidative airspace disease. No pleural effusions. No pneumothorax. No pulmonary nodule or mass noted. Pulmonary vasculature and the cardiomediastinal silhouette are within normal limits.  IMPRESSION: 1. Low lung volumes without radiographic evidence of acute cardiopulmonary disease.  Electronically Signed   By: Trudie Reed M.D.   On: 01/03/2023 07:58  Lab Results  Component Value Date   WBC 10.4 01/03/2023   HGB 13.9 01/03/2023   HCT 43.6 01/03/2023   MCV 98.0 01/03/2023   PLT 256 01/03/2023   Last metabolic panel Lab Results  Component Value Date   GLUCOSE 140 (H) 01/03/2023   NA 131 (L) 01/03/2023   K 4.6 01/03/2023   CL 96 (L) 01/03/2023   CO2 18 (L) 01/03/2023   BUN 17 01/03/2023   CREATININE 0.99 01/03/2023   GFRNONAA >60 01/03/2023   CALCIUM 8.3 (L) 01/03/2023   PROT 6.3 (L) 01/03/2023   ALBUMIN 3.4 (L) 01/03/2023   LABGLOB 2.1 11/28/2022   AGRATIO 1.8 09/03/2021   BILITOT 1.9 (H) 01/03/2023   ALKPHOS 81  01/03/2023   AST 49 (H) 01/03/2023   ALT 32 01/03/2023   ANIONGAP 17 (H) 01/03/2023    Assessment and  Plan: * DKA (diabetic ketoacidosis) (HCC) Pt presenting w/ euglycemic DKA w/ noted sugars in 130s, bicarb 15, Urine + ketones Clinically dry  Noted vomiting and diarrhea x multiple days w/ dying battery on insulin pump  BHB 5.5  Will start on DKA protocol  Continue pending normalization of acidosis and anion gap Resume insulin pump as appropriate pending diabetic coordinator consult   Multiple sclerosis (HCC) Stable  Monitor    Hypertension associated with diabetes (HCC) BP stable  Titrate home regimen    GERD (gastroesophageal reflux disease) PPI      Advance Care Planning:   Code Status: Full Code   Consults: None   Family Communication: Wife at the bedside   Severity of Illness: The appropriate patient status for this patient is INPATIENT. Inpatient status is judged to be reasonable and necessary in order to provide the required intensity of service to ensure the patient's safety. The patient's presenting symptoms, physical exam findings, and initial radiographic and laboratory data in the context of their chronic comorbidities is felt to place them at high risk for further clinical deterioration. Furthermore, it is not anticipated that the patient will be medically stable for discharge from the hospital within 2 midnights of admission.   * I certify that at the point of admission it is my clinical judgment that the patient will require inpatient hospital care spanning beyond 2 midnights from the point of admission due to high intensity of service, high risk for further deterioration and high frequency of surveillance required.*  Author: Floydene Flock, MD 01/03/2023 2:28 PM  For on call review [email or web address].

## 2023-01-03 NOTE — Assessment & Plan Note (Signed)
BP stable Titrate home regimen 

## 2023-01-03 NOTE — Progress Notes (Signed)
PHARMACIST - PHYSICIAN COMMUNICATION  CONCERNING:  Enoxaparin (Lovenox) for DVT Prophylaxis    RECOMMENDATION: Patient was prescribed enoxaprin 40mg  q24 hours for VTE prophylaxis.   Filed Weights   01/03/23 0710  Weight: (!) 147.4 kg (324 lb 15.3 oz)    Body mass index is 44.07 kg/m.  Estimated Creatinine Clearance: 140.6 mL/min (by C-G formula based on SCr of 0.99 mg/dL).   Based on [facility] policy patient is candidate for enoxaparin 0.5mg /kg TBW SQ every 24 hours based on BMI being >30.   DESCRIPTION: Pharmacy has adjusted enoxaparin dose per [facility] policy.  Patient is now receiving enoxaparin 0.5 mg/kg every 24 hours    Lowella Bandy, PharmD Clinical Pharmacist  01/03/2023 2:26 PM

## 2023-01-03 NOTE — Assessment & Plan Note (Signed)
PPI ?

## 2023-01-03 NOTE — ED Notes (Signed)
Reports Improvement in breathing after duoneb.

## 2023-01-03 NOTE — Assessment & Plan Note (Signed)
Stable.  Monitor.  

## 2023-01-03 NOTE — ED Notes (Signed)
Dr. Vicente Males aware of Lactic result.

## 2023-01-03 NOTE — ED Provider Notes (Signed)
[facility] Provider Note   Event Date/Time   First MD Initiated Contact with Patient 01/03/23 [phone]     (approximate) History  Shortness of Breath  HPI Michael Hutchinson is a 47 y.o. male with a past medical history of MS, asthma, and type 1 diabetes on insulin pump who presents complaining of generalized malaise, decreased p.o. intake, shortness of breath, and pleuritic chest pain.  Patient states that he has had cough, congestion, vomiting, diarrhea over the last 5 days and now feels that anytime he takes a deep breath he has significant chest pain. ROS: Patient currently denies any vision changes, tinnitus, difficulty speaking, facial droop, sore throat, abdominal pain, nausea/vomiting/diarrhea, dysuria, or weakness/numbness/paresthesias in any extremity   Physical Exam  Triage Vital Signs: ED Triage Vitals  Encounter Vitals Group     BP 01/03/23 0712 (!) 95/47     Systolic BP Percentile --      Diastolic BP Percentile --      Pulse Rate 01/03/23 0710 (!) 101     Resp 01/03/23 0710 18     Temp 01/03/23 0710 98.7 F (37.1 C)     Temp Source 01/03/23 0710 Oral     SpO2 01/03/23 0710 99 %     Weight 01/03/23 0710 (!) 324 lb 15.3 oz (147.4 kg)     Height 01/03/23 0710 6' (1.829 m)     Head Circumference --      Peak Flow --      Pain Score 01/03/23 0710 7     Pain Loc --      Pain Education --      Exclude from Growth Chart --    Most recent vital signs: Vitals:   01/03/23 0712 01/03/23 1430  BP: (!) 95/47   Pulse:    Resp:  (!) 29  Temp:    SpO2:     General: Awake, oriented x4. CV:  Good peripheral perfusion.  Resp:  Normal effort.  Clear to auscultation bilaterally Abd:  No distention.  Other:  Obese middle-aged Caucasian male resting comfortably in no acute distress.  Left BKA ED Results / Procedures / Treatments  Labs (all labs ordered are listed, but only abnormal results are displayed) Labs Reviewed  COMPREHENSIVE METABOLIC PANEL -  Abnormal; Notable for the following components:      Result Value   Sodium 130 (*)    Chloride 94 (*)    CO2 15 (*)    Glucose, Bld 147 (*)    Calcium 7.9 (*)    Total Protein 6.3 (*)    Albumin 3.4 (*)    AST 49 (*)    Total Bilirubin 1.9 (*)    Anion gap 21 (*)    All other components within normal limits  BLOOD GAS, VENOUS - Abnormal; Notable for the following components:   pCO2, Ven 37 (*)    Bicarbonate 17.8 (*)    Acid-base deficit 8.1 (*)    All other components within normal limits  BETA-HYDROXYBUTYRIC ACID - Abnormal; Notable for the following components:   Beta-Hydroxybutyric Acid 5.51 (*)    All other components within normal limits  LACTIC ACID, PLASMA - Abnormal; Notable for the following components:   Lactic Acid, Venous 5.3 (*)    All other components within normal limits  LACTIC ACID, PLASMA - Abnormal; Notable for the following components:   Lactic Acid, Venous 2.8 (*)    All other components within normal limits  BASIC METABOLIC PANEL -  Abnormal; Notable for the following components:   Sodium 131 (*)    Chloride 96 (*)    CO2 18 (*)    Glucose, Bld 140 (*)    Calcium 8.3 (*)    Anion gap 17 (*)    All other components within normal limits  CBG MONITORING, ED - Abnormal; Notable for the following components:   Glucose-Capillary 132 (*)    All other components within normal limits  CBG MONITORING, ED - Abnormal; Notable for the following components:   Glucose-Capillary 133 (*)    All other components within normal limits  CBG MONITORING, ED - Abnormal; Notable for the following components:   Glucose-Capillary 138 (*)    All other components within normal limits  RESP PANEL BY RT-PCR (RSV, FLU A&B, COVID)  RVPGX2  CBC  T4, FREE  URINALYSIS, ROUTINE W REFLEX MICROSCOPIC  HIV ANTIBODY (ROUTINE TESTING W REFLEX)  BASIC METABOLIC PANEL  BASIC METABOLIC PANEL  BASIC METABOLIC PANEL  BASIC METABOLIC PANEL  BETA-HYDROXYBUTYRIC ACID  BETA-HYDROXYBUTYRIC  ACID  TROPONIN I (HIGH SENSITIVITY)  TROPONIN I (HIGH SENSITIVITY)   EKG ED ECG REPORT I, Merwyn Katos, the attending physician, personally viewed and interpreted this ECG. Date: 01/03/2023 EKG Time: 0707 Rate: 105 Rhythm: normal sinus rhythm QRS Axis: normal Intervals: normal ST/T Wave abnormalities: normal Narrative Interpretation: no evidence of acute ischemia RADIOLOGY ED MD interpretation: 2 view chest x-ray interpreted independently and shows low lung volumes without radiographic evidence of acute cardiopulmonary disease -Agree with radiology assessment Official radiology report(s): DG Chest 2 View Result Date: 01/03/2023 CLINICAL DATA:  47 year old male with history of shortness of breath. EXAM: CHEST - 2 VIEW COMPARISON:  Chest x-ray 01/01/2023. FINDINGS: Lung volumes are low. No consolidative airspace disease. No pleural effusions. No pneumothorax. No pulmonary nodule or mass noted. Pulmonary vasculature and the cardiomediastinal silhouette are within normal limits. IMPRESSION: 1. Low lung volumes without radiographic evidence of acute cardiopulmonary disease. Electronically Signed   By: Trudie Reed M.D.   On: 01/03/2023 07:58   PROCEDURES: Critical Care performed: Yes, see critical care procedure note(s) .1-3 Lead EKG Interpretation  Performed by: Merwyn Katos, MD Authorized by: Merwyn Katos, MD     Interpretation: normal     ECG rate:  71   ECG rate assessment: normal     Rhythm: sinus rhythm     Ectopy: none     Conduction: normal   CRITICAL CARE Performed by: Merwyn Katos  Total critical care time: 37 minutes  Critical care time was exclusive of separately billable procedures and treating other patients.  Critical care was necessary to treat or prevent imminent or life-threatening deterioration.  Critical care was time spent personally by me on the following activities: development of treatment plan with patient and/or surrogate as well as  nursing, discussions with consultants, evaluation of patient's response to treatment, examination of patient, obtaining history from patient or surrogate, ordering and performing treatments and interventions, ordering and review of laboratory studies, ordering and review of radiographic studies, pulse oximetry and re-evaluation of patient's condition.  MEDICATIONS ORDERED IN ED: Medications  dextrose 5 % in lactated ringers infusion ( Intravenous New Bag/Given 01/03/23 1313)  insulin regular, human (MYXREDLIN) 100 units/ 100 mL infusion (2.2 Units/hr Intravenous New Bag/Given 01/03/23 1450)  lactated ringers infusion (has no administration in time range)  dextrose 5 % in lactated ringers infusion (has no administration in time range)  dextrose 50 % solution 0-50 mL (has no administration in  time range)  potassium chloride 10 mEq in 100 mL IVPB (has no administration in time range)  enoxaparin (LOVENOX) injection 75 mg (has no administration in time range)  ondansetron (ZOFRAN) tablet 4 mg (has no administration in time range)    Or  ondansetron (ZOFRAN) injection 4 mg (has no administration in time range)  HYDROcodone-acetaminophen (NORCO/VICODIN) 5-325 MG per tablet 1 tablet (has no administration in time range)  lactated ringers bolus 1,000 mL (0 mLs Intravenous Stopped 01/03/23 1304)  ipratropium-albuterol (DUONEB) 0.5-2.5 (3) MG/3ML nebulizer solution 3 mL (3 mLs Nebulization Given 01/03/23 0800)  lactated ringers bolus 2,948 mL (2,948 mLs Intravenous New Bag/Given 01/03/23 1301)   IMPRESSION / MDM / ASSESSMENT AND PLAN / ED COURSE  I reviewed the triage vital signs and the nursing notes.                             The patient is on the cardiac monitor to evaluate for evidence of arrhythmia and/or significant heart rate changes. Patient's presentation is most consistent with acute presentation with potential threat to life or bodily function. Patient's presentation most consistent with  hyperglycemic state WITH evidence of DKA. Given Exam, History, and Workup I have low suspicion for an emergent precipitating factor of this hyperglycemic state such as atypical MI, acute abdomen, or other serious bacterial illness. Patient is type I diabetic with nonadherence in medication regimen/adherence. pH: 7.29 Potassium: 4.8 Patient seems to be in euglycemic DKA given increase in beta hydroxybutyrate, lactic acidosis, and anion gap of 21.  Patient does have normal pH however will continue insulin drip and D5 as well as admission for continued during Patient started on continuous fluid boluses of LR, insulin drip, potassium chloride supplemented as needed given potassium level at each BMP  Dispo: Admit    FINAL CLINICAL IMPRESSION(S) / ED DIAGNOSES   Final diagnoses:  Type 1 diabetes mellitus with ketoacidosis without coma (HCC)   Rx / DC Orders   ED Discharge Orders     None      Note:  This document was prepared using Dragon voice recognition software and may include unintentional dictation errors.   Merwyn Katos, MD 01/03/23 1501

## 2023-01-03 NOTE — ED Notes (Signed)
CCMD notified of need to monitor pt.

## 2023-01-03 NOTE — ED Notes (Signed)
Phlebotomy at bedside.

## 2023-01-03 NOTE — ED Notes (Signed)
Phlebotomy unsuccessful with lab draw at this time.

## 2023-01-03 NOTE — ED Triage Notes (Signed)
Pt here with SOB that started last night. Pt states he has been ill for the past few weeks with sneezing and coughing. Pt also states he has been having a fever with occasional cp. Pt stable in triage.

## 2023-01-03 NOTE — Assessment & Plan Note (Addendum)
Pt presenting w/ euglycemic DKA w/ noted sugars in 130s, bicarb 15, Urine + ketones Clinically dry  Noted vomiting and diarrhea x multiple days w/ dying battery on insulin pump  BHB 5.5  Will start on DKA protocol  Continue pending normalization of acidosis and anion gap Resume insulin pump as appropriate pending diabetic coordinator consult

## 2023-01-04 ENCOUNTER — Encounter: Payer: Self-pay | Admitting: Family Medicine

## 2023-01-04 DIAGNOSIS — E1010 Type 1 diabetes mellitus with ketoacidosis without coma: Secondary | ICD-10-CM

## 2023-01-04 LAB — GLUCOSE, CAPILLARY
Glucose-Capillary: 128 mg/dL — ABNORMAL HIGH (ref 70–99)
Glucose-Capillary: 140 mg/dL — ABNORMAL HIGH (ref 70–99)
Glucose-Capillary: 131 mg/dL — ABNORMAL HIGH (ref 70–99)
Glucose-Capillary: 145 mg/dL — ABNORMAL HIGH (ref 70–99)
Glucose-Capillary: 114 mg/dL — ABNORMAL HIGH (ref 70–99)
Glucose-Capillary: 138 mg/dL — ABNORMAL HIGH (ref 70–99)
Glucose-Capillary: 133 mg/dL — ABNORMAL HIGH (ref 70–99)
Glucose-Capillary: 163 mg/dL — ABNORMAL HIGH (ref 70–99)
Glucose-Capillary: 148 mg/dL — ABNORMAL HIGH (ref 70–99)
Glucose-Capillary: 181 mg/dL — ABNORMAL HIGH (ref 70–99)
Glucose-Capillary: 167 mg/dL — ABNORMAL HIGH (ref 70–99)
Glucose-Capillary: 157 mg/dL — ABNORMAL HIGH (ref 70–99)
Glucose-Capillary: 158 mg/dL — ABNORMAL HIGH (ref 70–99)
Glucose-Capillary: 192 mg/dL — ABNORMAL HIGH (ref 70–99)
Glucose-Capillary: 186 mg/dL — ABNORMAL HIGH (ref 70–99)
Glucose-Capillary: 172 mg/dL — ABNORMAL HIGH (ref 70–99)
Glucose-Capillary: 131 mg/dL — ABNORMAL HIGH (ref 70–99)
Glucose-Capillary: 121 mg/dL — ABNORMAL HIGH (ref 70–99)

## 2023-01-04 LAB — POTASSIUM: Potassium: 3.6 mmol/L (ref 3.5–5.1)

## 2023-01-04 LAB — BASIC METABOLIC PANEL
Anion gap: 12 (ref 5–15)
Anion gap: 11 (ref 5–15)
Anion gap: 13 (ref 5–15)
BUN: 12 mg/dL (ref 6–20)
BUN: 10 mg/dL (ref 6–20)
BUN: 8 mg/dL (ref 6–20)
CO2: 24 mmol/L (ref 22–32)
CO2: 24 mmol/L (ref 22–32)
CO2: 23 mmol/L (ref 22–32)
Calcium: 8.5 mg/dL — ABNORMAL LOW (ref 8.9–10.3)
Calcium: 8.2 mg/dL — ABNORMAL LOW (ref 8.9–10.3)
Calcium: 8.3 mg/dL — ABNORMAL LOW (ref 8.9–10.3)
Chloride: 96 mmol/L — ABNORMAL LOW (ref 98–111)
Chloride: 98 mmol/L (ref 98–111)
Chloride: 96 mmol/L — ABNORMAL LOW (ref 98–111)
Creatinine, Ser: 0.78 mg/dL (ref 0.61–1.24)
Creatinine, Ser: 0.84 mg/dL (ref 0.61–1.24)
Creatinine, Ser: 0.85 mg/dL (ref 0.61–1.24)
GFR, Estimated: >60 mL/min (ref >60)
GFR, Estimated: >60 mL/min (ref >60)
GFR, Estimated: >60 mL/min (ref >60)
Glucose, Bld: 127 mg/dL — ABNORMAL HIGH (ref 70–99)
Glucose, Bld: 135 mg/dL — ABNORMAL HIGH (ref 70–99)
Glucose, Bld: 193 mg/dL — ABNORMAL HIGH (ref 70–99)
Potassium: 3.7 mmol/L (ref 3.5–5.1)
Potassium: 4 mmol/L (ref 3.5–5.1)
Potassium: 3.9 mmol/L (ref 3.5–5.1)
Sodium: 132 mmol/L — ABNORMAL LOW (ref 135–145)
Sodium: 133 mmol/L — ABNORMAL LOW (ref 135–145)
Sodium: 132 mmol/L — ABNORMAL LOW (ref 135–145)

## 2023-01-04 LAB — MRSA NEXT GEN BY PCR, NASAL: MRSA by PCR Next Gen: NOT DETECTED

## 2023-01-04 LAB — BETA-HYDROXYBUTYRIC ACID
Beta-Hydroxybutyric Acid: 2.09 mmol/L — ABNORMAL HIGH (ref 0.05–0.27)
Beta-Hydroxybutyric Acid: 4.84 mmol/L — ABNORMAL HIGH (ref 0.05–0.27)

## 2023-01-04 LAB — MAGNESIUM: Magnesium: 1.7 mg/dL (ref 1.7–2.4)

## 2023-01-04 LAB — LACTIC ACID, PLASMA: Lactic Acid, Venous: 0.9 mmol/L (ref 0.5–1.9)

## 2023-01-04 LAB — CBG MONITORING, ED: Glucose-Capillary: 121 mg/dL — ABNORMAL HIGH (ref 70–99)

## 2023-01-04 MED ORDER — INSULIN PUMP
SUBCUTANEOUS | Status: DC
  Administered 2023-01-04: 0.7 via SUBCUTANEOUS
  Administered 2023-01-04: 1.25 via SUBCUTANEOUS
  Filled 2023-01-04: qty 1

## 2023-01-04 MED ORDER — CHLORHEXIDINE GLUCONATE CLOTH 2 % EX PADS: 6.0000 | MEDICATED_PAD | Freq: Every day | CUTANEOUS | Status: DC

## 2023-01-04 MED ORDER — AMLODIPINE BESYLATE 5 MG PO TABS
5.0000 mg | ORAL_TABLET | Freq: Every day | ORAL | Status: DC
  Administered 2023-01-05 – 2023-01-06 (×2): 5 mg via ORAL
  Filled 2023-01-04 (×2): qty 1

## 2023-01-04 MED ORDER — DEXTROSE IN LACTATED RINGERS 5 % IV SOLN: INTRAVENOUS | Status: DC

## 2023-01-04 MED ORDER — LOSARTAN POTASSIUM 50 MG PO TABS
50.0000 mg | ORAL_TABLET | Freq: Every day | ORAL | Status: DC
Start: 2023-01-05 — End: 2023-01-06
  Administered 2023-01-05 – 2023-01-06 (×2): 50 mg via ORAL
  Filled 2023-01-04 (×2): qty 1

## 2023-01-04 MED ORDER — INSULIN GLARGINE-YFGN 100 UNIT/ML ~~LOC~~ SOLN
17.0000 [IU] | Freq: Every day | SUBCUTANEOUS | Status: DC
  Filled 2023-01-04: qty 0.17

## 2023-01-04 MED ORDER — LOPERAMIDE HCL 2 MG PO CAPS
2.0000 mg | ORAL_CAPSULE | Freq: Once | ORAL | Status: AC
  Administered 2023-01-04: 2 mg via ORAL
  Filled 2023-01-04: qty 1

## 2023-01-04 MED ORDER — METOPROLOL SUCCINATE ER 50 MG PO TB24
100.0000 mg | ORAL_TABLET | Freq: Every day | ORAL | Status: DC
  Administered 2023-01-04 – 2023-01-06 (×3): 100 mg via ORAL
  Filled 2023-01-04 (×3): qty 2

## 2023-01-04 NOTE — Plan of Care (Signed)
  Problem: Education: Goal: Ability to describe self-care measures that may prevent or decrease complications (Diabetes Survival Skills Education) will improve Outcome: Progressing   Problem: Coping: Goal: Ability to adjust to condition or change in health will improve Outcome: Progressing   

## 2023-01-04 NOTE — Inpatient Diabetes Management (Signed)
Inpatient Diabetes Program Recommendations  AACE/ADA: New Consensus Statement on Inpatient Glycemic Control (2015)  Target Ranges:  Prepandial:   less than 140 mg/dL      Peak postprandial:   less than 180 mg/dL (1-2 hours)      Critically ill patients:  140 - 180 mg/dL   Lab Results  Component Value Date   GLUCAP 133 (H) 01/04/2023   HGBA1C 7.6 10/04/2022   Diabetes history: DM1(does not make insulin.  Needs correction, basal and meal coverage) diagnosed at age 47  Outpatient Diabetes medications: Omnipod insulin pump with Humalog and Dexcom G6 Basal--12a-5a--3.75 units/hr               5a-2p--1.05 units/hr               2p-12a--0.95 units/hr  Total daily basal is 22.7 units Carb ratio--1 unit for every 12 carbs Insulin sensitivity factor 1 unit drops him 50 mg/dL  Current orders for Inpatient glycemic control: IV insulin  Inpatient Diabetes Program Recommendations:    Spoke with patient and wife on the phone.  They read me the above pump settings.  He does not know why he went into euglycemic DKA.  He does not take any SGLT-2i's.  His pump reader battery died while in the ED.  He is currently charging it.  He does not have another pump site with him.  His wife can go home and get one today.    My recommendation would be to transition to his new pump site allow IV insulin to run for 1 hours after pump is started.    If MD would like to transition to SQ insulin, please consider:  Semglee 17 units-allow IV insulin to run for 2 hours after administration of basal insulin. Novolog 5 units with meals if he eats at least 50% Novolog 0-9 units TID and 0-5 at bedtime  He is current with [facility].  Last visit was 11/17/22.  Will continue to follow while inpatient.  Thank you, Dulce Sellar, MSN, CDCES Diabetes Coordinator Inpatient Diabetes Program [phone] (team pager from 8a-5p)

## 2023-01-04 NOTE — Plan of Care (Signed)
  Problem: Education: Goal: Ability to describe self-care measures that may prevent or decrease complications (Diabetes Survival Skills Education) will improve 01/04/2023 0500 by Domenic Schwab, RN Outcome: Progressing 01/04/2023 0457 by Domenic Schwab, RN Outcome: Progressing Goal: Individualized Educational Video(s) Outcome: Progressing   Problem: Coping: Goal: Ability to adjust to condition or change in health will improve Outcome: Progressing   Problem: Fluid Volume: Goal: Ability to maintain a balanced intake and output will improve 01/04/2023 0500 by Domenic Schwab, RN Outcome: Progressing 01/04/2023 0457 by Domenic Schwab, RN Outcome: Progressing   Problem: Health Behavior/Discharge Planning: Goal: Ability to identify and utilize available resources and services will improve 01/04/2023 0500 by Domenic Schwab, RN Outcome: Progressing 01/04/2023 0457 by Domenic Schwab, RN Outcome: Progressing Goal: Ability to manage health-related needs will improve 01/04/2023 0500 by Domenic Schwab, RN Outcome: Progressing 01/04/2023 0457 by Domenic Schwab, RN Outcome: Progressing   Problem: Fluid Volume: Goal: Ability to maintain a balanced intake and output will improve 01/04/2023 0500 by Domenic Schwab, RN Outcome: Progressing 01/04/2023 0457 by Domenic Schwab, RN Outcome: Progressing   Problem: Coping: Goal: Ability to adjust to condition or change in health will improve Outcome: Progressing   Problem: Education: Goal: Individualized Educational Video(s) Outcome: Progressing   Problem: Education: Goal: Ability to describe self-care measures that may prevent or decrease complications (Diabetes Survival Skills Education) will improve 01/04/2023 0500 by Domenic Schwab, RN Outcome: Progressing 01/04/2023 0457 by Domenic Schwab, RN Outcome: Progressing   Problem: Health Behavior/Discharge Planning: Goal: Ability to identify and utilize  available resources and services will improve 01/04/2023 0500 by Domenic Schwab, RN Outcome: Progressing 01/04/2023 0457 by Domenic Schwab, RN Outcome: Progressing   Problem: Health Behavior/Discharge Planning: Goal: Ability to manage health-related needs will improve 01/04/2023 0500 by Domenic Schwab, RN Outcome: Progressing 01/04/2023 0457 by Domenic Schwab, RN Outcome: Progressing

## 2023-01-04 NOTE — Progress Notes (Addendum)
  PROGRESS NOTE    Michael Hutchinson  UJW:[id number] DOB: [birth year] DOA: 01/03/2023 PCP: Michael Quitter, PA  [id number]  LOS: 1 day   Brief hospital course:   Assessment & Plan: Michael Hutchinson is a 47 y.o. male with medical history significant of obesity, HTN, type 1 diabetes presenting w/ euglycemic DKA.  Patient reports recurrent nausea vomiting diarrhea over the past 3 to 4 days.     * Euclycemic DKA (diabetic ketoacidosis) (HCC) DM1 Pt presenting w/ euglycemic DKA w/ noted sugars in 130s, gap 21, bicarb 15, Urine + ketones, BHB 5.51. --pt reported insulin pump was working PTA and BG have been 130's at home. --started on insulin gtt per DKA protocol on presentation.  This morning, gap closed, bicarb normalized, however, Beta-Hydroxybutyric Acid remained elevated at 2.09.  Per diabetic coordinator, ok to transition off insulin gtt to pt's home insulin pump.  Insulin pump applied around 1 pm.  Lab recheck at 4 pm found Beta-Hydroxybutyric Acid increased back up to 4.84.   Plan: --need to go back on insulin gtt with D5 infusion until Beta-Hydroxybutyric Acid close to normal --can keep diabetic diet --will recheck labs tomorrow morning.  Multiple sclerosis (HCC) Stable  Monitor   Hypertension associated with diabetes (HCC) --resume home amlodipine, Toprol and losartan  GERD (gastroesophageal reflux disease) --not taking PPI PTA  Severe obesity, BMI 44   DVT prophylaxis: Lovenox SQ Code Status: Full code  Family Communication:  Level of care: Stepdown Dispo:   The patient is from: home Anticipated d/c is to: home Anticipated d/c date is: 1-2 days   Subjective and Interval History:  Pt reported feeling much better this morning, no nausea, just a little weak.    Gap closed, bicarb normalized, however, Beta-Hydroxybutyric Acid remained elevated at 2.09.  Per diabetic coordinator, ok to transition off insulin gtt to pt's home insulin pump.  Lab recheck at 4 pm found  Beta-Hydroxybutyric Acid increased back up to 4.84.     Objective: Vitals:   01/04/23 1500 01/04/23 1600 01/04/23 1700 01/04/23 1800  BP:  (!) 154/90    Pulse: (!) 111 (!) 111 (!) 104 (!) 111  Resp: 17 (!) 21 19 16   Temp:  99.1 F (37.3 C)    TempSrc:  Oral    SpO2: 99% 96% 95% 97%  Weight:      Height:        Intake/Output Summary (Last 24 hours) at 01/04/2023 1856 Last data filed at 01/04/2023 1800 Gross per 24 hour  Intake 1732.73 ml  Output 1750 ml  Net -17.27 ml   Filed Weights   01/03/23 0710  Weight: (!) 147.4 kg    Examination:   Constitutional: NAD, AAOx3 HEENT: conjunctivae and lids normal, EOMI CV: No cyanosis.   RESP: normal respiratory effort, on RA Neuro: II - XII grossly intact.   Psych: Normal mood and affect.  Appropriate judgement and reason   Data Reviewed: I have personally reviewed labs and imaging studies  Time spent: 50 minutes  Michael Priestly, MD [facility] If 7PM-7AM, please contact night-coverage 01/04/2023, 6:56 PM

## 2023-01-05 DIAGNOSIS — E1010 Type 1 diabetes mellitus with ketoacidosis without coma: Secondary | ICD-10-CM | POA: Diagnosis not present

## 2023-01-05 LAB — BASIC METABOLIC PANEL
Anion gap: 9 (ref 5–15)
BUN: 6 mg/dL (ref 6–20)
CO2: 27 mmol/L (ref 22–32)
Calcium: 8.5 mg/dL — ABNORMAL LOW (ref 8.9–10.3)
Chloride: 96 mmol/L — ABNORMAL LOW (ref 98–111)
Creatinine, Ser: 0.76 mg/dL (ref 0.61–1.24)
GFR, Estimated: >60 mL/min (ref >60)
Glucose, Bld: 107 mg/dL — ABNORMAL HIGH (ref 70–99)
Potassium: 3.5 mmol/L (ref 3.5–5.1)
Sodium: 132 mmol/L — ABNORMAL LOW (ref 135–145)

## 2023-01-05 LAB — GLUCOSE, CAPILLARY
Glucose-Capillary: 114 mg/dL — ABNORMAL HIGH (ref 70–99)
Glucose-Capillary: 117 mg/dL — ABNORMAL HIGH (ref 70–99)
Glucose-Capillary: 129 mg/dL — ABNORMAL HIGH (ref 70–99)
Glucose-Capillary: 133 mg/dL — ABNORMAL HIGH (ref 70–99)
Glucose-Capillary: 136 mg/dL — ABNORMAL HIGH (ref 70–99)
Glucose-Capillary: 136 mg/dL — ABNORMAL HIGH (ref 70–99)
Glucose-Capillary: 138 mg/dL — ABNORMAL HIGH (ref 70–99)
Glucose-Capillary: 143 mg/dL — ABNORMAL HIGH (ref 70–99)
Glucose-Capillary: 156 mg/dL — ABNORMAL HIGH (ref 70–99)
Glucose-Capillary: 158 mg/dL — ABNORMAL HIGH (ref 70–99)
Glucose-Capillary: 160 mg/dL — ABNORMAL HIGH (ref 70–99)
Glucose-Capillary: 168 mg/dL — ABNORMAL HIGH (ref 70–99)
Glucose-Capillary: 169 mg/dL — ABNORMAL HIGH (ref 70–99)
Glucose-Capillary: 184 mg/dL — ABNORMAL HIGH (ref 70–99)
Glucose-Capillary: 187 mg/dL — ABNORMAL HIGH (ref 70–99)
Glucose-Capillary: 195 mg/dL — ABNORMAL HIGH (ref 70–99)
Glucose-Capillary: 196 mg/dL — ABNORMAL HIGH (ref 70–99)
Glucose-Capillary: 206 mg/dL — ABNORMAL HIGH (ref 70–99)
Glucose-Capillary: 206 mg/dL — ABNORMAL HIGH (ref 70–99)
Glucose-Capillary: 219 mg/dL — ABNORMAL HIGH (ref 70–99)
Glucose-Capillary: 237 mg/dL — ABNORMAL HIGH (ref 70–99)
Glucose-Capillary: 249 mg/dL — ABNORMAL HIGH (ref 70–99)

## 2023-01-05 LAB — CBC
HCT: 36.7 % — ABNORMAL LOW (ref 39.0–52.0)
Hemoglobin: 12.4 g/dL — ABNORMAL LOW (ref 13.0–17.0)
MCH: 32 pg (ref 26.0–34.0)
MCHC: 33.8 g/dL (ref 30.0–36.0)
MCV: 94.8 fL (ref 80.0–100.0)
Platelets: 170 K/uL (ref 150–400)
RBC: 3.87 MIL/uL — ABNORMAL LOW (ref 4.22–5.81)
RDW: 13.3 % (ref 11.5–15.5)
WBC: 5.3 K/uL (ref 4.0–10.5)
nRBC: 0 % (ref 0.0–0.2)

## 2023-01-05 LAB — BETA-HYDROXYBUTYRIC ACID
Beta-Hydroxybutyric Acid: 1.21 mmol/L — ABNORMAL HIGH (ref 0.05–0.27)
Beta-Hydroxybutyric Acid: 1.28 mmol/L — ABNORMAL HIGH (ref 0.05–0.27)
Beta-Hydroxybutyric Acid: 1.08 mmol/L — ABNORMAL HIGH (ref 0.05–0.27)

## 2023-01-05 MED ORDER — KCL-LACTATED RINGERS-D5W 20 MEQ/L IV SOLN
INTRAVENOUS | Status: AC
Start: 2023-01-05 — End: 2023-01-06
  Filled 2023-01-05: qty 1000

## 2023-01-05 MED ORDER — ORAL CARE MOUTH RINSE: 15.0000 mL | OROMUCOSAL | Status: DC | PRN

## 2023-01-05 NOTE — Plan of Care (Signed)
  Problem: Education: Goal: Ability to describe self-care measures that may prevent or decrease complications (Diabetes Survival Skills Education) will improve Outcome: Progressing   Problem: Coping: Goal: Ability to adjust to condition or change in health will improve Outcome: Progressing   Problem: Fluid Volume: Goal: Ability to maintain a balanced intake and output will improve Outcome: Progressing   Problem: Health Behavior/Discharge Planning: Goal: Ability to identify and utilize available resources and services will improve Outcome: Progressing Goal: Ability to manage health-related needs will improve Outcome: Progressing   Problem: Metabolic: Goal: Ability to maintain appropriate glucose levels will improve Outcome: Progressing   Problem: Nutritional: Goal: Maintenance of adequate nutrition will improve Outcome: Progressing Goal: Progress toward achieving an optimal weight will improve Outcome: Progressing   Problem: Education: Goal: Ability to describe self-care measures that may prevent or decrease complications (Diabetes Survival Skills Education) will improve Outcome: Progressing   Problem: Health Behavior/Discharge Planning: Goal: Ability to identify and utilize available resources and services will improve Outcome: Progressing Goal: Ability to manage health-related needs will improve Outcome: Progressing   Problem: Fluid Volume: Goal: Ability to achieve a balanced intake and output will improve Outcome: Progressing   Problem: Metabolic: Goal: Ability to maintain appropriate glucose levels will improve Outcome: Progressing   Problem: Nutritional: Goal: Maintenance of adequate nutrition will improve Outcome: Progressing Goal: Maintenance of adequate weight for body size and type will improve Outcome: Progressing   Problem: Education: Goal: Knowledge of General Education information will improve Description: Including pain rating scale,  medication(s)/side effects and non-pharmacologic comfort measures Outcome: Progressing   Problem: Health Behavior/Discharge Planning: Goal: Ability to manage health-related needs will improve Outcome: Progressing   Problem: Clinical Measurements: Goal: Ability to maintain clinical measurements within normal limits will improve Outcome: Progressing Goal: Diagnostic test results will improve Outcome: Progressing   Problem: Nutrition: Goal: Adequate nutrition will be maintained Outcome: Progressing   Problem: Coping: Goal: Level of anxiety will decrease Outcome: Progressing   Problem: Pain Management: Goal: General experience of comfort will improve Outcome: Progressing

## 2023-01-05 NOTE — Plan of Care (Signed)

## 2023-01-05 NOTE — Inpatient Diabetes Management (Addendum)
Inpatient Diabetes Program Recommendations  AACE/ADA: New Consensus Statement on Inpatient Glycemic Control  Target Ranges:  Prepandial:   less than 140 mg/dL      Peak postprandial:   less than 180 mg/dL (1-2 hours)      Critically ill patients:  140 - 180 mg/dL    Latest Reference Range & Units 01/04/23 23:16 01/05/23 00:14 01/05/23 01:12 01/05/23 02:00 01/05/23 03:02 01/05/23 04:06 01/05/23 05:06 01/05/23 06:14 01/05/23 07:17  Glucose-Capillary 70 - 99 mg/dL 401 (H) 027 (H) 253 (H) 117 (H) 114 (H) 136 (H) 133 (H) 169 (H) 156 (H)    Latest Reference Range & Units 01/03/23 10:21 01/03/23 15:12 01/03/23 22:30 01/04/23 06:59 01/04/23 16:01 01/05/23 03:03  Beta-Hydroxybutyric Acid 0.05 - 0.27 mmol/L 5.51 (H) 4.22 (H) 2.45 (H) 2.09 (H) 4.84 (H) 1.21 (H)    Latest Reference Range & Units 01/03/23 07:26  CO2 22 - 32 mmol/L 15 (L)  Glucose 70 - 99 mg/dL 664 (H)  Anion gap 5 - 15  21 (H)   Review of Glycemic Control  Diabetes history: DM1 Outpatient Diabetes medications: OmniPod Insulin Pump with Humalog; Dexcom G6 CGM); Basal 12A 0.75 units/hr, 5A 1.05 units/hr, 2P 0.95 units/hour; Total Basal: 22.7 units/24h, I:Carb Ratio 1:12 grams, I: Sensitivity Factor 1:50 mg/dl Current orders for Inpatient glycemic control: IV insulin  Inpatient Diabetes Program Recommendations:    Insulin: Once provider is ready to transition off IV Insulin, please consider ordering Semglee 20 units Q24H, CBGs Q4H, Novolog 0-9 units Q4H, and Novolog 4 units TID with meals for meal coverage if patient eats at least 50% of meals.  Outpatient DM: Patient agreeable to use SQ insulin regimen with plans to resume his insulin pump outpatient once he feels better and eating better. Recommend to discharge on Lantus 20 units daily, Novolog 1 unit per 12 grams of carbs, and Novolog 1 unit per every 50 mg/dl above target glucose. Please provide Rx for urine ketone strips (#[id number]).   NOTE: Patient admitted on 01/03/23 with  euglycemic DKA; per H&P patient vomiting and diarrhea multiple days with dying battery on insulin pump. Patient initially ordered IV insulin and was transitioned off IV insulin to insulin pump on 12/25 around 1pm;  lab recheck at 4 pm found Beta-Hydroxybutyric Acid increased back up to 4.84 so patient was restarted on IV insulin. Question if ketosis more related to starvation ketosis than DKA given patient had vomiting and diarrhea multiple days prior to admission. Will plan to speak with patient today to inquire about "dying battery on insulin pump." Addendum 01/05/23@10 :25-Spoke to patient and wife at bedside. Patient reports that after he transitioned to his insulin pump yesterday, he didn't really eat any carbohydrates.  Patient reports he had put on a new OmniPod insulin pump pod which he filled with Humalog insulin. Patient states that his OmniPod was working fine. Inquired about the "dying battery on insulin pump" noted in H&P and patient stated that his PDM (brain of the pump - handheld device that he uses to put in carbs and glucose to bolus insulin) stopped working because it needed to be charged.  Patient states he has OmniPod pods, Dexcom G6 sensors, Humalog insulin, and Lantus insulin at home (uses Lantus when not using insulin pump). Patient reports that he has not really ate or drank much of any carbs over the 1-2 weeks prior to admission. Discussed importance of getting enough carbs/glucose to prevent ketosis process. Discussed DKA versus starvation ketosis. Encouraged patient to check ketones anytime he  is not feeling well and suspects ketoacidosis.  Will ask provider to prescribe urine ketone strips at discharge.  Patient reports that he wants to be discharged today and is agreeable to use SQ insulin and go home on SQ regimen with plans to resume his pump when he is feeling much better and eating better. Discussed waiting 24 hours after last dose of basal insulin before restarting insulin pump.   Asked patient to try to eat/drink carbohydrates in moderation and consistently.  Will recommend to discharge him on Lantus 20 units daily, Novolog 1 unit per 12 grams of carbs and Novolog 1 unit per 50 mg/dl above target glucose (he is aware of recommendations being made). Encouraged patient to call Endocrinologist to get follow up appointment regarding DM management. Patient verbalized understanding of information and has no questions or concerns at this time.  Thanks, Orlando Penner, RN, MSN, CDCES Diabetes Coordinator Inpatient Diabetes Program [phone] (Team Pager from 8am to 5pm)

## 2023-01-05 NOTE — TOC CM/SW Note (Signed)
Transition of Care [facility]) - Inpatient Brief Assessment   Patient Details  Name: Michael Hutchinson MRN: [id number] Date of Birth: 1975-08-24  Transition of Care [facility]) CM/SW Contact:    Margarito Liner, LCSW Phone Number: 01/05/2023, 11:27 AM   Clinical Narrative: CSW reviewed chart. No TOC needs identified at this time. CSW will continue to follow progress. Please place [facility] consult if any needs arise.  Transition of Care Asessment: Insurance and Status: Insurance coverage has been reviewed Patient has primary care physician: Yes Home environment has been reviewed: Single family home Prior level of function:: Not documented Prior/Current Home Services: No current home services Social Drivers of Health Review: SDOH reviewed no interventions necessary Readmission risk has been reviewed: Yes Transition of care needs: no transition of care needs at this time

## 2023-01-05 NOTE — Progress Notes (Signed)
  PROGRESS NOTE    Michael Hutchinson  ZOX:[id number] DOB: 03/08/1975 DOA: 01/03/2023 PCP: Melida Quitter, PA  [id number]  LOS: 3 days   Brief hospital course:   Assessment & Plan: Michael Hutchinson is a 47 y.o. male with medical history significant of obesity, HTN, type 1 diabetes presenting w/ euglycemic DKA.  Patient reports recurrent nausea vomiting diarrhea over the past 3 to 4 days.     * Euclycemic DKA (diabetic ketoacidosis) (HCC) DM1 Pt presenting w/ euglycemic DKA w/ noted sugars in 130s, gap 21, bicarb 15, Urine + ketones, BHB 5.51. --pt reported insulin pump was working PTA and BG have been 130's at home. --started on insulin gtt per DKA protocol on presentation.  This morning, gap closed, bicarb normalized, however, Beta-Hydroxybutyric Acid remained elevated at 2.09.  Per diabetic coordinator, ok to transition off insulin gtt to pt's home insulin pump.  Insulin pump applied around 1 pm.  Lab recheck at 4 pm found Beta-Hydroxybutyric Acid increased back up to 4.84.   Plan: --cont insulin gtt until BHB acid 0.5 or less --can keep diabetic diet  Multiple sclerosis (HCC) Stable  Monitor   Hypertension associated with diabetes (HCC) --cont home amlodipine, Toprol and losartan  GERD (gastroesophageal reflux disease) --not taking PPI PTA  Severe obesity, BMI 44   DVT prophylaxis: Lovenox SQ Code Status: Full code  Family Communication:  Level of care: Stepdown Dispo:   The patient is from: home Anticipated d/c is to: home Anticipated d/c date is: tomorrow   Subjective and Interval History:  Pt felt well and wanted to go home, however, BHB acid remained elevated, so pt was kept on insulin gtt.   Objective: Vitals:   01/06/23 0400 01/06/23 0733 01/06/23 0800 01/06/23 0956  BP:   (!) 135/93 (!) 135/93  Pulse:   96 96  Resp:   18   Temp: 99.1 F (37.3 C) 98.8 F (37.1 C)    TempSrc: Oral Oral    SpO2:   96%   Weight:      Height:       No intake  or output data in the 24 hours ending 01/07/23 1946  Filed Weights   01/03/23 0710  Weight: (!) 147.4 kg    Examination:   Constitutional: NAD, AAOx3 HEENT: conjunctivae and lids normal, EOMI CV: No cyanosis.   RESP: normal respiratory effort, on RA Neuro: II - XII grossly intact.   Psych: grouchy mood and affect.     Data Reviewed: I have personally reviewed labs and imaging studies  Time spent: 35 minutes  Darlin Priestly, MD [facility] If 7PM-7AM, please contact night-coverage 01/07/2023, 7:46 PM

## 2023-01-05 NOTE — Discharge Instructions (Addendum)
No answer when called [facility] End[facility]ology so not able to make follow up appointment. Therefore, please call [facility] End[facility] and ask for follow up appointment and also ask to speak with the diabetes educator at the office about getting in contact with OmniPod representative to get an online OmniPod account created so Endocrinology will be able to view pump and Dexcom information.

## 2023-01-06 DIAGNOSIS — E1010 Type 1 diabetes mellitus with ketoacidosis without coma: Secondary | ICD-10-CM | POA: Diagnosis not present

## 2023-01-06 LAB — BASIC METABOLIC PANEL
Anion gap: 13 (ref 5–15)
BUN: 7 mg/dL (ref 6–20)
CO2: 29 mmol/L (ref 22–32)
Calcium: 8.9 mg/dL (ref 8.9–10.3)
Chloride: 94 mmol/L — ABNORMAL LOW (ref 98–111)
Creatinine, Ser: 0.93 mg/dL (ref 0.61–1.24)
GFR, Estimated: >60 mL/min (ref >60)
Glucose, Bld: 135 mg/dL — ABNORMAL HIGH (ref 70–99)
Potassium: 3.8 mmol/L (ref 3.5–5.1)
Sodium: 136 mmol/L (ref 135–145)

## 2023-01-06 LAB — CBC
HCT: 38.6 % — ABNORMAL LOW (ref 39.0–52.0)
Hemoglobin: 12.8 g/dL — ABNORMAL LOW (ref 13.0–17.0)
MCH: 32.3 pg (ref 26.0–34.0)
MCHC: 33.2 g/dL (ref 30.0–36.0)
MCV: 97.5 fL (ref 80.0–100.0)
Platelets: 169 10*3/uL (ref 150–400)
RBC: 3.96 MIL/uL — ABNORMAL LOW (ref 4.22–5.81)
RDW: 13.4 % (ref 11.5–15.5)
WBC: 4.6 10*3/uL (ref 4.0–10.5)
nRBC: 0 % (ref 0.0–0.2)

## 2023-01-06 LAB — GLUCOSE, CAPILLARY
Glucose-Capillary: 126 mg/dL — ABNORMAL HIGH (ref 70–99)
Glucose-Capillary: 127 mg/dL — ABNORMAL HIGH (ref 70–99)
Glucose-Capillary: 132 mg/dL — ABNORMAL HIGH (ref 70–99)
Glucose-Capillary: 132 mg/dL — ABNORMAL HIGH (ref 70–99)
Glucose-Capillary: 145 mg/dL — ABNORMAL HIGH (ref 70–99)
Glucose-Capillary: 145 mg/dL — ABNORMAL HIGH (ref 70–99)
Glucose-Capillary: 148 mg/dL — ABNORMAL HIGH (ref 70–99)
Glucose-Capillary: 168 mg/dL — ABNORMAL HIGH (ref 70–99)
Glucose-Capillary: 177 mg/dL — ABNORMAL HIGH (ref 70–99)

## 2023-01-06 LAB — BETA-HYDROXYBUTYRIC ACID: Beta-Hydroxybutyric Acid: 0.55 mmol/L — ABNORMAL HIGH (ref 0.05–0.27)

## 2023-01-06 LAB — MAGNESIUM: Magnesium: 1.7 mg/dL (ref 1.7–2.4)

## 2023-01-06 MED ORDER — HUMALOG KWIKPEN 200 UNIT/ML ~~LOC~~ SOPN: PEN_INJECTOR | SUBCUTANEOUS | Status: AC

## 2023-01-06 MED ORDER — POTASSIUM CHLORIDE CRYS ER 20 MEQ PO TBCR
20.0000 meq | EXTENDED_RELEASE_TABLET | Freq: Once | ORAL | Status: AC
  Administered 2023-01-06: 20 meq via ORAL
  Filled 2023-01-06: qty 1

## 2023-01-06 MED ORDER — LANTUS SOLOSTAR 100 UNIT/ML ~~LOC~~ SOPN: 20.0000 [IU] | PEN_INJECTOR | Freq: Every day | SUBCUTANEOUS | 2 refills | Status: DC

## 2023-01-06 MED ORDER — CETIRIZINE HCL 10 MG PO TABS: 10.0000 mg | ORAL_TABLET | Freq: Every day | ORAL | Status: DC | PRN

## 2023-01-06 MED ORDER — POTASSIUM CHLORIDE CRYS ER 20 MEQ PO TBCR: 40.0000 meq | EXTENDED_RELEASE_TABLET | Freq: Once | ORAL | Status: DC

## 2023-01-06 MED ORDER — INSULIN GLARGINE-YFGN 100 UNIT/ML ~~LOC~~ SOLN
20.0000 [IU] | Freq: Once | SUBCUTANEOUS | Status: AC
  Administered 2023-01-06: 20 [IU] via SUBCUTANEOUS
  Filled 2023-01-06: qty 0.2

## 2023-01-06 MED ORDER — ACETONE (URINE) TEST VI STRP: 1.0000 | ORAL_STRIP | 0 refills | Status: AC | PRN

## 2023-01-06 MED ORDER — MAGNESIUM SULFATE 2 GM/50ML IV SOLN
2.0000 g | Freq: Once | INTRAVENOUS | Status: AC
  Administered 2023-01-06: 2 g via INTRAVENOUS
  Filled 2023-01-06: qty 50

## 2023-01-06 MED ORDER — FLUTICASONE PROPIONATE 50 MCG/ACT NA SUSP
2.0000 | Freq: Every day | NASAL | Status: DC | PRN
Start: 2023-01-06 — End: 2023-07-12

## 2023-01-06 NOTE — Progress Notes (Signed)
Patient discharged to home with wife, transported in wheelchair. AxOx4, VSS. Patient discharged with all belongings. Discharge education provided to patient on follow-up appointments and home medication regimen.

## 2023-01-06 NOTE — Inpatient Diabetes Management (Signed)
Inpatient Diabetes Program Recommendations  AACE/ADA: New Consensus Statement on Inpatient Glycemic Control   Target Ranges:  Prepandial:   less than 140 mg/dL      Peak postprandial:   less than 180 mg/dL (1-2 hours)      Critically ill patients:  140 - 180 mg/dL    Latest Reference Range & Units 01/06/23 00:02 01/06/23 01:08 01/06/23 02:02 01/06/23 03:03 01/06/23 04:15 01/06/23 05:13 01/06/23 06:04  Glucose-Capillary 70 - 99 mg/dL 161 (H) 096 (H) 045 (H) 177 (H) 145 (H) 127 (H) 132 (H)    Latest Reference Range & Units 01/03/23 10:21 01/03/23 15:12 01/03/23 22:30 01/04/23 06:59 01/04/23 16:01 01/05/23 03:03 01/05/23 10:29 01/05/23 16:58 01/06/23 04:31  Beta-Hydroxybutyric Acid 0.05 - 0.27 mmol/L 5.51 (H) 4.22 (H) 2.45 (H) 2.09 (H) 4.84 (H) 1.21 (H) 1.28 (H) 1.08 (H) 0.55 (H)   Review of Glycemic Control  Diabetes history: DM1 Outpatient Diabetes medications: OmniPod Insulin Pump with Humalog; Dexcom G6 CGM); Basal 12A 0.75 units/hr, 5A 1.05 units/hr, 2P 0.95 units/hour; Total Basal: 22.7 units/24h, I:Carb Ratio 1:12 grams, I: Sensitivity Factor 1:50 mg/dl Current orders for Inpatient glycemic control: IV insulin   Inpatient Diabetes Program Recommendations:     Insulin: Once provider is ready to transition off IV Insulin, please consider ordering Semglee 20 units Q24H, CBGs Q4H, Novolog 0-9 units Q4H, and Novolog 4 units TID with meals for meal coverage if patient eats at least 50% of meals.   Outpatient DM: Patient agreeable to use SQ insulin regimen with plans to resume his insulin pump outpatient once he feels better and eating better. Recommend to discharge on Lantus 20 units daily, Novolog 1 unit per 12 grams of carbs, and Novolog 1 unit per every 50 mg/dl above target glucose. Please provide Rx for urine ketone strips (#[id number]).    Thanks, Orlando Penner, RN, MSN, CDCES Diabetes Coordinator Inpatient Diabetes Program [phone] (Team Pager from 8am to 5pm)

## 2023-01-06 NOTE — Discharge Summary (Signed)
Physician Discharge Summary   Michael Hutchinson  male DOB: 1975/07/22  ZHY:[id number]  PCP: Melida Quitter, PA  Admit date: 01/03/2023 Discharge date: 01/06/2023  Admitted From: home Disposition:  home CODE STATUS: Full code  Discharge Instructions     Diet Carb Modified   Complete by: As directed    Discharge instructions   Complete by: As directed    While off insulin pump, please give yourself Lantus 20 units daily, Novolog 1 unit per 12 grams of carbs, and Novolog 1 unit per every 50 mg/dl above target glucose.  You can transition back to insulin pump when you feel back to normal and are eating well.  Place the pump 24 hours after Lantus injection. [facility] Course:  For full details, please see H&P, progress notes, consult notes and ancillary notes.  Briefly,  Michael Hutchinson is a 47 y.o. male with medical history significant of obesity, HTN, type 1 diabetes presenting recurrent nausea vomiting diarrhea over the past 3 to 4 days.     * Euclycemic DKA and likely starvation ketosis  DM1 --on presentation, noted sugars in 130s, gap 21, bicarb 15, Urine + ketones, BHB 5.51. --pt reported insulin pump was working PTA and BG have been 130's at home.  Pt has not had much oral intake for about a week, possibly causing starvation ketosis. --started on insulin gtt per DKA protocol on presentation.  Gap and bicarb normalized the next day, however, BHB acid remained persistently elevated.  Pt received insulin gtt until BHB acid dropped down to 0.55 before transitioning to subQ insulin.   --pt was discharged on Lantus 20 units daily, Novolog 1 unit per 12 grams of carbs, and Novolog 1 unit per every 50 mg/dl above target glucose.  Pt was advised to transition back to his insulin pump after he feels back to baseline and is eating normally.   Multiple sclerosis (HCC) Stable    Hypertension associated with diabetes (HCC) --cont home amlodipine, Toprol and losartan   GERD  (gastroesophageal reflux disease) --not taking PPI PTA   Severe obesity, BMI 44   Unless noted above, medications under "STOP" list are ones pt was not taking PTA.  Discharge Diagnoses:  Principal Problem:   DKA (diabetic ketoacidosis) (HCC) Active Problems:   GERD (gastroesophageal reflux disease)   Hypertension associated with diabetes (HCC)   Multiple sclerosis (HCC)   30 Day Unplanned Readmission Risk Score    Flowsheet Row ED to Hosp-Admission (Current) from 01/03/2023 in [facility] ICU/CCU  30 Day Unplanned Readmission Risk Score (%) 13 Filed at 01/06/2023 0801       This score is the patient's risk of an unplanned readmission within 30 days of being discharged (0 -100%). The score is based on dignosis, age, lab data, medications, orders, and past utilization.   Low:  0-14.9   Medium: 15-21.9   High: 22-29.9   Extreme: 30 and above         Discharge Instructions:  Allergies as of 01/06/2023       Reactions   Influenza Vac Split Quad Nausea And Vomiting   Influenza Virus Vaccine Split Nausea And Vomiting   Statins Other (See Comments)   Myalgias   Hydrochlorothiazide Other (See Comments)   Dizziness   Lisinopril Cough        Medication List     STOP taking these medications    Insulin Syringes (Disposable) U-100 1 ML Misc  omeprazole 20 MG tablet Commonly known as: PRILOSEC OTC       TAKE these medications    acetone (urine) test strip 1 strip by Does not apply route as needed for high blood sugar.   albuterol 108 (90 Base) MCG/ACT inhaler Commonly known as: VENTOLIN HFA Inhale 2 puffs into the lungs every 6 (six) hours as needed for wheezing or shortness of breath.   amLODipine 5 MG tablet Commonly known as: NORVASC Take 1 tablet (5 mg total) by mouth daily.   Blood Pressure Monitor/L Cuff Misc Use blood pressure cuff and monitor to check blood pressure at home at least several times per week if not daily.    cetirizine 10 MG tablet Commonly known as: ZyrTEC Allergy Take 1 tablet (10 mg total) by mouth daily as needed for allergies. Home med. What changed:  when to take this reasons to take this additional instructions   Dexcom G6 Receiver Hardie Pulley See admin instructions.   Dexcom G6 Sensor Misc SMARTSIG:1 Topical Every 10 Days   Dexcom G6 Transmitter Misc DISPENSE AND USE AS DIRECTED   ezetimibe 10 MG tablet Commonly known as: ZETIA Take 1 tablet (10 mg total) by mouth daily.   fluticasone 50 MCG/ACT nasal spray Commonly known as: FLONASE Place 2 sprays into both nostrils daily as needed for allergies or rhinitis. What changed:  when to take this reasons to take this   glucose blood test strip Commonly known as: Bayer Contour Next Test 1 each by Other route 4 (four) times daily. And lancets 4/day   [name] 200 UNIT/ML KwikPen Generic drug: insulin lispro 1 unit per 12 grams of carbs, and Novolog 1 unit per every 50 mg/dl above target glucose.  While off insulin pump. What changed:  how much to take how to take this when to take this additional instructions   HYDROcodone-acetaminophen 5-325 MG tablet Commonly known as: NORCO/VICODIN Take 1 tablet by mouth every 4 (four) hours as needed.   Lantus SoloStar 100 UNIT/ML Solostar Pen Generic drug: insulin glargine Inject 20 Units into the skin daily. While off insulin pump.   losartan 50 MG tablet Commonly known as: COZAAR TAKE 1 TABLET BY MOUTH ONCE DAILY   metoprolol succinate 100 MG 24 hr tablet Commonly known as: TOPROL-XL Take 1 tablet (100 mg total) by mouth daily. TAKE WITH OR IMMEDIATELY FOLLOWING A MEAL.   multivitamin with minerals tablet Take 1 tablet by mouth daily.   Ocrevus 300 MG/10ML injection Generic drug: ocrelizumab Inject 300 mg into the vein every 6 (six) months.   Omnipod 5 DexG7G6 Pods Gen 5 Misc Inject 1 Device into the skin daily.   ondansetron 4 MG disintegrating  tablet Commonly known as: ZOFRAN-ODT Take 1 tablet (4 mg total) by mouth every 8 (eight) hours as needed for nausea or vomiting.   Pen Needles 32G X 4 MM Misc USE 5 TIMES DAILY AS DIRECTED   rizatriptan 10 MG disintegrating tablet Commonly known as: Maxalt-MLT Take 1 tablet earliest onset of headache.  May repeat after 2 hours if needed. Maximum 2 tablets in 24 hours         Follow-up Information     Simon Rhein, MD Follow up in 1 week(s).   Specialty: Endocrinology Contact information: [address] [place] [address] [phone]                 Allergies  Allergen Reactions   Influenza Vac Split Quad Nausea And Vomiting   Influenza  Virus Vaccine Split Nausea And Vomiting   Statins Other (See Comments)    Myalgias   Hydrochlorothiazide Other (See Comments)    Dizziness   Lisinopril Cough     The results of significant diagnostics from this hospitalization (including imaging, microbiology, ancillary and laboratory) are listed below for reference.   Consultations:   Procedures/Studies: DG Chest 2 View Result Date: 01/03/2023 CLINICAL DATA:  47 year old male with history of shortness of breath. EXAM: CHEST - 2 VIEW COMPARISON:  Chest x-ray 01/01/2023. FINDINGS: Lung volumes are low. No consolidative airspace disease. No pleural effusions. No pneumothorax. No pulmonary nodule or mass noted. Pulmonary vasculature and the cardiomediastinal silhouette are within normal limits. IMPRESSION: 1. Low lung volumes without radiographic evidence of acute cardiopulmonary disease. Electronically Signed   By: Trudie Reed M.D.   On: 01/03/2023 07:58   DG Chest Portable 1 View Result Date: 01/01/2023 CLINICAL DATA:  Weakness, fatigue, shortness of breath, cough, sneezing, and eye swelling for 3 days. EXAM: PORTABLE CHEST 1 VIEW COMPARISON:  12/12/2022 FINDINGS: Shallow inspiration. The heart size and mediastinal contours are within normal limits. Both  lungs are clear. The visualized skeletal structures are unremarkable. IMPRESSION: No active disease. Electronically Signed   By: Burman [name] M.D.   On: 01/01/2023 21:21   DG Chest 2 View Result Date: 12/12/2022 CLINICAL DATA:  Shortness of breath. EXAM: CHEST - 2 VIEW COMPARISON:  Chest radiograph dated May 08, 2020. FINDINGS: The heart size and mediastinal contours are within normal limits. Both lungs are clear. No pleural effusion or pneumothorax. No acute osseous abnormality. IMPRESSION: No active cardiopulmonary disease. Electronically Signed   By: Hart Robinsons M.D.   On: 12/12/2022 10:41      Labs: BNP (last 3 results) No results for input(s): "BNP" in the last 8760 hours. Basic Metabolic Panel: Recent Labs  Lab 01/04/23 0151 01/04/23 0659 01/04/23 1217 01/04/23 1601 01/05/23 0303 01/06/23 0431  NA 132* 133*  --  132* 132* 136  K 3.7 4.0 3.6 3.9 3.5 3.8  CL 96* 98  --  96* 96* 94*  CO2 24 24  --  23 27 29   GLUCOSE 127* 135*  --  193* 107* 135*  BUN 12 10  --  8 6 7   CREATININE 0.78 0.84  --  0.85 0.76 0.93  CALCIUM 8.5* 8.2*  --  8.3* 8.5* 8.9  MG  --   --   --  1.7  --  1.7   Liver Function Tests: Recent Labs  Lab 01/01/23 2116 01/03/23 0726  AST 32 49*  ALT 29 32  ALKPHOS 93 81  BILITOT 1.2* 1.9*  PROT 6.6 6.3*  ALBUMIN 3.6 3.4*   No results for input(s): "LIPASE", "AMYLASE" in the last 168 hours. No results for input(s): "AMMONIA" in the last 168 hours. CBC: Recent Labs  Lab 01/01/23 2116 01/03/23 0726 01/05/23 0303 01/06/23 0431  WBC 6.9 10.4 5.3 4.6  HGB 15.0 13.9 12.4* 12.8*  HCT 44.3 43.6 36.7* 38.6*  MCV 93.9 98.0 94.8 97.5  PLT 296 256 170 169   Cardiac Enzymes: No results for input(s): "CKTOTAL", "CKMB", "CKMBINDEX", "TROPONINI" in the last 168 hours. BNP: Invalid input(s): "POCBNP" CBG: Recent Labs  Lab 01/06/23 0303 01/06/23 0415 01/06/23 0513 01/06/23 0604 01/06/23 0731  GLUCAP 177* 145* 127* 132* 148*   D-Dimer No  results for input(s): "DDIMER" in the last 72 hours. Hgb A1c No results for input(s): "HGBA1C" in the last 72 hours. Lipid Profile No results for  input(s): "CHOL", "HDL", "LDLCALC", "TRIG", "CHOLHDL", "LDLDIRECT" in the last 72 hours. Thyroid function studies No results for input(s): "TSH", "T4TOTAL", "T3FREE", "THYROIDAB" in the last 72 hours.  Invalid input(s): "FREET3" Anemia work up No results for input(s): "VITAMINB12", "FOLATE", "FERRITIN", "TIBC", "IRON", "RETICCTPCT" in the last 72 hours. Urinalysis    Component Value Date/Time   COLORURINE YELLOW (A) 01/03/2023 2104   APPEARANCEUR CLEAR (A) 01/03/2023 2104   LABSPEC 1.013 01/03/2023 2104   PHURINE 6.0 01/03/2023 2104   GLUCOSEU NEGATIVE 01/03/2023 2104   HGBUR SMALL (A) 01/03/2023 2104   BILIRUBINUR NEGATIVE 01/03/2023 2104   BILIRUBINUR negative 07/17/2017 1436   KETONESUR 20 (A) 01/03/2023 2104   PROTEINUR 30 (A) 01/03/2023 2104   UROBILINOGEN negative (A) 07/17/2017 1436   UROBILINOGEN 2.0 (H) 12/10/2011 1800   NITRITE NEGATIVE 01/03/2023 2104   LEUKOCYTESUR NEGATIVE 01/03/2023 2104   Sepsis Labs Recent Labs  Lab 01/01/23 2116 01/03/23 0726 01/05/23 0303 01/06/23 0431  WBC 6.9 10.4 5.3 4.6   Microbiology Recent Results (from the past 240 hours)  Resp panel by RT-PCR (RSV, Flu A&B, Covid) Urine, Clean Catch     Status: None   Collection Time: 01/01/23  9:16 PM   Specimen: Urine, Clean Catch; Nasal Swab  Result Value Ref Range Status   SARS Coronavirus 2 by RT PCR NEGATIVE NEGATIVE Final    Comment: (NOTE) SARS-CoV-2 target nucleic acids are NOT DETECTED.  The SARS-CoV-2 RNA is generally detectable in upper respiratory specimens during the acute phase of infection. The lowest concentration of SARS-CoV-2 viral copies this assay can detect is 138 copies/mL. A negative result does not preclude SARS-Cov-2 infection and should not be used as the sole basis for treatment or other patient management decisions.  A negative result may occur with  improper specimen collection/handling, submission of specimen other than nasopharyngeal swab, presence of viral mutation(s) within the areas targeted by this assay, and inadequate number of viral copies(<138 copies/mL). A negative result must be combined with clinical observations, patient history, and epidemiological information. The expected result is Negative.  Fact Sheet for Patients:  BloggerCourse.com  Fact Sheet for Healthcare Providers:  SeriousBroker.it  This test is no t yet approved or cleared by the [place] FDA and  has been authorized for detection and/or diagnosis of SARS-CoV-2 by FDA under an Emergency Use Authorization (EUA). This EUA will remain  in effect (meaning this test can be used) for the duration of the COVID-19 declaration under Section 564(b)(1) of the Act, 21 U.S.C.section 360bbb-3(b)(1), unless the authorization is terminated  or revoked sooner.       Influenza A by PCR NEGATIVE NEGATIVE Final   Influenza B by PCR NEGATIVE NEGATIVE Final    Comment: (NOTE) The Xpert Xpress SARS-CoV-2/FLU/RSV plus assay is intended as an aid in the diagnosis of influenza from Nasopharyngeal swab specimens and should not be used as a sole basis for treatment. Nasal washings and aspirates are unacceptable for Xpert Xpress SARS-CoV-2/FLU/RSV testing.  Fact Sheet for Patients: BloggerCourse.com  Fact Sheet for Healthcare Providers: SeriousBroker.it  This test is not yet approved or cleared by the [place] FDA and has been authorized for detection and/or diagnosis of SARS-CoV-2 by FDA under an Emergency Use Authorization (EUA). This EUA will remain in effect (meaning this test can be used) for the duration of the COVID-19 declaration under Section 564(b)(1) of the Act, 21 U.S.C. section 360bbb-3(b)(1), unless the authorization  is terminated or revoked.     Resp Syncytial Virus by PCR  NEGATIVE NEGATIVE Final    Comment: (NOTE) Fact Sheet for Patients: BloggerCourse.com  Fact Sheet for Healthcare Providers: SeriousBroker.it  This test is not yet approved or cleared by the [place] FDA and has been authorized for detection and/or diagnosis of SARS-CoV-2 by FDA under an Emergency Use Authorization (EUA). This EUA will remain in effect (meaning this test can be used) for the duration of the COVID-19 declaration under Section 564(b)(1) of the Act, 21 U.S.C. section 360bbb-3(b)(1), unless the authorization is terminated or revoked.  Performed at [facility], [address] [place], [place] [address]   Resp panel by RT-PCR (RSV, Flu A&B, Covid) Anterior Nasal Swab     Status: None   Collection Time: 01/03/23  7:26 AM   Specimen: Anterior Nasal Swab  Result Value Ref Range Status   SARS Coronavirus 2 by RT PCR NEGATIVE NEGATIVE Final    Comment: (NOTE) SARS-CoV-2 target nucleic acids are NOT DETECTED.  The SARS-CoV-2 RNA is generally detectable in upper respiratory specimens during the acute phase of infection. The lowest concentration of SARS-CoV-2 viral copies this assay can detect is 138 copies/mL. A negative result does not preclude SARS-Cov-2 infection and should not be used as the sole basis for treatment or other patient management decisions. A negative result may occur with  improper specimen collection/handling, submission of specimen other than nasopharyngeal swab, presence of viral mutation(s) within the areas targeted by this assay, and inadequate number of viral copies(<138 copies/mL). A negative result must be combined with clinical observations, patient history, and epidemiological information. The expected result is Negative.  Fact Sheet for Patients:  BloggerCourse.com  Fact Sheet for Healthcare  Providers:  SeriousBroker.it  This test is no t yet approved or cleared by the [place] FDA and  has been authorized for detection and/or diagnosis of SARS-CoV-2 by FDA under an Emergency Use Authorization (EUA). This EUA will remain  in effect (meaning this test can be used) for the duration of the COVID-19 declaration under Section 564(b)(1) of the Act, 21 U.S.C.section 360bbb-3(b)(1), unless the authorization is terminated  or revoked sooner.       Influenza A by PCR NEGATIVE NEGATIVE Final   Influenza B by PCR NEGATIVE NEGATIVE Final    Comment: (NOTE) The Xpert Xpress SARS-CoV-2/FLU/RSV plus assay is intended as an aid in the diagnosis of influenza from Nasopharyngeal swab specimens and should not be used as a sole basis for treatment. Nasal washings and aspirates are unacceptable for Xpert Xpress SARS-CoV-2/FLU/RSV testing.  Fact Sheet for Patients: BloggerCourse.com  Fact Sheet for Healthcare Providers: SeriousBroker.it  This test is not yet approved or cleared by the [place] FDA and has been authorized for detection and/or diagnosis of SARS-CoV-2 by FDA under an Emergency Use Authorization (EUA). This EUA will remain in effect (meaning this test can be used) for the duration of the COVID-19 declaration under Section 564(b)(1) of the Act, 21 U.S.C. section 360bbb-3(b)(1), unless the authorization is terminated or revoked.     Resp Syncytial Virus by PCR NEGATIVE NEGATIVE Final    Comment: (NOTE) Fact Sheet for Patients: BloggerCourse.com  Fact Sheet for Healthcare Providers: SeriousBroker.it  This test is not yet approved or cleared by the [place] FDA and has been authorized for detection and/or diagnosis of SARS-CoV-2 by FDA under an Emergency Use Authorization (EUA). This EUA will remain in effect (meaning this test can be  used) for the duration of the COVID-19 declaration under Section 564(b)(1) of the Act, 21 U.S.C. section  360bbb-3(b)(1), unless the authorization is terminated or revoked.  Performed at [facility], [address] [place], [place] [address]   MRSA Next Gen by PCR, Nasal     Status: None   Collection Time: 01/04/23  1:31 AM   Specimen: Nasal Mucosa; Nasal Swab  Result Value Ref Range Status   MRSA by PCR Next Gen NOT DETECTED NOT DETECTED Final    Comment: (NOTE) The GeneXpert MRSA Assay (FDA approved for NASAL specimens only), is one component of a comprehensive MRSA colonization surveillance program. It is not intended to diagnose MRSA infection nor to guide or monitor treatment for MRSA infections. Test performance is not FDA approved in patients less than 27 years old. Performed at [facility], [address] [place], [place] [address]      Total time spend on discharging this patient, including the last patient exam, discussing the hospital stay, instructions for ongoing care as it relates to all pertinent caregivers, as well as preparing the medical discharge records, prescriptions, and/or referrals as applicable, is 35 minutes.    Darlin Priestly, MD  [facility] 01/06/2023, 8:34 AM

## 2023-01-06 NOTE — Plan of Care (Signed)
Problem: Education: Goal: Ability to describe self-care measures that may prevent or decrease complications (Diabetes Survival Skills Education) will improve 01/06/2023 0910 by Bennetta Laos, RN Outcome: Adequate for Discharge 01/06/2023 0909 by Bennetta Laos, RN Outcome: Adequate for Discharge   Problem: Coping: Goal: Ability to adjust to condition or change in health will improve 01/06/2023 0910 by Bennetta Laos, RN Outcome: Adequate for Discharge 01/06/2023 0909 by Bennetta Laos, RN Outcome: Adequate for Discharge   Problem: Fluid Volume: Goal: Ability to maintain a balanced intake and output will improve 01/06/2023 0910 by Bennetta Laos, RN Outcome: Adequate for Discharge 01/06/2023 0909 by Bennetta Laos, RN Outcome: Adequate for Discharge   Problem: Health Behavior/Discharge Planning: Goal: Ability to identify and utilize available resources and services will improve 01/06/2023 0910 by Bennetta Laos, RN Outcome: Adequate for Discharge 01/06/2023 0909 by Bennetta Laos, RN Outcome: Adequate for Discharge Goal: Ability to manage health-related needs will improve 01/06/2023 0910 by Bennetta Laos, RN Outcome: Adequate for Discharge 01/06/2023 0909 by Bennetta Laos, RN Outcome: Adequate for Discharge   Problem: Metabolic: Goal: Ability to maintain appropriate glucose levels will improve 01/06/2023 0910 by Bennetta Laos, RN Outcome: Adequate for Discharge 01/06/2023 0909 by Bennetta Laos, RN Outcome: Adequate for Discharge   Problem: Nutritional: Goal: Maintenance of adequate nutrition will improve 01/06/2023 0910 by Bennetta Laos, RN Outcome: Adequate for Discharge 01/06/2023 0909 by Bennetta Laos, RN Outcome: Adequate for Discharge Goal: Progress toward achieving an optimal weight will improve 01/06/2023 0910 by Bennetta Laos, RN Outcome: Adequate for  Discharge 01/06/2023 0909 by Bennetta Laos, RN Outcome: Adequate for Discharge   Problem: Education: Goal: Ability to describe self-care measures that may prevent or decrease complications (Diabetes Survival Skills Education) will improve 01/06/2023 0910 by Bennetta Laos, RN Outcome: Adequate for Discharge 01/06/2023 0909 by Bennetta Laos, RN Outcome: Adequate for Discharge   Problem: Health Behavior/Discharge Planning: Goal: Ability to identify and utilize available resources and services will improve 01/06/2023 0910 by Bennetta Laos, RN Outcome: Adequate for Discharge 01/06/2023 0909 by Bennetta Laos, RN Outcome: Adequate for Discharge Goal: Ability to manage health-related needs will improve 01/06/2023 0910 by Bennetta Laos, RN Outcome: Adequate for Discharge 01/06/2023 0909 by Bennetta Laos, RN Outcome: Adequate for Discharge   Problem: Fluid Volume: Goal: Ability to achieve a balanced intake and output will improve 01/06/2023 0910 by Bennetta Laos, RN Outcome: Adequate for Discharge 01/06/2023 0909 by Bennetta Laos, RN Outcome: Adequate for Discharge   Problem: Metabolic: Goal: Ability to maintain appropriate glucose levels will improve 01/06/2023 0910 by Bennetta Laos, RN Outcome: Adequate for Discharge 01/06/2023 0909 by Bennetta Laos, RN Outcome: Adequate for Discharge   Problem: Nutritional: Goal: Maintenance of adequate nutrition will improve 01/06/2023 0910 by Bennetta Laos, RN Outcome: Adequate for Discharge 01/06/2023 0909 by Bennetta Laos, RN Outcome: Adequate for Discharge Goal: Maintenance of adequate weight for body size and type will improve 01/06/2023 0910 by Bennetta Laos, RN Outcome: Adequate for Discharge 01/06/2023 0909 by Bennetta Laos, RN Outcome: Adequate for Discharge   Problem: Education: Goal: Knowledge of General Education information  will improve Description: Including pain rating scale, medication(s)/side effects and non-pharmacologic comfort measures Outcome: Adequate for Discharge   Problem: Health Behavior/Discharge Planning: Goal: Ability to manage health-related needs will improve Outcome: Adequate for Discharge   Problem: Clinical Measurements: Goal: Ability to maintain clinical measurements within  normal limits will improve Outcome: Adequate for Discharge Goal: Diagnostic test results will improve Outcome: Adequate for Discharge   Problem: Nutrition: Goal: Adequate nutrition will be maintained Outcome: Adequate for Discharge   Problem: Coping: Goal: Level of anxiety will decrease Outcome: Adequate for Discharge   Problem: Pain Management: Goal: General experience of comfort will improve Outcome: Adequate for Discharge

## 2023-02-01 ENCOUNTER — Encounter: Payer: Self-pay | Admitting: Family Medicine

## 2023-02-01 ENCOUNTER — Ambulatory Visit: Payer: Self-pay | Admitting: Family Medicine

## 2023-02-01 ENCOUNTER — Ambulatory Visit: Payer: Medicaid Other | Admitting: Family Medicine

## 2023-02-01 VITALS — BP 137/85 | HR 98 | Ht 72.0 in | Wt 324.0 lb

## 2023-02-01 DIAGNOSIS — H6991 Unspecified Eustachian tube disorder, right ear: Secondary | ICD-10-CM | POA: Diagnosis not present

## 2023-02-01 NOTE — Telephone Encounter (Signed)
  Chief Complaint:  Having trouble breathing - feels like breathing through ears - eyes swollen - nostril obstructed  Symptoms: Nose blocked unable to clear - face and Eyes puffy Frequency: Started Thursday but began to worse this morning Pertinent Negatives: Patient denies Fever. Disposition: [x] ED /[] Urgent Care (no appt availability in office) / [] Appointment(In office/virtual)/ []  [facility]/ [] Home Care/ [] Refused Recommended Disposition /[] [facility]/ []  Follow-up with PCP Additional Notes: ER Now. Encouraged to follow up with PCP for aftercare. Wife and Patient verbalized understanding.   Copied from CRM [id number]. Topic: Clinical - Red Word Triage >> Feb 01, 2023 11:59 AM Fuller Mandril wrote: Red Word that prompted transfer to Nurse Triage: Having trouble breathing - feels like breathing through ears - eyes swollen - nostril obstructed Reason for Disposition  [1] Difficulty breathing AND [2] not from stuffy nose (e.g., not relieved by cleaning out the nose)  Answer Assessment - Initial Assessment Questions 1. LOCATION: "Where does it hurt?"       Sinus  - Face puffy and Eyes are puffy  2. ONSET: "When did the sinus pain start?"  (e.g., hours, days)      Started on Last Thursday 3. SEVERITY: "How bad is the pain?"   (Scale 1-10; mild, moderate or severe)     - SEVERE (8-10): excruciating pain and patient unable to do any normal activities        Severe 4. RECURRENT SYMPTOM: "Have you ever had sinus problems before?" If Yes, ask: "When was the last time?" and "What happened that time?"       Sinus Infection  last year 5. NASAL CONGESTION: "Is the nose blocked?" If Yes, ask: "Can you open it or must you breathe through your mouth?"      Nose is blocked 6. NASAL DISCHARGE: "Do you have discharge from your nose?" If so ask, "What color?"     Denies 7. FEVER: "Do you have a fever?" If Yes, ask: "What is it, how was it measured, and when did it start?"       denies 8. OTHER SYMPTOMS: "Do you have any other symptoms?" (e.g., sore throat, cough, earache, difficulty breathing)     Ear feel clogged like he is breathing  Protocols used: Sinus Pain or Congestion-A-AH

## 2023-02-01 NOTE — Telephone Encounter (Signed)
Pt seen in office today.

## 2023-02-01 NOTE — Telephone Encounter (Signed)
Chief Complaint: Nasal congestion Symptoms: Nasal congestion/running, ear pain, mild cough Frequency: since Thursday Pertinent Negatives: Patient denies fever, difficulty breathing Disposition: [] ED /[] Urgent Care (no appt availability in office) / [x] Appointment(In office/virtual)/ []  [facility]/ [] Home Care/ [] Refused Recommended Disposition /[] [facility]/ []  Follow-up with PCP Additional Notes: Patient and wife called in stating they spoke to a nurse earlier that advised them on ER visit due to difficulty breathing. However; patient states his difficulty breathing is not severe or emergent, and describes it as nose congestion. Patient denies fever. Patient states main symptoms are left ear pain, runny nose/congestion, mild cough. Patient appointment scheduled for tomorrow.    Copied from CRM [id number]. Topic: Clinical - Red Word Triage >> Feb 01, 2023 12:15 PM Dennison Nancy wrote: Red Word that prompted transfer to Nurse Triage:   called earlier this morning about have breathing issues spouse Marchelle Folks Hinderman called ask to speak with Morrie Sheldon  reason so patient can schedule an appointment with Saralyn Pilar , patient stated he do not think he need to go to the emergency room Reason for Disposition  Earache  Answer Assessment - Initial Assessment Questions 1. LOCATION: "Where does it hurt?"      Right ear, right cheek 2. ONSET: "When did the sinus pain start?"  (e.g., hours, days)      Thursday - progressively gotten worse 3. SEVERITY: "How bad is the pain?"   (Scale 1-10; mild, moderate or severe)   - MILD (1-3): doesn't interfere with normal activities    - MODERATE (4-7): interferes with normal activities (e.g., work or school) or awakens from sleep   - SEVERE (8-10): excruciating pain and patient unable to do any normal activities        Right ear pain, right cheek hurts moderate 5. NASAL CONGESTION: "Is the nose blocked?" If Yes, ask: "Can you open it or must you  breathe through your mouth?"     Nose is blocked up 6. NASAL DISCHARGE: "Do you have discharge from your nose?" If so ask, "What color?"     Yes, clear 7. FEVER: "Do you have a fever?" If Yes, ask: "What is it, how was it measured, and when did it start?"      No 8. OTHER SYMPTOMS: "Do you have any other symptoms?" (e.g., sore throat, cough, earache, difficulty breathing) Ear pain on right side, mild nasal drainage  Protocols used: Sinus Pain or Congestion-A-AH

## 2023-02-01 NOTE — Assessment & Plan Note (Signed)
Perhaps due to a viral infection.  Does not appear to be bacterial otitis media.  Right tympanic membrane did seem slightly retracted.  Recommended using nasal decongestant, Flonase, nasal saline to help decrease inflammation.  Advised if symptoms are not better by Monday or if symptoms seem to worsen between now and then, will prescribe antibiotics for a possible bacterial sinusitis which the patient has had in the past.

## 2023-02-01 NOTE — Progress Notes (Signed)
   Acute Office Visit  Subjective:     Patient ID: Michael Hutchinson, male    DOB: Jun 29, 1975, 48 y.o.   MRN: [id number]  Chief Complaint  Patient presents with   Ear Fullness    HPI Patient is in today for ears complaints.  Patient's symptoms started last Thursday.  Right ear is worse than left.  Having pain, muffled hearing.  States it feels like something is "whistling" in his right ear.  Has nasal congestion and rhinorrhea as well.  No headaches.  No fevers or chills.  Patient recently in the ICU for acidosis related to his diabetes.  He has history of allergies that usually bother him in the springtime.  Takes Zyrtec and Flonase.  Has had issues with sinusitis requiring antibiotics in the past, most recently within the past 6 months.  ROS      Objective:    BP 137/85   Pulse 98   Ht 6' (1.829 m)   Wt (!) 324 lb (147 kg)   SpO2 97%   BMI 43.94 kg/m    Physical Exam General: Alert, oriented HEENT: Right tympanic membrane does not show any evidence of perforation, although the entire membrane could not be visualized due to patient's anatomy.  Tympanic membrane does appear slightly retracted. CV: Rate rhythm Pulmonary: Lungs clear bilaterally no wheezes.  No respiratory distress.  No results found for any visits on 02/01/23.      Assessment & Plan:   Eustachian tube dysfunction, right Assessment & Plan: Perhaps due to a viral infection.  Does not appear to be bacterial otitis media.  Right tympanic membrane did seem slightly retracted.  Recommended using nasal decongestant, Flonase, nasal saline to help decrease inflammation.  Advised if symptoms are not better by Monday or if symptoms seem to worsen between now and then, will prescribe antibiotics for a possible bacterial sinusitis which the patient has had in the past.      Return if symptoms worsen or fail to improve.  Sandre Kitty, MD

## 2023-02-01 NOTE — Patient Instructions (Signed)
It was nice to see you today,  We addressed the following topics today: -I would like you to try the following before we decide if starting antibiotics is necessary: Use over-the-counter Afrin/generic equivalent nasal spray as needed for rapid relief. Use your Flonase to 2 sprays daily until you feel better Use nasal saline spray as needed for recurring nasal congestion. - If by Monday you do not feel like your symptoms are improving let us know by calling or sending a MyChart message and we will prescribe antibiotics - If you feel like your symptoms are worsening  before that time, or if you develop fevers or other signs of serious illness let us know.  Have a great day,  Frederic Jericho, MD

## 2023-02-02 ENCOUNTER — Ambulatory Visit: Payer: Medicaid Other | Admitting: Family Medicine

## 2023-02-05 ENCOUNTER — Encounter: Payer: Self-pay | Admitting: Family Medicine

## 2023-02-06 MED ORDER — AMOXICILLIN-POT CLAVULANATE 875-125 MG PO TABS: 1.0000 | ORAL_TABLET | Freq: Two times a day (BID) | ORAL | 0 refills | Status: AC

## 2023-02-16 ENCOUNTER — Encounter: Payer: Self-pay | Admitting: Family Medicine

## 2023-02-20 NOTE — Progress Notes (Signed)
NEUROLOGY FOLLOW UP OFFICE NOTE  Michael Hutchinson [id number]  Assessment/Plan:   1  Multiple sclerosis:  While ischemic cranial nerve palsies are seen in uncontrolled diabetes, given the 3 distinct episodes of various cranial nerve palsies, the appearance of white matter lesions on brain MRI, and CSF analysis demonstrated for oligoclonal bands not present in serum, I suspect MS.  He does have significantly elevated CSF protein but does not exhibit any sign of infection.  Diabetes may be contributor to this. 2  Possible episodic cluster headaches 3  Type 1 diabetes mellitus 4  Asymmetric pupils, clouding of left pupil - suspect diabetic-related but also possibly a cataract?  He denies routine eye exams and given his uncontrolled type 1 diabetes, he should be followed by ophthalmology anyway.     DMT:  Emogene Morgan.  Would remain on Ocrevus for now.  Unclear if his symptoms are actually related to his other health issues.  Would review MRI first before making decision to change. Check CBC with diff, LFTs, quantitative immunoglobulin panel and vit D today  Check MRI of brain with and without contrast - prescribed diazepam (aware that he needs a driver to and from the facility). Follow up 6 months   Subjective:  Michael Hutchinson is a 48 year old right-handed Caucasian man with type 1 diabetes with history of DKA, hypertension, Charcot's joint of foot, left below the knee amputation, hyperlipidemia and GERD who follows up for multiple sclerosis   UPDATE: Current disease modifying therapy: Ocrevus (status post 1 infusion - next infusion next month) Other current medications: D3 6000 IU daily      Ocrevus has variable efficacy in regards to his overall wellbeing.  Sometimes he feels good after an infusion.  Sometimes his balance is off.   Vision:  OK Motor:  Sometimes notes tremors in either hand, primarily the right.   Sensory:  No numbness. Pain:  Pain in low back radiating down legs.   Hurts to stand. . Gait:  as above Bowel/Bladder:  No issues. Fatigue:  yes.    Sleep study negative for OSA.   Cognition:  no Mood:  No depression     HISTORY:  In October 2016, he developed a right 6th cranial nerve palsy which was thought to be due to his uncontrolled type 1 diabetes.   In March 2017, he developed blurred vision in the left eye.  He saw ophthalmologist Dr. Mack Hook who diagnosed optic neuritis.  MRI of the brain and orbits with and without contrast from 05/11/2015 revealed "2 T2 hyperintense lesions within the left hemisphere adjacent to the posterior horn of the left lateral ventricle ", a nonspecific finding and without abnormal contrast enhancement.   In March 2019, he developed disconjugate gaze and droopy left eyelid.  He saw the eye doctor who diagnosed a left 3rd nerve palsy.  He was evaluated in the ED at [facility] on [facility]/28/2017.  CTA of the head was negative for aneurysm.  MRI of the brain with and without contrast demonstrated multiple T2/flair hyperintense foci involving the periventricular, deep and subcortical white matter, progressed compared to prior MRI from 2017.  In-house neurology believed his findings were more likely due to a diabetic 3rd nerve palsy rather than MS.  He was advised to start aspirin 81 mg daily and follow-up with outpatient neurology.  He subsequently underwent a work-up for multiple sclerosis.  MRI of the cervical spine with and without contrast from 05/04/2017 revealed no cord lesions.  He  underwent lumbar puncture on 05/19/2017 for CSF analysis which revealed elevated IgG index of 0.79 and 4 oligoclonal bands not present in the corresponding serum.  Cell count was 1, elevated protein 157, elevated glucose 100, negative myelin basic protein, ACE 4, Gram stain and culture negative.   Past disease modifying therapy: Tecfidera (GI upset), Ocrevus (hives but declined premedicated Benadryl)   Imaging: 05/11/2015 MRI BRAIN W WO:  Two T2  hyperintense lesions within the left hemisphere adjacent to the posterior horn of the left lateral ventricle. The finding is nonspecific but can be seen in the setting of chronic microvascular ischemia, a demyelinating process such as multiple sclerosis, vasculitis, complicated migraine headaches, or as the sequelae of a prior infectious or inflammatory process. 05/11/2015 MRI ORBITS W WO:  Within normal limits 03/28/2017 MRI BRAIN & ORBITS W WO:  1. Multiple T2/FLAIR hyperintense foci involving the periventricular, deep, and subcortical white matter of both cerebral hemispheres in a distribution highly suspicious for possible demyelinating disease/multiple sclerosis. Changes have progressed relative to 2017. No evidence for active demyelination.  2. Negative MRI of the orbits. No findings to suggest acute optic neuritis or other abnormality identified. 3. Bilateral mastoid effusions, left greater than right. 05/04/2017 MRI CERVICAL SPINE W WO:  Normal 02/25/2018 MRI BRAIN W WO: No change since the study of March 2019. Multiple foci of abnormal T2 and FLAIR signal within the cerebral hemispheric deep and subcortical white matter, consistent with the clinical diagnosis of multiple sclerosis. No new or progressive lesions. No lesions show contrast enhancement or restricted diffusion.  PAST MEDICAL HISTORY: Past Medical History:  Diagnosis Date   Charcot's joint of foot 11/25/2013   Complication of anesthesia    "I wake up angry" (12/31/2015)   Diabetes mellitus type 1 (HCC) dx'd 1981   Diabetic ketoacidosis (HCC)    Essential hypertension 05/03/2013   GERD (gastroesophageal reflux disease)    High cholesterol    Hx MRSA infection    Inner thigh and under arm- healed areas   Meniscus tear     MEDICATIONS: Current Outpatient Medications on File Prior to Visit  Medication Sig Dispense Refill   acetone, urine, test strip 1 strip by Does not apply route as needed for high blood sugar. 50 each 0    albuterol (VENTOLIN HFA) 108 (90 Base) MCG/ACT inhaler Inhale 2 puffs into the lungs every 6 (six) hours as needed for wheezing or shortness of breath. 8 g 2   amLODipine (NORVASC) 5 MG tablet Take 1 tablet (5 mg total) by mouth daily. 90 tablet 3   Blood Pressure Monitoring (BLOOD PRESSURE MONITOR/L CUFF) MISC Use blood pressure cuff and monitor to check blood pressure at home at least several times per week if not daily. 1 each 0   cetirizine (ZYRTEC ALLERGY) 10 MG tablet Take 1 tablet (10 mg total) by mouth daily as needed for allergies. Home med.     Continuous Blood Gluc Receiver (DEXCOM G6 RECEIVER) DEVI See admin instructions.     Continuous Blood Gluc Sensor (DEXCOM G6 SENSOR) MISC SMARTSIG:1 Topical Every 10 Days     Continuous Blood Gluc Transmit (DEXCOM G6 TRANSMITTER) MISC DISPENSE AND USE AS DIRECTED     ezetimibe (ZETIA) 10 MG tablet Take 1 tablet (10 mg total) by mouth daily. 90 tablet 3   fluticasone (FLONASE) 50 MCG/ACT nasal spray Place 2 sprays into both nostrils daily as needed for allergies or rhinitis.     glucose blood (BAYER CONTOUR NEXT TEST) test strip 1  each by Other route 4 (four) times daily. And lancets 4/day 400 each 3   [name] 200 UNIT/ML KwikPen 1 unit per 12 grams of carbs, and Novolog 1 unit per every 50 mg/dl above target glucose.  While off insulin pump.     HYDROcodone-acetaminophen (NORCO/VICODIN) 5-325 MG tablet Take 1 tablet by mouth every 4 (four) hours as needed. 30 tablet 0   Insulin Disposable Pump (OMNIPOD 5 G6 PODS, GEN 5,) MISC Inject 1 Device into the skin daily.     insulin glargine (LANTUS SOLOSTAR) 100 UNIT/ML Solostar Pen Inject 20 Units into the skin daily. While off insulin pump. 3 mL 2   Insulin Pen Needle (PEN NEEDLES) 32G X 4 MM MISC USE 5 TIMES DAILY AS DIRECTED     losartan (COZAAR) 50 MG tablet TAKE 1 TABLET BY MOUTH ONCE DAILY 90 tablet 1   metoprolol succinate (TOPROL-XL) 100 MG 24 hr tablet Take 1 tablet (100 mg total) by  mouth daily. TAKE WITH OR IMMEDIATELY FOLLOWING A MEAL. 90 tablet 1   Multiple Vitamins-Minerals (MULTIVITAMIN WITH MINERALS) tablet Take 1 tablet by mouth daily.     OCREVUS 300 MG/10ML injection Inject 300 mg into the vein every 6 (six) months.     ondansetron (ZOFRAN-ODT) 4 MG disintegrating tablet Take 1 tablet (4 mg total) by mouth every 8 (eight) hours as needed for nausea or vomiting. 12 tablet 0   rizatriptan (MAXALT-MLT) 10 MG disintegrating tablet Take 1 tablet earliest onset of headache.  May repeat after 2 hours if needed. Maximum 2 tablets in 24 hours 10 tablet 5   No current facility-administered medications on file prior to visit.    ALLERGIES: Allergies  Allergen Reactions   Influenza Vac Split Quad Nausea And Vomiting   Influenza Virus Vaccine Split Nausea And Vomiting   Statins Other (See Comments)    Myalgias   Hydrochlorothiazide Other (See Comments)    Dizziness   Lisinopril Cough    FAMILY HISTORY: Family History  Problem Relation Age of Onset   Other Mother    Cancer Mother        Breast / Bone   Heart attack Father    Hypertension Father    Hyperlipidemia Father    Diabetes Other    Alcohol abuse Sister    Diabetes Maternal Grandfather    Stroke Paternal Grandmother    Alcohol abuse Paternal Grandfather       Objective:  Blood pressure (!) 143/68, pulse 96, resp. rate 20, height 6' (1.829 m), weight (!) 327 lb (148.3 kg), SpO2 96%. General: No acute distress.  Patient appears well-groomed.   Head:  Normocephalic/atraumatic Eyes:  Fundi examined but not visualized Neck: supple, no paraspinal tenderness, full range of motion Heart:  Regular rate and rhythm Neurological Exam: alert and oriented.  Speech fluent and not dysarthric, language intact.  Asymmetric pupils (OD > OS), OS poorly reactive with clouding.  Otherwise, CN II-XII intact. Bulk and tone normal, muscle strength 5/5 throughout (left BTK amputation).  Sensation to light touch intact.   Deep tendon reflexes absent throughout, toes downgoing.  Finger to nose testing intact.  Gait stable   Michael Millet, DO  CC: Saralyn Pilar, [place]

## 2023-02-21 ENCOUNTER — Ambulatory Visit (INDEPENDENT_AMBULATORY_CARE_PROVIDER_SITE_OTHER): Payer: Medicaid Other | Admitting: Neurology

## 2023-02-21 ENCOUNTER — Encounter: Payer: Self-pay | Admitting: Neurology

## 2023-02-21 VITALS — BP 143/68 | HR 96 | Resp 20 | Ht 72.0 in | Wt 327.0 lb

## 2023-02-21 DIAGNOSIS — R9082 White matter disease, unspecified: Secondary | ICD-10-CM | POA: Diagnosis not present

## 2023-02-21 DIAGNOSIS — G35 Multiple sclerosis: Secondary | ICD-10-CM

## 2023-02-21 DIAGNOSIS — H4902 Third [oculomotor] nerve palsy, left eye: Secondary | ICD-10-CM

## 2023-02-21 MED ORDER — DIAZEPAM 10 MG PO TABS: ORAL_TABLET | ORAL | 0 refills | Status: DC

## 2023-02-21 NOTE — Patient Instructions (Signed)
Ocrevus Check MRI of brain with and without contrast.  Diazepam prescribed. Check labs:  CBC with diff, LFTs, immunoglobulin panel, vit D Follow up 6 month.

## 2023-02-23 ENCOUNTER — Encounter: Payer: Self-pay | Admitting: Neurology

## 2023-02-25 ENCOUNTER — Emergency Department: Payer: Medicaid Other

## 2023-02-25 ENCOUNTER — Encounter: Payer: Self-pay | Admitting: Emergency Medicine

## 2023-02-25 ENCOUNTER — Other Ambulatory Visit: Payer: Self-pay

## 2023-02-25 ENCOUNTER — Observation Stay
Admission: EM | Admit: 2023-02-25 | Discharge: 2023-02-26 | Disposition: A | Discharge status: Home / Self Care | Payer: Medicaid Other | Attending: Student | Admitting: Student

## 2023-02-25 DIAGNOSIS — F10920 Alcohol use, unspecified with intoxication, uncomplicated: Secondary | ICD-10-CM | POA: Insufficient documentation

## 2023-02-25 DIAGNOSIS — G894 Chronic pain syndrome: Secondary | ICD-10-CM | POA: Diagnosis not present

## 2023-02-25 DIAGNOSIS — R531 Weakness: Secondary | ICD-10-CM

## 2023-02-25 DIAGNOSIS — K219 Gastro-esophageal reflux disease without esophagitis: Secondary | ICD-10-CM | POA: Diagnosis present

## 2023-02-25 DIAGNOSIS — Z79899 Other long term (current) drug therapy: Secondary | ICD-10-CM | POA: Diagnosis not present

## 2023-02-25 DIAGNOSIS — Z7902 Long term (current) use of antithrombotics/antiplatelets: Secondary | ICD-10-CM | POA: Diagnosis not present

## 2023-02-25 DIAGNOSIS — E1159 Type 2 diabetes mellitus with other circulatory complications: Secondary | ICD-10-CM | POA: Diagnosis present

## 2023-02-25 DIAGNOSIS — Z794 Long term (current) use of insulin: Secondary | ICD-10-CM | POA: Insufficient documentation

## 2023-02-25 DIAGNOSIS — E1010 Type 1 diabetes mellitus with ketoacidosis without coma: Secondary | ICD-10-CM | POA: Diagnosis not present

## 2023-02-25 DIAGNOSIS — G459 Transient cerebral ischemic attack, unspecified: Secondary | ICD-10-CM | POA: Diagnosis not present

## 2023-02-25 DIAGNOSIS — I10 Essential (primary) hypertension: Secondary | ICD-10-CM | POA: Insufficient documentation

## 2023-02-25 DIAGNOSIS — R4781 Slurred speech: Secondary | ICD-10-CM | POA: Diagnosis present

## 2023-02-25 DIAGNOSIS — E1065 Type 1 diabetes mellitus with hyperglycemia: Secondary | ICD-10-CM | POA: Diagnosis present

## 2023-02-25 DIAGNOSIS — E1110 Type 2 diabetes mellitus with ketoacidosis without coma: Secondary | ICD-10-CM | POA: Diagnosis present

## 2023-02-25 DIAGNOSIS — Z7982 Long term (current) use of aspirin: Secondary | ICD-10-CM | POA: Diagnosis not present

## 2023-02-25 DIAGNOSIS — I152 Hypertension secondary to endocrine disorders: Secondary | ICD-10-CM | POA: Diagnosis present

## 2023-02-25 LAB — PROTIME-INR
INR: 1 (ref 0.8–1.2)
Prothrombin Time: 12.8 s (ref 11.4–15.2)

## 2023-02-25 LAB — DIFFERENTIAL
Abs Immature Granulocytes: 0.05 10*3/uL (ref 0.00–0.07)
Basophils Absolute: 0 10*3/uL (ref 0.0–0.1)
Basophils Relative: 0 %
Eosinophils Absolute: 0 10*3/uL (ref 0.0–0.5)
Eosinophils Relative: 0 %
Immature Granulocytes: 1 %
Lymphocytes Relative: 12 %
Lymphs Abs: 1.1 10*3/uL (ref 0.7–4.0)
Monocytes Absolute: 1.1 10*3/uL — ABNORMAL HIGH (ref 0.1–1.0)
Monocytes Relative: 12 %
Neutro Abs: 7 10*3/uL (ref 1.7–7.7)
Neutrophils Relative %: 75 %

## 2023-02-25 LAB — CBC
HCT: 38.9 % — ABNORMAL LOW (ref 39.0–52.0)
Hemoglobin: 13.3 g/dL (ref 13.0–17.0)
MCH: 31.2 pg (ref 26.0–34.0)
MCHC: 34.2 g/dL (ref 30.0–36.0)
MCV: 91.3 fL (ref 80.0–100.0)
Platelets: 285 10*3/uL (ref 150–400)
RBC: 4.26 MIL/uL (ref 4.22–5.81)
RDW: 13.4 % (ref 11.5–15.5)
WBC: 9.3 10*3/uL (ref 4.0–10.5)
nRBC: 0 % (ref 0.0–0.2)

## 2023-02-25 LAB — CBG MONITORING, ED: Glucose-Capillary: 332 mg/dL — ABNORMAL HIGH (ref 70–99)

## 2023-02-25 LAB — APTT: aPTT: 30 s (ref 24–36)

## 2023-02-25 LAB — COMPREHENSIVE METABOLIC PANEL
ALT: 15 U/L (ref 0–44)
AST: 14 U/L — ABNORMAL LOW (ref 15–41)
Albumin: 3.8 g/dL (ref 3.5–5.0)
Alkaline Phosphatase: 107 U/L (ref 38–126)
Anion gap: 17 — ABNORMAL HIGH (ref 5–15)
BUN: 15 mg/dL (ref 6–20)
CO2: 21 mmol/L — ABNORMAL LOW (ref 22–32)
Calcium: 8.8 mg/dL — ABNORMAL LOW (ref 8.9–10.3)
Chloride: 96 mmol/L — ABNORMAL LOW (ref 98–111)
Creatinine, Ser: 1.07 mg/dL (ref 0.61–1.24)
GFR, Estimated: >60 mL/min (ref >60)
Glucose, Bld: 335 mg/dL — ABNORMAL HIGH (ref 70–99)
Potassium: 3.8 mmol/L (ref 3.5–5.1)
Sodium: 134 mmol/L — ABNORMAL LOW (ref 135–145)
Total Bilirubin: 0.7 mg/dL (ref 0.0–1.2)
Total Protein: 7.2 g/dL (ref 6.5–8.1)

## 2023-02-25 LAB — ETHANOL: Alcohol, Ethyl (B): 316 mg/dL (ref <10)

## 2023-02-25 MED ORDER — DEXTROSE IN LACTATED RINGERS 5 % IV SOLN: INTRAVENOUS | Status: DC

## 2023-02-25 MED ORDER — POTASSIUM CHLORIDE 10 MEQ/100ML IV SOLN
10.0000 meq | INTRAVENOUS | Status: AC
  Administered 2023-02-25 – 2023-02-26 (×2): 10 meq via INTRAVENOUS
  Filled 2023-02-25 (×2): qty 100

## 2023-02-25 MED ORDER — DEXTROSE 50 % IV SOLN: 0.0000 mL | INTRAVENOUS | Status: DC | PRN

## 2023-02-25 MED ORDER — LACTATED RINGERS IV SOLN: INTRAVENOUS | Status: DC

## 2023-02-25 MED ORDER — SODIUM CHLORIDE 0.9 % IV BOLUS: 1000.0000 mL | INTRAVENOUS | Status: AC

## 2023-02-25 MED ORDER — SODIUM CHLORIDE 0.9 % IV BOLUS
1000.0000 mL | Freq: Once | INTRAVENOUS | Status: AC
  Administered 2023-02-25: 1000 mL via INTRAVENOUS

## 2023-02-25 MED ORDER — INSULIN REGULAR(HUMAN) IN NACL 100-0.9 UT/100ML-% IV SOLN
INTRAVENOUS | Status: DC
  Administered 2023-02-26: 8 [IU]/h via INTRAVENOUS
  Filled 2023-02-25: qty 100

## 2023-02-25 NOTE — Consult Note (Signed)
[facility] Consult Services   Patient Name:   Michael Hutchinson, Michael Hutchinson Date of Birth:   [birth year] Identification Number:   MRN - [id number] Date of Service:   02/25/2023 21:14:49  Diagnosis:       G45.9 - Transient cerebral ischemic attack, unspecified  Impression:      Patient presented with transient slurred speech/ left sided weakness. No thrombolytics since patient has no disabling symptoms. Would recommend ASA for secondary prevention and inpatient evaluation for stroke.  Our recommendations are outlined below.  Recommendations:        Stroke/Telemetry Floor       Neuro Checks       Bedside Swallow Eval       DVT Prophylaxis       IV Fluids, Normal Saline       Head of Bed 30 Degrees       Euglycemia and Avoid Hyperthermia (PRN Acetaminophen)       Initiate or continue Aspirin 81 MG daily  Sign Out:       Discussed with Emergency Department Provider    ------------------------------------------------------------------------------  Advanced Imaging: Advanced Imaging Deferred because:  Non-disabling symptoms as verified by the patient; no cortical signs so not consistent with LVO   Metrics: Last Known Well: 02/25/2023 19:00:00 Dispatch Time: 02/25/2023 21:14:49 Arrival Time: 02/25/2023 21:02:00 Initial Response Time: 02/25/2023 21:23:20 Symptoms: Left sided weakness. Initial patient interaction: 02/25/2023 21:34:00 NIHSS Assessment Completed: 02/25/2023 21:39:53 Patient is not a candidate for Thrombolytic. Thrombolytic Medical Decision: 02/25/2023 21:39:55 Patient was not deemed candidate for Thrombolytic because of following reasons: Stroke severity too mild (non-disabling) .  CT head showed no acute hemorrhage or acute core infarct.  Primary Provider Notified of Diagnostic Impression and Management Plan on: 02/25/2023 21:41:52    ------------------------------------------------------------------------------  History of Present  Illness: Patient is a 48 year old Male.  Patient was brought by EMS for symptoms of Left sided weakness. Past medical history of hypertension, MS, Diabetes Melliitus presented with dizziness and left sided weakness. History was provided by the patient stroke RN. Last seen normal was around 1900. It started with dizziness, slurred speech.    Past Medical History:      Hypertension      Diabetes Mellitus      Hyperlipidemia      Migraine Headaches  Medications:  No Anticoagulant use  No Antiplatelet use Reviewed EMR for current medications  Allergies:  Reviewed  Social History: Smoking: No  Family History:  There is no family history of premature cerebrovascular disease pertinent to this consultation  ROS : 14 Points Review of Systems was performed and was negative except mentioned in HPI.  Past Surgical History: There Is No Surgical History Contributory To Today's Visit     Examination: BP(143/68), Pulse(96), 1A: Level of Consciousness - Alert; keenly responsive + 0 1B: Ask Month and Age - Both Questions Right + 0 1C: Blink Eyes & Squeeze Hands - Performs Both Tasks + 0 2: Test Horizontal Extraocular Movements - Normal + 0 3: Test Visual Fields - No Visual Loss + 0 4: Test Facial Palsy (Use Grimace if Obtunded) - Normal symmetry + 0 5A: Test Left Arm Motor Drift - No Drift for 10 Seconds + 0 5B: Test Right Arm Motor Drift - No Drift for 10 Seconds + 0 6A: Test Left Leg Motor Drift - Amputation/Joint Fusion + 0 6B: Test Right Leg Motor Drift - No Drift for 5 Seconds + 0 7: Test Limb Ataxia (FNF/Heel-Shin) - No Ataxia +  0 8: Test Sensation - Normal; No sensory loss + 0 9: Test Language/Aphasia - Normal; No aphasia + 0 10: Test Dysarthria - Normal + 0 11: Test Extinction/Inattention - No abnormality + 0  NIHSS Score: 0   Pre-Morbid Modified Rankin Scale: 2 Points = Slight disability; unable to carry out all previous activities, but able to look after own affairs  without assistance  Spoke with : Dr Rosalia Hammers  This consult was conducted in real time using interactive audio and Immunologist. Patient was informed of the technology being used for this visit and agreed to proceed. Patient located in hospital and provider located at home/office setting.   Patient is being evaluated for possible acute neurologic impairment and high probability of imminent or life-threatening deterioration. I spent total of 30 minutes providing care to this patient, including time for face to face visit via telemedicine, review of medical records, imaging studies and discussion of findings with providers, the patient and/or family.   Dr Delene Ruffini Mc-ON[facility]il At[facility]   [facility] For Inpatient follow-up with [facility] physician please call [facility] at [phone]. As we are not an outpatient service for any post hospital discharge needs please contact the hospital for assistance. If you have any questions for the [facility] physicians or need to reconsult for clinical or diagnostic changes please contact us via [facility] at [phone].

## 2023-02-25 NOTE — ED Provider Notes (Signed)
[facility] Provider Note    Event Date/Time   First MD Initiated Contact with Patient 02/25/23 2104     (approximate)   History   Code Stroke   HPI  Michael Hutchinson is a 48 year old male with history of diabetes, MS, hypertension presenting to the emergency department for evaluation of slurred speech and left-sided weakness.  Last known normal 1900.  Presents as a code stroke.  Elevated blood glucose in the 400s with EMS, wife reportedly gave 12 units of insulin prior to transport.     Physical Exam   Triage Vital Signs: ED Triage Vitals  Encounter Vitals Group     BP 02/25/23 2123 (!) 113/94     Systolic BP Percentile --      Diastolic BP Percentile --      Pulse Rate 02/25/23 2123 (!) 109     Resp 02/25/23 2123 16     Temp 02/25/23 2123 99.3 F (37.4 C)     Temp Source 02/25/23 2123 Oral     SpO2 02/25/23 2123 94 %     Weight 02/25/23 2124 (!) 329 lb 2.4 oz (149.3 kg)     Height 02/25/23 2124 6' (1.829 m)     Head Circumference --      Peak Flow --      Pain Score 02/25/23 2125 6     Pain Loc --      Pain Education --      Exclude from Growth Chart --     Most recent vital signs: Vitals:   02/25/23 2123  BP: (!) 113/94  Pulse: (!) 109  Resp: 16  Temp: 99.3 F (37.4 C)  SpO2: 94%     General: Awake, interactive  CV:  Tachycardic, good peripheral perfusion Resp:  Unlabored respirations. Abd:  Nondistended, soft, nontender to palpation Neuro:  Keenly aware, correctly answers month and age, able to blink eyes and squeeze hands, normal horizontal extraocular movements, no visual field loss, normal facial symmetry, no arm or leg motor drift, no limb ataxia, subjective diminished sensation over the left side, no aphasia, mild dysarthria, no inattention    ED Results / Procedures / Treatments   Labs (all labs ordered are listed, but only abnormal results are displayed) Labs Reviewed  CBC - Abnormal; Notable for the following  components:      Result Value   HCT 38.9 (*)    All other components within normal limits  DIFFERENTIAL - Abnormal; Notable for the following components:   Monocytes Absolute 1.1 (*)    All other components within normal limits  COMPREHENSIVE METABOLIC PANEL - Abnormal; Notable for the following components:   Sodium 134 (*)    Chloride 96 (*)    CO2 21 (*)    Glucose, Bld 335 (*)    Calcium 8.8 (*)    AST 14 (*)    Anion gap 17 (*)    All other components within normal limits  ETHANOL - Abnormal; Notable for the following components:   Alcohol, Ethyl (B) 316 (*)    All other components within normal limits  CBG MONITORING, ED - Abnormal; Notable for the following components:   Glucose-Capillary 332 (*)    All other components within normal limits  PROTIME-INR  APTT  URINE DRUG SCREEN, QUALITATIVE ([facility] ONLY)  URINALYSIS, ROUTINE W REFLEX MICROSCOPIC  BETA-HYDROXYBUTYRIC ACID  BETA-HYDROXYBUTYRIC ACID  BETA-HYDROXYBUTYRIC ACID  BETA-HYDROXYBUTYRIC ACID  BLOOD GAS, VENOUS  CBG MONITORING, ED  EKG EKG independently reviewed interpreted by myself (ER attending) demonstrates:  EKG demonstrate sinus tachycardia at a rate of 101, PR 157, QRS 99, QTc 449, no acute ST changes  RADIOLOGY Imaging independently reviewed and interpreted by myself demonstrates:  CT head without acute bleed or other acute findings  PROCEDURES:  Critical Care performed: Yes, see critical care procedure note(s)  CRITICAL CARE Performed by: Trinna Post   Total critical care time: 32 minutes  Critical care time was exclusive of separately billable procedures and treating other patients.  Critical care was necessary to treat or prevent imminent or life-threatening deterioration.  Critical care was time spent personally by me on the following activities: development of treatment plan with patient and/or surrogate as well as nursing, discussions with consultants, evaluation of patient's response to  treatment, examination of patient, obtaining history from patient or surrogate, ordering and performing treatments and interventions, ordering and review of laboratory studies, ordering and review of radiographic studies, pulse oximetry and re-evaluation of patient's condition.   Procedures   MEDICATIONS ORDERED IN ED: Medications  sodium chloride 0.9 % bolus 1,000 mL (has no administration in time range)  insulin regular, human (MYXREDLIN) 100 units/ 100 mL infusion (has no administration in time range)  lactated ringers infusion (has no administration in time range)  dextrose 5 % in lactated ringers infusion (has no administration in time range)  dextrose 50 % solution 0-50 mL (has no administration in time range)  sodium chloride 0.9 % bolus 1,000 mL (has no administration in time range)  potassium chloride 10 mEq in 100 mL IVPB (has no administration in time range)     IMPRESSION / MDM / ASSESSMENT AND PLAN / ED COURSE  I reviewed the triage vital signs and the nursing notes.  Differential diagnosis includes, but is not limited to, CVA, TIA, MS flare, anemia, electrolyte abnormality, DKA, drug intoxication or withdrawal  Patient's presentation is most consistent with acute presentation with potential threat to life or bodily function.  48 year old male presenting to the emergency department for evaluation of slurred speech and left-sided weakness.  Tachycardic on presentation.  Evaluated as a code stroke.  CT head without acute findings.  Case reviewed with Dr. Alva Garnet with teleneurology team.  Recommends admission for further TIA evaluation.  Labs with borderline DKA with glucose in the 300s, slightly high anion gap at 17, but minimally low bicarb at 21. Ethanol level also returned significantly elevated at 316, could be contributing to patient's symptoms.  Will discuss case with hospitalist team.  Case discussed with hospitalist team.  They did recommend initiation of insulin drip  for borderline DKA which has been ordered.  They will evaluate patient for anticipated admission.      FINAL CLINICAL IMPRESSION(S) / ED DIAGNOSES   Final diagnoses:  Slurred speech  Left-sided weakness  Diabetic ketoacidosis without coma associated with type 2 diabetes mellitus (HCC)  Alcoholic intoxication without complication (HCC)     Rx / DC Orders   ED Discharge Orders     None        Note:  This document was prepared using Dragon voice recognition software and may include unintentional dictation errors.   Trinna Post, MD 02/25/23 [phone]

## 2023-02-25 NOTE — ED Notes (Signed)
Patient placed on 2L [place] for O2 sats of 80% with appropriate pleth noted on monitor.

## 2023-02-25 NOTE — Progress Notes (Signed)
  Code Stroke 2113: Code stroke cart activated at this time. Pt with dizziness, slurred speech left sided weakness, and diminished sensation to the left. Running code stroke per EDP at this time. Requested that staff pre-pull TNK at this time: NO No weakness or slurred speech observed by TSRN.   Patient already in CT upon activation.  2114: TSP paged at this time.  2119: Patient returned from CT at this time.  2139: Dr. Randell Loop on camera at this time. Patient report and history provided to TSP at this time.  2136: NCCT imaging results provided to TSP on camera.  2133: Dr. Randell Loop performing neuro assessment at this time.

## 2023-02-25 NOTE — ED Triage Notes (Signed)
Patient presents to ED via [facility] for stroke like symptoms. Reporting L side paraesthesia/weakness and slurred speech. Pt wife reports symptom onset around 81.  Also reports pt CBG 480, given 12u insulin PTA.  Reports abscess to R side tooth.   Pmhx DM and MS.

## 2023-02-26 ENCOUNTER — Observation Stay
Admit: 2023-02-26 | Discharge: 2023-02-26 | Disposition: A | Discharge status: Home / Self Care | Payer: Medicaid Other | Attending: Internal Medicine | Admitting: Internal Medicine

## 2023-02-26 ENCOUNTER — Encounter: Payer: Self-pay | Admitting: Internal Medicine

## 2023-02-26 ENCOUNTER — Observation Stay: Payer: Medicaid Other

## 2023-02-26 DIAGNOSIS — R4781 Slurred speech: Principal | ICD-10-CM | POA: Insufficient documentation

## 2023-02-26 DIAGNOSIS — G459 Transient cerebral ischemic attack, unspecified: Secondary | ICD-10-CM | POA: Diagnosis not present

## 2023-02-26 LAB — BETA-HYDROXYBUTYRIC ACID
Beta-Hydroxybutyric Acid: 0.15 mmol/L (ref 0.05–0.27)
Beta-Hydroxybutyric Acid: 0.19 mmol/L (ref 0.05–0.27)
Beta-Hydroxybutyric Acid: 0.23 mmol/L (ref 0.05–0.27)

## 2023-02-26 LAB — URINALYSIS, ROUTINE W REFLEX MICROSCOPIC
Bacteria, UA: NONE SEEN
Bilirubin Urine: NEGATIVE
Glucose, UA: >=500 mg/dL — AB
Hgb urine dipstick: NEGATIVE
Ketones, ur: NEGATIVE mg/dL
Leukocytes,Ua: NEGATIVE
Nitrite: NEGATIVE
Protein, ur: NEGATIVE mg/dL
RBC / HPF: 0 RBC/hpf (ref 0–5)
Specific Gravity, Urine: 1.01 (ref 1.005–1.030)
Squamous Epithelial / HPF: 0 /[HPF] (ref 0–5)
pH: 6 (ref 5.0–8.0)

## 2023-02-26 LAB — CBG MONITORING, ED
Glucose-Capillary: 104 mg/dL — ABNORMAL HIGH (ref 70–99)
Glucose-Capillary: 118 mg/dL — ABNORMAL HIGH (ref 70–99)
Glucose-Capillary: 128 mg/dL — ABNORMAL HIGH (ref 70–99)
Glucose-Capillary: 166 mg/dL — ABNORMAL HIGH (ref 70–99)
Glucose-Capillary: 188 mg/dL — ABNORMAL HIGH (ref 70–99)
Glucose-Capillary: 192 mg/dL — ABNORMAL HIGH (ref 70–99)
Glucose-Capillary: 195 mg/dL — ABNORMAL HIGH (ref 70–99)

## 2023-02-26 LAB — BASIC METABOLIC PANEL
Anion gap: 12 (ref 5–15)
Anion gap: 14 (ref 5–15)
BUN: 13 mg/dL (ref 6–20)
BUN: 12 mg/dL (ref 6–20)
CO2: 22 mmol/L (ref 22–32)
CO2: 22 mmol/L (ref 22–32)
Calcium: 8.6 mg/dL — ABNORMAL LOW (ref 8.9–10.3)
Calcium: 8.4 mg/dL — ABNORMAL LOW (ref 8.9–10.3)
Chloride: 102 mmol/L (ref 98–111)
Chloride: 100 mmol/L (ref 98–111)
Creatinine, Ser: 0.8 mg/dL (ref 0.61–1.24)
Creatinine, Ser: 0.86 mg/dL (ref 0.61–1.24)
GFR, Estimated: >60 mL/min (ref >60)
GFR, Estimated: >60 mL/min (ref >60)
Glucose, Bld: 130 mg/dL — ABNORMAL HIGH (ref 70–99)
Glucose, Bld: 150 mg/dL — ABNORMAL HIGH (ref 70–99)
Potassium: 3.9 mmol/L (ref 3.5–5.1)
Potassium: 4.2 mmol/L (ref 3.5–5.1)
Sodium: 136 mmol/L (ref 135–145)
Sodium: 136 mmol/L (ref 135–145)

## 2023-02-26 LAB — BLOOD GAS, VENOUS
Acid-Base Excess: 1 mmol/L (ref 0.0–2.0)
Bicarbonate: 26 mmol/L (ref 20.0–28.0)
O2 Saturation: 78.2 %
Patient temperature: 37
pCO2, Ven: 42 mm[Hg] — ABNORMAL LOW (ref 44–60)
pH, Ven: 7.4 (ref 7.25–7.43)
pO2, Ven: 43 mm[Hg] (ref 32–45)

## 2023-02-26 LAB — HEMOGLOBIN A1C
Hgb A1c MFr Bld: 7 % — ABNORMAL HIGH (ref 4.8–5.6)
Mean Plasma Glucose: 154.2 mg/dL

## 2023-02-26 LAB — LIPID PANEL
Cholesterol: 189 mg/dL (ref 0–200)
HDL: 55 mg/dL (ref >40)
LDL Cholesterol: 97 mg/dL (ref 0–99)
Total CHOL/HDL Ratio: 3.4 {ratio}
Triglycerides: 187 mg/dL — ABNORMAL HIGH (ref <150)
VLDL: 37 mg/dL (ref 0–40)

## 2023-02-26 LAB — URINE DRUG SCREEN, QUALITATIVE (ARMC ONLY)
Amphetamines, Ur Screen: NOT DETECTED
Barbiturates, Ur Screen: NOT DETECTED
Benzodiazepine, Ur Scrn: NOT DETECTED
Cannabinoid 50 Ng, Ur ~~LOC~~: NOT DETECTED
Cocaine Metabolite,Ur ~~LOC~~: NOT DETECTED
MDMA (Ecstasy)Ur Screen: NOT DETECTED
Methadone Scn, Ur: NOT DETECTED
Opiate, Ur Screen: POSITIVE — AB
Phencyclidine (PCP) Ur S: NOT DETECTED
Tricyclic, Ur Screen: NOT DETECTED

## 2023-02-26 LAB — MAGNESIUM: Magnesium: 1.9 mg/dL (ref 1.7–2.4)

## 2023-02-26 LAB — PHOSPHORUS: Phosphorus: 1.8 mg/dL — ABNORMAL LOW (ref 2.5–4.6)

## 2023-02-26 LAB — TSH: TSH: 1.694 u[IU]/mL (ref 0.350–4.500)

## 2023-02-26 MED ORDER — LORAZEPAM 1 MG PO TABS: 1.0000 mg | ORAL_TABLET | ORAL | Status: DC | PRN

## 2023-02-26 MED ORDER — ASPIRIN 81 MG PO TBEC
81.0000 mg | DELAYED_RELEASE_TABLET | Freq: Every day | ORAL | Status: DC
  Administered 2023-02-26: 81 mg via ORAL
  Filled 2023-02-26: qty 1

## 2023-02-26 MED ORDER — PANTOPRAZOLE SODIUM 40 MG PO TBEC: 40.0000 mg | DELAYED_RELEASE_TABLET | Freq: Every day | ORAL | 0 refills | Status: DC

## 2023-02-26 MED ORDER — THIAMINE MONONITRATE 100 MG PO TABS
100.0000 mg | ORAL_TABLET | Freq: Every day | ORAL | Status: DC
  Administered 2023-02-26: 100 mg via ORAL
  Filled 2023-02-26: qty 1

## 2023-02-26 MED ORDER — DIAZEPAM 5 MG PO TABS
10.0000 mg | ORAL_TABLET | ORAL | Status: AC
  Administered 2023-02-26: 10 mg via ORAL
  Filled 2023-02-26: qty 2

## 2023-02-26 MED ORDER — SODIUM CHLORIDE 0.9 % IV SOLN: INTRAVENOUS | Status: DC

## 2023-02-26 MED ORDER — MORPHINE SULFATE (PF) 4 MG/ML IV SOLN
4.0000 mg | Freq: Once | INTRAVENOUS | Status: AC
  Administered 2023-02-26: 4 mg via INTRAVENOUS
  Filled 2023-02-26: qty 1

## 2023-02-26 MED ORDER — CLOPIDOGREL BISULFATE 75 MG PO TABS: 75.0000 mg | ORAL_TABLET | Freq: Every day | ORAL | 11 refills | Status: DC

## 2023-02-26 MED ORDER — EZETIMIBE 10 MG PO TABS
10.0000 mg | ORAL_TABLET | Freq: Every day | ORAL | Status: DC
  Administered 2023-02-26: 10 mg via ORAL
  Filled 2023-02-26: qty 1

## 2023-02-26 MED ORDER — THIAMINE HCL 100 MG/ML IJ SOLN: 100.0000 mg | Freq: Every day | INTRAMUSCULAR | Status: DC

## 2023-02-26 MED ORDER — MORPHINE SULFATE (PF) 2 MG/ML IV SOLN
2.0000 mg | Freq: Once | INTRAVENOUS | Status: AC
  Administered 2023-02-26: 2 mg via INTRAVENOUS
  Filled 2023-02-26: qty 1

## 2023-02-26 MED ORDER — FLUTICASONE PROPIONATE 50 MCG/ACT NA SUSP: 2.0000 | Freq: Every day | NASAL | Status: DC | PRN

## 2023-02-26 MED ORDER — INSULIN GLARGINE-YFGN 100 UNIT/ML ~~LOC~~ SOLN
20.0000 [IU] | Freq: Every day | SUBCUTANEOUS | Status: DC
  Administered 2023-02-26: 20 [IU] via SUBCUTANEOUS
  Filled 2023-02-26: qty 0.2

## 2023-02-26 MED ORDER — INSULIN GLARGINE-YFGN 100 UNIT/ML ~~LOC~~ SOLN: 20.0000 [IU] | Freq: Every day | SUBCUTANEOUS | Status: DC

## 2023-02-26 MED ORDER — ACETAMINOPHEN 325 MG RE SUPP: 650.0000 mg | RECTAL | Status: DC | PRN

## 2023-02-26 MED ORDER — ACETAMINOPHEN 160 MG/5ML PO SOLN: 650.0000 mg | ORAL | Status: DC | PRN

## 2023-02-26 MED ORDER — FOLIC ACID 1 MG PO TABS
1.0000 mg | ORAL_TABLET | Freq: Every day | ORAL | Status: DC
  Administered 2023-02-26: 1 mg via ORAL
  Filled 2023-02-26: qty 1

## 2023-02-26 MED ORDER — LORAZEPAM 2 MG/ML IJ SOLN: 1.0000 mg | INTRAMUSCULAR | Status: DC | PRN

## 2023-02-26 MED ORDER — SUMATRIPTAN SUCCINATE 50 MG PO TABS: 100.0000 mg | ORAL_TABLET | Freq: Once | ORAL | Status: DC | PRN

## 2023-02-26 MED ORDER — ALBUTEROL SULFATE (2.5 MG/3ML) 0.083% IN NEBU
2.5000 mg | INHALATION_SOLUTION | Freq: Four times a day (QID) | RESPIRATORY_TRACT | Status: DC | PRN
Start: 2023-02-26 — End: 2023-02-26

## 2023-02-26 MED ORDER — ASPIRIN 81 MG PO TBEC: 81.0000 mg | DELAYED_RELEASE_TABLET | Freq: Every day | ORAL | 0 refills | Status: DC

## 2023-02-26 MED ORDER — LOSARTAN POTASSIUM 50 MG PO TABS
50.0000 mg | ORAL_TABLET | Freq: Every day | ORAL | Status: DC
  Administered 2023-02-26: 50 mg via ORAL
  Filled 2023-02-26: qty 1

## 2023-02-26 MED ORDER — INSULIN ASPART 100 UNIT/ML IJ SOLN: 0.0000 [IU] | Freq: Three times a day (TID) | INTRAMUSCULAR | Status: DC

## 2023-02-26 MED ORDER — ONDANSETRON HCL 4 MG/2ML IJ SOLN
4.0000 mg | INTRAMUSCULAR | Status: AC
  Administered 2023-02-26: 4 mg via INTRAVENOUS
  Filled 2023-02-26: qty 2

## 2023-02-26 MED ORDER — STROKE: EARLY STAGES OF RECOVERY BOOK: Freq: Once | Status: DC

## 2023-02-26 MED ORDER — CLOPIDOGREL BISULFATE 75 MG PO TABS
75.0000 mg | ORAL_TABLET | Freq: Every day | ORAL | Status: DC
  Administered 2023-02-26: 75 mg via ORAL
  Filled 2023-02-26: qty 1

## 2023-02-26 MED ORDER — SENNOSIDES-DOCUSATE SODIUM 8.6-50 MG PO TABS: 1.0000 | ORAL_TABLET | Freq: Every evening | ORAL | Status: DC | PRN

## 2023-02-26 MED ORDER — HYDROCODONE-ACETAMINOPHEN 5-325 MG PO TABS
1.0000 | ORAL_TABLET | ORAL | Status: DC | PRN
  Administered 2023-02-26: 1 via ORAL
  Filled 2023-02-26: qty 1

## 2023-02-26 MED ORDER — PANTOPRAZOLE SODIUM 40 MG PO TBEC
40.0000 mg | DELAYED_RELEASE_TABLET | Freq: Every day | ORAL | Status: DC
  Administered 2023-02-26: 40 mg via ORAL
  Filled 2023-02-26: qty 1

## 2023-02-26 MED ORDER — INSULIN ASPART 100 UNIT/ML IJ SOLN
0.0000 [IU] | Freq: Three times a day (TID) | INTRAMUSCULAR | Status: DC
  Administered 2023-02-26 (×2): 4 [IU] via SUBCUTANEOUS
  Filled 2023-02-26 (×2): qty 1

## 2023-02-26 MED ORDER — ONDANSETRON 4 MG PO TBDP: 4.0000 mg | ORAL_TABLET | Freq: Three times a day (TID) | ORAL | Status: DC | PRN

## 2023-02-26 MED ORDER — ADULT MULTIVITAMIN W/MINERALS CH
1.0000 | ORAL_TABLET | Freq: Every day | ORAL | Status: DC
  Administered 2023-02-26: 1 via ORAL
  Filled 2023-02-26: qty 1

## 2023-02-26 MED ORDER — ENOXAPARIN SODIUM 80 MG/0.8ML IJ SOSY
0.5000 mg/kg | PREFILLED_SYRINGE | INTRAMUSCULAR | Status: DC
  Administered 2023-02-26: 75 mg via SUBCUTANEOUS
  Filled 2023-02-26: qty 0.75

## 2023-02-26 MED ORDER — METOPROLOL SUCCINATE ER 50 MG PO TB24
100.0000 mg | ORAL_TABLET | Freq: Every day | ORAL | Status: DC
  Administered 2023-02-26: 100 mg via ORAL
  Filled 2023-02-26: qty 2

## 2023-02-26 MED ORDER — AMLODIPINE BESYLATE 5 MG PO TABS
5.0000 mg | ORAL_TABLET | Freq: Every day | ORAL | Status: DC
  Administered 2023-02-26: 5 mg via ORAL
  Filled 2023-02-26: qty 1

## 2023-02-26 MED ORDER — ACETAMINOPHEN 325 MG PO TABS: 650.0000 mg | ORAL_TABLET | ORAL | Status: DC | PRN

## 2023-02-26 NOTE — Progress Notes (Signed)
SLP Cancellation Note  Patient Details Name: Michael Hutchinson MRN: [id number] DOB: [birth year]   Cancelled treatment:       Reason Eval/Treat Not Completed: SLP screened, no needs identified, will sign off (chart reviewed; consulted NSG then met w/ pt/Wife.)  Pt denied any difficulty swallowing and is currently on a regular diet; tolerates swallowing pills w/ water per NSG. Had finished eating his breakfast meal stating "no problems swallowing". Pt conversed in conversation w/out expressive/receptive deficits noted; pt denied any speech-language deficits. Speech clear.  No further skilled ST services indicated as pt appears at his baseline. Pt agreed. NSG to reconsult if any change in status while admitted.      Jerilynn Som, MS, CCC-SLP Speech Language Pathologist Rehab Services; [facility] [phone] (ascom) Kaylin Marcon 02/26/2023, 9:40 AM

## 2023-02-26 NOTE — Assessment & Plan Note (Signed)
Patient with suboptimally controlled DM presents with serum glucose of 373, AG 17 CO2 21 - borderline for DKA in a setting of TIA  Plan Insulin infusion/DKA protocol initiated.  Once patient is stable will resume home regimen of basal insulin and sliding scale.

## 2023-02-26 NOTE — Assessment & Plan Note (Signed)
BP well controlled  Plan Continue home regimen

## 2023-02-26 NOTE — Subjective & Objective (Signed)
Mr. 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 On the evening of 02/25/23 he had an episode of slurred speech and left sided weakness observed by his wife. Due to his symptoms he presented to [facility]ED for evaluation.

## 2023-02-26 NOTE — Assessment & Plan Note (Signed)
Once DKA issue resolved resume home regimen

## 2023-02-26 NOTE — H&P (Signed)
History and Physical    Michael Hutchinson ZOX:[id number] DOB: 07-Aug-1975 DOA: 02/25/2023  DOS: the patient was seen and examined on 02/25/2023  PCP: Melida Quitter, PA   Patient coming from: Home  I have personally briefly reviewed patient's old medical records in [facility]  Michael Hutchinson, [name] with previous episode DKA, GERD, multiple ortho problems, s/p left BKA 2/2 osteomyelitis, MS recently diagnosed, HTN, HLD, morbid obesity. On the evening of 02/25/23 he had an episode of slurred speech and left sided weakness observed by his wife. Due to his symptoms he presented to [facility]ED for evaluation.    ED Course: T 99.3  113/94  HR 109  RR 16. On exam awake alert and cooperative, in no distress. Lab: K 3.8, AG 17, Cholride 21, Cr 1.67, Glucose 335, CBCD nl, EtOH 316. In ED CT head - negative for acute findings. Tele neuro consult - Dr. Plantar - recommends TIA workup as inpatient. TRH called to admit for completion of neuro workup and for management of borderline DKA.  Review of Systems:  Review of Systems  Constitutional: Negative.   HENT: Negative.    Eyes: Negative.   Cardiovascular: Negative.   Gastrointestinal: Negative.   Genitourinary: Negative.   Musculoskeletal: Negative.   Skin: Negative.   Neurological:  Positive for speech change and focal weakness.  Psychiatric/Behavioral: Negative.      Past Medical History:  Diagnosis Date   Charcot's joint of foot 11/25/2013   Complication of anesthesia    "I wake up angry" (12/31/2015)   Diabetes mellitus type 1 (HCC) dx'd 1981   Diabetic ketoacidosis (HCC)    Essential hypertension 05/03/2013   GERD (gastroesophageal reflux disease)    High cholesterol    Hx MRSA infection    Inner thigh and under arm- healed areas   Meniscus tear     Past Surgical History:  Procedure Laterality Date   ABDOMINAL SURGERY     AMPUTATION Left 01/01/2016   Procedure: AMPUTATION BELOW KNEE;  Surgeon: Toni Arthurs, MD;   Location: [facility] OR;  Service: Orthopedics;  Laterality: Left;   APPLICATION OF WOUND VAC  04/26/2016   CARPAL TUNNEL WITH CUBITAL TUNNEL Right 10/22/2019   Procedure: Right carpal tunnel release, right cubital tunnel release in situ, Right pronator/proximal median nerve release, Right elbow ECRB debridement with partial ostectomy and repair as necessary;  Surgeon: Dominica Severin, MD;  Location: [facility] OR;  Service: Orthopedics;  Laterality: Right;    GASTRIC ROUX-EN-Y N/A 12/14/2020   Procedure: LAPAROSCOPIC ROUX-EN-Y GASTRIC BYPASS WITH UPPER ENDOSCOPY;  Surgeon: Luretha Murphy, MD;  Location: [facility] ORS;  Service: General;  Laterality: N/A;   HERNIA REPAIR     I & D EXTREMITY Left 04/26/2016   Procedure: IRRIGATION AND DEBRIDEMENT EXTREMITY/Left Leg/Possible Wound Vac;  Surgeon: Toni Arthurs, MD;  Location: [facility] OR;  Service: Orthopedics;  Laterality: Left;   INCISE AND DRAIN ABCESS Left 04/26/2016   KNEE ARTHROSCOPY Left ~ 2010   LAPAROSCOPIC CHOLECYSTECTOMY  2015   METACARPOPHALANGEAL JOINT ARTHRODESIS Left 06/2012   Fracture left index finger intra-articular MCP joint/notes 06/30/2012   OPEN REDUCTION INTERNAL FIXATION (ORIF) PROXIMAL PHALANX Left 06/30/2012   Procedure: OPEN REDUCTION INTERNAL FIXATION (ORIF) LEFT INDEX FINGER PROXIMAL PHALANX FRACTURE WITH LIGAMENT REPAIR AS NECESSARY;  Surgeon: Dominica Severin, MD;  Location: [facility] OR;  Service: Orthopedics;  Laterality: Left;   UMBILICAL HERNIA REPAIR  2015   "w/gallbladder OR"   VENTRAL HERNIA REPAIR N/A 11/29/2021  Procedure: LAPAROSCOPIC VENTRAL HERNIA REPAIR WITH MESH;  Surgeon: Luretha Murphy, MD;  Location: [facility] ORS;  Service: General;  Laterality: N/A;    Soc 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 He has never been exposed to tobacco smoke. He quit smokeless tobacco use about 12 years ago.  His smokeless tobacco use included snuff and chew. He reports current alcohol use of about 12.0 standard  drinks of alcohol per week. He reports that he does not use drugs.  Allergies  Allergen Reactions   Influenza Vac Split Quad Nausea And Vomiting   Influenza Virus Vaccine Split Nausea And Vomiting   Statins Other (See Comments)    Myalgias   Hydrochlorothiazide Other (See Comments)    Dizziness   Lisinopril Cough    Family History  Problem Relation Age of Onset   Other Mother    Cancer Mother        Breast / Bone   Heart attack Father    Hypertension Father    Hyperlipidemia Father    Diabetes Other    Alcohol abuse Sister    Diabetes Maternal Grandfather    Stroke Paternal Grandmother    Alcohol abuse Paternal Grandfather     Prior to Admission medications   Medication Sig Start Date End Date Taking? Authorizing Provider  acetone, urine, test strip 1 strip by Does not apply route as needed for high blood sugar. 01/06/23   Darlin Priestly, MD  albuterol (VENTOLIN HFA) 108 (90 Base) MCG/ACT inhaler Inhale 2 puffs into the lungs every 6 (six) hours as needed for wheezing or shortness of breath. 12/12/22   Jene Every, MD  amLODipine (NORVASC) 5 MG tablet Take 1 tablet (5 mg total) by mouth daily. 10/04/22   Melida Quitter, PA  Blood Pressure Monitoring (BLOOD PRESSURE MONITOR/L CUFF) MISC Use blood pressure cuff and monitor to check blood pressure at home at least several times per week if not daily. 04/04/22   Melida Quitter, PA  cetirizine (ZYRTEC ALLERGY) 10 MG tablet Take 1 tablet (10 mg total) by mouth daily as needed for allergies. Home med. 01/06/23 02/21/23  Darlin Priestly, MD  Continuous Blood Gluc Receiver (DEXCOM G6 RECEIVER) DEVI See admin instructions. 04/28/21   [provider]  Continuous Blood Gluc Sensor (DEXCOM G6 SENSOR) MISC SMARTSIG:1 Topical Every 10 Days 04/27/21   [provider]  Continuous Blood Gluc Transmit (DEXCOM G6 TRANSMITTER) MISC DISPENSE AND USE AS DIRECTED 04/27/21   [provider]  diazepam (VALIUM) 10 MG tablet Take 1 tablet  30 to 40 minutes prior to MRI.  May take 1 tablet just before MRI if needed. 02/21/23   Drema Dallas, DO  ezetimibe (ZETIA) 10 MG tablet Take 1 tablet (10 mg total) by mouth daily. 10/04/22   Melida Quitter, PA  fluticasone (FLONASE) 50 MCG/ACT nasal spray Place 2 sprays into both nostrils daily as needed for allergies or rhinitis. 01/06/23   Darlin Priestly, MD  glucose blood (BAYER CONTOUR NEXT TEST) test strip 1 each by Other route 4 (four) times daily. And lancets 4/day 10/19/16   Romero Belling, MD  [name] 200 UNIT/ML KwikPen 1 unit per 12 grams of carbs, and Novolog 1 unit per every 50 mg/dl above target glucose.  While off insulin pump. 01/06/23   Darlin Priestly, MD  HYDROcodone-acetaminophen (NORCO/VICODIN) 5-325 MG tablet Take 1 tablet by mouth every 4 (four) hours as needed. 11/29/21   Luretha Murphy, MD  Insulin Disposable Pump (OMNIPOD 5 G6 PODS, GEN 5,) MISC Inject 1 Device into the skin daily. 04/13/22   [provider]  insulin glargine (LANTUS SOLOSTAR) 100 UNIT/ML Solostar Pen Inject 20 Units into the skin daily. While off insulin pump. 01/06/23   Darlin Priestly, MD  Insulin Pen Needle (PEN NEEDLES) 32G X 4 MM MISC USE 5 TIMES DAILY AS DIRECTED 07/07/22   [provider]  losartan (COZAAR) 50 MG tablet TAKE 1 TABLET BY MOUTH ONCE DAILY 10/04/22   Saralyn Pilar A, PA  metoprolol succinate (TOPROL-XL) 100 MG 24 hr tablet Take 1 tablet (100 mg total) by mouth daily. TAKE WITH OR IMMEDIATELY FOLLOWING A MEAL. 10/04/22   Melida Quitter, PA  Multiple Vitamins-Minerals (MULTIVITAMIN WITH MINERALS) tablet Take 1 tablet by mouth daily.    [provider]  OCREVUS 300 MG/10ML injection Inject 300 mg into the vein every 6 (six) months. 09/01/22   [provider]  ondansetron (ZOFRAN-ODT) 4 MG disintegrating tablet Take 1 tablet (4 mg total) by mouth every 8 (eight) hours as needed for nausea or vomiting. 01/01/23   Chesley Noon, MD  rizatriptan (MAXALT-MLT) 10 MG  disintegrating tablet Take 1 tablet earliest onset of headache.  May repeat after 2 hours if needed. Maximum 2 tablets in 24 hours 02/10/21   Drema Dallas, DO    Physical Exam: Vitals:   02/25/23 2123 02/25/23 2124  BP: (!) 113/94   Pulse: (!) 109   Resp: 16   Temp: 99.3 F (37.4 C)   TempSrc: Oral   SpO2: 94%   Weight:  (!) 149.3 kg  Height:  6' (1.829 m)    Physical Exam Vitals and nursing note reviewed.  Constitutional:      General: He is not in acute distress.    Appearance: Normal appearance. He is obese. He is not ill-appearing.  HENT:     Head: Atraumatic.     Mouth/Throat:     Mouth: Mucous membranes are moist.     Pharynx: Oropharynx is clear. No oropharyngeal exudate.  Eyes:     Extraocular Movements: Extraocular movements intact.     Conjunctiva/sclera: Conjunctivae normal.     Pupils: Pupils are equal, round, and reactive to light.  Cardiovascular:     Rate and Rhythm: Regular rhythm. Tachycardia present.     Pulses: Normal pulses.     Heart sounds: Normal heart sounds.  Pulmonary:     Effort: Pulmonary effort is normal.     Breath sounds: Normal breath sounds.  Abdominal:     General: Bowel sounds are normal.     Palpations: Abdomen is soft.  Musculoskeletal:        General: Normal range of motion.     Cervical back: Normal range of motion and neck supple.     Right lower leg: No edema.     Left lower leg: No edema.     Comments: Left BKA  Skin:    General: Skin is warm and dry.  Neurological:     General: No focal deficit present.     Mental Status: He is alert and oriented to person, place, and time.     Cranial Nerves: No cranial nerve deficit.     Motor: No weakness.  Psychiatric:        Mood and Affect: Mood normal.        Behavior: Behavior normal.      Labs on Admission: I have personally reviewed following labs and imaging  studies  CBC: Recent Labs  Lab 02/25/23 2135  WBC 9.3  NEUTROABS 7.0  HGB 13.3  HCT 38.9*  MCV 91.3  PLT  285   Basic Metabolic Panel: Recent Labs  Lab 02/25/23 2135  NA 134*  K 3.8  CL 96*  CO2 21*  GLUCOSE 335*  BUN 15  CREATININE 1.07  CALCIUM 8.8*   GFR: Estimated Creatinine Clearance: 131.1 mL/min (by C-G formula based on SCr of 1.07 mg/dL). Liver Function Tests: Recent Labs  Lab 02/25/23 2135  AST 14*  ALT 15  ALKPHOS 107  BILITOT 0.7  PROT 7.2  ALBUMIN 3.8   No results for input(s): "LIPASE", "AMYLASE" in the last 168 hours. No results for input(s): "AMMONIA" in the last 168 hours. Coagulation Profile: Recent Labs  Lab 02/25/23 2135  INR 1.0   Cardiac Enzymes: No results for input(s): "CKTOTAL", "CKMB", "CKMBINDEX", "TROPONINI" in the last 168 hours. BNP (last 3 results) No results for input(s): "PROBNP" in the last 8760 hours. HbA1C: No results for input(s): "HGBA1C" in the last 72 hours. CBG: Recent Labs  Lab 02/25/23 2108 02/26/23 0024 02/26/23 0123  GLUCAP 332* 195* 166*   Lipid Profile: No results for input(s): "CHOL", "HDL", "LDLCALC", "TRIG", "CHOLHDL", "LDLDIRECT" in the last 72 hours. Thyroid Function Tests: No results for input(s): "TSH", "T4TOTAL", "FREET4", "T3FREE", "THYROIDAB" in the last 72 hours. Anemia Panel: No results for input(s): "VITAMINB12", "FOLATE", "FERRITIN", "TIBC", "IRON", "RETICCTPCT" in the last 72 hours. Urine analysis:    Component Value Date/Time   COLORURINE YELLOW (A) 02/26/2023 0004   APPEARANCEUR CLEAR (A) 02/26/2023 0004   LABSPEC 1.010 02/26/2023 0004   PHURINE 6.0 02/26/2023 0004   GLUCOSEU >=500 (A) 02/26/2023 0004   HGBUR NEGATIVE 02/26/2023 0004   BILIRUBINUR NEGATIVE 02/26/2023 0004   BILIRUBINUR negative 07/17/2017 1436   KETONESUR NEGATIVE 02/26/2023 0004   PROTEINUR NEGATIVE 02/26/2023 0004   UROBILINOGEN negative (A) 07/17/2017 1436   UROBILINOGEN 2.0 (H) 12/10/2011 1800   NITRITE NEGATIVE 02/26/2023 0004   LEUKOCYTESUR NEGATIVE 02/26/2023 0004    Radiological Exams on Admission: I have  personally reviewed images CT HEAD CODE STROKE WO CONTRAST Result Date: 02/25/2023 CLINICAL DATA:  Code stroke.  Neuro deficit, acute, stroke suspected EXAM: CT HEAD WITHOUT CONTRAST TECHNIQUE: Contiguous axial images were obtained from the base of the skull through the vertex without intravenous contrast. RADIATION DOSE REDUCTION: This exam was performed according to the departmental dose-optimization program which includes automated exposure control, adjustment of the mA and/or kV according to patient size and/or use of iterative reconstruction technique. COMPARISON:  None Available. FINDINGS: Brain: No evidence of acute infarction, hemorrhage, hydrocephalus, extra-axial collection or mass lesion/mass effect. Chronic microvascular ischemic disease and remote appearing corona radiata lacunar infarcts. Vascular: No hyperdense vessel. Skull: No acute fracture. Sinuses/Orbits: Clear sinuses. Calcifications and vitreous hyperdensities in the left globe with small left globe. Other: No mastoid effusions. ASPECTS [facility] Stroke Program Early CT Score) Total score (0-10 with 10 being normal): 10. IMPRESSION: 1. No evidence of acute intracranial abnormality. ASPECTS is 10. 2. Chronic microvascular ischemic disease and remote appearing corona radiata lacunar infarcts. 3. Left globe calcifications and vitreous hyperdensities, presumably the sequela of prior insult. Correlate with ophthalmologic history and funduscopic exam if clinically warranted. Code stroke imaging results were communicated on 02/25/2023 at 9:27 pm to provider Ray via telephone, who verbally acknowledged these results. Electronically Signed   By: Feliberto Harts M.D.   On: 02/25/2023 21:30    EKG: I have personally reviewed  EKG: sinus tachycardia  Assessment/Plan Active Problems:   Chronic pain syndrome   DKA (diabetic ketoacidosis) (HCC)   GERD (gastroesophageal reflux disease)   Hypertension associated with diabetes (HCC)   Type 1 diabetes  mellitus with hyperglycemia (HCC)   Slurred speech    Assessment and Plan: Chronic pain syndrome Multi-factorial pain syndrome  Plan Continue home regimen  DKA (diabetic ketoacidosis) (HCC) Patient with suboptimally controlled DM presents with serum glucose of 373, AG 17 CO2 21 - borderline for DKA in a setting of TIA  Plan Insulin infusion/DKA protocol initiated.  Once patient is stable will resume home regimen of basal insulin and sliding scale.   Slurred speech Patient with episode of slurred speech and left sided weakness resolved by time of arrival ot [facility]-ED. CT head negative for acute findings. Tele neurology consult Sr. Plantar recommended admission for CVA workup. Patient did have alcohol level of 316 but claims and wife verifies he has only had 2 ounces of alcohol in the last 24 hrs. He has a normal neuro exam at admission.  Plan Stroke protocol: MRI brain, MRA brain, US carotids, PT/OT/SLP eval  Continue ASA  Type 1 diabetes mellitus with hyperglycemia (HCC) Once DKA issue resolved resume home regimen  Hypertension associated with diabetes (HCC) BP well controlled  Plan Continue home regimen  GERD (gastroesophageal reflux disease) No active c/o of acute sympotms  Plan PPI bid while in hospital        DVT prophylaxis: Lovenox Code Status: Full Code Family Communication: wife present during exam  Disposition Plan: home when medically stable  Consults called: Neuro - Dr. Garnet Koyanagi, tele-neuro  Admission status: Observation, Telemetry bed   Illene Regulus, MD [facility] 02/26/2023, 2:22 AM

## 2023-02-26 NOTE — ED Notes (Signed)
Attempted to take patient to room on floor, Pt stated he's confused, stated he was told he would be discharged and wanted to speak to the doctor before he would go with tech.  This tech made RN Truddie Crumble and Charge nurse Danella Maiers aware of the situation.

## 2023-02-26 NOTE — ED Notes (Signed)
 Advised nurse that patient has ready bed

## 2023-02-26 NOTE — Evaluation (Signed)
Physical Therapy Evaluation Patient Details Name: Michael Hutchinson MRN: [id number] DOB: 1975-05-21 Today's Date: 02/26/2023  History of Present Illness  Pt is a 48 y/o M admitted on 02/25/23 after presenting with c/o episode of slurred speech & L sided weakness. MRI negative for acute issues. PMH: DKA, GERD, multiple ortho problems, s/p L BKA 2/2 osteomyelitis, MS, HTN, HLD, morbid obesity  Clinical Impression  Pt seen for PT evaluation with pt agreeable to tx. Pt reports prior to admission he was ambulating without AD in the home, walking stick outside, but falls ~once weekly with falls primarily occurring inside the home. On this date, pt is able to complete STS with mod I, ambulate in hallway with supervision fade to mod I. No overt LOB during gait but slight LOB when standing & showing visitor his prosthetic leg with pt able to self correct. Pt does not require acute PT services but is open to OPPT for balance training. Pt is eager to d/c home at this time.        If plan is discharge home, recommend the following:     Can travel by private vehicle        Equipment Recommendations Other (comment)  Recommendations for Other Services       Functional Status Assessment Patient has not had a recent decline in their functional status     Precautions / Restrictions Precautions Precautions: Fall Restrictions Weight Bearing Restrictions Per Provider Order: No      Mobility  Bed Mobility               General bed mobility comments: pt received & left sitting EOB    Transfers Overall transfer level: Modified independent                 General transfer comment: STS from stretcher without assistance    Ambulation/Gait Ambulation/Gait assistance: Supervision, Modified independent (Device/Increase time) Gait Distance (Feet): 200 Feet Assistive device: None Gait Pattern/deviations: Decreased weight shift to left, Wide base of support          Stairs             Wheelchair Mobility     Tilt Bed    Modified Rankin (Stroke Patients Only)       Balance Overall balance assessment: History of Falls, Needs assistance   Sitting balance-Leahy Scale: Normal     Standing balance support: During functional activity, No upper extremity supported Standing balance-Leahy Scale: Good                               Pertinent Vitals/Pain Pain Assessment Pain Assessment: No/denies pain    Home Living Family/patient expects to be discharged to:: Private residence Living Arrangements: Spouse/significant other Available Help at Discharge: Family Type of Home: House Home Access: Ramped entrance       Home Layout: One level Home Equipment: Other (comment) (walking stick)      Prior Function Prior Level of Function : Independent/Modified Independent;Working/employed;Driving;History of Falls (last six months)             Mobility Comments: works part time, driving, reports falls (at least 1 weekly 2/2 gait pattern with LLE prosthesis)       Extremity/Trunk Assessment   Upper Extremity Assessment Upper Extremity Assessment:  (denies numbness/tingling)    Lower Extremity Assessment Lower Extremity Assessment: LLE deficits/detail (denies numbness/tingling) LLE Deficits / Details: LLE prosthesis  Communication   Communication Communication: No apparent difficulties    Cognition Arousal: Alert Behavior During Therapy: WFL for tasks assessed/performed   PT - Cognitive impairments: No apparent impairments                         Following commands: Intact       Cueing       General Comments General comments (skin integrity, edema, etc.): HR 116 bpm at beginning of session, poor telemetry reading during gait, pt without c/o symptoms, HR 128 bpm (pt reports his HR is ~110 bpm at baseline)    Exercises     Assessment/Plan    PT Assessment All further PT needs can be met in the next venue  of care  PT Problem List Decreased balance;Decreased knowledge of use of DME       PT Treatment Interventions      PT Goals (Current goals can be found in the Care Plan section)  Acute Rehab PT Goals Patient Stated Goal: go home PT Goal Formulation: All assessment and education complete, DC therapy Time For Goal Achievement: 03/12/23 Potential to Achieve Goals: Good    Frequency       Co-evaluation               AM-PAC PT "6 Clicks" Mobility  Outcome Measure Help needed turning from your back to your side while in a flat bed without using bedrails?: None Help needed moving from lying on your back to sitting on the side of a flat bed without using bedrails?: None Help needed moving to and from a bed to a chair (including a wheelchair)?: None Help needed standing up from a chair using your arms (e.g., wheelchair or bedside chair)?: None Help needed to walk in hospital room?: None Help needed climbing 3-5 steps with a railing? : A Little 6 Click Score: 23    End of Session   Activity Tolerance: Patient tolerated treatment well Patient left: in bed;with call bell/phone within reach;with family/visitor present Nurse Communication: Mobility status PT Visit Diagnosis: History of falling (Z91.81)    Time: 1202-1212 PT Time Calculation (min) (ACUTE ONLY): 10 min   Charges:   PT Evaluation $PT Eval Low Complexity: 1 Low   PT General Charges $$ ACUTE PT VISIT: 1 Visit         Aleda Grana, PT, DPT 02/26/23, 12:21 PM   Sandi Mariscal 02/26/2023, 12:19 PM

## 2023-02-26 NOTE — Discharge Summary (Signed)
[facility] Discharge Summary   Patient: Michael Hutchinson BMW:[id number]  PCP: Melida Quitter, PA  Date of admission: 02/25/2023   Date of discharge: 02/26/2023     Discharge Diagnoses:  Active Problems:   Chronic pain syndrome   DKA (diabetic ketoacidosis) (HCC)   GERD (gastroesophageal reflux disease)   Hypertension associated with diabetes (HCC)   Type 1 diabetes mellitus with hyperglycemia (HCC)   Slurred speech   Admitted From: Home Disposition:  Home   Recommendations for Outpatient Follow-up:  Follow-up with PCP in 1 week, need to do echocardiogram as an outpatient. Follow-up with neurology in 1 to 2 weeks.  Continue aspirin and Plavix for 21 days total.  Followed by Plavix only Follow up LABS/TEST: 2D echocardiogram   Follow-up Information     Saralyn Pilar A, PA Follow up in 1 week(s).   Specialty: Family Medicine Contact information: [address] [place] [address] [phone]                Diet recommendation: Cardiac and Carb modified diet  Activity: The patient is advised to gradually reintroduce usual activities, as tolerated  Discharge Condition: stable  Code Status: Full code   History of present illness: As per the H and P dictated on admission Hospital Course:  Mr. 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 On the evening of 02/25/23 he had an episode of slurred speech and left sided weakness observed by his wife. Due to his symptoms he presented to [facility]ED for evaluation.     ED Course: T 99.3  113/94  HR 109  RR 16. On exam awake alert and cooperative, in no distress. Lab: K 3.8, AG 17, Cholride 21, Cr 1.67, Glucose 335, CBCD nl, EtOH 316. In ED CT head - negative for acute findings. Tele neuro consult - Dr. Plantar - recommends TIA workup as inpatient. [facility] called to admit for completion of neuro  workup and for management of borderline DKA.   Assessment and Plan:  # TIA, CVA ruled out, MRI brain negative Patient presented with an episode of slurred speech and left sided weakness resolved by time of arrival ot [facility]ED. CT head negative for acute findings. Tele neurology consult Sr. Plantar recommended admission for CVA workup. Patient did have alcohol level of 316 but claims and wife verifies he has only had 2 ounces of alcohol in the last 24 hrs. He has a normal neuro exam at admission. MRI brain: 1. No acute finding including infarct. 2. Moderate chronic white matter disease that is progressed from a 2019 comparison. Small vessel disease and/or chronic demyelination could give this appearance. MRA brain unremarkable US carotids: 1. Some acceleration in the left ICA favored secondary to ICA tortuosity and kink, no evidence of underlying plaque at this level. 2. Otherwise unremarkable. PT/OT/SLP eval done, no needs.  Neurology consulted, recommended aspirin and Plavix for 21 days followed by Plavix only.  Follow with neurology as an outpatient.  Cleared for discharge. 2D echocardiogram as an outpatient.  # Chronic pain syndrome: prn pain meds given # DKA (diabetic ketoacidosis) Type 1 diabetes mellitus with hyperglycemia (HCC)  Patient with suboptimally controlled DM presents with serum glucose of 373, AG 17 CO2 21 - borderline for DKA in a setting of TIA S/p isulin infusion/DKA protocol initiated. AG closed and patient was transition to basal and sliding scale. Patient  was stable and discharged on home regimen.  Advised to monitor CBG and continue diabetic diet and follow with PCP.  # Hypertension associated with diabetes: Resumed home meds Toprol-XL 100 mg, losartan 50 mg, amlodipine 5 mg p.o. daily patient was advised to monitor BP at home and follow with PCP. # GERD (gastroesophageal reflux disease)No active c/o of acute sympotms, continue pantoprazole 40 mg p.o. daily  Body mass  index is 44.64 kg/m.  Nutrition Interventions:  - Patient was instructed, not to drive, operate heavy machinery, perform activities at heights, swimming or participation in water activities or provide baby sitting services while on Pain, Sleep and Anxiety Medications; until his outpatient Physician has advised to do so again.  - Also recommended to not to take more than prescribed Pain, Sleep and Anxiety Medications.  Patient was seen by physical therapy, who recommended no therapy needed on discharge,  On the day of the discharge the patient's vitals were stable, and no other acute medical condition were reported by patient. the patient was felt safe to be discharge at Home.  Consultants: Neurology Procedures: none  Discharge Exam: General: Appear in no distress, no Rash; Oral Mucosa Clear, moist. Cardiovascular: S1 and S2 Present, no Murmur, Respiratory: normal respiratory effort, Bilateral Air entry present and no Crackles, no wheezes Abdomen: Bowel Sound present, Soft and no tenderness, no hernia Extremities: no Pedal edema, no calf tenderness Neurology: alert and oriented to time, place, and person affect appropriate.  Filed Weights   02/25/23 2124  Weight: (!) 149.3 kg   Vitals:   02/26/23 0748 02/26/23 1200  BP: (!) 163/94 (!) 165/98  Pulse: (!) 126 (!) 111  Resp: 17 (!) 21  Temp: 98.3 F (36.8 C)   SpO2: 90% 97%    DISCHARGE MEDICATION: Allergies as of 02/26/2023       Reactions   Influenza Vac Split Quad Nausea And Vomiting   Influenza Virus Vaccine Split Nausea And Vomiting   Statins Other (See Comments)   Myalgias   Hydrochlorothiazide Other (See Comments)   Dizziness   Lisinopril Cough        Medication List     TAKE these medications    acetone (urine) test strip 1 strip by Does not apply route as needed for high blood sugar.   albuterol 108 (90 Base) MCG/ACT inhaler Commonly known as: VENTOLIN HFA Inhale 2 puffs into the lungs every 6 (six)  hours as needed for wheezing or shortness of breath.   amLODipine 5 MG tablet Commonly known as: NORVASC Take 1 tablet (5 mg total) by mouth daily.   aspirin EC 81 MG tablet Take 1 tablet (81 mg total) by mouth daily for 20 days. Swallow whole. Start taking on: February 27, 2023   Blood Pressure Monitor/L Cuff Misc Use blood pressure cuff and monitor to check blood pressure at home at least several times per week if not daily.   cetirizine 10 MG tablet Commonly known as: ZyrTEC Allergy Take 1 tablet (10 mg total) by mouth daily as needed for allergies. Home med.   clopidogrel 75 MG tablet Commonly known as: PLAVIX Take 1 tablet (75 mg total) by mouth daily. Start taking on: February 27, 2023   Dexcom G6 Receiver Hardie Pulley See admin instructions.   Dexcom G6 Sensor Misc SMARTSIG:1 Topical Every 10 Days   Dexcom G6 Transmitter Misc DISPENSE AND USE AS DIRECTED   diazepam 10 MG tablet Commonly known as: Valium Take 1 tablet 30 to 40 minutes prior to MRI.  May take 1 tablet just before MRI if needed.   ezetimibe 10 MG tablet Commonly known as: ZETIA Take 1 tablet (10 mg total) by mouth daily.   fluticasone 50 MCG/ACT nasal spray Commonly known as: FLONASE Place 2 sprays into both nostrils daily as needed for allergies or rhinitis.   glucose blood test strip Commonly known as: Banker Next Test 1 each by Other route 4 (four) times daily. And lancets 4/day   [name] 200 UNIT/ML KwikPen Generic drug: insulin lispro 1 unit per 12 grams of carbs, and Novolog 1 unit per every 50 mg/dl above target glucose.  While off insulin pump.   HYDROcodone-acetaminophen 5-325 MG tablet Commonly known as: NORCO/VICODIN Take 1 tablet by mouth every 4 (four) hours as needed.   Lantus SoloStar 100 UNIT/ML Solostar Pen Generic drug: insulin glargine Inject 20 Units into the skin daily. While off insulin pump.   losartan 50 MG tablet Commonly known as: COZAAR TAKE 1 TABLET  BY MOUTH ONCE DAILY   metoprolol succinate 100 MG 24 hr tablet Commonly known as: TOPROL-XL Take 1 tablet (100 mg total) by mouth daily. TAKE WITH OR IMMEDIATELY FOLLOWING A MEAL.   multivitamin with minerals tablet Take 1 tablet by mouth daily.   Ocrevus 300 MG/10ML injection Generic drug: ocrelizumab Inject 300 mg into the vein every 6 (six) months.   Omnipod 5 DexG7G6 Pods Gen 5 Misc Inject 1 Device into the skin daily.   ondansetron 4 MG disintegrating tablet Commonly known as: ZOFRAN-ODT Take 1 tablet (4 mg total) by mouth every 8 (eight) hours as needed for nausea or vomiting.   pantoprazole 40 MG tablet Commonly known as: PROTONIX Take 1 tablet (40 mg total) by mouth daily. Start taking on: February 27, 2023   Pen Needles 32G X 4 MM Misc USE 5 TIMES DAILY AS DIRECTED   rizatriptan 10 MG disintegrating tablet Commonly known as: Maxalt-MLT Take 1 tablet earliest onset of headache.  May repeat after 2 hours if needed. Maximum 2 tablets in 24 hours       Allergies  Allergen Reactions   Influenza Vac Split Quad Nausea And Vomiting   Influenza Virus Vaccine Split Nausea And Vomiting   Statins Other (See Comments)    Myalgias   Hydrochlorothiazide Other (See Comments)    Dizziness   Lisinopril Cough   Discharge Instructions     Ambulatory referral to Neurology   Complete by: As directed    An appointment is requested in approximately: 1 week   Call MD for:   Complete by: As directed    Weakness or numbness, slurred speech, vision changes.  Any new neurological changes.   Call MD for:  difficulty breathing, headache or visual disturbances   Complete by: As directed    Call MD for:  extreme fatigue   Complete by: As directed    Call MD for:  persistant dizziness or light-headedness   Complete by: As directed    Call MD for:  persistant nausea and vomiting   Complete by: As directed    Call MD for:  severe uncontrolled pain   Complete by: As directed     Call MD for:  temperature >100.4   Complete by: As directed    Diet - low sodium heart healthy   Complete by: As directed    Discharge instructions   Complete by: As directed    Follow-up with PCP in 1 week, need to do echocardiogram as an outpatient. Follow-up with  neurology in 1 to 2 weeks.  Continue aspirin and Plavix for 21 days total.  Followed by Plavix only.   Increase activity slowly   Complete by: As directed        The results of significant diagnostics from this hospitalization (including imaging, microbiology, ancillary and laboratory) are listed below for reference.    Significant Diagnostic Studies: US Carotid Bilateral (at [facility] and [facility] only) Result Date: 02/26/2023 CLINICAL DATA:  CVA EXAM: BILATERAL CAROTID DUPLEX ULTRASOUND TECHNIQUE: Wallace Cullens scale imaging, color Doppler and duplex ultrasound were performed of bilateral carotid and vertebral arteries in the neck. COMPARISON:  CTA of the neck 03/28/2017 FINDINGS: Criteria: Quantification of carotid stenosis is based on velocity parameters that correlate the residual internal carotid diameter with NASCET-based stenosis levels, using the diameter of the distal internal carotid lumen as the denominator for stenosis measurement. The following velocity measurements were obtained: RIGHT ICA: 107/25 cm/sec CCA: 105/15 cm/sec SYSTOLIC ICA/CCA RATIO:  1 ECA: 110 cm/sec LEFT ICA: 153/26 cm/sec CCA: 108/20 cm/sec SYSTOLIC ICA/CCA RATIO: 1.4 ECA: 148 cm/sec RIGHT CAROTID ARTERY: Unremarkable.  No detected flap or plaque. RIGHT VERTEBRAL ARTERY:  Antegrade flow with normal waveform. LEFT CAROTID ARTERY: Some ICA tortuosity with kink, also seen on prior CTA. LEFT VERTEBRAL ARTERY:  Antegrade flow with normal waveform IMPRESSION: 1. Some acceleration in the left ICA favored secondary to ICA tortuosity and kink, no evidence of underlying plaque at this level. 2. Otherwise unremarkable. Electronically Signed   By: Tiburcio Pea M.D.   On: 02/26/2023  07:02   MR BRAIN WO CONTRAST Result Date: 02/26/2023 CLINICAL DATA:  TIA. Left-sided paresthesia and weakness with slurred speech. EXAM: MRI HEAD WITHOUT CONTRAST MRA HEAD WITHOUT CONTRAST TECHNIQUE: Multiplanar, multi-echo pulse sequences of the brain and surrounding structures were acquired without intravenous contrast. Angiographic images of the Circle of Willis were acquired using MRA technique without intravenous contrast. COMPARISON:  Head CT from yesterday.  Brain MRI 05/29/2017 FINDINGS: MRI HEAD FINDINGS Brain: No acute infarction, hemorrhage, hydrocephalus, extra-axial collection or mass lesion. Chronic white matter disease with FLAIR hyperintensity in the cerebral white matter. Discrete ovoid areas of FLAIR hyperintensity in the left more than right deep white matter have been present since at least 2019, likely chronic small vessel ischemia with lacunar infarcts given the patient's strong microvascular history, chronic demyelination is not excluded. Brain volume remains normal. Vascular: See below Skull and upper cervical spine: Normal marrow signal Sinuses/Orbits: Small left globe with internal calcification and signal alteration, phthsis bulbi. MRA HEAD FINDINGS Anterior circulation: Vessels are smoothly contoured and widely patent without aneurysm or vascular malformation. Posterior circulation: The vertebral arteries and proximal basilar artery are not covered. No indication of proximal diminished flow where seen. Covered vessels are smoothly contoured and diffusely patent. No aneurysm or vascular malformation. IMPRESSION: Brain MRI: 1. No acute finding including infarct. 2. Moderate chronic white matter disease that is progressed from a 2019 comparison. Small vessel disease and/or chronic demyelination could give this appearance. Intracranial MRA: The covered intracranial arteries are unremarkable. Electronically Signed   By: Tiburcio Pea M.D.   On: 02/26/2023 04:52   MR ANGIO HEAD WO  CONTRAST Result Date: 02/26/2023 CLINICAL DATA:  TIA. Left-sided paresthesia and weakness with slurred speech. EXAM: MRI HEAD WITHOUT CONTRAST MRA HEAD WITHOUT CONTRAST TECHNIQUE: Multiplanar, multi-echo pulse sequences of the brain and surrounding structures were acquired without intravenous contrast. Angiographic images of the Circle of Willis were acquired using MRA technique without intravenous contrast. COMPARISON:  Head CT from  yesterday.  Brain MRI 05/29/2017 FINDINGS: MRI HEAD FINDINGS Brain: No acute infarction, hemorrhage, hydrocephalus, extra-axial collection or mass lesion. Chronic white matter disease with FLAIR hyperintensity in the cerebral white matter. Discrete ovoid areas of FLAIR hyperintensity in the left more than right deep white matter have been present since at least 2019, likely chronic small vessel ischemia with lacunar infarcts given the patient's strong microvascular history, chronic demyelination is not excluded. Brain volume remains normal. Vascular: See below Skull and upper cervical spine: Normal marrow signal Sinuses/Orbits: Small left globe with internal calcification and signal alteration, phthsis bulbi. MRA HEAD FINDINGS Anterior circulation: Vessels are smoothly contoured and widely patent without aneurysm or vascular malformation. Posterior circulation: The vertebral arteries and proximal basilar artery are not covered. No indication of proximal diminished flow where seen. Covered vessels are smoothly contoured and diffusely patent. No aneurysm or vascular malformation. IMPRESSION: Brain MRI: 1. No acute finding including infarct. 2. Moderate chronic white matter disease that is progressed from a 2019 comparison. Small vessel disease and/or chronic demyelination could give this appearance. Intracranial MRA: The covered intracranial arteries are unremarkable. Electronically Signed   By: Tiburcio Pea M.D.   On: 02/26/2023 04:52   CT HEAD CODE STROKE WO CONTRAST Result Date:  02/25/2023 CLINICAL DATA:  Code stroke.  Neuro deficit, acute, stroke suspected EXAM: CT HEAD WITHOUT CONTRAST TECHNIQUE: Contiguous axial images were obtained from the base of the skull through the vertex without intravenous contrast. RADIATION DOSE REDUCTION: This exam was performed according to the departmental dose-optimization program which includes automated exposure control, adjustment of the mA and/or kV according to patient size and/or use of iterative reconstruction technique. COMPARISON:  None Available. FINDINGS: Brain: No evidence of acute infarction, hemorrhage, hydrocephalus, extra-axial collection or mass lesion/mass effect. Chronic microvascular ischemic disease and remote appearing corona radiata lacunar infarcts. Vascular: No hyperdense vessel. Skull: No acute fracture. Sinuses/Orbits: Clear sinuses. Calcifications and vitreous hyperdensities in the left globe with small left globe. Other: No mastoid effusions. ASPECTS [facility] Stroke Program Early CT Score) Total score (0-10 with 10 being normal): 10. IMPRESSION: 1. No evidence of acute intracranial abnormality. ASPECTS is 10. 2. Chronic microvascular ischemic disease and remote appearing corona radiata lacunar infarcts. 3. Left globe calcifications and vitreous hyperdensities, presumably the sequela of prior insult. Correlate with ophthalmologic history and funduscopic exam if clinically warranted. Code stroke imaging results were communicated on 02/25/2023 at 9:27 pm to provider Ray via telephone, who verbally acknowledged these results. Electronically Signed   By: Feliberto Harts M.D.   On: 02/25/2023 21:30    Microbiology: No results found for this or any previous visit (from the past 240 hours).   Labs: CBC: Recent Labs  Lab 02/25/23 2135  WBC 9.3  NEUTROABS 7.0  HGB 13.3  HCT 38.9*  MCV 91.3  PLT 285   Basic Metabolic Panel: Recent Labs  Lab 02/25/23 2135 02/26/23 0230 02/26/23 0455  NA 134* 136 136  K 3.8 3.9 4.2   CL 96* 102 100  CO2 21* 22 22  GLUCOSE 335* 130* 150*  BUN 15 13 12   CREATININE 1.07 0.80 0.86  CALCIUM 8.8* 8.6* 8.4*  MG  --   --  1.9  PHOS  --   --  1.8*   Liver Function Tests: Recent Labs  Lab 02/25/23 2135  AST 14*  ALT 15  ALKPHOS 107  BILITOT 0.7  PROT 7.2  ALBUMIN 3.8   No results for input(s): "LIPASE", "AMYLASE" in the last 168 hours. No  results for input(s): "AMMONIA" in the last 168 hours. Cardiac Enzymes: No results for input(s): "CKTOTAL", "CKMB", "CKMBINDEX", "TROPONINI" in the last 168 hours. BNP (last 3 results) No results for input(s): "BNP" in the last 8760 hours. CBG: Recent Labs  Lab 02/26/23 0225 02/26/23 0335 02/26/23 0445 02/26/23 0747 02/26/23 1231  GLUCAP 104* 118* 128* 188* 192*    Time spent: 35 minutes  Signed:  Gillis Santa  [facility] 02/26/2023 2:50 PM

## 2023-02-26 NOTE — Progress Notes (Signed)
       CROSS COVER NOTE  NAME: Terion Hedman MRN: [id number] DOB : [birth year] ATTENDING PHYSICIAN: Jacques Navy, MD    Date of Service   02/26/2023   HPI/Events of Note   Notified of nurse patient on insulin drip.cbg 100 Long acting insulin already ordered but no transition orders  Interventions   Assessment/Plan:    02/26/2023    4:45 AM 02/25/2023    9:24 PM 02/25/2023    9:23 PM  Vitals with BMI  Height  6\' 0"    Weight  329 lbs 2 oz   BMI  44.63   Systolic 145  113  Diastolic 71  94  Pulse 110  109   Labs were consistent with alcohol induced anion gap acidosis, with hyperglycemia now resolved.  Turn insulin off,  Discontinue iv fluids as long as able to eat and drink Semglee previously ordered can be given now with diet SSI achs - add back home dose meal insulin if taking po well        Donnie Mesa NP [facility] Cross Cover 7pm-7am - check amion for availability Pager [phone]

## 2023-02-26 NOTE — Assessment & Plan Note (Signed)
No active c/o of acute sympotms  Plan PPI bid while in hospital

## 2023-02-26 NOTE — Assessment & Plan Note (Signed)
Patient with episode of slurred speech and left sided weakness resolved by time of arrival ot [facility]ED. CT head negative for acute findings. Tele neurology consult Sr. Plantar recommended admission for CVA workup. Patient did have alcohol level of 316 but claims and wife verifies he has only had 2 ounces of alcohol in the last 24 hrs. He has a normal neuro exam at admission.  Plan Stroke protocol: MRI brain, MRA brain, US carotids, PT/OT/SLP eval  Continue ASA

## 2023-02-26 NOTE — Progress Notes (Signed)
OT Cancellation Note  Patient Details Name: Kwadwo Taras MRN: [id number] DOB: 1975-05-19   Cancelled Treatment:    Reason Eval/Treat Not Completed: OT screened, no needs identified, will sign off. Order received, chart reviewed. Per conversation with PT, pt back to baseline functional independence. No skilled OT needs identified. Will sign off. Please re-consult if additional needs arise.   Kathie Dike, M.S. OTR/L  02/26/23, 12:27 PM  ascom [phone]

## 2023-02-26 NOTE — Assessment & Plan Note (Signed)
Multi-factorial pain syndrome  Plan Continue home regimen

## 2023-03-02 ENCOUNTER — Ambulatory Visit: Payer: Medicaid Other | Admitting: Internal Medicine

## 2023-03-06 ENCOUNTER — Telehealth: Payer: Self-pay

## 2023-03-06 NOTE — Telephone Encounter (Signed)
 Transition Care Management Follow-up Telephone Call     Date discharged? 02/26/2023   How have you been since you were released from the hospital? Feels good   Any patient concerns? Pt wants to make sure that he doesn't have a relapse staying chill for the next 3 weeks. Pt is asking if the MRI he had done int ER is the same as the MRI ordered for his Ocrevus?     Items Reviewed: Medications reviewed:  Medication List       TAKE these medications     acetone (urine) test strip 1 strip by Does not apply route as needed for high blood sugar.    albuterol 108 (90 Base) MCG/ACT inhaler Commonly known as: VENTOLIN HFA Inhale 2 puffs into the lungs every 6 (six) hours as needed for wheezing or shortness of breath.    amLODipine 5 MG tablet Commonly known as: NORVASC Take 1 tablet (5 mg total) by mouth daily.    aspirin EC 81 MG tablet Take 1 tablet (81 mg total) by mouth daily for 20 days. Swallow whole. Start taking on: February 27, 2023    Blood Pressure Monitor/L Cuff Misc Use blood pressure cuff and monitor to check blood pressure at home at least several times per week if not daily.    cetirizine 10 MG tablet Commonly known as: ZyrTEC Allergy Take 1 tablet (10 mg total) by mouth daily as needed for allergies. Home med.    clopidogrel 75 MG tablet Commonly known as: PLAVIX Take 1 tablet (75 mg total) by mouth daily. Start taking on: February 27, 2023    Dexcom G6 Receiver Hardie Pulley See admin instructions.    Dexcom G6 Sensor Misc SMARTSIG:1 Topical Every 10 Days    Dexcom G6 Transmitter Misc DISPENSE AND USE AS DIRECTED    diazepam 10 MG tablet Commonly known as: Valium Take 1 tablet 30 to 40 minutes prior to MRI.  May take 1 tablet just before MRI if needed.    ezetimibe 10 MG tablet Commonly known as: ZETIA Take 1 tablet (10 mg total) by mouth daily.    fluticasone 50 MCG/ACT nasal spray Commonly known as: FLONASE Place 2 sprays into both nostrils daily as  needed for allergies or rhinitis.    glucose blood test strip Commonly known as: Banker Next Test 1 each by Other route 4 (four) times daily. And lancets 4/day    [name] 200 UNIT/ML KwikPen Generic drug: insulin lispro 1 unit per 12 grams of carbs, and Novolog 1 unit per every 50 mg/dl above target glucose.  While off insulin pump.    HYDROcodone-acetaminophen 5-325 MG tablet Commonly known as: NORCO/VICODIN Take 1 tablet by mouth every 4 (four) hours as needed.    Lantus SoloStar 100 UNIT/ML Solostar Pen Generic drug: insulin glargine Inject 20 Units into the skin daily. While off insulin pump.    losartan 50 MG tablet Commonly known as: COZAAR TAKE 1 TABLET BY MOUTH ONCE DAILY    metoprolol succinate 100 MG 24 hr tablet Commonly known as: TOPROL-XL Take 1 tablet (100 mg total) by mouth daily. TAKE WITH OR IMMEDIATELY FOLLOWING A MEAL.    multivitamin with minerals tablet Take 1 tablet by mouth daily.    Ocrevus 300 MG/10ML injection Generic drug: ocrelizumab Inject 300 mg into the vein every 6 (six) months.    Omnipod 5 DexG7G6 Pods Gen 5 Misc Inject 1 Device into the skin daily.    ondansetron 4 MG disintegrating tablet Commonly  known as: ZOFRAN-ODT Take 1 tablet (4 mg total) by mouth every 8 (eight) hours as needed for nausea or vomiting.    pantoprazole 40 MG tablet Commonly known as: PROTONIX Take 1 tablet (40 mg total) by mouth daily. Start taking on: February 27, 2023    Pen Needles 32G X 4 MM Misc USE 5 TIMES DAILY AS DIRECTED    rizatriptan 10 MG disintegrating tablet Commonly known as: Maxalt-MLT Take 1 tablet earliest onset of headache.  May repeat after 2 hours if needed. Maximum 2 tablets in 24 hours     Allergies reviewed:   Influenza Vac Split Quad Nausea And Vomiting   Influenza Virus Vaccine Split Nausea And Vomiting   Statins Other (See Comments)      Myalgias   Hydrochlorothiazide Other (See Comments)      Dizziness    Lisinopril Cough   Dietary changes reviewed: no Diet change  Referrals reviewed: no referrals just follow up with Neuro      Functional Questionnaire:  Independent - I Dependent - D    Activities of Daily Living (ADLs):     Personal hygiene - D Dressing - I Eating - I Maintaining continence - I Transferring - Moderate needs help at time   Independent Activities of Daily Living (iADLs): Basic communication skills - I Transportation - I Meal preparation  - I Shopping - I Housework - Middle needs help at times  Managing medications - I Managing personal finances - I   Confirmed importance and date/time of follow-up visits scheduled 03/06/24 Provider Appointment booked with Dr Everlena Cooper   Confirmed with patient if condition begins to worsen call PCP or go to the ER.  Patient was given the office number and encouraged to call back with question or concerns: Yes

## 2023-03-07 ENCOUNTER — Ambulatory Visit (INDEPENDENT_AMBULATORY_CARE_PROVIDER_SITE_OTHER): Payer: Medicaid Other | Admitting: Neurology

## 2023-03-07 ENCOUNTER — Encounter: Payer: Self-pay | Admitting: Neurology

## 2023-03-07 ENCOUNTER — Other Ambulatory Visit: Payer: Medicaid Other

## 2023-03-07 VITALS — BP 139/85 | HR 81 | Ht 72.0 in | Wt 323.6 lb

## 2023-03-07 DIAGNOSIS — E1010 Type 1 diabetes mellitus with ketoacidosis without coma: Secondary | ICD-10-CM

## 2023-03-07 DIAGNOSIS — G35 Multiple sclerosis: Secondary | ICD-10-CM | POA: Diagnosis not present

## 2023-03-07 NOTE — Patient Instructions (Addendum)
 I do not think you had a TIA.  Therefore, you may stop aspirin and Plavix.  I do not think cardiac workup is needed.  We still need to perform MRI of brain WITH contrast Get immunoglobulin levels and vit D. We can proceed with Ocrevus. Keep appointment in August

## 2023-03-07 NOTE — Progress Notes (Unsigned)
 NEUROLOGY FOLLOW UP OFFICE NOTE  [name] [id number]  Assessment/Plan:   1  Transient slurred speech:  I do not suspect TIA.  I think he had a metabolic encephalopathy related to his hyperglycemia/DKA while on antibiotics and alcohol.  Also, hyperglycemia/DKA may mimic stroke.  There is documentation of left sided weakness but no elaboration.  Exam findings did not reveal unilateral weakness.  Patient and wife did not appreciate unilateral weakness.   However, I would still evaluate for possible acute MS flare 2   Type 1 diabetes with DKA 3  Multiple sclerosis:  While ischemic cranial nerve palsies are seen in uncontrolled diabetes, given the 3 distinct episodes of various cranial nerve palsies, the appearance of white matter lesions on brain MRI, and CSF analysis demonstrated for oligoclonal bands not present in serum, I suspect MS.  He does have significantly elevated CSF protein but does not exhibit any sign of infection.  Diabetes may be contributor to this. 4  Hypertension 5  Possible episodic cluster headaches 6  Asymmetric pupils, clouding of left pupil - suspect diabetic-related but also possibly a cataract?  He denies routine eye exams and given his uncontrolled type 1 diabetes, he should be followed by ophthalmology anyway.     To further evaluate possibility of MS flare, check MRI of brain WITH contrast. As I do not suspect TIA, he may discontinue ASA and Plavix.  I do not think cardiac workup is indicated. DMT:  Ocrevus Check immunoglobulin levels and vit D today Follow up in August as scheduled.  Total time spent in chart and face to face with patient and his wife:  46 minutes.   Subjective:  Michael Hutchinson is a 48 year old right-handed Caucasian man with type 1 diabetes with history of DKA, hypertension, Charcot's joint of foot, left below the knee amputation, hyperlipidemia and GERD and multiple sclerosis who follows up for recent hospitalization.  MRI/MRA head  personally reviewed.   UPDATE: Current disease modifying therapy: Ocrevus (status post 1 infusion - next infusion next month) Other current medications: D3 6000 IU daily  On 2/15, he developed slurred speech, lethargy and generalized weakness.  He couldn't stand.  He felt "drunk" and was drinking alcohol but not excessive.  He had been in pain due to a tooth abscess and is taking antibiotics.   EMS recorded his blood glucose in the 400s and his wife gave him 12 units of insulin.  Brought to [facility].  Notes mention that he had left sided weakness however both patient and his wife dispute this.  She didn't notice any facial droop but his face was swollen due to the dental abscess.  Exam in the ED did not demonstrate any unilateral weakness, however it is reported that he endorsed left sided subjective diminished sensation on the left.  Glucose 335 in ED with bicarb 21 and anion gap of 17.  EtOH level was 316.  CT head was negative for acute infarct, as was MRI of brain without contrast revealed moderate chronic white matter disease but no acute infarct.  MRA of head negative for LVO or hemodynamically significant stenosis.  Carotid US showed some acceleration in the left ICA favored to be secondary to tortuosity and kink rather than stenosis.  Hgb A1c was 7.  Discharged on ASA and Plavix for 21 days, followed by Plavix alone.  He was set up for outpatient cardiac workup as well.   02/25/2023 CT HEAD WO:  1. No evidence of acute intracranial abnormality.  ASPECTS is 10. 2. Chronic microvascular ischemic disease and remote appearing corona radiata lacunar infarcts. 3. Left globe calcifications and vitreous hyperdensities, presumably the sequela of prior insult. Correlate with ophthalmologic history and funduscopic exam if clinically warranted. 02/26/2023 MRI BRAIN WO:  1. No acute finding including infarct. 2. Moderate chronic white matter disease that is progressed from a 2019 comparison. Small vessel disease  and/or chronic demyelination could give this appearance. 02/26/2023 MRA HEAD WO:  The covered intracranial arteries are unremarkable.  02/26/2023 US CAROTID:  1. Some acceleration in the left ICA favored secondary to ICA tortuosity and kink, no evidence of underlying plaque at this level. 2. Otherwise unremarkable.   HISTORY:  In October 2016, he developed a right 6th cranial nerve palsy which was thought to be due to his uncontrolled type 1 diabetes.   In March 2017, he developed blurred vision in the left eye.  He saw ophthalmologist Dr. Mack Hook who diagnosed optic neuritis.  MRI of the brain and orbits with and without contrast from 05/11/2015 revealed "2 T2 hyperintense lesions within the left hemisphere adjacent to the posterior horn of the left lateral ventricle ", a nonspecific finding and without abnormal contrast enhancement.   In March 2019, he developed disconjugate gaze and droopy left eyelid.  He saw the eye doctor who diagnosed a left 3rd nerve palsy.  He was evaluated in the ED at [facility] on 03/28/2017.  CTA of the head was negative for aneurysm.  MRI of the brain with and without contrast demonstrated multiple T2/flair hyperintense foci involving the periventricular, deep and subcortical white matter, progressed compared to prior MRI from 2017.  In-house neurology believed his findings were more likely due to a diabetic 3rd nerve palsy rather than MS.  He was advised to start aspirin 81 mg daily and follow-up with outpatient neurology.  He subsequently underwent a work-up for multiple sclerosis.  MRI of the cervical spine with and without contrast from 05/04/2017 revealed no cord lesions.  He underwent lumbar puncture on 05/19/2017 for CSF analysis which revealed elevated IgG index of 0.79 and 4 oligoclonal bands not present in the corresponding serum.  Cell count was 1, elevated protein 157, elevated glucose 100, negative myelin basic protein, ACE 4, Gram stain and culture  negative.   Past disease modifying therapy: Tecfidera (GI upset), Ocrevus (hives but declined premedicated Benadryl)   Imaging: 05/11/2015 MRI BRAIN W WO:  Two T2 hyperintense lesions within the left hemisphere adjacent to the posterior horn of the left lateral ventricle. The finding is nonspecific but can be seen in the setting of chronic microvascular ischemia, a demyelinating process such as multiple sclerosis, vasculitis, complicated migraine headaches, or as the sequelae of a prior infectious or inflammatory process. 05/11/2015 MRI ORBITS W WO:  Within normal limits 03/28/2017 MRI BRAIN & ORBITS W WO:  1. Multiple T2/FLAIR hyperintense foci involving the periventricular, deep, and subcortical white matter of both cerebral hemispheres in a distribution highly suspicious for possible demyelinating disease/multiple sclerosis. Changes have progressed relative to 2017. No evidence for active demyelination.  2. Negative MRI of the orbits. No findings to suggest acute optic neuritis or other abnormality identified. 3. Bilateral mastoid effusions, left greater than right. 05/04/2017 MRI CERVICAL SPINE W WO:  Normal 02/25/2018 MRI BRAIN W WO: No change since the study of March 2019. Multiple foci of abnormal T2 and FLAIR signal within the cerebral hemispheric deep and subcortical white matter, consistent with the clinical diagnosis of multiple sclerosis. No new  or progressive lesions. No lesions show contrast enhancement or restricted diffusion.  PAST MEDICAL HISTORY: Past Medical History:  Diagnosis Date   Charcot's joint of foot 11/25/2013   Complication of anesthesia    "I wake up angry" (12/31/2015)   Diabetes mellitus type 1 (HCC) dx'd 1981   Diabetic ketoacidosis (HCC)    Essential hypertension 05/03/2013   GERD (gastroesophageal reflux disease)    High cholesterol    Hx MRSA infection    Inner thigh and under arm- healed areas   Meniscus tear     MEDICATIONS: Current Outpatient  Medications on File Prior to Visit  Medication Sig Dispense Refill   acetone, urine, test strip 1 strip by Does not apply route as needed for high blood sugar. 50 each 0   albuterol (VENTOLIN HFA) 108 (90 Base) MCG/ACT inhaler Inhale 2 puffs into the lungs every 6 (six) hours as needed for wheezing or shortness of breath. 8 g 2   amLODipine (NORVASC) 5 MG tablet Take 1 tablet (5 mg total) by mouth daily. 90 tablet 3   aspirin EC 81 MG tablet Take 1 tablet (81 mg total) by mouth daily for 20 days. Swallow whole. 20 tablet 0   Blood Pressure Monitoring (BLOOD PRESSURE MONITOR/L CUFF) MISC Use blood pressure cuff and monitor to check blood pressure at home at least several times per week if not daily. 1 each 0   cetirizine (ZYRTEC ALLERGY) 10 MG tablet Take 1 tablet (10 mg total) by mouth daily as needed for allergies. Home med.     clopidogrel (PLAVIX) 75 MG tablet Take 1 tablet (75 mg total) by mouth daily. 30 tablet 11   Continuous Blood Gluc Receiver (DEXCOM G6 RECEIVER) DEVI See admin instructions.     Continuous Blood Gluc Sensor (DEXCOM G6 SENSOR) MISC SMARTSIG:1 Topical Every 10 Days     Continuous Blood Gluc Transmit (DEXCOM G6 TRANSMITTER) MISC DISPENSE AND USE AS DIRECTED     diazepam (VALIUM) 10 MG tablet Take 1 tablet 30 to 40 minutes prior to MRI.  May take 1 tablet just before MRI if needed. 2 tablet 0   ezetimibe (ZETIA) 10 MG tablet Take 1 tablet (10 mg total) by mouth daily. 90 tablet 3   fluticasone (FLONASE) 50 MCG/ACT nasal spray Place 2 sprays into both nostrils daily as needed for allergies or rhinitis.     glucose blood (BAYER CONTOUR NEXT TEST) test strip 1 each by Other route 4 (four) times daily. And lancets 4/day 400 each 3   [name] 200 UNIT/ML KwikPen 1 unit per 12 grams of carbs, and Novolog 1 unit per every 50 mg/dl above target glucose.  While off insulin pump.     HYDROcodone-acetaminophen (NORCO/VICODIN) 5-325 MG tablet Take 1 tablet by mouth every 4 (four)  hours as needed. 30 tablet 0   Insulin Disposable Pump (OMNIPOD 5 G6 PODS, GEN 5,) MISC Inject 1 Device into the skin daily.     insulin glargine (LANTUS SOLOSTAR) 100 UNIT/ML Solostar Pen Inject 20 Units into the skin daily. While off insulin pump. 3 mL 2   Insulin Pen Needle (PEN NEEDLES) 32G X 4 MM MISC USE 5 TIMES DAILY AS DIRECTED     losartan (COZAAR) 50 MG tablet TAKE 1 TABLET BY MOUTH ONCE DAILY 90 tablet 1   metoprolol succinate (TOPROL-XL) 100 MG 24 hr tablet Take 1 tablet (100 mg total) by mouth daily. TAKE WITH OR IMMEDIATELY FOLLOWING A MEAL. 90 tablet 1   Multiple Vitamins-Minerals (  MULTIVITAMIN WITH MINERALS) tablet Take 1 tablet by mouth daily.     OCREVUS 300 MG/10ML injection Inject 300 mg into the vein every 6 (six) months.     ondansetron (ZOFRAN-ODT) 4 MG disintegrating tablet Take 1 tablet (4 mg total) by mouth every 8 (eight) hours as needed for nausea or vomiting. 12 tablet 0   pantoprazole (PROTONIX) 40 MG tablet Take 1 tablet (40 mg total) by mouth daily. 30 tablet 0   rizatriptan (MAXALT-MLT) 10 MG disintegrating tablet Take 1 tablet earliest onset of headache.  May repeat after 2 hours if needed. Maximum 2 tablets in 24 hours 10 tablet 5   No current facility-administered medications on file prior to visit.    ALLERGIES: Allergies  Allergen Reactions   Influenza Vac Split Quad Nausea And Vomiting   Influenza Virus Vaccine Split Nausea And Vomiting   Statins Other (See Comments)    Myalgias   Hydrochlorothiazide Other (See Comments)    Dizziness   Lisinopril Cough    FAMILY HISTORY: Family History  Problem Relation Age of Onset   Other Mother    Cancer Mother        Breast / Bone   Heart attack Father    Hypertension Father    Hyperlipidemia Father    Diabetes Other    Alcohol abuse Sister    Diabetes Maternal Grandfather    Stroke Paternal Grandmother    Alcohol abuse Paternal Grandfather       Objective:  Blood pressure 139/85, pulse 81,  height 6' (1.829 m), weight (!) 323 lb 9.6 oz (146.8 kg), SpO2 99%. General: No acute distress.  Patient appears well-groomed.   Head:  Normocephalic/atraumatic Eyes:  Fundi examined but not visualized Neck: supple, no paraspinal tenderness, full range of motion Heart:  Regular rate and rhythm Neurological Exam: alert and oriented.  Speech fluent and not dysarthric, language intact.  Asymmetric pupils (OD > OS), OS poorly reactive with clouding.  Otherwise, CN II-XII intact. Bulk and tone normal, muscle strength 5/5 throughout (left BTK amputation).  Sensation to light touch intact.  Deep tendon reflexes absent throughout, toes downgoing.  Finger to nose testing intact.  Gait stable   Shon Millet, DO  CC: Saralyn Pilar, [place]

## 2023-03-08 LAB — IGG, IGA, IGM
IgG (Immunoglobin G), Serum: 878 mg/dL (ref 600–1640)
IgM, Serum: 34 mg/dL — ABNORMAL LOW (ref 50–300)
Immunoglobulin A: 249 mg/dL (ref 47–310)

## 2023-03-08 LAB — VITAMIN D 25 HYDROXY (VIT D DEFICIENCY, FRACTURES): Vit D, 25-Hydroxy: 38 ng/mL (ref 30–100)

## 2023-03-10 ENCOUNTER — Other Ambulatory Visit: Payer: Medicaid Other

## 2023-03-10 ENCOUNTER — Ambulatory Visit: Admission: RE | Admit: 2023-03-10 | Payer: Medicaid Other | Source: Ambulatory Visit

## 2023-03-20 ENCOUNTER — Other Ambulatory Visit (HOSPITAL_COMMUNITY): Payer: Self-pay

## 2023-03-30 ENCOUNTER — Inpatient Hospital Stay: Admission: RE | Admit: 2023-03-30 | Source: Ambulatory Visit

## 2023-05-17 ENCOUNTER — Ambulatory Visit: Payer: Medicaid Other | Attending: Cardiology | Admitting: Cardiology

## 2023-05-30 ENCOUNTER — Other Ambulatory Visit: Payer: Self-pay | Admitting: *Deleted

## 2023-05-30 ENCOUNTER — Other Ambulatory Visit: Payer: Self-pay

## 2023-05-30 ENCOUNTER — Emergency Department
Admission: EM | Admit: 2023-05-30 | Discharge: 2023-05-30 | Discharge status: Against Medical Advice | Attending: Emergency Medicine | Admitting: Emergency Medicine

## 2023-05-30 DIAGNOSIS — T23051A Burn of unspecified degree of right palm, initial encounter: Secondary | ICD-10-CM | POA: Diagnosis present

## 2023-05-30 DIAGNOSIS — E1069 Type 1 diabetes mellitus with other specified complication: Secondary | ICD-10-CM

## 2023-05-30 DIAGNOSIS — X100XXA Contact with hot drinks, initial encounter: Secondary | ICD-10-CM | POA: Diagnosis not present

## 2023-05-30 DIAGNOSIS — Z5321 Procedure and treatment not carried out due to patient leaving prior to being seen by health care provider: Secondary | ICD-10-CM | POA: Insufficient documentation

## 2023-05-30 DIAGNOSIS — E1159 Type 2 diabetes mellitus with other circulatory complications: Secondary | ICD-10-CM
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
## 2023-05-30 NOTE — Group Note (Deleted)
 Date:  05/30/2023 Time:  9:45 PM  Group Topic/Focus:  Wrap-Up Group:   The focus of this group is to help patients review their daily goal of treatment and discuss progress on daily workbooks.     Participation Level:  {BHH PARTICIPATION EAVWU:98119}  Participation Quality:  {BHH PARTICIPATION QUALITY:22265}  Affect:  {BHH AFFECT:22266}  Cognitive:  {BHH COGNITIVE:22267}  Insight: {BHH Insight2:20797}  Engagement in Group:  {BHH ENGAGEMENT IN JYNWG:95621}  Modes of Intervention:  {BHH MODES OF INTERVENTION:22269}  Additional Comments:  ***  Maglione,Preciosa Bundrick E 05/30/2023, 9:45 PM

## 2023-05-31 ENCOUNTER — Other Ambulatory Visit: Payer: Medicaid Other

## 2023-05-31 DIAGNOSIS — E1069 Type 1 diabetes mellitus with other specified complication: Secondary | ICD-10-CM

## 2023-05-31 DIAGNOSIS — E1159 Type 2 diabetes mellitus with other circulatory complications: Secondary | ICD-10-CM

## 2023-06-01 ENCOUNTER — Ambulatory Visit: Payer: Self-pay | Admitting: Family Medicine

## 2023-06-01 ENCOUNTER — Ambulatory Visit: Payer: Self-pay

## 2023-06-01 LAB — CBC WITH DIFFERENTIAL/PLATELET
Basophils Absolute: 0 10*3/uL (ref 0.0–0.2)
Basos: 0 %
EOS (ABSOLUTE): 0 10*3/uL (ref 0.0–0.4)
Eos: 1 %
Hematocrit: 43.1 % (ref 37.5–51.0)
Hemoglobin: 14.1 g/dL (ref 13.0–17.7)
Immature Grans (Abs): 0.1 10*3/uL (ref 0.0–0.1)
Immature Granulocytes: 1 %
Lymphocytes Absolute: 0.9 10*3/uL (ref 0.7–3.1)
Lymphs: 13 %
MCH: 30.5 pg (ref 26.6–33.0)
MCHC: 32.7 g/dL (ref 31.5–35.7)
MCV: 93 fL (ref 79–97)
Monocytes Absolute: 0.6 10*3/uL (ref 0.1–0.9)
Monocytes: 9 %
Neutrophils Absolute: 4.9 10*3/uL (ref 1.4–7.0)
Neutrophils: 76 %
Platelets: 250 10*3/uL (ref 150–450)
RBC: 4.63 x10E6/uL (ref 4.14–5.80)
RDW: 12.2 % (ref 11.6–15.4)
WBC: 6.4 10*3/uL (ref 3.4–10.8)

## 2023-06-01 LAB — COMPREHENSIVE METABOLIC PANEL WITH GFR
ALT: 14 IU/L (ref 0–44)
AST: 17 IU/L (ref 0–40)
Albumin: 4.2 g/dL (ref 4.1–5.1)
Alkaline Phosphatase: 85 IU/L (ref 44–121)
BUN/Creatinine Ratio: 12 (ref 9–20)
BUN: 11 mg/dL (ref 6–24)
Bilirubin Total: 0.3 mg/dL (ref 0.0–1.2)
CO2: 20 mmol/L (ref 20–29)
Calcium: 8.7 mg/dL (ref 8.7–10.2)
Chloride: 100 mmol/L (ref 96–106)
Creatinine, Ser: 0.94 mg/dL (ref 0.76–1.27)
Globulin, Total: 2.4 g/dL (ref 1.5–4.5)
Glucose: 228 mg/dL — ABNORMAL HIGH (ref 70–99)
Potassium: 4.8 mmol/L (ref 3.5–5.2)
Sodium: 138 mmol/L (ref 134–144)
Total Protein: 6.6 g/dL (ref 6.0–8.5)
eGFR: 102 mL/min/{1.73_m2} (ref >59)

## 2023-06-01 LAB — MICROALBUMIN / CREATININE URINE RATIO
Creatinine, Urine: 37.8 mg/dL
Microalb/Creat Ratio: 18 mg/g{creat} (ref 0–29)
Microalbumin, Urine: 6.9 ug/mL

## 2023-06-01 LAB — LIPID PANEL
Chol/HDL Ratio: 4.1 ratio (ref 0.0–5.0)
Cholesterol, Total: 162 mg/dL (ref 100–199)
HDL: 40 mg/dL (ref >39)
LDL Chol Calc (NIH): 95 mg/dL (ref 0–99)
Triglycerides: 154 mg/dL — ABNORMAL HIGH (ref 0–149)
VLDL Cholesterol Cal: 27 mg/dL (ref 5–40)

## 2023-06-01 LAB — HEMOGLOBIN A1C
Est. average glucose Bld gHb Est-mCnc: 174 mg/dL
Hgb A1c MFr Bld: 7.7 % — ABNORMAL HIGH (ref 4.8–5.6)

## 2023-06-01 LAB — TSH: TSH: 0.928 u[IU]/mL (ref 0.450–4.500)
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
## 2023-06-02 NOTE — Telephone Encounter (Signed)
 Called pt LVM to contact the office

## 2023-06-02 NOTE — Telephone Encounter (Signed)
 If he needs to have it evaluated before I see him in ten days then the urgent care or the ED is the place to go. I can't make any suggestions if I haven't evaluated him. He can keep dressing it and putting the ointment on it in the meantime.

## 2023-06-06 NOTE — Telephone Encounter (Signed)
 Called pt he stated that he is feeling better today

## 2023-06-07 ENCOUNTER — Ambulatory Visit: Payer: Medicaid Other | Admitting: Family Medicine

## 2023-06-12 ENCOUNTER — Ambulatory Visit: Admitting: Family Medicine

## 2023-06-22 ENCOUNTER — Ambulatory Visit: Admitting: Family Medicine

## 2023-06-28 ENCOUNTER — Telehealth: Payer: Self-pay | Admitting: Family Medicine

## 2023-06-28 DIAGNOSIS — E1159 Type 2 diabetes mellitus with other circulatory complications: Secondary | ICD-10-CM

## 2023-06-30 NOTE — Telephone Encounter (Unsigned)
 Copied from CRM [id number]. Topic: Clinical - Medication Refill >> Jun 30, 2023  1:35 PM Tiffini S wrote: Medication: metoprolol  succinate (TOPROL -XL) 100 MG 24 hr tablet  Has the patient contacted their pharmacy? Yes, was told to call the PCP   This is the patient's preferred pharmacy:  [facility] - [place], [place] - [address] [place] [address] Phone: [phone] Fax: [phone]   Is this the correct pharmacy for this prescription? Yes If no, delete pharmacy and type the correct one.   Has the prescription been filled recently? Yes, patient have already sent a refill request with no answer on Wednesday   Is the patient out of the medication? Yes  Has the patient been seen for an appointment in the last year OR does the patient have an upcoming appointment? No  Can we respond through MyChart? No, patient is asking for a call back at [phone]  Agent: Please be advised that Rx refills may take up to 3 business days. We ask that you follow-up with your pharmacy.

## 2023-07-03 ENCOUNTER — Other Ambulatory Visit: Payer: Self-pay

## 2023-07-12 ENCOUNTER — Encounter: Payer: Self-pay | Admitting: Family Medicine

## 2023-07-12 ENCOUNTER — Ambulatory Visit: Admitting: Family Medicine

## 2023-07-12 VITALS — BP 128/86 | HR 86 | Ht 72.0 in | Wt 309.0 lb

## 2023-07-12 DIAGNOSIS — G35 Multiple sclerosis: Secondary | ICD-10-CM

## 2023-07-12 DIAGNOSIS — E782 Mixed hyperlipidemia: Secondary | ICD-10-CM

## 2023-07-12 DIAGNOSIS — E1065 Type 1 diabetes mellitus with hyperglycemia: Secondary | ICD-10-CM

## 2023-07-12 DIAGNOSIS — E1159 Type 2 diabetes mellitus with other circulatory complications: Secondary | ICD-10-CM

## 2023-07-12 DIAGNOSIS — I152 Hypertension secondary to endocrine disorders: Secondary | ICD-10-CM

## 2023-07-12 DIAGNOSIS — E1069 Type 1 diabetes mellitus with other specified complication: Secondary | ICD-10-CM

## 2023-07-12 NOTE — Patient Instructions (Signed)
 It was nice to see you today,  We addressed the following topics today: -Overall your labs look good.  You can discuss your A1c with the endocrinologist next week - When you see your endocrinologist I would like you to asked them if they think it is safe for you to take Wegovy  or Zepbound for weight loss.  Because you are on insulin , this medication could lead to hypoglycemic episodes and you would need to adjust your insulin  dosing. - Continue your current medications.  Have a great day,  Rolan Slain, MD

## 2023-07-12 NOTE — Assessment & Plan Note (Signed)
 Reports recent illness affecting glucose management. Currently using insulin  pump in manual mode with better control than automatic mode. Random glucose elevated at 228, A1C elevated. - Continue current insulin  pump management - Follow up with endocrinologist next week - Discuss glp-1 options for obesity with endocrinologist

## 2023-07-12 NOTE — Assessment & Plan Note (Signed)
-   Continue Zetia  - Cholesterol levels good on recent labs

## 2023-07-12 NOTE — Assessment & Plan Note (Signed)
 Reports home blood pressure monitor inaccurate, cuff size issues. - Continue amlodipine , losartan , metoprolol 

## 2023-07-12 NOTE — Progress Notes (Signed)
   Established Patient Office Visit  Subjective   Patient ID: Michael Hutchinson, male    DOB: Sep 18, 1975  Age: 48 y.o. MRN: [id number]  Chief Complaint  Patient presents with   Medical Management of Chronic Issues    HPI  Subjective - Weight loss concerns, currently 380 pounds, seeking to lose more weight - Watching diet and trying to increase physical activity - Type 1 diabetes managed with insulin  pump in manual mode for better glucose control - Multiple sclerosis with worsening symptoms on current treatment - Chronic foot pain without open lesions - Vision loss in left eye from MS, requiring magnifying glass for close vision in right eye - Recent illness affecting glucose control  Medications: insulin  pump (manual mode), Zetia  for cholesterol, amlodipine , losartan , metoprolol  for blood pressure, Kesimpta injections for MS  PMHx: Type 1 diabetes, multiple sclerosis, hyperlipidemia, hypertension PSH: Not mentioned FH: Not mentioned   Social Hx: Not mentioned  ROS: Reports chronic foot pain, denies open foot lesions, vision loss left eye, difficulty with close vision right eye, reports MS symptoms including heaviness, unsteadiness, gait difficulties    The 10-year ASCVD risk score (Arnett DK, et al., 2019) is: 4.7%  Health Maintenance Due  Topic Date Due   OPHTHALMOLOGY EXAM  Never done   Hepatitis B Vaccines (1 of 3 - 19+ 3-dose series) Never done   Pneumococcal Vaccine 61-3 Years old (2 of 2 - PCV) 01/11/2011   COVID-19 Vaccine (1 - 2024-25 season) Never done   FOOT EXAM  04/04/2023      Objective:     BP 128/86   Pulse 86   Ht 6' (1.829 m)   Wt (!) 309 lb (140.2 kg)   SpO2 98%   BMI 41.91 kg/m    Physical Exam Gen: alert oriented Cv: rrr Pulm: lctab Msk: left bka   No results found for any visits on 07/12/23.      Assessment & Plan:   Type 1 diabetes mellitus with hyperglycemia (HCC) Assessment & Plan: Reports recent illness affecting  glucose management. Currently using insulin  pump in manual mode with better control than automatic mode. Random glucose elevated at 228, A1C elevated. - Continue current insulin  pump management - Follow up with endocrinologist next week - Discuss glp-1 options for obesity with endocrinologist   Multiple sclerosis [facility]) Assessment & Plan: . Reports worsening episodes with heaviness, unsteadiness, and gait difficulties after 2-3 years of treatment. Does not feel ocrevus  is working as well as it used to.  - Consider alternative MS therapy options with neurology - Continue current mgmt.    Morbid (severe) obesity due to excess calories [facility]) Assessment & Plan: , attempting dietary modification and increased activity. - Discussed GLP-1 agonist options (Mounjaro, Wegovy ) for weight loss - pt seeing endocrinologist next week, advised him I would prefer if he discussed it with his endocrinologist first before we agree to start glp1 given his insulin  use and the risk of hypoglycemia.  - Monitor for hypoglycemic episodes if started    Hypertension associated with diabetes [facility]) Assessment & Plan:  Reports home blood pressure monitor inaccurate, cuff size issues. - Continue amlodipine , losartan , metoprolol     Mixed diabetic hyperlipidemia associated with type 1 diabetes mellitus (HCC) Assessment & Plan: - Continue Zetia  - Cholesterol levels good on recent labs      Return in about 6 months (around 01/12/2024) for DM, HTN, hld.    [name], MD

## 2023-07-12 NOTE — Assessment & Plan Note (Signed)
,   attempting dietary modification and increased activity. - Discussed GLP-1 agonist options (Mounjaro, Wegovy ) for weight loss - pt seeing endocrinologist next week, advised him I would prefer if he discussed it with his endocrinologist first before we agree to start glp1 given his insulin  use and the risk of hypoglycemia.  - Monitor for hypoglycemic episodes if started

## 2023-07-12 NOTE — Assessment & Plan Note (Signed)
.   Reports worsening episodes with heaviness, unsteadiness, and gait difficulties after 2-3 years of treatment. Does not feel ocrevus  is working as well as it used to.  - Consider alternative MS therapy options with neurology - Continue current mgmt.

## 2023-07-19 ENCOUNTER — Ambulatory Visit: Admitting: Family Medicine

## 2023-07-20 ENCOUNTER — Encounter (HOSPITAL_COMMUNITY): Payer: Self-pay | Admitting: *Deleted

## 2023-07-25 ENCOUNTER — Encounter: Payer: Self-pay | Admitting: Family Medicine

## 2023-07-25 ENCOUNTER — Other Ambulatory Visit: Payer: Self-pay | Admitting: Family Medicine

## 2023-07-25 MED ORDER — WEGOVY 0.25 MG/0.5ML ~~LOC~~ SOAJ: 0.2500 mg | SUBCUTANEOUS | 1 refills | Status: DC

## 2023-07-26 ENCOUNTER — Telehealth: Payer: Self-pay

## 2023-07-26 NOTE — Telephone Encounter (Signed)
 Fax received  from [facility] For Ocrevus  new script needed.     Fax signed and fax back to [phone].

## 2023-08-22 NOTE — Progress Notes (Signed)
 NEUROLOGY FOLLOW UP OFFICE NOTE  Michael Hutchinson [id number]  Assessment/Plan:   Multiple sclerosis:  While ischemic cranial nerve palsies are seen in uncontrolled diabetes, given the 3 distinct episodes of various cranial nerve palsies, the appearance of white matter lesions on brain MRI, and CSF analysis demonstrated for oligoclonal bands not present in serum, I suspect MS.  He does have significantly elevated CSF protein but does not exhibit any sign of infection.  Diabetes may be contributor to this. Possible episodic cluster headaches Asymmetric pupils, clouding of left pupil - suspect diabetic-related but also possibly a cataract?  He denies routine eye exams and given his uncontrolled type 1 diabetes, he should be followed by ophthalmology anyway. Type 1 diabetes mellitus     DMT:  Due to significant wearing off prior to next Ocrevus  infusion, plan to switch to Kesimpta .  He will start next month when he is supposed to receive his next infusion. Check MRI of brain with and without contrast right after receiving first dose of Kesimpta  Check immunoglobulin levels and vit D today Follow up in 6 months.  Total time spent in chart and face to face with patient and his wife:  46 minutes.  Subjective:  Michael Hutchinson is a 48 year old right-handed Caucasian man with type 1 diabetes with history of DKA, hypertension, Charcot's joint of foot, left below the knee amputation, hyperlipidemia and GERD and multiple sclerosis who follows up for MS.  UPDATE: Current disease modifying therapy: Ocrevus  (last infusion was in March) Other current medications: D3 6000 IU daily (not taking)  Did not show up for MRI with contrast.  However, he has been doing well.  He reports that he feels great for the first 4 months after an infusion but then has excessive fatigue, weakness and difficulty ambulating for the last 2 months.  It disrupts his quality of life.    Headache stable.  03/07/2023 LABS:   IgG 878, IgM 34, IgA 249; vit D 38 06/10/2023 LABS:  CBC with WBC 6.4, HGB 14.1, HCT 43.1, CMP with Na 138, K 4.8, Cl 100, CO2 20, glucose 228, BUN 11, Cr 0.94, t bili 0.3, ALP 85, AST 17, ALT 14; PLT 250, ALC 0.9; Hgb A1c 7.7  HISTORY: Multiple sclerosis: In October 2016, he developed a right 6th cranial nerve palsy which was thought to be due to his uncontrolled type 1 diabetes.   In March 2017, he developed blurred vision in the left eye.  He saw ophthalmologist Dr. Prentice Mandes who diagnosed optic neuritis.  MRI of the brain and orbits with and without contrast from 05/11/2015 revealed 2 T2 hyperintense lesions within the left hemisphere adjacent to the posterior horn of the left lateral ventricle , a nonspecific finding and without abnormal contrast enhancement.   In March 2019, he developed disconjugate gaze and droopy left eyelid.  He saw the eye doctor who diagnosed a left 3rd nerve palsy.  He was evaluated in the ED at [facility] on 03/28/2017.  CTA of the head was negative for aneurysm.  MRI of the brain with and without contrast demonstrated multiple T2/flair hyperintense foci involving the periventricular, deep and subcortical white matter, progressed compared to prior MRI from 2017.  In-house neurology believed his findings were more likely due to a diabetic 3rd nerve palsy rather than MS.  He was advised to start aspirin  81 mg daily and follow-up with outpatient neurology.  He subsequently underwent a work-up for multiple sclerosis.  MRI of the cervical  spine with and without contrast from 05/04/2017 revealed no cord lesions.  He underwent lumbar puncture on 05/19/2017 for CSF analysis which revealed elevated IgG index of 0.79 and 4 oligoclonal bands not present in the corresponding serum.  Cell count was 1, elevated protein 157, elevated glucose 100, negative myelin basic protein, ACE 4, Gram stain and culture negative.   Past disease modifying therapy: Tecfidera (GI upset), Ocrevus   (hives but declined premedicated Benadryl )  Transient slurred speech: On 02/25/2023, he developed slurred speech, lethargy and generalized weakness.  He couldn't stand.  He felt drunk and was drinking alcohol but not excessive.  He had been in pain due to a tooth abscess and is taking antibiotics.   EMS recorded his blood glucose in the 400s and his wife gave him 12 units of insulin .  Brought to [facility]  Notes mention that he had left sided weakness however both patient and his wife dispute this.  She didn't notice any facial droop but his face was swollen due to the dental abscess.  Exam in the ED did not demonstrate any unilateral weakness, however it is reported that he endorsed left sided subjective diminished sensation on the left.  Glucose 335 in ED with bicarb 21 and anion gap of 17.  EtOH level was 316.  CT head was negative for acute infarct, as was MRI of brain without contrast revealed moderate chronic white matter disease but no acute infarct.  MRA of head negative for LVO or hemodynamically significant stenosis.  Carotid US  showed some acceleration in the left ICA favored to be secondary to tortuosity and kink rather than stenosis.  Hgb A1c was 7.  Discharged on ASA and Plavix  for 21 days, followed by Plavix  alone.  He was set up for outpatient cardiac workup as well.  My suspicion was that his symptoms were due to metabolic encephalopathy related to his hyperglycemia/DKA while on antibiotics and alcohol rather than TIA so antiplatelet therapy was discontinued and cardiac workup was cancelled.   Imaging: 05/11/2015 MRI BRAIN W WO:  Two T2 hyperintense lesions within the left hemisphere adjacent to the posterior horn of the left lateral ventricle. The finding is nonspecific but can be seen in the setting of chronic microvascular ischemia, a demyelinating process such as multiple sclerosis, vasculitis, complicated migraine headaches, or as the sequelae of a prior infectious or inflammatory  process. 05/11/2015 MRI ORBITS W WO:  Within normal limits 03/28/2017 MRI BRAIN & ORBITS W WO:  1. Multiple T2/FLAIR hyperintense foci involving the periventricular, deep, and subcortical white matter of both cerebral hemispheres in a distribution highly suspicious for possible demyelinating disease/multiple sclerosis. Changes have progressed relative to 2017. No evidence for active demyelination.  2. Negative MRI of the orbits. No findings to suggest acute optic neuritis or other abnormality identified. 3. Bilateral mastoid effusions, left greater than right. 05/04/2017 MRI CERVICAL SPINE W WO:  Normal 02/25/2018 MRI BRAIN W WO: No change since the study of March 2019. Multiple foci of abnormal T2 and FLAIR signal within the cerebral hemispheric deep and subcortical white matter, consistent with the clinical diagnosis of multiple sclerosis. No new or progressive lesions. No lesions show contrast enhancement or restricted diffusion. 02/25/2023 CT HEAD WO:  1. No evidence of acute intracranial abnormality. ASPECTS is 10. 2. Chronic microvascular ischemic disease and remote appearing corona radiata lacunar infarcts. 3. Left globe calcifications and vitreous hyperdensities, presumably the sequela of prior insult. Correlate with ophthalmologic history and funduscopic exam if clinically warranted. 02/26/2023 MRI BRAIN WO:  1. No acute finding including infarct. 2. Moderate chronic white matter disease that is progressed from a 2019 comparison. Small vessel disease and/or chronic demyelination could give this appearance. 02/26/2023 MRA HEAD WO:  The covered intracranial arteries are unremarkable.  02/26/2023 US  CAROTID:  1. Some acceleration in the left ICA favored secondary to ICA tortuosity and kink, no evidence of underlying plaque at this level. 2. Otherwise unremarkable.  PAST MEDICAL HISTORY: Past Medical History:  Diagnosis Date   Charcot's joint of foot 11/25/2013   Complication of anesthesia    I  wake up angry (12/31/2015)   Diabetes mellitus type 1 (HCC) dx'd 1981   Diabetic ketoacidosis (HCC)    Essential hypertension 05/03/2013   GERD (gastroesophageal reflux disease)    High cholesterol    Hx MRSA infection    Inner thigh and under arm- healed areas   Meniscus tear     MEDICATIONS: Current Outpatient Medications on File Prior to Visit  Medication Sig Dispense Refill   acetone, urine, test strip 1 strip by Does not apply route as needed for high blood sugar. 50 each 0   albuterol  (VENTOLIN  HFA) 108 (90 Base) MCG/ACT inhaler Inhale 2 puffs into the lungs every 6 (six) hours as needed for wheezing or shortness of breath. 8 g 2   amLODipine  (NORVASC ) 5 MG tablet Take 1 tablet (5 mg total) by mouth daily. 90 tablet 3   Blood Pressure Monitoring (BLOOD PRESSURE MONITOR/L CUFF) MISC Use blood pressure cuff and monitor to check blood pressure at home at least several times per week if not daily. 1 each 0   cetirizine  (ZYRTEC  ALLERGY) 10 MG tablet Take 1 tablet (10 mg total) by mouth daily as needed for allergies. Home med.     Continuous Blood Gluc Receiver (DEXCOM G6 RECEIVER) DEVI See admin instructions.     Continuous Blood Gluc Sensor (DEXCOM G6 SENSOR) MISC SMARTSIG:1 Topical Every 10 Days     Continuous Blood Gluc Transmit (DEXCOM G6 TRANSMITTER) MISC DISPENSE AND USE AS DIRECTED     glucose blood (BAYER CONTOUR NEXT TEST) test strip 1 each by Other route 4 (four) times daily. And lancets 4/day 400 each 3   HUMALOG  KWIKPEN 200 UNIT/ML KwikPen 1 unit per 12 grams of carbs, and Novolog  1 unit per every 50 mg/dl above target glucose.  While off insulin  pump.     HYDROcodone -acetaminophen  (NORCO/VICODIN) 5-325 MG tablet Take 1 tablet by mouth every 4 (four) hours as needed. 30 tablet 0   Insulin  Disposable Pump (OMNIPOD 5 G6 PODS, GEN 5,) MISC Inject 1 Device into the skin daily.     insulin  glargine (LANTUS  SOLOSTAR) 100 UNIT/ML Solostar Pen Inject 20 Units into the skin daily.  While off insulin  pump. (Patient taking differently: Inject 30 Units into the skin daily. While off insulin  pump.) 3 mL 2   Insulin  Pen Needle (PEN NEEDLES) 32G X 4 MM MISC USE 5 TIMES DAILY AS DIRECTED     losartan  (COZAAR ) 50 MG tablet TAKE 1 TABLET BY MOUTH ONCE DAILY 90 tablet 1   metoprolol  succinate (TOPROL -XL) 100 MG 24 hr tablet TAKE 1 TABLET (100 MG TOTAL) BY MOUTH DAILY. TAKE WITH OR IMMEDIATELY FOLLOWING A MEAL. 90 tablet 1   Multiple Vitamins-Minerals (MULTIVITAMIN WITH MINERALS) tablet Take 1 tablet by mouth daily.     OCREVUS  300 MG/10ML injection Inject 300 mg into the vein every 6 (six) months.     ondansetron  (ZOFRAN -ODT) 4 MG disintegrating tablet Take 1 tablet (4  mg total) by mouth every 8 (eight) hours as needed for nausea or vomiting. 12 tablet 0   rizatriptan  (MAXALT -MLT) 10 MG disintegrating tablet Take 1 tablet earliest onset of headache.  May repeat after 2 hours if needed. Maximum 2 tablets in 24 hours 10 tablet 5   Semaglutide -Weight Management (WEGOVY ) 0.25 MG/0.5ML SOAJ Inject 0.25 mg into the skin once a week. 2 mL 1   No current facility-administered medications on file prior to visit.    ALLERGIES: Allergies  Allergen Reactions   Influenza Vac Split Quad Nausea And Vomiting   Influenza Virus Vaccine Split Nausea And Vomiting   Statins Other (See Comments)    Myalgias   Hydrochlorothiazide  Other (See Comments)    Dizziness   Lisinopril Cough    FAMILY HISTORY: Family History  Problem Relation Age of Onset   Other Mother    Cancer Mother        Breast / Bone   Heart attack Father    Hypertension Father    Hyperlipidemia Father    Diabetes Other    Alcohol abuse Sister    Diabetes Maternal Grandfather    Stroke Paternal Grandmother    Alcohol abuse Paternal Grandfather       Objective:  Blood pressure 127/85, pulse 97, height 6' (1.829 m), weight (!) 303 lb (137.4 kg), SpO2 98%. General: No acute distress.  Patient appears well-groomed.    Head:  Normocephalic/atraumatic Eyes:  Fundi examined but not visualized Neck: supple, no paraspinal tenderness, full range of motion Heart:  Regular rate and rhythm Neurological Exam: alert and oriented.  Speech fluent and not dysarthric, language intact.  Asymmetric pupils (OD > OS), OS poorly reactive with clouding.  Otherwise, CN II-XII intact. Bulk and tone normal, muscle strength 5/5 throughout (left BTK amputation).  Sensation to light touch intact.  Deep tendon reflexes absent throughout, toes downgoing.  Finger to nose testing intact.  Gait stable   Juliene Dunnings, DO    CC: Toribio Slain, MD

## 2023-08-23 ENCOUNTER — Other Ambulatory Visit

## 2023-08-23 ENCOUNTER — Encounter: Payer: Self-pay | Admitting: Neurology

## 2023-08-23 ENCOUNTER — Ambulatory Visit (INDEPENDENT_AMBULATORY_CARE_PROVIDER_SITE_OTHER): Payer: Medicaid Other | Admitting: Neurology

## 2023-08-23 VITALS — BP 127/85 | HR 97 | Ht 72.0 in | Wt 303.0 lb

## 2023-08-23 DIAGNOSIS — G44019 Episodic cluster headache, not intractable: Secondary | ICD-10-CM | POA: Diagnosis not present

## 2023-08-23 DIAGNOSIS — G35 Multiple sclerosis: Secondary | ICD-10-CM

## 2023-08-23 DIAGNOSIS — E559 Vitamin D deficiency, unspecified: Secondary | ICD-10-CM

## 2023-08-23 NOTE — Patient Instructions (Addendum)
 Plan to switch from Ocrevus  to Kesimpta  monthly injection.  Start in September when you would normally get your next infusion Check MRI of brain with and without contrast right after starting Kesimpta  Check immunoglobulin panel and vit D today.

## 2023-08-24 ENCOUNTER — Ambulatory Visit: Payer: Self-pay | Admitting: Neurology

## 2023-08-24 LAB — VITAMIN D 25 HYDROXY (VIT D DEFICIENCY, FRACTURES): Vit D, 25-Hydroxy: 45 ng/mL (ref 30–100)

## 2023-08-24 LAB — IGG: IgG (Immunoglobin G), Serum: 966 mg/dL (ref 600–1640)

## 2023-08-28 ENCOUNTER — Telehealth: Payer: Self-pay

## 2023-08-28 ENCOUNTER — Other Ambulatory Visit (HOSPITAL_COMMUNITY): Payer: Self-pay

## 2023-08-28 DIAGNOSIS — G35 Multiple sclerosis: Secondary | ICD-10-CM

## 2023-08-28 MED ORDER — KESIMPTA 20 MG/0.4ML ~~LOC~~ SOAJ: SUBCUTANEOUS | 0 refills | Status: DC

## 2023-08-28 MED ORDER — KESIMPTA 20 MG/0.4ML ~~LOC~~ SOAJ
SUBCUTANEOUS | 2 refills | Status: AC
Start: 2023-08-28 — End: ?

## 2023-08-28 MED ORDER — KESIMPTA 20 MG/0.4ML ~~LOC~~ SOAJ: SUBCUTANEOUS | 0 refills | Status: AC

## 2023-08-28 MED ORDER — KESIMPTA 20 MG/0.4ML ~~LOC~~ SOAJ: SUBCUTANEOUS | 2 refills | Status: DC

## 2023-08-28 NOTE — Telephone Encounter (Signed)
 Need script for strength/dosing instructions

## 2023-08-28 NOTE — Telephone Encounter (Signed)
PA needed for Kesimpta  

## 2023-08-30 ENCOUNTER — Other Ambulatory Visit (HOSPITAL_COMMUNITY): Payer: Self-pay

## 2023-09-16 ENCOUNTER — Other Ambulatory Visit: Payer: Self-pay | Admitting: Family Medicine

## 2023-09-16 DIAGNOSIS — E1159 Type 2 diabetes mellitus with other circulatory complications: Secondary | ICD-10-CM

## 2023-09-19 ENCOUNTER — Other Ambulatory Visit (HOSPITAL_COMMUNITY): Payer: Self-pay

## 2023-09-19 ENCOUNTER — Telehealth: Payer: Self-pay

## 2023-09-19 NOTE — Telephone Encounter (Signed)
 Pharmacy Patient Advocate Encounter   Received notification from Pt Calls Messages that prior authorization for Kesimpta  20MG /0.4ML auto-injectors is required/requested.   Insurance verification completed.   The patient is insured through [employer] .   Per test claim:  Prior Authorization is not required at this time. Pharmacy needs to submit override codes for Drug Utilization Review.

## 2023-10-02 ENCOUNTER — Other Ambulatory Visit: Payer: Self-pay | Admitting: Family Medicine

## 2023-10-25 ENCOUNTER — Other Ambulatory Visit (HOSPITAL_COMMUNITY): Payer: Self-pay

## 2023-10-26 ENCOUNTER — Telehealth: Payer: Self-pay

## 2023-10-26 ENCOUNTER — Other Ambulatory Visit (HOSPITAL_COMMUNITY): Payer: Self-pay

## 2023-10-26 NOTE — Telephone Encounter (Signed)
 Information has been forwarded to [facility] rep.

## 2023-10-26 NOTE — Telephone Encounter (Signed)
 Email received from [facility] In regards to patient Kesimpta    Hello,   We are unable to reach patient or locate active insurance coverage. Please provide active coverage information so we may process KESIMPTA  PEN 20MG /0.4ML. Please provide the patient's current phone number or front and back of insurance ID card so we may update our records.   Patient: Michael Hutchinson DOB: [birth year] Drug:KESIMPTA  PEN 20MG /0.4ML Prescriber: ADAM JAFFE NPI: [id number]   If you would like to update your Prescriber's preferences for specialty communications and service offerings, please call us  at [phone], or visit our Pharmacy Provider Portal at [email or web address].   Michael Hutchinson was added to the conversation to check the status.   Email from [place] with PA team, No active coverage was found for this patient. The only contact number for the patient I am showing is [phone].   Raina Lee, CPhT Pharmacy Patient Advocate Specialist Certified Medication History Technician Phone/Jabber: [phone] Fax:[phone]       Telephone call to patient, he advised his MDCD changed. Instead, he has regular MDCD not [employer]. Member I.D# [id number] S

## 2023-10-30 ENCOUNTER — Other Ambulatory Visit: Payer: Self-pay | Admitting: Family Medicine

## 2023-10-30 DIAGNOSIS — E1159 Type 2 diabetes mellitus with other circulatory complications: Secondary | ICD-10-CM

## 2023-11-01 ENCOUNTER — Other Ambulatory Visit: Payer: Self-pay

## 2023-11-07 ENCOUNTER — Telehealth: Payer: Self-pay

## 2023-11-07 NOTE — Telephone Encounter (Signed)
 Telephone call to [facility] pharmacy

## 2023-11-22 NOTE — Telephone Encounter (Signed)
 Able to get Medicaid pharmacy call center on the phone [phone].   Patient PCN: [id number], PLEASANT: [id number].   Telephone call to Natalie [phone] Information above given to rep: Sanjida  she will try and run claim.   Claim went through with $4 copay, Patient to call to set up delivery. [phone].     Patient advised.

## 2023-11-23 ENCOUNTER — Telehealth: Payer: Self-pay | Admitting: Pharmacy Technician

## 2023-11-23 ENCOUNTER — Other Ambulatory Visit (HOSPITAL_COMMUNITY): Payer: Self-pay

## 2023-11-23 NOTE — Telephone Encounter (Signed)
 Pharmacy Patient Advocate Encounter   Received notification from Onbase that prior authorization for Wegovy  0.25MG /0.5ML auto-injectors is required/requested.   Insurance verification completed.   The patient is insured through [facility] ADVANT[employer] ADV[employer].   Medication is excluded from Medicare Part D when used for weight loss alone.  Does pt have evidence of any of the following: Myocardial infarction (MI), (2) Prior ischemic or hemorrhagic stroke, (3) Symptomatic peripheral arterial disease (PAD) as evidence by one of the following (a) Intermittent claudication with ankle-brachial index (ABI) less than 0.85 (at rest); (b) Peripheral arterial revascularization procedure; (c) Amputation due to atherosclerotic disease?

## 2023-11-27 NOTE — Telephone Encounter (Signed)
 I do not believe he has had MI or stroke.  Has a Left leg amputation but this was due to nonhealing, infected foot ulceration.  Peripheral artery disease may have contributed but there are no abi's documented and type 1 diabetes was the main cause of the amputation.

## 2024-01-02 ENCOUNTER — Other Ambulatory Visit: Payer: Self-pay

## 2024-01-02 DIAGNOSIS — E782 Mixed hyperlipidemia: Secondary | ICD-10-CM

## 2024-01-03 ENCOUNTER — Other Ambulatory Visit: Payer: Self-pay

## 2024-01-05 ENCOUNTER — Other Ambulatory Visit: Payer: Self-pay

## 2024-01-05 DIAGNOSIS — I152 Hypertension secondary to endocrine disorders: Secondary | ICD-10-CM

## 2024-01-08 ENCOUNTER — Other Ambulatory Visit: Payer: Self-pay | Admitting: Family Medicine

## 2024-01-08 DIAGNOSIS — E1069 Type 1 diabetes mellitus with other specified complication: Secondary | ICD-10-CM

## 2024-01-10 ENCOUNTER — Other Ambulatory Visit: Payer: Self-pay | Admitting: Family Medicine

## 2024-01-10 MED ORDER — EZETIMIBE 10 MG PO TABS: 10.0000 mg | ORAL_TABLET | Freq: Every day | ORAL | 3 refills | Status: AC

## 2024-01-12 ENCOUNTER — Encounter: Payer: Self-pay | Admitting: Family Medicine

## 2024-01-12 ENCOUNTER — Ambulatory Visit: Admitting: Family Medicine

## 2024-01-12 VITALS — BP 138/86 | HR 82 | Ht 72.0 in | Wt 321.1 lb

## 2024-01-12 DIAGNOSIS — E1065 Type 1 diabetes mellitus with hyperglycemia: Secondary | ICD-10-CM | POA: Diagnosis not present

## 2024-01-12 DIAGNOSIS — I152 Hypertension secondary to endocrine disorders: Secondary | ICD-10-CM | POA: Diagnosis not present

## 2024-01-12 DIAGNOSIS — Z89512 Acquired absence of left leg below knee: Secondary | ICD-10-CM | POA: Diagnosis not present

## 2024-01-12 DIAGNOSIS — G35D Multiple sclerosis, unspecified: Secondary | ICD-10-CM

## 2024-01-12 DIAGNOSIS — E1159 Type 2 diabetes mellitus with other circulatory complications: Secondary | ICD-10-CM

## 2024-01-12 MED ORDER — ALBUTEROL SULFATE HFA 108 (90 BASE) MCG/ACT IN AERS: 2.0000 | INHALATION_SPRAY | Freq: Four times a day (QID) | RESPIRATORY_TRACT | 2 refills | Status: AC | PRN

## 2024-01-12 NOTE — Patient Instructions (Signed)
 It was nice to see you today,  We addressed the following topics today: - if you have not heard from anyone regarding your ophthalmology referral in 4 weeks please let us  know.    Have a great day,  Rolan Slain, MD

## 2024-01-12 NOTE — Progress Notes (Signed)
 "  Established Patient Office Visit  Subjective   Patient ID: Michael Hutchinson, male    DOB: 15-Apr-1975  Age: 49 y.o. MRN: [id number]  Chief Complaint  Patient presents with   Medical Management of Chronic Issues     History of Present Illness   Michael Hutchinson is a 49 year old male with multiple sclerosis and diabetes who presents for a routine follow-up visit.  He has no new issues or concerns since his last visit. He continues to use his insulin  pump and is scheduled to see his endocrinologist on the 29th. He is still taking Kesimpta  injections for multiple sclerosis, which he states are working 'fabulously'.  His main symptoms from multiple sclerosis include fatigue and generalized achiness, which previously made walking difficult. He notes improvement in these symptoms,.. 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 He does not currently see an ophthalmologist.  His medication list was reviewed, and he confirmed he is no longer taking Ocrevus , having switched to Kesimpta . He also mentioned he does not currently have a prescription for albuterol , which he has not taken in a long time due to lack of prescription. He wants it occasionally.  Regarding his diabetes management, he has switched from Dexcom G6 to G7 for continuous glucose monitoring.  He previously used Wegovy  for weight management, but his insurance stopped covering it. He has dual coverage with Medicare and Medicaid, and there is uncertainty about whether it Hutchinson be covered again. He was initially prescribed Wegovy  for obesity, but he has not been diagnosed with sleep apnea or coronary artery disease, which are conditions that might support coverage under Medicare.  He has a history of Charcot foot, which led to an amputation.  He has not been diagnosed with peripheral artery disease.         The 10-year ASCVD risk  score (Arnett DK, et al., 2019) is: 5.4%  Health Maintenance Due  Topic Date Due   Medicare Annual Wellness (AWV)  Never done   COVID-19 Vaccine (1) Never done   OPHTHALMOLOGY EXAM  Never done   Hepatitis B Vaccines 19-59 Average Risk (1 of 3 - 19+ 3-dose series) Never done   FOOT EXAM  04/04/2023   HEMOGLOBIN A1C  12/01/2023      Objective:     BP 138/86   Pulse 82   Ht 6' (1.829 m)   Wt (!) 321 lb 1.9 oz (145.7 kg)   SpO2 100%   BMI 43.55 kg/m    Physical Exam     Gen: alert, oriented Pulm: no respiratory distress Ext: left BKA w/ prosthesis.  Psych: pleasant affect       No results found for any visits on 01/12/24.      Assessment & Plan:   Type 1 diabetes mellitus with hyperglycemia (HCC) Assessment & Plan: Managed with insulin  pump and continuous glucose monitoring. High risk for diabetic retinopathy. - Continue current insulin  pump therapy. - Referred to ophthalmology for diabetic retinopathy screening.  Orders: -     Ambulatory referral to Ophthalmology  Morbid (severe) obesity due to excess calories [facility]) Assessment & Plan: Insurance coverage issues with Wegovy . Medicaid coverage for GLP-1 agonists reinstated, but claim processing delayed. - Investigate insurance coverage for Wegovy  and attempt to reinstate prescription if coverage is confirmed.   Hypertension associated with diabetes (HCC) Assessment & Plan: Continue amlodipine , losartan , metoprolol    MS (multiple  sclerosis) Assessment & Plan: Managed with Kesimpta  injections, effective. Ocrevus  discontinued due to side effects. - Continue Kesimpta  injections.   Acquired absence of left leg below knee [facility]) Assessment & Plan: 2/2 charcot foot and osteomyelitis in 2017.  Stable.    Other orders -     Albuterol  Sulfate HFA; Inhale 2 puffs into the lungs every 6 (six) hours as needed for wheezing or shortness of breath.  Dispense: 18 g; Refill: 2               Return in about  6 months (around 07/11/2024) for DM.    Michael MARLA Slain, MD  "

## 2024-01-13 NOTE — Assessment & Plan Note (Signed)
 2/2 charcot foot and osteomyelitis in 2017.  Stable.

## 2024-01-13 NOTE — Assessment & Plan Note (Signed)
 Managed with Kesimpta  injections, effective. Ocrevus  discontinued due to side effects. - Continue Kesimpta  injections.

## 2024-01-13 NOTE — Assessment & Plan Note (Signed)
-

## 2024-01-13 NOTE — Assessment & Plan Note (Signed)
 Insurance coverage issues with Wegovy . Medicaid coverage for GLP-1 agonists reinstated, but claim processing delayed. - Investigate insurance coverage for Wegovy  and attempt to reinstate prescription if coverage is confirmed.

## 2024-01-13 NOTE — Assessment & Plan Note (Signed)
 Managed with insulin  pump and continuous glucose monitoring. High risk for diabetic retinopathy. - Continue current insulin  pump therapy. - Referred to ophthalmology for diabetic retinopathy screening.

## 2024-02-26 ENCOUNTER — Ambulatory Visit: Payer: Medicaid Other | Admitting: Family Medicine

## 2024-02-28 ENCOUNTER — Ambulatory Visit: Admitting: Neurology

## 2024-07-11 ENCOUNTER — Ambulatory Visit: Admitting: Family Medicine
